# Patient Record
Sex: Female | Born: 1940 | Race: White | Hispanic: No | Smoking: Former smoker
Health system: Southern US, Community
[De-identification: ages and names within clinical notes are randomized; demographics above are authoritative.]

## PROBLEM LIST (undated history)

## (undated) DIAGNOSIS — M1990 Unspecified osteoarthritis, unspecified site: Secondary | ICD-10-CM

## (undated) DIAGNOSIS — M069 Rheumatoid arthritis, unspecified: Secondary | ICD-10-CM

## (undated) DIAGNOSIS — T4145XA Adverse effect of unspecified anesthetic, initial encounter: Secondary | ICD-10-CM

## (undated) DIAGNOSIS — M797 Fibromyalgia: Secondary | ICD-10-CM

## (undated) DIAGNOSIS — I2699 Other pulmonary embolism without acute cor pulmonale: Secondary | ICD-10-CM

## (undated) DIAGNOSIS — G4730 Sleep apnea, unspecified: Secondary | ICD-10-CM

## (undated) DIAGNOSIS — E119 Type 2 diabetes mellitus without complications: Secondary | ICD-10-CM

## (undated) DIAGNOSIS — T8859XA Other complications of anesthesia, initial encounter: Secondary | ICD-10-CM

## (undated) HISTORY — DX: Fibromyalgia: M79.7

## (undated) HISTORY — DX: Rheumatoid arthritis, unspecified: M06.9

## (undated) HISTORY — PX: TOTAL KNEE ARTHROPLASTY: SHX125

## (undated) HISTORY — PX: ABDOMINAL HYSTERECTOMY: SHX81

## (undated) HISTORY — PX: BREAST SURGERY: SHX581

## (undated) HISTORY — PX: FOOT SURGERY: SHX648

---

## 1968-03-11 HISTORY — PX: PARTIAL HYSTERECTOMY: SHX80

## 1997-09-30 ENCOUNTER — Ambulatory Visit (HOSPITAL_COMMUNITY): Admission: RE | Admit: 1997-09-30 | Discharge: 1997-09-30 | Payer: Self-pay

## 1998-01-10 ENCOUNTER — Encounter: Admission: RE | Admit: 1998-01-10 | Discharge: 1998-03-16 | Payer: Self-pay | Admitting: Internal Medicine

## 1998-03-14 ENCOUNTER — Encounter: Payer: Self-pay | Admitting: Orthopedic Surgery

## 1998-09-11 ENCOUNTER — Ambulatory Visit (HOSPITAL_COMMUNITY): Admission: RE | Admit: 1998-09-11 | Discharge: 1998-09-11 | Payer: Self-pay | Admitting: Gastroenterology

## 1998-09-11 ENCOUNTER — Encounter (INDEPENDENT_AMBULATORY_CARE_PROVIDER_SITE_OTHER): Payer: Self-pay | Admitting: Specialist

## 1998-10-03 ENCOUNTER — Encounter: Payer: Self-pay | Admitting: Internal Medicine

## 1998-10-04 ENCOUNTER — Inpatient Hospital Stay (HOSPITAL_COMMUNITY): Admission: EM | Admit: 1998-10-04 | Discharge: 1998-10-05 | Payer: Self-pay | Admitting: Internal Medicine

## 1998-10-04 ENCOUNTER — Encounter: Payer: Self-pay | Admitting: Gastroenterology

## 1999-11-26 ENCOUNTER — Encounter: Payer: Self-pay | Admitting: Family Medicine

## 1999-11-26 ENCOUNTER — Encounter: Admission: RE | Admit: 1999-11-26 | Discharge: 1999-11-26 | Payer: Self-pay | Admitting: Family Medicine

## 2000-04-07 ENCOUNTER — Encounter: Admission: RE | Admit: 2000-04-07 | Discharge: 2000-04-07 | Payer: Self-pay | Admitting: Family Medicine

## 2000-04-07 ENCOUNTER — Encounter: Payer: Self-pay | Admitting: Family Medicine

## 2000-04-15 ENCOUNTER — Encounter: Admission: RE | Admit: 2000-04-15 | Discharge: 2000-04-15 | Payer: Self-pay | Admitting: Neurological Surgery

## 2000-04-15 ENCOUNTER — Encounter: Payer: Self-pay | Admitting: Neurological Surgery

## 2000-08-08 ENCOUNTER — Encounter (HOSPITAL_COMMUNITY): Admission: RE | Admit: 2000-08-08 | Discharge: 2000-11-06 | Payer: Self-pay | Admitting: Rheumatology

## 2000-11-27 ENCOUNTER — Encounter: Admission: RE | Admit: 2000-11-27 | Discharge: 2000-11-27 | Payer: Self-pay | Admitting: Family Medicine

## 2000-11-27 ENCOUNTER — Encounter: Payer: Self-pay | Admitting: Family Medicine

## 2001-10-08 ENCOUNTER — Inpatient Hospital Stay (HOSPITAL_COMMUNITY): Admission: AD | Admit: 2001-10-08 | Discharge: 2001-10-23 | Payer: Self-pay | Admitting: Orthopedic Surgery

## 2001-10-08 ENCOUNTER — Encounter (INDEPENDENT_AMBULATORY_CARE_PROVIDER_SITE_OTHER): Payer: Self-pay | Admitting: Specialist

## 2001-10-08 ENCOUNTER — Encounter: Payer: Self-pay | Admitting: Orthopedic Surgery

## 2002-03-18 ENCOUNTER — Encounter: Admission: RE | Admit: 2002-03-18 | Discharge: 2002-03-18 | Payer: Self-pay | Admitting: Family Medicine

## 2002-03-18 ENCOUNTER — Encounter: Payer: Self-pay | Admitting: Family Medicine

## 2002-07-26 ENCOUNTER — Encounter: Admission: RE | Admit: 2002-07-26 | Discharge: 2002-07-26 | Payer: Self-pay | Admitting: Infectious Diseases

## 2002-08-25 ENCOUNTER — Encounter: Payer: Self-pay | Admitting: Infectious Diseases

## 2002-08-25 ENCOUNTER — Ambulatory Visit (HOSPITAL_COMMUNITY): Admission: RE | Admit: 2002-08-25 | Discharge: 2002-08-25 | Payer: Self-pay | Admitting: Infectious Diseases

## 2002-09-14 ENCOUNTER — Encounter: Admission: RE | Admit: 2002-09-14 | Discharge: 2002-09-14 | Payer: Self-pay | Admitting: Infectious Diseases

## 2002-09-16 ENCOUNTER — Encounter: Payer: Self-pay | Admitting: Orthopedic Surgery

## 2002-09-20 ENCOUNTER — Inpatient Hospital Stay (HOSPITAL_COMMUNITY): Admission: RE | Admit: 2002-09-20 | Discharge: 2002-09-23 | Payer: Self-pay | Admitting: Orthopedic Surgery

## 2002-09-23 ENCOUNTER — Inpatient Hospital Stay (HOSPITAL_COMMUNITY)
Admission: RE | Admit: 2002-09-23 | Discharge: 2002-09-28 | Payer: Self-pay | Admitting: Physical Medicine & Rehabilitation

## 2002-10-27 ENCOUNTER — Encounter: Admission: RE | Admit: 2002-10-27 | Discharge: 2002-10-27 | Payer: Self-pay | Admitting: Infectious Diseases

## 2002-11-22 ENCOUNTER — Inpatient Hospital Stay (HOSPITAL_COMMUNITY): Admission: RE | Admit: 2002-11-22 | Discharge: 2002-11-25 | Payer: Self-pay | Admitting: Orthopedic Surgery

## 2003-03-21 ENCOUNTER — Encounter (HOSPITAL_COMMUNITY): Admission: RE | Admit: 2003-03-21 | Discharge: 2003-06-19 | Payer: Self-pay

## 2003-06-10 ENCOUNTER — Ambulatory Visit (HOSPITAL_COMMUNITY): Admission: RE | Admit: 2003-06-10 | Discharge: 2003-06-10 | Payer: Self-pay | Admitting: Family Medicine

## 2003-06-23 ENCOUNTER — Encounter: Admission: RE | Admit: 2003-06-23 | Discharge: 2003-06-23 | Payer: Self-pay | Admitting: Gastroenterology

## 2003-09-28 ENCOUNTER — Ambulatory Visit (HOSPITAL_BASED_OUTPATIENT_CLINIC_OR_DEPARTMENT_OTHER): Admission: RE | Admit: 2003-09-28 | Discharge: 2003-09-28 | Payer: Self-pay | Admitting: Orthopedic Surgery

## 2003-09-28 ENCOUNTER — Ambulatory Visit (HOSPITAL_COMMUNITY): Admission: RE | Admit: 2003-09-28 | Discharge: 2003-09-28 | Payer: Self-pay | Admitting: Orthopedic Surgery

## 2003-12-12 ENCOUNTER — Encounter: Admission: RE | Admit: 2003-12-12 | Discharge: 2003-12-12 | Payer: Self-pay | Admitting: Otolaryngology

## 2004-01-27 ENCOUNTER — Ambulatory Visit (HOSPITAL_COMMUNITY): Admission: RE | Admit: 2004-01-27 | Discharge: 2004-01-27 | Payer: Self-pay | Admitting: Neurological Surgery

## 2004-03-10 ENCOUNTER — Inpatient Hospital Stay (HOSPITAL_COMMUNITY): Admission: RE | Admit: 2004-03-10 | Discharge: 2004-03-11 | Payer: Self-pay | Admitting: Orthopedic Surgery

## 2004-04-04 ENCOUNTER — Inpatient Hospital Stay (HOSPITAL_COMMUNITY): Admission: RE | Admit: 2004-04-04 | Discharge: 2004-04-07 | Payer: Self-pay | Admitting: Orthopedic Surgery

## 2004-05-16 ENCOUNTER — Ambulatory Visit (HOSPITAL_COMMUNITY): Admission: RE | Admit: 2004-05-16 | Discharge: 2004-05-16 | Payer: Self-pay | Admitting: Orthopedic Surgery

## 2004-08-19 ENCOUNTER — Emergency Department (HOSPITAL_COMMUNITY): Admission: EM | Admit: 2004-08-19 | Discharge: 2004-08-19 | Payer: Self-pay | Admitting: *Deleted

## 2004-10-03 ENCOUNTER — Ambulatory Visit (HOSPITAL_COMMUNITY): Admission: RE | Admit: 2004-10-03 | Discharge: 2004-10-03 | Payer: Self-pay | Admitting: Orthopedic Surgery

## 2004-12-03 ENCOUNTER — Ambulatory Visit (HOSPITAL_COMMUNITY): Admission: RE | Admit: 2004-12-03 | Discharge: 2004-12-03 | Payer: Self-pay

## 2004-12-18 ENCOUNTER — Encounter (HOSPITAL_COMMUNITY): Admission: RE | Admit: 2004-12-18 | Discharge: 2004-12-18 | Payer: Self-pay

## 2004-12-28 ENCOUNTER — Inpatient Hospital Stay (HOSPITAL_COMMUNITY): Admission: EM | Admit: 2004-12-28 | Discharge: 2005-01-03 | Payer: Self-pay | Admitting: Emergency Medicine

## 2004-12-28 ENCOUNTER — Encounter (INDEPENDENT_AMBULATORY_CARE_PROVIDER_SITE_OTHER): Payer: Self-pay | Admitting: Specialist

## 2005-01-22 ENCOUNTER — Encounter: Admission: RE | Admit: 2005-01-22 | Discharge: 2005-01-22 | Payer: Self-pay

## 2005-03-11 HISTORY — PX: ANKLE FRACTURE SURGERY: SHX122

## 2005-04-03 ENCOUNTER — Inpatient Hospital Stay (HOSPITAL_COMMUNITY): Admission: EM | Admit: 2005-04-03 | Discharge: 2005-04-08 | Payer: Self-pay | Admitting: *Deleted

## 2005-06-21 ENCOUNTER — Emergency Department (HOSPITAL_COMMUNITY): Admission: EM | Admit: 2005-06-21 | Discharge: 2005-06-21 | Payer: Self-pay | Admitting: Emergency Medicine

## 2005-06-23 ENCOUNTER — Encounter: Payer: Self-pay | Admitting: Emergency Medicine

## 2005-06-23 ENCOUNTER — Inpatient Hospital Stay (HOSPITAL_COMMUNITY): Admission: EM | Admit: 2005-06-23 | Discharge: 2005-06-28 | Payer: Self-pay | Admitting: Internal Medicine

## 2005-06-25 ENCOUNTER — Encounter (INDEPENDENT_AMBULATORY_CARE_PROVIDER_SITE_OTHER): Payer: Self-pay | Admitting: *Deleted

## 2005-07-09 ENCOUNTER — Ambulatory Visit (HOSPITAL_COMMUNITY): Admission: RE | Admit: 2005-07-09 | Discharge: 2005-07-09 | Payer: Self-pay | Admitting: Internal Medicine

## 2005-07-17 ENCOUNTER — Encounter (HOSPITAL_COMMUNITY): Admission: RE | Admit: 2005-07-17 | Discharge: 2005-10-15 | Payer: Self-pay

## 2005-09-17 ENCOUNTER — Encounter: Admission: RE | Admit: 2005-09-17 | Discharge: 2005-09-17 | Payer: Self-pay | Admitting: Neurology

## 2005-10-11 ENCOUNTER — Encounter: Payer: Self-pay | Admitting: Internal Medicine

## 2005-10-25 ENCOUNTER — Encounter: Admission: RE | Admit: 2005-10-25 | Discharge: 2005-10-25 | Payer: Self-pay | Admitting: Family Medicine

## 2006-01-13 ENCOUNTER — Encounter (HOSPITAL_COMMUNITY): Admission: RE | Admit: 2006-01-13 | Discharge: 2006-04-13 | Payer: Self-pay

## 2006-07-21 ENCOUNTER — Inpatient Hospital Stay (HOSPITAL_COMMUNITY): Admission: EM | Admit: 2006-07-21 | Discharge: 2006-07-23 | Payer: Self-pay | Admitting: *Deleted

## 2006-08-26 ENCOUNTER — Encounter: Admission: RE | Admit: 2006-08-26 | Discharge: 2006-08-26 | Payer: Self-pay | Admitting: Family Medicine

## 2006-09-23 ENCOUNTER — Emergency Department (HOSPITAL_COMMUNITY): Admission: EM | Admit: 2006-09-23 | Discharge: 2006-09-23 | Payer: Self-pay | Admitting: Emergency Medicine

## 2006-09-29 ENCOUNTER — Encounter (HOSPITAL_COMMUNITY): Admission: RE | Admit: 2006-09-29 | Discharge: 2006-12-15 | Payer: Self-pay

## 2006-12-02 ENCOUNTER — Other Ambulatory Visit: Admission: RE | Admit: 2006-12-02 | Discharge: 2006-12-02 | Payer: Self-pay | Admitting: Family Medicine

## 2007-02-25 ENCOUNTER — Ambulatory Visit (HOSPITAL_BASED_OUTPATIENT_CLINIC_OR_DEPARTMENT_OTHER): Admission: RE | Admit: 2007-02-25 | Discharge: 2007-02-26 | Payer: Self-pay | Admitting: Orthopedic Surgery

## 2007-02-27 ENCOUNTER — Encounter: Payer: Self-pay | Admitting: Internal Medicine

## 2007-05-07 ENCOUNTER — Encounter: Admission: RE | Admit: 2007-05-07 | Discharge: 2007-05-07 | Payer: Self-pay | Admitting: Family Medicine

## 2007-05-13 DIAGNOSIS — M069 Rheumatoid arthritis, unspecified: Secondary | ICD-10-CM | POA: Insufficient documentation

## 2007-05-13 DIAGNOSIS — M810 Age-related osteoporosis without current pathological fracture: Secondary | ICD-10-CM | POA: Insufficient documentation

## 2007-05-13 DIAGNOSIS — R197 Diarrhea, unspecified: Secondary | ICD-10-CM | POA: Insufficient documentation

## 2007-05-13 DIAGNOSIS — K219 Gastro-esophageal reflux disease without esophagitis: Secondary | ICD-10-CM | POA: Insufficient documentation

## 2007-05-14 ENCOUNTER — Ambulatory Visit: Payer: Self-pay | Admitting: Internal Medicine

## 2007-05-14 DIAGNOSIS — G4730 Sleep apnea, unspecified: Secondary | ICD-10-CM | POA: Insufficient documentation

## 2007-05-14 DIAGNOSIS — J45909 Unspecified asthma, uncomplicated: Secondary | ICD-10-CM | POA: Insufficient documentation

## 2007-05-15 ENCOUNTER — Encounter: Admission: RE | Admit: 2007-05-15 | Discharge: 2007-05-15 | Payer: Self-pay | Admitting: Internal Medicine

## 2007-05-29 ENCOUNTER — Ambulatory Visit: Payer: Self-pay | Admitting: Internal Medicine

## 2007-06-12 ENCOUNTER — Encounter: Payer: Self-pay | Admitting: Internal Medicine

## 2007-06-18 ENCOUNTER — Ambulatory Visit: Payer: Self-pay | Admitting: Internal Medicine

## 2007-06-18 DIAGNOSIS — R1311 Dysphagia, oral phase: Secondary | ICD-10-CM | POA: Insufficient documentation

## 2007-07-09 ENCOUNTER — Ambulatory Visit: Payer: Self-pay | Admitting: Vascular Surgery

## 2007-07-09 ENCOUNTER — Encounter (INDEPENDENT_AMBULATORY_CARE_PROVIDER_SITE_OTHER): Payer: Self-pay | Admitting: Emergency Medicine

## 2007-07-09 ENCOUNTER — Observation Stay (HOSPITAL_COMMUNITY): Admission: EM | Admit: 2007-07-09 | Discharge: 2007-07-10 | Payer: Self-pay | Admitting: Emergency Medicine

## 2007-07-10 ENCOUNTER — Telehealth (INDEPENDENT_AMBULATORY_CARE_PROVIDER_SITE_OTHER): Payer: Self-pay | Admitting: *Deleted

## 2007-07-10 ENCOUNTER — Encounter (INDEPENDENT_AMBULATORY_CARE_PROVIDER_SITE_OTHER): Payer: Self-pay | Admitting: Internal Medicine

## 2007-07-15 ENCOUNTER — Telehealth (INDEPENDENT_AMBULATORY_CARE_PROVIDER_SITE_OTHER): Payer: Self-pay | Admitting: *Deleted

## 2007-07-20 ENCOUNTER — Telehealth (INDEPENDENT_AMBULATORY_CARE_PROVIDER_SITE_OTHER): Payer: Self-pay | Admitting: *Deleted

## 2007-07-23 ENCOUNTER — Ambulatory Visit (HOSPITAL_COMMUNITY): Admission: RE | Admit: 2007-07-23 | Discharge: 2007-07-23 | Payer: Self-pay | Admitting: Internal Medicine

## 2007-07-27 ENCOUNTER — Telehealth (INDEPENDENT_AMBULATORY_CARE_PROVIDER_SITE_OTHER): Payer: Self-pay | Admitting: *Deleted

## 2007-07-28 ENCOUNTER — Telehealth (INDEPENDENT_AMBULATORY_CARE_PROVIDER_SITE_OTHER): Payer: Self-pay | Admitting: *Deleted

## 2007-07-30 ENCOUNTER — Encounter (HOSPITAL_COMMUNITY): Admission: RE | Admit: 2007-07-30 | Discharge: 2007-10-28 | Payer: Self-pay

## 2007-11-03 ENCOUNTER — Encounter: Admission: RE | Admit: 2007-11-03 | Discharge: 2007-11-03 | Payer: Self-pay | Admitting: Family Medicine

## 2008-01-06 ENCOUNTER — Encounter: Admission: RE | Admit: 2008-01-06 | Discharge: 2008-01-06 | Payer: Self-pay | Admitting: Orthopedic Surgery

## 2008-02-02 ENCOUNTER — Ambulatory Visit (HOSPITAL_COMMUNITY): Admission: RE | Admit: 2008-02-02 | Discharge: 2008-02-02 | Payer: Self-pay

## 2008-02-15 ENCOUNTER — Encounter (HOSPITAL_COMMUNITY): Admission: RE | Admit: 2008-02-15 | Discharge: 2008-03-10 | Payer: Self-pay

## 2008-02-22 ENCOUNTER — Encounter: Admission: RE | Admit: 2008-02-22 | Discharge: 2008-02-22 | Payer: Self-pay | Admitting: Neurological Surgery

## 2008-06-06 ENCOUNTER — Other Ambulatory Visit: Admission: RE | Admit: 2008-06-06 | Discharge: 2008-06-06 | Payer: Self-pay | Admitting: Obstetrics and Gynecology

## 2008-09-19 ENCOUNTER — Ambulatory Visit (HOSPITAL_COMMUNITY): Admission: RE | Admit: 2008-09-19 | Discharge: 2008-09-19 | Payer: Self-pay | Admitting: Unknown Physician Specialty

## 2008-10-03 ENCOUNTER — Encounter (HOSPITAL_COMMUNITY): Admission: RE | Admit: 2008-10-03 | Discharge: 2008-12-02 | Payer: Self-pay | Admitting: Unknown Physician Specialty

## 2008-11-18 ENCOUNTER — Encounter: Admission: RE | Admit: 2008-11-18 | Discharge: 2008-11-18 | Payer: Self-pay | Admitting: Internal Medicine

## 2009-02-10 ENCOUNTER — Encounter (HOSPITAL_COMMUNITY)
Admission: RE | Admit: 2009-02-10 | Discharge: 2009-05-11 | Payer: Self-pay | Source: Home / Self Care | Admitting: Unknown Physician Specialty

## 2009-08-30 ENCOUNTER — Encounter (HOSPITAL_COMMUNITY): Admission: RE | Admit: 2009-08-30 | Discharge: 2009-11-28 | Payer: Self-pay | Admitting: Unknown Physician Specialty

## 2009-11-20 ENCOUNTER — Encounter: Admission: RE | Admit: 2009-11-20 | Discharge: 2009-11-20 | Payer: Self-pay | Admitting: Internal Medicine

## 2010-01-26 ENCOUNTER — Encounter (HOSPITAL_COMMUNITY)
Admission: RE | Admit: 2010-01-26 | Discharge: 2010-04-10 | Payer: Self-pay | Source: Home / Self Care | Attending: Unknown Physician Specialty | Admitting: Unknown Physician Specialty

## 2010-04-01 ENCOUNTER — Encounter: Payer: Self-pay | Admitting: Internal Medicine

## 2010-04-01 ENCOUNTER — Encounter: Payer: Self-pay | Admitting: Family Medicine

## 2010-07-24 NOTE — H&P (Signed)
[name], [name] NO.:  [id number]   MEDICAL RECORD NO.:  [id number]          PATIENT TYPE:  OBV   LOCATION:  0110                         FACILITY:  [facility]   PHYSICIAN:  Ramiro Harvest, MD    DATE OF BIRTH:  1940-08-17   DATE OF ADMISSION:  07/09/2007  DATE OF DISCHARGE:                              HISTORY & PHYSICAL   ATTENDING PHYSICIAN:  Dr. Ramiro Harvest.   PRIMARY CARE PHYSICIAN:  Dr. Maurice Small.   PULMONOLOGIST:  Dr. Maple Hudson.   ORTHOPEDIST:  Dr. Lestine Box.   HISTORY OF PRESENT ILLNESS:  [name] was a 70 year old white female  with a history of rheumatoid arthritis, gastroesophageal reflux disease,  and multiple surgeries, also a history of asthma, who presents from the  orthopedist office, Dr. Ashok Norris office, with complaints of a history  of mid sternal chest pain occurring at rest, constant in nature, and  described as a heaviness.  The patient also with worsening heartburn,  increased burping, and wheezes as well.  The patient denies any  shortness of breath, no palpitations, no syncope, no nausea, no  vomiting, no diaphoresis, no fever, no chills, no abdominal pain, no  hematemesis, no hematochezia.  No other associated symptoms.  The  patient also states that she has been having some a left-sided  nonradiating substernal chest pain around the rib cage for the past two  days as well with no other associated symptoms.  The patient also  complaining of right calf tenderness for past four months since surgery  per Dr. Lestine Box to the right calf area/gastrocnemius with intermittent  swelling and some warmth.  The patient went for follow-up with Dr.  Lestine Box and was sent to the ED for further evaluation for complaints of  her right calf pain and chest pain.  In the ED, labs were obtained  including elevated D-dimer, negative point of care cardiac markers,  negative EKG with no EKG changes.  CT of the chest was negative for PE.  Bilateral  lower extremity Dopplers done were also negative for DVT per  preliminary reading.  We were asked to admit patient for further  evaluation and management.   ALLERGIES:  DEMEROL CAUSES NAUSEA, VOMITING, HALLUCINATIONS; ALSO  ALLERGIC TO PENICILLIN CAUSING HIVES AND SULFA CAUSES A GI UPSET.   PAST MEDICAL HISTORY:  1. History of rheumatoid arthritis since 1986.  2. History of left rib fractures.  3. Gastroesophageal reflux disease.  4. Asthma.  5. Depression.  6. History of peripheral neuropathy.  7. History of acute renal failure in the past.  8. History of right second and fifth metatarsalgia.  9. History of right second and fifth hammer toes.  10.History of right gastrocnemius.  11.Status post right second to fourth metatarsal head resection in      December 2008.  12.Status post right fifth metatarsal head resection.  13.Status post right gastrocnemius slide.  14.Status post right second through fifth toes MTP joint dorsal      capsulotomy with collateral release.  15.Status post right second and fifth toe proximal phalanx head  resection.  16.Status post right second and fourth toes EDD to the EDL tendon      transfer.  17.Status post right fifth toes EDLC lengthening.  18.EDLD lengthening.  All these toe surgeries were done by Dr. Lestine Box      in December 2008.  19.Obstructive sleep apnea.  20.Chronic anemia.  21.Status post bilateral knee surgery.  22.Status post bilateral mammoplasties in the 1970s status post      removal.  23.Status post left ankle surgery.  24.Status post staph infections in the feet in the past.  25.Hiatal hernia.  26.Osteoarthritis.   MEDICATIONS:  1. Plaquenil 200 mg daily.  2. Fosamax 70 mg weekly.  3. Multivitamin daily.  4. Calcium vitamin D 500 mg daily.  5. Protonix 40 mg daily.  6. Prednisone 10 mg daily.  7. Folbic 1 tablet daily.  8. MSIR 30 mg p.o. q.6h. p.r.n.  9. Fish oil dose unknown.   SOCIAL HISTORY:  The patient lives  in [place], is divorced.  No  tobacco use.  No alcohol use.  No IV drug use.  Two children, one son  with a hemochromatosis and a daughter with Wilson's disease.   FAMILY HISTORY:  Mother alive age 67 with diabetes, neuropathy and  hypertension.  Father deceased age 104 from a questionable blood  disorder.  The patient has two half sisters, one sister with mental  disease and aneurysm.  Another sister is healthy.   REVIEW OF SYSTEMS:  As per HPI.  Also positive for occasional dysphagia.  Also positive for occasional wheezing at night.  Otherwise, per HPI.   PHYSICAL EXAMINATION:  Temperature 98.5, blood pressure 134/79, pulse of  100, respiratory rate 27, 94% on room air.  GENERAL:  The patient is in no apparent distress on the gurney reading a  book.  HEENT:  Normocephalic, atraumatic.  Pupils equal, round and reactive to  light.  Extraocular movements intact.  Oropharynx is clear, moist.  No  lesions, no exudates.  NECK:  Supple.  No lymphadenopathy.  RESPIRATORY:  Lungs are clear to auscultation bilaterally.  No rhonchi.  No crackles.  No wheezes.  CARDIOVASCULAR:  Tachycardiac, regular rhythm.  No murmurs, rubs or  gallops.  Some tenderness to palpation in the left substernal region and  the mid sternal region.  ABDOMEN:  Is soft, obese, nontender, nondistended.  Positive bowel  sounds.  EXTREMITIES:  No clubbing, cyanosis or edema.  Deformities of both  bilateral hands and bilateral lower extremities/feet secondary to  rheumatoid arthritis.  NEUROLOGICAL:  The patient alert and oriented x3.  Cranial nerves II-XII  are grossly intact.  No focal deficits.  Sensation is intact.   LABORATORY DATA:  CBC:  White count 12.7, hemoglobin 13.2, hematocrit  40.3, platelets 296, ANC of 9.0. Sodium 139, potassium 4.1, chloride  104, bicarb 24, BUN 16, creatinine 0.97, glucose 130, calcium 8.8,  bilirubin 0.9, direct bilirubin 0.1, indirect 0.8 alkaline phosphatase  85, AST 31, ALT  36, albumin 3.3, protein of 6.6.  D-dimer 2.98, lipase  of 31.  Point of care cardiac markers:  CK-MB 2.2-1.4, troponin I less  than 0.05 to less than 0.05, myoglobin 65-60.8.  Chest x-ray:  No active  disease.  CT angio:  PE, no evidence of aortic dissection, no major  atelectasis or pneumonia.  There is some dependent atelectasis or edema  in the lungs along with small pleural effusion, probable goiter.  EKG:  Normal sinus rhythm with possible PACs, left axis deviation, no  ST-T  wave changes.  Lower extremity Dopplers preliminary is negative for DVT  or superficial vein thrombosis or Baker's cyst.  There appears to be an  area on the mid posterior calf that may represent an old hematoma or  collection of fluid.   ASSESSMENT AND PLAN:  Ms. [name] is a 70 year old female, history of  gastroesophageal reflux disease, rheumatoid arthritis, history of left  rib fractures in the past, history of asthma, and multiple surgeries to  her toes as well as a gastrocnemius on the right who presents with chest  pain.   1. Chest pain.  Differential includes acute coronary syndrome versus      musculoskeletal in nature versus gastrointestinal in nature.  Will      cycle cardiac enzymes q.8h x3.  We will check an EKG in the      morning.  Check a fasting lipid panel.  Check a TSH.  Check a      magnesium level.  Check a 2-D echocardiogram to rule out LV      dysfunction.  The patient is on aspirin 325 mg x1 now.  Place      patient on a beta blocker and Lovenox.  Will place the patient on a      scheduled ibuprofen 600 mg p.o. q.i.d. for 7 days as might have a      musculoskeletal component to it.  Will place the patient on      Protonix 40 mg p.o. b.i.d., and if cardiac enzymes are positive,      will consult with cardiology for further evaluation and management.      Will monitor for now.  2. Gastroesophageal reflux disease.  Protonix 40 mg p.o. b.i.d.  3. Asthma, stable.  Albuterol/Atrovent  nebulizers.  4. Rheumatoid arthritis.  Continue home dose Plaquenil, prednisone and      MSIR.  5. Depression.  6. Obstructive sleep apnea.  7. Chronic anemia.  Continue home dose Folbic 1 tablet daily.  8. Multiple toe surgeries.  9. Hiatal hernia.  Protonix b.i.d.  10.Prophylaxis.  Protonix for GI prophylaxis.  Lovenox for DVT      prophylaxis.   It was a pleasure taking care of Ms. Shaya Reddick.      Ramiro Harvest, MD  Electronically Signed     DT/MEDQ  D:  07/09/2007  T:  07/09/2007  Job:  [id number]   cc:   Gretta Arab. Valentina Lucks, M.D.  Fax: [phone]   Leonides Grills, M.D.  Fax: [phone]

## 2010-07-24 NOTE — Op Note (Signed)
[name], [name]                   ACCOUNT NO.:  [id number]   MEDICAL RECORD NO.:  [id number]          PATIENT TYPE:  AMB   LOCATION:  DSC                          FACILITY:  [facility]   PHYSICIAN:  Leonides Grills, M.D.     DATE OF BIRTH:  08/07/1940   DATE OF PROCEDURE:  02/25/2007  DATE OF DISCHARGE:                               OPERATIVE REPORT   PREOPERATIVE DIAGNOSES:  1. Right second through fifth metatarsalgia.  2. Right second through fifth hammertoes.  3. Right tight gastroc.   POSTOPERATIVE DIAGNOSES:  1. Right second through fifth metatarsalgia.  2. Right second through fifth hammertoes.  3. Right tight gastroc.   OPERATION:  1. Right second through fourth metatarsal head resection  2. Right fifth metatarsal head resection.  3. Right gastroc slide.  4. Right second through fifth toes metatarsophalangeal joint dorsal      capsulotomy with collateral release.  5. Right second through fifth toe proximal phalanx head resections.  6. Right second through fourth toes EDB to EDL tendon transfer.  7. Right fifth toes EDL Z lengthening.   ANESTHESIA:  General with block.   SURGEON:  Leonides Grills, M.D.   ASSISTANT:  Evlyn Kanner, P.A.-C.   ESTIMATED BLOOD LOSS:  Minimal.   TOURNIQUET TIME:  Approximately an hour and 20 minutes.   COMPLICATIONS:  None.   DISPOSITION:  Stable to PR.   INDICATIONS:  A 70 year old female who has had longstanding right  forefoot pain secondary to rheumatoid arthritis that is interfering with  her life  to the point he cannot do what she wants to do.  She was  consented for the above procedure.  All risks which include infection,  nerve or vessel injury, persistent pain, worse pain, prolonged recovery,  persistent deformity, recurrence of deformity, and ischemia of her toes  were all explained.  Questions were encouraged and answered.   OPERATION:  The patient was brought to the operating room, placed in  supine position.  After adequate  general endotracheal tube anesthesia  was administered as well as Ancef 1 gram IV piggyback, right lower  extremity was then prepped and draped in a sterile manner over a  proximally placed thigh tourniquet.  We started the procedure with a  longitudinal incision over the medial aspect of gastrocnemius muscle  tendinous junction.  Dissection was carried down through skin.  Hemostasis was obtained.  Fascia was opened in line with the incision.  Conjoined region was then developed between the gastroc soleus muscular.  Sural nerves identified and elevated off the posterior aspect of the  gastrocnemius tendon.  This was protected throughout the case.  The  gastrocnemius was then released with curved Mayo scissors, this had  excellent release of the tight gastroc.  The area was copiously  irrigated with normal saline.  Subcutaneous tissue was closed with 3-0  Vicryl.  Skin was closed 4-0 Monocryl subcuticular stitch.  Steri-Strips  were applied.  Limb was then gravity exsanguinated.  Tourniquet was  elevated to 290 mmHg, and a longitudinal incision over the dorsal aspect  of the  right second toe was then made.  Dissection was carried down  through skin.  Hemostasis was obtained.  EDB and EDL tendons were  identified.  EDL was tenotomized proximal to medial and brevis distal to  lateral and retracted out of harm's way.  The distal aspect of the  proximal phalanx was then skeletonized, and the head was then removed  with a rongeur followed by a bone cutter.  This cut was made  perpendicular to long axis of the proximal phalanx.  MTP joint dorsal  capsulotomy with collateral release was then performed, and due to the  severity of the deformity, the soft tissue was released around the base  of proximal phalanx.  We then dissected out the second metatarsal head,  and protecting the soft tissues peripherally, head was then removed with  a sagittal saw.  Once this was removed, this was cut on an  oblique  distal lateral dorsal to proximal plantar medial direction.  The same  exact procedure was performed for the third, fourth and fifth toes,  respectively, through separate incisions.  We then placed a K-wire  through the middle and distal phalanx antegrade, reduced the PIP joint  and fired this retrograde with the toe reduced and into the second  metatarsal shaft.  This was again done for third, fourth and fifth toes,  respectively.  We then performed a transfer of the EDB to EDL tendon  using 3-0 PDS stitch for the second, third and fourth toes.  For the  fifth toe, the EDL, we performed a Z lengthening with a 15 blade scalpel  and then repaired this with a 3-0 PDS stitch.  This had an Interior and spatial designer.  Tourniquet was deflated.  Hemostasis was obtained.  Toes pinked  up nicely.  Skin relieving incisions were made on either side of the K-  wire.  K-wires were bent, cut and capped.  Subcutaneous tissue was  closed with 3-0 Vicryl.  Skin was closed 4-0 nylon over all wounds.  Sterile dressing was applied.  Cam walker boot was applied.  The patient  was stable to PR.      Leonides Grills, M.D.  Electronically Signed     PB/MEDQ  D:  02/25/2007  T:  02/26/2007  Job:  [id number]

## 2010-07-24 NOTE — H&P (Signed)
[name], [name] NO.:  [id number]   MEDICAL RECORD NO.:  [id number]          PATIENT TYPE:  EMS   LOCATION:  MAJO                         FACILITY:  [facility]   PHYSICIAN:  Ellie Lunch, M.D.      DATE OF BIRTH:  09/23/1940   DATE OF ADMISSION:  07/21/2006  DATE OF DISCHARGE:                              HISTORY & PHYSICAL   PRIMARY CARE PHYSICIAN:  Dr. Maurice Small.   ADMITTING DIAGNOSIS:  Right foot erythema, pain, and swelling.  Most  likely cellulitis.   HISTORY OF PRESENT ILLNESS:  Miss [name] is a pleasant 70 year old white  female with a past medical history as indicated below, who has come to  the emergency room because of sudden onset of right foot pain, swelling,  and redness that she first noticed when she woke up this morning.  She  has a history of two staph infections in the past.  The most recent one  being about five years ago which required an incision and drainage and  IV antibiotics for six weeks at home.  Most likely a MRSA infection.  She denies any history of trauma with the left foot.  She denies any  fevers or chills.  She recently had a pneumonia that was treated as an  outpatient with seven days of antibiotics, the last was seven days ago.  She is currently taking Bactrim for a urinary tract infection and is on  the fourth day of that antibiotic.  She does complain of some chest  pressure, which comes on and off, but is not associated with nausea,  vomiting, diaphoresis, palpitations, or syncope.   PAST MEDICAL HISTORY:  1. History of rheumatoid arthritis since 1986.  2. History of left rib fractures.  3. History of reflux.  4. History of depression.  5. History of peripheral neuropathy.  6. History of acute renal failure in the past.   CURRENT MEDICATIONS:  1. Plaquenil 200 mg p.o. b.i.d.  2. MS Contin 100 mg p.o. every day.  3. Fluoxetine 40 mg daily.  4. Potassium 1 tablet p.o. daily.  5. Prednisone 10 mg p.o. daily.  6.  MSIR 30 mg p.o. every six hours p.r.n. pain.  7. Bactrim day 4.   ALLERGIES:  1. DEMEROL - NAUSEA AND VOMITING.  2. PENICILLIN - HIVES.  3. SULFA - HIVES.   SOCIAL HISTORY:  She does not give any history of smoking, alcohol use,  or drug use.   REVIEW OF SYSTEMS:  As per HPI.   PHYSICAL EXAMINATION:  VITAL SIGNS:  On admission, temperature 99.2,  blood pressure 120/65, pulse 112, respirations 20, pulse ox 94% on room  air.  GENERAL EXAM:  The patient appears in no acute distress.  HEENT exam:  Head normocephalic, atraumatic.  NECK:  Supple.  No JVD.  No bruits.  CARDIOVASCULAR:  Regular rate and rhythm without murmurs.  CHEST:  Clear to auscultation bilaterally.  No wheezes, rales, or  rhonchi.  ABDOMEN:  Soft, nontender, nondistended.  Positive bowel sounds.  EXTREMITIES:  The patient does have deformities of  rheumatoid arthritis  in both hands as well as legs.  She has a huge area of redness,  tenderness to palpation, and swelling located on the dorsum of the right  foot that extends behind the medial malleolus.  She has good peripheral  pulses.   ADMISSION LABS:  CBC:  White count 12.8 with an ENC of 10.3, hemoglobin  11.1, platelets 253,000.  Creatinine 1.2, sodium 133, potassium 4.2,  chloride 108, glucose 107, BUN 26.   Chest x-ray shows no active cardiopulmonary disease.   PLAN:  1. Right leg cellulitis, most likely secondary to MRSA, given history      of MRSA infections in the past.  We will go ahead and start her on      vancomycin.  The patient has already received blood cultures.      Monitor her.  We will also get a right foot x-ray to make sure she      does not have any evidence of osteomyelitis.  2. Rheumatoid arthritis.  Will continue her home regimen of Plaquenil,      prednisone, MS Contin, and MSIR.  3. Urinary tract infection.  We will go ahead and culture it.  If it      is positive, will treat accordingly, although patient has already      received  three days of Bactrim therapy.  4. Right upper quadrant pain.  Will go ahead and get LFTs and get an      ultrasound of the gallbladder if any of her LFTs are elevated.      Ellie Lunch, M.D.  Electronically Signed     BP/MEDQ  D:  07/21/2006  T:  07/21/2006  Job:  [id number]   cc:   Gretta Arab. Valentina Lucks, M.D.

## 2010-07-27 NOTE — Discharge Summary (Signed)
[name], [name]                   ACCOUNT NO.:  [id number]   MEDICAL RECORD NO.:  [id number]          PATIENT TYPE:  INP   LOCATION:  6708                         FACILITY:  [facility]   PHYSICIAN:  Hollice Espy, M.D.DATE OF BIRTH:  January 14, 1941   DATE OF ADMISSION:  06/23/2005  DATE OF DISCHARGE:  06/28/2005                                 DISCHARGE SUMMARY   CONSULTS:  Dr. Everette Rank   PRIMARY CARE PHYSICIAN:  Dr. Maurice Small   DISCHARGE DIAGNOSES:  1.  Fall with rib fracture.  2.  Post obstructive pneumonia into the right upper and lower lobe.  3.  Tobacco abuse.  4.  History of rheumatoid arthritis.  5.  Recent fall with left-sided rib fractures.  6.  Negative cardiac adenosine stress test.  7.  Gastroesophageal reflux disease.  8.  Depression.  9.  History of peripheral neuropathy.  10. Acute renal failure, now resolved.   DISCHARGE MEDICATIONS:  1.  Patient continue her previous medications of Plaquenil 200 mg p.o.      b.i.d.  2.  Fluoxetine 40 mg p.o. daily.  3.  Protonix 40 p.o. daily.  4.  Neurontin 300 p.o. q.h.s.  5.  Calcium one tablet p.o. daily.  6.  Wellbutrin 300 p.o. daily.  7.  Prednisone 10 p.o. daily to be resumed once patient has completed her      antibiotic regimen.  8.  Morphine sulfate.  MS-Contin 100 mg p.o. daily.  9.  Loxitane 40 p.o. daily.  10. MS-IR 30 mg p.o. four times a day.  11. Avelox 400 mg p.o. daily x4 more days.   HISTORY OF PRESENT ILLNESS:  Patient is a pleasant 70 year old white female  who one day prior to admission had a fall leading to left rib fractures.  She continued to have problems with weakness and shortness of breath.  She  came into the emergency room on April 15 with a white count of 24.  A chest  x-ray showed evidence of pneumonia.  She also had evidence of acute renal  failure with a creatinine 2.7.  Her baseline was 1.1.  She was admitted,  started on IV fluids, abnormal troponin of 0.19.  Patient was  started on IV  antibiotics, IV fluid.  Patient's daughter who is a nurse reviewed her  troponins and was concerned patient may be having evidence of ischemia and  requested a cardiology consult.  2-D echocardiogram was performed on the  patient which was found to have a slightly lower ejection fraction of 45-  50%.  In discussion with the cardiologists Dr. Eldridge Dace who felt this was  under read and likely she had a more higher ejection fraction of 50-55%.  Nevertheless, a stress test was done on the patient on June 26, 2005 which  was found to be unremarkable and normal and by nuclear imaging her ejection  fraction was normal.  In addition, patient's white count following  administration of continued IV fluids by April 17 had completely resolved  back to normal.  Her white count had decreased down after  several days of  antibiotics and on April 19 her white count was within the normal range at  9.8.  Patient has been afebrile.  Plan for the patient after being evaluated  by physical therapy is that she needs short-term skilled nursing.  Her  daughters have been pushing the patient to go to an assisted living versus  nursing home but the patient for now is not ready for that and agreed for  patient to go to a skilled nursing facility for short-term rehabilitation.  She will continue on antibiotics, Avelox for four more days.  She will  continue the rest of her other medications.  Patient's overall disposition  is improved.  Activity will be as per the skilled nursing facility.   DISCHARGE DIET:  Regular diet.   FOLLOW-UP APPOINTMENTS:  Dr. Valentina Lucks in one to two weeks post discharge from  rehabilitation.      Hollice Espy, M.D.  Electronically Signed     SKK/MEDQ  D:  06/27/2005  T:  06/28/2005  Job:  [id number]

## 2010-07-27 NOTE — Consult Note (Signed)
NAMEANELIS, HRIVNAK NO.:  [id number]   MEDICAL RECORD NO.:  [id number]          PATIENT TYPE:  INP   LOCATION:  3304                         FACILITY:  [facility]   PHYSICIAN:  Corky Crafts, MDDATE OF BIRTH:  04/01/1940   DATE OF CONSULTATION:  06/23/2005  DATE OF DISCHARGE:                                   CONSULTATION   PRIMARY CARE PHYSICIAN:  Gretta Arab. Valentina Lucks, M.D.   REASON FOR CONSULTATION:  Shortness of breath and elevated troponin.   HISTORY OF PRESENT ILLNESS:  The patient is a 70 year old woman with severe  rheumatoid arthritis who was admitted to the hospital after falling at her  home.  Upon arrival, she was lethargic.  She was found to have pneumonia as  well as renal insufficiency with a creatinine of 2.7.  Her baseline  creatinine is 1.1.  A troponin was also drawn and found to be 0.19.  We were  asked to evaluate.  The patient denied chest pain.  She did have some  shortness of breath a few days ago after she fractured some ribs.  Otherwise, her biggest complaint is chronic pain in her joints.   PAST MEDICAL HISTORY:  1.  Rheumatoid arthritis.  2.  GERD.  3.  Question recent GI bleed.  4.  Anemia.  5.  Depression.  6.  Obstructive sleep apnea.   PAST SURGICAL HISTORY:  1.  Recent ankle surgery and left foot surgery.  2.  Bilateral knee surgery.   ALLERGIES:  SULFA, PENICILLIN, AND DEMEROL.   CURRENT MEDICATIONS:  1.  Plaquenil.  2.  Morphine.  3.  Prozac.  4.  Protonix.  5.  Neurontin.  6.  Calcium.  7.  Prednisone.   FAMILY HISTORY:  Noncontributory.   SOCIAL HISTORY:  The patient does not smoke, drink, or use any drugs.  She  lives by herself.   REVIEW OF SYSTEMS:  Significant for diffuse pain and fatigue.  She denies  any chest pain.  She does not have any shortness of breath currently.  She  denies dysuria.  She also has had a recent fall.  She has generalized  weakness, no focal weakness.  All other systems  negative.   PHYSICAL EXAMINATION:  VITAL SIGNS:  Blood pressure 104/52, heart rate now  94, initially was 135.  GENERAL:  The patient is awake and alert and in no apparent distress.  NECK:  No carotid bruits.  No JVD.  Supple.  CARDIOVASCULAR:  Regular rate and rhythm.  S1 and S2.  LUNGS:  Clear to auscultation anteriorly.  EXTREMITIES:  No edema.  1+ dorsalis pedis pulses bilaterally.  Well-healed  scars on both feet.  NEUROLOGIC:  No focal deficits.   LABORATORY DATA:  White blood cell count 24,000, hematocrit 39.8.  Creatinine 2.7.  CK 212, MB 5.2, troponin 0.19.  Second set:  CK 230, MB  5.4, troponin 0.11.   ECG showed sinus tachycardia with occasional PVCs.  There were no  significant ST segment changes.   ASSESSMENT:  A 70 year old woman admitted with altered mental  status,  pneumonia, and acute renal failure.   PLAN:  1.  Cardiac.  I doubt this slightly elevated troponin represents a plaque      rupture or acute coronary syndrome.  The increased troponin is probably      secondary to acute renal failure.  I would not use antithrombotic      medications given her recent coffee-ground emesis and lack of a GI      evaluation.  I would check an echocardiogram to evaluate left      ventricular function.  2.  Continue rehydration with IV fluids.  The patient is tolerating her high      dose of IV fluids well.  3.  The daughter is also concerned about her GI status.  At some point, this      will need to be evaluated.  Apparently, the patient does take NSAIDs      because of pain.  I would be hesitant to use any type of      anticoagulation, especially given the low probability of acute coronary      syndrome.      Corky Crafts, MD  Electronically Signed     JSV/MEDQ  D:  06/23/2005  T:  06/24/2005  Job:  [id number]

## 2010-07-31 NOTE — Op Note (Signed)
NAME:  [name], [name]                             ACCOUNT NO.:  [id number]   MEDICAL RECORD NO.:  [id number]                   PATIENT TYPE:  INP   LOCATION:  5022                                 FACILITY:  [facility]   PHYSICIAN:  Thera Flake., M.D.             DATE OF BIRTH:  02-18-1941   DATE OF PROCEDURE:  DATE OF DISCHARGE:                                 OPERATIVE REPORT   PREOPERATIVE DIAGNOSIS:  Abscess, right foot.   POSTOPERATIVE DIAGNOSIS:  Abscess, right foot.   PROCEDURES:  1. Decompression of abscess, right foot.  2. Bone biopsy and debridement, tarsometatarsal joint.   SURGEON:  Dyke Brackett, M.D.   ASSISTANT:  Jamelle Rushing, P.A.   ANESTHESIA:  General.   TOURNIQUET TIME:  Approximately 25 minutes.   INDICATIONS FOR PROCEDURE:  The patient is a 70 year old female who had  onset of a cellulitis of her foot apparently.  I was consulted on 7/31 right  before lunch time, at which time the patient was in Dr. Sherene Sires office.  We  admitted her semi-emergently after an MRI showed a focal collection of fluid  in the foot.  She was seen on the floor later that same day, placed on IV  antibiotics, and preoperatively evaluated.  It was felt that she would  require formal I&D in the OR.   DESCRIPTION OF PROCEDURE:  Sterile prep and drape.  The tourniquet was  inflated, though the leg was not exsanguinated.  The MRI had suggested some  fluid collection into the flexor __________ sheath.  This was explored.  There was a moderate amount of fluid, although it was difficult to say  whether this was purulent, the patient is on prednisone.  This was cultured.  The tarsometatarsal joint was explored and debrided.  There was some almost  necrotic tissue at the junction of the metatarsal cuneiform joint slightly  proximal to it.  Prior to the I&D, the patient's ankle was aspirated.  Really it was unremarkable.  There was no effusion present.  She did have  cellulitis with no  purulence extending into the medial aspect of the ankle,  but again this was not in the joint but slightly up the leg.  Again, the  fluctuant area was opened, again not productive of a large amount of gross  pus.  It was opened over a distance of about 8-10 cm, copiously irrigated  with antibiotic solution, and bony debridement carried out.  The wound was  left open, packed open, a lightly compressive sterile dressing applied.                                               Thera Flake., M.D.    WDC/MEDQ  D:  10/09/2001  T:  10/15/2001  Job:  [id number]

## 2010-07-31 NOTE — H&P (Signed)
Leslie, [name]                   ACCOUNT NO.:  [id number]   MEDICAL RECORD NO.:  [id number]          PATIENT TYPE:  INP   LOCATION:  0101                         FACILITY:  [facility]   PHYSICIAN:  Sherin Quarry, MD      DATE OF BIRTH:  11/30/1940   DATE OF ADMISSION:  06/23/2005  DATE OF DISCHARGE:                                HISTORY & PHYSICAL   HISTORY OF PRESENT ILLNESS:  [name] is a 70 year old lady who has a  history rheumatoid arthritis and numerous orthopedic problems related to  this underlying disorder.  In January of this year, she sustained a left  trimalleolar fracture and required open reduction and internal fixation of  the ankle fracture.  Since then, according to her family she has been having  increasing problems with falling.  She is able to ambulate short distances  with the use of a walker or gets around to using a motorized wheelchair.  She is currently living by herself.  Family members are not exactly sure how  she is taking her pain medications and state that she has a number of pain  medicines at home.  On Friday, she apparently either slipped or fell out of  bed and sustained rib fractures.  She presented to the emergency room at  that time and was evaluated and discharged home.  This morning, EMS was  called to her home where she was found to be somewhat confused, very weak,  coughing and complaining of chest congestion.  EMS personnel reported that  her blood pressure was 70 systolic and she appeared to cyanotic.  She was  transported to the [facility] Emergency Room where on arrival her blood  pressure was reported to be 104/52 initially then dropping to 85/46.  She  was noted to have an O2 saturation of 72%. which came up to 96% with oxygen.  Chest x-ray was obtained, which showed evidence of a right perihilar  infiltrate consistent with pneumonia.  She is therefore admitted at this  time for management of pneumonia, mental confusion and hypotension  and at  this time the patient appears to be alert and is oriented x3.  She denies  headache.  She reports that she is having discomfort in that side of her  previous rib injuries.  She says that she is not having any abdominal pain  or vomiting.   ALLERGIES:  The patient is reported to be allergic to DEMEROL, PENICILLIN,  and SULFA DRUGS.   CURRENT MEDICATIONS:  Her current medications are not entirely clear.  1.  Apparently she is taking Plaquenil 200 mg b.i.d.  2.  Morphine in the form of MS Contin 100 mg daily.  3.  MSIR 30 mg p.o. q.i.d.  4.  She also takes Loxitane 40 mg daily.  5.  Protonix 40 mg daily.  6.  Neurontin 3 mg at bedtime.  7.  Wellbutrin 3 mg daily.  8.  Prednisone the dose of which is possibly 10 mg twice daily.  9.  She was previously taking Lyrica but her family does  not think she is      currently taking this.   PAST SURGICAL HISTORY:  As previously mentioned, she had open reduction and  internal fixation of a left trimalleolar ankle fracture in January.  She  also had placement of a of hardware in her right knee after a patellar  fracture which had to be removed in July of last year.  She has clearly had  multiple other orthopedic procedures for her arthritis in the past.   PAST MEDICAL HISTORY:  1.  Rheumatoid arthritis on chronic prednisone therapy.  2.  History of myositis.  3.  History of gastroesophageal reflux.  4.  Chronic anxiety and depression.  5.  Chronic narcotic dependence.  6.  History of sleep apnea.  7.  History of fibromyalgia.  8.  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   FAMILY HISTORY:  Noncontributory.   SOCIAL HISTORY:  The patient does not smoke.  She does not drink alcohol or  use drugs.  She is currently living by herself which her son feels as not a  very safe arrangement but she has previously been resistant to have a  placement.   REVIEW OF SYSTEMS:  Review of systems otherwise negative.  See  above.   PHYSICAL EXAMINATION:  HEENT:  Exam is within normal limits.  CHEST:  Exam reveals diffuse wheezes and rhonchi.  CARDIOVASCULAR:  Shows normal S1-S2.  There is a tachycardia.  There are no  rubs or gallops.  ABDOMEN:  The bowel sounds somewhat hypoactive.  There is no guarding or  rebound.  No masses were appreciated.  NEUROLOGIC:  Examination of the extremities is normal.   IMPRESSION:  1.  Apparent pneumonia.  2.  Hypotension with mental confusion, relative adrenal insufficiency may be      playing a role.  3.  Recent fall with rib fracture.  4.  Recent fall with left trimalleolar ankle fracture, status post open      reduction and internal fixation.  5.  Severe rheumatoid arthritis status post multiple surgeries.  6.  History of myositis.  7.  History of stroke.  8.  Anxiety and depression.  9.  Allergy to sulfa drugs and penicillin.   PLAN:  1.  The plan will be to admit the patient to a monitored bed.  2.  She will be given stress doses of steroids.  3.  Will start on Avelox 400 mg IV daily for the presumed pneumonia.  4.  Intravenous fluids will be administered as well as oxygen and breathing      treatments.  5.  Will try to avoid giving her excessive amounts of pain medication.  It      would appear the patient may need a long-term basis of some type of      placement to assist with her care as caring for her at home may not be      very practical at this point.           ______________________________  Sherin Quarry, MD     SY/MEDQ  D:  06/23/2005  T:  06/23/2005  Job:  [id number]   cc:   Gretta Arab. Valentina Lucks, M.D.  Fax: [phone]   Areatha Keas, M.D.  Fax: [phone]

## 2010-07-31 NOTE — Discharge Summary (Signed)
NAME:  [name], [name]                             ACCOUNT NO.:  [id number]   MEDICAL RECORD NO.:  [id number]                   PATIENT TYPE:  INP   LOCATION:  0456                                 FACILITY:  [facility]   PHYSICIAN:  Ollen Gross, M.D.                 DATE OF BIRTH:  Nov 05, 1940   DATE OF ADMISSION:  11/22/2002  DATE OF DISCHARGE:  11/25/2002                                 DISCHARGE SUMMARY   ADMITTING DIAGNOSES:  1. End-stage arthritis, right knee, secondary to rheumatoid arthritis.  2. Rheumatoid arthritis.  3. Fibromyalgia.  4. Gastroesophageal reflux disease.  5. History of Staphylococcus infection, right foot.  6. Urinary incontinence.  7. Hiatal hernia.   DISCHARGE DIAGNOSES:  1. Osteoarthritis, right knee, status post right total knee replacement     arthroplasty.  2. Mild hypokalemia, improved.  3. Rheumatoid arthritis.  4. Fibromyalgia.  5. Gastroesophageal reflux disease.  6. History of Staphylococcus infection, right foot.  7. Urinary incontinence.  8. Hiatal hernia.   PROCEDURE:  The patient was taken to the operating room on November 22, 2002 and underwent a right total knee arthroplasty.  Surgeon - Dr. Lequita Halt;  assistant - Avel Peace, P.A.C.  Anesthesia - spinal.  Minimal blood loss.  Hemovac drain x1.  Tourniquet time 41 minutes at 300 mmHg.   CONSULTS:  Rehabilitation services, Dr. Ellwood Dense.   BRIEF HISTORY:  A 70 year old female well known to Dr. Lequita Halt who had  previously undergone a left total hip arthroplasty earlier this year, did  excellent with rehabilitation, and now presents for knee pain secondary to  advanced rheumatoid arthritis.  She did well with her left, and now comes  back to have surgical intervention on her right.  X-rays show end-stage  arthritis secondary to rheumatoid changes, felt to be an appropriate  candidate.   LABORATORY DATA:  CBC on admission revealed hemoglobin of 10.6, hematocrit  31.8, white cell  count 8.1, red cell count 4.2.  Differential all within  normal limits, with the exception of elevated basophils of 2.  Hemoglobin  and hematocrit postop of 9.9 and 30.1.  Last noted hemoglobin and hematocrit  of 8.9 and 26.6.  PT and PTT on admission 12.7 and 29 respectively.  INR of  0.9.  Serum pro time was followed.  The last noted PT INR was 14 and 1.1.  Chem panel on admission showed low potassium of 3.4, and potassium came back  up to 4.7.  The remaining chem panel all within normal limits, with the  exception of minimally low albumin of 3.  Serial BMET's were followed.  Glucose went up from 88 to 176, back down to 153.  Calcium dropped from 8.6  to 8.3.  Iron studies - low iron studies.  Iron level of November 24, 2002  was 11.  TIBC was low at 238.  The percent saturation  was low at 5%.  Urinalysis negative.  Blood type B negative with a positive antibody screen.   EKG dated September 16, 2002 revealed normal sinus rhythm, normal EKG.  PVC's have  resolved since the tracing of October 09, 2001, confirmed by Dr. Corliss Marcus.  Chest x-ray revealed no active disease, no change, dated September 16, 2002.   HOSPITAL COURSE:  The patient was admitted to [facility], taken to  the OR, and underwent the above-cited procedure without complication.  The  patient tolerated the procedure well.  The patient was taken to the recovery  room, and then to the orthopedic floor to continue postoperative care.  Vital signs were followed.  The patient was given 24 hours of postoperative  IV antibiotics in the form of vancomycin.  Given three weeks of Coumadin.  PT and OT were consulted to assist with gait training, ambulation, and  ADL's.  Rehabilitation consult was called.  She was placed on weightbearing  as tolerated to the right lower extremity.  Hemovac drain placed during  surgery was pulled on postoperative day #1 without difficulty.  The patient  was seen by Rehabilitation Services, and after  further discussion, the  patient was desiring to try to go home.  She actually did very well with  physical therapy, and ambulating 100 feet by postoperative day #2, and  continued to do well with her therapy.  By day #2, the dressing was changed,  the incision was healing well, PCA was discontinued, she was switched over  to p.o. medications.  By day #3, she was doing extremely well with physical  therapy, seen in rounds by Dr. Lequita Halt, and desired to go home.   DISCHARGE PLAN:  The patient was discharged home on November 25, 2002.   DISCHARGE DIAGNOSIS:  Please see above.   DISCHARGE MEDICATIONS:  1. Trinsicon.  2. Coumadin.  3. Percocet.  4. Robaxin.   DIET:  As tolerated.   FOLLOW UP:  Two weeks from surgery.   ACTIVITY:  Weightbearing as tolerated, right lower extremity.  Total knee  protocol.  Home health PT, home health nursing through [facility].   DISPOSITION:  Home.   CONDITION UPON DISCHARGE:  Improved.     Alexzandrew L. Julien Girt, P.A.              Ollen Gross, M.D.    ALP/MEDQ  D:  12/24/2002  T:  12/24/2002  Job:  [id number]   cc:   Gretta Arab. Valentina Lucks, M.D.  [address]  [place] [address]  Fax: [phone]   Areatha Keas, M.D.  [address]  [address]  [place]  [place] [address]  Fax: [phone]   Lacretia Leigh. Ninetta Lights, M.D.  [address]  [place]  [place] [address]  Fax: [phone]

## 2010-07-31 NOTE — Op Note (Signed)
[name], [name]                   ACCOUNT NO.:  [id number]   MEDICAL RECORD NO.:  [id number]          PATIENT TYPE:  AMB   LOCATION:  DAY                          FACILITY:  [facility]   PHYSICIAN:  Ollen Gross, M.D.    DATE OF BIRTH:  1940/11/28   DATE OF PROCEDURE:  10/03/2004  DATE OF DISCHARGE:                                 OPERATIVE REPORT   PREOPERATIVE DIAGNOSIS:  Painful hardware, right knee.   POSTOPERATIVE DIAGNOSIS:  Painful hardware, right knee.   PROCEDURE:  Hardware removal, right knee.   SURGEON:  Ollen Gross, M.D.   ANESTHESIA:  Local with MAC.   ESTIMATED BLOOD LOSS:  Minimal.   COMPLICATIONS:  None.   CONDITION:  Stable to the recovery room.   CLINICAL NOTE:  [name] is a 70 year old female with a periprosthetic patella  fracture several months ago.  We tried open reduction and internal fixation  x2 and her bone has not been able to hold the hardware. She had partial  hardware removal with a couple months ago but now has pain from one retained  wire.  She presents now for removal of that wire.   PROCEDURE IN DETAIL:  After the successful initiation of MAC anesthetic via  mask, the right lower extremity was prepped and draped in the usual sterile  fashion.  I infiltrated the subcutaneous tissue right around the palpable  wire with total of 20 cc 1% lidocaine with epinephrine.  I used a previous  midline incision and incising about a centimeter over the area where the  wire is. Skin was cut with 10 blade through subcutaneous tissue and a knot  from the wires identified.  I used a wire cut the wire and then was able to  remove the entire piece.  There is no other retained hardware present.  The  wound was then copiously irrigated with saline solution and the deep tissue  closed interrupted #1 Vicryl, subcu interrupted 2-0 Vicryl and skin  interrupted 4-0 nylon.  I then infiltrated the subcu tissues with a 10 cc of  4% Marcaine with epinephrine. A bulky sterile  dressing was then applied and  she is awakened and transferred to recovery in stable condition.       FA/MEDQ  D:  10/03/2004  T:  10/04/2004  Job:  [id number]

## 2010-07-31 NOTE — Discharge Summary (Signed)
NAME:  [name], [name]                             ACCOUNT NO.:  [id number]   MEDICAL RECORD NO.:  [id number]                   PATIENT TYPE:  IPS   LOCATION:  4144                                 FACILITY:  [facility]   PHYSICIAN:  Ranelle Oyster, M.D.             DATE OF BIRTH:  [birth year]   DATE OF ADMISSION:  09/23/2002  DATE OF DISCHARGE:  09/28/2002                                 DISCHARGE SUMMARY   DISCHARGE DIAGNOSES:  1. 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  2. Pain management.  3. Coumadin for deep venous thrombosis prophylaxis.  4. Fibromyalgia.  5. Rheumatoid arthritis.  6. Gastroesophageal reflux disease.  7. History of Staph infection to the right foot.   HISTORY OF PRESENT ILLNESS:  A 70 year old white female with history of  rheumatoid arthritis, fibromyalgia, and Staph infection of the right foot  who was admitted to [facility] September 20, 2002, with advanced  bilateral knee pain, left greater than right, no change with conservative  care.  She underwent a left total knee arthroplasty September 20, 2002, per Dr.  Lequita Halt.  She was placed on Coumadin for deep venous thrombosis prophylaxis  and weightbearing as tolerated.  Postoperative anemia of 9.  No chest pain.  No shortness of breath.  She remained on prednisone and Plaquenil for  rheumatoid arthritis.  She was maintained on Avalox September 20, 2002, as prior  to admission of which the patient was on Levaquin for history of Staph  infection, osteomyelitis followed by infectious disease.  She was moderate  assist with transfers.  Latest INR 1.7, hemoglobin 9.7.  Chest x-ray  negative.  She was admitted for a comprehensive rehab program.   ALLERGIES:  SULFA, PENICILLIN and DEMEROL.   MEDICATIONS PRIOR TO ADMISSION:  1. MS-Contin 130 mg q.8h.  2. Morphine sulfate immediate release 30 mg q.i.d. p.r.n.  3. Plaquenil.  4. Prednisone.  5. Arava.  6. Fosamax.  7.  Wellbutrin.  8. Prozac.  9. Ambien.  10.      Detrol.  11.      Reglan.  12.      Levaquin.   PRIMARY CARE PHYSICIAN:  Areatha Keas, M.D., for her rheumatology.   PAST MEDICAL HISTORY:  See discharge diagnoses.   PAST SURGICAL HISTORY:  1. Hysterectomy.  2. Hemorrhoidectomy.   SOCIAL HISTORY:  The patient is divorced, two children.  She has a ramp to  her mobile home.  She used a wheelchair primarily prior to admission.  Family works day shift.   HOSPITAL COURSE:  The patient did well while in rehabilitation services with  therapies initiated on a b.i.d. basis.  The following issues were followed  during the patient's rehab course.  Pertaining to Ms. Kirtz's left total knee  arthroplasty, surgical site was healing nicely with no signs of infection.  She was ambulating with the walker, weightbearing as tolerated.  Home health  therapies had been arranged.  She continued on Coumadin for deep venous  thrombosis prophylaxis.  Latest INR was 2.6.  She would complete Coumadin  protocol followed by [facility].   Pain management ongoing with a history of chronic pain.  Her MS-Contin was  decreased to 100 mg q.8h.  She remained on her morphine sulfate immediate  release 30 mg four times daily as needed.  She was followed by rheumatology  services, Dr. Phylliss Bob for her rheumatoid arthritis and maintained on prednisone  and Plaquenil.   She had a history of a Staph infection of the right foot on Levaquin prior  to hospital admission.  This was maintained indefinitely per infectious  disease.   Overall, for her functional ability, she was ambulating extended distances  with a walker.  CPM machine had been advanced, needing very minimal  assistance for lower body dressing.  Knee range of motion had been advanced  to 90 degrees with her therapies.  She would be discharged to home during  her rehab stay.   Anemia monitored at 9.9 and 8.8 with follow-up chemistries  pending on the  day of discharge.  Sodium 141, potassium 3.5, BUN 9, creatinine 0.8.   DISCHARGE MEDICATIONS:  1. Coumadin with latest dose of 3 mg to continue for October 21, 2002.  2. Trinsicon twice daily.  3. Wellbutrin 150 mg twice daily.  4. Prozac 20 mg daily.  5. Plaquenil 200 mg twice daily.  6. Levaquin daily.  7. Protonix 40 mg daily.  8. Prednisone 10 mg daily.  9. MS-Contin 100 mg every eight hours.  10.      Morphine sulfate immediate release 30 mg four times daily as     needed.   ACTIVITY:  As tolerated.   DIET:  Regular.   WOUND CARE:  Cleanse incision daily with warm soap and water.   DISCHARGE INSTRUCTIONS:  No aspirin or ibuprofen while on Coumadin.  Home  health nurse to manage prothrombin times with results to be completed per  rehab services.  Home health physical and occupational therapy.     Mariam Dollar, P.A.                     Ranelle Oyster, M.D.    DA/MEDQ  D:  09/27/2002  T:  09/28/2002  Job:  [id number]   cc:   Ollen Gross, M.D.  [address]  [place]  [place] [address]  Fax: [phone]   Areatha Keas, M.D.  [address]  [address]  [place]  [place] [address]  Fax: [phone]    cc:   Ollen Gross, M.D.  [address]  [place]  [place] [address]  Fax: [phone]   Areatha Keas, M.D.  [address]  [address]  [place]  [place] [address]  Fax: [phone]

## 2010-07-31 NOTE — Op Note (Signed)
[name], [name]                   ACCOUNT NO.:  [id number]   MEDICAL RECORD NO.:  [id number]          PATIENT TYPE:  INP   LOCATION:  1006                         FACILITY:  [facility]   PHYSICIAN:  Ollen Gross, M.D.    DATE OF BIRTH:  30-Nov-1940   DATE OF PROCEDURE:  04/03/2005  DATE OF DISCHARGE:                                 OPERATIVE REPORT   PREOPERATIVE DIAGNOSIS:  Left trimalleolar ankle fracture.   POSTOPERATIVE DIAGNOSIS:  Left trimalleolar ankle fracture.   PROCEDURE:  Open reduction internal fixation of left trimalleolar ankle  fracture.   SURGEON:  Ollen Gross, M.D.   ASSISTANT:  Alexzandrew L. Julien Girt, P.A.   ANESTHESIA:  Spinal.   ESTIMATED BLOOD LOSS:  Minimal.   DRAINS:  None.   COMPLICATIONS:  None.   TOURNIQUET TIME:  33 minutes at 300 mmHg.   CONDITION.:  Stable to recovery.   CLINICAL NOTE:  [name] is a 70 year old female well-known to me with severe  rheumatoid arthritis. She fell at home yesterday, suffering a trimalleolar  ankle fracture.  She was taken to the operating room today for open  reduction internal fixation.   PROCEDURE IN DETAIL:  After the successful initiation of spinal anesthetic,  a tourniquet was placed high on the left thigh and the left lower extremity  prepped and draped in the usual sterile fashion.  The extremity was  elevated, tourniquet inflated to 300 mmHg.  A lateral incision made over the  fibula centered over the fracture.  Skin was cut with a #10 blade through  subcutaneous tissue.  The fraction was identified and was anatomically  reduced.  A 7-hole, one-third tubular plate was placed and we held the  fracture reduced. It was comminuted, thus not amenable to a lag screw.  I  placed two cancellous and one cortical screw distal to the fracture, three  cortical screws proximal to the fracture. This left a very stable anatomic  fixation.  C-arm confirmed anatomic reduction.  I then made an incision  medially over the  medial malleolus.  This was a very large fragment.  I  reduced it anatomically. We placed two, 50 mm cancellous screws from distal  to proximal and had very stable reduction.  The mortise was restored.  She  had essentially anatomic reduction.  AP, lateral and mortis views taken and  saved.  We then irrigated the incisions with  saline, closing subcutaneous tissue with 2-0 Vicryl, and skin with staples.  Bulky sterile dressing and posterior splint were applied. Tourniquet was  released at a total time of 33 minutes.  She was awakened and transported to  recovery in stable condition.      Ollen Gross, M.D.  Electronically Signed     FA/MEDQ  D:  04/03/2005  T:  04/04/2005  Job:  [id number]

## 2010-07-31 NOTE — H&P (Signed)
   [name], [name]                               ACCOUNT NO.:  [id number]   MEDICAL RECORD NO.:  [id number]                    PATIENT TYPE:   LOCATION:                                       FACILITY:   PHYSICIAN:  Ollen Gross, M.D.                 DATE OF BIRTH:   DATE OF ADMISSION:  09/20/2002  DATE OF DISCHARGE:                                HISTORY & PHYSICAL   CONTINUATION:   REVIEW OF SYSTEMS:  GU: Some urgency. No dysuria, hematuria or discharge.   PHYSICAL EXAMINATION:  VITAL SIGNS: Pulse 84, respirations 12, blood  pressure 116/68.  GENERAL: The patient is a 70 year old female, well nourished, well  developed, alert, oriented and cooperative. Very pleasant at the time of  examination. She is mildly anxious at the time of visit.  HEENT: Normocephalic, atraumatic. Pupils are round, and reactive. Oropharynx  clear.  NECK: Supple. No carotid bruits are appreciated.  CHEST: Clear to auscultation anterior and posterior chest wall. Chest walls,  no rhonchi or rales.  HEART: Regular rate and rhythm. No murmurs. S1 and S2 noted.  ABDOMEN: Soft, slightly round abdomen. Nontender. Bowel sounds present. No  rebound tenderness.  RECTAL/BREAST/GENITALIA: Not done. Not pertinent to present illness.  EXTREMITIES: She has multiple rheumatoid deformities of both hands and feet.  With respect to the left knee, she has marked crepitus noted on passive  range of motion. There is no instability appreciated. Range of motion  of  the left knee is about 5-120 degrees. She has approximately 20 degree valgus  deformity of the left knee. The right knee is also noted to have marked  crepitus, 20 degree valgus deformity and range of motion of 5-120 degrees.   IMPRESSION:  1. Rheumatoid arthritis bilateral knees.  2. Hiatal hernia.  3. Reflux disease.  4. Urinary incontinence.  5. Past history of staph infection.    PLAN:  The patient admitted to [facility] to undergo a left  total  knee replacement arthroplasty. Surgery will be performed by Dr. Ollen Gross. Dr. Ninetta Lights of infectious disease will be consulted, to assist with  management of this patient due to recent staph infections.     Alexzandrew L. Julien Girt, P.A.              Ollen Gross, M.D.    ALP/MEDQ  D:  09/19/2002  T:  09/19/2002  Job:  [id number]   cc:   Lacretia Leigh. Ninetta Lights, M.D.  [address]  [place]  [place] [address]  Fax: [phone]   Areatha Keas, M.D.  [address]  [address]  [place]  [place] [address]  Fax: [phone]

## 2010-07-31 NOTE — Discharge Summary (Signed)
NAME:  [name], [name]                             ACCOUNT NO.:  [id number]   MEDICAL RECORD NO.:  [id number]                   PATIENT TYPE:  INP   LOCATION:  0454                                 FACILITY:  [facility]   PHYSICIAN:  Ollen Gross, M.D.                 DATE OF BIRTH:  1940-05-21   DATE OF ADMISSION:  09/20/2002  DATE OF DISCHARGE:  09/23/2002                                 DISCHARGE SUMMARY   ADMISSION DIAGNOSES:  1. Rheumatoid arthritis bilateral knees.  2. Hiatal hernia.  3. Reflux disease.  4. Urinary incontinence.  5. Past history of staph infection.   DISCHARGE DIAGNOSES:  1. Rheumatoid arthritis with severe valgus deformity of the left knee status     post left total knee arthroplasty.  2. Rheumatoid arthritis right knee.  3. Hiatal hernia.  4. Reflux disease.  5. Urinary incontinence.  6. Past history of staph infection.   CONSULTATIONS:  1. Rehab services, Ranelle Oyster, M.D.  2. Infectious disease, Lacretia Leigh. Ninetta Lights, M.D.   PROCEDURE:  The patient was taken to the OR on September 20, 2002 and underwent a  left total knee arthroplasty.  Surgeon:  Ollen Gross, M.D.  Assistant:  Alexzandrew L. Perkins, P.A.-C.  Spinal anesthesia, minimal blood loss,  Hemovac drain x1.  Tourniquet time:  41 minutes at 300 mmHg.   BRIEF HISTORY:  A 70 year old female who has been seen by Ollen Gross,  M.D. for bilateral knee pain, known history of rheumatoid arthritis.  She  has been seen by Dr. Dyke Brackett, in the past and also at [facility] and was recommended that she follow up with Dr. Lequita Halt by a friend  and fellow patient of Dr. Lequita Halt.  She came in with significant pain and  dysfunction.  There was some concern of having a previous infection in the  past.  She has had intermittent foot infections on the right side and  recently had a flare and was on antibiotics for that.  She has seen Tinnie Gens  C. Ninetta Lights, M.D. in the past.  In the office, she was  found to have  rheumatoid arthritis and deformities of both knees.  It is felt due to the  significant deformities that she would benefit from undergoing total knees.  Risks and benefits discussed.  The patient subsequently admitted to the  hospital.   LABORATORY DATA:  CBC on admission:  Hemoglobin 10.1, hematocrit of 30.5,  white cell count 10.4, red cell count 4.02, differential showed elevated  neutrophils of 85, decrease in lymphs 7, monos 6, eos 2, basos 0.  Postop  H&H 9.3 and 28.1.  Last noted H&H 9.7 and 29.6.  PT/PTT on admission 12.9  and 32, INR 1.  Serial pro times followed.  Last noted PT/INR prior to  discharge 17.1 and 1.7.  Chem panel on admission all within normal  limits  with exception of decreased albumin 2.9.  Serial BMETs were followed  dropping calcium from 10 to 8.3, glucose went up from 101 to 138.  Urinalysis on admission negative.  Blood group type:  B negative with a  positive antibody screen.   EKG dated September 16, 2002:  Normal sinus rhythm, normal EKG.  Since October 09, 2001 PVCs have resolved confirmed with Francisca December, M.D.  Chest x-ray  dated September 16, 2002:  No change and no active disease.   HOSPITAL COURSE:  The patient was admitted to [facility] taken to  the OR and underwent the above-stated procedure without complication.  The  patient tolerated the procedure well, later sent to recovery, and then to  the orthopedic floor for continued postoperative care.  Vital signs were  followed.  The patient was given 24 hours of postop IV antibiotics, was  placed on Coumadin for three weeks for DVT prophylaxis, given p.o. and PC  analgesics for pain control.  She was also placed on a prednisone tapering  dose schedule due to chronic prednisone.  Hemovac drain placed at the time  of surgery was pulled on postop day #1.  Fluids were KVO'd.   PT and OT were consulted to assist with gait training, ambulation, and ADLs.  The patient underwent a rehab  consult by Dr. Riley Kill and felt she would be an  appropriate inpatient candidate, slow to progress with physical therapy  during the hospital course.  She was up ambulating 5 feet by postop day #1  and had increased up to 50 feet by postop day #2.  Dressing change initiated  on postop day #2, incision was healing well.  PCA and IV were discontinued  at that time.   The patient was seen by Lacretia Leigh. Ninetta Lights, M.D. on postop day #3.  Known  history of past methicillin-sensitive Staph aureus infections with a history  of myofasciitis and cellulitis.  Recommended current treatment and follow  the total knee.  On day #3 the incision was healing well.  It was noted bed  became available on the rehab unit. The patient was doing well and was  discharged over to rehab.   DISCHARGE PLAN:  1. The patient was transferred to [facility] on September 23, 2002.  2. Discharge diagnoses please see above.  3. Discharge meds:     a. Percocet for pain of her back for her spasms.     b. Coumadin for DVT prophylaxis for three weeks.     c. Continue home meds.  4. Diet:  As tolerated.  5. Activity:  Full weight bearing, weightbearing as tolerated to the left     lower extremity.  6. Continue gait training, ambulation, and ADLs as per PT and OT on rehab.     Total knee protocol.  7. Follow up two weeks from date of surgery and following discharge from     rehab unit.    DISPOSITION:  [facility].   CONDITION ON DISCHARGE:  Improving.     Alexzandrew L. Julien Girt, P.A.              Ollen Gross, M.D.    ALP/MEDQ  D:  10/20/2002  T:  10/20/2002  Job:  [id number]   cc:   Rehab Services   Ranelle Oyster, M.D.  [address] [place]  [place] [address]  Fax: [phone]   Lacretia Leigh. Ninetta Lights, M.D.  [phone]  [address]  [place]  [place] [address]  Fax: [phone]

## 2010-07-31 NOTE — H&P (Signed)
NAME:  [name], [name]                             ACCOUNT NO.:  [id number]   MEDICAL RECORD NO.:  [id number]                   PATIENT TYPE:  INP   LOCATION:  NA                                   FACILITY:  [facility]   PHYSICIAN:  Ollen Gross, M.D.                 DATE OF BIRTH:  [birth year]   DATE OF ADMISSION:  09/20/2002  DATE OF DISCHARGE:                                HISTORY & PHYSICAL   CHIEF COMPLAINT:  Bilateral knee pain.   HISTORY OF PRESENT ILLNESS:  The patient is a 70 year old female who has  been seen by Dr. Ollen Gross for bilateral knee pain.  Her knees have been  giving her problems for several years now.  She has known history of  rheumatoid arthritis.  She has been seen by Dr. Kristeen Miss in the past and  also at [facility].  She was recommended to follow up with Dr.  Lequita Halt by a friend and fellow patient of Dr. Lequita Halt.  She came in and was  found to have significant pain in the knees.  They hurt and had become more  problematic with her mobility.  She is a stage now where she would like to  have something done.  She has increased pain and dysfunction.  States that  she has difficulty walking.  She was very concerned in the past because of a  previous infection.  She has had intermittent foot infections on the right  side and recently had a flare and was on antibiotics for that.  She has been  seen by Dr. Ninetta Lights in the past.  She was seen in the office and found to  have rheumatoid arthritis, deformities of both knees.  The risks and  benefits of the procedures have been discussed with the patient.  She has  elected to proceed with surgery.  We will proceed with the left knee first  due to the recent reaction in the right foot.   ALLERGIES:  PENICILLIN causes hives.   INTOLERANCES:  SULFA causes GI upset.  DEMEROL makes her crazy and  delusional.   CURRENT MEDICATIONS:  MS Contin, MSIR, Humira, Arava, Plaquenil, prednisone,  Fosamax, Wellbutrin,  Prozac, Ambien, Detrol, Reglan, Lomotil, __________  Larose Hires, and Levaquin.   PAST MEDICAL HISTORY:  1. Rheumatoid arthritis.  2. Hiatal hernia.  3. Reflux disease.  4. Urinary incontinence.  5. History of staphylococcus infections in the right foot requiring surgery.   PAST SURGICAL HISTORY:  1. Hysterectomy.  2. Hemorrhoidectomy.  3. Recent I&D of the right foot.   FAMILY HISTORY:  Mother living, age 63, with hypertension.  Father living,  age 35, with arthritis and blood problems.   SOCIAL HISTORY:  Divorced.  Has two children.  Denies use of tobacco  products or alcohol products.  She has a ramp leading into her mobile home.   REVIEW  OF SYSTEMS:  GENERAL:  She has occasional night sweats.  No fevers or  chills. NEUROLOGIC:  No seizures, syncope, or paralysis.  RESPIRATORY:  She  does have some shortness of breath on exertion.  No shortness of breath at  rest.  No productive cough or hemoptysis.  GASTROINTESTINAL:  She does have  some chronic diarrhea.  She has had some intermittent nausea recently which  she attributes to the Levaquin.  No vomiting.  No blood or mucous in the  stool.  CARDIOVASCULAR:  No chest pain, angina, or orthopnea.  GENITOURINARY:  She does have some   DICTATION ENDED AT THIS POINT....     Alexzandrew L. Julien Girt, P.A.              Ollen Gross, M.D.    ALP/MEDQ  D:  09/19/2002  T:  09/19/2002  Job:  [id number]   cc:   Areatha Keas, M.D.  [address]  [address]  [place]  [place] [address]  Fax: [phone]   Lacretia Leigh. Ninetta Lights, M.D.  [address]  [place]  [place] [address]  Fax: [phone]

## 2010-07-31 NOTE — H&P (Signed)
[name], [name]                   ACCOUNT NO.:  [id number]   MEDICAL RECORD NO.:  [id number]          PATIENT TYPE:  INP   LOCATION:  0456                         FACILITY:  [facility]   PHYSICIAN:  Ollen Gross, M.D.    DATE OF BIRTH:  07-14-1940   DATE OF ADMISSION:  04/04/2004  DATE OF DISCHARGE:                                HISTORY & PHYSICAL   CHIEF COMPLAINT:  Right knee pain.   HISTORY OF PRESENT ILLNESS:  The patient is a 70 year old female with severe  rheumatoid arthritis who sustained a periprosthetic patellar fracture a few  months ago.  She initially had it openly reduced and fixed internally a few  weeks ago, but she went to trip and had failure with the fixation.  She now  presents for repeat fixation with bone grafting.   ALLERGIES:  PENICILLIN CAUSES HIVES, DEMEROL CAUSES HALLUCINATIONS, SULFA  CAUSES GI UPSET.   CURRENT MEDICATIONS:  1.  Protonix.  2.  MS Contin.  3.  MSIR.  4.  Plaquenil.  5.  Prednisone.  6.  Fluoxetine.  7.  Antivert.  8.  Valium.  9.  Reglan.  10. Phenergan.  11. Lomotil.   PAST MEDICAL HISTORY:  1.  Iron deficiency anemia.  2.  Hiatal hernia.  3.  Reflux disease.  4.  Chronic diarrhea.  5.  Rheumatoid arthritis.  6.  Myalgia/myositis.  7.  Anxiety and depression.  8.  Narcotic dependence.   PAST SURGICAL HISTORY:  1.  Bilateral total knee replacement arthroplasties.  2.  Bilateral foot surgery.  3.  Hysterectomy.  4.  Bilateral cataracts with lens implants.  5.  Hemorrhoid surgery.  6.  Mammoplasty with subsequent removal of implants.   FAMILY HISTORY:  Noncontributory.   SOCIAL HISTORY:  Nonsmoker, two pack per day for about 30 years, quit 10  years ago.   REVIEW OF SYSTEMS:  GENERAL:  No fevers, chills, night sweats.  NEUROLOGIC:  No paralysis.  RESPIRATORY:  No shortness of breath, productive cough, or  hemoptysis.  CARDIOVASCULAR:  No chest pain or arrythymia.  GI:  No nausea,  vomiting, diarrhea, constipation.   GU:  No dysuria, hematuria, or discharge.  MUSCULOSKELETAL:  As in history of present illness.   PHYSICAL EXAMINATION:  VITAL SIGNS:  Temperature 98.1, pulse 88,  respirations 20, blood pressure 125/56.  GENERAL:  A 70 year old white female, well-nourished, well-developed, in no  acute distress.  Alert, oriented, and cooperative.  HEENT:  Normocephalic, atraumatic.  Pupils round and reactive.  Oropharynx  clear.  EOMs intact.  NECK:  Supple.  CHEST:  Clear anterior and posterior chest walls.  HEART:  Regular rate and rhythm.  No murmurs.  ABDOMEN:  Soft, nontender, bowel sounds present.  RECTAL, BREASTS, GENITALIA:  Not done, not pertinent to present illness.  EXTREMITIES:  Right knee anterior incision, well-healed.  She does have a  mild-to-moderate effusion.  Tender over the anterior patellar region.  She  is seen in a knee immobilizer.   IMPRESSION:  Right periprosthetic patellar fracture with hardware failure.   PLAN:  The  patient is admitted to [facility] to undergo repeat  ORIF and bone grafting of the right patella.      ALP/MEDQ  D:  05/03/2004  T:  05/03/2004  Job:  [id number]

## 2010-07-31 NOTE — Op Note (Signed)
NAME:  [name], [name]                             ACCOUNT NO.:  [id number]   MEDICAL RECORD NO.:  [id number]                   PATIENT TYPE:  INP   LOCATION:  H846                                 FACILITY:  [facility]   PHYSICIAN:  Ollen Gross, M.D.                 DATE OF BIRTH:  Aug 31, 1940   DATE OF PROCEDURE:  09/20/2002  DATE OF DISCHARGE:                                 OPERATIVE REPORT   PREOPERATIVE DIAGNOSIS:  Rheumatoid arthritis with severe valgus deformity,  left knee.   POSTOPERATIVE DIAGNOSIS:  Rheumatoid arthritis with severe valgus deformity,  left knee.   PROCEDURE:  Left total knee arthroplasty.   SURGEON:  Gus Rankin. Aluisio, M.D.   ASSISTANT:  Alexzandrew L. Julien Girt, P.A.   ANESTHESIA:  Spinal.   ESTIMATED BLOOD LOSS:  Minimal.   DRAIN:  Hemovac x 1.   TOURNIQUET TIME:  41 minutes at 300 mmHg.   COMPLICATIONS:  None.   CONDITION:  Stable to recovery.   BRIEF CLINICAL NOTE:  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  She has severe valgus deformity in both sides.  She presents now for left total knee arthroplasty.   PROCEDURE IN DETAIL:  After the successful administration of spinal  anesthetic, the tourniquet is placed high on the left thigh and left lower  extremity prepped and draped in the usual sterile fashion.  Extremity is  wrapped in Esmarch, knee flexed, tourniquet inflated to 300 mmHg.  Standard  midline incision is made with a 10 blade through subcutaneous tissue to the  level of the extensor mechanism.  Given her severe valgus deformity, I made  a lateral parapatellar arthrotomy.  Then the soft tissue over the proximal  and lateral tibia is subperiosteally elevated to the joint line with a knife  and then around the lateral joint line with the Cobb elevator.  Soft tissue  over the proximal and medial tibia is left intact.  The patella is  then  everted medially without difficulty and the knee flexed 90 degrees.  I  removed some intercondylar osteophytes and took out her PCL.  Her ACL was  already gone.  She had bone-on-bone throughout the entire knee.  A drill was  used to create a starting hole in the distal femur; canal is irrigated and a  five degree left valgus alignment guide placed.  Referencing off the  posterior condyles, rotation is marked and then the block pinned to remove  10 mm off the distal femur.  Distal femoral resection is made with an  oscillating saw.  A sizing block is placed, and size 3 is the most  appropriate.  The AP block is placed and if we used her posterior femur as a  reference, it would have internally rotated; thus we had to  use the  epicondylar axis as a reference, and we pinned the block for a size 3 and  did the anterior and posterior cuts.  The tibia is then subluxed forward and  meniscal remnants removed.  The extramedullary tibial alignment guide is  placed, referencing proximally at the medial aspect of the tibial tubercle  and distally along the second metatarsal axis and tibial crest.  The block  is pinned to remove 2 mm off the deficient lateral side.  Tibial resection  is made with an oscillating saw.  The size 3 tibial platform is placed and  the proximal tibia prepared with the modular drill and keel punch for a  rotating platform tibia.  Femoral preparation is then completed with the  intercondylar and chamfer cuts.   Size 3 posterior stabilized femoral trial, size 3 mobile bearing tibial tray  trial, and then a 12.5 insert is placed.  With the 12.5, there is still some  laxity.  Thus, a 15 mm posterior stabilized rotating platform insert is  placed, knee held in full extension, and her valgus deformity is corrected.  She had excellent varus and valgus balance throughout her full range of  motion.  The patella is everted and again thickness measured to be at 21 mm.  Free hand  resection is made, 35 template placed, lug holes drilled, trial  patella placed, and it tracks normally.  Osteophytes are then removed off  the posterior femur with the trial in place.  All trials are then removed  and the cut bone surfaces prepared with pulsatile lavage.  Cement is mixed  and once ready for implantation, the size 3 mobile bearing tibial tray, size  3 posterior stabilized femur, and 35 patella are cemented into place, and  patella is held with a clamp.  Knee is held with full extension with the  trial insert in place.  All extruded cement is removed.  Once the cement is  fully hardened, then the permanent 15 mm posterior stabilized rotating  platform insert is placed.  She had excellent stability throughout full  range of motion.  The tourniquet is then released for a total time of 41  minutes and minor bleeding stopped with cautery.  The arthrotomy laterally  is left open from the superior and inferior aspect of the patella.  The  remainder is closed with interrupted #1 PDS.  Once closure is performed,  then she had flexion against gravity of 140 degrees with excellent central  tracking of the patella.  Subcutaneous tissue is then closed with  interrupted 2-0 Vicryl and subcuticular running 4-0 Monocryl.  The incision  is clean and dry and Steri-Strips and a bulky sterile dressing applied.  She  is then placed into a knee immobilizer, awakened, and transported to  recovery in stable condition.                                               Ollen Gross, M.D.    FA/MEDQ  D:  09/20/2002  T:  09/20/2002  Job:  [id number]

## 2010-07-31 NOTE — Discharge Summary (Signed)
NAMESHEREEN, MARTON                   ACCOUNT NO.:  [id number]   MEDICAL RECORD NO.:  [id number]          PATIENT TYPE:  INP   LOCATION:  0456                         FACILITY:  [facility]   PHYSICIAN:  Ollen Gross, M.D.    DATE OF BIRTH:  14-Aug-1940   DATE OF ADMISSION:  04/04/2004  DATE OF DISCHARGE:  04/07/2004                                 DISCHARGE SUMMARY   ADMITTING DIAGNOSES:  1.  Failed fixation of right periprosthetic patella fracture.  2.  Chronic iron deficiency anemia.  3.  Hiatal hernia.  4.  Reflux disease.  5.  Chronic constipation.  6.  Rheumatoid arthritis.  7.  Myalgia myositis.  8.  Chronic narcotic dependence.  9.  Anxiety/depression.  10. Status post open reduction internal fixation right patella secondary to      fracture.   DISCHARGE DIAGNOSES:  1.  Right periprosthetic patella fracture with failure fixation status post      open reduction internal fixation right periprosthetic patella fracture      with bone grafting.  2.  Chronic iron deficiency anemia.  3.  Hiatal hernia.  4.  Reflux disease.  5.  Chronic constipation.  6.  Rheumatoid arthritis.  7.  Myalgia myositis.  8.  Chronic narcotic dependence.  9.  Anxiety/depression.  10. Status post open reduction internal fixation right patella secondary to      fracture.   PROCEDURE:  Surgery April 04, 2004:  Repeat ORIF right periprosthetic  patella fracture with bone grafting. Surgeon:  Dr. Lequita Halt. Assistant:  Avel Peace, P.A.-C. Spinal anesthesia. Minimal blood loss. Tourniquet time 51  minutes at 300 mmHg.   BRIEF HISTORY:  The patient is a 70 year old female with severe rheumatoid  arthritis who sustained a right periprosthetic patella fracture a few months  ago. She initially had it openly reduced and fixed internally a few weeks  ago. Unfortunately, she tripped and had failure of the fixation, now  presents for repeat fixation with bone grafting.   CONSULTS:  None.   LABORATORY DATA:   H&H on admission 12.1 and 36.6. Blood group/type B  negative.   HOSPITAL COURSE:  The patient was admitted to [facility] for above  procedure, tolerated the procedure well, later sent to the recovery room and  then to the orthopedic floor. The patient was placed on PCA and p.o.  analgesics for pain control. Seen on rounds on day #1, must have the knee  immobilizer on at all times. Was doing fairly well, did have some pain but  was tolerable. Left the Foley catheter in at that time. She could be  weightbearing as tolerated. Started to get up out of bed for mobility. By  day #2 she had started to get up with physical therapy and started to  progress with her mobility. It was felt she would probably more than likely  be ready to go home the following day, no complaints. On April 07, 2004  she had been seen in rounds, was comfortable, no acute distress, progressed  with therapy, and was discharged home.  DISCHARGE PLAN:  1.  The patient was discharged home on April 07, 2004.  2.  Discharge diagnoses:  Please see above.  3.  Discharge medications:  Percocet for pain.  4.  Activity:  She is weightbearing as tolerated to the right lower      extremity. Knee immobilizer AT ALL TIMES. No motion to the knee.  5.  Follow up in about 2 weeks, call the office for an appointment.   DISPOSITION:  Home.   CONDITION UPON DISCHARGE:  Improved.   DIET:  Resume previous home diet.      ALP/MEDQ  D:  05/03/2004  T:  05/03/2004  Job:  [id number]   cc:   Gretta Arab. Valentina Lucks, M.D.  [address] [place]  [place] [address]  Fax: [phone]

## 2010-07-31 NOTE — Op Note (Signed)
NAME:  [name], [name]                             ACCOUNT NO.:  [id number]   MEDICAL RECORD NO.:  [id number]                   PATIENT TYPE:  INP   LOCATION:  0456                                 FACILITY:  [facility]   PHYSICIAN:  Ollen Gross, M.D.                 DATE OF BIRTH:  02/28/1941   DATE OF PROCEDURE:  DATE OF DISCHARGE:                                 OPERATIVE REPORT   PREOPERATIVE DIAGNOSIS:  Osteoarthritis of the right knee.   POSTOPERATIVE DIAGNOSES:  Osteoarthritis of the right knee.   OPERATION/PROCEDURE:  Right total knee arthroplasty.   SURGEON:  Ollen Gross, M.D.   ASSISTANT:  Alexzandrew L. Perkins, P.A.-C.   ANESTHESIA:  Spinal.   ESTIMATED BLOOD LOSS:  Minimal.   DRAINS:  Hemovac x1.   COMPLICATIONS:  None.   TOURNIQUET TIME:  41 minutes at 300 mmHg.   DISPOSITION:  Stable to recovery.   BRIEF CLINICAL NOTE:  Mrs. [name] is a 70 year old female with end-stage  arthritis of the right knee with severe valgus deformity.  She has  rheumatoid arthritis.  She has had a previous very successful left total  knee and presents now for right total knee arthroplasty.   DESCRIPTION OF PROCEDURE:  After the successful administration of spinal  anesthetic, tourniquet was placed on the right thigh and right lower  extremity prepped and draped in the usual sterile fashion. The extremity was  wrapped in Esmarch, knee flexed, tourniquet inflated to 300 mmHg.  Midline  incision was made with a 10-blade through the subcutaneous tissue to the  level of the extensor mechanism.  A fresh blade was used to make a lateral  parapatellar arthrotomy.  We used the lateral arthrotomy because of the  valgus deformity.  I then subperiosteally elevated the soft tissue with the  proximal and lateral tibia around the joint line.  We everted the patella  medially and flexed the knee 90 degrees removing the ACL and PCL.  Drill was  used to create a starting hole at the distal femur and  the canal was  irrigated.  A five-degree right valgus alignment guide was placed, and  referencing at the posterior condyles, rotation was marked and the block  pins were moved about 10 mm off the distal femur.  Distal femoral resection  was made with an oscillating saw.  Sizing block was placed and size 3 was  the most appropriate.  The rotation corresponds with the epicondylar axis  and we placed a size 3 cutting block and made the anterior and posterior  cuts.   Tibia was subluxed forward and the menisci removed.  Extra medullary tibial  alignment guide placed surfacing proximally to the medial aspect of the  tibial tubercle and distally along the second metatarsal axis and tibial  crest.  Blocks pinned to remove 2 mm of the lateral defect.  Tibial  resection  was made with an oscillating saw.  Proximal tibial was then  prepared with the modular drill and keel punch for the size 3 prosthesis.  The femoral preparation was then completed with the intercondylar and  chamfer cuts.   A size 3 posterior stabilizing femoral trial size 3 mobile bearing tibial  trial and a 10 mm  posterior stabilizing rotating platform insert trial  placed.  Full extension achieved with excellent varus and valgus balance  throughout full range of motion.  The patella is everted, it was measured to  be 21 mm, a free-hand resection taken to 12 mm and 38 template placed.  Local hole was drilled, trial patella placed and it tracks normally.  The  osteophytes were then removed off the posterior femur with the trial in  place.  All trials were then removed and the cut bone surfaces prepared with  pulsatile lavage.  Cement is mixed and once ready for implantation, the size  3 mobile bearing tibial tray, size 3 posterior stabilized femur and 38  patella  cemented into place and patella is held with a clamp.  The 10 mm  trial is placed, the knee held in full extension and all extruded cement  removed.  Once the cement  had fully hardened and the permanent 10 mm  posterior stabilized rotating platform insert is placed into the tibial  tray.  The tourniquet is then released for a total time of 41 minutes.  Minor bleeding stopped with cautery.  Extensor mechanism is closed over a  Hemovac drain.  I left open the arthrotomy laterally from the superior to  the inferior aspect of the patella to serve as a small lateral release.  Once this is completed, then the flexion against gravity was 140 degrees and  there was no gapping of the incision.  Subcutaneous tissue was then closed  with interrupted 2-0 Vicryl, subcuticular running 4-0 Monocryl.  Incision  was cleaned and dried and Steri-Strips and a bulky sterile dressing applied.  She was then awakened and transported to the recovery room in stable  condition.                                                Ollen Gross, M.D.    FA/MEDQ  D:  11/22/2002  T:  11/22/2002  Job:  [id number]

## 2010-07-31 NOTE — Op Note (Signed)
NAMEALAZAE, CRYMES                   ACCOUNT NO.:  [id number]   MEDICAL RECORD NO.:  [id number]          PATIENT TYPE:  OBV   LOCATION:  0098                         FACILITY:  [facility]   PHYSICIAN:  Ollen Gross, M.D.    DATE OF BIRTH:  January 21, 1941   DATE OF PROCEDURE:  03/09/2004  DATE OF DISCHARGE:                                 OPERATIVE REPORT   PREOPERATIVE DIAGNOSIS:  Right paraprosthetic patellar fracture.   POSTOPERATIVE DIAGNOSIS:  Right paraprosthetic patellar fracture.   PROCEDURE:  Open reduction and internal fixation right paraprosthetic  patellar fracture.   SURGEON:  Ollen Gross, M.D.   ASSISTANT:  Alexzandrew L. Julien Girt, P.A.   ANESTHESIA:  Spinal.   ESTIMATED BLOOD LOSS:  Minimal.   DRAINS:  None.   TOURNIQUET TIME:  20 minutes at 300 mmHg.   COMPLICATIONS:  None.   CONDITION:  Stable to the recovery room.   BRIEF CLINICAL NOTE:  Michelle is a 70 year old female with severe rheumatoid  arthritis who had a right total knee arthroplasty performed approximately 1  1/2 years ago.  She did fantastic until a few months ago when she had  sustained an injury to the knee.  An effusion was noted and a radiograph  showed a nondisplaced paraprosthetic patellar fracture.  We treated with  immobilization but over time, the fragments have started to separate.  She  has about a 15 degree extensor lag and had full extension prior to this  fracture.  Given that the fragments are separating and that she is  developing some extensor dysfunction, we decided to attempt to fix this  fracture.   PROCEDURE IN DETAIL:  After successful administration of spinal anesthetic,  a tourniquet was placed high on the right thigh and the right lower  extremity prepped and draped in the usual sterile fashion.  The extremity  was wrapped in Esmarch and tourniquet inflated to 300 mmHg.  A previous  midline incision was utilized and the skin cut with a 15 blade through the  subcutaneous tissue  to the extensor mechanism.  A small medial parapatellar  arthrotomy was made.  We identified the fracture line.  There was a  significant amount of fibrous tissue inbetween the fragments.  The fibrous  tissue was excised.  The patellar component is stable.  There is a little  bit of bone loss present.  I was able to reduce the two fragments together  after removing all the soft tissue.  I freshened up the bone edges  especially on the superior fragment.  We held it together with a towel clip  and two 0.0625 K-wires were driven from inferior to superior to hold the  fracture intact.  We then passed a 16 gauge wire circumferentially around  the patella and tightened the wire to serve as a tension band.  This  effectively closed the gap between the bone and had a very stable reduction.  I was able to flex her down to 100 degrees without any gaping at all.  We  then copiously irrigated with saline solution.  I also  put down the K-wires  to appropriate configuration and bent them so they would not migrate.  The  tourniquet was then released and the arthrotomy was closed with interrupted  #1 PDS.  We oversewed the tissue overlying the patella with PDS, also.  Flexion against gravity is about 110 degrees without any gaping.  The subcu  was closed with interrupted 2-0 Vicryl, the subcuticular with running 4-0  Monocryl.  The incision was cleaned and dried, Steri-Strips and a bulky,  sterile dressing was applied.  She was placed into a knee immobilizer,  awakened, and transferred to the recovery room in stable condition.     Drenda Freeze   FA/MEDQ  D:  03/09/2004  T:  03/09/2004  Job:  [id number]

## 2010-07-31 NOTE — Discharge Summary (Signed)
[name], [name]                   ACCOUNT NO.:  [id number]   MEDICAL RECORD NO.:  [id number]          PATIENT TYPE:  INP   LOCATION:  1430                         FACILITY:  [facility]   PHYSICIAN:  Vella Raring, MD DATE OF BIRTH:  03/25/1940   DATE OF ADMISSION:  12/28/2004  DATE OF DISCHARGE:  01/03/2005                                 DISCHARGE SUMMARY   PRIMARY CARE PHYSICIAN:  Gretta Arab. Valentina Lucks, M.D.   RHEUMATOLOGIST:  Areatha Keas, M.D.   REASON FOR HOSPITALIZATION:  The patient was in acute renal failure with a  creatinine of 7.4.  Her prior creatinine baseline, from March 2006, was 0.9.  There was also a question of a upper GI bleed and the patient had a history  of vomiting black material, black emesis.  The patient also had elevated CPK  to 1,000 on admission and was thought to be in rhabdomyolysis.  The patient  also had leukocytosis on admission.  So, again on admission her diagnoses  were hematemesis, acute renal failure, rhabdomyolysis, leukocytosis with  bandemia.   Significant findings, again on admission the patient had a creatinine of  7.4.  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  An MRI of the brain without contrast repeated the same day showed  no acute intracranial abnormality.  Specifically, there was no acute infarct  seen within the left pons.  This finding on recent CT was thought to have  been due to beam hardening.  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  Third finding was scattered white matter  changes are re-demonstrated and fourth finding was significant degradation  of study by patient motion despite sedation.   HOSPITAL COURSE:  The patient was admitted on December 28, 2004.  On  admission, the patient gave a history of not feeling  well for the past few  weeks and the day prior to admission she apparently was having nausea and  vomiting but with no abdominal pain.  She eventually had vomit appearing all  black and came in.  The patient does not remember exactly what happened but  does remember she did feel so weak she lay on the ground for six hours  before coming in.  On admission, again the patient had a white count of 17.2  with a 92% shift.  She had a BUN of 46 and a creatinine of 7.4.  Her vital  signs, the patient was tachycardic.  Her heart rate was 125 on initial  presentation.  Blood pressure was 139/75, sating 96% on room air.  Secondary  to her laboratory findings and history, GI and renal were consulted.  In  regards to her renal failure, it was thought to be multifactorial possibly  due to rhabdomyolysis.  She had a CPK of 1,000 on admission with dehydration  and possible medication use.  Apparently, the patient  has multiple medicines  at home.  In regards to upper GI bleed, the patient's H&H on admission was 10.6/31.4  and over the next two days her H&H remained stable.  The last H&H was 10/29.  Given her history of rheumatoid arthritis and cerebral subluxation, GI did  not feel it was necessary to endoscope her unless there was any gross  bleeding which the patient did not have on this presentation.  Again in  regards to her renal failure, per renal __________ checked for possible  interstitial nephritis.  Urine stain, no eosinophils were seen.  The patient  had a renal sono which did not show any evidence of obstruction.  The  patient was simply hydrated as with the return to normal creatinine.  At  discharge her creatinine was 1.0, and her CK level was 97 on discharge which  is much improved from original creatinine kinase of 1,096 on initial  presentation.  In regards to the patient's imaging studies, the MRI did not reveal any  acute or subacute abnormalities.  In regards to the patient's original  leukocytosis on presentation, she was  started on vancomycin and Primaxin empirically.  Blood cultures taken were  all negative, no growth.  She also had C. diff toxin.  The patient reported  diarrhea one day during hospital course and that was also negative.  She  also had a urine culture done that was also negative.  The patient also had  a chest x-ray done which showed no acute findings, no infiltrates.  Again, the patient's renal failure resolved during hospital admission.  There was no more evidence of bleeding, upper GI bleed.  The patient was  started on IV Protonix and then continued on p.o. Protonix during her  hospitalization.  This was good prophylaxis as the patient is also on  prednisone daily for her rheumatoid arthritis.  In regards to the patient's discharge plans:  The patient was evaluated and  chose to go home with services.  The patient has a home health need and will  be getting home health services at home.  This decision came about after a  family meeting and numerous meetings with Child psychotherapist.  The patient's  daughter has felt that the patient would be better served by going to an  assisted living facility but the patient was competent and decided she would  prefer to go home with services.  She was also seen by psychiatry during her  hospital admission whose impression was the patient was not at risk to harm  herself and does have consent capacity.  He recommended no change in her  Wellbutrin dose but increasing her dose of Prozac that she was on.  The  patient's leukocytosis on initial presentation resolved.  On December 30, 2004, her white count was 8.3 and empiric antibiotics were stopped.   CONDITION ON DISCHARGE:  Stable.   DISCHARGE MEDICATIONS:  1.  Bupropion 200 mg p.o. daily.  2.  Prozac 10 mg p.o. daily.  3.  Plaquenil 200 mg p.o. b.i.d.  4.  Folic acid 1 mg p.o. daily. 5.  Protonix 40 mg p.o. daily.  6.  Prednisone 10 mg p.o. daily.  7.  Imodium  2 mg p.o. after loose bowel movement p.r.n.  8.  __________ 12.5 mg every day p.r.n.  9.  Zovirax ointment apply b.i.d. to cold sores p.r.n.   The patient was instructed to follow up with Dr. Maurice Small, her primary  care doctor and Dr.  Rowe each within one week.           ______________________________  Vella Raring, MD     LNB/MEDQ  D:  01/05/2005  T:  01/05/2005  Job:  [id number]

## 2010-07-31 NOTE — Discharge Summary (Signed)
[name], [name]                   ACCOUNT NO.:  [id number]   MEDICAL RECORD NO.:  [id number]          PATIENT TYPE:  INP   LOCATION:  1521                         FACILITY:  [facility]   PHYSICIAN:  Ollen Gross, M.D.    DATE OF BIRTH:  12/23/1940   DATE OF ADMISSION:  04/02/2005  DATE OF DISCHARGE:  04/08/2005                                 DISCHARGE SUMMARY   ADMITTING DIAGNOSES:  1.  Left trimalleolar ankle fracture.  2.  Rheumatoid arthritis.  3.  Gastroesophageal reflux disease.  4.  Anxiety.  5.  Depression.  6.  Hiatal hernia.  7.  Urinary incontinence.  8.  History of anemia.   DISCHARGE DIAGNOSES:  1.  Left trimalleolar ankle fracture, status post ORIF of the left ankle.  2.  Rheumatoid arthritis.  3.  Gastroesophageal reflux disease.  4.  Anxiety.  5.  Depression.  6.  Hiatal hernia.  7.  Urinary incontinence.  8.  History of anemia.   PROCEDURE:  April 03, 2005 - ORIF, left ankle fracture.  Surgeon - Dr.  Lequita Halt.  Assistant - Avel Peace, P.A.C.  Anesthesia - spinal.  Minimal  blood loss.  Tourniquet time 33 minutes at 300 mmHg   BRIEF HISTORY:  A 70 year old female well known to Dr. Lequita Halt with severe  rheumatoid arthritis.  She fell at home on the date of admission, sustaining  a left trimalleolar ankle fracture, admitted to the hospital.   LABORATORY DATA:  Admission hemoglobin of 11.7, hematocrit 34.3, white cell  count 13.0.  Postoperative hemoglobin 11.  Differential on admission CBC  normal.  PT and PTT on admission 13.6 and 26 respectively.  INR 1.0.  Serial  protimes followed. PT INR 26.0 and 2.4.  BMET on admission revealed an  elevated BUN of 30.  The remaining BMET within normal limits.  Serial BMETs  were followed.  Follow up BMET revealed glucose went up to 187.  The  remaining BMET within normal limits.  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  Intraoperative C Spot films for ORIF,  ankle.  Left ankle and foot films - medial malleolar fracture, distal  fibular fracture, disruption of the ankle mortis (preoperative).  A 2-view  chest on admission, April 02, 2005, revealed no active disease.  An EKG  dated April 03, 2005 revealed sinus tachycardia, left axis deviation.  When compared with previous EKG of December 28, 2004, the previous EKG is  present - no significant change was found.   HOSPITAL COURSE:  Admitted to [facility] and placed on bed rest  for the above problems.  She was preoperative.  Taken to the operating room  on the following day and underwent the above-stated procedure.  She  tolerated it well.  She was doing pretty well with pain control on day #1  postoperatively.  Discharge planning was consulted to assist with  disposition.  PT and OT were  consulted to assist with gait training,  ambulation and ADL's.  She was placed on strict nonweightbearing to the left  lower extremity.  It was noted at the time of surgery that she had a  superficial blood blister on the plantar aspect of the right great toe.  Fortunately, this ruptured later on during the hospital course.  It was  treated with dressings, and a Darco wet shoe.  She was slowly progressing  with physical therapy, who felt that she would require skilled nursing  facility once the patient was stable.  A facility was sought out, FL2 was  signed and sent.  She received therapy daily, and when she was not up  ambulating it was encouraged that the cast splint be elevated and iced.  It  was noted that a bed was found at [facility] in [place], and  after the weekend, on Monday, April 08, 2005, a bed was ready.  The  patient was stable and transferred at that time.   DISCHARGE PLAN:  1.  The patient was transferred to [facility] in  [place] on      April 08, 2005.  2.  Discharge diagnoses - please see above.   DISCHARGE MEDICATIONS/CURRENT MEDICATIONS:  1.  Protonix 40 mg daily.  2.  Wellbutrin 300 mg XL daily.  3.  Lyrica 100 mg p.o. q.h.s.  4.  MSIR morphine sulfate 30 mg p.o. q.i.d.  5.  Coumadin protocol for a total of 3 weeks.  Please titrate the INR      between 2.0 and 3.0 for a minimum of 3 weeks from the date of surgery.  6.  Prednisone 10 mg daily.  7.  Neurontin 300 mg p.o. q.h.s.  8.  Plaquenil 200 mg p.o. b.i.d.  9.  Prozac 20 mg daily.  10. Folate tablet.  11. Vitamin B-12 1 mg.  12. Folic acid 2.5 mg.  13. Vitamin B-6 25 mg p.o. daily.  14. MS Contin 100 mg SR p.o. q.8h. p.r.n.  15. Robaxin 500 mg p.o. q.6h. p.r.n. spasm.  16. Senokot S one p.o. q.h.s. p.r.n.  17. Meclizine (which is Antivert) 12.5 mg p.o. p.r.n.  18. Ativan 1 mg p.o. q.8h. p.r.n.   DIET:  As tolerated.   ACTIVITY:  She is strict nonweightbearing to the left lower extremity.  She  may be weightbearing as tolerated to the right lower extremity with a Darco  wet shoe.  Keep the splint clean and dry.  Elevate and ice.  Elevate above  the level of the heart when she is not up ambulating or sitting up for  meals.   FOLLOW UP:  The patient will follow up next week in the office in  approximately 2 weeks from surgery.  Please contact the office at [phone]  to help arrange appointment time and transfer of the patient.  Please  coordinate with the patient for appropriate time and scheduling, to follow  up next week.   DISPOSITION:  [facility].   CONDITION ON DISCHARGE:  Slowly improving.      Alexzandrew L. Julien Girt, P.A.      Ollen Gross, M.D.  Electronically Signed    ALP/MEDQ  D:  04/08/2005  T:  04/08/2005  Job:  [id number]

## 2010-07-31 NOTE — Op Note (Signed)
   NAME:  [name], [name]                             ACCOUNT NO.:  [id number]   MEDICAL RECORD NO.:  [id number]                   PATIENT TYPE:  INP   LOCATION:  5022                                 FACILITY:  [facility]   PHYSICIAN:  Thera Flake., M.D.             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   OPERATION:  1. I&D of foot, soft tissue and bone.  2. Application of VAC.   SURGEON:  Dyke Brackett, M.D.   ANESTHESIA:  General.   ASSISTANT:  Jamelle Rushing, P.A.   PROCEDURE IN DETAIL:  After full prep and drape, by and large, the foot  wound which measured probably 8 cm x 3 cm, oval-shaped defect, was clean.  There was some necrotic muscle and some bony debris, which was debrided.  There was no obvious purulence.  Some nonviable muscle of the adductor  muscle to the foot was debrided as well.  Again, due to the complex  situation relative to the patient's immune status, VAC was placed under 120  mm of suction.  A suction seal was obtained.  She was taken to the recovery  room in stable condition.                                               Thera Flake., M.D.    WDC/MEDQ  D:  10/15/2001  T:  10/17/2001  Job:  [id number]

## 2010-07-31 NOTE — H&P (Signed)
NAME:  [name], [name]                             ACCOUNT NO.:  [id number]   MEDICAL RECORD NO.:  [id number]                   PATIENT TYPE:  INP   LOCATION:  0456                                 FACILITY:  [facility]   PHYSICIAN:  Ollen Gross, M.D.                 DATE OF BIRTH:  Oct 29, 1940   DATE OF ADMISSION:  11/22/2002  DATE OF DISCHARGE:                                HISTORY & PHYSICAL   DATE OF OFFICE VISIT AND HISTORY AND PHYSICAL:  November 12, 2002   CHIEF COMPLAINT:  Right knee pain.   HISTORY OF PRESENT ILLNESS:  The patient is a 70 year old female, well known  to Ollen Gross, M.D.  She had previously undergone a left total knee  arthroplasty earlier this year and has been doing excellent in her rehab.  She initially presented with bilateral knee pain secondary to advanced  rheumatoid arthritis.  She has done so well with her left knee, now comes  back and would like to have surgical intervention on her right knee.  She is  seen in the office, and the x-rays of the left knee show the prosthesis to  be in excellent position.  Her right knee films do show end-stage arthritis  secondary to rheumatoid changes and is felt to be an appropriate candidate.  Risks and benefits of the procedure have been discussed with the patient,  and she elects to proceed with surgery.   ALLERGIES:  PENICILLIN causes hives.   INTOLERANCES:  1. SULFA causes GI upset.  2. DEMEROL makes her crazy and delusional.   CURRENT MEDICATIONS:  MS Contin, MSIR, Plaquenil, prednisone, Fosamax,  Wellbutrin, Prozac, Ambien, and Detrol.   PAST MEDICAL HISTORY:  1. Fibromyalgia.  2. Rheumatoid arthritis.  3. Gastroesophageal reflux disease.  4. Past history of staph infection in the right foot.  5. Urinary incontinence.   PAST SURGICAL HISTORY:  1. Left total knee arthroplasty, September 20, 2002.  2. She has undergone a hysterectomy.  3. Hemorrhoidectomy.  4. Past I&D of the right foot.   SOCIAL  HISTORY:  Divorced, two children, denies use of tobacco products or  alcohol products.  She does have a ramp leading into her home.   FAMILY HISTORY:  Mother with a history of hypertension.  Father with a  history of arthritis and blood problems.   REVIEW OF SYSTEMS:  GENERAL:  No fevers or chills.  Some occasional night  sweats.  NEUROLOGIC:  No seizures, syncope, paralysis.  RESPIRATORY:  She  occasionally has some shortness of breath on exertion.  No shortness of  breath at rest.  No productive cough or hemoptysis.  CARDIOVASCULAR:  No  chest pain, angina, or orthopnea.  GI:  Past history of some chronic  diarrhea.  No nausea, vomiting.  No blood or mucus in the stool.  GU:  No  dysuria, hematuria, discharge.  MUSCULOSKELETAL:  Pertinent at the knee,  found in the history of present illness.   PHYSICAL EXAMINATION:  VITAL SIGNS:  Pulse 70, respirations 16, blood  pressure 122/78.  GENERAL:  The patient is a 70 year old white female, well-nourished, well-  developed, in no acute distress.  She is alert, oriented, and cooperative,  appears to be an excellent historian.  HEENT:  Normocephalic, atraumatic.  Pupils are round and reactive.  EOMs are  intact.  The patient is noted to have upper dentures.  NECK:  Supple.  No carotid bruits are appreciated.  CHEST:  Clear to auscultation anterior and posterior chest walls.  No  rhonchi, rales, or wheezing is noted.  HEART:  Regular rate and rhythm, no murmurs.  S1, S2 noted.  ABDOMEN:  Soft, slightly round, nontender.  Bowel sounds presents.  RECTAL/BREASTS/GENITALIA:  Not done.  Not pertinent to present illness.  EXTREMITIES:  Significant to the right lower extremity.  The right knee does  show a 20-degree valgus deformity malalignment, range of motion of about 5-  120 degrees.  There is marked crepitus noted on passive range of motion.  Also of note is she had windswept rheumatoid deformities of both hands.   IMPRESSION:  1. 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  2. Rheumatoid arthritis.  3. Fibromyalgia.  4. Gastroesophageal reflux disease.  5. History of Staphylococcus infection, right foot.  6. Urinary incontinence.  7. Hiatal hernia.    PLAN:  The patient admitted to [facility] to undergo a right total  knee replacement arthroplasty.  Surgery will be performed by Ollen Gross,  M.D.     Alexzandrew L. Julien Girt, P.A.              Ollen Gross, M.D.    ALP/MEDQ  D:  11/24/2002  T:  11/24/2002  Job:  [id number]   cc:   Areatha Keas, M.D.  [address]  [address]  [place]  [place] [address]  Fax: [phone]   Ollen Gross, M.D.  [address]  [place]  [place] [address]  Fax: [phone]

## 2010-07-31 NOTE — Discharge Summary (Signed)
NAME:  [name], [name]                             ACCOUNT NO.:  [id number]   MEDICAL RECORD NO.:  [id number]                   PATIENT TYPE:  INP   LOCATION:  5022                                 FACILITY:  [facility]   PHYSICIAN:  Dyke Brackett, M.D.                 DATE OF BIRTH:  1941/02/27   DATE OF ADMISSION:  10/08/2001  DATE OF DISCHARGE:  10/23/2001                                 DISCHARGE SUMMARY   ADMISSION DIAGNOSES:  1. Severe rheumatoid arthritis on strong disease-modifying antirheumatic     drugs.  2. Osteomyelitis foot abscess, right foot.  3. Hiatal hernia.  4. Gastroesophageal reflux disease with chronic use of strong narcotics.   DISCHARGE DIAGNOSES:  1. Right foot abscess with Staphylococcus aureus with slow healing wound.  2. Severe rheumatoid arthritis on strong disease-modifying antirheumatic     drugs.  3. Chronic use of strong narcotics.  4. History of hiatal hernia.  5. History of gastroesophageal reflux disease.  6. History of fibromyalgia.   HISTORY OF PRESENT ILLNESS:  The patient is a 70 year old female initially  admitted under Dr .Sherene Sires service for right foot pain, swelling,and  inflammation.  No other initial medical history was obtained.  Due to the  fact that Dr. Thurston Hole was unavailable for surgical debridement, the patient  was transferred to Dr. Candise Bowens service and had a history of severe  rheumatoid arthritis on strong DMARDs but had a rapid onset of an infected-  appearing abscess along the medial arch of the right foot.  It was felt that  due to the patient's immunocompromised state an aggressive incision and  drainage was warranted for evaluation in order to prevent further spread of  this infection.   ALLERGIES:  PENICILLIN, SULFA, DEMEROL.   CURRENT MEDICATIONS:  1. Prednisone 10 mg p.o. q.d.  2. Plaquenil 20 mg b.i.d.  3. Arava 20 mg p.o. q.d.  4. Wellbutrin 150 mg p.o. q.d.  5. Nexium 40 mg p.o. q.d.  6. Premarin 0.625 mg p.o.  q.d.  7. Fosamax 70 mg p.o. q. week.  8. MS Contin.  9. NSIR.  10.      Detrol p.r.n.  11.      Allegra D p.r.n.  12.      Injection Humera per Dr. Renaldo Reel office.   SURGICAL PROCEDURE:  The patient was taken to the OR twice.  Initial was on  August 1st by Dr. Madelon Lips assisted by Jamelle Rushing, PA-C.  Under general  anesthesia the patient underwent decompression of abscess right foot with a  bone biopsy and debridement of the metatarsal joint.  The patient's wound  appeared to be red and extremely inflamed.  There was no obvious purulent  material and bone biopsy was obtained and sent for evaluation.  The  patient's wound was moist to dry dressing intact, and she was transferred to  the recovery  room and then to the orthopedic floor for routine postoperative  care.   On August 7th the patient was taken back to the OR by Dr. Madelon Lips, assisted  by Jamelle Rushing, PA-C and under general anesthesia the patient had a  second I&D of the foot of the soft tissue and bone with application of a  vac.  The patient's soft tissue continued to be red and inflamed.  Some  small areas of necrotic muscle and bony debris were removed, and a vac was  applied to 120 mm of suction.  A good seal was obtained.  The patient was  transferred to the recovery room and to the orthopedic floor for routine  postoperative care.   CONSULTS:  An infectious disease consult was requested for evaluation of the  patient's infection and immunocompromised state for best options of  treatment and length of treatment.  A plastic surgery consult was requested  for evaluation of closure of the wound.   HOSPITAL COURSE:  On October 08, 2001 the patient was initially admitted to  [facility] under the care of Dr. Thurston Hole.  Due to the fact that he  was unable to be available for surgical evaluation and debridement, the  patient was then transferred over to Dr. Candise Bowens office.  It was felt that  this patient warranted  rapid I&D of her cellulitic abscess of her right foot  due to her significant immunocompromised state due to her strong DMARDS and  prevent further spread and evaluation of possible osteomyelitis.  Arrangements were made and the patient was taken to the OR for her first I&D  on August 1st.  The patient then incurred a total of 15 days hospital care.  She did have a total of two I&Ds.  On the second trip to the OR a vac was  placed to promote shrinkage of the edematous soft tissue in the area of the  abscess and to promote wound closure.   A plastics consult was requested for evaluation of closure of the wound.  Dr. Odis Luster initially wanted to allow the vac to work several days prior to  making a decision about whether just vac treatment versus a skin graft  closure would be the best option for this patient.  Upon evaluation, he did  feel that the quickest and best option of closure of this wound would be a  skin graft.  Initially the patient agreed but the following day, due to fear  of discomfort with a skin graft site, the patient refused a skin graft and  she opted for 6-8 weeks of bed rest with the vac placement for self-  granulation of the wound with closure.  Both Dr. Madelon Lips and Dr. Odis Luster  discussed with the patient this option and they did not have any feelings  that this was unwise so they agreed.  During the patient's hospitalization  the patient did have some issues with some agitation and pain control  issues.  Her medications were readjusted and these were brought under  control quickly without any untoward events.   Infectious disease services were requested due to the patient's significant  immunosuppressed state and felt that initial vancomycin treatment would be  warranted until cultures returned.  Once the cultures returned the patient  was found to have a Staph aureus which was susceptible to Tequin so she was placed on Tequin with recommendations of management for  approximately two  weeks.  The patient was placed on Tequin without  any complications or  problems and her care was continued with this.   After insurance services approved coverage for the patient as an outpatient  vac patient, arrangements were made and the patient was discharged to home  on August 9th with arrangements made for home health nurse to manage her vac  over her wound on her right foot.  The patient was comfortable.  She had no  specific complaints.  Her vital signs were stable.  She was afebrile.  Her  right foot erythema had virtually completely resolved but she did have a 3  inch by 1 inch open wound along the medial arch on her right foot which did  appear to have early granulation and wound healing.  The patient was  discharged to home with followup recommendations with Dr. Odis Luster in plastic  surgery and Dr. Madelon Lips two weeks from discharge.   LABORATORY DATA:  EKG on admission was normal sinus rhythm at 91 beats per  minute.  Chest x-ray on admission was no acute abnormalities.  On August 9th  WBC were 9.0, hemoglobin 10.8, hematocrit 32.9, platelets 309.  Routine  chemistries on August 6th:  Sodium 137, potassium 4.0, glucose 113, BUN 11,  creatinine 0.9.  Routine urinalysis on admission was normal.  Blood cultures  from July 31st showed no growth after five days.  Urine culture on July  31st, no growth after one day.  August 1st abscess culture showed few Staph  aureus.  No aerobes isolated.   MEDICATIONS UPON DISCHARGE FROM ORTHO FLOOR:  1. Lomotil 2-4 tablets p.o. q.d. p.r.n.  2. Plaquenil 200 mg p.o. b.i.d.  3. Prednisone 10 mg p.o. q.d.  4. Naproxen 375 mg p.o. b.i.d.  5. Wellbutrin 150 mg p.o. b.i.d.  6. Ambien 10 mg p.o. q.h.s. p.r.n.  7. Detrol 4 mg p.o. q.d.  8. Arava 20 mg p.o. q.d.  9. Prozac 90 mg p.o. q. week.  10.      Tylenol 650 mg p.o. q. 6h p.r.n.  11.      Tequin 400 mg p.o. q.d.  12.      Protonix 40 mg p.o. q.d.  13.      Multivitamins  with minerals p.o. q.d.  14.      Xanax 0.5 mg p.o. q.8h p.r.n.  15.      MS Contin 100 mg q.d.  16.      NSIR 30 mg 2-3 tabs p.o. t.i.d. p.r.n.  17.      Humera injections placed on hold per Dr. Phylliss Bob, one injection every     two weeks.   DISCHARGE INSTRUCTIONS:  1. Patient is to continue routine medications as directed by Dr. Renaldo Reel     office.  2. Tequin 400 one tablet q.d. for two weeks.  3. Pain management as per Dr. Renaldo Reel instructions.   ACTIVITY:  No weight on right foot.  Bed to chair only.  Otherwise elevate  the right foot.   DIET:  No restrictions.   WOUND CARE:  Keep the foot dry.  Patient is to have vac on the wound 24  hours a day and have it changed three times a week per Dr. Maxcine Ham request.    FOLLOWUP:  Call for followup evaluation with Dr. Maxcine Ham office in two weeks  and then see Dr. Madelon Lips.   CONDITION ON DISCHARGE TO HOME:  Improved.    Jamelle Rushing, P.A.  Dyke Brackett, M.D.    RWK/MEDQ  D:  11/04/2001  T:  11/06/2001  Job:  [id number]   cc:   Teena Irani. Odis Luster, M.D.   Areatha Keas, M.D.  [address]  [address]  [place]  [place] [address]  Fax: [phone]

## 2010-07-31 NOTE — Consult Note (Signed)
[name], [name]                   ACCOUNT NO.:  [id number]   MEDICAL RECORD NO.:  [id number]          PATIENT TYPE:  INP   LOCATION:  0164                         FACILITY:  [facility]   PHYSICIAN:  James L. Deterding, M.D.DATE OF BIRTH:  1941/01/09   DATE OF CONSULTATION:  DATE OF DISCHARGE:                                   CONSULTATION   We are asked to see this patient by Dr. Jomarie Longs, hospitalist, for evaluation  of acute renal failure.   HISTORY OF PRESENT ILLNESS:  This is a 70 year old female with at least a 20  year history of rheumatoid arthritis, who apparently yesterday, according to  the dictated note.  She cannot give that history now because of confusion  and is only oriented x1.  Apparently, she was a little more oriented but  fuzzy in the emergency room also.  There is some question as if she might  have had a stroke.  Only a subacute lesion was found on MRI.  She was found  to have leukocytosis.  Her creatinine was 7.4.  There was some question of  rhabdomyolysis; however, a CK was not even drawn.  She did have positive  cardiac enzymes.  She has a history of nausea with coffee-ground emesis,  apparently since yesterday.  Difficulty voiding for 72-96 hours.  No fevers,  chills, or history of renal disease.  She complains of edema and nocturia.  Her serum creatinine was 0.9 in March, 2006.   Recent history of worsening confusion.  History of RA with multiple  complications.  She has had multiple bilateral knee operations.  She had two  operations for a staph infection.  She had a hysterectomy.  A history of C1-  2 subluxation.  History of vertigo.  Sleep apnea.  Chronic pain syndrome.  Peripheral neuropathy.  Depression.  GERD.  Myositis.  Fibromyalgia.  Despite her confusion, she apparently still lives by herself.   Her medications at home include Lyrica, folic acid, MSIR, MSCR, Protonix,  prednisone, meclizine, Fosamax, Wellbutrin, Plaquenil, and Rituxan.   Her  creatinine now is 7.4.   ALLERGIES:  1.  PENICILLIN.  2.  SULFA.  3.  REACTION TO DEMEROL.  It does not sound like an allergy, but she got      confused on it.   PAST MEDICAL HISTORY:  She has had the operations listed above.  Multiple  hospitalization for rheumatoid disease.  Multiple complications from  rheumatoid disease.   No family history of diabetes, hypertension.  No family history of renal  disease.  Her daughter is here to help with that family history.   REVIEW OF SYSTEMS:  Obtained mostly from her daughter.  In general, she  apparently has been eating fairly well recently.  No decrease in intake, per  say.  She notes the edema.  She denies any shortness of breath.  Daughter  says she does not use her CPAP mask at lot at night.  HEENT:  She has been  evaluated by Dr. Perry Mount, one of the neurologist, for evaluation of  vertigo.  Etiology  is not real clear.  She has been treated with Valium, in  addition to her current medications of meclizine.  She has had a little bit  of sore throat.  CARDIOVASCULAR:  She denies shortness of breath or PND.  Sleeps on two pillows.  Nocturia x2.  GI:  She has frequent reflux.  The  nausea and vomiting at the current time.  No diarrhea or bloody, black  stools.  She has no history of hepatitis; however, her daughter and her son  have Wilson's disease.  It is a very positive family history.  This is only  found out after we started this above.  SKIN:  Very fragile skin, a lot of  bruises.  MUSCULOSKELETAL:  She aches in her back.  Really no leg aches.  Her arm aches at the current time.  She has pain in her hands chronically.  It is not a severe problem for her.  No red, hot swollen joints, just  chronic aches.  Daughter says multiple times she has had prednisone bursts  in the last year.  She is on the Rituxan IV at the current time via  protocol.   OBJECTIVE:  GENERAL:  She is confused.  Asterixis.  HEENT:  A black film on her tongue.   Fundi are unremarkable.  NECK:  No thyromegaly or nodules.  She has posterior cervical adenopathy.  LUNGS:  Decreased breath sounds but no rales, rhonchi or wheezes.  Decreased  expansion.  ABDOMEN:  Positive bowel sounds but decreased.  Diffuse mild tenderness.  No  true guarding.  SKIN:  A lot of bruises in her arms.  She is very pale overall.  MUSCULOSKELETAL:  She has subluxation at the wrists.  Some extensor nodules  on the arms and the legs.  She also has subluxation at the MCPs with ulnar  drift.  She has swan neck and boutonniere deformities in both hands.  A  cushingoid appearance overall.   LABORATORY DATA:  A pO2 of 58, pCO2 41, pH 7.21, hemoglobin 11.7, white  count 17,200, platelets 419.  Sodium 132, potassium 5.1, chloride 93,  bicarbonate 18, creatinine 7.4, BUN 46, glucose 71.  UA:  Large blood, 100  mg% protein, not commented on red cells, 0-2 white cells, CK-MB of 35.3.  Myoglobin greater than 500.  Calcium 8.2, lactic acid 1.4.   ASSESSMENT:  1.  Acute renal failure, not real consistent with rhabdomyolysis; however,      it is conceivable.  If not, however, in six hours usually would not do      that.  The creatinine of 7.4 is way too high for that.  Suspect this has      developed over a 7-10 day period and most consistent with a drug      toxicity.  Cannot rule out nonsteroidals at this time but multiple other      potential offenders here.  She will need to rule out sepsis also and      obstruction.  She is acidemic, hyperkalemic, and uremic.  If not better,      needs transfer this p.m. to [facility].  2.  Confusion, asterixis.  Question of drug.  She is also uremic.  3.  Increased white blood count:  Cultures and antibiotics given.  She is      very clearly immunocompromised in a formerly compromised state.  4.  Rheumatoid arthritis:  Per the primary service.  5.  Vertigo.  6.  Depression.  7.  Metabolic bone disease.   PLAN:  1.  Ultrasound.  2.  Repeat  UA. 3.  Cultures.  4.  Urine for eosinophils.  5.  Check CK and LDH.  6.  Isotonic sodium bicarbonate.           ______________________________  Llana Aliment. Deterding, M.D.     JLD/MEDQ  D:  12/28/2004  T:  12/28/2004  Job:  [id number]

## 2010-07-31 NOTE — H&P (Signed)
NAMEALLEYAH, TWOMBLY NO.:  [id number]   MEDICAL RECORD NO.:  [id number]          PATIENT TYPE:  EMS   LOCATION:  ED                           FACILITY:  [facility]   PHYSICIAN:  Hollice Espy, M.D.DATE OF BIRTH:  1940/09/17   DATE OF ADMISSION:  12/28/2004  DATE OF DISCHARGE:                                HISTORY & PHYSICAL   PRIMARY CARE PHYSICIAN:  Gretta Arab. Valentina Lucks, M.D.   RHEUMATOLOGIST:  Areatha Keas, M.D.   CONSULTATIONS:  1.  [facility].  2.  [facility].  3.  [facility].   CHIEF COMPLAINT:  Fall and hematemesis.   The patient is a 70 year old white female with past medical history of  severe rheumatoid arthritis, reported history of myositis, iron deficiency  anemia and a recent hardware removal of the right knee secondary to pain 3  months ago who presents to the emergency room with complaints of nausea,  vomiting of dark black material. The patient is a moderate historian with  additional details provided by her daughter who states that for the past few  weeks the patient has been noted to have occasional episodes of confusion.  The patient herself tells me that in the last couple of weeks she has had  intermittent episodes of blurry vision but she remembers hitting her head on  the car door and thought that might be the cause. Apparently for the last  few days, she has not been feeling well having episodes of nausea and  vomiting but with no significant abdominal pain. Apparently she became more  concerned when the vomiting all began to turn dark black and she felt like  she needed to come in. Details here are slightly vague as per the patient  but apparently one day prior to her coming in she fell. She could not  remember if she actually passed out or she actually did not lose  consciousness, but she apparently she was so weak she laid on the ground for  6 hours before she was able to call for help to come up. She was not  immediately brought in but after bringing her into the emergency room she  was noted to continue to have episodes of hematemesis. Labs were drawn on  the patient as well as a head CT. Concerning labs on this patient included a  white count of 17.2 with a 92% shift and 20% bandemia. The patient had a BUN  of 46 and a creatinine of 7.4. Previous BMETs are not available on  computerized records, however, there are transcribed reports saying that the  patient had a normal BMET as most recently as February of 2006. In addition,  the patient had a large hemoglobin, a CPK level greater than 500 with an MB  of 38.5 and a normal troponin and only few white cells and bacteria seen on  UA. In addition, the patient's CT scan showed an area of hypoattenuation in  the left aspect of the pons concerning for subacute infarct and a remote  lacunar basal ganglia infarct. Based  on MRI in November of 2005, neither  lesion was noted at that time. Currently the patient states she feels very  weak and tired. She has been telling her daughter that she has not been able  to urinate now for several days. She complains of a mild headache, she is  quite thirsty, she feels very dry. She has been complaining of intermittent  vision changes though currently she says her vision is okay. She denies any  chest pain or palpitations, no shortness of breath, wheezing, or coughing.  She denies any abdominal pain. She has been complaining of hematemesis. She  denies any hematuria but has not been able to urinate for the last several  days. She denies any constipation or diarrhea although she normally says  that she has problems with chronic diarrhea possibly thought to be secondary  to her medications. She denies any focal extremity numbness, weakness or  pain but overall feels quite weak from head to toe. Her review of systems is  otherwise negative.   PAST MEDICAL HISTORY:  Rheumatoid arthritis. She is on chronic   immunosuppressant medications including prednisone and rituxan. No reported  previous history of renal dysfunction. History of iron deficiency anemia.  History of myositis, hiatal hernia, GERD, anxiety, depression, questionable  history of narcotic dependence, history of right periprosthetic patella  fracture with hardware placement in February of 2006 and removal in July  2006. History of sleep apnea, reported history of fibromyalgia, history of  staphylococcus infection in the right foot in the past, history of urinary  incontinence. 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   MEDICATIONS:  1.  The patient was recently started on rituxan approximately a few weeks      ago.  2.  Protonix 40 daily.  3.  Morphine sulfate 100 mg extended release, not clear if this is taken on      a daily basis.  4.  Meclizine 12.5 p.r.n.  5.  Foscarnet sodium 70 mg daily.  6.  Wellbutrin 300 mg p.o. daily.  7.  Hydroxychloroquine sulfate 200 mg p.o. b.i.d.  8.  Morphine sulfate IR 30 mg 4 times a day.  9.  Folbic which is 1 tablet p.o. daily.  10. Lyrica 100 mg p.o. q.h.s.   ALLERGIES:  The patient has reported allergies to SULFA and PENICILLIN.   SOCIAL HISTORY:  She denies any tobacco, alcohol or drug use.   FAMILY HISTORY:  Noncontributory.   PHYSICAL EXAMINATION:  VITAL SIGNS:  The patient's vitals on admission,  temperature 97.4, heart rate 125 now down to 112, blood pressure initially  139/75, respirations 20, O2 sat 96% on room air.  GENERAL:  The patient appears to be pleasant. She is alert and oriented. She  appears to be in no acute distress but feels slightly anxious.  HEENT:  Normocephalic. She is noted to have dry blood in her oropharynx and  on her lips. Her mucous membranes are quite dry. She has no carotid bruits.  HEART:  Regular rhythm but tachycardic. LUNGS:  Clear to  auscultation bilaterally.  ABDOMEN:  Soft, distended, nontender.  EXTREMITIES:  Show no clubbing, cyanosis or putting edema. She is noted to  have bilateral knee scars.   LABORATORY DATA:  White count 17.2 with a 92% shift and 20% bandemia. H&H  12.7, 37.2. MCV of 82, platelet count 419. Sodium 132, potassium  5.1,  chloride 93, bicarb 18. The patient is noted to have an anion gap of 21. BUN  of 46, creatinine 7.4 with glucose 71. LFTs noted for an albumin of 3.1 and  AST of 51, total bilirubin of 1.5. LFTs otherwise unremarkable. UA notes  trace ketones, moderate bilirubin, large hemoglobin, total protein of 100  and negative leukocyte esterase. CPK greater than 500, MB 38.5, troponin I  normal at less than 0.05. UA shows few bacteria and 0-2 white cells. CT scan  of the head shows a subacute infarct questionable in the left aspect of the  pons and a remote lacunar infarction in the right basal ganglia. Lumbar  spine films show marked DJD without fracture.   ASSESSMENT/PLAN:  1.  Hematemesis with questionable upper gastrointestinal bleed. Despite the      patient's normal blood pressure, she is tachycardic. She has signs of      obvious volume depletion and gives a several day history of hematemesis.      Will start the patient on IV fluids, make her n.p.o., put her on IV      protonix, type and cross her for 2 units and check serial H&H's q.6      hours. Will also consult [facility] for possible endoscopy. This may be      secondary to the patient's use of immunosuppression drugs and possible      prednisone that may have weakened her stomach lining. I do not see any      reports of her being on any NSAIDs.  2.  Acute renal failure. Reportedly the patient has no previous history of      renal failure based on previous transcription reports and the patient      tells me she has no history of this although we do not have actual      numbers listed in the computer. The cause of this is  secondary to volume      depletion from her nausea and vomiting as well as rhabdomyolysis      following her fall. For now will discussed with [facility], put her on IV fluids and follow her CPK levels.  3.  Rhabdomyolysis. The patient has a previous history of myositis which may      or may not be a factor in this. Will continue IV fluids and follow CPK      levels.  4.  Initial cause of nausea and vomiting. This may be secondary to possible      viral gastroenteritis or from the actual GI bleed. Will check an      abdominal x-ray and put the patient on IV protonix making her n.p.o.  5.  Leukocytosis with bandemia. The patient is at risk for sepsis given      immunosuppression by her medications. Will check blood cultures x2,      urine culture, chest x-ray and once all labs are drawn will start IV      Tygacil medication which given her PENICILLIN allergy and need for broad     spectrum antibiotics as well as no renal dosing needed would be an      excellent choice.  6.  In regards to the patient's subacute cerebrovascular accident, will      consult neurology eventually but for now will check bilateral carotid      Doppler's and MRI of the head. It appears that she is already following  with Dr. Pearlean Brownie.  7.  Elevated anion gap. Causes by uremia although she appears not most      likely suspect. She has no elevated blood sugars but will go ahead and      check a lactic acid and salicylate plus an ABG.  8.  History of rheumatoid arthritis. For now, I am holding her rheumatoid      medications. Will ask Dr. Phylliss Bob, her rheumatologist, for assistance in      advising her medications.   TIME SPENT ON THIS PATIENT:  75 minutes including 20 minutes of critical  care time.   ADDENDUM:  The patient with her rheumatoid arthritis has a history of C1  subluxation. Will place her in step down and place a note of this on the  chart in the event that she needs to be  intubated will account for this.      Hollice Espy, M.D.  Electronically Signed     SKK/MEDQ  D:  12/28/2004  T:  12/28/2004  Job:  [id number]   cc:   [facility]   [facility]   Gretta Arab. Valentina Lucks, M.D.  Fax: [phone]   Areatha Keas, M.D.  Fax: [phone]   Pramod P. Pearlean Brownie, MD  Fax: [phone]

## 2010-07-31 NOTE — Op Note (Signed)
NAMEVASILIA, DISE                   ACCOUNT NO.:  [id number]   MEDICAL RECORD NO.:  [id number]          PATIENT TYPE:  AMB   LOCATION:  DAY                          FACILITY:  [facility]   PHYSICIAN:  Ollen Gross, M.D.    DATE OF BIRTH:  [birth year]   DATE OF PROCEDURE:  04/04/2004  DATE OF DISCHARGE:                                 OPERATIVE REPORT   PREOPERATIVE DIAGNOSIS:  Right periprosthetic patella fracture with failure  of fixation.   POSTOPERATIVE DIAGNOSIS:  Right periprosthetic patella fracture with failure  of fixation.   OPERATION/PROCEDURE:  Open reduction and internal fixation, right  periprosthetic patella fracture with bone grafting.   SURGEON:  Ollen Gross, M.D.   ASSISTANT:  Alexzandrew L. Perkins, P.A.-C.   ANESTHESIA:  Spinal.   ESTIMATED BLOOD LOSS:  Minimal.   DRAINS:  None.   TOURNIQUET TIME:  51 minutes at 300 mmHg.   COMPLICATIONS:  None.   DISPOSITION:  Stable to recovery.   BRIEF CLINICAL NOTE:  She is a 70 year old female with severe rheumatoid  arthritis who sustained a right periprostatic fracture a few months ago.  She had it initially open reduced and internally fixated a few weeks ago and  then tripped and went on to have failure of fixation.  She presents now for  repeat fixation with bone graft.   DESCRIPTION OF PROCEDURE:  After the successful administration of spinal  anesthesia, tourniquet was placed on the right thigh and right lower  extremity prepped and draped in the usual sterile fashion. The extremity was  wrapped in Esmarch, tourniquet inflated to 300 mmHg.   Previous midline incisions were utilized and there is some hematoma present  upon going through the skin.  The hardware has failed and pulled out a  superior fragment.  We removed the hardware including the two K-wires and a  cerclage 16-gauge wire.  The fracture site is debrided.  She did have a fair  amount of bone loss which was recognized to be a blast procedure.   The  patellar component was found to be stable.  We reduced the two fragments  together so that they would be in bony apposition near the articulation with  the patella component.  This did leave a small gap anteriorly and we packed  that with cancellous bone graft utilizing freeze-dried cancellous graft.  Then held this together with the fracture reduction clamps and took two  0.062 K-wires from inferior to superior and passed them across the fracture  site to exit through the cortex superiorly.  This effectively reduced the  fracture.  We have confirmed that it was completely reduced with the  articular side posteriorly but there was still that gap of anteriorly which  was minimal.  We immediately packed this with further bone graft.  A 16-  gauge cerclage wire has been passed superiorly and then through a figure-of-  eight, this passed inferiorly and tightened.  This helped to press down the  fracture site even further.  We put it through a range of motion and the  construct was  very stable.  The wires were then cut down and impacted.  We  put further bone graft in which did show the defect filling more on the x-  ray.  The area was then copiously irrigated with saline solution and  superficial retinaculum closed with Vicryl, subcutaneous tissue closed with  interrupted 2-0 Vicryl, and tourniquet released for a total time of 51  minutes. The skin was then closed with staples.  A bulky sterile dressing  was applied and she is awakened, placed into a knee immobilizer and  transported to recovery in stable condition.   A midline incision is  made with a 10-blade through the subcutaneous tissue  to the level of the extensor mechanism.  A fresh blade is used to make a  medial parapatellar arthrotomy and the soft tissue of proximal medial tibia  subperiosteally elevated to the joint line with the knife into the  semimembranosus bursa, first with curved osteotome.  Soft tissue is over the   proximal lateral tibia is also elevated with attention being paid to  avoiding the patellar tendon on the tibial tubercle.  Patella is then  everted and knee flexed 90 degrees.  Intercondylar osteophytes were removed  and then the ACL and PCL removed.  Drill is used to create a starting hole  in the distal femur and the canal is irrigated.  The 5-degree left valgus  alignment guide is placed and referencing off the posterior condyles,  rotations marked and the block pins moved 10 mm off the distal femur.  Distal femoral resection is made with an oscillating saw.  Sizing block is  placed and size 5 is the most appropriate.  The size 5cutting block is  placed with the rotation marked up the epicondylar axis and then the  anterior and posterior cuts were made.   Tibia was subluxed forward and the menisci removed.  Extramedullary tibial  alignment guide is placed referencing proximally to the medial aspect of the  tibial tubercle and distally along the second metatarsal axis and tibial  crest.  A block is pinned to remove 10 mm of the nondeficient medial side.  Tibial resection is made with an oscillating saw.  A size 5 is the most  appropriate tibial component and the proximal tibia is prepared with a  modular drill and keel punch for a size 5.  Femoral preparation is then  completed with the intercondylar and chamfer cuts.   Trial size 4 posterior femur stabilized with the size 5 mobile-bearing  tibial tray and a 10 mm posterior stabilized platform insert trial are  utilized.  With the 10, full extension is achieved with excellent varus and  valgus balance throughout full range of motion.  The patella is then  everted.  It was measured to 27 mm, free hand resection take it to 13 mm.  Lug holes are drilled, trial patella is placed and it tracks normally.  Osteophytes were removed off the posterior femur with the trials in place. All trials are then removed and the cut bone surfaces are then  prepared with  pulsatile lavage.  Cement is mixed and once ready for implantation, the size  5 mobile-bearing tibial tray, size 4 posterior stabilized femur and 41  patella are cemented into place and patella held with a clamp.  Once the  cement had fully hardened and the permanent 10 mm posterior stabilized  rotating platform insert is placed into the tibial tray.  The wounds were  then copiously irrigated with  antibiotic solution and the extensor mechanism  closed over a Hemovac drain with interrupted #1 PDS.  Flexion against  gravity is 120 degrees at which point to calf is hitting the thigh.  The  tourniquet released after a total time of 64 minutes.  Subcutaneous tissues  were then closed with interrupted 2-0 Vicryl, subcuticular running 4-0  Monocryl.  Incision was cleaned and dried and Steri-Strips applied.  The  catheter for the Marcaine pain pump is placed and the pain pump is  initiated.  A bulky sterile dressing is then applied.  He was placed into a  knee immobilizer, awakened and transported to recovery in stable condition.      FA/MEDQ  D:  04/04/2004  T:  04/04/2004  Job:  [id number]

## 2010-07-31 NOTE — Consult Note (Signed)
NAMEJARETZI, DROZ                   ACCOUNT NO.:  [id number]   MEDICAL RECORD NO.:  [id number]          PATIENT TYPE:  INP   LOCATION:  0164                         FACILITY:  [facility]   PHYSICIAN:  Althea Grimmer. Santogade, M.D.DATE OF BIRTH:  [birth year]   DATE OF CONSULTATION:  12/28/2004  DATE OF DISCHARGE:                                   CONSULTATION   Ms. Dexter is a 70 year old female whom I asked to see for coffee ground  emesis and mild anemia. She is admitted to the hospital for multiple medical  problems most significantly acute renal failure. She has an underlying  history of reported chronic anemia, rheumatoid arthritis, and acute renal  failure. She has apparently seen Dr. Matthias Hughs for reflux disease and was  endoscoped in the remote past. She states she takes Reglan on a p.r.n. basis  at home but takes Protonix every day. She had several episodes of coffee  grounds prior to admission to the ICU but has not had any melena or gross  emesis since being admitted. She has been intermittently confused. She was  discovered to have a BUN of 52 and a creatinine of 6.6 which are apparently  new. She denies abdominal pain. There is a note on the chart that she has a  subluxation of her cervical spine and caution is to be used with any  intubation. She denies a prior history of ulcers or colonic problems.   PAST MEDICAL HISTORY:  Pertinent for a CVA, rheumatoid arthritis, prior  history of rhabdomyolysis, chronic anemia, acute renal failure, sleep apnea,  chronic pain, neuropathy, depression, reflux, fibromyalgia and rheumatoid  arthritis.   CURRENT MEDICATIONS IN HOSPITAL:  Primaxin, vancomycin and protonix. She had  been on prednisone and rituxan as an outpatient. In addition, she was taking  __________ and hydroxychloroquine. She denies the use of aspirin or  nonsteroidal's.   ALLERGIES:  SULFA and PENICILLIN.   SOCIAL HISTORY:  No alcohol or tobacco use.   FAMILY HISTORY:   Noncontributory.   PHYSICAL EXAMINATION:  GENERAL:  She appears to be in no acute distress.  VITAL SIGNS:  Afebrile. Blood pressure 115/45, pulse is 104.  HEENT:  Eyes are anicteric, oropharynx revealed some darkish material  staining her tongue.  CHEST:  Sounds clear.  HEART:  Tachycardic.  ABDOMEN:  Soft, obese and nontender.  RECTAL:  Not performed.  EXTREMITIES:  Show marked stigmata of rheumatoid arthritis.   Hemoglobin is 10.7, on admission it was 11.7. BUN 52, creatinine 6.6. White  blood count 17.2, platelet count 419.   IMPRESSION:  Coffee grounds of questionable significance in the face of  renal failure. She probably has gastritis and she has a history of reflux.  She certainly could have peptic ulcer disease but does not seem to be  bleeding very significantly at present. She has had no melena. In light of  her history of cervical subluxation, I would not rush to endoscope her  unless there is gross bleeding.   RECOMMENDATIONS:  Keep her on IV protonix, follow hemoglobin. If she has  overt recurrent  bleeding, we will consider endoscoping at that time. I will  follow along.      Althea Grimmer. Luther Parody, M.D.  Electronically Signed     PJS/MEDQ  D:  12/28/2004  T:  12/28/2004  Job:  [id number]   cc:   Hollice Espy, M.D.   Bernette Redbird, M.D.  Fax: [phone]

## 2010-07-31 NOTE — Op Note (Signed)
NAMEMYLINDA, BROOK                   ACCOUNT NO.:  [id number]   MEDICAL RECORD NO.:  [id number]          PATIENT TYPE:  AMB   LOCATION:  DAY                          FACILITY:  [facility]   PHYSICIAN:  Ollen Gross, M.D.    DATE OF BIRTH:  1940-09-10   DATE OF PROCEDURE:  05/16/2004  DATE OF DISCHARGE:                                 OPERATIVE REPORT   PREOPERATIVE DIAGNOSIS:  Painful hardware, right knee.   POSTOPERATIVE DIAGNOSIS:  Painful hardware, right knee.   PROCEDURE:  Hardware removal, right knee.   SURGEON:  Ollen Gross, M.D.   ASSISTANT:  None.   ANESTHESIA:  Local with MAC.   ESTIMATED BLOOD LOSS:  Minimal.   DRAIN:  None.   CONDITION:  Stable to recovery.   BRIEF CLINICAL NOTE:  Suleima is a 70 year old female who has had a  periprosthetic right patellar fracture that has been very problematic.  She  has had it operatively fixed twice and then the hardware now has migrated.  The two K-wires are present from the inferior to the superior aspect of the  patella.  The medial wire has back out and is now poking the skin.  She  presents now for removal of that wire.   PROCEDURE IN DETAIL:  After successful administration of MAC anesthetic, the  right knee was prepped and draped in the usual sterile fashion.  I palpated  the inferior aspect of the wire and made a small poke incision over that.  I  was able to remove both of the K-wires with a needle driver.  We kept the  cerclage wire intact.  The wound was then irrigated the incision closed with  interrupted 4-0 nylon.  We then injected with 10 cc of 0.25% Marcaine around  the incision.  A bulky sterile dressing was applied.  She was awakened and  transported to recovery in stable condition.      FA/MEDQ  D:  05/16/2004  T:  05/16/2004  Job:  [id number]

## 2010-07-31 NOTE — Consult Note (Signed)
NAME:  [name], [name]                             ACCOUNT NO.:  [id number]   MEDICAL RECORD NO.:  [id number]                   PATIENT TYPE:  INP   LOCATION:  5022                                 FACILITY:  [facility]   PHYSICIAN:  Teena Irani. Odis Luster, M.D.               DATE OF BIRTH:  1940-12-15   DATE OF CONSULTATION:  10/19/2001  DATE OF DISCHARGE:                           PLASTIC SURGERY CONSULTATION   CHIEF COMPLAINT:  Open wound right foot.   HISTORY OF PRESENT ILLNESS:  A 70 year old woman with rheumatoid arthritis  which is severe wore an orthotic type of shoe 2 weeks ago.  She developed a  wound medial aspect to the right foot just off of the weightbearing surface.  She thinks this was due to the shoe.  This became red and painful.  She was  seen by Dr. Madelon Lips who hospitalized her and performed an incision and  drainage.  She had a vac in place for 4 days.  She has also been seen by  infectious disease and has been on IV antibiotics and is presently on  Tequin.  She says that the wound seems to be slightly better but is still  tender.   PAST MEDICAL HISTORY:  Positive for rheumatoid arthritis, positive  fibromyalgia, positive for some irritable bowel syndrome.  Negative cardiac,  renal, pulmonary disease or diabetes.  Negative for hypertension.   REVIEW OF SYSTEMS:  ORTHOPEDIC: Positive rheumatoid arthritis.  Positive C1-  C2 instability with no sign of cervical myelopathy.  Positive for  osteoporosis.  History of carpal tunnel syndrome.  GI: Positive for  irritable bowel syndrome.  Otherwise negative as reviewed in the chart  today.   SOCIAL HISTORY:  She lives locally.  She is retired and she is a nonsmoker.  She quit 7 years ago.   PHYSICAL EXAMINATION:  GENERAL: In no acute distress.  Well-developed, well-  nourished woman.  Alert and cooperative.  INTEGUMENT: There is an open wound medial aspect to the right foot just off  of the plantar surface with the abductor tendon  exposed.  There is no  evidence of cellulitis, abscess, or exudate.  VASCULAR: Posterior tibial and dorsalis pedis are intact 2+.   IMPRESSION AND PLAN:  Open wound right foot.  Recommend continue the vac.  This will improve the situation with edema as well as improve ventilation  tissue. We will reassess the wound over the next couple of days and the  remainder of this week with the idea that possibly she would undergo a skin  graft next week.  She understands that this will be some added risk to this  due to the rheumatoid drugs that she takes including prednisone.  She  understands that makes for slow healing and greater problems with wound  closure.  Teena Irani. Odis Luster, M.D.    DMB/MEDQ  D:  10/19/2001  T:  10/23/2001  Job:  [id number]   cc:   Thera Flake., M.D.

## 2010-07-31 NOTE — Discharge Summary (Signed)
[name], [name]                   ACCOUNT NO.:  [id number]   MEDICAL RECORD NO.:  [id number]          PATIENT TYPE:  INP   LOCATION:  0460                         FACILITY:  [facility]   PHYSICIAN:  Ollen Gross, M.D.    DATE OF BIRTH:  [birth year]   DATE OF ADMISSION:  03/09/2004  DATE OF DISCHARGE:  03/11/2004                                 DISCHARGE SUMMARY   ADMISSION DIAGNOSES:  1.  Right periprosthetic patella fracture.  2.  Rheumatoid arthritis.  3.  Sleep apnea.  4.  Gastroesophageal reflux disease.  5.  History of anemia.  6.  Hiatal hernia.  7.  Chronic diarrhea.  8.  Myalgias/myositis.   DISCHARGE DIAGNOSES:  1.  Right periprosthetic patella fracture status post open reduction,      internal fixation.  2.  Rheumatoid arthritis.  3.  Sleep apnea.  4.  Gastroesophageal reflux disease.  5.  History of anemia.  6.  Hiatal hernia.  7.  Chronic diarrhea.  8.  Myalgia/myositis.   PROCEDURE:  Date of surgery:  March 09, 2004.  ORIF right periprosthetic  patella fracture.   SURGEON:  Dr. Lequita Halt.   ASSISTANT:  Alexzandrew L. Perkins, P.A.C.   ANESTHESIA:  Spinal.   Minimal blood loss.   No drains.   Tourniquet time:  20 minutes at 300 mmHg.   BRIEF HISTORY AND PHYSICAL:  [name] is a 70 year old female with severe  rheumatoid arthritis who had a right total knee arthroplasty about a year  and a half ago.  She has done fantastic until a few months ago when she  sustained an injury to her knee.  Fusion on radiographs showed a  nondisplaced periprosthetic patella fracture treated with immobilization.  However, the fragments started to separate.  She has a 15-degree extension  lag and now is admitted for surgery.   LABORATORY DATA:  H&H on admission 12.3 and 37.3.  BMET within normal  limits.  Blood group type B negative.  EKG dated March 06, 2004:  Normal  sinus rhythm, left atrial enlargement, minimal voltage criteria for LVH,  borderline EKG, poor R-wave  progression.  No significant change since last  tracing on September 27, 2001 confirmed by Dr. Midway Bing.   HOSPITAL COURSE:  The patient was admitted to [facility], taken to OR, and  underwent above procedure.  Later was transferred to the floor for  postoperative care, started on p.o. and IV analgesics for pain control.  Started back on her home meds.  She was on high-dose MS Contin and MSIR, and  started back on those.  She was doing fairly well, but did have some pain on  day 1.  Started up with physical therapy.  We allowed her to be  weightbearing as tolerated in the knee immobilizer.  By day 2, she was much  more comfortable, had good pain control.  Her MS Contin and MSIR were  refilled.  She was also placed on Robaxin.  She was doing better by day 2,  and was discharged home in stable condition.   PLAN:  1.  Discharged home on March 11, 2004  2.  Discharge diagnoss , please see above  3.  Discharge meds of MS Contin, MSIR and Robaxin.   DIET:  As tolerated.   ACTIVITY:  She is weightbearing as tolerated.  Knee immobilizer at all  times.  No motion or bending to the knee.  Home health PT, home health  nursing.   FOLLOWUP:  Follow up 2 weeks from surgery.   DISPOSITION:  Home.   CONDITION ON DISCHARGE:  Improved.      ALP/MEDQ  D:  05/02/2004  T:  05/02/2004  Job:  [id number]   cc:   Ollen Gross, M.D.  [facility]  [address]  [address]  [place]  [place] [address]  Fax: [phone]

## 2010-07-31 NOTE — Op Note (Signed)
NAME:  [name], [name]                             ACCOUNT NO.:  [id number]   MEDICAL RECORD NO.:  [id number]                   PATIENT TYPE:  AMB   LOCATION:  DSC                                  FACILITY:  [facility]   PHYSICIAN:  Leonides Grills, M.D.                  DATE OF BIRTH:  [birth year]   DATE OF PROCEDURE:  09/28/2003  DATE OF DISCHARGE:                                 OPERATIVE REPORT   PREOPERATIVE DIAGNOSES:  1. Bilateral bunionettes.  2. Right hallux rigidus.  3. Right flexor hallucis longus contracture.   POSTOPERATIVE DIAGNOSES:  1. Bilateral bunionettes.  2. Right hallux rigidus.  3. Right flexor hallucis longus contracture.   OPERATION:  1. Bilateral partial excision, fifth metatarsal head.  2. Right great toe cheilectomy.  3. Right flexor hallucis longus tenotomy.   ANESTHESIA:  General endotracheal  tube.   SURGEON:  Leonides Grills, M.D.   ASSISTANT:  Lianne Cure, P.A.   ESTIMATED BLOOD LOSS:  Minimal.   TOURNIQUET TIME:  Tourniquet time on the right was approximately 20 minutes.   COMPLICATIONS:  None.   DISPOSITION:  Stable to the PAR.   INDICATIONS:  This is a 70 year old female who has longstanding rheumatoid  arthritis with pain in the above areas.  She also has multiple other areas  of concern, but these were the areas that hurt her most at this point.  She  was consented for the above procedure.  All risks, which include infection,  nerve or vessel injury, persistent pain, worsening pain, stiffness,  arthritis, possible fusion of great toe, possible future surgery, were all  explained, questions were encouraged and answered.   OPERATION:  Patient brought to the operating room and placed in the supine  position after adequate general endotracheal  tube administered as well as  Ancef 1 g IV piggyback.  Bilateral lower extremities were then prepped and  draped in a sterile manner over a proximally-placed thigh tourniquet.  The  right side limb was  gravity-exsanguinated and the tourniquet was elevated to  290 mmHg.  A longitudinal incision midline over the medial aspect of the  right great toe MTP joint was then made.  Dissection was carried down  through skin and hemostasis was obtained.  Neurovascular structures were  identified both superiorly and inferiorly and protected throughout the case.  A longitudinal capsulotomy was then made.  Soft tissue was dissected  dorsally to reveal the hallus metatarsal head dorsally.  With a sagittal saw  the dorsal aspect of the head was removed.  Bone wax was then placed over  exposed bone surfaces after edges were rongeured nice and smooth.  Range of  motion of the toe improved approximately 15-20 degrees.  There was still a  contracture plantarly.  A small incision was then made over the IP joint  crease.  Dissection was carried down through skin with a  nick and spread  technique type to the FHL tendon.  The FHL tendon was then released.  This  improved the range of motion about 10-15 degrees, but it was not as drastic  as one would think, but it was definitely significantly improved.  The wound  was copiously irrigated with normal saline as well as the joint capsule.  It  was closed with 3-0 Vicryl.  The skin was closed with 4-0 nylon over both  wounds.  A longitudinal incision was then made over the dorsal lateral  aspect of the right fifth metatarsal head.  Dissection was carried down to  capsule.  The capsule was opened in line with the incision.  Soft tissue was  then elevated over the entire bony prominence and with the sagittal saw in  the sagittal plane, the lateral portion of the head was then removed.  The  edges were rounded off with a rongeur.  The area was copiously irrigated  with normal saline.  Skin was closed with 4-0 nylon suture.  We then made a  longitudinal incision over the left fifth metatarsal head similar to the  right, and the same exact procedure was performed.  Wounds  were closed with  4-0 nylon sutures.  The tourniquet was deflated on the right as well prior  to closure of the wounds, and there was no active bleeding.  Hemostasis was  obtained.  The wounds were copiously irrigated with normal saline.  The  wounds were closed with 4-0 nylon, a sterile dressing was applied.  A hard-  soled shoe was applied.  The patient was stable to the PAR.                                               Leonides Grills, M.D.    PB/MEDQ  D:  09/28/2003  T:  09/28/2003  Job:  [id number]

## 2010-09-13 ENCOUNTER — Encounter (HOSPITAL_COMMUNITY): Payer: Medicare Other | Attending: Rheumatology

## 2010-09-13 DIAGNOSIS — M069 Rheumatoid arthritis, unspecified: Secondary | ICD-10-CM | POA: Insufficient documentation

## 2010-09-28 ENCOUNTER — Encounter (HOSPITAL_COMMUNITY): Payer: Medicare Other

## 2010-12-03 ENCOUNTER — Other Ambulatory Visit: Payer: Self-pay | Admitting: Internal Medicine

## 2010-12-03 DIAGNOSIS — Z1231 Encounter for screening mammogram for malignant neoplasm of breast: Secondary | ICD-10-CM

## 2010-12-04 LAB — BASIC METABOLIC PANEL
CO2: 24
Calcium: 8.8
Glucose, Bld: 130 — ABNORMAL HIGH
Sodium: 139

## 2010-12-04 LAB — POCT CARDIAC MARKERS
Operator id: 5451
Troponin i, poc: <0.05

## 2010-12-04 LAB — URINE CULTURE

## 2010-12-04 LAB — CBC
HCT: 40.3
Hemoglobin: 13.2
MCHC: 32.7
RDW: 15.4

## 2010-12-04 LAB — URINALYSIS, ROUTINE W REFLEX MICROSCOPIC
Glucose, UA: NEGATIVE
Hgb urine dipstick: NEGATIVE
Specific Gravity, Urine: >1.046 — ABNORMAL HIGH
Urobilinogen, UA: 0.2

## 2010-12-04 LAB — DIFFERENTIAL
Basophils Absolute: 0
Basophils Relative: 0
Eosinophils Absolute: 0.5
Eosinophils Relative: 4
Monocytes Absolute: 0.8
Neutro Abs: 9 — ABNORMAL HIGH

## 2010-12-04 LAB — HEPATIC FUNCTION PANEL
Bilirubin, Direct: 0.1
Indirect Bilirubin: 0.8
Total Bilirubin: 0.9

## 2010-12-04 LAB — CK TOTAL AND CKMB (NOT AT ARMC)
CK, MB: 2
Relative Index: INVALID

## 2010-12-04 LAB — LIPASE, BLOOD: Lipase: 31

## 2010-12-04 LAB — MAGNESIUM: Magnesium: 1.8

## 2010-12-05 ENCOUNTER — Ambulatory Visit
Admission: RE | Admit: 2010-12-05 | Discharge: 2010-12-05 | Disposition: A | Discharge status: Home / Self Care | Payer: Medicare Other | Source: Ambulatory Visit | Attending: Internal Medicine | Admitting: Internal Medicine

## 2010-12-05 DIAGNOSIS — Z1231 Encounter for screening mammogram for malignant neoplasm of breast: Secondary | ICD-10-CM

## 2010-12-07 ENCOUNTER — Ambulatory Visit: Payer: Self-pay

## 2010-12-14 LAB — BASIC METABOLIC PANEL
BUN: 11
Chloride: 107
Creatinine, Ser: 0.89
Glucose, Bld: 91

## 2010-12-14 LAB — POCT HEMOGLOBIN-HEMACUE: Operator id: 128471

## 2011-01-18 ENCOUNTER — Other Ambulatory Visit: Payer: Self-pay | Admitting: Gastroenterology

## 2011-02-11 ENCOUNTER — Other Ambulatory Visit: Payer: Self-pay | Admitting: Rheumatology

## 2011-02-15 ENCOUNTER — Other Ambulatory Visit: Payer: Self-pay | Admitting: Rheumatology

## 2011-02-19 ENCOUNTER — Inpatient Hospital Stay (HOSPITAL_COMMUNITY): Admission: RE | Admit: 2011-02-19 | Payer: Medicare Other | Source: Ambulatory Visit

## 2011-03-07 ENCOUNTER — Encounter (HOSPITAL_COMMUNITY)
Admission: RE | Admit: 2011-03-07 | Discharge: 2011-03-07 | Disposition: A | Discharge status: Home / Self Care | Payer: Medicare Other | Source: Ambulatory Visit | Attending: Rheumatology | Admitting: Rheumatology

## 2011-03-07 DIAGNOSIS — M810 Age-related osteoporosis without current pathological fracture: Secondary | ICD-10-CM | POA: Insufficient documentation

## 2011-03-07 DIAGNOSIS — M069 Rheumatoid arthritis, unspecified: Secondary | ICD-10-CM | POA: Insufficient documentation

## 2011-03-07 MED ORDER — SODIUM CHLORIDE 0.9 % IV SOLN
1000.0000 mg | INTRAVENOUS | Status: DC
  Administered 2011-03-07: 1000 mg via INTRAVENOUS
  Filled 2011-03-07: qty 100

## 2011-03-07 MED ORDER — METHYLPREDNISOLONE SODIUM SUCC 125 MG IJ SOLR
100.0000 mg | INTRAMUSCULAR | Status: DC
  Administered 2011-03-07: 100 mg via INTRAVENOUS

## 2011-03-07 MED ORDER — ACETAMINOPHEN 325 MG PO TABS
ORAL_TABLET | ORAL | Status: AC
  Administered 2011-03-07: 650 mg via ORAL
  Filled 2011-03-07: qty 2

## 2011-03-07 MED ORDER — ACETAMINOPHEN 325 MG PO TABS
650.0000 mg | ORAL_TABLET | ORAL | Status: DC
  Administered 2011-03-07: 650 mg via ORAL

## 2011-03-07 MED ORDER — METHYLPREDNISOLONE SODIUM SUCC 125 MG IJ SOLR
INTRAMUSCULAR | Status: AC
  Administered 2011-03-07: 100 mg via INTRAVENOUS
  Filled 2011-03-07: qty 2

## 2011-03-21 ENCOUNTER — Encounter (HOSPITAL_COMMUNITY)
Admission: RE | Admit: 2011-03-21 | Discharge: 2011-03-21 | Disposition: A | Discharge status: Home / Self Care | Payer: Medicare Other | Source: Ambulatory Visit | Attending: Rheumatology | Admitting: Rheumatology

## 2011-03-21 DIAGNOSIS — M069 Rheumatoid arthritis, unspecified: Secondary | ICD-10-CM | POA: Insufficient documentation

## 2011-03-21 DIAGNOSIS — M810 Age-related osteoporosis without current pathological fracture: Secondary | ICD-10-CM | POA: Insufficient documentation

## 2011-03-21 MED ORDER — METHYLPREDNISOLONE SODIUM SUCC 125 MG IJ SOLR
100.0000 mg | INTRAMUSCULAR | Status: AC
  Administered 2011-03-21: 100 mg via INTRAVENOUS
  Filled 2011-03-21: qty 2

## 2011-03-21 MED ORDER — ACETAMINOPHEN 325 MG PO TABS
650.0000 mg | ORAL_TABLET | ORAL | Status: AC
  Administered 2011-03-21: 650 mg via ORAL
  Filled 2011-03-21: qty 2

## 2011-03-21 MED ORDER — RITUXIMAB CHEMO INJECTION 10 MG/ML
1000.0000 mg | INTRAVENOUS | Status: AC
  Administered 2011-03-21: 1000 mg via INTRAVENOUS
  Filled 2011-03-21: qty 100

## 2011-07-23 DIAGNOSIS — M069 Rheumatoid arthritis, unspecified: Secondary | ICD-10-CM | POA: Diagnosis not present

## 2011-07-23 DIAGNOSIS — E785 Hyperlipidemia, unspecified: Secondary | ICD-10-CM | POA: Diagnosis not present

## 2011-07-23 DIAGNOSIS — M79609 Pain in unspecified limb: Secondary | ICD-10-CM | POA: Diagnosis not present

## 2011-07-30 DIAGNOSIS — M9981 Other biomechanical lesions of cervical region: Secondary | ICD-10-CM | POA: Diagnosis not present

## 2011-07-30 DIAGNOSIS — M531 Cervicobrachial syndrome: Secondary | ICD-10-CM | POA: Diagnosis not present

## 2011-07-30 DIAGNOSIS — M5126 Other intervertebral disc displacement, lumbar region: Secondary | ICD-10-CM | POA: Diagnosis not present

## 2011-07-30 DIAGNOSIS — M999 Biomechanical lesion, unspecified: Secondary | ICD-10-CM | POA: Diagnosis not present

## 2011-08-02 ENCOUNTER — Encounter: Payer: Self-pay | Admitting: *Deleted

## 2011-08-02 DIAGNOSIS — M9981 Other biomechanical lesions of cervical region: Secondary | ICD-10-CM | POA: Diagnosis not present

## 2011-08-02 DIAGNOSIS — M531 Cervicobrachial syndrome: Secondary | ICD-10-CM | POA: Diagnosis not present

## 2011-08-02 DIAGNOSIS — M5126 Other intervertebral disc displacement, lumbar region: Secondary | ICD-10-CM | POA: Diagnosis not present

## 2011-08-02 DIAGNOSIS — M999 Biomechanical lesion, unspecified: Secondary | ICD-10-CM | POA: Diagnosis not present

## 2011-08-06 ENCOUNTER — Other Ambulatory Visit (HOSPITAL_COMMUNITY): Payer: Self-pay | Admitting: *Deleted

## 2011-08-08 ENCOUNTER — Encounter (HOSPITAL_COMMUNITY)
Admission: RE | Admit: 2011-08-08 | Discharge: 2011-08-08 | Disposition: A | Discharge status: Home / Self Care | Payer: Medicare Other | Source: Ambulatory Visit | Attending: Rheumatology | Admitting: Rheumatology

## 2011-08-08 DIAGNOSIS — M069 Rheumatoid arthritis, unspecified: Secondary | ICD-10-CM | POA: Diagnosis not present

## 2011-08-08 DIAGNOSIS — M810 Age-related osteoporosis without current pathological fracture: Secondary | ICD-10-CM | POA: Insufficient documentation

## 2011-08-08 MED ORDER — SODIUM CHLORIDE 0.9 % IV SOLN
INTRAVENOUS | Status: DC
  Administered 2011-08-08: 09:00:00 via INTRAVENOUS

## 2011-08-08 MED ORDER — SODIUM CHLORIDE 0.9 % IV SOLN
1000.0000 mg | INTRAVENOUS | Status: DC
  Administered 2011-08-08: 1000 mg via INTRAVENOUS
  Filled 2011-08-08: qty 100

## 2011-08-08 MED ORDER — METHYLPREDNISOLONE SODIUM SUCC 125 MG IJ SOLR
100.0000 mg | INTRAMUSCULAR | Status: DC
  Administered 2011-08-08: 100 mg via INTRAVENOUS
  Filled 2011-08-08: qty 2

## 2011-08-08 MED ORDER — DIPHENHYDRAMINE HCL 50 MG/ML IJ SOLN
25.0000 mg | INTRAMUSCULAR | Status: DC
  Administered 2011-08-08: 25 mg via INTRAVENOUS
  Filled 2011-08-08: qty 1

## 2011-08-20 ENCOUNTER — Other Ambulatory Visit (HOSPITAL_COMMUNITY): Payer: Self-pay | Admitting: *Deleted

## 2011-08-22 ENCOUNTER — Encounter (HOSPITAL_COMMUNITY)
Admission: RE | Admit: 2011-08-22 | Discharge: 2011-08-22 | Disposition: A | Discharge status: Home / Self Care | Payer: Medicare Other | Source: Ambulatory Visit | Attending: Rheumatology | Admitting: Rheumatology

## 2011-08-22 DIAGNOSIS — M069 Rheumatoid arthritis, unspecified: Secondary | ICD-10-CM | POA: Diagnosis not present

## 2011-08-22 DIAGNOSIS — M810 Age-related osteoporosis without current pathological fracture: Secondary | ICD-10-CM | POA: Insufficient documentation

## 2011-08-22 MED ORDER — SODIUM CHLORIDE 0.9 % IV SOLN
INTRAVENOUS | Status: AC
  Administered 2011-08-22: 10:00:00 via INTRAVENOUS

## 2011-08-22 MED ORDER — DIPHENHYDRAMINE HCL 50 MG/ML IJ SOLN
25.0000 mg | INTRAMUSCULAR | Status: AC
  Administered 2011-08-22: 25 mg via INTRAVENOUS
  Filled 2011-08-22: qty 1

## 2011-08-22 MED ORDER — LORATADINE 10 MG PO TABS
10.0000 mg | ORAL_TABLET | Freq: Once | ORAL | Status: DC
  Filled 2011-08-22: qty 1

## 2011-08-22 MED ORDER — METHYLPREDNISOLONE SODIUM SUCC 125 MG IJ SOLR
100.0000 mg | INTRAMUSCULAR | Status: AC
  Administered 2011-08-22: 100 mg via INTRAVENOUS
  Filled 2011-08-22: qty 2

## 2011-08-22 MED ORDER — SODIUM CHLORIDE 0.9 % IV SOLN
1000.0000 mg | INTRAVENOUS | Status: AC
  Administered 2011-08-22: 1000 mg via INTRAVENOUS
  Filled 2011-08-22: qty 100

## 2011-08-27 DIAGNOSIS — M069 Rheumatoid arthritis, unspecified: Secondary | ICD-10-CM | POA: Diagnosis not present

## 2011-08-27 DIAGNOSIS — M79609 Pain in unspecified limb: Secondary | ICD-10-CM | POA: Diagnosis not present

## 2011-09-09 DIAGNOSIS — L981 Factitial dermatitis: Secondary | ICD-10-CM | POA: Diagnosis not present

## 2011-09-09 DIAGNOSIS — L259 Unspecified contact dermatitis, unspecified cause: Secondary | ICD-10-CM | POA: Diagnosis not present

## 2011-10-07 DIAGNOSIS — E782 Mixed hyperlipidemia: Secondary | ICD-10-CM | POA: Diagnosis not present

## 2011-10-07 DIAGNOSIS — Z79899 Other long term (current) drug therapy: Secondary | ICD-10-CM | POA: Diagnosis not present

## 2011-11-14 ENCOUNTER — Ambulatory Visit
Admission: RE | Admit: 2011-11-14 | Discharge: 2011-11-14 | Disposition: A | Discharge status: Home / Self Care | Payer: Medicare Other | Source: Ambulatory Visit | Attending: Cardiology | Admitting: Cardiology

## 2011-11-14 ENCOUNTER — Other Ambulatory Visit: Payer: Self-pay | Admitting: Cardiology

## 2011-11-14 DIAGNOSIS — R0602 Shortness of breath: Secondary | ICD-10-CM

## 2011-11-18 DIAGNOSIS — M79609 Pain in unspecified limb: Secondary | ICD-10-CM | POA: Diagnosis not present

## 2011-11-18 DIAGNOSIS — B351 Tinea unguium: Secondary | ICD-10-CM | POA: Diagnosis not present

## 2011-11-25 DIAGNOSIS — Z23 Encounter for immunization: Secondary | ICD-10-CM | POA: Diagnosis not present

## 2011-11-25 DIAGNOSIS — M069 Rheumatoid arthritis, unspecified: Secondary | ICD-10-CM | POA: Diagnosis not present

## 2011-11-25 DIAGNOSIS — M810 Age-related osteoporosis without current pathological fracture: Secondary | ICD-10-CM | POA: Diagnosis not present

## 2011-12-20 DIAGNOSIS — F4323 Adjustment disorder with mixed anxiety and depressed mood: Secondary | ICD-10-CM | POA: Diagnosis not present

## 2011-12-23 ENCOUNTER — Other Ambulatory Visit: Payer: Self-pay | Admitting: Internal Medicine

## 2011-12-23 DIAGNOSIS — Z1231 Encounter for screening mammogram for malignant neoplasm of breast: Secondary | ICD-10-CM

## 2012-01-13 ENCOUNTER — Ambulatory Visit
Admission: RE | Admit: 2012-01-13 | Discharge: 2012-01-13 | Disposition: A | Discharge status: Home / Self Care | Payer: Medicare Other | Source: Ambulatory Visit | Attending: Internal Medicine | Admitting: Internal Medicine

## 2012-01-13 DIAGNOSIS — Z1231 Encounter for screening mammogram for malignant neoplasm of breast: Secondary | ICD-10-CM

## 2012-01-16 ENCOUNTER — Other Ambulatory Visit: Payer: Self-pay | Admitting: Internal Medicine

## 2012-01-16 DIAGNOSIS — R928 Other abnormal and inconclusive findings on diagnostic imaging of breast: Secondary | ICD-10-CM

## 2012-01-23 ENCOUNTER — Ambulatory Visit
Admission: RE | Admit: 2012-01-23 | Discharge: 2012-01-23 | Disposition: A | Discharge status: Home / Self Care | Payer: Medicare Other | Source: Ambulatory Visit | Attending: Internal Medicine | Admitting: Internal Medicine

## 2012-01-23 DIAGNOSIS — R928 Other abnormal and inconclusive findings on diagnostic imaging of breast: Secondary | ICD-10-CM

## 2012-01-23 DIAGNOSIS — H6120 Impacted cerumen, unspecified ear: Secondary | ICD-10-CM | POA: Diagnosis not present

## 2012-01-23 DIAGNOSIS — N63 Unspecified lump in unspecified breast: Secondary | ICD-10-CM | POA: Diagnosis not present

## 2012-01-23 DIAGNOSIS — H9319 Tinnitus, unspecified ear: Secondary | ICD-10-CM | POA: Diagnosis not present

## 2012-01-23 DIAGNOSIS — J45909 Unspecified asthma, uncomplicated: Secondary | ICD-10-CM | POA: Diagnosis not present

## 2012-01-23 DIAGNOSIS — H6690 Otitis media, unspecified, unspecified ear: Secondary | ICD-10-CM | POA: Diagnosis not present

## 2012-01-30 DIAGNOSIS — M069 Rheumatoid arthritis, unspecified: Secondary | ICD-10-CM | POA: Diagnosis not present

## 2012-01-30 DIAGNOSIS — M810 Age-related osteoporosis without current pathological fracture: Secondary | ICD-10-CM | POA: Diagnosis not present

## 2012-01-30 DIAGNOSIS — M25539 Pain in unspecified wrist: Secondary | ICD-10-CM | POA: Diagnosis not present

## 2012-02-19 DIAGNOSIS — B351 Tinea unguium: Secondary | ICD-10-CM | POA: Diagnosis not present

## 2012-02-19 DIAGNOSIS — M79609 Pain in unspecified limb: Secondary | ICD-10-CM | POA: Diagnosis not present

## 2012-02-21 ENCOUNTER — Other Ambulatory Visit (HOSPITAL_COMMUNITY): Payer: Self-pay | Admitting: *Deleted

## 2012-02-24 ENCOUNTER — Inpatient Hospital Stay (HOSPITAL_COMMUNITY): Admission: RE | Admit: 2012-02-24 | Payer: Medicare Other | Source: Ambulatory Visit

## 2012-03-06 ENCOUNTER — Encounter (HOSPITAL_COMMUNITY): Payer: Medicare Other

## 2012-03-09 ENCOUNTER — Encounter (HOSPITAL_COMMUNITY): Payer: Medicare Other

## 2012-03-24 DIAGNOSIS — F4323 Adjustment disorder with mixed anxiety and depressed mood: Secondary | ICD-10-CM | POA: Diagnosis not present

## 2012-03-26 DIAGNOSIS — D485 Neoplasm of uncertain behavior of skin: Secondary | ICD-10-CM | POA: Diagnosis not present

## 2012-03-26 DIAGNOSIS — L821 Other seborrheic keratosis: Secondary | ICD-10-CM | POA: Diagnosis not present

## 2012-03-31 DIAGNOSIS — M810 Age-related osteoporosis without current pathological fracture: Secondary | ICD-10-CM | POA: Diagnosis not present

## 2012-03-31 DIAGNOSIS — M069 Rheumatoid arthritis, unspecified: Secondary | ICD-10-CM | POA: Diagnosis not present

## 2012-03-31 DIAGNOSIS — N318 Other neuromuscular dysfunction of bladder: Secondary | ICD-10-CM | POA: Diagnosis not present

## 2012-03-31 DIAGNOSIS — E785 Hyperlipidemia, unspecified: Secondary | ICD-10-CM | POA: Diagnosis not present

## 2012-03-31 DIAGNOSIS — L820 Inflamed seborrheic keratosis: Secondary | ICD-10-CM | POA: Diagnosis not present

## 2012-03-31 DIAGNOSIS — R7301 Impaired fasting glucose: Secondary | ICD-10-CM | POA: Diagnosis not present

## 2012-03-31 DIAGNOSIS — L988 Other specified disorders of the skin and subcutaneous tissue: Secondary | ICD-10-CM | POA: Diagnosis not present

## 2012-04-16 DIAGNOSIS — B081 Molluscum contagiosum: Secondary | ICD-10-CM | POA: Diagnosis not present

## 2012-05-01 ENCOUNTER — Encounter: Payer: Self-pay | Admitting: *Deleted

## 2012-05-05 DIAGNOSIS — F4323 Adjustment disorder with mixed anxiety and depressed mood: Secondary | ICD-10-CM | POA: Diagnosis not present

## 2012-05-19 DIAGNOSIS — F4323 Adjustment disorder with mixed anxiety and depressed mood: Secondary | ICD-10-CM | POA: Diagnosis not present

## 2012-05-21 DIAGNOSIS — M069 Rheumatoid arthritis, unspecified: Secondary | ICD-10-CM | POA: Diagnosis not present

## 2012-05-21 DIAGNOSIS — R159 Full incontinence of feces: Secondary | ICD-10-CM | POA: Diagnosis not present

## 2012-05-21 DIAGNOSIS — Z23 Encounter for immunization: Secondary | ICD-10-CM | POA: Diagnosis not present

## 2012-05-21 DIAGNOSIS — N952 Postmenopausal atrophic vaginitis: Secondary | ICD-10-CM | POA: Diagnosis not present

## 2012-05-21 DIAGNOSIS — Z1331 Encounter for screening for depression: Secondary | ICD-10-CM | POA: Diagnosis not present

## 2012-05-21 DIAGNOSIS — IMO0002 Reserved for concepts with insufficient information to code with codable children: Secondary | ICD-10-CM | POA: Diagnosis not present

## 2012-05-21 DIAGNOSIS — J449 Chronic obstructive pulmonary disease, unspecified: Secondary | ICD-10-CM | POA: Diagnosis not present

## 2012-05-21 DIAGNOSIS — F411 Generalized anxiety disorder: Secondary | ICD-10-CM | POA: Diagnosis not present

## 2012-05-21 DIAGNOSIS — Z0000 Encounter for general adult medical examination without abnormal findings: Secondary | ICD-10-CM | POA: Diagnosis not present

## 2012-05-21 DIAGNOSIS — E119 Type 2 diabetes mellitus without complications: Secondary | ICD-10-CM | POA: Diagnosis not present

## 2012-05-27 DIAGNOSIS — M069 Rheumatoid arthritis, unspecified: Secondary | ICD-10-CM | POA: Diagnosis not present

## 2012-05-27 DIAGNOSIS — M810 Age-related osteoporosis without current pathological fracture: Secondary | ICD-10-CM | POA: Diagnosis not present

## 2012-05-29 DIAGNOSIS — E1159 Type 2 diabetes mellitus with other circulatory complications: Secondary | ICD-10-CM | POA: Diagnosis not present

## 2012-05-29 DIAGNOSIS — L608 Other nail disorders: Secondary | ICD-10-CM | POA: Diagnosis not present

## 2012-05-29 DIAGNOSIS — Q828 Other specified congenital malformations of skin: Secondary | ICD-10-CM | POA: Diagnosis not present

## 2012-06-10 ENCOUNTER — Other Ambulatory Visit (HOSPITAL_COMMUNITY): Payer: Self-pay | Admitting: *Deleted

## 2012-06-11 ENCOUNTER — Encounter (HOSPITAL_COMMUNITY)
Admission: RE | Admit: 2012-06-11 | Discharge: 2012-06-11 | Disposition: A | Discharge status: Home / Self Care | Payer: Medicare Other | Source: Ambulatory Visit | Attending: Rheumatology | Admitting: Rheumatology

## 2012-06-11 DIAGNOSIS — M069 Rheumatoid arthritis, unspecified: Secondary | ICD-10-CM | POA: Diagnosis not present

## 2012-06-11 MED ORDER — DIPHENHYDRAMINE HCL 50 MG/ML IJ SOLN
25.0000 mg | Freq: Once | INTRAMUSCULAR | Status: AC
  Administered 2012-06-11: 25 mg via INTRAVENOUS

## 2012-06-11 MED ORDER — SODIUM CHLORIDE 0.9 % IV SOLN
INTRAVENOUS | Status: DC
  Administered 2012-06-11: 09:00:00 via INTRAVENOUS

## 2012-06-11 MED ORDER — METHYLPREDNISOLONE SODIUM SUCC 125 MG IJ SOLR
100.0000 mg | Freq: Once | INTRAMUSCULAR | Status: AC
  Administered 2012-06-11: 100 mg via INTRAVENOUS

## 2012-06-11 MED ORDER — SODIUM CHLORIDE 0.9 % IV SOLN
1000.0000 mg | Freq: Once | INTRAVENOUS | Status: AC
  Administered 2012-06-11: 1000 mg via INTRAVENOUS
  Filled 2012-06-11: qty 100

## 2012-06-11 MED ORDER — METHYLPREDNISOLONE SODIUM SUCC 125 MG IJ SOLR
INTRAMUSCULAR | Status: AC
  Filled 2012-06-11: qty 2

## 2012-06-11 MED ORDER — DIPHENHYDRAMINE HCL 50 MG/ML IJ SOLN
INTRAMUSCULAR | Status: AC
  Filled 2012-06-11: qty 1

## 2012-06-15 ENCOUNTER — Other Ambulatory Visit: Payer: Self-pay | Admitting: Internal Medicine

## 2012-06-15 DIAGNOSIS — N6002 Solitary cyst of left breast: Secondary | ICD-10-CM

## 2012-06-16 DIAGNOSIS — F4323 Adjustment disorder with mixed anxiety and depressed mood: Secondary | ICD-10-CM | POA: Diagnosis not present

## 2012-06-25 ENCOUNTER — Encounter (HOSPITAL_COMMUNITY)
Admission: RE | Admit: 2012-06-25 | Discharge: 2012-06-25 | Disposition: A | Discharge status: Home / Self Care | Payer: Medicare Other | Source: Ambulatory Visit | Attending: Rheumatology | Admitting: Rheumatology

## 2012-06-25 DIAGNOSIS — M069 Rheumatoid arthritis, unspecified: Secondary | ICD-10-CM | POA: Diagnosis not present

## 2012-06-25 MED ORDER — METHYLPREDNISOLONE SODIUM SUCC 125 MG IJ SOLR: 100.0000 mg | Freq: Once | INTRAMUSCULAR | Status: AC

## 2012-06-25 MED ORDER — SODIUM CHLORIDE 0.9 % IV SOLN
1000.0000 mg | Freq: Once | INTRAVENOUS | Status: AC
  Administered 2012-06-25: 1000 mg via INTRAVENOUS
  Filled 2012-06-25: qty 100

## 2012-06-25 MED ORDER — DIPHENHYDRAMINE HCL 50 MG/ML IJ SOLN
INTRAMUSCULAR | Status: AC
  Administered 2012-06-25: 25 mg via INTRAVENOUS
  Filled 2012-06-25: qty 1

## 2012-06-25 MED ORDER — SODIUM CHLORIDE 0.9 % IV SOLN
INTRAVENOUS | Status: DC
  Administered 2012-06-25: 1000 mL via INTRAVENOUS

## 2012-06-25 MED ORDER — DIPHENHYDRAMINE HCL 50 MG/ML IJ SOLN: 25.0000 mg | Freq: Once | INTRAMUSCULAR | Status: AC

## 2012-06-25 MED ORDER — METHYLPREDNISOLONE SODIUM SUCC 125 MG IJ SOLR
INTRAMUSCULAR | Status: AC
  Administered 2012-06-25: 100 mg via INTRAVENOUS
  Filled 2012-06-25: qty 2

## 2012-07-06 DIAGNOSIS — E119 Type 2 diabetes mellitus without complications: Secondary | ICD-10-CM | POA: Diagnosis not present

## 2012-07-06 DIAGNOSIS — Z713 Dietary counseling and surveillance: Secondary | ICD-10-CM | POA: Diagnosis not present

## 2012-07-13 ENCOUNTER — Other Ambulatory Visit: Payer: Medicare Other

## 2012-07-16 DIAGNOSIS — M7660 Achilles tendinitis, unspecified leg: Secondary | ICD-10-CM | POA: Diagnosis not present

## 2012-07-24 ENCOUNTER — Ambulatory Visit
Admission: RE | Admit: 2012-07-24 | Discharge: 2012-07-24 | Disposition: A | Discharge status: Home / Self Care | Payer: Medicare Other | Source: Ambulatory Visit | Attending: Internal Medicine | Admitting: Internal Medicine

## 2012-07-24 DIAGNOSIS — N6002 Solitary cyst of left breast: Secondary | ICD-10-CM

## 2012-07-24 DIAGNOSIS — N6009 Solitary cyst of unspecified breast: Secondary | ICD-10-CM | POA: Diagnosis not present

## 2012-08-27 DIAGNOSIS — M47812 Spondylosis without myelopathy or radiculopathy, cervical region: Secondary | ICD-10-CM | POA: Diagnosis not present

## 2012-08-27 DIAGNOSIS — M479 Spondylosis, unspecified: Secondary | ICD-10-CM | POA: Diagnosis not present

## 2012-08-28 DIAGNOSIS — L820 Inflamed seborrheic keratosis: Secondary | ICD-10-CM | POA: Diagnosis not present

## 2012-08-28 DIAGNOSIS — L988 Other specified disorders of the skin and subcutaneous tissue: Secondary | ICD-10-CM | POA: Diagnosis not present

## 2012-09-07 DIAGNOSIS — M79609 Pain in unspecified limb: Secondary | ICD-10-CM | POA: Diagnosis not present

## 2012-09-07 DIAGNOSIS — E1159 Type 2 diabetes mellitus with other circulatory complications: Secondary | ICD-10-CM | POA: Diagnosis not present

## 2012-09-07 DIAGNOSIS — B351 Tinea unguium: Secondary | ICD-10-CM | POA: Diagnosis not present

## 2012-09-22 DIAGNOSIS — M5137 Other intervertebral disc degeneration, lumbosacral region: Secondary | ICD-10-CM | POA: Diagnosis not present

## 2012-09-22 DIAGNOSIS — M810 Age-related osteoporosis without current pathological fracture: Secondary | ICD-10-CM | POA: Diagnosis not present

## 2012-09-22 DIAGNOSIS — M069 Rheumatoid arthritis, unspecified: Secondary | ICD-10-CM | POA: Diagnosis not present

## 2012-09-29 DIAGNOSIS — M810 Age-related osteoporosis without current pathological fracture: Secondary | ICD-10-CM | POA: Diagnosis not present

## 2012-09-29 DIAGNOSIS — IMO0002 Reserved for concepts with insufficient information to code with codable children: Secondary | ICD-10-CM | POA: Diagnosis not present

## 2012-09-29 DIAGNOSIS — E119 Type 2 diabetes mellitus without complications: Secondary | ICD-10-CM | POA: Diagnosis not present

## 2012-09-29 DIAGNOSIS — M069 Rheumatoid arthritis, unspecified: Secondary | ICD-10-CM | POA: Diagnosis not present

## 2012-10-29 DIAGNOSIS — L981 Factitial dermatitis: Secondary | ICD-10-CM | POA: Diagnosis not present

## 2012-10-29 DIAGNOSIS — L259 Unspecified contact dermatitis, unspecified cause: Secondary | ICD-10-CM | POA: Diagnosis not present

## 2012-11-13 DIAGNOSIS — M810 Age-related osteoporosis without current pathological fracture: Secondary | ICD-10-CM | POA: Diagnosis not present

## 2012-11-13 DIAGNOSIS — M069 Rheumatoid arthritis, unspecified: Secondary | ICD-10-CM | POA: Diagnosis not present

## 2012-11-13 DIAGNOSIS — M5137 Other intervertebral disc degeneration, lumbosacral region: Secondary | ICD-10-CM | POA: Diagnosis not present

## 2012-11-13 DIAGNOSIS — Z23 Encounter for immunization: Secondary | ICD-10-CM | POA: Diagnosis not present

## 2012-12-07 ENCOUNTER — Other Ambulatory Visit (HOSPITAL_COMMUNITY): Payer: Self-pay | Admitting: *Deleted

## 2012-12-08 DIAGNOSIS — L608 Other nail disorders: Secondary | ICD-10-CM | POA: Diagnosis not present

## 2012-12-08 DIAGNOSIS — E1159 Type 2 diabetes mellitus with other circulatory complications: Secondary | ICD-10-CM | POA: Diagnosis not present

## 2012-12-08 DIAGNOSIS — Q828 Other specified congenital malformations of skin: Secondary | ICD-10-CM | POA: Diagnosis not present

## 2012-12-09 ENCOUNTER — Encounter (HOSPITAL_COMMUNITY)
Admission: RE | Admit: 2012-12-09 | Discharge: 2012-12-09 | Disposition: A | Discharge status: Home / Self Care | Payer: Medicare Other | Source: Ambulatory Visit | Attending: Rheumatology | Admitting: Rheumatology

## 2012-12-09 DIAGNOSIS — M069 Rheumatoid arthritis, unspecified: Secondary | ICD-10-CM | POA: Insufficient documentation

## 2012-12-09 MED ORDER — METHYLPREDNISOLONE SODIUM SUCC 125 MG IJ SOLR: 100.0000 mg | Freq: Once | INTRAMUSCULAR | Status: AC

## 2012-12-09 MED ORDER — SODIUM CHLORIDE 0.9 % IV SOLN
1000.0000 mg | INTRAVENOUS | Status: DC
  Administered 2012-12-09: 1000 mg via INTRAVENOUS
  Filled 2012-12-09: qty 100

## 2012-12-09 MED ORDER — DIPHENHYDRAMINE HCL 50 MG/ML IJ SOLN: 25.0000 mg | INTRAMUSCULAR | Status: DC

## 2012-12-09 MED ORDER — METHYLPREDNISOLONE SODIUM SUCC 125 MG IJ SOLR
INTRAMUSCULAR | Status: AC
  Administered 2012-12-09: 100 mg via INTRAVENOUS
  Filled 2012-12-09: qty 2

## 2012-12-09 MED ORDER — SODIUM CHLORIDE 0.9 % IV SOLN
INTRAVENOUS | Status: DC
  Administered 2012-12-09: 250 mL via INTRAVENOUS

## 2012-12-09 MED ORDER — DIPHENHYDRAMINE HCL 50 MG/ML IJ SOLN
INTRAMUSCULAR | Status: AC
  Administered 2012-12-09: 25 mg via INTRAVENOUS
  Filled 2012-12-09: qty 1

## 2012-12-09 NOTE — Progress Notes (Signed)
Ok for patient to receive her second rituxan treatment one day early per Bea Graff, Rn/Dr. Beau Fanny RN

## 2012-12-22 ENCOUNTER — Encounter (HOSPITAL_COMMUNITY): Payer: 59

## 2013-01-06 ENCOUNTER — Encounter (HOSPITAL_COMMUNITY)
Admission: RE | Admit: 2013-01-06 | Discharge: 2013-01-06 | Disposition: A | Discharge status: Home / Self Care | Payer: Medicare Other | Source: Ambulatory Visit | Attending: Rheumatology | Admitting: Rheumatology

## 2013-01-06 DIAGNOSIS — M069 Rheumatoid arthritis, unspecified: Secondary | ICD-10-CM | POA: Diagnosis not present

## 2013-01-06 MED ORDER — SODIUM CHLORIDE 0.9 % IV SOLN
1000.0000 mg | INTRAVENOUS | Status: AC
  Administered 2013-01-06: 1000 mg via INTRAVENOUS
  Filled 2013-01-06: qty 100

## 2013-01-06 MED ORDER — METHYLPREDNISOLONE SODIUM SUCC 125 MG IJ SOLR
INTRAMUSCULAR | Status: AC
  Administered 2013-01-06: 100 mg via INTRAVENOUS
  Filled 2013-01-06: qty 2

## 2013-01-06 MED ORDER — DIPHENHYDRAMINE HCL 50 MG/ML IJ SOLN
25.0000 mg | INTRAMUSCULAR | Status: DC
  Administered 2013-01-06: 25 mg via INTRAVENOUS

## 2013-01-06 MED ORDER — METHYLPREDNISOLONE SODIUM SUCC 125 MG IJ SOLR: 100.0000 mg | Freq: Once | INTRAMUSCULAR | Status: AC

## 2013-01-06 MED ORDER — SODIUM CHLORIDE 0.9 % IV SOLN
INTRAVENOUS | Status: AC
  Administered 2013-01-06: 09:00:00 via INTRAVENOUS

## 2013-01-06 MED ORDER — DIPHENHYDRAMINE HCL 50 MG/ML IJ SOLN
INTRAMUSCULAR | Status: AC
  Filled 2013-01-06: qty 1

## 2013-01-12 ENCOUNTER — Other Ambulatory Visit: Payer: Self-pay | Admitting: Internal Medicine

## 2013-01-12 DIAGNOSIS — N6002 Solitary cyst of left breast: Secondary | ICD-10-CM

## 2013-01-29 ENCOUNTER — Other Ambulatory Visit: Payer: Medicare Other

## 2013-02-09 ENCOUNTER — Ambulatory Visit: Payer: Self-pay | Admitting: Podiatrist

## 2013-02-12 DIAGNOSIS — M2040 Other hammer toe(s) (acquired), unspecified foot: Secondary | ICD-10-CM | POA: Diagnosis not present

## 2013-02-12 DIAGNOSIS — M2020 Hallux rigidus, unspecified foot: Secondary | ICD-10-CM | POA: Diagnosis not present

## 2013-02-16 ENCOUNTER — Ambulatory Visit: Payer: Self-pay | Admitting: Podiatrist

## 2013-03-02 ENCOUNTER — Encounter: Payer: Self-pay | Admitting: Podiatrist

## 2013-03-02 ENCOUNTER — Ambulatory Visit (INDEPENDENT_AMBULATORY_CARE_PROVIDER_SITE_OTHER): Payer: Medicare Other | Admitting: Podiatrist

## 2013-03-02 VITALS — BP 127/90 | HR 90 | Resp 18

## 2013-03-02 DIAGNOSIS — M79609 Pain in unspecified limb: Secondary | ICD-10-CM | POA: Diagnosis not present

## 2013-03-02 DIAGNOSIS — B351 Tinea unguium: Secondary | ICD-10-CM | POA: Diagnosis not present

## 2013-03-02 NOTE — Progress Notes (Signed)
   Subjective: Chamaine presents today for continued foot and nail care. Aurelie is well known to me as I have seen her in the past at our [facility] location. She denies rheumatoid arthritic patient with significant deformity of bilateral feet. She also has a rheumatoid nodule on the plantar aspect of her right great toe which we debrided routinely. Her nails are elongated, and uncomfortable and she cannot trim her self due to her rheumatoid arthritis.  Objective: Significant deformity and contracture of all digits left greater than right is noted. Rheumatoid foot type is seen. Patient's toenails are elongated, thickened, ingrown and uncomfortable bilateral. Hyperkeratotic lesion sub-right hallux present. It also present navicular left which relieved alone  Assessment: Symptomatic mycotic toenails, callus right hallux  Plan: Toenails and hallux lesion debrided without complication. Patient recently saw Dr. Victorino Dike in [place] and he recommended custom shoes from [facility]. Patient would like to get the shoes that he recommended versus getting them from [place]. Also she discussed possibility of surgery with Dr. Victorino Dike for the rheumatoid foot and I encouraged her to get this done if her feet are is painful as they appear. She will try the shoes first and see how she does. I will see her back for routine care in 10 weeks

## 2013-03-12 ENCOUNTER — Ambulatory Visit
Admission: RE | Admit: 2013-03-12 | Discharge: 2013-03-12 | Disposition: A | Discharge status: Home / Self Care | Payer: Medicare Other | Source: Ambulatory Visit | Attending: Internal Medicine | Admitting: Internal Medicine

## 2013-03-12 DIAGNOSIS — N6002 Solitary cyst of left breast: Secondary | ICD-10-CM

## 2013-03-12 DIAGNOSIS — R928 Other abnormal and inconclusive findings on diagnostic imaging of breast: Secondary | ICD-10-CM | POA: Diagnosis not present

## 2013-03-16 DIAGNOSIS — H524 Presbyopia: Secondary | ICD-10-CM | POA: Diagnosis not present

## 2013-03-16 DIAGNOSIS — E119 Type 2 diabetes mellitus without complications: Secondary | ICD-10-CM | POA: Diagnosis not present

## 2013-05-11 ENCOUNTER — Ambulatory Visit (INDEPENDENT_AMBULATORY_CARE_PROVIDER_SITE_OTHER): Payer: Medicare Other | Admitting: Podiatrist

## 2013-05-11 ENCOUNTER — Encounter: Payer: Self-pay | Admitting: Podiatrist

## 2013-05-11 VITALS — BP 101/64 | HR 103 | Resp 18

## 2013-05-11 DIAGNOSIS — M79609 Pain in unspecified limb: Secondary | ICD-10-CM

## 2013-05-11 DIAGNOSIS — B351 Tinea unguium: Secondary | ICD-10-CM

## 2013-05-11 NOTE — Progress Notes (Signed)
I am here to get my toenails trimmed up  Subjective: Maripaz presents today for continued foot and nail care. Shianne is well known to me from our [place] location. She is a rheumatoid arthritic patient with significant deformity of bilateral feet. She also has a rheumatoid nodule on the plantar aspect of her right great toe which we debride routinely. Her nails are elongated, and uncomfortable and she cannot trim her self due to her rheumatoid arthritis.   Objective: Significant deformity and contracture of all digits left greater than right is noted. Rheumatoid foot type is seen. Patient's toenails are elongated, thickened, ingrown and uncomfortable bilateral. Hyperkeratotic lesion sub-right hallux present. It also present navicular left.  Vascular exam reveals palpable pulses at 1/4 DP and PT bilateral. Neurological sensation is intact bilateral.  Assessment: Symptomatic mycotic toenails, callus right hallux   Plan: Toenails and hallux lesion debrided without complication. Patient recently saw Dr. Doran Durand in [place] and he recommended custom shoes from [employer] [facility].  Khole states she hasn't been by to pick up the shoes yet and I encouraged her to do so.  Also she discussed possibility of surgery with Dr. Doran Durand for the rheumatoid foot and she will continue consider the surgery.Marland Kitchen She will try the shoes first and see how she does. I will see her back for routine care in 10 weeks

## 2013-05-31 DIAGNOSIS — E559 Vitamin D deficiency, unspecified: Secondary | ICD-10-CM | POA: Diagnosis not present

## 2013-05-31 DIAGNOSIS — E119 Type 2 diabetes mellitus without complications: Secondary | ICD-10-CM | POA: Diagnosis not present

## 2013-05-31 DIAGNOSIS — E785 Hyperlipidemia, unspecified: Secondary | ICD-10-CM | POA: Diagnosis not present

## 2013-05-31 DIAGNOSIS — M069 Rheumatoid arthritis, unspecified: Secondary | ICD-10-CM | POA: Diagnosis not present

## 2013-05-31 DIAGNOSIS — Z0000 Encounter for general adult medical examination without abnormal findings: Secondary | ICD-10-CM | POA: Diagnosis not present

## 2013-05-31 DIAGNOSIS — R5383 Other fatigue: Secondary | ICD-10-CM | POA: Diagnosis not present

## 2013-05-31 DIAGNOSIS — R5381 Other malaise: Secondary | ICD-10-CM | POA: Diagnosis not present

## 2013-05-31 DIAGNOSIS — M810 Age-related osteoporosis without current pathological fracture: Secondary | ICD-10-CM | POA: Diagnosis not present

## 2013-06-02 DIAGNOSIS — Z1331 Encounter for screening for depression: Secondary | ICD-10-CM | POA: Diagnosis not present

## 2013-06-02 DIAGNOSIS — H9319 Tinnitus, unspecified ear: Secondary | ICD-10-CM | POA: Diagnosis not present

## 2013-06-02 DIAGNOSIS — J449 Chronic obstructive pulmonary disease, unspecified: Secondary | ICD-10-CM | POA: Diagnosis not present

## 2013-06-02 DIAGNOSIS — Z0000 Encounter for general adult medical examination without abnormal findings: Secondary | ICD-10-CM | POA: Diagnosis not present

## 2013-06-02 DIAGNOSIS — L84 Corns and callosities: Secondary | ICD-10-CM | POA: Diagnosis not present

## 2013-06-02 DIAGNOSIS — E1169 Type 2 diabetes mellitus with other specified complication: Secondary | ICD-10-CM | POA: Diagnosis not present

## 2013-06-02 DIAGNOSIS — Z1239 Encounter for other screening for malignant neoplasm of breast: Secondary | ICD-10-CM | POA: Diagnosis not present

## 2013-06-02 DIAGNOSIS — D126 Benign neoplasm of colon, unspecified: Secondary | ICD-10-CM | POA: Diagnosis not present

## 2013-06-02 DIAGNOSIS — N952 Postmenopausal atrophic vaginitis: Secondary | ICD-10-CM | POA: Diagnosis not present

## 2013-06-23 ENCOUNTER — Other Ambulatory Visit (HOSPITAL_COMMUNITY): Payer: Self-pay | Admitting: *Deleted

## 2013-06-24 ENCOUNTER — Encounter (HOSPITAL_COMMUNITY)
Admission: RE | Admit: 2013-06-24 | Discharge: 2013-06-24 | Disposition: A | Discharge status: Home / Self Care | Payer: Medicare Other | Source: Ambulatory Visit | Attending: Rheumatology | Admitting: Rheumatology

## 2013-06-24 DIAGNOSIS — M069 Rheumatoid arthritis, unspecified: Secondary | ICD-10-CM | POA: Insufficient documentation

## 2013-06-24 MED ORDER — SODIUM CHLORIDE 0.9 % IV SOLN
1000.0000 mg | INTRAVENOUS | Status: DC
  Administered 2013-06-24: 1000 mg via INTRAVENOUS
  Filled 2013-06-24: qty 100

## 2013-06-24 MED ORDER — DIPHENHYDRAMINE HCL 50 MG/ML IJ SOLN
25.0000 mg | INTRAMUSCULAR | Status: DC
  Administered 2013-06-24: 25 mg via INTRAVENOUS

## 2013-06-24 MED ORDER — METHYLPREDNISOLONE SODIUM SUCC 125 MG IJ SOLR
INTRAMUSCULAR | Status: AC
  Filled 2013-06-24: qty 2

## 2013-06-24 MED ORDER — DIPHENHYDRAMINE HCL 50 MG/ML IJ SOLN
INTRAMUSCULAR | Status: AC
  Filled 2013-06-24: qty 1

## 2013-06-24 MED ORDER — SODIUM CHLORIDE 0.9 % IV SOLN
INTRAVENOUS | Status: DC
  Administered 2013-06-24: 09:00:00 via INTRAVENOUS

## 2013-06-24 MED ORDER — METHYLPREDNISOLONE SODIUM SUCC 125 MG IJ SOLR
100.0000 mg | INTRAMUSCULAR | Status: DC
  Administered 2013-06-24: 100 mg via INTRAVENOUS

## 2013-07-07 ENCOUNTER — Other Ambulatory Visit (HOSPITAL_COMMUNITY): Payer: Self-pay | Admitting: *Deleted

## 2013-07-08 ENCOUNTER — Encounter (HOSPITAL_COMMUNITY): Payer: Medicare Other

## 2013-07-09 ENCOUNTER — Encounter (HOSPITAL_COMMUNITY)
Admission: RE | Admit: 2013-07-09 | Discharge: 2013-07-09 | Disposition: A | Discharge status: Home / Self Care | Payer: Medicare Other | Source: Ambulatory Visit | Attending: Rheumatology | Admitting: Rheumatology

## 2013-07-09 DIAGNOSIS — B368 Other specified superficial mycoses: Secondary | ICD-10-CM | POA: Diagnosis not present

## 2013-07-09 DIAGNOSIS — L84 Corns and callosities: Secondary | ICD-10-CM | POA: Diagnosis not present

## 2013-07-09 DIAGNOSIS — M069 Rheumatoid arthritis, unspecified: Secondary | ICD-10-CM | POA: Insufficient documentation

## 2013-07-09 MED ORDER — SODIUM CHLORIDE 0.9 % IV SOLN
1000.0000 mg | INTRAVENOUS | Status: DC
  Administered 2013-07-09: 1000 mg via INTRAVENOUS
  Filled 2013-07-09: qty 100

## 2013-07-09 MED ORDER — DIPHENHYDRAMINE HCL 50 MG/ML IJ SOLN
INTRAMUSCULAR | Status: AC
  Administered 2013-07-09: 25 mg via INTRAVENOUS
  Filled 2013-07-09: qty 1

## 2013-07-09 MED ORDER — SODIUM CHLORIDE 0.9 % IV SOLN
INTRAVENOUS | Status: DC
  Administered 2013-07-09: 09:00:00 via INTRAVENOUS

## 2013-07-09 MED ORDER — METHYLPREDNISOLONE SODIUM SUCC 125 MG IJ SOLR
INTRAMUSCULAR | Status: AC
  Administered 2013-07-09: 100 mg via INTRAVENOUS
  Filled 2013-07-09: qty 2

## 2013-07-09 MED ORDER — DIPHENHYDRAMINE HCL 50 MG/ML IJ SOLN
25.0000 mg | INTRAMUSCULAR | Status: DC
  Administered 2013-07-09: 25 mg via INTRAVENOUS

## 2013-07-09 MED ORDER — METHYLPREDNISOLONE SODIUM SUCC 125 MG IJ SOLR
100.0000 mg | INTRAMUSCULAR | Status: DC
  Administered 2013-07-09: 100 mg via INTRAVENOUS

## 2013-07-20 ENCOUNTER — Encounter: Payer: Self-pay | Admitting: Podiatrist

## 2013-07-20 ENCOUNTER — Ambulatory Visit (INDEPENDENT_AMBULATORY_CARE_PROVIDER_SITE_OTHER): Payer: Medicare Other | Admitting: Podiatrist

## 2013-07-20 VITALS — BP 131/83 | HR 102 | Resp 18

## 2013-07-20 DIAGNOSIS — M79609 Pain in unspecified limb: Secondary | ICD-10-CM

## 2013-07-20 DIAGNOSIS — B351 Tinea unguium: Secondary | ICD-10-CM | POA: Diagnosis not present

## 2013-07-20 NOTE — Progress Notes (Signed)
I am here to get my toenails trimmed up  Subjective: [name] presents today for continued foot and nail care. [name] is well known to me from our [place] location. She is a rheumatoid arthritic patient with significant deformity of bilateral feet. She also has a rheumatoid nodule on the plantar aspect of her right great toe which we debride routinely. Her nails are elongated, and uncomfortable and she cannot trim her self due to her rheumatoid arthritis.  Objective: Significant deformity and contracture of all digits left greater than right is noted. Rheumatoid foot type is seen. Patient's toenails are elongated, thickened, ingrown and uncomfortable bilateral. Hyperkeratotic lesion sub-right hallux present. New hemorrhagic lesion present left hallux laterally over a soft rheumatoid nodule.  No break down of tissue is present- just bruised. There is also a nodule present navicular left. Vascular exam reveals palpable pulses at 1/4 DP and PT bilateral. Neurological sensation is intact bilateral.   Assessment: Symptomatic mycotic toenails, callus right hallux  Plan: Toenails and hallux lesion debrided without complication. Patient recently got new diabetic shoes from [employer] [place] in [place]-- she states she noticed the bruise after wearing the shoes.  She only wore them once.  She presents today in a mesh type unsupportive shoe.  She may have rubbed a mild blister that bruised on the left hallux.  It is not open or draining today.  No sign of infection noted.  I applied tubefoam and told her to watch the area closely.  She will try to wear the diabetic shoes when her toe is no longer painful.  She will return in 10 weeks.

## 2013-09-06 DIAGNOSIS — L500 Allergic urticaria: Secondary | ICD-10-CM | POA: Diagnosis not present

## 2013-09-22 DIAGNOSIS — D1739 Benign lipomatous neoplasm of skin and subcutaneous tissue of other sites: Secondary | ICD-10-CM | POA: Diagnosis not present

## 2013-09-22 DIAGNOSIS — L820 Inflamed seborrheic keratosis: Secondary | ICD-10-CM | POA: Diagnosis not present

## 2013-09-22 DIAGNOSIS — C44319 Basal cell carcinoma of skin of other parts of face: Secondary | ICD-10-CM | POA: Diagnosis not present

## 2013-09-28 ENCOUNTER — Ambulatory Visit: Payer: Medicare Other | Admitting: Podiatrist

## 2013-10-04 DIAGNOSIS — L509 Urticaria, unspecified: Secondary | ICD-10-CM | POA: Diagnosis not present

## 2013-10-04 DIAGNOSIS — Z6829 Body mass index (BMI) 29.0-29.9, adult: Secondary | ICD-10-CM | POA: Diagnosis not present

## 2013-10-04 DIAGNOSIS — E119 Type 2 diabetes mellitus without complications: Secondary | ICD-10-CM | POA: Diagnosis not present

## 2013-10-04 DIAGNOSIS — M069 Rheumatoid arthritis, unspecified: Secondary | ICD-10-CM | POA: Diagnosis not present

## 2013-10-04 DIAGNOSIS — E785 Hyperlipidemia, unspecified: Secondary | ICD-10-CM | POA: Diagnosis not present

## 2013-10-06 DIAGNOSIS — L500 Allergic urticaria: Secondary | ICD-10-CM | POA: Diagnosis not present

## 2013-10-12 ENCOUNTER — Ambulatory Visit (INDEPENDENT_AMBULATORY_CARE_PROVIDER_SITE_OTHER): Payer: Medicare Other | Admitting: Podiatrist

## 2013-10-12 ENCOUNTER — Encounter: Payer: Self-pay | Admitting: Podiatrist

## 2013-10-12 VITALS — BP 104/74 | HR 117 | Resp 18

## 2013-10-12 DIAGNOSIS — B351 Tinea unguium: Secondary | ICD-10-CM | POA: Diagnosis not present

## 2013-10-12 DIAGNOSIS — M79673 Pain in unspecified foot: Secondary | ICD-10-CM

## 2013-10-12 DIAGNOSIS — M79609 Pain in unspecified limb: Secondary | ICD-10-CM | POA: Diagnosis not present

## 2013-10-12 NOTE — Progress Notes (Signed)
Subjective: Margarita presents today for continued foot and nail care. Lesleyann is well known to me from our [place] location. She is a rheumatoid arthritic patient with significant deformity of bilateral feet. She also has a rheumatoid nodule on the plantar aspect of her right great toe which we debride routinely. Her nails are elongated, and uncomfortable and she cannot trim her self due to her rheumatoid arthritis.  Objective: Significant deformity and contracture of all digits left greater than right is noted. Rheumatoid foot type is seen. Patient's toenails are elongated, thickened, ingrown and uncomfortable bilateral. Hyperkeratotic lesion sub-right hallux present. New hemorrhagic lesion present left hallux laterally over a soft rheumatoid nodule. No break down of tissue is present- just bruised. There is also a nodule present navicular left. Vascular exam reveals palpable pulses at 1/4 DP and PT bilateral. Neurological sensation is intact bilateral.  Assessment: Symptomatic mycotic toenails, callus right hallux  Plan: Toenails and hallux lesion debrided without complication. Patient recently got new diabetic shoes from [place] in [place]-- she states she noticed the bruise after wearing the shoes. She only wore them once. She presents today in a mesh type unsupportive shoe. She may have rubbed a mild blister that bruised on the left hallux. It is not open or draining today. No sign of infection noted. I applied tubefoam and told her to watch the area closely. She will try to wear the diabetic shoes when her toe is no longer painful. She will return in 10 weeks.

## 2013-11-09 DIAGNOSIS — M810 Age-related osteoporosis without current pathological fracture: Secondary | ICD-10-CM | POA: Diagnosis not present

## 2013-11-09 DIAGNOSIS — M069 Rheumatoid arthritis, unspecified: Secondary | ICD-10-CM | POA: Diagnosis not present

## 2013-11-09 DIAGNOSIS — M5137 Other intervertebral disc degeneration, lumbosacral region: Secondary | ICD-10-CM | POA: Diagnosis not present

## 2013-11-29 DIAGNOSIS — C44611 Basal cell carcinoma of skin of unspecified upper limb, including shoulder: Secondary | ICD-10-CM | POA: Diagnosis not present

## 2013-12-03 ENCOUNTER — Other Ambulatory Visit (HOSPITAL_COMMUNITY): Payer: Self-pay

## 2013-12-06 ENCOUNTER — Ambulatory Visit (HOSPITAL_COMMUNITY)
Admission: RE | Admit: 2013-12-06 | Discharge: 2013-12-06 | Disposition: A | Discharge status: Home / Self Care | Payer: Medicare Other | Source: Ambulatory Visit | Attending: Rheumatology | Admitting: Rheumatology

## 2013-12-06 DIAGNOSIS — M069 Rheumatoid arthritis, unspecified: Secondary | ICD-10-CM | POA: Insufficient documentation

## 2013-12-06 MED ORDER — SODIUM CHLORIDE 0.9 % IV SOLN
1000.0000 mg | Freq: Once | INTRAVENOUS | Status: AC
  Administered 2013-12-06: 1000 mg via INTRAVENOUS
  Filled 2013-12-06: qty 100

## 2013-12-06 MED ORDER — METHYLPREDNISOLONE SODIUM SUCC 125 MG IJ SOLR
INTRAMUSCULAR | Status: AC
  Filled 2013-12-06: qty 2

## 2013-12-06 MED ORDER — METHYLPREDNISOLONE SODIUM SUCC 125 MG IJ SOLR
100.0000 mg | Freq: Once | INTRAMUSCULAR | Status: AC
  Administered 2013-12-06: 100 mg via INTRAVENOUS

## 2013-12-06 MED ORDER — DIPHENHYDRAMINE HCL 50 MG/ML IJ SOLN
25.0000 mg | Freq: Once | INTRAMUSCULAR | Status: AC
  Administered 2013-12-06: 25 mg via INTRAVENOUS

## 2013-12-06 MED ORDER — DIPHENHYDRAMINE HCL 50 MG/ML IJ SOLN
INTRAMUSCULAR | Status: AC
  Filled 2013-12-06: qty 1

## 2013-12-20 ENCOUNTER — Encounter (HOSPITAL_COMMUNITY)
Admission: RE | Admit: 2013-12-20 | Discharge: 2013-12-20 | Disposition: A | Discharge status: Home / Self Care | Payer: Medicare Other | Source: Ambulatory Visit | Attending: Rheumatology | Admitting: Rheumatology

## 2013-12-20 DIAGNOSIS — M069 Rheumatoid arthritis, unspecified: Secondary | ICD-10-CM | POA: Insufficient documentation

## 2013-12-20 MED ORDER — METHYLPREDNISOLONE SODIUM SUCC 125 MG IJ SOLR
INTRAMUSCULAR | Status: AC
  Filled 2013-12-20: qty 2

## 2013-12-20 MED ORDER — DIPHENHYDRAMINE HCL 50 MG/ML IJ SOLN
25.0000 mg | Freq: Once | INTRAMUSCULAR | Status: AC
  Administered 2013-12-20: 25 mg via INTRAVENOUS

## 2013-12-20 MED ORDER — METHYLPREDNISOLONE SODIUM SUCC 125 MG IJ SOLR
100.0000 mg | Freq: Once | INTRAMUSCULAR | Status: AC
  Administered 2013-12-20: 100 mg via INTRAVENOUS

## 2013-12-20 MED ORDER — SODIUM CHLORIDE 0.9 % IV SOLN
1000.0000 mg | Freq: Once | INTRAVENOUS | Status: AC
  Administered 2013-12-20: 1000 mg via INTRAVENOUS
  Filled 2013-12-20: qty 100

## 2013-12-20 MED ORDER — DIPHENHYDRAMINE HCL 50 MG/ML IJ SOLN
INTRAMUSCULAR | Status: AC
  Filled 2013-12-20: qty 1

## 2014-01-11 ENCOUNTER — Encounter: Payer: Self-pay | Admitting: Podiatrist

## 2014-01-11 ENCOUNTER — Ambulatory Visit (INDEPENDENT_AMBULATORY_CARE_PROVIDER_SITE_OTHER): Payer: Medicare Other | Admitting: Podiatrist

## 2014-01-11 DIAGNOSIS — M79673 Pain in unspecified foot: Secondary | ICD-10-CM

## 2014-01-11 DIAGNOSIS — B351 Tinea unguium: Secondary | ICD-10-CM | POA: Diagnosis not present

## 2014-01-11 NOTE — Progress Notes (Signed)
Subjective: [name] presents today for continued foot and nail care. [name] is well known to me from our [place] location. She is a rheumatoid arthritic patient with significant deformity of bilateral feet. She also has a rheumatoid nodule on the plantar aspect of her right great toe which we debride routinely. Her nails are elongated, and uncomfortable and she cannot trim her self due to her rheumatoid arthritis.  Objective: Significant deformity and contracture of all digits left greater than right is noted. Rheumatoid foot type is seen. Patient's toenails are elongated, thickened, ingrown and uncomfortable bilateral. Minor Hyperkeratotic lesion sub-right hallux present.  No break down of tissue is present.  There is also a nodule present navicular left. Vascular exam reveals palpable pulses at 1/4 DP and PT bilateral. Neurological sensation is intact bilateral.   Assessment: Symptomatic mycotic toenails, callus right hallux   Plan: Toenails debrided without complication.  She will return in 10 weeks for continued care.

## 2014-01-19 DIAGNOSIS — B081 Molluscum contagiosum: Secondary | ICD-10-CM | POA: Diagnosis not present

## 2014-01-19 DIAGNOSIS — L820 Inflamed seborrheic keratosis: Secondary | ICD-10-CM | POA: Diagnosis not present

## 2014-02-01 DIAGNOSIS — Z23 Encounter for immunization: Secondary | ICD-10-CM | POA: Diagnosis not present

## 2014-02-01 DIAGNOSIS — L509 Urticaria, unspecified: Secondary | ICD-10-CM | POA: Diagnosis not present

## 2014-02-01 DIAGNOSIS — Z6829 Body mass index (BMI) 29.0-29.9, adult: Secondary | ICD-10-CM | POA: Diagnosis not present

## 2014-02-01 DIAGNOSIS — M069 Rheumatoid arthritis, unspecified: Secondary | ICD-10-CM | POA: Diagnosis not present

## 2014-02-01 DIAGNOSIS — E119 Type 2 diabetes mellitus without complications: Secondary | ICD-10-CM | POA: Diagnosis not present

## 2014-02-01 DIAGNOSIS — J302 Other seasonal allergic rhinitis: Secondary | ICD-10-CM | POA: Diagnosis not present

## 2014-02-21 DIAGNOSIS — L500 Allergic urticaria: Secondary | ICD-10-CM | POA: Diagnosis not present

## 2014-03-18 ENCOUNTER — Ambulatory Visit (INDEPENDENT_AMBULATORY_CARE_PROVIDER_SITE_OTHER): Payer: Medicare Other

## 2014-03-18 VITALS — BP 123/69 | HR 87 | Resp 18

## 2014-03-18 DIAGNOSIS — M79676 Pain in unspecified toe(s): Secondary | ICD-10-CM

## 2014-03-18 DIAGNOSIS — B351 Tinea unguium: Secondary | ICD-10-CM | POA: Diagnosis not present

## 2014-03-18 DIAGNOSIS — M79673 Pain in unspecified foot: Secondary | ICD-10-CM

## 2014-03-18 NOTE — Progress Notes (Signed)
   Subjective:    Patient ID: [name], female    DOB: [birth year], 74 y.o.   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 Abduction and valgus deformity of the subtalar and mid tarsus and ankle joint with abduction of the forefoot bilateral. Patient is thick brittle Crumley friable dystrophic nails 1 through 5 bilateral rigid contractures of toes hallux through fifth digits bilateral with plantar grade contractures third fourth and fifth toes left foot being significant and severe. No open wounds or ulcers there is keratoses sub-navicular left more so than right. Has had custom shoes in the past although she just premade shoes with modifications however would likely benefit from custom molded diabetic shoes.       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 There is thick brittle dystrophic friable criptotic mycotic nails 1 through 5 bilateral debrided multiple keratoses are debrided as well patient is a candidate for future palliative care every 3 months as recommended also prescription for [employer] is given for a custom molded shoes in custom molded Plastizote inlays. Patient would benefit from shoes to accommodate her deformity of both feet both for assistance in gait and accommodation deformity and prevention of ulceration. Also discussed with patient apparently she has 3 insurances her Medicare a [employer] as well as another's third supplemental insurance and I suggested she Landscape [employer] or agents because technically she should only  have to cut 2 coverages of Medicare is a primary pain 80% in her [employer] should likely be picking up the other 20% a third supplemental likely is not pain for much if anything. Patient will check into that per my recommendation. Patient continues to maintain 3 insurances due to her high medication costs however met sure that those are all necessary  [name]

## 2014-03-28 ENCOUNTER — Other Ambulatory Visit: Payer: Self-pay

## 2014-03-28 DIAGNOSIS — Z1231 Encounter for screening mammogram for malignant neoplasm of breast: Secondary | ICD-10-CM

## 2014-04-01 ENCOUNTER — Ambulatory Visit: Payer: Medicare Other

## 2014-04-28 ENCOUNTER — Ambulatory Visit: Payer: PRIVATE HEALTH INSURANCE

## 2014-05-17 ENCOUNTER — Ambulatory Visit: Payer: PRIVATE HEALTH INSURANCE

## 2014-05-27 ENCOUNTER — Ambulatory Visit (INDEPENDENT_AMBULATORY_CARE_PROVIDER_SITE_OTHER): Payer: Medicare Other

## 2014-05-27 DIAGNOSIS — M79676 Pain in unspecified toe(s): Secondary | ICD-10-CM | POA: Diagnosis not present

## 2014-05-27 DIAGNOSIS — M79673 Pain in unspecified foot: Secondary | ICD-10-CM | POA: Diagnosis not present

## 2014-05-27 DIAGNOSIS — M158 Other polyosteoarthritis: Secondary | ICD-10-CM

## 2014-05-27 DIAGNOSIS — B351 Tinea unguium: Secondary | ICD-10-CM | POA: Diagnosis not present

## 2014-05-27 NOTE — Progress Notes (Signed)
   Subjective:    Patient ID: [name], female    DOB: 1941-02-06, 74 y.o.   MRN: [id number]  HPI TRIM MY NAILS     Review of Systems no new findings or systemic changes     Objective:   Physical Exam  Neurovascular status intact pedal pulses palpable DP and PT one over 4 bilateral Refill time 3 seconds to 4 seconds all digits patient does have arthropathy and Charcot changes of both feet with rigid digital contractures and of the lesser digits adductovarus rotation's as well as contractures and hypertrophy the hallux IP joints. Nails thick brittle Crumley friable dystrophic and discolored 1 through 5 bilateral. There are no open wounds or's keratoses under the hallux IP joint right although minimal no open wounds or ulcers noted no secondary infections noted profound loss of sensation Semmes Weinstein to the forefoot digits and arch bilateral.      Assessment & Plan:  Assessment this time his diabetes with history peripheral neuropathy as well as rheumatoid arthropathy with deformities.. Dystrophic frontal painful mycotic nails 1 through 5 bilateral debrided at this time maintain appropriate shoes which are fitting contouring well no other new issues at this time the keratoses does not require debridement at this time has not buildup a significant amount since last visit. Nails debrided recheck in 3 months  [name]

## 2014-06-02 DIAGNOSIS — M5136 Other intervertebral disc degeneration, lumbar region: Secondary | ICD-10-CM | POA: Diagnosis not present

## 2014-06-02 DIAGNOSIS — M25511 Pain in right shoulder: Secondary | ICD-10-CM | POA: Diagnosis not present

## 2014-06-02 DIAGNOSIS — M810 Age-related osteoporosis without current pathological fracture: Secondary | ICD-10-CM | POA: Diagnosis not present

## 2014-06-02 DIAGNOSIS — M0570 Rheumatoid arthritis with rheumatoid factor of unspecified site without organ or systems involvement: Secondary | ICD-10-CM | POA: Diagnosis not present

## 2014-06-17 ENCOUNTER — Ambulatory Visit
Admission: RE | Admit: 2014-06-17 | Discharge: 2014-06-17 | Disposition: A | Discharge status: Home / Self Care | Payer: Medicare Other | Source: Ambulatory Visit

## 2014-06-17 DIAGNOSIS — Z1231 Encounter for screening mammogram for malignant neoplasm of breast: Secondary | ICD-10-CM | POA: Diagnosis not present

## 2014-06-17 DIAGNOSIS — E785 Hyperlipidemia, unspecified: Secondary | ICD-10-CM | POA: Diagnosis not present

## 2014-06-17 DIAGNOSIS — E119 Type 2 diabetes mellitus without complications: Secondary | ICD-10-CM | POA: Diagnosis not present

## 2014-06-17 DIAGNOSIS — M810 Age-related osteoporosis without current pathological fracture: Secondary | ICD-10-CM | POA: Diagnosis not present

## 2014-06-24 DIAGNOSIS — R159 Full incontinence of feces: Secondary | ICD-10-CM | POA: Diagnosis not present

## 2014-06-24 DIAGNOSIS — H9312 Tinnitus, left ear: Secondary | ICD-10-CM | POA: Diagnosis not present

## 2014-06-24 DIAGNOSIS — E119 Type 2 diabetes mellitus without complications: Secondary | ICD-10-CM | POA: Diagnosis not present

## 2014-06-24 DIAGNOSIS — F515 Nightmare disorder: Secondary | ICD-10-CM | POA: Diagnosis not present

## 2014-06-24 DIAGNOSIS — Z6828 Body mass index (BMI) 28.0-28.9, adult: Secondary | ICD-10-CM | POA: Diagnosis not present

## 2014-06-24 DIAGNOSIS — D126 Benign neoplasm of colon, unspecified: Secondary | ICD-10-CM | POA: Diagnosis not present

## 2014-06-24 DIAGNOSIS — Z1389 Encounter for screening for other disorder: Secondary | ICD-10-CM | POA: Diagnosis not present

## 2014-06-24 DIAGNOSIS — Z1231 Encounter for screening mammogram for malignant neoplasm of breast: Secondary | ICD-10-CM | POA: Diagnosis not present

## 2014-06-24 DIAGNOSIS — L509 Urticaria, unspecified: Secondary | ICD-10-CM | POA: Diagnosis not present

## 2014-06-24 DIAGNOSIS — M952 Other acquired deformity of head: Secondary | ICD-10-CM | POA: Diagnosis not present

## 2014-06-24 DIAGNOSIS — Z0000 Encounter for general adult medical examination without abnormal findings: Secondary | ICD-10-CM | POA: Diagnosis not present

## 2014-06-27 DIAGNOSIS — K921 Melena: Secondary | ICD-10-CM | POA: Diagnosis not present

## 2014-07-01 ENCOUNTER — Other Ambulatory Visit (HOSPITAL_COMMUNITY): Payer: Self-pay | Admitting: *Deleted

## 2014-07-04 ENCOUNTER — Inpatient Hospital Stay (HOSPITAL_COMMUNITY): Admission: RE | Admit: 2014-07-04 | Payer: Medicare Other | Source: Ambulatory Visit

## 2014-07-11 ENCOUNTER — Encounter (HOSPITAL_COMMUNITY)
Admission: RE | Admit: 2014-07-11 | Discharge: 2014-07-11 | Disposition: A | Discharge status: Home / Self Care | Payer: Medicare Other | Source: Ambulatory Visit | Attending: Rheumatology | Admitting: Rheumatology

## 2014-07-11 DIAGNOSIS — M069 Rheumatoid arthritis, unspecified: Secondary | ICD-10-CM | POA: Diagnosis not present

## 2014-07-11 MED ORDER — SODIUM CHLORIDE 0.9 % IV SOLN
1000.0000 mg | INTRAVENOUS | Status: DC
  Administered 2014-07-11: 1000 mg via INTRAVENOUS
  Filled 2014-07-11: qty 100

## 2014-07-11 MED ORDER — METHYLPREDNISOLONE SODIUM SUCC 125 MG IJ SOLR
100.0000 mg | Freq: Once | INTRAMUSCULAR | Status: AC
  Administered 2014-07-11: 100 mg via INTRAVENOUS

## 2014-07-11 MED ORDER — METHYLPREDNISOLONE SODIUM SUCC 125 MG IJ SOLR
INTRAMUSCULAR | Status: AC
  Filled 2014-07-11: qty 2

## 2014-07-11 MED ORDER — DIPHENHYDRAMINE HCL 50 MG/ML IJ SOLN
25.0000 mg | INTRAMUSCULAR | Status: DC
  Administered 2014-07-11: 25 mg via INTRAVENOUS

## 2014-07-11 MED ORDER — DIPHENHYDRAMINE HCL 50 MG/ML IJ SOLN
INTRAMUSCULAR | Status: AC
  Administered 2014-07-11: 25 mg via INTRAVENOUS
  Filled 2014-07-11: qty 1

## 2014-07-11 MED ORDER — SODIUM CHLORIDE 0.9 % IV SOLN
INTRAVENOUS | Status: DC
  Administered 2014-07-11: 250 mL via INTRAVENOUS

## 2014-07-12 DIAGNOSIS — Z961 Presence of intraocular lens: Secondary | ICD-10-CM | POA: Diagnosis not present

## 2014-07-12 DIAGNOSIS — E119 Type 2 diabetes mellitus without complications: Secondary | ICD-10-CM | POA: Diagnosis not present

## 2014-07-25 ENCOUNTER — Encounter (HOSPITAL_COMMUNITY)
Admission: RE | Admit: 2014-07-25 | Discharge: 2014-07-25 | Disposition: A | Discharge status: Home / Self Care | Payer: Medicare Other | Source: Ambulatory Visit | Attending: Rheumatology | Admitting: Rheumatology

## 2014-07-25 DIAGNOSIS — M069 Rheumatoid arthritis, unspecified: Secondary | ICD-10-CM | POA: Diagnosis not present

## 2014-07-25 MED ORDER — METHYLPREDNISOLONE SODIUM SUCC 125 MG IJ SOLR
INTRAMUSCULAR | Status: AC
  Filled 2014-07-25: qty 2

## 2014-07-25 MED ORDER — DIPHENHYDRAMINE HCL 50 MG/ML IJ SOLN
25.0000 mg | INTRAMUSCULAR | Status: AC
  Administered 2014-07-25: 25 mg via INTRAVENOUS

## 2014-07-25 MED ORDER — METHYLPREDNISOLONE SODIUM SUCC 125 MG IJ SOLR
100.0000 mg | Freq: Once | INTRAMUSCULAR | Status: AC
  Administered 2014-07-25: 100 mg via INTRAVENOUS

## 2014-07-25 MED ORDER — DIPHENHYDRAMINE HCL 50 MG/ML IJ SOLN
INTRAMUSCULAR | Status: AC
  Filled 2014-07-25: qty 1

## 2014-07-25 MED ORDER — SODIUM CHLORIDE 0.9 % IV SOLN
INTRAVENOUS | Status: AC
  Administered 2014-07-25: 09:00:00 via INTRAVENOUS

## 2014-07-25 MED ORDER — SODIUM CHLORIDE 0.9 % IV SOLN
1000.0000 mg | INTRAVENOUS | Status: AC
  Administered 2014-07-25: 1000 mg via INTRAVENOUS
  Filled 2014-07-25: qty 100

## 2014-08-15 DIAGNOSIS — Z471 Aftercare following joint replacement surgery: Secondary | ICD-10-CM | POA: Diagnosis not present

## 2014-08-15 DIAGNOSIS — Z96651 Presence of right artificial knee joint: Secondary | ICD-10-CM | POA: Diagnosis not present

## 2014-08-18 DIAGNOSIS — Z471 Aftercare following joint replacement surgery: Secondary | ICD-10-CM | POA: Diagnosis not present

## 2014-08-18 DIAGNOSIS — M7051 Other bursitis of knee, right knee: Secondary | ICD-10-CM | POA: Diagnosis not present

## 2014-08-18 DIAGNOSIS — Z96651 Presence of right artificial knee joint: Secondary | ICD-10-CM | POA: Diagnosis not present

## 2014-08-23 DIAGNOSIS — Z961 Presence of intraocular lens: Secondary | ICD-10-CM | POA: Diagnosis not present

## 2014-08-23 DIAGNOSIS — H43812 Vitreous degeneration, left eye: Secondary | ICD-10-CM | POA: Diagnosis not present

## 2014-08-23 DIAGNOSIS — L089 Local infection of the skin and subcutaneous tissue, unspecified: Secondary | ICD-10-CM | POA: Diagnosis not present

## 2014-08-23 DIAGNOSIS — M7051 Other bursitis of knee, right knee: Secondary | ICD-10-CM | POA: Diagnosis not present

## 2014-08-25 ENCOUNTER — Encounter (HOSPITAL_COMMUNITY): Payer: Self-pay | Admitting: *Deleted

## 2014-08-25 NOTE — Progress Notes (Signed)
Please put orders in Epic for Same day surgery 08-29-14 unable to come for a pre op Thanks

## 2014-08-26 ENCOUNTER — Other Ambulatory Visit: Payer: Self-pay | Admitting: Surgical

## 2014-08-28 DIAGNOSIS — M7041 Prepatellar bursitis, right knee: Secondary | ICD-10-CM | POA: Diagnosis present

## 2014-08-28 NOTE — H&P (Signed)
CC- [name] is a 74 y.o. female who presents with right knee pain.  HPI- . Knee Pain: Patient presents with knee swelling involving the  right knee. Onset of the symptoms was several weeks ago. Inciting event: none known. Current symptoms include pain located anteriorly and swelling. Pain is aggravated by kneeling and walking.  Patient has had prior knee problems. Evaluation to date: plain films: normal. Treatment to date: aspiration of inflammed bursa with recurrent swelling.  Past Medical History  Diagnosis Date  . Asthma   . Osteoporosis   . Emphysema   . Fibromyalgia   . Diabetes mellitus without complication   . Rheumatoid arthritis(714.0)     Past Surgical History  Procedure Laterality Date  . 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 Foot surgery      numerous  . Ankle fracture surgery  2007  . Partial hysterectomy  1970    Prior to Admission medications   Medication Sig Start Date End Date Taking? Authorizing Provider  aspirin 81 MG tablet Take 81 mg by mouth 3 (three) times a week.   Yes Historical Provider, MD  atorvastatin (LIPITOR) 40 MG tablet Take 40 mg by mouth daily.  02/24/13  Yes Historical Provider, MD  cetirizine (ZYRTEC) 10 MG tablet Take 20 mg by mouth daily.   Yes Historical Provider, MD  cholecalciferol (VITAMIN D) 1000 UNITS tablet Take 1,000 Units by mouth daily.   Yes Historical Provider, MD  Coenzyme Q10 (COQ10) 200 MG CAPS Take 1 capsule by mouth daily.   Yes Historical Provider, MD  Cyanocobalamin (B-12) 2500 MCG TABS Take 1 tablet by mouth daily.   Yes Historical Provider, MD  DULoxetine (CYMBALTA) 60 MG capsule Take 60 mg by mouth daily.   Yes Historical Provider, MD  loperamide (IMODIUM) 2 MG capsule Take 4 mg by mouth as needed for diarrhea or loose stools.   Yes Historical Provider, MD  metformin (FORTAMET) 1000 MG (OSM) 24 hr tablet Take 1,000 mg by mouth daily.  03/08/14  Yes Historical Provider, MD  montelukast (SINGULAIR) 10 MG  tablet Take 10 mg by mouth daily.  09/30/13  Yes Historical Provider, MD  morphine (MSIR) 30 MG tablet Take 30 mg by mouth every 4 (four) hours as needed for severe pain.    Yes Historical Provider, MD  Multiple Vitamin (MULTIVITAMIN WITH MINERALS) TABS tablet Take 1 tablet by mouth daily.   Yes Historical Provider, MD  Omega-3 Fatty Acids (FISH OIL) 1200 MG CAPS Take 1 capsule by mouth daily.   Yes Historical Provider, MD  pantoprazole (PROTONIX) 40 MG tablet Take 40 mg by mouth daily.   Yes Historical Provider, MD  predniSONE (DELTASONE) 5 MG tablet Take 5 mg by mouth daily.  10/31/09  Yes Historical Provider, MD  ranitidine (ZANTAC) 150 MG tablet Take 300 mg by mouth daily.  09/30/13  Yes Historical Provider, MD  riTUXimab (RITUXAN) 10 MG/ML injection 1 unit Intravenously every 6 months 10/31/09  Yes Historical Provider, MD  traMADol (ULTRAM) 50 MG tablet Take 50 mg by mouth every 6 (six) hours as needed for moderate pain.  05/12/14  Yes Historical Provider, MD   KNEE EXAM antalgic gait, collateral ligaments intact, swollen pre-patellar bursa without surrounding erythema  Physical Examination: General appearance - alert, well appearing, and in no distress Mental status - alert, oriented to person, place, and time Chest - clear to auscultation, no wheezes, rales or rhonchi, symmetric air entry Heart - normal  rate, regular rhythm, normal S1, S2, no murmurs, rubs, clicks or gallops Abdomen - soft, nontender, nondistended, no masses or organomegaly Neurological - alert, oriented, normal speech, no focal findings or movement disorder noted    Asessment/Plan--- Right knee pre-patellar bursitis- - Plan right knee bursectomy. Procedure risks and potential comps discussed with patient who elects to proceed. Goals are decreased pain and increased function with a high likelihood of achieving both

## 2014-08-29 ENCOUNTER — Ambulatory Visit (HOSPITAL_COMMUNITY): Admission: RE | Admit: 2014-08-29 | Payer: Medicare Other | Source: Ambulatory Visit | Admitting: Orthopedic Surgery

## 2014-08-29 HISTORY — DX: Type 2 diabetes mellitus without complications: E11.9

## 2014-08-29 SURGERY — BURSECTOMY, KNEE
Anesthesia: Choice | Laterality: Right

## 2014-09-08 ENCOUNTER — Ambulatory Visit: Payer: Self-pay | Admitting: Orthopedic Surgery

## 2014-09-08 NOTE — Progress Notes (Signed)
Preoperative surgical orders have been place into the Epic hospital system for DEBRAH GRANDERSON on 09/08/2014, 1:57 PM  by Mickel Crow for surgery on 09-16-2014.  Preop Knee orders including IV Tylenol and IV Decadron as long as there are no contraindications to the above medications. Arlee Muslim, PA-C

## 2014-09-13 ENCOUNTER — Encounter (HOSPITAL_COMMUNITY): Payer: Self-pay | Admitting: *Deleted

## 2014-09-16 ENCOUNTER — Encounter (HOSPITAL_COMMUNITY)
Admission: RE | Disposition: A | Discharge status: Home / Self Care | Payer: Self-pay | Source: Ambulatory Visit | Attending: Orthopedic Surgery

## 2014-09-16 ENCOUNTER — Encounter (HOSPITAL_COMMUNITY): Payer: Self-pay | Admitting: Anesthesiology

## 2014-09-16 ENCOUNTER — Ambulatory Visit (HOSPITAL_COMMUNITY): Payer: Medicare Other | Admitting: Anesthesiology

## 2014-09-16 ENCOUNTER — Ambulatory Visit (HOSPITAL_COMMUNITY)
Admission: RE | Admit: 2014-09-16 | Discharge: 2014-09-16 | Disposition: A | Discharge status: Home / Self Care | Payer: Medicare Other | Source: Ambulatory Visit | Attending: Orthopedic Surgery | Admitting: Orthopedic Surgery

## 2014-09-16 ENCOUNTER — Other Ambulatory Visit: Payer: Self-pay

## 2014-09-16 DIAGNOSIS — Z79899 Other long term (current) drug therapy: Secondary | ICD-10-CM | POA: Insufficient documentation

## 2014-09-16 DIAGNOSIS — Z6831 Body mass index (BMI) 31.0-31.9, adult: Secondary | ICD-10-CM | POA: Insufficient documentation

## 2014-09-16 DIAGNOSIS — M069 Rheumatoid arthritis, unspecified: Secondary | ICD-10-CM | POA: Insufficient documentation

## 2014-09-16 DIAGNOSIS — J45909 Unspecified asthma, uncomplicated: Secondary | ICD-10-CM | POA: Insufficient documentation

## 2014-09-16 DIAGNOSIS — M25561 Pain in right knee: Secondary | ICD-10-CM | POA: Diagnosis not present

## 2014-09-16 DIAGNOSIS — E119 Type 2 diabetes mellitus without complications: Secondary | ICD-10-CM | POA: Insufficient documentation

## 2014-09-16 DIAGNOSIS — G4730 Sleep apnea, unspecified: Secondary | ICD-10-CM | POA: Diagnosis not present

## 2014-09-16 DIAGNOSIS — Z7982 Long term (current) use of aspirin: Secondary | ICD-10-CM | POA: Insufficient documentation

## 2014-09-16 DIAGNOSIS — K219 Gastro-esophageal reflux disease without esophagitis: Secondary | ICD-10-CM | POA: Insufficient documentation

## 2014-09-16 DIAGNOSIS — J449 Chronic obstructive pulmonary disease, unspecified: Secondary | ICD-10-CM | POA: Insufficient documentation

## 2014-09-16 DIAGNOSIS — Z5181 Encounter for therapeutic drug level monitoring: Secondary | ICD-10-CM | POA: Insufficient documentation

## 2014-09-16 DIAGNOSIS — M797 Fibromyalgia: Secondary | ICD-10-CM | POA: Diagnosis not present

## 2014-09-16 DIAGNOSIS — E669 Obesity, unspecified: Secondary | ICD-10-CM | POA: Insufficient documentation

## 2014-09-16 DIAGNOSIS — M7051 Other bursitis of knee, right knee: Secondary | ICD-10-CM | POA: Insufficient documentation

## 2014-09-16 HISTORY — DX: Sleep apnea, unspecified: G47.30

## 2014-09-16 HISTORY — PX: KNEE BURSECTOMY: SHX5882

## 2014-09-16 LAB — GLUCOSE, CAPILLARY
Glucose-Capillary: 100 mg/dL — ABNORMAL HIGH (ref 65–99)
Glucose-Capillary: 110 mg/dL — ABNORMAL HIGH (ref 65–99)

## 2014-09-16 LAB — COMPREHENSIVE METABOLIC PANEL
ALT: 49 U/L (ref 14–54)
AST: 43 U/L — ABNORMAL HIGH (ref 15–41)
Albumin: 3.9 g/dL (ref 3.5–5.0)
Alkaline Phosphatase: 80 U/L (ref 38–126)
Anion gap: 11 (ref 5–15)
BUN: 38 mg/dL — ABNORMAL HIGH (ref 6–20)
CO2: 21 mmol/L — ABNORMAL LOW (ref 22–32)
Calcium: 9 mg/dL (ref 8.9–10.3)
Chloride: 106 mmol/L (ref 101–111)
Creatinine, Ser: 1.32 mg/dL — ABNORMAL HIGH (ref 0.44–1.00)
GFR calc Af Amer: 45 mL/min — ABNORMAL LOW (ref >60)
GFR calc non Af Amer: 39 mL/min — ABNORMAL LOW (ref >60)
Glucose, Bld: 102 mg/dL — ABNORMAL HIGH (ref 65–99)
Potassium: 4.3 mmol/L (ref 3.5–5.1)
Sodium: 138 mmol/L (ref 135–145)
Total Bilirubin: 1.3 mg/dL — ABNORMAL HIGH (ref 0.3–1.2)
Total Protein: 7 g/dL (ref 6.5–8.1)

## 2014-09-16 LAB — URINALYSIS, ROUTINE W REFLEX MICROSCOPIC
Glucose, UA: NEGATIVE mg/dL
Hgb urine dipstick: NEGATIVE
Ketones, ur: NEGATIVE mg/dL
Nitrite: NEGATIVE
Protein, ur: 30 mg/dL — AB
Specific Gravity, Urine: 1.025 (ref 1.005–1.030)
Urobilinogen, UA: 0.2 mg/dL (ref 0.0–1.0)
pH: 6 (ref 5.0–8.0)

## 2014-09-16 LAB — URINE MICROSCOPIC-ADD ON

## 2014-09-16 LAB — PROTIME-INR
INR: 1.03 (ref 0.00–1.49)
Prothrombin Time: 13.7 seconds (ref 11.6–15.2)

## 2014-09-16 SURGERY — BURSECTOMY, KNEE
Anesthesia: General | Site: Knee | Laterality: Right

## 2014-09-16 MED ORDER — CHLORHEXIDINE GLUCONATE 4 % EX LIQD: 60.0000 mL | Freq: Once | CUTANEOUS | Status: DC

## 2014-09-16 MED ORDER — CEFAZOLIN SODIUM-DEXTROSE 2-3 GM-% IV SOLR
2.0000 g | INTRAVENOUS | Status: AC
  Administered 2014-09-16: 2 g via INTRAVENOUS

## 2014-09-16 MED ORDER — SODIUM CHLORIDE 0.9 % IV SOLN: INTRAVENOUS | Status: DC

## 2014-09-16 MED ORDER — LACTATED RINGERS IV SOLN: INTRAVENOUS | Status: DC

## 2014-09-16 MED ORDER — FENTANYL CITRATE (PF) 250 MCG/5ML IJ SOLN
INTRAMUSCULAR | Status: DC | PRN
  Administered 2014-09-16 (×8): 25 ug via INTRAVENOUS

## 2014-09-16 MED ORDER — PROPOFOL 10 MG/ML IV BOLUS
INTRAVENOUS | Status: AC
  Filled 2014-09-16: qty 20

## 2014-09-16 MED ORDER — ACETAMINOPHEN 10 MG/ML IV SOLN
1000.0000 mg | Freq: Once | INTRAVENOUS | Status: AC
  Administered 2014-09-16: 1000 mg via INTRAVENOUS

## 2014-09-16 MED ORDER — SODIUM CHLORIDE 0.9 % IJ SOLN
INTRAMUSCULAR | Status: AC
  Filled 2014-09-16: qty 50

## 2014-09-16 MED ORDER — CEFAZOLIN SODIUM-DEXTROSE 2-3 GM-% IV SOLR
INTRAVENOUS | Status: AC
  Filled 2014-09-16: qty 50

## 2014-09-16 MED ORDER — BUPIVACAINE HCL (PF) 0.5 % IJ SOLN
INTRAMUSCULAR | Status: AC
  Filled 2014-09-16: qty 30

## 2014-09-16 MED ORDER — 0.9 % SODIUM CHLORIDE (POUR BTL) OPTIME
TOPICAL | Status: DC | PRN
  Administered 2014-09-16: 1000 mL

## 2014-09-16 MED ORDER — BUPIVACAINE HCL (PF) 0.5 % IJ SOLN
INTRAMUSCULAR | Status: DC | PRN
  Administered 2014-09-16: 20 mL

## 2014-09-16 MED ORDER — ONDANSETRON HCL 4 MG/2ML IJ SOLN
INTRAMUSCULAR | Status: AC
  Filled 2014-09-16: qty 2

## 2014-09-16 MED ORDER — FENTANYL CITRATE (PF) 250 MCG/5ML IJ SOLN
INTRAMUSCULAR | Status: AC
  Filled 2014-09-16: qty 5

## 2014-09-16 MED ORDER — FENTANYL CITRATE (PF) 100 MCG/2ML IJ SOLN: 25.0000 ug | INTRAMUSCULAR | Status: DC | PRN

## 2014-09-16 MED ORDER — FENTANYL CITRATE (PF) 100 MCG/2ML IJ SOLN
INTRAMUSCULAR | Status: AC
  Filled 2014-09-16: qty 2

## 2014-09-16 MED ORDER — ONDANSETRON HCL 4 MG/2ML IJ SOLN
INTRAMUSCULAR | Status: DC | PRN
  Administered 2014-09-16: 4 mg via INTRAVENOUS

## 2014-09-16 MED ORDER — DEXAMETHASONE SODIUM PHOSPHATE 10 MG/ML IJ SOLN
10.0000 mg | Freq: Once | INTRAMUSCULAR | Status: AC
  Administered 2014-09-16: 10 mg via INTRAVENOUS

## 2014-09-16 MED ORDER — LACTATED RINGERS IV SOLN
INTRAVENOUS | Status: DC | PRN
  Administered 2014-09-16: 15:00:00 via INTRAVENOUS

## 2014-09-16 MED ORDER — HYDROCODONE-ACETAMINOPHEN 5-325 MG PO TABS: 1.0000 | ORAL_TABLET | ORAL | Status: DC | PRN

## 2014-09-16 MED ORDER — ACETAMINOPHEN 10 MG/ML IV SOLN
INTRAVENOUS | Status: AC
  Filled 2014-09-16: qty 100

## 2014-09-16 MED ORDER — DEXAMETHASONE SODIUM PHOSPHATE 10 MG/ML IJ SOLN
INTRAMUSCULAR | Status: AC
  Filled 2014-09-16: qty 1

## 2014-09-16 MED ORDER — PROPOFOL 10 MG/ML IV BOLUS
INTRAVENOUS | Status: DC | PRN
  Administered 2014-09-16: 50 mg via INTRAVENOUS
  Administered 2014-09-16: 30 mg via INTRAVENOUS
  Administered 2014-09-16: 150 mg via INTRAVENOUS

## 2014-09-16 MED ORDER — PROMETHAZINE HCL 25 MG/ML IJ SOLN: 6.2500 mg | INTRAMUSCULAR | Status: DC | PRN

## 2014-09-16 SURGICAL SUPPLY — 43 items
BAG SPEC THK2 15X12 ZIP CLS (MISCELLANEOUS) ×1
BAG ZIPLOCK 12X15 (MISCELLANEOUS) ×2 IMPLANT
BANDAGE ELASTIC 6 VELCRO ST LF (GAUZE/BANDAGES/DRESSINGS) ×2 IMPLANT
BNDG COHESIVE 4X5 TAN STRL (GAUZE/BANDAGES/DRESSINGS) ×2 IMPLANT
BNDG GAUZE ELAST 4 BULKY (GAUZE/BANDAGES/DRESSINGS) ×2 IMPLANT
COVER MAYO STAND STRL (DRAPES) ×1 IMPLANT
DRAPE INCISE IOBAN 66X45 STRL (DRAPES) ×2 IMPLANT
DRSG EMULSION OIL 3X16 NADH (GAUZE/BANDAGES/DRESSINGS) ×2 IMPLANT
DRSG EMULSION OIL 3X3 NADH (GAUZE/BANDAGES/DRESSINGS) ×1 IMPLANT
DRSG PAD ABDOMINAL 8X10 ST (GAUZE/BANDAGES/DRESSINGS) ×2 IMPLANT
DURAPREP 26ML APPLICATOR (WOUND CARE) ×2 IMPLANT
ELECT REM PT RETURN 9FT ADLT (ELECTROSURGICAL) ×2
ELECTRODE REM PT RTRN 9FT ADLT (ELECTROSURGICAL) ×1 IMPLANT
GAUZE SPONGE 4X4 12PLY STRL (GAUZE/BANDAGES/DRESSINGS) ×2 IMPLANT
GLOVE BIO SURGEON STRL SZ7.5 (GLOVE) ×2 IMPLANT
GLOVE BIOGEL PI IND STRL 8 (GLOVE) ×1 IMPLANT
GLOVE BIOGEL PI IND STRL 8.5 (GLOVE) ×1 IMPLANT
GLOVE BIOGEL PI INDICATOR 8 (GLOVE) ×2
GLOVE BIOGEL PI INDICATOR 8.5 (GLOVE) ×1
GOWN STRL REUS W/TWL LRG LVL3 (GOWN DISPOSABLE) ×4 IMPLANT
GOWN STRL REUS W/TWL XL LVL3 (GOWN DISPOSABLE) ×4 IMPLANT
IMMOBILIZER KNEE 20 (SOFTGOODS)
IMMOBILIZER KNEE 20 THIGH 36 (SOFTGOODS) IMPLANT
KIT BASIN OR (CUSTOM PROCEDURE TRAY) ×2 IMPLANT
MANIFOLD NEPTUNE II (INSTRUMENTS) ×2 IMPLANT
NDL SAFETY ECLIPSE 18X1.5 (NEEDLE) ×1 IMPLANT
NEEDLE HYPO 18GX1.5 SHARP (NEEDLE) ×2
NS IRRIG 1000ML POUR BTL (IV SOLUTION) ×2 IMPLANT
PACK ORTHO EXTREMITY (CUSTOM PROCEDURE TRAY) ×2 IMPLANT
PAD CAST 4YDX4 CTTN HI CHSV (CAST SUPPLIES) ×1 IMPLANT
PADDING CAST COTTON 4X4 STRL (CAST SUPPLIES) ×2
PADDING CAST COTTON 6X4 STRL (CAST SUPPLIES) ×3 IMPLANT
POSITIONER SURGICAL ARM (MISCELLANEOUS) ×2 IMPLANT
SPONGE LAP 4X18 X RAY DECT (DISPOSABLE) ×1 IMPLANT
SUT ETHILON 4 0 PS 2 18 (SUTURE) ×1 IMPLANT
SUT VIC AB 1 CT1 27 (SUTURE) ×2
SUT VIC AB 1 CT1 27XBRD ANTBC (SUTURE) ×1 IMPLANT
SUT VIC AB 2-0 CT1 27 (SUTURE) ×2
SUT VIC AB 2-0 CT1 27XBRD (SUTURE) ×1 IMPLANT
SYR 50ML LL SCALE MARK (SYRINGE) ×2 IMPLANT
TOWEL OR 17X26 10 PK STRL BLUE (TOWEL DISPOSABLE) ×4 IMPLANT
WATER STERILE IRR 1500ML POUR (IV SOLUTION) ×1 IMPLANT
WRAP KNEE MAXI GEL POST OP (GAUZE/BANDAGES/DRESSINGS) ×1 IMPLANT

## 2014-09-16 NOTE — Anesthesia Postprocedure Evaluation (Signed)
  Anesthesia Post-op Note  Patient: [name]  Procedure(s) Performed: Procedure(s) (LRB): EXCISON OF PRE PATELLA BURSA RIGHT KNEE  (Right)  Patient Location: PACU  Anesthesia Type: General  Level of Consciousness: awake and alert   Airway and Oxygen Therapy: Patient Spontanous Breathing  Post-op Pain: mild  Post-op Assessment: Post-op Vital signs reviewed, Patient's Cardiovascular Status Stable, Respiratory Function Stable, Patent Airway and No signs of Nausea or vomiting  Last Vitals:  Filed Vitals:   09/16/14 1824  BP: 122/59  Pulse: 87  Temp: 36.7 C  Resp: 16    Post-op Vital Signs: stable   Complications: No apparent anesthesia complications

## 2014-09-16 NOTE — Transfer of Care (Signed)
Immediate Anesthesia Transfer of Care Note  Patient: [name]  Procedure(s) Performed: Procedure(s): EXCISON OF PRE PATELLA BURSA RIGHT KNEE  (Right)  Patient Location: PACU  Anesthesia Type:General  Level of Consciousness: awake, alert  and patient cooperative  Airway & Oxygen Therapy: Patient Spontanous Breathing and Patient connected to face mask oxygen  Post-op Assessment: Report given to RN and Post -op Vital signs reviewed and stable  Post vital signs: Reviewed and stable  Last Vitals:  Filed Vitals:   09/16/14 1341  BP: 108/52  Pulse: 95  Temp: 36.4 C  Resp: 16    Complications: No apparent anesthesia complications

## 2014-09-16 NOTE — Anesthesia Preprocedure Evaluation (Addendum)
Anesthesia Evaluation  Patient identified by MRN, date of birth, ID band Patient awake    Reviewed: Allergy & Precautions, NPO status , Patient's Chart, lab work & pertinent test results  Airway Mallampati: II  TM Distance: >3 FB Neck ROM: Full    Dental no notable dental hx.    Pulmonary asthma , sleep apnea , COPDformer smoker,  breath sounds clear to auscultation  Pulmonary exam normal       Cardiovascular negative cardio ROS Normal cardiovascular examRhythm:Regular Rate:Normal     Neuro/Psych  Neuromuscular disease negative psych ROS   GI/Hepatic Neg liver ROS, GERD-  Medicated,  Endo/Other  diabetes, Type 2, Oral Hypoglycemic Agents  Renal/GU negative Renal ROS  negative genitourinary   Musculoskeletal  (+) Arthritis -, Rheumatoid disorders,  Fibromyalgia -  Abdominal (+) + obese,   Peds negative pediatric ROS (+)  Hematology negative hematology ROS (+)   Anesthesia Other Findings   Reproductive/Obstetrics negative OB ROS                            Anesthesia Physical Anesthesia Plan  ASA: III  Anesthesia Plan: General   Post-op Pain Management:    Induction: Intravenous  Airway Management Planned: LMA  Additional Equipment:   Intra-op Plan:   Post-operative Plan: Extubation in OR  Informed Consent: I have reviewed the patients History and Physical, chart, labs and discussed the procedure including the risks, benefits and alternatives for the proposed anesthesia with the patient or authorized representative who has indicated his/her understanding and acceptance.   Dental advisory given  Plan Discussed with: CRNA  Anesthesia Plan Comments:         Anesthesia Quick Evaluation

## 2014-09-16 NOTE — Anesthesia Procedure Notes (Signed)
Date/Time: 09/16/2014 4:53 PM Performed by: Dione Booze Pre-anesthesia Checklist: Patient identified, Emergency Drugs available, Suction available and Patient being monitored Patient Re-evaluated:Patient Re-evaluated prior to inductionOxygen Delivery Method: Circle system utilized Preoxygenation: Pre-oxygenation with 100% oxygen Intubation Type: IV induction LMA: LMA inserted LMA Size: 4.0 Number of attempts: 1 Placement Confirmation: breath sounds checked- equal and bilateral and positive ETCO2 Tube secured with: Tape Dental Injury: Teeth and Oropharynx as per pre-operative assessment

## 2014-09-16 NOTE — Discharge Instructions (Signed)
You may remove your large bandage and shower in 3 days  Resume your daily 81 mg Aspirin starting tomorrow  Use your walker to ambulate until you are comfortable without it  You may resume activities as tolerated

## 2014-09-16 NOTE — H&P (View-Only) (Signed)
CC- [name] is a 74 y.o. female who presents with right knee pain.  HPI- . Knee Pain: Patient presents with knee swelling involving the  right knee. Onset of the symptoms was several weeks ago. Inciting event: none known. Current symptoms include pain located anteriorly and swelling. Pain is aggravated by kneeling and walking.  Patient has had prior knee problems. Evaluation to date: plain films: normal. Treatment to date: aspiration of inflammed bursa with recurrent swelling.  Past Medical History  Diagnosis Date  . Asthma   . Osteoporosis   . Emphysema   . Fibromyalgia   . Diabetes mellitus without complication   . Rheumatoid arthritis(714.0)     Past Surgical History  Procedure Laterality Date  . 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 Foot surgery      numerous  . Ankle fracture surgery  2007  . Partial hysterectomy  1970    Prior to Admission medications   Medication Sig Start Date End Date Taking? Authorizing Provider  aspirin 81 MG tablet Take 81 mg by mouth 3 (three) times a week.   Yes Historical Provider, MD  atorvastatin (LIPITOR) 40 MG tablet Take 40 mg by mouth daily.  02/24/13  Yes Historical Provider, MD  cetirizine (ZYRTEC) 10 MG tablet Take 20 mg by mouth daily.   Yes Historical Provider, MD  cholecalciferol (VITAMIN D) 1000 UNITS tablet Take 1,000 Units by mouth daily.   Yes Historical Provider, MD  Coenzyme Q10 (COQ10) 200 MG CAPS Take 1 capsule by mouth daily.   Yes Historical Provider, MD  Cyanocobalamin (B-12) 2500 MCG TABS Take 1 tablet by mouth daily.   Yes Historical Provider, MD  DULoxetine (CYMBALTA) 60 MG capsule Take 60 mg by mouth daily.   Yes Historical Provider, MD  loperamide (IMODIUM) 2 MG capsule Take 4 mg by mouth as needed for diarrhea or loose stools.   Yes Historical Provider, MD  metformin (FORTAMET) 1000 MG (OSM) 24 hr tablet Take 1,000 mg by mouth daily.  03/08/14  Yes Historical Provider, MD  montelukast (SINGULAIR) 10 MG  tablet Take 10 mg by mouth daily.  09/30/13  Yes Historical Provider, MD  morphine (MSIR) 30 MG tablet Take 30 mg by mouth every 4 (four) hours as needed for severe pain.    Yes Historical Provider, MD  Multiple Vitamin (MULTIVITAMIN WITH MINERALS) TABS tablet Take 1 tablet by mouth daily.   Yes Historical Provider, MD  Omega-3 Fatty Acids (FISH OIL) 1200 MG CAPS Take 1 capsule by mouth daily.   Yes Historical Provider, MD  pantoprazole (PROTONIX) 40 MG tablet Take 40 mg by mouth daily.   Yes Historical Provider, MD  predniSONE (DELTASONE) 5 MG tablet Take 5 mg by mouth daily.  10/31/09  Yes Historical Provider, MD  ranitidine (ZANTAC) 150 MG tablet Take 300 mg by mouth daily.  09/30/13  Yes Historical Provider, MD  riTUXimab (RITUXAN) 10 MG/ML injection 1 unit Intravenously every 6 months 10/31/09  Yes Historical Provider, MD  traMADol (ULTRAM) 50 MG tablet Take 50 mg by mouth every 6 (six) hours as needed for moderate pain.  05/12/14  Yes Historical Provider, MD   KNEE EXAM antalgic gait, collateral ligaments intact, swollen pre-patellar bursa without surrounding erythema  Physical Examination: General appearance - alert, well appearing, and in no distress Mental status - alert, oriented to person, place, and time Chest - clear to auscultation, no wheezes, rales or rhonchi, symmetric air entry Heart - normal  rate, regular rhythm, normal S1, S2, no murmurs, rubs, clicks or gallops Abdomen - soft, nontender, nondistended, no masses or organomegaly Neurological - alert, oriented, normal speech, no focal findings or movement disorder noted    Asessment/Plan--- Right knee pre-patellar bursitis- - Plan right knee bursectomy. Procedure risks and potential comps discussed with patient who elects to proceed. Goals are decreased pain and increased function with a high likelihood of achieving both

## 2014-09-16 NOTE — Brief Op Note (Signed)
09/16/2014  5:35 PM  PATIENT:  [name]  74 y.o. female  PRE-OPERATIVE DIAGNOSIS:  BURSITIS RIGHT KNEE   POST-OPERATIVE DIAGNOSIS:  BURSITIS RIGHT KNEE  PROCEDURE:  Procedure(s): EXCISON OF PRE PATELLA BURSA RIGHT KNEE  (Right)  SURGEON:  Surgeon(s) and Role:    * Gaynelle Arabian, MD - Primary  PHYSICIAN ASSISTANT:   ASSISTANTS: none   ANESTHESIA:   general  EBL:  Total I/O In: 500 [I.V.:500] Out: 50 [Blood:50]  BLOOD ADMINISTERED:none  DRAINS: none   LOCAL MEDICATIONS USED:  MARCAINE     COUNTS:  YES  TOURNIQUET:    DICTATION: .Other Dictation: Dictation Number [id number]  PLAN OF CARE: Discharge to home after PACU  PATIENT DISPOSITION:  PACU - hemodynamically stable.

## 2014-09-16 NOTE — Progress Notes (Signed)
Patient has felt symptomatic w UTI x 2 weeks. Saw primary care MD w/o being given any treament because " was already on an antibiotic because of knee"

## 2014-09-16 NOTE — Interval H&P Note (Signed)
History and Physical Interval Note:  09/16/2014 4:39 PM  [name]  has presented today for surgery, with the diagnosis of BURSITIS RIGHT KNEE   The various methods of treatment have been discussed with the patient and family. After consideration of risks, benefits and other options for treatment, the patient has consented to  Procedure(s): Sonoita  (Right) as a surgical intervention .  The patient's history has been reviewed, patient examined, no change in status, stable for surgery.  I have reviewed the patient's chart and labs.  Questions were answered to the patient's satisfaction.     [name]

## 2014-09-17 NOTE — Op Note (Signed)
NAMECHESNIE, CAPELL NO.:  [id number]  MEDICAL RECORD NO.:  [id number]  LOCATION:  [facility]                         FACILITY:  [facility]  PHYSICIAN:  Gaynelle Arabian, M.D.    DATE OF BIRTH:  1940-10-12  DATE OF PROCEDURE:  09/16/2014 DATE OF DISCHARGE:  09/16/2014                              OPERATIVE REPORT   PREOPERATIVE DIAGNOSIS:  Prepatellar bursitis, right knee.  POSTOPERATIVE DIAGNOSIS:  Prepatellar bursitis, right knee.  PROCEDURE:  Excision of prepatellar bursa, right knee.  SURGEON:  Gaynelle Arabian, M.D.  ASSISTANT:  No assistant.  ANESTHESIA:  General.  ESTIMATED BLOOD LOSS:  Minimal.  DRAINS:  None.  COMPLICATIONS:  None.  CONDITION:  Stable to recovery.  BRIEF CLINICAL NOTE:  Ms. Suttles is a 74 year old female, long complex history regard to her right knee.  She recently presented with prepatellar bursitis.  Initially, it appeared infected.  We tried courses of doxycycline which did not help and Keflex which helped some. She still has swelling in the prepatellar bursa and requests excision of the bursa at this time.  PROCEDURE IN DETAIL:  After successful administration of general anesthetic, tourniquet was placed high on the right thigh.  Right lower extremity was prepped and draped in usual sterile fashion.  I did not utilize a tourniquet for the procedure.  A 10 blade was used to cut through the skin with a vertical incision, centered over the bursa. Skin was cut with a 10 blade to the subcutaneous tissue and then Metzenbaum scissors were used to create medial and lateral flaps.  The bursal tissue was identified and excised.  Of note, she has had a previous right total knee arthroplasty and a previous patellar fracture with exposure of the superior third of the polyethylene.  The edge of the polyethylene had worn through the tissue and was present.  I was able to reduce this.  I thoroughly irrigated with saline solution. After reduce, I was  able to close the tissue back primarily over this so with no longer apply pressure to the subcutaneous tissue and skin.  I closed this with #1 Vicryl and then flexed the knee and it remained intact.  Minor bleeding was then stopped with electrocautery.  I injected a total of 20 mL of 0.5% Marcaine into the subcutaneous tissues.  The wound was then copiously irrigated with saline solution. Subcu tissues closed with interrupted 2-0 Vicryl.  Skin was closed with a 4-0 nylon with a running baseball-type stitch.  Incisions were cleaned and dried and a bulky sterile dressing applied.  She was awakened and transported to the recovery in stable condition.     Gaynelle Arabian, M.D.     FA/MEDQ  D:  09/16/2014  T:  09/17/2014  Job:  [id number]

## 2014-09-19 ENCOUNTER — Encounter (HOSPITAL_COMMUNITY): Payer: Self-pay | Admitting: Orthopedic Surgery

## 2014-09-27 DIAGNOSIS — M7051 Other bursitis of knee, right knee: Secondary | ICD-10-CM | POA: Diagnosis not present

## 2014-09-27 DIAGNOSIS — Z48817 Encounter for surgical aftercare following surgery on the skin and subcutaneous tissue: Secondary | ICD-10-CM | POA: Diagnosis not present

## 2014-10-05 DIAGNOSIS — R609 Edema, unspecified: Secondary | ICD-10-CM | POA: Diagnosis not present

## 2014-10-05 DIAGNOSIS — L0390 Cellulitis, unspecified: Secondary | ICD-10-CM | POA: Diagnosis not present

## 2014-10-05 DIAGNOSIS — T8130XA Disruption of wound, unspecified, initial encounter: Secondary | ICD-10-CM | POA: Diagnosis not present

## 2014-10-06 DIAGNOSIS — Z48817 Encounter for surgical aftercare following surgery on the skin and subcutaneous tissue: Secondary | ICD-10-CM | POA: Diagnosis not present

## 2014-10-07 DIAGNOSIS — R300 Dysuria: Secondary | ICD-10-CM | POA: Diagnosis not present

## 2014-10-07 DIAGNOSIS — Z48817 Encounter for surgical aftercare following surgery on the skin and subcutaneous tissue: Secondary | ICD-10-CM | POA: Diagnosis not present

## 2014-10-10 ENCOUNTER — Encounter: Payer: Self-pay | Admitting: Podiatry

## 2014-10-10 ENCOUNTER — Ambulatory Visit (INDEPENDENT_AMBULATORY_CARE_PROVIDER_SITE_OTHER): Payer: Medicare Other | Admitting: Podiatry

## 2014-10-10 DIAGNOSIS — B351 Tinea unguium: Secondary | ICD-10-CM

## 2014-10-10 DIAGNOSIS — M79676 Pain in unspecified toe(s): Secondary | ICD-10-CM

## 2014-10-10 NOTE — Progress Notes (Signed)
Subjective:     Patient ID: [name], female   DOB: 1940/04/18, 74 y.o.   MRN: [id number]  HPI This patient presents to the office for preventive foot care services.  She says she has history of DM, RA and knee infection right knee.  Her toes are contracted causing pain to occur due to her toe contractures.  She also has painful callus under right big toe due to toe position.   She has severe DJD both feet.     Review of Systems     Objective:   Physical Exam GENERAL APPEARANCE: Alert, conversant. Appropriately groomed. No acute distress.  VASCULAR: Pedal pulses palpable at 2/4 DP and PT bilateral.  Capillary refill time is immediate to all digits,  Proximal to distal cooling it warm to warm.  Digital hair growth is present bilateral  NEUROLOGIC: sensation is intact epicritically and protectively to 5.07 monofilament at 5/5 sites bilateral.  Light touch is intact bilateral, vibratory sensation intact bilateral, achilles tendon reflex is intact bilateral.  MUSCULOSKELETAL:  Charcot foot changes both feet.  Deviation noted at the 1-5 MPJ B/L.  Toes are medially deviated at MPJ.  Fixed plantarflexory position of proximal phalanx right foot.  DERMATOLOGIC: skin color, texture, and turgor are within normal limits.  No preulcerative lesions or ulcers  are seen, no interdigital maceration noted.  No open lesions present.  Callus under right hallux. NAILS  Thick disfigured nails 1-5 B/L.        Assessment:     Onychomycosis  ,RA ,DM     Plan:     Debridement and grinding of long thick nails

## 2014-10-11 DIAGNOSIS — M7051 Other bursitis of knee, right knee: Secondary | ICD-10-CM | POA: Diagnosis not present

## 2014-10-11 DIAGNOSIS — Z48817 Encounter for surgical aftercare following surgery on the skin and subcutaneous tissue: Secondary | ICD-10-CM | POA: Diagnosis not present

## 2014-10-11 DIAGNOSIS — Z4789 Encounter for other orthopedic aftercare: Secondary | ICD-10-CM | POA: Diagnosis not present

## 2014-10-18 DIAGNOSIS — Z48817 Encounter for surgical aftercare following surgery on the skin and subcutaneous tissue: Secondary | ICD-10-CM | POA: Diagnosis not present

## 2014-10-19 DIAGNOSIS — T814XXD Infection following a procedure, subsequent encounter: Secondary | ICD-10-CM | POA: Diagnosis not present

## 2014-10-19 DIAGNOSIS — E119 Type 2 diabetes mellitus without complications: Secondary | ICD-10-CM | POA: Diagnosis not present

## 2014-10-19 DIAGNOSIS — M797 Fibromyalgia: Secondary | ICD-10-CM | POA: Diagnosis not present

## 2014-10-19 DIAGNOSIS — M069 Rheumatoid arthritis, unspecified: Secondary | ICD-10-CM | POA: Diagnosis not present

## 2014-10-19 DIAGNOSIS — J439 Emphysema, unspecified: Secondary | ICD-10-CM | POA: Diagnosis not present

## 2014-10-19 DIAGNOSIS — M7041 Prepatellar bursitis, right knee: Secondary | ICD-10-CM | POA: Diagnosis not present

## 2014-10-20 DIAGNOSIS — E119 Type 2 diabetes mellitus without complications: Secondary | ICD-10-CM | POA: Diagnosis not present

## 2014-10-20 DIAGNOSIS — T814XXD Infection following a procedure, subsequent encounter: Secondary | ICD-10-CM | POA: Diagnosis not present

## 2014-10-20 DIAGNOSIS — J439 Emphysema, unspecified: Secondary | ICD-10-CM | POA: Diagnosis not present

## 2014-10-20 DIAGNOSIS — M797 Fibromyalgia: Secondary | ICD-10-CM | POA: Diagnosis not present

## 2014-10-20 DIAGNOSIS — T8189XA Other complications of procedures, not elsewhere classified, initial encounter: Secondary | ICD-10-CM | POA: Diagnosis not present

## 2014-10-20 DIAGNOSIS — M069 Rheumatoid arthritis, unspecified: Secondary | ICD-10-CM | POA: Diagnosis not present

## 2014-10-20 DIAGNOSIS — M7041 Prepatellar bursitis, right knee: Secondary | ICD-10-CM | POA: Diagnosis not present

## 2014-10-21 DIAGNOSIS — E119 Type 2 diabetes mellitus without complications: Secondary | ICD-10-CM | POA: Diagnosis not present

## 2014-10-21 DIAGNOSIS — J439 Emphysema, unspecified: Secondary | ICD-10-CM | POA: Diagnosis not present

## 2014-10-21 DIAGNOSIS — M7041 Prepatellar bursitis, right knee: Secondary | ICD-10-CM | POA: Diagnosis not present

## 2014-10-21 DIAGNOSIS — M797 Fibromyalgia: Secondary | ICD-10-CM | POA: Diagnosis not present

## 2014-10-21 DIAGNOSIS — M069 Rheumatoid arthritis, unspecified: Secondary | ICD-10-CM | POA: Diagnosis not present

## 2014-10-21 DIAGNOSIS — T814XXD Infection following a procedure, subsequent encounter: Secondary | ICD-10-CM | POA: Diagnosis not present

## 2014-10-22 DIAGNOSIS — M7041 Prepatellar bursitis, right knee: Secondary | ICD-10-CM | POA: Diagnosis not present

## 2014-10-22 DIAGNOSIS — T814XXD Infection following a procedure, subsequent encounter: Secondary | ICD-10-CM | POA: Diagnosis not present

## 2014-10-22 DIAGNOSIS — E119 Type 2 diabetes mellitus without complications: Secondary | ICD-10-CM | POA: Diagnosis not present

## 2014-10-22 DIAGNOSIS — M069 Rheumatoid arthritis, unspecified: Secondary | ICD-10-CM | POA: Diagnosis not present

## 2014-10-22 DIAGNOSIS — J439 Emphysema, unspecified: Secondary | ICD-10-CM | POA: Diagnosis not present

## 2014-10-22 DIAGNOSIS — M797 Fibromyalgia: Secondary | ICD-10-CM | POA: Diagnosis not present

## 2014-10-23 DIAGNOSIS — T814XXD Infection following a procedure, subsequent encounter: Secondary | ICD-10-CM | POA: Diagnosis not present

## 2014-10-23 DIAGNOSIS — M797 Fibromyalgia: Secondary | ICD-10-CM | POA: Diagnosis not present

## 2014-10-23 DIAGNOSIS — M069 Rheumatoid arthritis, unspecified: Secondary | ICD-10-CM | POA: Diagnosis not present

## 2014-10-23 DIAGNOSIS — J439 Emphysema, unspecified: Secondary | ICD-10-CM | POA: Diagnosis not present

## 2014-10-23 DIAGNOSIS — M7041 Prepatellar bursitis, right knee: Secondary | ICD-10-CM | POA: Diagnosis not present

## 2014-10-23 DIAGNOSIS — E119 Type 2 diabetes mellitus without complications: Secondary | ICD-10-CM | POA: Diagnosis not present

## 2014-10-24 DIAGNOSIS — M069 Rheumatoid arthritis, unspecified: Secondary | ICD-10-CM | POA: Diagnosis not present

## 2014-10-24 DIAGNOSIS — M7041 Prepatellar bursitis, right knee: Secondary | ICD-10-CM | POA: Diagnosis not present

## 2014-10-24 DIAGNOSIS — E119 Type 2 diabetes mellitus without complications: Secondary | ICD-10-CM | POA: Diagnosis not present

## 2014-10-24 DIAGNOSIS — M797 Fibromyalgia: Secondary | ICD-10-CM | POA: Diagnosis not present

## 2014-10-24 DIAGNOSIS — J439 Emphysema, unspecified: Secondary | ICD-10-CM | POA: Diagnosis not present

## 2014-10-24 DIAGNOSIS — T814XXD Infection following a procedure, subsequent encounter: Secondary | ICD-10-CM | POA: Diagnosis not present

## 2014-10-25 ENCOUNTER — Other Ambulatory Visit (HOSPITAL_COMMUNITY): Payer: Self-pay | Admitting: Anesthesiology

## 2014-10-25 ENCOUNTER — Encounter (HOSPITAL_COMMUNITY): Payer: Self-pay | Admitting: *Deleted

## 2014-10-25 ENCOUNTER — Ambulatory Visit: Payer: Self-pay | Admitting: Orthopedic Surgery

## 2014-10-25 DIAGNOSIS — Z48817 Encounter for surgical aftercare following surgery on the skin and subcutaneous tissue: Secondary | ICD-10-CM | POA: Diagnosis not present

## 2014-10-25 DIAGNOSIS — M7051 Other bursitis of knee, right knee: Secondary | ICD-10-CM | POA: Diagnosis not present

## 2014-10-26 ENCOUNTER — Encounter (HOSPITAL_COMMUNITY)
Admission: EM | Disposition: A | Discharge status: Home / Self Care | Payer: Self-pay | Source: Ambulatory Visit | Attending: Orthopedic Surgery

## 2014-10-26 ENCOUNTER — Ambulatory Visit (HOSPITAL_COMMUNITY): Payer: Medicare Other | Admitting: Anesthesiology

## 2014-10-26 ENCOUNTER — Encounter (HOSPITAL_COMMUNITY): Payer: Self-pay | Admitting: *Deleted

## 2014-10-26 ENCOUNTER — Observation Stay (HOSPITAL_COMMUNITY)
Admission: EM | Admit: 2014-10-26 | Discharge: 2014-10-28 | Disposition: A | Discharge status: Home / Self Care | Payer: Medicare Other | Source: Ambulatory Visit | Attending: Orthopedic Surgery | Admitting: Orthopedic Surgery

## 2014-10-26 DIAGNOSIS — J45909 Unspecified asthma, uncomplicated: Secondary | ICD-10-CM | POA: Insufficient documentation

## 2014-10-26 DIAGNOSIS — E119 Type 2 diabetes mellitus without complications: Secondary | ICD-10-CM | POA: Diagnosis not present

## 2014-10-26 DIAGNOSIS — J439 Emphysema, unspecified: Secondary | ICD-10-CM | POA: Insufficient documentation

## 2014-10-26 DIAGNOSIS — M810 Age-related osteoporosis without current pathological fracture: Secondary | ICD-10-CM | POA: Insufficient documentation

## 2014-10-26 DIAGNOSIS — M71161 Other infective bursitis, right knee: Principal | ICD-10-CM | POA: Insufficient documentation

## 2014-10-26 DIAGNOSIS — T814XXA Infection following a procedure, initial encounter: Secondary | ICD-10-CM | POA: Diagnosis not present

## 2014-10-26 DIAGNOSIS — M797 Fibromyalgia: Secondary | ICD-10-CM | POA: Diagnosis not present

## 2014-10-26 DIAGNOSIS — M069 Rheumatoid arthritis, unspecified: Secondary | ICD-10-CM | POA: Diagnosis not present

## 2014-10-26 DIAGNOSIS — Z7982 Long term (current) use of aspirin: Secondary | ICD-10-CM | POA: Insufficient documentation

## 2014-10-26 DIAGNOSIS — G4730 Sleep apnea, unspecified: Secondary | ICD-10-CM | POA: Insufficient documentation

## 2014-10-26 HISTORY — DX: Adverse effect of unspecified anesthetic, initial encounter: T41.45XA

## 2014-10-26 HISTORY — PX: I & D EXTREMITY: SHX5045

## 2014-10-26 HISTORY — DX: Other complications of anesthesia, initial encounter: T88.59XA

## 2014-10-26 LAB — BASIC METABOLIC PANEL
Anion gap: 9 (ref 5–15)
BUN: 29 mg/dL — AB (ref 6–20)
CHLORIDE: 107 mmol/L (ref 101–111)
CO2: 24 mmol/L (ref 22–32)
Calcium: 9.3 mg/dL (ref 8.9–10.3)
Creatinine, Ser: 0.93 mg/dL (ref 0.44–1.00)
GFR calc Af Amer: >60 mL/min (ref >60)
GFR, EST NON AFRICAN AMERICAN: 60 mL/min — AB (ref >60)
GLUCOSE: 95 mg/dL (ref 65–99)
POTASSIUM: 4.5 mmol/L (ref 3.5–5.1)
Sodium: 140 mmol/L (ref 135–145)

## 2014-10-26 LAB — CBC
HEMATOCRIT: 38.4 % (ref 36.0–46.0)
HEMOGLOBIN: 12 g/dL (ref 12.0–15.0)
MCH: 26.8 pg (ref 26.0–34.0)
MCHC: 31.3 g/dL (ref 30.0–36.0)
MCV: 85.9 fL (ref 78.0–100.0)
Platelets: 296 10*3/uL (ref 150–400)
RBC: 4.47 MIL/uL (ref 3.87–5.11)
RDW: 14.8 % (ref 11.5–15.5)
WBC: 9.4 10*3/uL (ref 4.0–10.5)

## 2014-10-26 LAB — GLUCOSE, CAPILLARY
GLUCOSE-CAPILLARY: 93 mg/dL (ref 65–99)
Glucose-Capillary: 121 mg/dL — ABNORMAL HIGH (ref 65–99)

## 2014-10-26 SURGERY — IRRIGATION AND DEBRIDEMENT EXTREMITY
Anesthesia: General | Site: Knee | Laterality: Right

## 2014-10-26 MED ORDER — LACTATED RINGERS IV SOLN
INTRAVENOUS | Status: DC
  Administered 2014-10-26: 16:00:00 via INTRAVENOUS

## 2014-10-26 MED ORDER — ONDANSETRON HCL 4 MG/2ML IJ SOLN: 4.0000 mg | Freq: Four times a day (QID) | INTRAMUSCULAR | Status: DC | PRN

## 2014-10-26 MED ORDER — LORATADINE 10 MG PO TABS
10.0000 mg | ORAL_TABLET | Freq: Every day | ORAL | Status: DC
  Administered 2014-10-27 – 2014-10-28 (×2): 10 mg via ORAL
  Filled 2014-10-26 (×2): qty 1

## 2014-10-26 MED ORDER — DEXAMETHASONE SODIUM PHOSPHATE 10 MG/ML IJ SOLN
INTRAMUSCULAR | Status: AC
  Filled 2014-10-26: qty 1

## 2014-10-26 MED ORDER — ACETAMINOPHEN 10 MG/ML IV SOLN
INTRAVENOUS | Status: AC
  Filled 2014-10-26: qty 100

## 2014-10-26 MED ORDER — FENTANYL CITRATE (PF) 100 MCG/2ML IJ SOLN
INTRAMUSCULAR | Status: DC | PRN
  Administered 2014-10-26 (×4): 50 ug via INTRAVENOUS

## 2014-10-26 MED ORDER — MONTELUKAST SODIUM 10 MG PO TABS
10.0000 mg | ORAL_TABLET | Freq: Every day | ORAL | Status: DC
  Administered 2014-10-27 – 2014-10-28 (×2): 10 mg via ORAL
  Filled 2014-10-26 (×2): qty 1

## 2014-10-26 MED ORDER — VANCOMYCIN HCL IN DEXTROSE 1-5 GM/200ML-% IV SOLN
1000.0000 mg | Freq: Two times a day (BID) | INTRAVENOUS | Status: AC
  Administered 2014-10-27: 1000 mg via INTRAVENOUS
  Filled 2014-10-26: qty 200

## 2014-10-26 MED ORDER — FENTANYL CITRATE (PF) 100 MCG/2ML IJ SOLN
INTRAMUSCULAR | Status: AC
  Filled 2014-10-26: qty 4

## 2014-10-26 MED ORDER — CHLORHEXIDINE GLUCONATE 4 % EX LIQD: 60.0000 mL | Freq: Once | CUTANEOUS | Status: DC

## 2014-10-26 MED ORDER — HYDROMORPHONE HCL 1 MG/ML IJ SOLN
INTRAMUSCULAR | Status: AC
  Administered 2014-10-26: 0.5 mg via INTRAVENOUS
  Filled 2014-10-26: qty 1

## 2014-10-26 MED ORDER — OXYCODONE HCL 5 MG PO TABS
ORAL_TABLET | ORAL | Status: AC
  Filled 2014-10-26: qty 1

## 2014-10-26 MED ORDER — HYDROMORPHONE HCL 1 MG/ML IJ SOLN
0.2500 mg | INTRAMUSCULAR | Status: DC | PRN
  Administered 2014-10-26 (×2): 0.5 mg via INTRAVENOUS
  Administered 2014-10-26 (×4): 0.25 mg via INTRAVENOUS
  Administered 2014-10-26: 0.5 mg via INTRAVENOUS

## 2014-10-26 MED ORDER — LOPERAMIDE HCL 2 MG PO CAPS: 4.0000 mg | ORAL_CAPSULE | ORAL | Status: DC | PRN

## 2014-10-26 MED ORDER — ACETAMINOPHEN 650 MG RE SUPP: 650.0000 mg | Freq: Four times a day (QID) | RECTAL | Status: DC | PRN

## 2014-10-26 MED ORDER — ENOXAPARIN SODIUM 40 MG/0.4ML ~~LOC~~ SOLN
40.0000 mg | SUBCUTANEOUS | Status: DC
  Administered 2014-10-27 – 2014-10-28 (×2): 40 mg via SUBCUTANEOUS
  Filled 2014-10-26 (×3): qty 0.4

## 2014-10-26 MED ORDER — MORPHINE SULFATE (PF) 2 MG/ML IV SOLN
1.0000 mg | INTRAVENOUS | Status: DC | PRN
  Administered 2014-10-27: 1 mg via INTRAVENOUS
  Filled 2014-10-26: qty 1

## 2014-10-26 MED ORDER — DEXAMETHASONE SODIUM PHOSPHATE 10 MG/ML IJ SOLN
10.0000 mg | Freq: Once | INTRAMUSCULAR | Status: AC
  Administered 2014-10-26: 10 mg via INTRAVENOUS

## 2014-10-26 MED ORDER — HYDROMORPHONE HCL 1 MG/ML IJ SOLN: 0.2500 mg | INTRAMUSCULAR | Status: DC | PRN

## 2014-10-26 MED ORDER — ONDANSETRON HCL 4 MG PO TABS: 4.0000 mg | ORAL_TABLET | Freq: Four times a day (QID) | ORAL | Status: DC | PRN

## 2014-10-26 MED ORDER — FAMOTIDINE 40 MG PO TABS
40.0000 mg | ORAL_TABLET | Freq: Every day | ORAL | Status: DC
  Administered 2014-10-27 – 2014-10-28 (×2): 40 mg via ORAL
  Filled 2014-10-26 (×2): qty 1

## 2014-10-26 MED ORDER — DIPHENHYDRAMINE HCL 50 MG/ML IJ SOLN
INTRAMUSCULAR | Status: AC
  Administered 2014-10-26: 12.5 mg via INTRAVENOUS
  Filled 2014-10-26: qty 1

## 2014-10-26 MED ORDER — METOCLOPRAMIDE HCL 5 MG/ML IJ SOLN
5.0000 mg | Freq: Three times a day (TID) | INTRAMUSCULAR | Status: DC | PRN
Start: 2014-10-26 — End: 2014-10-28

## 2014-10-26 MED ORDER — SODIUM CHLORIDE 0.9 % IV SOLN
INTRAVENOUS | Status: DC
  Administered 2014-10-26: 21:00:00 via INTRAVENOUS

## 2014-10-26 MED ORDER — ACETAMINOPHEN 10 MG/ML IV SOLN
1000.0000 mg | Freq: Once | INTRAVENOUS | Status: AC
  Administered 2014-10-26: 1000 mg via INTRAVENOUS

## 2014-10-26 MED ORDER — PROPOFOL 10 MG/ML IV BOLUS
INTRAVENOUS | Status: DC | PRN
  Administered 2014-10-26: 200 mg via INTRAVENOUS

## 2014-10-26 MED ORDER — PROMETHAZINE HCL 25 MG/ML IJ SOLN: 6.2500 mg | INTRAMUSCULAR | Status: DC | PRN

## 2014-10-26 MED ORDER — PREDNISONE 5 MG PO TABS
5.0000 mg | ORAL_TABLET | Freq: Every day | ORAL | Status: DC
Start: 2014-10-27 — End: 2014-10-28
  Administered 2014-10-27 – 2014-10-28 (×2): 5 mg via ORAL
  Filled 2014-10-26 (×3): qty 1

## 2014-10-26 MED ORDER — PANTOPRAZOLE SODIUM 40 MG PO TBEC
40.0000 mg | DELAYED_RELEASE_TABLET | Freq: Every day | ORAL | Status: DC
  Administered 2014-10-27 – 2014-10-28 (×2): 40 mg via ORAL
  Filled 2014-10-26 (×2): qty 1

## 2014-10-26 MED ORDER — ACETAMINOPHEN 325 MG PO TABS: 650.0000 mg | ORAL_TABLET | Freq: Four times a day (QID) | ORAL | Status: DC | PRN

## 2014-10-26 MED ORDER — SODIUM CHLORIDE 0.9 % IV SOLN: INTRAVENOUS | Status: DC

## 2014-10-26 MED ORDER — METHOCARBAMOL 1000 MG/10ML IJ SOLN
500.0000 mg | Freq: Four times a day (QID) | INTRAMUSCULAR | Status: DC | PRN
  Administered 2014-10-26: 500 mg via INTRAVENOUS
  Filled 2014-10-26 (×2): qty 5

## 2014-10-26 MED ORDER — OXYCODONE HCL 5 MG PO TABS
5.0000 mg | ORAL_TABLET | ORAL | Status: DC | PRN
  Administered 2014-10-26 – 2014-10-27 (×2): 5 mg via ORAL
  Administered 2014-10-27 (×3): 10 mg via ORAL
  Filled 2014-10-26 (×2): qty 2
  Filled 2014-10-26: qty 1
  Filled 2014-10-26: qty 2

## 2014-10-26 MED ORDER — HYDROMORPHONE HCL 1 MG/ML IJ SOLN
INTRAMUSCULAR | Status: AC
  Filled 2014-10-26: qty 1

## 2014-10-26 MED ORDER — ASPIRIN 81 MG PO CHEW
81.0000 mg | CHEWABLE_TABLET | ORAL | Status: DC
  Administered 2014-10-28: 81 mg via ORAL
  Filled 2014-10-26: qty 1

## 2014-10-26 MED ORDER — INSULIN ASPART 100 UNIT/ML ~~LOC~~ SOLN
0.0000 [IU] | Freq: Three times a day (TID) | SUBCUTANEOUS | Status: DC
Start: 2014-10-27 — End: 2014-10-28
  Administered 2014-10-27: 2 [IU] via SUBCUTANEOUS
  Administered 2014-10-27: 3 [IU] via SUBCUTANEOUS
  Administered 2014-10-27: 2 [IU] via SUBCUTANEOUS

## 2014-10-26 MED ORDER — PROPOFOL 10 MG/ML IV BOLUS
INTRAVENOUS | Status: AC
  Filled 2014-10-26: qty 20

## 2014-10-26 MED ORDER — ONDANSETRON HCL 4 MG/2ML IJ SOLN
INTRAMUSCULAR | Status: AC
  Filled 2014-10-26: qty 2

## 2014-10-26 MED ORDER — FENTANYL CITRATE (PF) 100 MCG/2ML IJ SOLN: 25.0000 ug | INTRAMUSCULAR | Status: DC | PRN

## 2014-10-26 MED ORDER — METOCLOPRAMIDE HCL 10 MG PO TABS: 5.0000 mg | ORAL_TABLET | Freq: Three times a day (TID) | ORAL | Status: DC | PRN

## 2014-10-26 MED ORDER — MORPHINE SULFATE 15 MG PO TABS
30.0000 mg | ORAL_TABLET | Freq: Two times a day (BID) | ORAL | Status: DC | PRN
  Administered 2014-10-26 – 2014-10-28 (×3): 30 mg via ORAL
  Filled 2014-10-26 (×3): qty 2

## 2014-10-26 MED ORDER — DIPHENHYDRAMINE HCL 50 MG/ML IJ SOLN
12.5000 mg | Freq: Once | INTRAMUSCULAR | Status: AC
  Administered 2014-10-26: 12.5 mg via INTRAVENOUS

## 2014-10-26 MED ORDER — ATORVASTATIN CALCIUM 40 MG PO TABS
40.0000 mg | ORAL_TABLET | Freq: Every day | ORAL | Status: DC
  Administered 2014-10-27 – 2014-10-28 (×2): 40 mg via ORAL
  Filled 2014-10-26 (×2): qty 1

## 2014-10-26 MED ORDER — DULOXETINE HCL 60 MG PO CPEP
60.0000 mg | ORAL_CAPSULE | Freq: Every day | ORAL | Status: DC
  Administered 2014-10-26 – 2014-10-27 (×2): 60 mg via ORAL
  Filled 2014-10-26 (×3): qty 1

## 2014-10-26 MED ORDER — HYDROMORPHONE HCL 1 MG/ML IJ SOLN
INTRAMUSCULAR | Status: AC
  Administered 2014-10-26: 0.25 mg via INTRAVENOUS
  Filled 2014-10-26: qty 1

## 2014-10-26 MED ORDER — KETOROLAC TROMETHAMINE 30 MG/ML IJ SOLN: 30.0000 mg | Freq: Once | INTRAMUSCULAR | Status: DC

## 2014-10-26 MED ORDER — VANCOMYCIN HCL IN DEXTROSE 1-5 GM/200ML-% IV SOLN
1000.0000 mg | INTRAVENOUS | Status: AC
  Administered 2014-10-26: 1000 mg via INTRAVENOUS
  Filled 2014-10-26: qty 200

## 2014-10-26 MED ORDER — ONDANSETRON HCL 4 MG/2ML IJ SOLN
INTRAMUSCULAR | Status: DC | PRN
  Administered 2014-10-26: 4 mg via INTRAVENOUS

## 2014-10-26 MED ORDER — METHOCARBAMOL 500 MG PO TABS
500.0000 mg | ORAL_TABLET | Freq: Four times a day (QID) | ORAL | Status: DC | PRN
  Administered 2014-10-27 – 2014-10-28 (×4): 500 mg via ORAL
  Filled 2014-10-26 (×4): qty 1

## 2014-10-26 SURGICAL SUPPLY — 45 items
BAG SPEC THK2 15X12 ZIP CLS (MISCELLANEOUS) ×1
BAG ZIPLOCK 12X15 (MISCELLANEOUS) ×2 IMPLANT
BANDAGE ELASTIC 6 VELCRO ST LF (GAUZE/BANDAGES/DRESSINGS) ×2 IMPLANT
BANDAGE ESMARK 6X9 LF (GAUZE/BANDAGES/DRESSINGS) ×1 IMPLANT
BNDG CMPR 9X6 STRL LF SNTH (GAUZE/BANDAGES/DRESSINGS) ×1
BNDG ESMARK 6X9 LF (GAUZE/BANDAGES/DRESSINGS) ×2
CUFF TOURN SGL QUICK 34 (TOURNIQUET CUFF) ×2
CUFF TRNQT CYL 34X4X40X1 (TOURNIQUET CUFF) ×1 IMPLANT
DRAPE EXTREMITY T 121X128X90 (DRAPE) ×2 IMPLANT
DRAPE POUCH INSTRU U-SHP 10X18 (DRAPES) ×2 IMPLANT
DRAPE U-SHAPE 47X51 STRL (DRAPES) ×2 IMPLANT
DRSG ADAPTIC 3X8 NADH LF (GAUZE/BANDAGES/DRESSINGS) ×2 IMPLANT
DRSG PAD ABDOMINAL 8X10 ST (GAUZE/BANDAGES/DRESSINGS) ×2 IMPLANT
DURAPREP 26ML APPLICATOR (WOUND CARE) ×2 IMPLANT
ELECT REM PT RETURN 9FT ADLT (ELECTROSURGICAL) ×2
ELECTRODE REM PT RTRN 9FT ADLT (ELECTROSURGICAL) ×1 IMPLANT
EVACUATOR 1/8 PVC DRAIN (DRAIN) ×2 IMPLANT
GAUZE SPONGE 4X4 12PLY STRL (GAUZE/BANDAGES/DRESSINGS) ×2 IMPLANT
GLOVE BIO SURGEON STRL SZ7.5 (GLOVE) ×2 IMPLANT
GLOVE BIO SURGEON STRL SZ8 (GLOVE) ×4 IMPLANT
GLOVE BIOGEL PI IND STRL 8 (GLOVE) ×2 IMPLANT
GLOVE BIOGEL PI INDICATOR 8 (GLOVE) ×2
GLOVE ECLIPSE 8.0 STRL XLNG CF (GLOVE) IMPLANT
GOWN STRL REUS W/TWL LRG LVL3 (GOWN DISPOSABLE) ×2 IMPLANT
GOWN STRL REUS W/TWL XL LVL3 (GOWN DISPOSABLE) ×4 IMPLANT
HANDPIECE INTERPULSE COAX TIP (DISPOSABLE) ×2
IMMOBILIZER KNEE 20 (SOFTGOODS) ×1 IMPLANT
KIT BASIN OR (CUSTOM PROCEDURE TRAY) ×2 IMPLANT
MANIFOLD NEPTUNE II (INSTRUMENTS) ×2 IMPLANT
PACK TOTAL JOINT (CUSTOM PROCEDURE TRAY) ×2 IMPLANT
PADDING CAST COTTON 6X4 STRL (CAST SUPPLIES) ×3 IMPLANT
POSITIONER SURGICAL ARM (MISCELLANEOUS) ×2 IMPLANT
SET HNDPC FAN SPRY TIP SCT (DISPOSABLE) ×1 IMPLANT
STAPLER VISISTAT 35W (STAPLE) ×2 IMPLANT
STRIP CLOSURE SKIN 1/2X4 (GAUZE/BANDAGES/DRESSINGS) ×4 IMPLANT
SUT ETHIBOND NAB CT1 #1 30IN (SUTURE) ×2 IMPLANT
SUT MNCRL AB 4-0 PS2 18 (SUTURE) ×2 IMPLANT
SUT VIC AB 1 CT1 36 (SUTURE) ×1 IMPLANT
SUT VIC AB 2-0 CT1 27 (SUTURE) ×6
SUT VIC AB 2-0 CT1 TAPERPNT 27 (SUTURE) ×3 IMPLANT
SUT VLOC 180 0 24IN GS25 (SUTURE) ×1 IMPLANT
SWAB COLLECTION DEVICE MRSA (MISCELLANEOUS) ×2 IMPLANT
TOWEL OR 17X26 10 PK STRL BLUE (TOWEL DISPOSABLE) ×4 IMPLANT
TUBE ANAEROBIC SPECIMEN COL (MISCELLANEOUS) ×2 IMPLANT
WRAP KNEE MAXI GEL POST OP (GAUZE/BANDAGES/DRESSINGS) ×1 IMPLANT

## 2014-10-26 NOTE — Anesthesia Postprocedure Evaluation (Signed)
  Anesthesia Post-op Note  Patient: [name]  Procedure(s) Performed: Procedure(s) (LRB): IRRIGATION AND DEBRIDEMENT RIGHT KNEE  (Right)  Patient Location: PACU  Anesthesia Type: General  Level of Consciousness: awake and alert   Airway and Oxygen Therapy: Patient Spontanous Breathing  Post-op Pain: mild  Post-op Assessment: Post-op Vital signs reviewed, Patient's Cardiovascular Status Stable, Respiratory Function Stable, Patent Airway and No signs of Nausea or vomiting  Last Vitals:  Filed Vitals:   10/26/14 1507  BP: 135/67  Pulse: 89  Temp: 37 C  Resp: 16    Post-op Vital Signs: stable   Complications: No apparent anesthesia complications

## 2014-10-26 NOTE — H&P (View-Only) (Signed)
Subjective:     Patient ID: [name], female   DOB: [birth year], 74 y.o.   MRN: [id number]  HPI This patient presents to the office for preventive foot care services.  She says she has history of DM, RA and knee infection right knee.  Her toes are contracted causing pain to occur due to her toe contractures.  She also has painful callus under right big toe due to toe position.   She has severe DJD both feet.     Review of Systems     Objective:   Physical Exam GENERAL APPEARANCE: Alert, conversant. Appropriately groomed. No acute distress.  VASCULAR: Pedal pulses palpable at 2/4 DP and PT bilateral.  Capillary refill time is immediate to all digits,  Proximal to distal cooling it warm to warm.  Digital hair growth is present bilateral  NEUROLOGIC: sensation is intact epicritically and protectively to 5.07 monofilament at 5/5 sites bilateral.  Light touch is intact bilateral, vibratory sensation intact bilateral, achilles tendon reflex is intact bilateral.  MUSCULOSKELETAL:  Charcot foot changes both feet.  Deviation noted at the 1-5 MPJ B/L.  Toes are medially deviated at MPJ.  Fixed plantarflexory position of proximal phalanx right foot.  DERMATOLOGIC: skin color, texture, and turgor are within normal limits.  No preulcerative lesions or ulcers  are seen, no interdigital maceration noted.  No open lesions present.  Callus under right hallux. NAILS  Thick disfigured nails 1-5 B/L.        Assessment:     Onychomycosis  ,RA ,DM     Plan:     Debridement and grinding of long thick nails

## 2014-10-26 NOTE — Interval H&P Note (Signed)
History and Physical Interval Note:  10/26/2014 5:50 PM  [name]  has presented today for surgery, with the diagnosis of [place]   The various methods of treatment have been discussed with the patient and family. After consideration of risks, benefits and other options for treatment, the patient has consented to  Procedure(s): IRRIGATION AND DEBRIDEMENT RIGHT KNEE  (Right) as a surgical intervention .  The patient's history has been reviewed, patient examined, no change in status, stable for surgery.  I have reviewed the patient's chart and labs.  Questions were answered to the patient's satisfaction.     [name]

## 2014-10-26 NOTE — Transfer of Care (Signed)
Immediate Anesthesia Transfer of Care Note  Patient: [name]  Procedure(s) Performed: Procedure(s): IRRIGATION AND DEBRIDEMENT RIGHT KNEE  (Right)  Patient Location: PACU  Anesthesia Type:General  Level of Consciousness: sedated  Airway & Oxygen Therapy: Patient Spontanous Breathing and Patient connected to face mask oxygen  Post-op Assessment: Report given to RN and Post -op Vital signs reviewed and stable  Post vital signs: Reviewed and stable  Last Vitals:  Filed Vitals:   10/26/14 1507  BP: 135/67  Pulse: 89  Temp: 37 C  Resp: 16    Complications: No apparent anesthesia complications

## 2014-10-26 NOTE — H&P (Signed)
CC- [name] is a 74 y.o. female who presents with right knee pain.  HPI- . Knee Pain: Patient presents with knee swelling involving the  right knee. Onset of the symptoms was several weeks ago. Inciting event: none known. Current symptoms include pain located anteriorly and swelling. Pain is aggravated by kneeling and walking.  Patient has had prior knee problems. Evaluation to date: plain films: normal. Treatment to date: aspiration of inflammed bursa with recurrent swelling. She had an irrigation and debridement of her right knee prepatellar bursa on 09/16/14 and has had recurrent drainage leading to the need for repeat irrigation and debridement. She presents today for that procedure  Past Medical History  Diagnosis Date  . Asthma   . Osteoporosis   . Diabetes mellitus without complication   . Rheumatoid arthritis(714.0)   . Sleep apnea     no cpap  . Emphysema   . Fibromyalgia   . Complication of anesthesia     Anesthesia told her years ago she got too cold during surgery    Past Surgical History  Procedure Laterality Date  . 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 Foot surgery      numerous  . Ankle fracture surgery  2007  . Partial hysterectomy  1970  . Knee bursectomy Right 09/16/2014    Procedure: EXCISON OF PRE PATELLA BURSA RIGHT KNEE ;  Surgeon: Gaynelle Arabian, MD;  Location: [facility];  Service: Orthopedics;  Laterality: Right;  . Abdominal hysterectomy    . Breast surgery      mammoplasty and then removed    Prior to Admission medications   Medication Sig Start Date End Date Taking? Authorizing Provider  aspirin 81 MG tablet Take 81 mg by mouth 3 (three) times a week.   Yes Historical Provider, MD  atorvastatin (LIPITOR) 40 MG tablet Take 40 mg by mouth daily.  02/24/13  Yes Historical Provider, MD  cetirizine (ZYRTEC) 10 MG tablet Take 20 mg by mouth daily.   Yes Historical Provider, MD  cholecalciferol (VITAMIN D) 1000 UNITS tablet Take 1,000 Units by mouth  daily.   Yes Historical Provider, MD  Coenzyme Q10 (COQ10) 200 MG CAPS Take 1 capsule by mouth daily.   Yes Historical Provider, MD  Cyanocobalamin (B-12) 2500 MCG TABS Take 1 tablet by mouth daily.   Yes Historical Provider, MD  DULoxetine (CYMBALTA) 60 MG capsule Take 60 mg by mouth daily.   Yes Historical Provider, MD  loperamide (IMODIUM) 2 MG capsule Take 4 mg by mouth as needed for diarrhea or loose stools.   Yes Historical Provider, MD  metformin (FORTAMET) 1000 MG (OSM) 24 hr tablet Take 1,000 mg by mouth daily.  03/08/14  Yes Historical Provider, MD  montelukast (SINGULAIR) 10 MG tablet Take 10 mg by mouth daily.  09/30/13  Yes Historical Provider, MD  morphine (MSIR) 30 MG tablet Take 30 mg by mouth every 4 (four) hours as needed for severe pain.    Yes Historical Provider, MD  Multiple Vitamin (MULTIVITAMIN WITH MINERALS) TABS tablet Take 1 tablet by mouth daily.   Yes Historical Provider, MD  Omega-3 Fatty Acids (FISH OIL) 1200 MG CAPS Take 1 capsule by mouth daily.   Yes Historical Provider, MD  pantoprazole (PROTONIX) 40 MG tablet Take 40 mg by mouth daily.   Yes Historical Provider, MD  predniSONE (DELTASONE) 5 MG tablet Take 5 mg by mouth daily.  10/31/09  Yes Historical Provider, MD  ranitidine (  ZANTAC) 150 MG tablet Take 300 mg by mouth daily.  09/30/13  Yes Historical Provider, MD  riTUXimab (RITUXAN) 10 MG/ML injection 1 unit Intravenously every 6 months 10/31/09  Yes Historical Provider, MD  traMADol (ULTRAM) 50 MG tablet Take 50 mg by mouth every 6 (six) hours as needed for moderate pain.  05/12/14  Yes Historical Provider, MD   KNEE EXAM antalgic gait, collateral ligaments intact, swollen pre-patellar bursa without surrounding erythema; serous drainage from previous incision; no purulence  Physical Examination: General appearance - alert, well appearing, and in no distress Mental status - alert, oriented to person, place, and time Chest - clear to auscultation, no wheezes, rales  or rhonchi, symmetric air entry Heart - normal rate, regular rhythm, normal S1, S2, no murmurs, rubs, clicks or gallops Abdomen - soft, nontender, nondistended, no masses or organomegaly Neurological - alert, oriented, normal speech, no focal findings or movement disorder noted    Asessment/Plan--- Right knee pre-patella infection- - Plan right knee irrigation and debridement. Procedure risks and potential comps discussed with patient who elects to proceed. Goals are decreased pain and increased function with a high likelihood of achieving both

## 2014-10-26 NOTE — Anesthesia Preprocedure Evaluation (Addendum)
Anesthesia Evaluation  Patient identified by MRN, date of birth, ID band Patient awake    Reviewed: Allergy & Precautions, NPO status , Patient's Chart, lab work & pertinent test results  Airway Mallampati: II  TM Distance: >3 FB Neck ROM: Full    Dental no notable dental hx.    Pulmonary asthma , sleep apnea , COPDformer smoker,  breath sounds clear to auscultation  Pulmonary exam normal       Cardiovascular negative cardio ROS Normal cardiovascular examRhythm:Regular Rate:Normal     Neuro/Psych negative neurological ROS  negative psych ROS   GI/Hepatic Neg liver ROS, GERD-  ,  Endo/Other  diabetes  Renal/GU negative Renal ROS  negative genitourinary   Musculoskeletal negative musculoskeletal ROS (+)   Abdominal   Peds negative pediatric ROS (+)  Hematology negative hematology ROS (+)   Anesthesia Other Findings   Reproductive/Obstetrics negative OB ROS                            Anesthesia Physical Anesthesia Plan  ASA: III  Anesthesia Plan: General   Post-op Pain Management:    Induction: Intravenous  Airway Management Planned: LMA  Additional Equipment:   Intra-op Plan:   Post-operative Plan: Extubation in OR  Informed Consent: I have reviewed the patients History and Physical, chart, labs and discussed the procedure including the risks, benefits and alternatives for the proposed anesthesia with the patient or authorized representative who has indicated his/her understanding and acceptance.   Dental advisory given  Plan Discussed with: CRNA and Surgeon  Anesthesia Plan Comments:         Anesthesia Quick Evaluation

## 2014-10-26 NOTE — Brief Op Note (Signed)
10/26/2014  7:16 PM  PATIENT:  [name]  74 y.o. female  PRE-OPERATIVE DIAGNOSIS:  RIGHT KNEE [place] INFECTION   POST-OPERATIVE DIAGNOSIS:  RIGHT KNEE PREPATELLA BURSIA INFECTION   PROCEDURE:  Procedure(s): IRRIGATION AND DEBRIDEMENT RIGHT KNEE  (Right)  SURGEON:  Surgeon(s) and Role:    * Gaynelle Arabian, MD - Primary  PHYSICIAN ASSISTANT:   ASSISTANTS: none   ANESTHESIA:   general  EBL:     BLOOD ADMINISTERED:none  DRAINS: (Medium) Hemovact drain(s) in the right knee with  Suction Open   LOCAL MEDICATIONS USED:  NONE  COUNTS:  YES  TOURNIQUET:   Total Tourniquet Time Documented: Thigh (Right) - 35 minutes Total: Thigh (Right) - 35 minutes   DICTATION: .Other Dictation: Dictation Number [id number]  PLAN OF CARE: Admit for overnight observation  PATIENT DISPOSITION:  PACU - hemodynamically stable.

## 2014-10-26 NOTE — Anesthesia Procedure Notes (Signed)
Procedure Name: LMA Insertion Date/Time: 10/26/2014 6:22 PM Performed by: Dione Booze Pre-anesthesia Checklist: Patient identified, Emergency Drugs available, Suction available and Patient being monitored Patient Re-evaluated:Patient Re-evaluated prior to inductionOxygen Delivery Method: Circle system utilized Preoxygenation: Pre-oxygenation with 100% oxygen Intubation Type: IV induction LMA: LMA inserted LMA Size: 4.0 Number of attempts: 1 Placement Confirmation: breath sounds checked- equal and bilateral and positive ETCO2 Tube secured with: Tape Dental Injury: Teeth and Oropharynx as per pre-operative assessment

## 2014-10-26 NOTE — Interval H&P Note (Deleted)
History and Physical Interval Note:  10/26/2014 4:39 PM  [name]  has presented today for surgery, with the diagnosis of [place]   The various methods of treatment have been discussed with the patient and family. After consideration of risks, benefits and other options for treatment, the patient has consented to  Procedure(s): IRRIGATION AND DEBRIDEMENT RIGHT KNEE  (Right) as a surgical intervention .  The patient's history has been reviewed, patient examined, no change in status, stable for surgery.  I have reviewed the patient's chart and labs.  Questions were answered to the patient's satisfaction.     [name] had an irrigation and debridement of her prepatellar bursa on 08/28/14 and has had persistent drainage. She presents today for repeat irrigation and debridement   [name]

## 2014-10-27 ENCOUNTER — Encounter (HOSPITAL_COMMUNITY): Payer: Self-pay | Admitting: Orthopedic Surgery

## 2014-10-27 DIAGNOSIS — J439 Emphysema, unspecified: Secondary | ICD-10-CM | POA: Diagnosis not present

## 2014-10-27 DIAGNOSIS — M069 Rheumatoid arthritis, unspecified: Secondary | ICD-10-CM | POA: Diagnosis not present

## 2014-10-27 DIAGNOSIS — M71161 Other infective bursitis, right knee: Secondary | ICD-10-CM | POA: Diagnosis not present

## 2014-10-27 DIAGNOSIS — J45909 Unspecified asthma, uncomplicated: Secondary | ICD-10-CM | POA: Diagnosis not present

## 2014-10-27 DIAGNOSIS — G4730 Sleep apnea, unspecified: Secondary | ICD-10-CM | POA: Diagnosis not present

## 2014-10-27 DIAGNOSIS — E119 Type 2 diabetes mellitus without complications: Secondary | ICD-10-CM | POA: Diagnosis not present

## 2014-10-27 LAB — GLUCOSE, CAPILLARY
GLUCOSE-CAPILLARY: 187 mg/dL — AB (ref 65–99)
Glucose-Capillary: 125 mg/dL — ABNORMAL HIGH (ref 65–99)
Glucose-Capillary: 129 mg/dL — ABNORMAL HIGH (ref 65–99)
Glucose-Capillary: 107 mg/dL — ABNORMAL HIGH (ref 65–99)

## 2014-10-27 MED ORDER — OXYCODONE HCL 5 MG PO TABS: 5.0000 mg | ORAL_TABLET | ORAL | Status: DC | PRN

## 2014-10-27 MED ORDER — DIPHENHYDRAMINE HCL 25 MG PO CAPS
25.0000 mg | ORAL_CAPSULE | Freq: Three times a day (TID) | ORAL | Status: DC | PRN
  Administered 2014-10-27 – 2014-10-28 (×2): 25 mg via ORAL
  Filled 2014-10-27 (×2): qty 1

## 2014-10-27 MED ORDER — FLUCONAZOLE 150 MG PO TABS
150.0000 mg | ORAL_TABLET | Freq: Once | ORAL | Status: AC
  Administered 2014-10-27: 150 mg via ORAL
  Filled 2014-10-27: qty 1

## 2014-10-27 MED ORDER — DOXYCYCLINE HYCLATE 100 MG PO TABS: 100.0000 mg | ORAL_TABLET | Freq: Two times a day (BID) | ORAL | Status: DC

## 2014-10-27 MED ORDER — DOXYCYCLINE HYCLATE 100 MG PO TABS
100.0000 mg | ORAL_TABLET | Freq: Two times a day (BID) | ORAL | Status: DC
Start: 2014-10-27 — End: 2014-10-28
  Administered 2014-10-27 – 2014-10-28 (×4): 100 mg via ORAL
  Filled 2014-10-27 (×5): qty 1

## 2014-10-27 NOTE — Progress Notes (Signed)
   Subjective: 1 Day Post-Op Procedure(s) (LRB): IRRIGATION AND DEBRIDEMENT RIGHT KNEE  (Right) Patient reports pain as moderate.   Patient seen in rounds with Dr. Wynelle Link. Patient is well, and has had no acute complaints or problems. No issues overnight. No SOB or chest pain.  Plan is to go Home after hospital stay.  Objective: Vital signs in last 24 hours: Temp:  [97.4 F (36.3 C)-98.6 F (37 C)] 98.4 F (36.9 C) (08/18 0512) Pulse Rate:  [85-97] 97 (08/18 0512) Resp:  [14-32] 18 (08/18 0512) BP: (111-135)/(54-101) 117/57 mmHg (08/18 0512) SpO2:  [92 %-98 %] 97 % (08/18 0512) Weight:  [85.911 kg (189 lb 6.4 oz)] 85.911 kg (189 lb 6.4 oz) (08/17 1610)  Intake/Output from previous day:  Intake/Output Summary (Last 24 hours) at 10/27/14 0758 Last data filed at 10/27/14 0430  Gross per 24 hour  Intake 2366.25 ml  Output   1100 ml  Net 1266.25 ml     Labs:  Recent Labs  10/26/14 1545  HGB 12.0    Recent Labs  10/26/14 1545  WBC 9.4  RBC 4.47  HCT 38.4  PLT 296    Recent Labs  10/26/14 1545  NA 140  K 4.5  CL 107  CO2 24  BUN 29*  CREATININE 0.93  GLUCOSE 95  CALCIUM 9.3    EXAM General - Patient is Alert and Oriented Extremity - Neurologically intact Intact pulses distally Dorsiflexion/Plantar flexion intact Compartment soft Dressing - dressing C/D/I Motor Function - intact, moving foot and toes well on exam.    Past Medical History  Diagnosis Date  . Asthma   . Osteoporosis   . Diabetes mellitus without complication   . Rheumatoid arthritis(714.0)   . Sleep apnea     no cpap  . Emphysema   . Fibromyalgia   . Complication of anesthesia     Anesthesia told her years ago she got too cold during surgery    Assessment/Plan: 1 Day Post-Op Procedure(s) (LRB): IRRIGATION AND DEBRIDEMENT RIGHT KNEE  (Right) Active Problems:   Septic prepatellar bursitis of right knee  Estimated body mass index is 31.52 kg/(m^2) as calculated from the  following:   Height as of this encounter: 5\' 5"  (1.651 m).   Weight as of this encounter: 85.911 kg (189 lb 6.4 oz). Advance diet Up with therapy D/C IV fluids Plan for discharge tomorrow  DVT Prophylaxis - Lovenox Weight-Bearing as tolerated  Maintain knee immobilizer  Will keep hemovac until tomorrow. Plan for DC home on antibiotics tomorrow. Will order 3 in 1 and shower chair  Ardeen Jourdain, PA-C Orthopaedic Surgery 10/27/2014, 7:58 AM

## 2014-10-27 NOTE — Op Note (Signed)
NAMELOVADA, BARWICK NO.:  [id number]  MEDICAL RECORD NO.:  [id number]  LOCATION:  [id number]                         FACILITY[facility]  PHYSICIAN:  Gaynelle Arabian, M.D.    DATE OF BIRTH:  Nov 22, 1940  DATE OF PROCEDURE:  10/26/2014 DATE OF DISCHARGE:                              OPERATIVE REPORT   PREOPERATIVE DIAGNOSIS:  Infected right prepatellar bursa.  POSTOPERATIVE DIAGNOSIS:  Infected right prepatellar bursa.  PROCEDURE:  Irrigation and debridement, right knee.  SURGEON:  Gaynelle Arabian, M.D.  ASSISTANT:  No assistant.  ANESTHESIA:  General.  ESTIMATED BLOOD LOSS:  Minimal.  DRAINS:  Hemovac x1.  COMPLICATIONS:  None.  TOURNIQUET TIME:  35 minutes at 300 mmHg.  CONDITION:  Stable to recovery.  BRIEF CLINICAL NOTE:  Ms. Schmuhl is a 74 year old female, who had infected right prepatellar bursa, treated with irrigation and debridement, approximately a month ago.  She has had persistent drainage and has an area of wound breakdown.  It was felt at this time that she needs a repeat irrigation and debridement.  She presents now for that procedure.  PROCEDURE IN DETAIL:  After successful administration of general anesthetic, a tourniquet was placed high on her right thigh and the right lower extremity was prepped and draped in a usual sterile fashion. She had about a 5 mm open area around the mid aspect of her incision.  I excised the previous scar and extended the incision to about 3 cm proximal to the superior pole of the patella and distally just below the patella.  Skin was cut with a 10 blade through subcutaneous tissue. Medial and lateral flaps were made.  No evidence of any gross purulence is noted.  The drainage appeared to be coming from a site just lateral to the knee and did not appear to be coming from the deep tissue.  I thoroughly debrided the subcu area and then excised any of the tissue overlying the patella that looked abnormal.  She has  severe rheumatoid disease and had a total knee arthroplasty.  She had a previously separated off the superior pole patella and there was a patellar component intact, communicating with the inferior aspect of the patella. The backside of this was uncovered and we had tried to cover with soft- tissue at the original debridement.  Today, I immobilized about 2 cm of the distal quadriceps and placed it over the patellar bunion over the patellar component.  I was able to repair this with #1 Ethibond suture. She had a very sturdy repair __________ flexor to 100 degrees without any tension on this.  Prior to this repair, I thoroughly irrigated the joint with about a liter of saline solution.  No evidence of any fluid or purulence in the joint.  After I closed this and then thoroughly irrigated the subcu tissues.  Subcu was closed with interrupted 2-0 Vicryl over a second limb of a Hemovac drain.  The skin was then closed with staples.  Please note at the beginning of procedure, the fluid that we encountered was sent for Gram stain culture and sensitivity.  Once the skin was closed with staples, then we hooked  the drain to suction, released the tourniquet to a total time of 35 minutes.  A bulky sterile dressing was applied.  She was placed into a knee immobilizer, awakened, and transported to recovery in a stable condition.  Please note that this was an incisional debridement and that we incised the skin and then thoroughly irrigated.  This was also excisionally and that we excised the skin edges and excised a small amount of subcutaneous tissue and a small amount of the distal quadriceps tendon. This was all done with a knife and we did irrigate into the joint also.     Gaynelle Arabian, M.D.     FA/MEDQ  D:  10/26/2014  T:  10/27/2014  Job:  [id number]

## 2014-10-27 NOTE — Care Management Note (Signed)
Case Management Note  Patient Details  Name: [name] MRN: [id number] Date of Birth: Jul 14, 1940  Subjective/Objective:                  Septic prepatellar bursitis of right knee  Action/Plan:  Discharge planning Expected Discharge Date:  10/28/14               Expected Discharge Plan:  Home/Self Care  In-House Referral:     Discharge planning Services  CM Consult  Post Acute Care Choice:    Choice offered to:     DME Arranged:  3-N-1, Shower stool DME Agency:  [place]:    [place]:     Status of Service:  Completed, signed off  Medicare Important Message Given:    Date Medicare IM Given:    Medicare IM give by:    Date Additional Medicare IM Given:    Additional Medicare Important Message give by:     If discussed at [place] of Stay Meetings, dates discussed:    Additional Comments: CM notes HHPT recc but not ordered.  CM requested orders and face to face of RN, [name] for HHPT if this is the plan of the MD.  Also, placed request on sticky note.  If pt is to go home with [facility] services, she is active with [name] (nursing was packing the wound).  CM will continue to follow for discharge needs. [name], RN 10/27/2014, 3:00 PM

## 2014-10-27 NOTE — Evaluation (Signed)
Physical Therapy Evaluation Patient Details Name: [name] MRN: [id number] DOB: [birth year] Today's Date: 10/27/2014   History of Present Illness  I and D R knee for infected patellar bursa  Clinical Impression  Patient  Began instruction on donning, repositioning KI. Has  Significant RA of hands which do  Make this difficult. Patient is mobilizing well. Recommend an AIDE for safe showers as patient is not to flex knee per patient. Patient will benefit from PT to address problems listed in note below.    Follow Up Recommendations Home health PT;Supervision - Intermittent (aide)    Equipment Recommendations  None recommended by PT    Recommendations for Other Services       Precautions / Restrictions Precautions Precautions: Fall Precaution Comments: no ROM R knee per patient per Dr. Maureen Ralphs Required Braces or Orthoses: Knee Immobilizer - Right Knee Immobilizer - Right: On at all times Restrictions Other Position/Activity Restrictions: wbat      Mobility  Bed Mobility Overal bed mobility: Modified Independent                Transfers Overall transfer level: Needs assistance Equipment used: Rolling walker (2 wheeled) Transfers: Sit to/from Stand Sit to Stand: Min assist         General transfer comment: cues  for hand placement and to push from hand rest w/ L hand and R on RW. for sit to stand.  Ambulation/Gait Ambulation/Gait assistance: Min assist Ambulation Distance (Feet): 400 Feet Assistive device: Rolling walker (2 wheeled)       General Gait Details: was able to utilize regular RW with hand deformities  Stairs            Wheelchair Mobility    Modified Rankin (Stroke Patients Only)       Balance                                             Pertinent Vitals/Pain Pain Assessment: 0-10 Pain Score: 7  Pain Location: R knee Pain Descriptors / Indicators: Aching;Sore Pain Intervention(s): Limited activity within  patient's tolerance;Monitored during session;RN gave pain meds during session;Ice applied    Home Living Family/patient expects to be discharged to:: Private residence   Available Help at Discharge: Family;Available PRN/intermittently Type of Home: Mobile home Home Access: Ramped entrance     Home Layout: One level Home Equipment: Walker - 2 wheels      Prior Function Level of Independence: Independent         Comments: Has had HHRN for dressing changes     Hand Dominance        Extremity/Trunk Assessment   Upper Extremity Assessment:  (severe RA deformities of hands but funcyional)           Lower Extremity Assessment: RLE deficits/detail RLE Deficits / Details: inknee immobilizer    Cervical / Trunk Assessment: Kyphotic  Communication   Communication: No difficulties  Cognition Arousal/Alertness: Awake/alert Behavior During Therapy: WFL for tasks assessed/performed Overall Cognitive Status: Within Functional Limits for tasks assessed                      General Comments      Exercises        Assessment/Plan    PT Assessment Patient needs continued PT services  PT Diagnosis Difficulty walking;Generalized weakness   PT Problem List Decreased activity  tolerance;Decreased strength;Decreased mobility;Decreased safety awareness;Decreased knowledge of use of DME;Decreased skin integrity;Decreased knowledge of precautions  PT Treatment Interventions DME instruction;Gait training;Functional mobility training;Therapeutic activities;Therapeutic exercise;Patient/family education   PT Goals (Current goals can be found in the Care Plan section) Acute Rehab PT Goals Patient Stated Goal: to go home PT Goal Formulation: With patient Time For Goal Achievement: 11/03/14 Potential to Achieve Goals: Good    Frequency 7X/week   Barriers to discharge Decreased caregiver support      Co-evaluation               End of Session Equipment Utilized  During Treatment: Right knee immobilizer Activity Tolerance: Patient tolerated treatment well Patient left: in bed;with call bell/phone within reach Nurse Communication: Mobility status;Patient requests pain meds    Functional Assessment Tool Used: clinical judgement Functional Limitation: Mobility: Walking and moving around Mobility: Walking and Moving Around Current Status [phone]): At least 20 percent but less than 40 percent impaired, limited or restricted Mobility: Walking and Moving Around Goal Status [phone]): At least 1 percent but less than 20 percent impaired, limited or restricted    Time: 1124-1210 PT Time Calculation (min) (ACUTE ONLY): 46 min   Charges:   PT Evaluation $Initial PT Evaluation Tier I: 1 Procedure PT Treatments $Gait Training: 8-22 mins $Self Care/Home Management: 8-22   PT G Codes:   PT G-Codes **NOT FOR INPATIENT CLASS** Functional Assessment Tool Used: clinical judgement Functional Limitation: Mobility: Walking and moving around Mobility: Walking and Moving Around Current Status (S0109): At least 20 percent but less than 40 percent impaired, limited or restricted Mobility: Walking and Moving Around Goal Status [phone]): At least 1 percent but less than 20 percent impaired, limited or restricted    [name] 10/27/2014, 1:04 PM

## 2014-10-27 NOTE — Discharge Instructions (Addendum)
Change dressing daily with sterile 4x4 gauze and paper tape Shower only, no tub bath. Call if any temperatures greater than 101 or any wound complications: [phone] during the day Take aspirin 325mg  daily to prevent blood clots

## 2014-10-27 NOTE — Progress Notes (Signed)
Physical Therapy Treatment Patient Details Name: [name] MRN: [id number] DOB: 1940/06/16 Today's Date: 10/27/2014    History of Present Illness I and D R knee for infected patellar bursa    PT Comments    Patient mobilizing well, may have difficulty with adjusting KI due to hand deformities.  Follow Up Recommendations  Home health PT;Supervision - Intermittent (aide)     Equipment Recommendations  None recommended by PT    Recommendations for Other Services       Precautions / Restrictions Precautions Precaution Comments: no ROM R knee per patient per Dr. Maureen Ralphs Required Braces or Orthoses: Knee Immobilizer - Right Knee Immobilizer - Right: On at all times    Mobility  Bed Mobility Overal bed mobility: Modified Independent                Transfers Overall transfer level: Needs assistance Equipment used: Rolling walker (2 wheeled) Transfers: Sit to/from Stand Sit to Stand: Supervision         General transfer comment: cues  for hand placement and to push from hand rest w/ L hand and R on RW. for sit to stand.  Ambulation/Gait Ambulation/Gait assistance: Min guard Ambulation Distance (Feet): 20 Feet Assistive device: Rolling walker (2 wheeled)       General Gait Details: was able to utilize regular RW with hand deformities   Stairs            Wheelchair Mobility    Modified Rankin (Stroke Patients Only)       Balance                                    Cognition Arousal/Alertness: Awake/alert                          Exercises      General Comments        Pertinent Vitals/Pain Pain Score: 5  Pain Location: R knee Pain Descriptors / Indicators: Aching;Burning Pain Intervention(s): Premedicated before session    Home Living                      Prior Function            PT Goals (current goals can now be found in the care plan section) Acute Rehab PT Goals Patient Stated Goal: to go  home PT Goal Formulation: With patient Time For Goal Achievement: 11/03/14 Potential to Achieve Goals: Good Additional Goals Additional Goal #1: patient will don and reposition KI  appropriately. Progress towards PT goals: Progressing toward goals    Frequency  7X/week    PT Plan Current plan remains appropriate    Co-evaluation             End of Session Equipment Utilized During Treatment: Right knee immobilizer Activity Tolerance: Patient tolerated treatment well Patient left: in bed;with call bell/phone within reach;with bed alarm set     Time: 1423-1435 PT Time Calculation (min) (ACUTE ONLY): 12 min  Charges:  $Gait Training: 8-22 mins $Self Care/Home Management: 8-22                    G Codes:  Functional Assessment Tool Used: clinical judgement Functional Limitation: Mobility: Walking and moving around Mobility: Walking and Moving Around Current Status (Q7591): At least 20 percent but less than 40 percent impaired, limited or restricted  Mobility: Walking and Moving Around Goal Status [phone]): At least 1 percent but less than 20 percent impaired, limited or restricted   Claretha Cooper 10/27/2014, 4:52 PM

## 2014-10-28 DIAGNOSIS — J439 Emphysema, unspecified: Secondary | ICD-10-CM | POA: Diagnosis not present

## 2014-10-28 DIAGNOSIS — M069 Rheumatoid arthritis, unspecified: Secondary | ICD-10-CM | POA: Diagnosis not present

## 2014-10-28 DIAGNOSIS — E119 Type 2 diabetes mellitus without complications: Secondary | ICD-10-CM | POA: Diagnosis not present

## 2014-10-28 DIAGNOSIS — M71161 Other infective bursitis, right knee: Secondary | ICD-10-CM | POA: Diagnosis not present

## 2014-10-28 DIAGNOSIS — J45909 Unspecified asthma, uncomplicated: Secondary | ICD-10-CM | POA: Diagnosis not present

## 2014-10-28 DIAGNOSIS — G4730 Sleep apnea, unspecified: Secondary | ICD-10-CM | POA: Diagnosis not present

## 2014-10-28 LAB — GLUCOSE, CAPILLARY
GLUCOSE-CAPILLARY: 135 mg/dL — AB (ref 65–99)
Glucose-Capillary: 93 mg/dL (ref 65–99)

## 2014-10-28 NOTE — Progress Notes (Signed)
Physical Therapy Treatment Patient Details Name: BONNA STEURY MRN: [id number] DOB: Feb 22, 1941 Today's Date: 10/28/2014    History of Present Illness I and D R knee for infected patellar bursa    PT Comments    Adapted straps on KI with  A safety pin and folded end so patient could grip better. Placed strap  Across where knee should be located for proper fit.. Patient acknowledged. Provided  theraband to self assist lifting the R leg on/off of bed. Contacted Tim from [facility] to ensure that HHPT is aware of order  For no ROM R knee and  KI to be on at all times.   Follow Up Recommendations  Home health PT;Supervision - Intermittent     Equipment Recommendations  None recommended by PT    Recommendations for Other Services       Precautions / Restrictions Precautions Precautions: Fall Precaution Comments: no ROM R knee per patient per Dr. Maureen Ralphs Required Braces or Orthoses: Knee Immobilizer - Right Knee Immobilizer - Right: On at all times    Mobility  Bed Mobility Overal bed mobility: Modified Independent                Transfers   Equipment used: Rolling walker (2 wheeled) Transfers: Sit to/from Stand Sit to Stand: Supervision         General transfer comment: cues  for hand placement and to push from hand rest w/ L hand and R on RW. for sit to stand.  Ambulation/Gait Ambulation/Gait assistance: Supervision Ambulation Distance (Feet): 200 Feet Assistive device: Rolling walker (2 wheeled)       General Gait Details: cues  for sequence   Stairs            Wheelchair Mobility    Modified Rankin (Stroke Patients Only)       Balance                                    Cognition Arousal/Alertness: Awake/alert                          Exercises      General Comments        Pertinent Vitals/Pain Pain Score: 2  Pain Location: R knee Pain Descriptors / Indicators: Discomfort;Tightness;Sore Pain Intervention(s):  Monitored during session;Premedicated before session    Home Living                      Prior Function            PT Goals (current goals can now be found in the care plan section) Progress towards PT goals: Progressing toward goals    Frequency  7X/week    PT Plan Current plan remains appropriate    Co-evaluation             End of Session Equipment Utilized During Treatment: Right knee immobilizer Activity Tolerance: Patient tolerated treatment well Patient left: in chair     Time: 9741-6384 PT Time Calculation (min) (ACUTE ONLY): 64 min  Charges:  $Gait Training: 23-37 mins $Self Care/Home Management: 23-37                    G Codes:      Claretha Cooper 10/28/2014, 4:04 PM Tresa Endo PT [phone]

## 2014-10-28 NOTE — Progress Notes (Signed)
   Subjective: 2 Days Post-Op Procedure(s) (LRB): IRRIGATION AND DEBRIDEMENT RIGHT KNEE  (Right) Patient reports pain as mild.   Patient seen in rounds with Dr. Wynelle Link. Patient is well, and has had no acute complaints or problems. Was able to get some rest last night.  Plan is to go Home after hospital stay.  Objective: Vital signs in last 24 hours: Temp:  [97.5 F (36.4 C)-98.5 F (36.9 C)] 98.1 F (36.7 C) (08/19 0551) Pulse Rate:  [79-99] 86 (08/19 0551) Resp:  [18] 18 (08/19 0551) BP: (121-135)/(67-80) 135/80 mmHg (08/19 0551) SpO2:  [96 %-100 %] 97 % (08/19 0551)  Intake/Output from previous day:  Intake/Output Summary (Last 24 hours) at 10/28/14 0803 Last data filed at 10/28/14 0300  Gross per 24 hour  Intake 1012.5 ml  Output    555 ml  Net  457.5 ml     Labs:  Recent Labs  10/26/14 1545  HGB 12.0    Recent Labs  10/26/14 1545  WBC 9.4  RBC 4.47  HCT 38.4  PLT 296    Recent Labs  10/26/14 1545  NA 140  K 4.5  CL 107  CO2 24  BUN 29*  CREATININE 0.93  GLUCOSE 95  CALCIUM 9.3    EXAM General - Patient is Alert and Oriented Extremity - Neurologically intact Intact pulses distally Dorsiflexion/Plantar flexion intact Compartment soft Dressing/Incision - clean, dry, no drainage Motor Function - intact, moving foot and toes well on exam.   Past Medical History  Diagnosis Date  . Asthma   . Osteoporosis   . Diabetes mellitus without complication   . Rheumatoid arthritis(714.0)   . Sleep apnea     no cpap  . Emphysema   . Fibromyalgia   . Complication of anesthesia     Anesthesia told her years ago she got too cold during surgery    Assessment/Plan: 2 Days Post-Op Procedure(s) (LRB): IRRIGATION AND DEBRIDEMENT RIGHT KNEE  (Right) Active Problems:   Septic prepatellar bursitis of right knee  Estimated body mass index is 31.52 kg/(m^2) as calculated from the following:   Height as of this encounter: 5\' 5"  (1.651 m).   Weight as  of this encounter: 85.911 kg (189 lb 6.4 oz). Advance diet Up with therapy D/C IV fluids Discharge home with home health  DVT Prophylaxis - Lovenox Weight-Bearing as tolerated   Continue PT today. Plan for DC home today.  Ardeen Jourdain, PA-C Orthopaedic Surgery 10/28/2014, 8:03 AM

## 2014-10-29 LAB — WOUND CULTURE: Culture: NO GROWTH

## 2014-10-30 DIAGNOSIS — E119 Type 2 diabetes mellitus without complications: Secondary | ICD-10-CM | POA: Diagnosis not present

## 2014-10-30 DIAGNOSIS — M797 Fibromyalgia: Secondary | ICD-10-CM | POA: Diagnosis not present

## 2014-10-30 DIAGNOSIS — T814XXD Infection following a procedure, subsequent encounter: Secondary | ICD-10-CM | POA: Diagnosis not present

## 2014-10-30 DIAGNOSIS — M069 Rheumatoid arthritis, unspecified: Secondary | ICD-10-CM | POA: Diagnosis not present

## 2014-10-30 DIAGNOSIS — J439 Emphysema, unspecified: Secondary | ICD-10-CM | POA: Diagnosis not present

## 2014-10-30 DIAGNOSIS — M7041 Prepatellar bursitis, right knee: Secondary | ICD-10-CM | POA: Diagnosis not present

## 2014-10-30 NOTE — Discharge Summary (Signed)
Physician Discharge Summary   Patient ID: [name] MRN: [id number] DOB/AGE: [birth year] 73 y.o.  Admit date: 10/26/2014 Discharge date: 10/28/2014  Primary Diagnosis: infection right knee   Admission Diagnoses:  Past Medical History  Diagnosis Date  . Asthma   . Osteoporosis   . Diabetes mellitus without complication   . Rheumatoid arthritis(714.0)   . Sleep apnea     no cpap  . Emphysema   . Fibromyalgia   . Complication of anesthesia     Anesthesia told her years ago she got too cold during surgery   Discharge Diagnoses:   Active Problems:   Septic prepatellar bursitis of right knee  Estimated body mass index is 31.52 kg/(m^2) as calculated from the following:   Height as of this encounter: 5' 5"  (1.651 m).   Weight as of this encounter: 85.911 kg (189 lb 6.4 oz).  Procedure:  Procedure(s) (LRB): IRRIGATION AND DEBRIDEMENT RIGHT KNEE  (Right)   Consults: None  HPI: Patient presents with knee swelling involving the right knee. Onset of the symptoms was several weeks ago. Inciting event: none known. Current symptoms include pain located anteriorly and swelling. Pain is aggravated by kneeling and walking. Patient has had prior knee problems. Evaluation to date: plain films: normal. Treatment to date: aspiration of inflammed bursa with recurrent swelling. She had an irrigation and debridement of her right knee prepatellar bursa on 09/16/14 and has had recurrent drainage leading to the need for repeat irrigation and debridement. She presents today for that procedure  Laboratory Data: Admission on 10/26/2014, Discharged on 10/28/2014  Component Date Value Ref Range Status  . Sodium 10/26/2014 140  135 - 145 mmol/L Final  . Potassium 10/26/2014 4.5  3.5 - 5.1 mmol/L Final  . Chloride 10/26/2014 107  101 - 111 mmol/L Final  . CO2 10/26/2014 24  22 - 32 mmol/L Final  . Glucose, Bld 10/26/2014 95  65 - 99 mg/dL Final  . BUN 10/26/2014 29* 6 - 20 mg/dL Final  . Creatinine,  Ser 10/26/2014 0.93  0.44 - 1.00 mg/dL Final  . Calcium 10/26/2014 9.3  8.9 - 10.3 mg/dL Final  . GFR calc non Af Amer 10/26/2014 60* >60 mL/min Final  . GFR calc Af Amer 10/26/2014 >60  >60 mL/min Final   Comment: (NOTE) The eGFR has been calculated using the CKD EPI equation. This calculation has not been validated in all clinical situations. eGFR's persistently <60 mL/min signify possible Chronic Kidney Disease.   . Anion gap 10/26/2014 9  5 - 15 Final  . WBC 10/26/2014 9.4  4.0 - 10.5 K/uL Final  . RBC 10/26/2014 4.47  3.87 - 5.11 MIL/uL Final  . Hemoglobin 10/26/2014 12.0  12.0 - 15.0 g/dL Final  . HCT 10/26/2014 38.4  36.0 - 46.0 % Final  . MCV 10/26/2014 85.9  78.0 - 100.0 fL Final  . MCH 10/26/2014 26.8  26.0 - 34.0 pg Final  . MCHC 10/26/2014 31.3  30.0 - 36.0 g/dL Final  . RDW 10/26/2014 14.8  11.5 - 15.5 % Final  . Platelets 10/26/2014 296  150 - 400 K/uL Final  . Glucose-Capillary 10/26/2014 93  65 - 99 mg/dL Final  . Specimen Description 10/26/2014 WOUND   Final  . Special Requests 10/26/2014 NONE   Final  . Gram Stain 10/26/2014    Final                   Value:RARE WBC PRESENT,BOTH PMN AND MONONUCLEAR NO SQUAMOUS EPITHELIAL  CELLS SEEN NO ORGANISMS SEEN Performed at [facility]   . Culture 10/26/2014    Final                   Value:NO GROWTH 2 DAYS Performed at [facility]   . Report Status 10/26/2014 10/29/2014 FINAL   Final  . Glucose-Capillary 10/26/2014 121* 65 - 99 mg/dL Final  . Specimen Description 10/26/2014 KNEE RIGHT   Final  . Special Requests 10/26/2014 NONE   Final  . Gram Stain 10/26/2014    Final                   Value:RARE WBC PRESENT,BOTH PMN AND MONONUCLEAR NO SQUAMOUS EPITHELIAL CELLS SEEN NO ORGANISMS SEEN Performed at [facility]   . Culture 10/26/2014    Final                   Value:NO ANAEROBES ISOLATED; CULTURE IN PROGRESS FOR 5 DAYS Performed at [facility]   . Report Status 10/26/2014 PENDING    Incomplete  . Glucose-Capillary 10/27/2014 187* 65 - 99 mg/dL Final  . Glucose-Capillary 10/27/2014 125* 65 - 99 mg/dL Final  . Glucose-Capillary 10/27/2014 129* 65 - 99 mg/dL Final  . Glucose-Capillary 10/27/2014 107* 65 - 99 mg/dL Final  . Comment 1 10/27/2014 Notify RN   Final  . Comment 2 10/27/2014 Document in Chart   Final  . Glucose-Capillary 10/28/2014 93  65 - 99 mg/dL Final  . Glucose-Capillary 10/28/2014 135* 65 - 99 mg/dL Final  Admission on 09/16/2014, Discharged on 09/16/2014  Component Date Value Ref Range Status  . Sodium 09/16/2014 138  135 - 145 mmol/L Final  . Potassium 09/16/2014 4.3  3.5 - 5.1 mmol/L Final  . Chloride 09/16/2014 106  101 - 111 mmol/L Final  . CO2 09/16/2014 21* 22 - 32 mmol/L Final  . Glucose, Bld 09/16/2014 102* 65 - 99 mg/dL Final  . BUN 09/16/2014 38* 6 - 20 mg/dL Final  . Creatinine, Ser 09/16/2014 1.32* 0.44 - 1.00 mg/dL Final  . Calcium 09/16/2014 9.0  8.9 - 10.3 mg/dL Final  . Total Protein 09/16/2014 7.0  6.5 - 8.1 g/dL Final  . Albumin 09/16/2014 3.9  3.5 - 5.0 g/dL Final  . AST 09/16/2014 43* 15 - 41 U/L Final  . ALT 09/16/2014 49  14 - 54 U/L Final  . Alkaline Phosphatase 09/16/2014 80  38 - 126 U/L Final  . Total Bilirubin 09/16/2014 1.3* 0.3 - 1.2 mg/dL Final  . GFR calc non Af Amer 09/16/2014 39* >60 mL/min Final  . GFR calc Af Amer 09/16/2014 45* >60 mL/min Final   Comment: (NOTE) The eGFR has been calculated using the CKD EPI equation. This calculation has not been validated in all clinical situations. eGFR's persistently <60 mL/min signify possible Chronic Kidney Disease.   . Anion gap 09/16/2014 11  5 - 15 Final  . Prothrombin Time 09/16/2014 13.7  11.6 - 15.2 seconds Final  . INR 09/16/2014 1.03  0.00 - 1.49 Final  . Color, Urine 09/16/2014 [name]* YELLOW Final   BIOCHEMICALS MAY BE AFFECTED BY COLOR  . APPearance 09/16/2014 CLOUDY* CLEAR Final  . Specific Gravity, Urine 09/16/2014 1.025  1.005 - 1.030 Final  . pH  09/16/2014 6.0  5.0 - 8.0 Final  . Glucose, UA 09/16/2014 NEGATIVE  NEGATIVE mg/dL Final  . Hgb urine dipstick 09/16/2014 NEGATIVE  NEGATIVE Final  . Bilirubin Urine 09/16/2014 SMALL* NEGATIVE Final  . Ketones,  ur 09/16/2014 NEGATIVE  NEGATIVE mg/dL Final  . Protein, ur 09/16/2014 30* NEGATIVE mg/dL Final  . Urobilinogen, UA 09/16/2014 0.2  0.0 - 1.0 mg/dL Final  . Nitrite 09/16/2014 NEGATIVE  NEGATIVE Final  . Leukocytes, UA 09/16/2014 SMALL* NEGATIVE Final  . Squamous Epithelial / LPF 09/16/2014 MANY* RARE Final  . WBC, UA 09/16/2014 3-6  <3 WBC/hpf Final  . Bacteria, UA 09/16/2014 MANY* RARE Final  . Casts 09/16/2014 HYALINE CASTS* NEGATIVE Final  . Glucose-Capillary 09/16/2014 100* 65 - 99 mg/dL Final  . Glucose-Capillary 09/16/2014 110* 65 - 99 mg/dL Final  . Comment 1 09/16/2014 Notify RN   Final  . Comment 2 09/16/2014 Document in Chart   Final      EKG: Orders placed or performed during the hospital encounter of 09/16/14  . EKG 12-Lead  . EKG 12-Lead     Hospital Course: [name] is a 74 y.o. who was admitted to [facility]. They were brought to the operating room on 10/26/2014 and underwent Procedure(s): IRRIGATION AND DEBRIDEMENT RIGHT KNEE .  Patient tolerated the procedure well and was later transferred to the recovery room and then to the orthopaedic floor for postoperative care.  They were given PO and IV analgesics for pain control following their surgery.  They were given 24 hours of postoperative antibiotics of  Anti-infectives    Start     Dose/Rate Route Frequency Ordered Stop   10/27/14 1600  fluconazole (DIFLUCAN) tablet 150 mg     150 mg Oral  Once 10/27/14 1539 10/27/14 1727   10/27/14 1000  doxycycline (VIBRA-TABS) tablet 100 mg  Status:  Discontinued     100 mg Oral Every 12 hours 10/27/14 0802 10/28/14 1900   10/27/14 0600  vancomycin (VANCOCIN) IVPB 1000 mg/200 mL premix     1,000 mg 200 mL/hr over 60 Minutes Intravenous Every 12 hours  10/26/14 2107 10/27/14 0658   10/27/14 0000  doxycycline (VIBRA-TABS) 100 MG tablet     100 mg Oral Every 12 hours 10/27/14 0805     10/26/14 1445  vancomycin (VANCOCIN) IVPB 1000 mg/200 mL premix     1,000 mg 200 mL/hr over 60 Minutes Intravenous On call to O.R. 10/26/14 1441 10/26/14 1749     and started on DVT prophylaxis in the form of Lovenox.   PT and OT were ordered. Discharge planning consulted to help with postop disposition and equipment needs.  Patient had a fair night on the evening of surgery.  They started to get up OOB with therapy on day one. Hemovac drain was pulled without difficulty post op day two.  Continued to work with therapy into day two.  Dressing was changed on day two and the incision was clean and dry.  The patient had progressed with therapy and meeting their goals.  Incision was healing well.  Patient was seen in rounds and was ready to go home with oral antibiotics.    Diet: Cardiac diet Activity:WBAT Follow-up:in 1 week Disposition - Home Discharged Condition: stable   Discharge Instructions    Call MD / Call 911    Complete by:  As directed   If you experience chest pain or shortness of breath, CALL 911 and be transported to the hospital emergency room.  If you develope a fever above 101 F, pus (white drainage) or increased drainage or redness at the wound, or calf pain, call your surgeon's office.     Constipation Prevention    Complete by:  As  directed   Drink plenty of fluids.  Prune juice may be helpful.  You may use a stool softener, such as Colace (over the counter) 100 mg twice a day.  Use MiraLax (over the counter) for constipation as needed.     Diet - low sodium heart healthy    Complete by:  As directed      Discharge instructions    Complete by:  As directed   Change dressing daily with sterile 4x4 gauze and paper tape Shower only, no tub bath. Call if any temperatures greater than 101 or any wound complications: [phone] during the day      Increase activity slowly as tolerated    Complete by:  As directed             Medication List    STOP taking these medications        aspirin 81 MG tablet     ciprofloxacin 500 MG tablet  Commonly known as:  CIPRO      TAKE these medications        atorvastatin 40 MG tablet  Commonly known as:  LIPITOR  Take 40 mg by mouth daily.     B-12 2500 MCG Tabs  Take 1 tablet by mouth daily.     cetirizine 10 MG tablet  Commonly known as:  ZYRTEC  Take 20 mg by mouth at bedtime.     cholecalciferol 1000 UNITS tablet  Commonly known as:  VITAMIN D  Take 1,000 Units by mouth daily.     doxycycline 100 MG tablet  Commonly known as:  VIBRA-TABS  Take 1 tablet (100 mg total) by mouth every 12 (twelve) hours.     DULoxetine 60 MG capsule  Commonly known as:  CYMBALTA  Take 60 mg by mouth at bedtime.     Fish Oil 1200 MG Caps  Take 1 capsule by mouth daily.     HYDROcodone-acetaminophen 5-325 MG per tablet  Commonly known as:  NORCO  Take 1-2 tablets by mouth every 4 (four) hours as needed for severe pain.     loperamide 2 MG capsule  Commonly known as:  IMODIUM  Take 4 mg by mouth as needed for diarrhea or loose stools.     metformin 1000 MG (OSM) 24 hr tablet  Commonly known as:  FORTAMET  Take 1,000 mg by mouth daily.     montelukast 10 MG tablet  Commonly known as:  SINGULAIR  Take 10 mg by mouth daily.     morphine 30 MG tablet  Commonly known as:  MSIR  Take 30 mg by mouth 2 (two) times daily as needed for moderate pain or severe pain.     multivitamin with minerals Tabs tablet  Take 1 tablet by mouth daily.     oxyCODONE 5 MG immediate release tablet  Commonly known as:  Oxy IR/ROXICODONE  Take 1-2 tablets (5-10 mg total) by mouth every 3 (three) hours as needed for breakthrough pain.     pantoprazole 40 MG tablet  Commonly known as:  PROTONIX  Take 40 mg by mouth daily.     predniSONE 5 MG tablet  Commonly known as:  DELTASONE  Take 5 mg by mouth  daily.     ranitidine 150 MG tablet  Commonly known as:  ZANTAC  Take 300 mg by mouth daily.     RITUXAN 10 MG/ML injection  Generic drug:  riTUXimab  1 unit Intravenously every 6 months  Follow-up Information    Follow up with [name], MD. Schedule an appointment as soon as possible for a visit on 11/03/2014.   Specialty:  Orthopedic Surgery   Contact information:   [address] [place] [address] [phone]       Signed: Ardeen Jourdain, PA-C Orthopaedic Surgery 10/30/2014, 12:28 PM

## 2014-10-31 LAB — ANAEROBIC CULTURE

## 2014-11-01 DIAGNOSIS — Z96651 Presence of right artificial knee joint: Secondary | ICD-10-CM | POA: Diagnosis not present

## 2014-11-01 DIAGNOSIS — Z48817 Encounter for surgical aftercare following surgery on the skin and subcutaneous tissue: Secondary | ICD-10-CM | POA: Diagnosis not present

## 2014-11-01 DIAGNOSIS — Z471 Aftercare following joint replacement surgery: Secondary | ICD-10-CM | POA: Diagnosis not present

## 2014-11-08 DIAGNOSIS — Z96651 Presence of right artificial knee joint: Secondary | ICD-10-CM | POA: Diagnosis not present

## 2014-11-08 DIAGNOSIS — Z48817 Encounter for surgical aftercare following surgery on the skin and subcutaneous tissue: Secondary | ICD-10-CM | POA: Diagnosis not present

## 2014-11-15 DIAGNOSIS — Z96651 Presence of right artificial knee joint: Secondary | ICD-10-CM | POA: Diagnosis not present

## 2014-11-15 DIAGNOSIS — Z48817 Encounter for surgical aftercare following surgery on the skin and subcutaneous tissue: Secondary | ICD-10-CM | POA: Diagnosis not present

## 2014-11-15 DIAGNOSIS — Z471 Aftercare following joint replacement surgery: Secondary | ICD-10-CM | POA: Diagnosis not present

## 2014-11-22 DIAGNOSIS — Z96651 Presence of right artificial knee joint: Secondary | ICD-10-CM | POA: Diagnosis not present

## 2014-11-22 DIAGNOSIS — Z471 Aftercare following joint replacement surgery: Secondary | ICD-10-CM | POA: Diagnosis not present

## 2014-12-12 ENCOUNTER — Ambulatory Visit: Payer: Medicare Other | Admitting: Podiatry

## 2014-12-13 DIAGNOSIS — Z96651 Presence of right artificial knee joint: Secondary | ICD-10-CM | POA: Diagnosis not present

## 2014-12-13 DIAGNOSIS — M0589 Other rheumatoid arthritis with rheumatoid factor of multiple sites: Secondary | ICD-10-CM | POA: Diagnosis not present

## 2014-12-13 DIAGNOSIS — Z79899 Other long term (current) drug therapy: Secondary | ICD-10-CM | POA: Diagnosis not present

## 2014-12-13 DIAGNOSIS — Z471 Aftercare following joint replacement surgery: Secondary | ICD-10-CM | POA: Diagnosis not present

## 2014-12-13 DIAGNOSIS — Z7952 Long term (current) use of systemic steroids: Secondary | ICD-10-CM | POA: Diagnosis not present

## 2014-12-13 DIAGNOSIS — Z48817 Encounter for surgical aftercare following surgery on the skin and subcutaneous tissue: Secondary | ICD-10-CM | POA: Diagnosis not present

## 2014-12-15 DIAGNOSIS — S60041A Contusion of right ring finger without damage to nail, initial encounter: Secondary | ICD-10-CM | POA: Diagnosis not present

## 2014-12-15 DIAGNOSIS — S8001XA Contusion of right knee, initial encounter: Secondary | ICD-10-CM | POA: Diagnosis not present

## 2014-12-15 DIAGNOSIS — S60051A Contusion of right little finger without damage to nail, initial encounter: Secondary | ICD-10-CM | POA: Diagnosis not present

## 2014-12-15 DIAGNOSIS — S60031A Contusion of right middle finger without damage to nail, initial encounter: Secondary | ICD-10-CM | POA: Diagnosis not present

## 2015-01-04 ENCOUNTER — Encounter (HOSPITAL_COMMUNITY): Payer: Medicare Other

## 2015-01-04 ENCOUNTER — Other Ambulatory Visit (HOSPITAL_COMMUNITY): Payer: Self-pay | Admitting: *Deleted

## 2015-01-05 ENCOUNTER — Encounter (HOSPITAL_COMMUNITY)
Admission: RE | Admit: 2015-01-05 | Discharge: 2015-01-05 | Disposition: A | Discharge status: Home / Self Care | Payer: Medicare Other | Source: Ambulatory Visit | Attending: Physician Assistant | Admitting: Physician Assistant

## 2015-01-05 DIAGNOSIS — M069 Rheumatoid arthritis, unspecified: Secondary | ICD-10-CM | POA: Diagnosis not present

## 2015-01-05 MED ORDER — METHYLPREDNISOLONE SODIUM SUCC 125 MG IJ SOLR
INTRAMUSCULAR | Status: AC
Start: 2015-01-05 — End: 2015-01-05
  Administered 2015-01-05: 100 mg via INTRAVENOUS
  Filled 2015-01-05: qty 2

## 2015-01-05 MED ORDER — SODIUM CHLORIDE 0.9 % IV SOLN
1000.0000 mg | INTRAVENOUS | Status: DC
  Administered 2015-01-05: 1000 mg via INTRAVENOUS
  Filled 2015-01-05: qty 100

## 2015-01-05 MED ORDER — DIPHENHYDRAMINE HCL 50 MG/ML IJ SOLN: 25.0000 mg | INTRAMUSCULAR | Status: DC

## 2015-01-05 MED ORDER — DIPHENHYDRAMINE HCL 50 MG/ML IJ SOLN
INTRAMUSCULAR | Status: AC
  Administered 2015-01-05: 25 mg
  Filled 2015-01-05: qty 1

## 2015-01-05 MED ORDER — METHYLPREDNISOLONE SODIUM SUCC 125 MG IJ SOLR: 100.0000 mg | INTRAMUSCULAR | Status: DC

## 2015-01-05 MED ORDER — SODIUM CHLORIDE 0.9 % IV SOLN
INTRAVENOUS | Status: DC
  Administered 2015-01-05: 09:00:00 via INTRAVENOUS

## 2015-01-09 ENCOUNTER — Encounter: Payer: Self-pay | Admitting: Podiatry

## 2015-01-09 ENCOUNTER — Ambulatory Visit (INDEPENDENT_AMBULATORY_CARE_PROVIDER_SITE_OTHER): Payer: Medicare Other | Admitting: Podiatry

## 2015-01-09 DIAGNOSIS — B351 Tinea unguium: Secondary | ICD-10-CM | POA: Diagnosis not present

## 2015-01-09 DIAGNOSIS — M2010 Hallux valgus (acquired), unspecified foot: Secondary | ICD-10-CM | POA: Diagnosis not present

## 2015-01-09 DIAGNOSIS — M79676 Pain in unspecified toe(s): Secondary | ICD-10-CM

## 2015-01-09 NOTE — Progress Notes (Signed)
Patient ID: [name], female   DOB: 12/12/1940, 74 y.o.   MRN: [id number] Complaint:  Visit Type: Patient returns to my office for continued preventative foot care services. Complaint: Patient states" my nails have grown long and thick and become painful to walk and wear shoes" Patient has been diagnosed with RA.. The patient presents for preventative foot care services. No changes to ROS  Podiatric Exam: Vascular: dorsalis pedis and posterior tibial pulses are palpable bilateral. Capillary return is immediate. Temperature gradient is WNL. Skin turgor WNL  Sensorium: Normal Semmes Weinstein monofilament test. Normal tactile sensation bilaterally. Nail Exam: Pt has thick disfigured discolored nails with subungual debris noted bilateral entire nail hallux through fifth toenails Ulcer Exam: There is no evidence of ulcer or pre-ulcerative changes or infection. Orthopedic Exam: Muscle tone and strength are WNL. No limitations in general ROM. 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 No infection or ulcers.  Asymptomatic pinch callus right foot. Asymptomatic callus sub 2st metabase B/L  Diagnosis:  Onychomycosis, , Pain in right toe, pain in left toes  Treatment & Plan Procedures and Treatment: Consent by patient was obtained for treatment procedures. The patient understood the discussion of treatment and procedures well. All questions were answered thoroughly reviewed. Debridement of mycotic and hypertrophic toenails, 1 through 5 bilateral and clearing of subungual debris. No ulceration, no infection noted.  Return Visit-Office Procedure: Patient instructed to return to the office for a follow up visit 3 months for continued evaluation and treatment.

## 2015-01-12 DIAGNOSIS — Z23 Encounter for immunization: Secondary | ICD-10-CM | POA: Diagnosis not present

## 2015-01-18 ENCOUNTER — Other Ambulatory Visit (HOSPITAL_COMMUNITY): Payer: Self-pay | Admitting: *Deleted

## 2015-01-19 ENCOUNTER — Encounter (HOSPITAL_COMMUNITY)
Admission: RE | Admit: 2015-01-19 | Discharge: 2015-01-19 | Disposition: A | Discharge status: Home / Self Care | Payer: Medicare Other | Source: Ambulatory Visit | Attending: Physician Assistant | Admitting: Physician Assistant

## 2015-01-19 DIAGNOSIS — M069 Rheumatoid arthritis, unspecified: Secondary | ICD-10-CM | POA: Diagnosis not present

## 2015-01-19 MED ORDER — SODIUM CHLORIDE 0.9 % IV SOLN
INTRAVENOUS | Status: DC
  Administered 2015-01-19: 09:00:00 via INTRAVENOUS

## 2015-01-19 MED ORDER — DIPHENHYDRAMINE HCL 50 MG/ML IJ SOLN
INTRAMUSCULAR | Status: AC
  Filled 2015-01-19: qty 1

## 2015-01-19 MED ORDER — DIPHENHYDRAMINE HCL 50 MG/ML IJ SOLN
25.0000 mg | Freq: Once | INTRAMUSCULAR | Status: AC
  Administered 2015-01-19: 25 mg via INTRAVENOUS

## 2015-01-19 MED ORDER — METHYLPREDNISOLONE SODIUM SUCC 125 MG IJ SOLR
100.0000 mg | Freq: Once | INTRAMUSCULAR | Status: AC
  Administered 2015-01-19: 100 mg via INTRAVENOUS

## 2015-01-19 MED ORDER — METHYLPREDNISOLONE SODIUM SUCC 125 MG IJ SOLR
INTRAMUSCULAR | Status: AC
  Filled 2015-01-19: qty 2

## 2015-01-19 MED ORDER — SODIUM CHLORIDE 0.9 % IV SOLN
1000.0000 mg | Freq: Once | INTRAVENOUS | Status: AC
  Administered 2015-01-19: 1000 mg via INTRAVENOUS
  Filled 2015-01-19: qty 100

## 2015-01-20 ENCOUNTER — Encounter (HOSPITAL_COMMUNITY): Payer: Medicare Other

## 2015-01-26 DIAGNOSIS — Z6829 Body mass index (BMI) 29.0-29.9, adult: Secondary | ICD-10-CM | POA: Diagnosis not present

## 2015-01-26 DIAGNOSIS — R197 Diarrhea, unspecified: Secondary | ICD-10-CM | POA: Diagnosis not present

## 2015-01-26 DIAGNOSIS — E119 Type 2 diabetes mellitus without complications: Secondary | ICD-10-CM | POA: Diagnosis not present

## 2015-01-31 DIAGNOSIS — R197 Diarrhea, unspecified: Secondary | ICD-10-CM | POA: Diagnosis not present

## 2015-03-20 ENCOUNTER — Ambulatory Visit: Payer: Medicare Other | Admitting: Podiatry

## 2015-03-27 ENCOUNTER — Other Ambulatory Visit: Payer: Self-pay

## 2015-03-27 ENCOUNTER — Ambulatory Visit (INDEPENDENT_AMBULATORY_CARE_PROVIDER_SITE_OTHER): Payer: Medicare Other | Admitting: Podiatry

## 2015-03-27 ENCOUNTER — Encounter: Payer: Self-pay | Admitting: Podiatry

## 2015-03-27 DIAGNOSIS — M79676 Pain in unspecified toe(s): Secondary | ICD-10-CM | POA: Diagnosis not present

## 2015-03-27 DIAGNOSIS — B351 Tinea unguium: Secondary | ICD-10-CM

## 2015-03-27 NOTE — Progress Notes (Signed)
Patient ID: CAGNEY FALSO, female   DOB: Aug 16, 1940, 75 y.o.   MRN: [id number] Complaint:  Visit Type: Patient returns to my office for continued preventative foot care services. Complaint: Patient states" my nails have grown long and thick and become painful to walk and wear shoes" Patient has been diagnosed with RA.. The patient presents for preventative foot care services. No changes to ROS  Podiatric Exam: Vascular: dorsalis pedis and posterior tibial pulses are palpable bilateral. Capillary return is immediate. Temperature gradient is WNL. Skin turgor WNL  Sensorium: Normal Semmes Weinstein monofilament test. Normal tactile sensation bilaterally. Nail Exam: Pt has thick disfigured discolored nails with subungual debris noted bilateral entire nail hallux through fifth toenails Ulcer Exam: There is no evidence of ulcer or pre-ulcerative changes or infection. Orthopedic Exam: Muscle tone and strength are WNL. No limitations in general ROM. 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 No infection or ulcers.  Asymptomatic pinch callus right foot. Asymptomatic callus sub 2st metabase B/L  Diagnosis:  Onychomycosis, , Pain in right toe, pain in left toes  Treatment & Plan Procedures and Treatment: Consent by patient was obtained for treatment procedures. The patient understood the discussion of treatment and procedures well. All questions were answered thoroughly reviewed. Debridement of mycotic and hypertrophic toenails, 1 through 5 bilateral and clearing of subungual debris. No ulceration, no infection noted.  Return Visit-Office Procedure: Patient instructed to return to the office for a follow up visit 3 months for continued evaluation and treatment.   Gardiner Barefoot DPM

## 2015-03-28 DIAGNOSIS — M810 Age-related osteoporosis without current pathological fracture: Secondary | ICD-10-CM | POA: Diagnosis not present

## 2015-03-28 DIAGNOSIS — R197 Diarrhea, unspecified: Secondary | ICD-10-CM | POA: Diagnosis not present

## 2015-03-28 DIAGNOSIS — M069 Rheumatoid arthritis, unspecified: Secondary | ICD-10-CM | POA: Diagnosis not present

## 2015-03-28 DIAGNOSIS — Z6830 Body mass index (BMI) 30.0-30.9, adult: Secondary | ICD-10-CM | POA: Diagnosis not present

## 2015-03-28 DIAGNOSIS — E119 Type 2 diabetes mellitus without complications: Secondary | ICD-10-CM | POA: Diagnosis not present

## 2015-04-03 ENCOUNTER — Other Ambulatory Visit: Payer: Self-pay

## 2015-04-13 DIAGNOSIS — M0589 Other rheumatoid arthritis with rheumatoid factor of multiple sites: Secondary | ICD-10-CM | POA: Diagnosis not present

## 2015-04-13 DIAGNOSIS — M8580 Other specified disorders of bone density and structure, unspecified site: Secondary | ICD-10-CM | POA: Diagnosis not present

## 2015-04-13 DIAGNOSIS — M2550 Pain in unspecified joint: Secondary | ICD-10-CM | POA: Diagnosis not present

## 2015-04-13 DIAGNOSIS — Z79899 Other long term (current) drug therapy: Secondary | ICD-10-CM | POA: Diagnosis not present

## 2015-04-17 ENCOUNTER — Ambulatory Visit: Payer: Medicare Other | Admitting: Podiatry

## 2015-04-24 ENCOUNTER — Other Ambulatory Visit: Payer: Self-pay

## 2015-05-08 ENCOUNTER — Other Ambulatory Visit: Payer: Self-pay

## 2015-05-08 ENCOUNTER — Telehealth: Payer: Self-pay | Admitting: *Deleted

## 2015-05-08 DIAGNOSIS — Z1231 Encounter for screening mammogram for malignant neoplasm of breast: Secondary | ICD-10-CM

## 2015-05-08 NOTE — Telephone Encounter (Signed)
Please send chart information to [place] as there is no note in epic.

## 2015-05-08 NOTE — Telephone Encounter (Signed)
Please have patient use cetirizine 10mg  twice a day, ranitidine 150 twice a day and montelukast 10mg  one time per day.

## 2015-05-08 NOTE — Telephone Encounter (Signed)
Please get me chart

## 2015-05-08 NOTE — Telephone Encounter (Signed)
Last visit in [place] 2015.  No paper chart in [place].

## 2015-05-08 NOTE — Telephone Encounter (Signed)
PT started itching very bad last week. No hives, no redness to skin. She asked if she could double up on a medication or can we call in something to help?

## 2015-05-08 NOTE — Telephone Encounter (Signed)
Has a question about her medication. Would like a return call from nurse.

## 2015-05-09 NOTE — Telephone Encounter (Signed)
TRIED TO CALL PT LINE BUSY

## 2015-05-09 NOTE — Telephone Encounter (Signed)
FYI PT NOT SEEN BY DR K SINCE 205 WILL NEED APPT BEFORE MEDS CALLED OUT. DR NOT AWARE NOT SEEN WILL NEED APPT CAN USE OTC MEDS UNTIL SEEN

## 2015-05-09 NOTE — Telephone Encounter (Signed)
Tried to call pt line busy

## 2015-05-10 NOTE — Telephone Encounter (Signed)
LM FOR PT TO CALL OFFICE

## 2015-05-11 DIAGNOSIS — E119 Type 2 diabetes mellitus without complications: Secondary | ICD-10-CM | POA: Diagnosis not present

## 2015-05-11 NOTE — Telephone Encounter (Signed)
PATIENT ADVISED SHE TAKING ALL MEDS AND SX HAVE IMPROVED, ADVISED OVER YEAR SINCE LAST OV IF SX RESTART SHOULD CALL AND MAKE APPT

## 2015-05-12 ENCOUNTER — Other Ambulatory Visit (HOSPITAL_COMMUNITY): Payer: Self-pay | Admitting: *Deleted

## 2015-05-15 ENCOUNTER — Ambulatory Visit (HOSPITAL_COMMUNITY): Admission: RE | Admit: 2015-05-15 | Payer: Medicare Other | Source: Ambulatory Visit

## 2015-05-19 ENCOUNTER — Other Ambulatory Visit (HOSPITAL_COMMUNITY): Payer: Self-pay | Admitting: *Deleted

## 2015-05-22 ENCOUNTER — Ambulatory Visit (HOSPITAL_COMMUNITY)
Admission: RE | Admit: 2015-05-22 | Discharge: 2015-05-22 | Disposition: A | Discharge status: Home / Self Care | Payer: Medicare Other | Source: Ambulatory Visit | Attending: Physician Assistant | Admitting: Physician Assistant

## 2015-05-22 DIAGNOSIS — M069 Rheumatoid arthritis, unspecified: Secondary | ICD-10-CM | POA: Diagnosis not present

## 2015-05-22 DIAGNOSIS — Z79899 Other long term (current) drug therapy: Secondary | ICD-10-CM | POA: Diagnosis not present

## 2015-05-22 MED ORDER — METHYLPREDNISOLONE SODIUM SUCC 125 MG IJ SOLR
INTRAMUSCULAR | Status: AC
  Filled 2015-05-22: qty 2

## 2015-05-22 MED ORDER — SODIUM CHLORIDE 0.9 % IV SOLN
1000.0000 mg | INTRAVENOUS | Status: DC
  Administered 2015-05-22: 1000 mg via INTRAVENOUS
  Filled 2015-05-22: qty 100

## 2015-05-22 MED ORDER — SODIUM CHLORIDE 0.9 % IV SOLN: INTRAVENOUS | Status: DC

## 2015-05-22 MED ORDER — METHYLPREDNISOLONE SODIUM SUCC 125 MG IJ SOLR
100.0000 mg | INTRAMUSCULAR | Status: DC
  Administered 2015-05-22: 100 mg via INTRAVENOUS

## 2015-05-22 MED ORDER — DIPHENHYDRAMINE HCL 50 MG/ML IJ SOLN
INTRAMUSCULAR | Status: AC
  Filled 2015-05-22: qty 1

## 2015-05-22 MED ORDER — DIPHENHYDRAMINE HCL 50 MG/ML IJ SOLN
25.0000 mg | INTRAMUSCULAR | Status: DC
  Administered 2015-05-22: 25 mg via INTRAVENOUS

## 2015-05-22 NOTE — Progress Notes (Signed)
Pt stated she was having balance issues and had fallen twice in the last 3 months.  Called and reported it to Amy, RN with [facility].  Pt spoke with Leafy Kindle PA on the phone and explained to her it was the arthritis in her feet causing her balance issues.  Per Amy, RN Leafy Kindle PA gave the OK to administer the Rituxan today.

## 2015-05-25 DIAGNOSIS — M19011 Primary osteoarthritis, right shoulder: Secondary | ICD-10-CM | POA: Diagnosis not present

## 2015-05-25 DIAGNOSIS — M19012 Primary osteoarthritis, left shoulder: Secondary | ICD-10-CM | POA: Diagnosis not present

## 2015-05-29 ENCOUNTER — Ambulatory Visit: Payer: Medicare Other | Admitting: Podiatry

## 2015-05-29 DIAGNOSIS — M2022 Hallux rigidus, left foot: Secondary | ICD-10-CM | POA: Diagnosis not present

## 2015-05-29 DIAGNOSIS — M2042 Other hammer toe(s) (acquired), left foot: Secondary | ICD-10-CM | POA: Diagnosis not present

## 2015-05-29 DIAGNOSIS — M7742 Metatarsalgia, left foot: Secondary | ICD-10-CM | POA: Diagnosis not present

## 2015-06-01 ENCOUNTER — Ambulatory Visit: Payer: Medicare Other | Admitting: Sports Medicine

## 2015-06-05 ENCOUNTER — Encounter (HOSPITAL_COMMUNITY): Payer: Medicare Other

## 2015-06-07 ENCOUNTER — Encounter: Payer: Self-pay | Admitting: Sports Medicine

## 2015-06-07 ENCOUNTER — Ambulatory Visit (INDEPENDENT_AMBULATORY_CARE_PROVIDER_SITE_OTHER): Payer: Medicare Other | Admitting: Sports Medicine

## 2015-06-07 DIAGNOSIS — M2142 Flat foot [pes planus] (acquired), left foot: Secondary | ICD-10-CM

## 2015-06-07 DIAGNOSIS — M79676 Pain in unspecified toe(s): Secondary | ICD-10-CM | POA: Diagnosis not present

## 2015-06-07 DIAGNOSIS — E1142 Type 2 diabetes mellitus with diabetic polyneuropathy: Secondary | ICD-10-CM

## 2015-06-07 DIAGNOSIS — M19079 Primary osteoarthritis, unspecified ankle and foot: Secondary | ICD-10-CM

## 2015-06-07 DIAGNOSIS — M2040 Other hammer toe(s) (acquired), unspecified foot: Secondary | ICD-10-CM

## 2015-06-07 DIAGNOSIS — M21619 Bunion of unspecified foot: Secondary | ICD-10-CM

## 2015-06-07 DIAGNOSIS — B351 Tinea unguium: Secondary | ICD-10-CM

## 2015-06-07 DIAGNOSIS — M069 Rheumatoid arthritis, unspecified: Secondary | ICD-10-CM

## 2015-06-07 DIAGNOSIS — M2141 Flat foot [pes planus] (acquired), right foot: Secondary | ICD-10-CM

## 2015-06-07 NOTE — Progress Notes (Signed)
Patient ID: [name], female   DOB: 05-12-1940, 75 y.o.   MRN: [id number] Subjective: [name] is a 75 y.o. female patient with history of diabetes who presents to office today complaining of long, painful nails  while ambulating in shoes; unable to trim. Patient states that the glucose reading this morning was ok; unable to give #. Patient denies any new changes in medication or new problems. Patient denies any new cramping, numbness, burning or tingling in the legs; has baseline burning in right> left foot that is unchanged from prior. Admits that she is to have Left foot surgery with Dr. Oralia Rud to straighten out the toes on her left foot in April.   Patient Active Problem List   Diagnosis Date Noted  . Septic prepatellar bursitis of right knee 10/26/2014  . Prepatellar bursitis of right knee 08/28/2014  . DYSPHAGIA ORAL PHASE 06/18/2007  . ASTHMA 05/14/2007  . SLEEP APNEA 05/14/2007  . GERD 05/13/2007  . Rheumatoid arthritis ([place]) 05/13/2007  . OSTEOPOROSIS 05/13/2007  . DIARRHEA, CHRONIC 05/13/2007   Current Outpatient Prescriptions on File Prior to Visit  Medication Sig Dispense Refill  . atorvastatin (LIPITOR) 40 MG tablet Take 40 mg by mouth daily.     . cetirizine (ZYRTEC) 10 MG tablet Take 20 mg by mouth at bedtime.     . cholecalciferol (VITAMIN D) 1000 UNITS tablet Take 1,000 Units by mouth daily.    . Cyanocobalamin (B-12) 2500 MCG TABS Take 1 tablet by mouth daily.    Marland Kitchen doxycycline (VIBRA-TABS) 100 MG tablet Take 1 tablet (100 mg total) by mouth every 12 (twelve) hours. 20 tablet 0  . DULoxetine (CYMBALTA) 60 MG capsule Take 60 mg by mouth at bedtime.     Marland Kitchen HYDROcodone-acetaminophen (NORCO) 5-325 MG per tablet Take 1-2 tablets by mouth every 4 (four) hours as needed for severe pain. 50 tablet 0  . loperamide (IMODIUM) 2 MG capsule Take 4 mg by mouth as needed for diarrhea or loose stools.    . metformin (FORTAMET) 1000 MG (OSM) 24 hr tablet Take 1,000 mg by mouth daily.      . montelukast (SINGULAIR) 10 MG tablet Take 10 mg by mouth daily.     Marland Kitchen morphine (MSIR) 30 MG tablet Take 30 mg by mouth 2 (two) times daily as needed for moderate pain or severe pain.     . Multiple Vitamin (MULTIVITAMIN WITH MINERALS) TABS tablet Take 1 tablet by mouth daily.    . Omega-3 Fatty Acids (FISH OIL) 1200 MG CAPS Take 1 capsule by mouth daily.    Marland Kitchen oxyCODONE (OXY IR/ROXICODONE) 5 MG immediate release tablet Take 1-2 tablets (5-10 mg total) by mouth every 3 (three) hours as needed for breakthrough pain. 30 tablet 0  . pantoprazole (PROTONIX) 40 MG tablet Take 40 mg by mouth daily.    . predniSONE (DELTASONE) 5 MG tablet Take 5 mg by mouth daily.     . ranitidine (ZANTAC) 150 MG tablet Take 300 mg by mouth daily.     . riTUXimab (RITUXAN) 10 MG/ML injection 1 unit Intravenously every 6 months     No current facility-administered medications on file prior to visit.   Allergies  Allergen Reactions  . Penicillins Hives  . Sulfonamide Derivatives Nausea And Vomiting    REACTION: stomach problems    No results found for this or any previous visit (from the past 2160 hour(s)).  Objective: General: Patient is awake, alert, and oriented x 3 and in no  acute distress.  Integument: Skin is warm, dry and supple bilateral. Nails are tender, long, thickened and  dystrophic with subungual debris, consistent with onychomycosis, 1-5 bilateral. No signs of infection. No open lesions or preulcerative lesions present bilateral. Mild callus left medial midfoot plantar to TN joint. Old surgical scars bilateral that are well healed. Remaining integument unremarkable.  Vasculature:  Dorsalis Pedis pulse 1/4 bilateral. Posterior Tibial pulse  1/4 bilateral faint.  Capillary fill time <5 sec 1-5 bilateral. No hair growth to the level of the digits. Temperature gradient within normal limits. + varicosities present bilateral. No edema present bilateral.   Neurology: The patient has intact sensation  measured with a 5.07/10g Semmes Weinstein Monofilament at all pedal sites bilateral . Vibratory sensation slightly diminished bilateral with tuning fork at the level of the 1st MTPJs bilateral. No Babinski sign present bilateral.   Musculoskeletal: Significant pes planus end stage pes planus with dislocated digits/hammer toe and bunion with cross over; flaccid appearance to toes from prior surgeries  noted bilateral. There is syndactilazation at 4-5 toes on right from previous surgery, Diffuse significant arthritis most present at Midtarsal joint and ankle. Muscular strength acceptable in all lower extremity muscular groups bilateral without major pain on range of motion; there is limitation at all pedal joints . No tenderness with calf compression bilateral.  Assessment and Plan: Problem List Items Addressed This Visit      Musculoskeletal and Integument   Rheumatoid arthritis ([place])    Other Visit Diagnoses    Dermatophytosis of nail    -  Primary    Pain of toe, unspecified laterality        Pes planus of both feet        Bunion        Hammer toe, unspecified laterality        Osteoarthritis of ankle and foot, unspecified laterality        Diabetic polyneuropathy associated with type 2 diabetes mellitus ([place])          -Examined patient. -Discussed and educated patient on diabetic foot care, especially with  regards to the vascular, neurological and musculoskeletal systems.  -Stressed the importance of good glycemic control and the detriment of not  controlling glucose levels in relation to the foot. -Mechanically debrided all nails 1-5 bilateral using sterile nail nipper and filed with dremel without incident  -Patient declined debridement of callus at left medial midfoot -Recommend good supportive shoes for foot type daily -Answered all patient questions -Patient to return in 3 months for at risk foot care -Patient advised to call the office if any problems or questions arise in the  meantime.  Landis Martins, DPM

## 2015-06-09 ENCOUNTER — Other Ambulatory Visit: Payer: Self-pay | Admitting: Orthopedic Surgery

## 2015-06-19 ENCOUNTER — Ambulatory Visit
Admission: RE | Admit: 2015-06-19 | Discharge: 2015-06-19 | Disposition: A | Discharge status: Home / Self Care | Payer: Medicare Other | Source: Ambulatory Visit

## 2015-06-19 DIAGNOSIS — Z1231 Encounter for screening mammogram for malignant neoplasm of breast: Secondary | ICD-10-CM

## 2015-06-22 ENCOUNTER — Encounter (HOSPITAL_BASED_OUTPATIENT_CLINIC_OR_DEPARTMENT_OTHER): Admission: RE | Payer: Self-pay | Source: Ambulatory Visit

## 2015-06-22 ENCOUNTER — Ambulatory Visit (HOSPITAL_BASED_OUTPATIENT_CLINIC_OR_DEPARTMENT_OTHER): Admission: RE | Admit: 2015-06-22 | Payer: Medicare Other | Source: Ambulatory Visit | Admitting: Orthopedic Surgery

## 2015-06-22 SURGERY — FUSION, JOINT, GREAT TOE
Anesthesia: General | Site: Toe | Laterality: Left

## 2015-06-26 ENCOUNTER — Ambulatory Visit: Payer: Medicare Other

## 2015-08-22 ENCOUNTER — Ambulatory Visit: Payer: Medicare Other | Admitting: Allergy and Immunology

## 2015-08-23 DIAGNOSIS — L299 Pruritus, unspecified: Secondary | ICD-10-CM | POA: Diagnosis not present

## 2015-08-23 DIAGNOSIS — L300 Nummular dermatitis: Secondary | ICD-10-CM | POA: Diagnosis not present

## 2015-08-30 ENCOUNTER — Encounter: Payer: Self-pay | Admitting: Allergy and Immunology

## 2015-08-30 ENCOUNTER — Ambulatory Visit (INDEPENDENT_AMBULATORY_CARE_PROVIDER_SITE_OTHER): Payer: Medicare Other | Admitting: Allergy and Immunology

## 2015-08-30 VITALS — BP 120/80 | HR 88 | Resp 16 | Ht 64.45 in | Wt 200.4 lb

## 2015-08-30 DIAGNOSIS — Z79899 Other long term (current) drug therapy: Secondary | ICD-10-CM | POA: Diagnosis not present

## 2015-08-30 DIAGNOSIS — L509 Urticaria, unspecified: Secondary | ICD-10-CM

## 2015-08-30 DIAGNOSIS — J4541 Moderate persistent asthma with (acute) exacerbation: Secondary | ICD-10-CM | POA: Diagnosis not present

## 2015-08-30 DIAGNOSIS — Z79620 Long term (current) use of immunosuppressive biologic: Secondary | ICD-10-CM

## 2015-08-30 DIAGNOSIS — R05 Cough: Secondary | ICD-10-CM | POA: Diagnosis not present

## 2015-08-30 MED ORDER — MONTELUKAST SODIUM 10 MG PO TABS: ORAL_TABLET | ORAL | Status: DC

## 2015-08-30 MED ORDER — ALBUTEROL SULFATE 108 (90 BASE) MCG/ACT IN AEPB: 2.0000 | INHALATION_SPRAY | RESPIRATORY_TRACT | Status: DC | PRN

## 2015-08-30 MED ORDER — FLUTICASONE FUROATE-VILANTEROL 200-25 MCG/INH IN AEPB: 1.0000 | INHALATION_SPRAY | Freq: Every day | RESPIRATORY_TRACT | Status: DC

## 2015-08-30 MED ORDER — CETIRIZINE HCL 10 MG PO TABS: ORAL_TABLET | ORAL | Status: DC

## 2015-08-30 MED ORDER — RANITIDINE HCL 150 MG PO TABS: ORAL_TABLET | ORAL | Status: DC

## 2015-08-30 NOTE — Patient Instructions (Addendum)
  1. Every day use the following medications:   A. cetirizine 10 mg twice a day  B. ranitidine 150 mg twice a day  C. montelukast 10 mg daily  D. Breo 200 1 inhalation one time per day  2. If needed:   A. can add OTC Benadryl  B. ProAir Respiclick 2 puffs every 4-6 hours  3. Blood - CBC with differential, IgA/G/M, IgE  4. Obtain a chest x-ray  5. Return to clinic in 3 weeks or earlier if problem

## 2015-08-30 NOTE — Progress Notes (Signed)
Follow-up Note  Referring Provider: [name], MD Primary Provider: Jerlyn Ly, MD Date of Office Visit: 08/30/2015  Subjective:   [name] (DOB: [birth year]) is a 75 y.o. female who returns to the [facility] on 08/30/2015 in re-evaluation of the following:  HPI: [name] presents to this clinic in evaluation of urticaria. I have last seen her in his clinic several years back at which time she had a history of chronic urticaria under very good control while using cetirizine and ranitidine one time per day. Unfortunately about 4 weeks ago she had diffuse urticaria and itchiness for which she went to see the dermatologist and was treated with Kenalog injection last week which has helped her tremendously. It should be noted that regarding associated systemic or constitutional symptoms she has been having a cough for the past month as well. She's been having associated wheezing. She'll wake up in the morning with phlegm in her throat that is sometimes yellow. All these symptoms are better as well since she received Kenalog. She is not really had a significant environmental change or any other associated systemic or constitutional symptoms.  It should be noted that she is being treated with rituximab for rheumatoid arthritis and had her last injection about 4 months ago.    Medication List           atorvastatin 40 MG tablet  Commonly known as:  LIPITOR  Take 40 mg by mouth daily.     B-12 2500 MCG Tabs  Take 1 tablet by mouth daily.     cetirizine 10 MG tablet  Commonly known as:  ZYRTEC  Take 20 mg by mouth at bedtime.     cholecalciferol 1000 units tablet  Commonly known as:  VITAMIN D  Take 1,000 Units by mouth daily.     DULoxetine 60 MG capsule  Commonly known as:  CYMBALTA  Take 60 mg by mouth at bedtime.     Fish Oil 1200 MG Caps  Take 1 capsule by mouth daily.     loperamide 2 MG capsule  Commonly known as:  IMODIUM  Take 4 mg by mouth as  needed for diarrhea or loose stools.     metformin 1000 MG (OSM) 24 hr tablet  Commonly known as:  FORTAMET  Take 1,000 mg by mouth daily.     montelukast 10 MG tablet  Commonly known as:  SINGULAIR  Take 10 mg by mouth daily.     morphine 30 MG tablet  Commonly known as:  MSIR  Take 30 mg by mouth 2 (two) times daily as needed for moderate pain or severe pain.     multivitamin with minerals Tabs tablet  Take 1 tablet by mouth daily.     pantoprazole 40 MG tablet  Commonly known as:  PROTONIX  Take 40 mg by mouth daily.     predniSONE 5 MG tablet  Commonly known as:  DELTASONE  Take 5 mg by mouth daily.     ranitidine 150 MG tablet  Commonly known as:  ZANTAC  Take 300 mg by mouth daily.     RITUXAN 10 MG/ML injection  Generic drug:  riTUXimab  1 unit Intravenously every 6 months        Past Medical History  Diagnosis Date  . Asthma   . Osteoporosis   . Diabetes mellitus without complication ([place])   . Rheumatoid arthritis(714.0)   . Sleep apnea     no cpap  .  Emphysema   . Fibromyalgia   . Complication of anesthesia     Anesthesia told her years ago she got too cold during surgery    Past Surgical History  Procedure Laterality Date  . 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 Foot surgery      numerous  . Ankle fracture surgery  2007  . Partial hysterectomy  1970  . Knee bursectomy Right 09/16/2014    Procedure: EXCISON OF PRE PATELLA BURSA RIGHT KNEE ;  Surgeon: Gaynelle Arabian, MD;  Location: [facility];  Service: Orthopedics;  Laterality: Right;  . Abdominal hysterectomy    . Breast surgery      mammoplasty and then removed  . I&d extremity Right 10/26/2014    Procedure: IRRIGATION AND DEBRIDEMENT RIGHT KNEE ;  Surgeon: Gaynelle Arabian, MD;  Location: [facility];  Service: Orthopedics;  Laterality: Right;    Allergies  Allergen Reactions  . Penicillins Hives  . Sulfonamide Derivatives Nausea And Vomiting    REACTION: stomach problems    Review of  systems negative except as noted in HPI / PMHx or noted below:  Review of Systems  Constitutional: Negative.   HENT: Negative.   Eyes: Negative.   Respiratory: Negative.   Cardiovascular: Negative.   Gastrointestinal: Negative.   Genitourinary: Negative.   Musculoskeletal: Negative.   Skin: Negative.   Neurological: Negative.   Endo/Heme/Allergies: Negative.   Psychiatric/Behavioral: Negative.      Objective:   Filed Vitals:   08/30/15 1445  BP: 120/80  Pulse: 88  Resp: 16   Height: 5' 4.45" (163.7 cm)  Weight: 200 lb 6.4 oz (90.9 kg)   Physical Exam  Constitutional: She is well-developed, well-nourished, and in no distress.  HENT:  Head: Normocephalic.  Right Ear: Tympanic membrane, external ear and ear canal normal.  Left Ear: Tympanic membrane, external ear and ear canal normal.  Nose: Nose normal. No mucosal edema or rhinorrhea.  Mouth/Throat: Uvula is midline, oropharynx is clear and moist and mucous membranes are normal. No oropharyngeal exudate.  Eyes: Conjunctivae are normal.  Neck: Trachea normal. No tracheal tenderness present. No tracheal deviation present. No thyromegaly present.  Cardiovascular: Normal rate, regular rhythm, S1 normal, S2 normal and normal heart sounds.   No murmur heard. Pulmonary/Chest: No stridor. No respiratory distress. She has wheezes (Expiratory wheezes). She has no rales.  Musculoskeletal: She exhibits no edema.  Lymphadenopathy:       Head (right side): No tonsillar adenopathy present.       Head (left side): No tonsillar adenopathy present.    She has no cervical adenopathy.  Neurological: She is alert. Gait normal.  Skin: No rash noted. She is not diaphoretic. No erythema. Nails show no clubbing.  Psychiatric: Mood and affect normal.    Diagnostics:    Spirometry was performed and demonstrated an FEV1 of 2.20 at 102 % of predicted. Following the administration of nebulized albuterol her FEV1 rose to 2.32 which was an  increase in the FEV1 of 5%.  The patient had an Asthma Control Test with the following results:  .    Assessment and Plan:   1. Asthma, not well controlled, moderate persistent, with acute exacerbation   2. Urticaria   3. On rituximab therapy     1. Every day use the following medications:   A. cetirizine 10 mg twice a day  B. ranitidine 150 mg twice a day  C. montelukast 10 mg daily  D. Symbicort 160  2 inhalations twice a day with spacer  2. If needed:   A. can add OTC Benadryl  B. Proventil HFA 2 puffs every 4-6 hours  3. Blood - CBC with differential, IgA/G/M, IgE  4. Obtain a chest x-ray  5. Return to clinic in 3 weeks or earlier if problem  I'm going to treat [place] for inflammation of her respiratory tract utilizing anti-inflammatory medications as specified above while she continues to use therapy to hopefully control her urticaria. 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 We'll obtain a chest x-ray and look at her immunoglobulin levels as noted above. I'll see her back in his clinic in a few weeks.  Allena Katz, MD [facility]

## 2015-08-31 LAB — CBC WITH DIFFERENTIAL/PLATELET
Basophils Absolute: 0.1 10*3/uL (ref 0.0–0.2)
Basos: 1 %
EOS (ABSOLUTE): 0.3 10*3/uL (ref 0.0–0.4)
Eos: 2 %
HEMATOCRIT: 41.2 % (ref 34.0–46.6)
HEMOGLOBIN: 13.3 g/dL (ref 11.1–15.9)
IMMATURE GRANS (ABS): 0.1 10*3/uL (ref 0.0–0.1)
IMMATURE GRANULOCYTES: 1 %
LYMPHS: 21 %
Lymphocytes Absolute: 3.7 10*3/uL — ABNORMAL HIGH (ref 0.7–3.1)
MCH: 26.8 pg (ref 26.6–33.0)
MCHC: 32.3 g/dL (ref 31.5–35.7)
MCV: 83 fL (ref 79–97)
MONOCYTES: 6 %
Monocytes Absolute: 1.1 10*3/uL — ABNORMAL HIGH (ref 0.1–0.9)
NEUTROS ABS: 12.4 10*3/uL — AB (ref 1.4–7.0)
NEUTROS PCT: 69 %
PLATELETS: 379 10*3/uL (ref 150–379)
RBC: 4.96 x10E6/uL (ref 3.77–5.28)
RDW: 15.1 % (ref 12.3–15.4)
WBC: 17.6 10*3/uL — ABNORMAL HIGH (ref 3.4–10.8)

## 2015-08-31 LAB — IGG, IGA, IGM
IGM (IMMUNOGLOBULIN M), SRM: 35 mg/dL (ref 26–217)
IgA/Immunoglobulin A, Serum: 155 mg/dL (ref 64–422)
IgG (Immunoglobin G), Serum: 544 mg/dL — ABNORMAL LOW (ref 700–1600)

## 2015-09-04 ENCOUNTER — Telehealth: Payer: Self-pay | Admitting: *Deleted

## 2015-09-04 DIAGNOSIS — D849 Immunodeficiency, unspecified: Secondary | ICD-10-CM | POA: Diagnosis not present

## 2015-09-04 NOTE — Telephone Encounter (Signed)
CALLED PATIENT ADVISED OF LAB TEST RESULTS SHOWED LOW  IGG PER [facility] GET PNEUMOCOCCAL ANTIBODIES DRAWN AND GET PNEUMOVAX AND REPEAT TITER SIN 4 WEEKS. ORDERS SENT TO [facility] AND RESULTS TO PCP.  ALSO ADVISED PATIENT OF CHEST XRAY OK SHOWS SIGNS OF ASTHMA / EMPHYSEMA

## 2015-09-05 DIAGNOSIS — E785 Hyperlipidemia, unspecified: Secondary | ICD-10-CM | POA: Diagnosis not present

## 2015-09-05 DIAGNOSIS — Z0000 Encounter for general adult medical examination without abnormal findings: Secondary | ICD-10-CM | POA: Diagnosis not present

## 2015-09-05 DIAGNOSIS — M810 Age-related osteoporosis without current pathological fracture: Secondary | ICD-10-CM | POA: Diagnosis not present

## 2015-09-05 DIAGNOSIS — E119 Type 2 diabetes mellitus without complications: Secondary | ICD-10-CM | POA: Diagnosis not present

## 2015-09-07 ENCOUNTER — Ambulatory Visit: Payer: Medicare Other | Admitting: Sports Medicine

## 2015-09-15 DIAGNOSIS — R197 Diarrhea, unspecified: Secondary | ICD-10-CM | POA: Diagnosis not present

## 2015-09-15 DIAGNOSIS — Z0000 Encounter for general adult medical examination without abnormal findings: Secondary | ICD-10-CM | POA: Diagnosis not present

## 2015-09-15 DIAGNOSIS — E784 Other hyperlipidemia: Secondary | ICD-10-CM | POA: Diagnosis not present

## 2015-09-15 DIAGNOSIS — M069 Rheumatoid arthritis, unspecified: Secondary | ICD-10-CM | POA: Diagnosis not present

## 2015-09-15 DIAGNOSIS — M5380 Other specified dorsopathies, site unspecified: Secondary | ICD-10-CM | POA: Diagnosis not present

## 2015-09-15 DIAGNOSIS — Z23 Encounter for immunization: Secondary | ICD-10-CM | POA: Diagnosis not present

## 2015-09-15 DIAGNOSIS — E119 Type 2 diabetes mellitus without complications: Secondary | ICD-10-CM | POA: Diagnosis not present

## 2015-09-15 DIAGNOSIS — J449 Chronic obstructive pulmonary disease, unspecified: Secondary | ICD-10-CM | POA: Diagnosis not present

## 2015-09-15 DIAGNOSIS — Z6831 Body mass index (BMI) 31.0-31.9, adult: Secondary | ICD-10-CM | POA: Diagnosis not present

## 2015-09-15 DIAGNOSIS — Z1389 Encounter for screening for other disorder: Secondary | ICD-10-CM | POA: Diagnosis not present

## 2015-09-15 DIAGNOSIS — F329 Major depressive disorder, single episode, unspecified: Secondary | ICD-10-CM | POA: Diagnosis not present

## 2015-09-15 DIAGNOSIS — D801 Nonfamilial hypogammaglobulinemia: Secondary | ICD-10-CM | POA: Diagnosis not present

## 2015-09-15 DIAGNOSIS — J45998 Other asthma: Secondary | ICD-10-CM | POA: Diagnosis not present

## 2015-09-15 LAB — PNEUMOCOCCAL IM (14 SEROTYPE)
PNEUMO AB TYPE 14: 6.4 ug/mL (ref >1.3)
PNEUMO AB TYPE 19 (19F): 0.5 ug/mL — AB (ref >1.3)
PNEUMO AB TYPE 23 (23F): 0.7 ug/mL — AB (ref >1.3)
PNEUMO AB TYPE 51 (7F): 0.5 ug/mL — AB (ref >1.3)
PNEUMO AB TYPE 8: 0.5 ug/mL — AB (ref >1.3)
PNEUMO AB TYPE 9 (9N): 0.5 ug/mL — AB (ref >1.3)
Pneumo Ab Type 3*: 0.5 ug/mL — ABNORMAL LOW (ref >1.3)
Pneumo Ab Type 4*: <0.3 ug/mL — ABNORMAL LOW (ref >1.3)
Pneumo Ab Type 56 (18C)*: 0.4 ug/mL — ABNORMAL LOW (ref >1.3)
Pneumo Ab Type 57 (19A)*: 5.4 ug/mL (ref >1.3)

## 2015-09-20 ENCOUNTER — Encounter: Payer: Self-pay | Admitting: Allergy and Immunology

## 2015-09-20 ENCOUNTER — Ambulatory Visit (INDEPENDENT_AMBULATORY_CARE_PROVIDER_SITE_OTHER): Payer: Medicare Other | Admitting: Allergy and Immunology

## 2015-09-20 VITALS — BP 128/84 | HR 104 | Resp 20

## 2015-09-20 DIAGNOSIS — J4541 Moderate persistent asthma with (acute) exacerbation: Secondary | ICD-10-CM

## 2015-09-20 DIAGNOSIS — Z79620 Long term (current) use of immunosuppressive biologic: Secondary | ICD-10-CM

## 2015-09-20 DIAGNOSIS — K219 Gastro-esophageal reflux disease without esophagitis: Secondary | ICD-10-CM

## 2015-09-20 DIAGNOSIS — J387 Other diseases of larynx: Secondary | ICD-10-CM | POA: Diagnosis not present

## 2015-09-20 DIAGNOSIS — D801 Nonfamilial hypogammaglobulinemia: Secondary | ICD-10-CM | POA: Diagnosis not present

## 2015-09-20 DIAGNOSIS — L509 Urticaria, unspecified: Secondary | ICD-10-CM | POA: Diagnosis not present

## 2015-09-20 DIAGNOSIS — Z79899 Other long term (current) drug therapy: Secondary | ICD-10-CM | POA: Diagnosis not present

## 2015-09-20 MED ORDER — DEXLANSOPRAZOLE 60 MG PO CPDR: 60.0000 mg | DELAYED_RELEASE_CAPSULE | Freq: Every day | ORAL | Status: DC

## 2015-09-20 NOTE — Progress Notes (Signed)
Follow-up Note  Referring Provider: [name], MD Primary Provider: Jerlyn Ly, MD Date of Office Visit: 09/20/2015  Subjective:   [name] (DOB: 12-24-1940) is a 75 y.o. female who returns to the [facility] [place] on 09/20/2015 in re-evaluation of the following:  HPI: Arion returns to this clinic in reevaluation of her urticaria and respiratory tract symptoms. I last saw her in his clinic on 31 August 2015 at which time we started her on anti-inflammatory medications for her lower respiratory tract assuming that she may have had a component of asthma and had her continue to use therapy directed against her urticaria with the use of cetirizine and montelukast and ranitidine and obtained a chest x-ray and some blood tests in investigation of her recurrent respiratory tract problems.  Her urticaria is not an issue. Her acute flare of her urticaria has finally resolved. She is doing very well from that standpoint and continues to use medical therapy consistently.  She still continues to have a cough that's been going on for over a month. She still has phlegm in her throat. She is better but still continues to have some symptomatology with coughing and wheezing and sputum production.  As well, she still has issues with reflux with nocturnal regurgitation and she has developed throat clearing and raspy voice over the course of the past several months.  Because she is being treated with rituximab for her rheumatoid arthritis and has had development of respiratory tract symptoms I did check her immunoglobulin levels and indeed she did appear to have a relatively low IgG and she had low levels of titers directed against various serotypes of pneumococcus. I asked her to get a Pneumovax at Dr. Len Childs office which she has done and we are going to check her post Pneumovax pneumococcal titers in 4 weeks.    Medication List           Albuterol Sulfate 108 (90 Base) MCG/ACT Aepb    Commonly known as:  PROAIR RESPICLICK  Inhale 2 puffs into the lungs every 4 (four) hours as needed.     atorvastatin 40 MG tablet  Commonly known as:  LIPITOR  Take 40 mg by mouth daily.     B-12 2500 MCG Tabs  Take 1 tablet by mouth daily.     cetirizine 10 MG tablet  Commonly known as:  ZYRTEC  TAKE ONE TABLET TWICE DAILY     cholecalciferol 1000 units tablet  Commonly known as:  VITAMIN D  Take 1,000 Units by mouth daily.     DULoxetine 60 MG capsule  Commonly known as:  CYMBALTA  Take 60 mg by mouth at bedtime.     Fish Oil 1200 MG Caps  Take 1 capsule by mouth daily.     fluticasone furoate-vilanterol 200-25 MCG/INH Aepb  Commonly known as:  BREO ELLIPTA  Inhale 1 puff into the lungs daily.     loperamide 2 MG capsule  Commonly known as:  IMODIUM  Take 4 mg by mouth as needed for diarrhea or loose stools.     metformin 1000 MG (OSM) 24 hr tablet  Commonly known as:  FORTAMET  Take 1,000 mg by mouth daily.     montelukast 10 MG tablet  Commonly known as:  SINGULAIR  TAKE ONE TABLET ONCE DAILY     morphine 30 MG tablet  Commonly known as:  MSIR  Take 30 mg by mouth 2 (two) times daily as needed for moderate  pain or severe pain.     multivitamin with minerals Tabs tablet  Take 1 tablet by mouth daily.     pantoprazole 40 MG tablet  Commonly known as:  PROTONIX  Take 40 mg by mouth daily.     predniSONE 5 MG tablet  Commonly known as:  DELTASONE  Take 5 mg by mouth daily.     ranitidine 150 MG tablet  Commonly known as:  ZANTAC  TAKE ONE TABLET TWICE DAILY     RITUXAN 10 MG/ML injection  Generic drug:  riTUXimab  1 unit Intravenously every 6 months        Past Medical History  Diagnosis Date  . Asthma   . Osteoporosis   . Diabetes mellitus without complication ([place])   . Rheumatoid arthritis(714.0)   . Sleep apnea     no cpap  . Emphysema   . Fibromyalgia   . Complication of anesthesia     Anesthesia told her years ago she got too cold  during surgery    Past Surgical History  Procedure Laterality Date  . 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 Foot surgery      numerous  . Ankle fracture surgery  2007  . Partial hysterectomy  1970  . Knee bursectomy Right 09/16/2014    Procedure: EXCISON OF PRE PATELLA BURSA RIGHT KNEE ;  Surgeon: Gaynelle Arabian, MD;  Location: [facility];  Service: Orthopedics;  Laterality: Right;  . Abdominal hysterectomy    . Breast surgery      mammoplasty and then removed  . I&d extremity Right 10/26/2014    Procedure: IRRIGATION AND DEBRIDEMENT RIGHT KNEE ;  Surgeon: Gaynelle Arabian, MD;  Location: [facility];  Service: Orthopedics;  Laterality: Right;    Allergies  Allergen Reactions  . Penicillins Hives  . Sulfonamide Derivatives Nausea And Vomiting    REACTION: stomach problems    Review of systems negative except as noted in HPI / PMHx or noted below:  Review of Systems  Constitutional: Negative.   HENT: Negative.   Eyes: Negative.   Respiratory: Negative.   Cardiovascular: Negative.   Gastrointestinal: Negative.   Genitourinary: Negative.   Musculoskeletal: Negative.   Skin: Negative.   Neurological: Negative.   Endo/Heme/Allergies: Negative.   Psychiatric/Behavioral: Negative.      Objective:   Filed Vitals:   09/20/15 1353  BP: 128/84  Pulse: 104  Resp: 20          Physical Exam  Constitutional: She is well-developed, well-nourished, and in no distress.  Slightly raspy voice  HENT:  Head: Normocephalic.  Right Ear: Tympanic membrane, external ear and ear canal normal.  Left Ear: Tympanic membrane, external ear and ear canal normal.  Nose: Nose normal. No mucosal edema or rhinorrhea.  Mouth/Throat: Uvula is midline, oropharynx is clear and moist and mucous membranes are normal. No oropharyngeal exudate.  Eyes: Conjunctivae are normal.  Neck: Trachea normal. No tracheal tenderness present. No tracheal deviation present. No thyromegaly present.  Cardiovascular:  Normal rate, regular rhythm, S1 normal, S2 normal and normal heart sounds.   No murmur heard. Pulmonary/Chest: Breath sounds normal. No stridor. No respiratory distress. She has no wheezes. She has no rales.  Musculoskeletal: She exhibits no edema.  Lymphadenopathy:       Head (right side): No tonsillar adenopathy present.       Head (left side): No tonsillar adenopathy present.    She has no cervical adenopathy.  Neurological: She  is alert. Gait normal.  Skin: No rash noted. She is not diaphoretic. No erythema. Nails show no clubbing.  Psychiatric: Mood and affect normal.    Diagnostics:    Spirometry was performed and demonstrated an FEV1 of 2.27 at 105 % of predicted.  The patient had an Asthma Control Test with the following results: ACT Total Score: 13.    Results of blood tests obtained on 08/30/2015 identified a white blood cell count of 17.6 with a slight left shift and an absolute eosinophil count of 300, a hemoglobin of 13.3 with an MCV of 89 and platelet count 379. Her IgG was 544, IgA 155, IgM 35 MG/DL.Marland Kitchen She had very low titers of antibodies directed against various serotypes of pneumococcus. There only 2 out of 14 serotypes in which she had adequate antibody levels.  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 On rituximab therapy   3. Asthma, not well controlled, moderate persistent, with acute exacerbation   4. Urticaria   5. LPRD (laryngopharyngeal reflux disease)     1. Every day use the following medications:   A. cetirizine 10 mg twice a day  B. ranitidine 150 mg two tablets in PM  C. montelukast 10 mg daily  D. Breo 200 1 inhalation one time per day  E. Dexilant 60 one tablet in AM  2. If needed:   A. can add OTC Benadryl  B. ProAir Respiclick 2 puffs every 4-6 hours  3. Return to clinic in 4 weeks or earlier if problem  [place] still continues to have some issues with her respiratory tract and this may be secondary to reflux as well as  eosinophilic driven inflammation and I'm going to have her be a little bit more aggressive about treating her reflux by using a combination of Dexilant and ranitidine while she continues to use anti-inflammatory medications for her respiratory tract. It is quite possible that she is having some low-grade bacterial driven inflammation of her respiratory tract because of her mild hypogammaglobulinemia. This condition may be secondary to the rituximab use or possibly her prolonged low dose steroid use. What is of concern is the fact that she has very low titers of antibodies against pneumococcus and we need to see if she can [facility] a antigen specific antibody response by checking her response to the Pneumovax. If she does not demonstrate any response to Pneumovax then she probably will require immunoglobulin infusion. If this is the case it is probably the rituximab that is driving this degree of immunosuppression. Unfortunately, she has been treated with a multitude of different medications including several biological agents for her rheumatoid arthritis and it is only the rituximab that has made a significant impact on her disease state and thus I think we may be stuck in a situation where she is going to continue to use rituximab and use antibody infusions for one of the side effects precipitated by the use of her rituximab. We should have much more information over the course of the next 4 weeks about how to proceed.  Allena Katz, MD [place]

## 2015-09-20 NOTE — Patient Instructions (Addendum)
  1. Every day use the following medications:   A. cetirizine 10 mg twice a day  B. ranitidine 150 mg two tablets in PM  C. montelukast 10 mg daily  D. Breo 200 1 inhalation one time per day  E. Dexilant 60 one tablet in AM  2. If needed:   A. can add OTC Benadryl  B. ProAir Respiclick 2 puffs every 4-6 hours  3. Return to clinic in 4 weeks or earlier if problem

## 2015-09-21 ENCOUNTER — Other Ambulatory Visit: Payer: Self-pay | Admitting: *Deleted

## 2015-09-21 MED ORDER — RANITIDINE HCL 150 MG PO TABS: ORAL_TABLET | ORAL | Status: DC

## 2015-09-25 ENCOUNTER — Encounter: Payer: Self-pay | Admitting: Podiatry

## 2015-09-25 ENCOUNTER — Ambulatory Visit (INDEPENDENT_AMBULATORY_CARE_PROVIDER_SITE_OTHER): Payer: Medicare Other | Admitting: Podiatry

## 2015-09-25 DIAGNOSIS — Q828 Other specified congenital malformations of skin: Secondary | ICD-10-CM

## 2015-09-25 DIAGNOSIS — M069 Rheumatoid arthritis, unspecified: Secondary | ICD-10-CM

## 2015-09-25 DIAGNOSIS — B351 Tinea unguium: Secondary | ICD-10-CM | POA: Diagnosis not present

## 2015-09-25 DIAGNOSIS — M79676 Pain in unspecified toe(s): Secondary | ICD-10-CM

## 2015-09-25 DIAGNOSIS — E1142 Type 2 diabetes mellitus with diabetic polyneuropathy: Secondary | ICD-10-CM

## 2015-09-25 NOTE — Progress Notes (Signed)
Patient ID: [name], female   DOB: 07/03/1940, 75 y.o.   MRN: [id number] Complaint:  Visit Type: Patient returns to my office for continued preventative foot care services. Complaint: Patient states" my nails have grown long and thick and become painful to walk and wear shoes" Patient has been diagnosed with RA.. The patient presents for preventative foot care services. Patient says callus has become painful left foot.  Podiatric Exam: Vascular: dorsalis pedis and posterior tibial pulses are palpable bilateral. Capillary return is immediate. Temperature gradient is WNL. Skin turgor WNL  Sensorium: Normal Semmes Weinstein monofilament test. Normal tactile sensation bilaterally. Nail Exam: Pt has thick disfigured discolored nails with subungual debris noted bilateral entire nail hallux through fifth toenails Ulcer Exam: There is no evidence of ulcer or pre-ulcerative changes or infection. Orthopedic Exam: Muscle tone and strength are WNL. No limitations in general ROM. 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 No infection or ulcers.  Asymptomatic pinch callus right foot.  Callus medial midfoot left.  Callus second toe left foot.  Diagnosis:  Onychomycosis, , Pain in right toe, pain in left toes.  Porokeratosis left foot.  Treatment & Plan Procedures and Treatment: Consent by patient was obtained for treatment procedures. The patient understood the discussion of treatment and procedures well. All questions were answered thoroughly reviewed. Debridement of mycotic and hypertrophic toenails, 1 through 5 bilateral and clearing of subungual debris. No ulceration, no infection noted. Debride porokeratosis. Return Visit-Office Procedure: Patient instructed to return to the office for a follow up visit 3 months for continued evaluation and treatment.   Gardiner Barefoot  DPM

## 2015-09-26 DIAGNOSIS — M0589 Other rheumatoid arthritis with rheumatoid factor of multiple sites: Secondary | ICD-10-CM | POA: Diagnosis not present

## 2015-09-26 DIAGNOSIS — M8580 Other specified disorders of bone density and structure, unspecified site: Secondary | ICD-10-CM | POA: Diagnosis not present

## 2015-09-26 DIAGNOSIS — M2550 Pain in unspecified joint: Secondary | ICD-10-CM | POA: Diagnosis not present

## 2015-09-26 DIAGNOSIS — Z79899 Other long term (current) drug therapy: Secondary | ICD-10-CM | POA: Diagnosis not present

## 2015-09-26 DIAGNOSIS — R5383 Other fatigue: Secondary | ICD-10-CM | POA: Diagnosis not present

## 2015-09-26 DIAGNOSIS — E538 Deficiency of other specified B group vitamins: Secondary | ICD-10-CM | POA: Diagnosis not present

## 2015-10-10 DIAGNOSIS — G4733 Obstructive sleep apnea (adult) (pediatric): Secondary | ICD-10-CM | POA: Diagnosis not present

## 2015-10-10 DIAGNOSIS — M0579 Rheumatoid arthritis with rheumatoid factor of multiple sites without organ or systems involvement: Secondary | ICD-10-CM | POA: Diagnosis not present

## 2015-10-10 DIAGNOSIS — D801 Nonfamilial hypogammaglobulinemia: Secondary | ICD-10-CM | POA: Diagnosis not present

## 2015-10-18 ENCOUNTER — Encounter: Payer: Self-pay | Admitting: Allergy and Immunology

## 2015-10-18 ENCOUNTER — Ambulatory Visit (INDEPENDENT_AMBULATORY_CARE_PROVIDER_SITE_OTHER): Payer: Medicare Other | Admitting: Allergy and Immunology

## 2015-10-18 VITALS — BP 118/78 | HR 100 | Resp 18

## 2015-10-18 DIAGNOSIS — D801 Nonfamilial hypogammaglobulinemia: Secondary | ICD-10-CM

## 2015-10-18 DIAGNOSIS — Z79620 Long term (current) use of immunosuppressive biologic: Secondary | ICD-10-CM

## 2015-10-18 DIAGNOSIS — J387 Other diseases of larynx: Secondary | ICD-10-CM

## 2015-10-18 DIAGNOSIS — G4730 Sleep apnea, unspecified: Secondary | ICD-10-CM

## 2015-10-18 DIAGNOSIS — Z79899 Other long term (current) drug therapy: Secondary | ICD-10-CM

## 2015-10-18 DIAGNOSIS — R5383 Other fatigue: Secondary | ICD-10-CM

## 2015-10-18 DIAGNOSIS — K219 Gastro-esophageal reflux disease without esophagitis: Secondary | ICD-10-CM

## 2015-10-18 DIAGNOSIS — J4540 Moderate persistent asthma, uncomplicated: Secondary | ICD-10-CM | POA: Diagnosis not present

## 2015-10-18 DIAGNOSIS — D849 Immunodeficiency, unspecified: Secondary | ICD-10-CM

## 2015-10-18 MED ORDER — PANTOPRAZOLE SODIUM 40 MG PO TBEC: DELAYED_RELEASE_TABLET | ORAL | 5 refills | Status: DC

## 2015-10-18 NOTE — Patient Instructions (Signed)
  1. Every day use the following medications:   A. change cetirizine to loratadine 10 mg twice a day  B. ranitidine 150 mg two tablets in PM  C. montelukast 10 mg daily  D. Breo 200 1 inhalation one time per day  E. change Dexilant to Protonix 40 mg in a.m.   2. If needed:   A. can add OTC Benadryl  B. ProAir Respiclick 2 puffs every 4-6 hours  3. Check anti-pneumococcal antibody titers  4. Return to clinic in 12 weeks or earlier if problem  5. Obtain fall flu vaccine

## 2015-10-19 NOTE — Progress Notes (Addendum)
Follow-up Note  Referring Provider: [name], MD Primary Provider: Jerlyn Ly, MD Date of Office Visit: 10/18/2015  Subjective:   [name] (DOB: 1940-09-05) is a 75 y.o. female who returns to the [facility] [place] on 10/18/2015 in re-evaluation of the following:  HPI: [name] returns to this clinic in evaluation of her urticaria and respiratory tract symptoms. When I last saw her in this clinic on 09/20/2015 we made an attempt to address both these issues.  Concerning her urticaria, it has completely resolved. She still continues to use a combination of cetirizine and ranitidine and montelukast. It should be noted that she has become quite fatigued recently and feels very tired and can sleep many hours during the day.  Concerning her chest, she has resolved all her coughing and does not have any wheezing and has no sputum production and does not use a short acting bronchodilator. She no longer has any phlegm stuck in her throat.  Her reflux has doing quite well on her Dexilant but it is just a little bit too expensive to use. She continues on her ranitidine.  Concerning her history of developing respiratory tract infections in the setting of using rituximab for her rheumatoid arthritis, she did have a Pneumovax administered at her primary care doctor's office in investigation of relatively low levels of anti-pneumococcal antibodies directed against most serotypes approximately one month ago.  Concerning the fact that she is fatigued and tired she does have a history of untreated sleep apnea. She has been tried on a CPAP machine and a BiPAP machine and a nasal pillow and a full face mask on several occasions and has failed all this therapy. In addition, she has been given supplemental oxygen at nighttime but once again she cannot tolerate the use of the nasal cannula while she sleeps and she actually has more disturbed sleep all using these devices.    Medication List        Albuterol Sulfate 108 (90 Base) MCG/ACT Aepb Commonly known as:  PROAIR RESPICLICK Inhale 2 puffs into the lungs every 4 (four) hours as needed.   atorvastatin 40 MG tablet Commonly known as:  LIPITOR Take 40 mg by mouth daily.   B-12 2500 MCG Tabs Take 1 tablet by mouth daily.   cetirizine 10 MG tablet Commonly known as:  ZYRTEC TAKE ONE TABLET TWICE DAILY   cholecalciferol 1000 units tablet Commonly known as:  VITAMIN D Take 1,000 Units by mouth daily.   dexlansoprazole 60 MG capsule Commonly known as:  DEXILANT Take 1 capsule (60 mg total) by mouth daily.   DULoxetine 60 MG capsule Commonly known as:  CYMBALTA Take 60 mg by mouth at bedtime.   Fish Oil 1200 MG Caps Take 1 capsule by mouth daily.   fluticasone furoate-vilanterol 200-25 MCG/INH Aepb Commonly known as:  BREO ELLIPTA Inhale 1 puff into the lungs daily.   loperamide 2 MG capsule Commonly known as:  IMODIUM Take 4 mg by mouth as needed for diarrhea or loose stools.   metformin 1000 MG (OSM) 24 hr tablet Commonly known as:  FORTAMET Take 1,000 mg by mouth daily.   montelukast 10 MG tablet Commonly known as:  SINGULAIR TAKE ONE TABLET ONCE DAILY   morphine 30 MG tablet Commonly known as:  MSIR Take 30 mg by mouth 2 (two) times daily as needed for moderate pain or severe pain.   multivitamin with minerals Tabs tablet Take 1 tablet by mouth daily.  pantoprazole 40 MG tablet Commonly known as:  PROTONIX Take one tablet once every morning   predniSONE 5 MG tablet Commonly known as:  DELTASONE Take 5 mg by mouth daily.   ranitidine 150 MG tablet Commonly known as:  ZANTAC TAKE TWO TABLETS EVERY EVENING       Past Medical History:  Diagnosis Date  . Asthma   . Complication of anesthesia    Anesthesia told her years ago she got too cold during surgery  . Diabetes mellitus without complication ([place])   . Emphysema   . Fibromyalgia   . Osteoporosis   . Rheumatoid arthritis(714.0)    . Sleep apnea    no cpap    Past Surgical History:  Procedure Laterality Date  . ABDOMINAL HYSTERECTOMY    . ANKLE FRACTURE SURGERY  2007  . BREAST SURGERY     mammoplasty and then removed  . FOOT SURGERY     numerous  . I&D EXTREMITY Right 10/26/2014   Procedure: IRRIGATION AND DEBRIDEMENT RIGHT KNEE ;  Surgeon: Gaynelle Arabian, MD;  Location: [facility];  Service: Orthopedics;  Laterality: Right;  . KNEE BURSECTOMY Right 09/16/2014   Procedure: EXCISON OF PRE PATELLA BURSA RIGHT KNEE ;  Surgeon: Gaynelle Arabian, MD;  Location: [facility];  Service: Orthopedics;  Laterality: Right;  . PARTIAL HYSTERECTOMY  1970  . TOTAL KNEE ARTHROPLASTY  2005 / 2006   x2    Allergies  Allergen Reactions  . Penicillins Hives  . Sulfonamide Derivatives Nausea And Vomiting    REACTION: stomach problems    Review of systems negative except as noted in HPI / PMHx or noted below:  Review of Systems  Constitutional: Negative.   HENT: Negative.   Eyes: Negative.   Respiratory: Negative.   Cardiovascular: Negative.   Gastrointestinal: Negative.   Genitourinary: Negative.   Musculoskeletal: Negative.   Skin: Negative.   Neurological: Negative.   Endo/Heme/Allergies: Negative.   Psychiatric/Behavioral: Negative.      Objective:   Vitals:   10/18/15 1521  BP: 118/78  Pulse: 100  Resp: 18          Physical Exam  Constitutional: She is well-developed, well-nourished, and in no distress.  HENT:  Head: Normocephalic.  Right Ear: Tympanic membrane, external ear and ear canal normal.  Left Ear: Tympanic membrane, external ear and ear canal normal.  Nose: Nose normal. No mucosal edema or rhinorrhea.  Mouth/Throat: Uvula is midline, oropharynx is clear and moist and mucous membranes are normal. No oropharyngeal exudate.  Eyes: Conjunctivae are normal.  Neck: Trachea normal. No tracheal tenderness present. No tracheal deviation present. No thyromegaly present.  Cardiovascular: Normal rate, regular  rhythm, S1 normal, S2 normal and normal heart sounds.   No murmur heard. Pulmonary/Chest: Breath sounds normal. No stridor. No respiratory distress. She has no wheezes. She has no rales.  Musculoskeletal: She exhibits no edema.  Lymphadenopathy:       Head (right side): No tonsillar adenopathy present.       Head (left side): No tonsillar adenopathy present.    She has no cervical adenopathy.  Neurological: She is alert. Gait normal.  Skin: No rash noted. She is not diaphoretic. No erythema. Nails show no clubbing.  Psychiatric: Mood and affect normal.    Diagnostics:    Spirometry was performed and demonstrated an FEV1 of 2.27 at 105 % of predicted.  The patient had an Asthma Control Test with the following results: ACT Total Score: 15.    Assessment and  Plan:   1. Moderate persistent asthma, uncomplicated   2. Sleep apnea   3. Other fatigue   4. LPRD (laryngopharyngeal reflux disease)   5. Hypogammaglobulinemia ([place])   6. On rituximab therapy     1. Every day use the following medications:   A. change cetirizine to loratadine 10 mg twice a day  B. ranitidine 150 mg two tablets in PM  C. montelukast 10 mg daily  D. Breo 200 1 inhalation one time per day  E. change Dexilant to Protonix 40 mg in a.m.   2. If needed:   A. can add OTC Benadryl  B. ProAir Respiclick 2 puffs every 4-6 hours  3. Check anti-pneumococcal antibody titers  4. Return to clinic in 12 weeks or earlier if problem  5. Obtain fall flu vaccine  [place] is doing better regarding some of her issues especially her lower respiratory tract issue while consistently using a combination inhaler along with a leukotriene modifier and she appears to be doing okay with her reflux and her urticaria. I am going to continue to have her use a combination inhaler but I'm going to change her cetirizine to loratadine given the fact that she's developed rather significant fatigue and tiredness through the day. Hopefully  these symptoms are a reflection of using cetirizine at 20 mg daily and are not a reflection of her untreated sleep apnea with what sounds like a history of nocturnal deoxygenation. I am going to check her anti-pneumococcal antibody titers to see if she does have the ability to mount a antibody specific response with production of antigen specific antibodies directed against pneumococcus. If not, then we need to have a discussion with her rheumatologist about how we are going to approach her rituximab therapy. She has the option of discontinuing this form of therapy or continuing this form of therapy while receiving immune globulin infusions. I guess the question at hand is whether or not her rituximab therapy is helping her to any degree at this point time regarding control of her rheumatoid arthritis and this can certainly be addressed by her rheumatologist.  Allena Katz, MD [place]

## 2015-11-02 DIAGNOSIS — D849 Immunodeficiency, unspecified: Secondary | ICD-10-CM | POA: Diagnosis not present

## 2015-11-08 DIAGNOSIS — K449 Diaphragmatic hernia without obstruction or gangrene: Secondary | ICD-10-CM | POA: Diagnosis not present

## 2015-11-08 DIAGNOSIS — K219 Gastro-esophageal reflux disease without esophagitis: Secondary | ICD-10-CM | POA: Diagnosis not present

## 2015-11-08 DIAGNOSIS — R1013 Epigastric pain: Secondary | ICD-10-CM | POA: Diagnosis not present

## 2015-11-08 DIAGNOSIS — R0602 Shortness of breath: Secondary | ICD-10-CM | POA: Diagnosis not present

## 2015-11-08 DIAGNOSIS — K760 Fatty (change of) liver, not elsewhere classified: Secondary | ICD-10-CM | POA: Diagnosis not present

## 2015-11-08 DIAGNOSIS — N2889 Other specified disorders of kidney and ureter: Secondary | ICD-10-CM | POA: Diagnosis not present

## 2015-11-08 DIAGNOSIS — Z6831 Body mass index (BMI) 31.0-31.9, adult: Secondary | ICD-10-CM | POA: Diagnosis not present

## 2015-11-08 DIAGNOSIS — R1011 Right upper quadrant pain: Secondary | ICD-10-CM | POA: Diagnosis not present

## 2015-11-08 DIAGNOSIS — E119 Type 2 diabetes mellitus without complications: Secondary | ICD-10-CM | POA: Diagnosis not present

## 2015-11-09 LAB — PNEUMOCOCCAL IM (14 SEROTYPE)
PNEUMO AB TYPE 3: 0.3 ug/mL — AB (ref >1.3)
PNEUMO AB TYPE 56 (18C): 0.4 ug/mL — AB (ref >1.3)
PNEUMO AB TYPE 8: 0.4 ug/mL — AB (ref >1.3)
Pneumo Ab Type 1*: <0.3 ug/mL — ABNORMAL LOW (ref >1.3)
Pneumo Ab Type 14*: 4.6 ug/mL (ref >1.3)
Pneumo Ab Type 19 (19F)*: 0.4 ug/mL — ABNORMAL LOW (ref >1.3)
Pneumo Ab Type 23 (23F)*: 0.7 ug/mL — ABNORMAL LOW (ref >1.3)
Pneumo Ab Type 26 (6B)*: <0.3 ug/mL — ABNORMAL LOW (ref >1.3)
Pneumo Ab Type 51 (7F)*: 0.5 ug/mL — ABNORMAL LOW (ref >1.3)
Pneumo Ab Type 57 (19A)*: 3.2 ug/mL (ref >1.3)
Pneumo Ab Type 68 (9V)*: <0.3 ug/mL — ABNORMAL LOW (ref >1.3)
Pneumo Ab Type 9 (9N)*: 0.4 ug/mL — ABNORMAL LOW (ref >1.3)

## 2015-11-21 DIAGNOSIS — K760 Fatty (change of) liver, not elsewhere classified: Secondary | ICD-10-CM | POA: Diagnosis not present

## 2015-11-21 DIAGNOSIS — N281 Cyst of kidney, acquired: Secondary | ICD-10-CM | POA: Diagnosis not present

## 2015-11-21 DIAGNOSIS — K862 Cyst of pancreas: Secondary | ICD-10-CM | POA: Diagnosis not present

## 2015-12-04 ENCOUNTER — Ambulatory Visit: Payer: Medicare Other | Admitting: Podiatry

## 2015-12-11 ENCOUNTER — Encounter: Payer: Self-pay | Admitting: Podiatry

## 2015-12-11 ENCOUNTER — Ambulatory Visit (INDEPENDENT_AMBULATORY_CARE_PROVIDER_SITE_OTHER): Payer: Medicare Other | Admitting: Podiatry

## 2015-12-11 VITALS — BP 125/72 | HR 94 | Resp 14

## 2015-12-11 DIAGNOSIS — B351 Tinea unguium: Secondary | ICD-10-CM | POA: Diagnosis not present

## 2015-12-11 DIAGNOSIS — Q828 Other specified congenital malformations of skin: Secondary | ICD-10-CM

## 2015-12-11 DIAGNOSIS — M79676 Pain in unspecified toe(s): Secondary | ICD-10-CM

## 2015-12-11 DIAGNOSIS — M069 Rheumatoid arthritis, unspecified: Secondary | ICD-10-CM

## 2015-12-11 DIAGNOSIS — M158 Other polyosteoarthritis: Secondary | ICD-10-CM

## 2015-12-11 NOTE — Progress Notes (Signed)
Patient ID: SHRIA BOOZ, female   DOB: 05/24/1940, 75 y.o.   MRN: [id number] Complaint:  Visit Type: Patient returns to my office for continued preventative foot care services. Complaint: Patient states" my nails have grown long and thick and become painful to walk and wear shoes" Patient has been diagnosed with RA.. The patient presents for preventative foot care services. Patient says callus has become painful left foot.  Podiatric Exam: Vascular: dorsalis pedis and posterior tibial pulses are palpable bilateral. Capillary return is immediate. Temperature gradient is WNL. Skin turgor WNL  Sensorium: Normal Semmes Weinstein monofilament test. Normal tactile sensation bilaterally. Nail Exam: Pt has thick disfigured discolored nails with subungual debris noted bilateral entire nail hallux through fifth toenails Ulcer Exam: There is no evidence of ulcer or pre-ulcerative changes or infection. Orthopedic Exam: Muscle tone and strength are WNL. No limitations in general ROM. 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 No infection or ulcers.  Asymptomatic pinch callus right foot.  Callus medial midfoot left.  Callus second toe left foot.   Diagnosis:  Onychomycosis, , Pain in right toe, pain in left toes.  Porokeratosis left foot.  Treatment & Plan Procedures and Treatment: Consent by patient was obtained for treatment procedures. The patient understood the discussion of treatment and procedures well. All questions were answered thoroughly reviewed. Debridement of mycotic and hypertrophic toenails, 1 through 5 bilateral and clearing of subungual debris. No ulceration, no infection noted. Debride porokeratosis. Discused surgery for third toe left foot. Dispensed crest pad. Return Visit-Office Procedure: Patient instructed to return to the office for a follow up visit 10  weeks  for continued evaluation and treatment.   Gardiner Barefoot DPM

## 2015-12-21 DIAGNOSIS — M19012 Primary osteoarthritis, left shoulder: Secondary | ICD-10-CM | POA: Diagnosis not present

## 2015-12-21 DIAGNOSIS — M19011 Primary osteoarthritis, right shoulder: Secondary | ICD-10-CM | POA: Diagnosis not present

## 2016-01-03 DIAGNOSIS — Z23 Encounter for immunization: Secondary | ICD-10-CM | POA: Diagnosis not present

## 2016-01-10 ENCOUNTER — Ambulatory Visit: Payer: Medicare Other | Admitting: Allergy and Immunology

## 2016-01-15 DIAGNOSIS — E119 Type 2 diabetes mellitus without complications: Secondary | ICD-10-CM | POA: Diagnosis not present

## 2016-01-15 DIAGNOSIS — K862 Cyst of pancreas: Secondary | ICD-10-CM | POA: Diagnosis not present

## 2016-01-15 DIAGNOSIS — J449 Chronic obstructive pulmonary disease, unspecified: Secondary | ICD-10-CM | POA: Diagnosis not present

## 2016-01-15 DIAGNOSIS — M069 Rheumatoid arthritis, unspecified: Secondary | ICD-10-CM | POA: Diagnosis not present

## 2016-01-15 DIAGNOSIS — Z6830 Body mass index (BMI) 30.0-30.9, adult: Secondary | ICD-10-CM | POA: Diagnosis not present

## 2016-01-15 DIAGNOSIS — M810 Age-related osteoporosis without current pathological fracture: Secondary | ICD-10-CM | POA: Diagnosis not present

## 2016-01-18 ENCOUNTER — Ambulatory Visit (INDEPENDENT_AMBULATORY_CARE_PROVIDER_SITE_OTHER): Payer: Medicare Other | Admitting: Allergy and Immunology

## 2016-01-18 ENCOUNTER — Encounter: Payer: Self-pay | Admitting: Allergy and Immunology

## 2016-01-18 ENCOUNTER — Encounter: Payer: Self-pay | Admitting: *Deleted

## 2016-01-18 VITALS — BP 130/92 | HR 88 | Resp 16

## 2016-01-18 DIAGNOSIS — L509 Urticaria, unspecified: Secondary | ICD-10-CM

## 2016-01-18 DIAGNOSIS — J4541 Moderate persistent asthma with (acute) exacerbation: Secondary | ICD-10-CM

## 2016-01-18 DIAGNOSIS — D801 Nonfamilial hypogammaglobulinemia: Secondary | ICD-10-CM

## 2016-01-18 DIAGNOSIS — R5383 Other fatigue: Secondary | ICD-10-CM

## 2016-01-18 MED ORDER — BECLOMETHASONE DIPROPIONATE 80 MCG/ACT IN AERS: 2.0000 | INHALATION_SPRAY | Freq: Two times a day (BID) | RESPIRATORY_TRACT | 5 refills | Status: DC

## 2016-01-18 NOTE — Patient Instructions (Addendum)
  1. Every day use the following medications:   A. loratadine 10 mg twice a day  B. ranitidine 150 mg two tablets in PM  C. montelukast 10 mg daily  D. Breo 200 1 inhalation one time per day  E. Protonix 40 mg tablet in a.m.  2. If needed:   A. can add OTC Benadryl  B. ProAir Respiclick 2 puffs every 4-6 hours  3. Check CBC w/diff, IgA/G/M and anti-pneumococcal antibody titers  4. For this most recent flareup utilize the following:   A. OTC Mucinex DM 2 tablets twice a day  B. add Qvar 80 2 inhalations twice a day to Breo  C. use pro-air Respiclick if needed  5. Consolidate caffeine and chocolate consumption  6. Immunoglobulin infusions for recurrent respiratory tract infections?  7. Return to clinic in 12 weeks or earlier if problem

## 2016-01-18 NOTE — Progress Notes (Signed)
Follow-up Note  Referring Provider: [name], MD Primary Provider: Jerlyn Ly, MD Date of Office Visit: 01/18/2016  Subjective:   [name] (DOB: 05/11/1940) is a 75 y.o. 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 I last saw her in his clinic in August 2017.  Recently [name] developed a issue with cough and congestion in her chest for which she saw her primary care doctor who gave her prednisone and Z-Pak which she just finished yesterday. This has developed while consistently using her Breo. There is no obvious provoking factor giving rise to this episode. She's not had any fever and she's not had any chest pain and she's not had any hemoptysis and there has been no significant upper airway symptoms.  Her reflux is still intermittently active even on Protonix and ranitidine. It should be noted that she drinks a half a liter of Pepsi per day and has chocolate daily.  She has not been having any urticaria. It does appear as though Zyrtec was contributing to her fatigue for when she discontinued this medication and replaced it with loratadine her fatigue resolved.    Medication List      Albuterol Sulfate 108 (90 Base) MCG/ACT Aepb Commonly known as:  PROAIR RESPICLICK Inhale 2 puffs into the lungs every 4 (four) hours as needed.   atorvastatin 40 MG tablet Commonly known as:  LIPITOR Take 40 mg by mouth daily.   B-12 2500 MCG Tabs Take 1 tablet by mouth daily.   beclomethasone 80 MCG/ACT inhaler Commonly known as:  QVAR Inhale 2 puffs into the lungs 2 (two) times daily.   cholecalciferol 1000 units tablet Commonly known as:  VITAMIN D Take 1,000 Units by mouth daily.   DULoxetine 60 MG capsule Commonly known as:  CYMBALTA Take 60 mg by mouth at bedtime.   Fish Oil 1200 MG Caps Take 1  capsule by mouth daily.   fluticasone furoate-vilanterol 200-25 MCG/INH Aepb Commonly known as:  BREO ELLIPTA Inhale 1 puff into the lungs daily.   loperamide 2 MG capsule Commonly known as:  IMODIUM Take 4 mg by mouth as needed for diarrhea or loose stools.   metFORMIN 500 MG tablet Commonly known as:  GLUCOPHAGE   montelukast 10 MG tablet Commonly known as:  SINGULAIR TAKE ONE TABLET ONCE DAILY   morphine 30 MG tablet Commonly known as:  MSIR Take 30 mg by mouth 2 (two) times daily as needed for moderate pain or severe pain.   multivitamin with minerals Tabs tablet Take 1 tablet by mouth daily.   pantoprazole 40 MG tablet Commonly known as:  PROTONIX Take one tablet once every morning   predniSONE 5 MG tablet Commonly known as:  DELTASONE Take 5 mg by mouth daily.   ranitidine 150 MG tablet Commonly known as:  ZANTAC TAKE TWO TABLETS EVERY EVENING       Past Medical History:  Diagnosis Date  . Asthma   . Complication of anesthesia    Anesthesia told her years ago she got too cold during surgery  . Diabetes mellitus without complication ([place])   . Emphysema   . Fibromyalgia   . Osteoporosis   . Rheumatoid arthritis(714.0)   . Sleep apnea    no cpap    Past Surgical History:  Procedure Laterality Date  . ABDOMINAL HYSTERECTOMY    .  ANKLE FRACTURE SURGERY  2007  . BREAST SURGERY     mammoplasty and then removed  . FOOT SURGERY     numerous  . I&D EXTREMITY Right 10/26/2014   Procedure: IRRIGATION AND DEBRIDEMENT RIGHT KNEE ;  Surgeon: Gaynelle Arabian, MD;  Location: [facility];  Service: Orthopedics;  Laterality: Right;  . KNEE BURSECTOMY Right 09/16/2014   Procedure: EXCISON OF PRE PATELLA BURSA RIGHT KNEE ;  Surgeon: Gaynelle Arabian, MD;  Location: [facility];  Service: Orthopedics;  Laterality: Right;  . PARTIAL HYSTERECTOMY  1970  . TOTAL KNEE ARTHROPLASTY  2005 / 2006   x2    Allergies  Allergen Reactions  . Penicillins Hives  . Sulfonamide Derivatives  Nausea And Vomiting    REACTION: stomach problems    Review of systems negative except as noted in HPI / PMHx or noted below:  Review of Systems  Constitutional: Negative.   HENT: Negative.   Eyes: Negative.   Respiratory: Negative.   Cardiovascular: Negative.   Gastrointestinal: Negative.   Genitourinary: Negative.   Musculoskeletal: Negative.   Skin: Negative.   Neurological: Negative.   Endo/Heme/Allergies: Negative.   Psychiatric/Behavioral: Negative.      Objective:   Vitals:   01/18/16 1535  BP: (!) 130/92  Pulse: 88  Resp: 16          Physical Exam  Constitutional: She is well-developed, well-nourished, and in no distress.  Cough  HENT:  Head: Normocephalic.  Right Ear: Tympanic membrane, external ear and ear canal normal.  Left Ear: Tympanic membrane, external ear and ear canal normal. Left ear middle ear effusion: dull light reflex.  Nose: Nose normal. No mucosal edema or rhinorrhea.  Mouth/Throat: Uvula is midline, oropharynx is clear and moist and mucous membranes are normal. No oropharyngeal exudate.  Eyes: Conjunctivae are normal.  Neck: Trachea normal. No tracheal tenderness present. No tracheal deviation present. No thyromegaly present.  Cardiovascular: Normal rate, regular rhythm, S1 normal, S2 normal and normal heart sounds.   No murmur heard. Pulmonary/Chest: Breath sounds normal. No stridor. No respiratory distress. She has no wheezes. She has no rales.  Musculoskeletal: She exhibits no edema.  Lymphadenopathy:       Head (right side): No tonsillar adenopathy present.       Head (left side): No tonsillar adenopathy present.    She has no cervical adenopathy.  Neurological: She is alert. Gait normal.  Skin: No rash noted. She is not diaphoretic. No erythema. Nails show no clubbing.  Psychiatric: Mood and affect normal.    Diagnostics: Results of blood tests obtained on 11/02/2015 identified very low levels antibodies directed against various  serotypes of pneumococcus. Basically it was no improvement from her blood tests obtained 2016 2017. Thus, she did not mount a antigen specific antibody response against her Pneumovax.   Spirometry was performed and demonstrated an FEV1 of 2.06 at 95 % of predicted.  The patient had an Asthma Control Test with the following results: ACT Total Score: 6.    Assessment and Plan:   1. Hypogammaglobulinemia ([name])   2. Asthma, not well controlled, moderate persistent, with acute exacerbation   3. Urticaria   4. Other fatigue     1. Every day use the following medications:   A. loratadine 10 mg twice a day  B. ranitidine 150 mg two tablets in PM  C. montelukast 10 mg daily  D. Breo 200 1 inhalation one time per day  E. Protonix 40 mg tablet in a.m.  2.  If needed:   A. can add OTC Benadryl  B. ProAir Respiclick 2 puffs every 4-6 hours  3. Check CBC w/diff, IgA/G/M and anti-pneumococcal antibody titers  4. For this most recent flareup utilize the following:   A. OTC Mucinex DM 2 tablets twice a day  B. add Qvar 80 2 inhalations twice a day to Breo  C. use pro-air Respiclick if needed  5. Consolidate caffeine and chocolate consumption  6. Immunoglobulin infusions for recurrent respiratory tract infections?  7. Return to clinic in 12 weeks or earlier if problem  I will have [name] utilize the action plan mentioned above which includes adding in Qvar 80 to her Memory Dance whenever she does develop a respiratory tract flare and hopefully with this plan plus her recent administration of azithromycin and prednisone from her primary care physician she'll get over this most recent flareup. I think what is of most concern is the fact that it does not appear her immune system is able to mount a antigen specific antibody response based upon the blood tests that we have obtained recently. This is probably a reflection of her rituximab use and even though her last dose was in March 2017 it is possible that  the rituximab may still be producing an effect or it is possible that the rituximab has produced a permanent and change in her immune system. The only way that we will be able to make a decision about whether or not she will require immunoglobulin infusion is to follow her along from this point on forward to see if she does have recurrent respiratory tract infections and to follow her blood serially to see if she end up mounting an antigen specific antibody response. I will contact her with the results of her blood tests once they are available for review and certainly she will contact me should she not resolved her most recent respiratory tract flare. Fortunately, her fatigue is much better since she stopped her Zyrtec and I think that her reflux is going to be much better if she can taper down or taper off her caffeine and chocolate consumption.  Allena Katz, MD [place]

## 2016-01-23 ENCOUNTER — Other Ambulatory Visit: Payer: Self-pay | Admitting: *Deleted

## 2016-01-23 MED ORDER — RANITIDINE HCL 150 MG PO TABS: ORAL_TABLET | ORAL | 1 refills | Status: DC

## 2016-01-29 ENCOUNTER — Other Ambulatory Visit: Payer: Self-pay | Admitting: *Deleted

## 2016-01-29 DIAGNOSIS — M0579 Rheumatoid arthritis with rheumatoid factor of multiple sites without organ or systems involvement: Secondary | ICD-10-CM | POA: Diagnosis not present

## 2016-01-29 DIAGNOSIS — S61011A Laceration without foreign body of right thumb without damage to nail, initial encounter: Secondary | ICD-10-CM | POA: Diagnosis not present

## 2016-01-29 DIAGNOSIS — M06072 Rheumatoid arthritis without rheumatoid factor, left ankle and foot: Secondary | ICD-10-CM | POA: Diagnosis not present

## 2016-01-29 DIAGNOSIS — M205X2 Other deformities of toe(s) (acquired), left foot: Secondary | ICD-10-CM | POA: Insufficient documentation

## 2016-01-29 DIAGNOSIS — M06071 Rheumatoid arthritis without rheumatoid factor, right ankle and foot: Secondary | ICD-10-CM | POA: Diagnosis not present

## 2016-01-29 DIAGNOSIS — Z87891 Personal history of nicotine dependence: Secondary | ICD-10-CM | POA: Diagnosis not present

## 2016-01-29 DIAGNOSIS — D801 Nonfamilial hypogammaglobulinemia: Secondary | ICD-10-CM | POA: Diagnosis not present

## 2016-01-29 DIAGNOSIS — Z79899 Other long term (current) drug therapy: Secondary | ICD-10-CM | POA: Diagnosis not present

## 2016-01-29 MED ORDER — MONTELUKAST SODIUM 10 MG PO TABS: ORAL_TABLET | ORAL | 1 refills | Status: DC

## 2016-02-19 ENCOUNTER — Ambulatory Visit: Payer: Medicare Other | Admitting: Podiatry

## 2016-02-21 ENCOUNTER — Telehealth: Payer: Self-pay | Admitting: *Deleted

## 2016-02-21 NOTE — Telephone Encounter (Signed)
Patient advised of results and Dr Lyndon Code comments

## 2016-02-21 NOTE — Telephone Encounter (Signed)
-----   Message from Jiles Prows, MD sent at 02/21/2016  2:06 PM EST ----- Please inform patient that I did review the lab tests regarding pneumococcal antibodies and there does appear to be some improvement regarding antibody levels against several of the pneumococcus tested. There is not a very robust increase but there is some increase which I think overall is a good sign and may suggest that she is moving forward in the right direction regarding return of her immune system following discontinuation of rituximab.

## 2016-03-18 ENCOUNTER — Ambulatory Visit (INDEPENDENT_AMBULATORY_CARE_PROVIDER_SITE_OTHER): Payer: Medicare Other | Admitting: Podiatry

## 2016-03-18 ENCOUNTER — Other Ambulatory Visit: Payer: Self-pay | Admitting: Allergy and Immunology

## 2016-03-18 ENCOUNTER — Encounter: Payer: Self-pay | Admitting: Podiatry

## 2016-03-18 VITALS — Ht 66.0 in | Wt 197.0 lb

## 2016-03-18 DIAGNOSIS — M79676 Pain in unspecified toe(s): Secondary | ICD-10-CM

## 2016-03-18 DIAGNOSIS — B351 Tinea unguium: Secondary | ICD-10-CM | POA: Diagnosis not present

## 2016-03-18 DIAGNOSIS — M2141 Flat foot [pes planus] (acquired), right foot: Secondary | ICD-10-CM

## 2016-03-18 DIAGNOSIS — Q828 Other specified congenital malformations of skin: Secondary | ICD-10-CM

## 2016-03-18 DIAGNOSIS — M2142 Flat foot [pes planus] (acquired), left foot: Secondary | ICD-10-CM

## 2016-03-18 DIAGNOSIS — M21619 Bunion of unspecified foot: Secondary | ICD-10-CM

## 2016-03-18 NOTE — Progress Notes (Signed)
Patient ID: [name], female   DOB: 1940-05-02, 75 y.o.   MRN: [id number] Complaint:  Visit Type: Patient returns to my office for continued preventative foot care services. Complaint: Patient states" my nails have grown long and thick and become painful to walk and wear shoes" Patient has been diagnosed with RA.. The patient presents for preventative foot care services. Patient says callus has become painful left foot.  Podiatric Exam: Vascular: dorsalis pedis and posterior tibial pulses are palpable bilateral. Capillary return is immediate. Temperature gradient is WNL. Skin turgor WNL  Sensorium: Normal Semmes Weinstein monofilament test. Normal tactile sensation bilaterally. Nail Exam: Pt has thick disfigured discolored nails with subungual debris noted bilateral entire nail hallux through fifth toenails Ulcer Exam: There is no evidence of ulcer or pre-ulcerative changes or infection. Orthopedic Exam: Muscle tone and strength are WNL. No limitations in general ROM. 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 No infection or ulcers.  Asymptomatic pinch callus right foot.  Callus medial midfoot left.  Callus second toe left foot.   Diagnosis:  Onychomycosis, , Pain in right toe, pain in left toes.  Porokeratosis left foot.  Treatment & Plan Procedures and Treatment: Consent by patient was obtained for treatment procedures. The patient understood the discussion of treatment and procedures well. All questions were answered thoroughly reviewed. Debridement of mycotic and hypertrophic toenails, 1 through 5 bilateral and clearing of subungual debris. No ulceration, no infection noted. Debride porokeratosis. Discused surgery for third toe left foot. Dispensed crest pad. Return Visit-Office Procedure: Patient instructed to return to the office for a follow up visit 10  weeks  for continued evaluation and treatment.   Gardiner Barefoot DPM

## 2016-04-11 ENCOUNTER — Ambulatory Visit: Payer: PRIVATE HEALTH INSURANCE | Admitting: Allergy and Immunology

## 2016-04-15 ENCOUNTER — Telehealth: Payer: Self-pay | Admitting: Allergy and Immunology

## 2016-04-15 ENCOUNTER — Other Ambulatory Visit: Payer: Self-pay

## 2016-04-15 MED ORDER — ALBUTEROL SULFATE 108 (90 BASE) MCG/ACT IN AEPB: 2.0000 | INHALATION_SPRAY | RESPIRATORY_TRACT | 1 refills | Status: DC | PRN

## 2016-04-15 NOTE — Telephone Encounter (Signed)
[name] said she called [facility] to get a refill on her PROAIR respiclick and they said they don't have a RX on file.  Please send in RX.

## 2016-04-15 NOTE — Telephone Encounter (Signed)
Rx sent 

## 2016-05-08 DIAGNOSIS — E119 Type 2 diabetes mellitus without complications: Secondary | ICD-10-CM | POA: Diagnosis not present

## 2016-05-13 ENCOUNTER — Ambulatory Visit (INDEPENDENT_AMBULATORY_CARE_PROVIDER_SITE_OTHER): Payer: Medicare Other | Admitting: Allergy and Immunology

## 2016-05-13 ENCOUNTER — Encounter: Payer: Self-pay | Admitting: Allergy and Immunology

## 2016-05-13 VITALS — BP 106/76 | HR 110 | Resp 24

## 2016-05-13 DIAGNOSIS — L509 Urticaria, unspecified: Secondary | ICD-10-CM

## 2016-05-13 DIAGNOSIS — J4541 Moderate persistent asthma with (acute) exacerbation: Secondary | ICD-10-CM | POA: Diagnosis not present

## 2016-05-13 DIAGNOSIS — J3089 Other allergic rhinitis: Secondary | ICD-10-CM | POA: Diagnosis not present

## 2016-05-13 DIAGNOSIS — D801 Nonfamilial hypogammaglobulinemia: Secondary | ICD-10-CM | POA: Diagnosis not present

## 2016-05-13 MED ORDER — ALBUTEROL SULFATE 108 (90 BASE) MCG/ACT IN AEPB: 2.0000 | INHALATION_SPRAY | RESPIRATORY_TRACT | 1 refills | Status: DC | PRN

## 2016-05-13 NOTE — Progress Notes (Signed)
Follow-up Note  Referring Provider: [name], MD Primary Provider: Jerlyn Ly, MD Date of Office Visit: 05/13/2016  Subjective:   [name] (DOB: 1940-10-26) is a 76 y.o. female who returns to the [facility] on 05/13/2016 in re-evaluation of the following:  HPI: Smt. 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 I last saw her in his clinic in November 2017 at which point in time she was doing relatively well.  Over the course of the past 24 hours she has developed wheezing and coughing and she had used her bronchodilator this morning. She's actually a little bit better as the morning has gone on. There is no obvious provoking factor giving rise to this issue. She has no associated systemic or constitutional symptoms. Specifically, she has no fever or chills or sputum production or chest pain and she has no issues with her upper airway and no issues with reflux.  She's had very good control of her asthma up until this point in time. She did not require an increased dose of her chronic systemic steroid utilized for rheumatoid arthritis at 5 mg daily. She rarely uses a short-acting bronchodilator while continuing to use her controller agent.  She's had very little problems with her upper airways. She has not required an antibiotic to treat an episode of sinusitis.  Her reflux has been under very good control and she's been having no problems with her throat while utilizing treatment for reflux.  She has not been having any recurrence of urticaria.  She is now using prednisone 5 mg daily as her only treatment for rheumatoid arthritis. She made a decision not to pursue further treatment with rituximab given the fact that she did develop hypogammaglobulinemia with this therapy and did have a history of having recurrent bacterial respiratory tract infections in conjunction with  her hypogammaglobulinemia.  Allergies as of 05/13/2016      Reactions   Penicillins Hives   Sulfonamide Derivatives Nausea And Vomiting   REACTION: stomach problems      Medication List      Albuterol Sulfate 108 (90 Base) MCG/ACT Aepb Commonly known as:  PROAIR RESPICLICK Inhale 2 puffs into the lungs as needed (every four to six hours for cough or wheeze).   atorvastatin 40 MG tablet Commonly known as:  LIPITOR Take 40 mg by mouth daily.   B-12 2500 MCG Tabs Take 1 tablet by mouth daily.   beclomethasone 80 MCG/ACT inhaler Commonly known as:  QVAR Inhale 2 puffs into the lungs 2 (two) times daily.   cholecalciferol 1000 units tablet Commonly known as:  VITAMIN D Take 1,000 Units by mouth daily.   DULoxetine 60 MG capsule Commonly known as:  CYMBALTA Take 60 mg by mouth at bedtime.   Fish Oil 1200 MG Caps Take 1 capsule by mouth daily.   fluticasone furoate-vilanterol 200-25 MCG/INH Aepb Commonly known as:  BREO ELLIPTA Inhale 1 puff into the lungs daily.   loperamide 2 MG capsule Commonly known as:  IMODIUM Take 4 mg by mouth as needed for diarrhea or loose stools.   metFORMIN 500 MG tablet Commonly known as:  GLUCOPHAGE   montelukast 10 MG tablet Commonly known as:  SINGULAIR TAKE ONE TABLET ONCE DAILY   montelukast 10 MG tablet Commonly known as:  SINGULAIR TAKE 1 TABLET BY MOUTH ONCE DAILY   morphine 30 MG tablet Commonly known as:  MSIR Take 30  mg by mouth 2 (two) times daily as needed for moderate pain or severe pain.   multivitamin with minerals Tabs tablet Take 1 tablet by mouth daily.   pantoprazole 40 MG tablet Commonly known as:  PROTONIX Take one tablet once every morning   predniSONE 5 MG tablet Commonly known as:  DELTASONE Take 5 mg by mouth daily.   ranitidine 150 MG tablet Commonly known as:  ZANTAC TAKE TWO TABLETS EVERY EVENING       Past Medical History:  Diagnosis Date  . Asthma   . Complication of anesthesia     Anesthesia told her years ago she got too cold during surgery  . Diabetes mellitus without complication ([place])   . Emphysema   . Fibromyalgia   . Osteoporosis   . Rheumatoid arthritis(714.0)   . Sleep apnea    no cpap    Past Surgical History:  Procedure Laterality Date  . ABDOMINAL HYSTERECTOMY    . ANKLE FRACTURE SURGERY  2007  . BREAST SURGERY     mammoplasty and then removed  . FOOT SURGERY     numerous  . I&D EXTREMITY Right 10/26/2014   Procedure: IRRIGATION AND DEBRIDEMENT RIGHT KNEE ;  Surgeon: Gaynelle Arabian, MD;  Location: [facility];  Service: Orthopedics;  Laterality: Right;  . KNEE BURSECTOMY Right 09/16/2014   Procedure: EXCISON OF PRE PATELLA BURSA RIGHT KNEE ;  Surgeon: Gaynelle Arabian, MD;  Location: [facility];  Service: Orthopedics;  Laterality: Right;  . PARTIAL HYSTERECTOMY  1970  . TOTAL KNEE ARTHROPLASTY  2005 / 2006   x2    Review of systems negative except as noted in HPI / PMHx or noted below:  Review of Systems  Constitutional: Negative.   HENT: Negative.   Eyes: Negative.   Respiratory: Negative.   Cardiovascular: Negative.   Gastrointestinal: Negative.   Genitourinary: Negative.   Musculoskeletal: Negative.   Skin: Negative.   Neurological: Negative.   Endo/Heme/Allergies: Negative.   Psychiatric/Behavioral: Negative.      Objective:   Vitals:   05/13/16 1147  BP: 106/76  Pulse: (!) 110  Resp: (!) 24          Physical Exam  Constitutional: She is well-developed, well-nourished, and in no distress.  HENT:  Head: Normocephalic.  Right Ear: Tympanic membrane, external ear and ear canal normal.  Left Ear: Tympanic membrane, external ear and ear canal normal.  Nose: Nose normal. No mucosal edema or rhinorrhea.  Mouth/Throat: Uvula is midline, oropharynx is clear and moist and mucous membranes are normal. No oropharyngeal exudate.  Eyes: Conjunctivae are normal.  Neck: Trachea normal. No tracheal tenderness present. No tracheal deviation  present. No thyromegaly present.  Cardiovascular: Normal rate, regular rhythm, S1 normal, S2 normal and normal heart sounds.   No murmur heard. Pulmonary/Chest: No stridor. No respiratory distress. She has wheezes (bilateral expiratory wheezes posterior lung fields). She has no rales.  Lymphadenopathy:       Head (right side): No tonsillar adenopathy present.       Head (left side): No tonsillar adenopathy present.    She has no cervical adenopathy.  Neurological: She is alert.  Skin: No rash noted. She is not diaphoretic. No erythema. Nails show no clubbing.  Psychiatric: Mood and affect normal.    Diagnostics: Results of blood tests obtained 01/29/2016 identified an IgG of 549, IgM 25, IgA 184 MG/DL.  Results of a lymphocyte enumeration study obtained 01/29/2016 identified the following:  Normal distribution of T cells and NK  cells. B cells are absent. The proportion of memory and naive T cells is appropriate for age.    Spirometry was performed and demonstrated an FEV1 of 1.97 at 84 % of predicted.  The patient had an Asthma Control Test with the following results: ACT Total Score: 13.    Assessment and Plan:   1. Asthma, not well controlled, moderate persistent, with acute exacerbation   2. Other allergic rhinitis   3. Urticaria   4. Hypogammaglobulinemia ([place])     1. Every day use the following medications:   A. loratadine 10 mg twice a day  B. ranitidine 150 mg two tablets in PM  C. montelukast 10 mg daily  D. Breo 200 1 inhalation one time per day  E. Protonix 40 mg tablet in a.m.  2. If needed:   A. can add OTC Benadryl  B. ProAir Respiclick 2 puffs every 4-6 hours  3. For this most recent flareup utilize the following:   A. OTC Mucinex DM 2 tablets twice a day  B. add Qvar 80 2 inhalations twice a day to Breo  C. use pro-air Respiclick if needed  D. Increase prednisone to 20mg  daily for three days only  4. Return to clinic in Summer 2018 or earlier if  problem  Oneita appears to be doing relatively well until the past 24 hours. I will assume she will respond to the acute therapy noted above and we'll continue to do well while consistently using anti-inflammatory medications for her respiratory tract and therapy for reflux as noted above. She will contact me should she develop significant problems as she moves forward. It is quite encouraging that she has not had any significant infections requiring antibiotics over the course of the past several months suggesting that her immune system may be returning to a better functioning state the further away she gets from rituximab administration.  Allena Katz, MD Allergy / Immunology [place]

## 2016-05-13 NOTE — Patient Instructions (Addendum)
  1. Every day use the following medications:   A. loratadine 10 mg twice a day  B. ranitidine 150 mg two tablets in PM  C. montelukast 10 mg daily  D. Breo 200 1 inhalation one time per day  E. Protonix 40 mg tablet in a.m.  2. If needed:   A. can add OTC Benadryl  B. ProAir Respiclick 2 puffs every 4-6 hours  3. For this most recent flareup utilize the following:   A. OTC Mucinex DM 2 tablets twice a day  B. add Qvar 80 2 inhalations twice a day to Breo  C. use pro-air Respiclick if needed  D. Increase prednisone to 20mg  daily for three days only  4. Return to clinic in Summer 2018 or earlier if problem

## 2016-05-20 ENCOUNTER — Ambulatory Visit: Payer: Medicare Other | Admitting: Podiatry

## 2016-05-24 ENCOUNTER — Ambulatory Visit: Payer: Medicare Other | Admitting: Sports Medicine

## 2016-05-28 DIAGNOSIS — J209 Acute bronchitis, unspecified: Secondary | ICD-10-CM | POA: Diagnosis not present

## 2016-06-04 DIAGNOSIS — R079 Chest pain, unspecified: Secondary | ICD-10-CM | POA: Diagnosis not present

## 2016-06-04 DIAGNOSIS — Z79899 Other long term (current) drug therapy: Secondary | ICD-10-CM | POA: Insufficient documentation

## 2016-06-04 DIAGNOSIS — M06072 Rheumatoid arthritis without rheumatoid factor, left ankle and foot: Secondary | ICD-10-CM | POA: Diagnosis not present

## 2016-06-04 DIAGNOSIS — Z7982 Long term (current) use of aspirin: Secondary | ICD-10-CM | POA: Diagnosis not present

## 2016-06-04 DIAGNOSIS — M797 Fibromyalgia: Secondary | ICD-10-CM | POA: Insufficient documentation

## 2016-06-04 DIAGNOSIS — R441 Visual hallucinations: Secondary | ICD-10-CM | POA: Diagnosis not present

## 2016-06-04 DIAGNOSIS — M06071 Rheumatoid arthritis without rheumatoid factor, right ankle and foot: Secondary | ICD-10-CM | POA: Diagnosis not present

## 2016-06-04 DIAGNOSIS — D801 Nonfamilial hypogammaglobulinemia: Secondary | ICD-10-CM | POA: Insufficient documentation

## 2016-06-04 DIAGNOSIS — Z87891 Personal history of nicotine dependence: Secondary | ICD-10-CM | POA: Diagnosis not present

## 2016-06-06 ENCOUNTER — Ambulatory Visit (INDEPENDENT_AMBULATORY_CARE_PROVIDER_SITE_OTHER): Payer: Medicare Other | Admitting: Sports Medicine

## 2016-06-06 DIAGNOSIS — M79676 Pain in unspecified toe(s): Secondary | ICD-10-CM | POA: Diagnosis not present

## 2016-06-06 DIAGNOSIS — M2141 Flat foot [pes planus] (acquired), right foot: Secondary | ICD-10-CM

## 2016-06-06 DIAGNOSIS — Q828 Other specified congenital malformations of skin: Secondary | ICD-10-CM | POA: Diagnosis not present

## 2016-06-06 DIAGNOSIS — B351 Tinea unguium: Secondary | ICD-10-CM

## 2016-06-06 DIAGNOSIS — M21619 Bunion of unspecified foot: Secondary | ICD-10-CM

## 2016-06-06 DIAGNOSIS — M2142 Flat foot [pes planus] (acquired), left foot: Secondary | ICD-10-CM

## 2016-06-06 DIAGNOSIS — M069 Rheumatoid arthritis, unspecified: Secondary | ICD-10-CM

## 2016-06-06 DIAGNOSIS — E1142 Type 2 diabetes mellitus with diabetic polyneuropathy: Secondary | ICD-10-CM

## 2016-06-06 NOTE — Progress Notes (Signed)
Subjective: [name] is a 76 y.o. female patient with history of diabetes who presents to office today complaining of callus and long, painful nails  while ambulating in shoes; unable to trim. Patient states that the glucose reading this morning was not recorded does not know her A1c. Patient denies any new changes in medication or new problems. Patient denies any new cramping, numbness, burning or tingling in the legs.  Patient Active Problem List   Diagnosis Date Noted  . Septic prepatellar bursitis of right knee 10/26/2014  . Prepatellar bursitis of right knee 08/28/2014  . DYSPHAGIA ORAL PHASE 06/18/2007  . ASTHMA 05/14/2007  . SLEEP APNEA 05/14/2007  . GERD 05/13/2007  . Rheumatoid arthritis ([place]) 05/13/2007  . OSTEOPOROSIS 05/13/2007  . DIARRHEA, CHRONIC 05/13/2007   Current Outpatient Prescriptions on File Prior to Visit  Medication Sig Dispense Refill  . Albuterol Sulfate (PROAIR RESPICLICK) 630 (90 Base) MCG/ACT AEPB Inhale 2 puffs into the lungs as needed (every four to six hours for cough or wheeze). 1 each 1  . atorvastatin (LIPITOR) 40 MG tablet Take 40 mg by mouth daily.     . beclomethasone (QVAR) 80 MCG/ACT inhaler Inhale 2 puffs into the lungs 2 (two) times daily. 1 Inhaler 5  . cholecalciferol (VITAMIN D) 1000 UNITS tablet Take 1,000 Units by mouth daily.    . Cyanocobalamin (B-12) 2500 MCG TABS Take 1 tablet by mouth daily.    . DULoxetine (CYMBALTA) 60 MG capsule Take 60 mg by mouth at bedtime.     . fluticasone furoate-vilanterol (BREO ELLIPTA) 200-25 MCG/INH AEPB Inhale 1 puff into the lungs daily. 60 each 5  . loperamide (IMODIUM) 2 MG capsule Take 4 mg by mouth as needed for diarrhea or loose stools.    . metFORMIN (GLUCOPHAGE) 500 MG tablet     . montelukast (SINGULAIR) 10 MG tablet TAKE ONE TABLET ONCE DAILY 90 tablet 1  . montelukast (SINGULAIR) 10 MG tablet TAKE 1 TABLET BY MOUTH ONCE DAILY 30 tablet 1  . morphine (MSIR) 30 MG tablet Take 30 mg by mouth 2  (two) times daily as needed for moderate pain or severe pain.     . Multiple Vitamin (MULTIVITAMIN WITH MINERALS) TABS tablet Take 1 tablet by mouth daily.    . Omega-3 Fatty Acids (FISH OIL) 1200 MG CAPS Take 1 capsule by mouth daily.    . pantoprazole (PROTONIX) 40 MG tablet Take one tablet once every morning 30 tablet 5  . predniSONE (DELTASONE) 5 MG tablet Take 5 mg by mouth daily.     . ranitidine (ZANTAC) 150 MG tablet TAKE TWO TABLETS EVERY EVENING 180 tablet 1   No current facility-administered medications on file prior to visit.    Allergies  Allergen Reactions  . Penicillins Hives  . Sulfonamide Derivatives Nausea And Vomiting    REACTION: stomach problems    No results found for this or any previous visit (from the past 2160 hour(s)).  Objective: General: Patient is awake, alert, and oriented x 3 and in no acute distress.  Integument: Skin is warm, dry and supple bilateral. Nails are tender, long, thickened and  dystrophic with subungual debris, consistent with onychomycosis, 1-5 bilateral. No signs of infection. Callus plantar medial left foot and arch and right fourth toe distal tuft. Remaining integument unremarkable.  Vasculature:  Dorsalis Pedis pulse 1/4 bilateral. Posterior Tibial pulse  1/4 bilateral.  Capillary fill time <3 sec 1-5 bilateral. No hair growth to the level of the digits. Temperature  gradient within normal limits. + varicosities present bilateral. Trace edema present bilateral.   Neurology: The patient has diminished sensation measured with a 5.07/10g Semmes Weinstein Monofilament at all pedal sites bilateral . Vibratory sensation diminished bilateral with tuning fork. No Babinski sign present bilateral.   Musculoskeletal: Severely dislocated lesser metatarsophalangeal joints and digital deformities secondary to rheumatoid arthritis, there is significant pes planus deformity with plantar bony prominences specifically at the navicular of the left foot.  Muscular strength 4/5 in all lower extremity muscular groups bilateral without pain on range of motion . No tenderness with calf compression bilateral.  Assessment and Plan: Problem List Items Addressed This Visit      Musculoskeletal and Integument   Rheumatoid arthritis ([place])    Other Visit Diagnoses    Dermatophytosis of nail    -  Primary   Pain of toe, unspecified laterality       Porokeratosis       Pes planus of both feet       Bunion       Diabetic polyneuropathy associated with type 2 diabetes mellitus ([place])          -Examined patient. -Discussed and educated patient on diabetic foot care, especially with  regards to the vascular, neurological and musculoskeletal systems.  -Stressed the importance of good glycemic control and the detriment of not  controlling glucose levels in relation to the foot. -Mechanically Callus 2 using sterile chisel blade and debrided all nails 1-5 bilateral using sterile nail nipper and filed with dremel without incident  -Recommend good supportive shoes daily for foot type -Answered all patient questions -Patient to return  in 62 days for at risk foot care -Patient advised to call the office if any problems or questions arise in the meantime.  Landis Martins, DPM

## 2016-06-13 ENCOUNTER — Other Ambulatory Visit: Payer: Self-pay | Admitting: Allergy and Immunology

## 2016-07-04 ENCOUNTER — Other Ambulatory Visit: Payer: Self-pay | Admitting: Internal Medicine

## 2016-07-04 DIAGNOSIS — Z1231 Encounter for screening mammogram for malignant neoplasm of breast: Secondary | ICD-10-CM

## 2016-07-23 DIAGNOSIS — M5136 Other intervertebral disc degeneration, lumbar region: Secondary | ICD-10-CM | POA: Diagnosis not present

## 2016-07-23 DIAGNOSIS — M810 Age-related osteoporosis without current pathological fracture: Secondary | ICD-10-CM | POA: Diagnosis not present

## 2016-07-23 DIAGNOSIS — E119 Type 2 diabetes mellitus without complications: Secondary | ICD-10-CM | POA: Diagnosis not present

## 2016-07-23 DIAGNOSIS — Z1389 Encounter for screening for other disorder: Secondary | ICD-10-CM | POA: Diagnosis not present

## 2016-07-23 DIAGNOSIS — M069 Rheumatoid arthritis, unspecified: Secondary | ICD-10-CM | POA: Diagnosis not present

## 2016-07-23 DIAGNOSIS — M797 Fibromyalgia: Secondary | ICD-10-CM | POA: Diagnosis not present

## 2016-07-23 DIAGNOSIS — F329 Major depressive disorder, single episode, unspecified: Secondary | ICD-10-CM | POA: Diagnosis not present

## 2016-07-23 DIAGNOSIS — Z6830 Body mass index (BMI) 30.0-30.9, adult: Secondary | ICD-10-CM | POA: Diagnosis not present

## 2016-07-29 ENCOUNTER — Ambulatory Visit
Admission: RE | Admit: 2016-07-29 | Discharge: 2016-07-29 | Disposition: A | Discharge status: Home / Self Care | Payer: Medicare Other | Source: Ambulatory Visit | Attending: Internal Medicine | Admitting: Internal Medicine

## 2016-07-29 DIAGNOSIS — E119 Type 2 diabetes mellitus without complications: Secondary | ICD-10-CM | POA: Diagnosis not present

## 2016-07-29 DIAGNOSIS — Z1231 Encounter for screening mammogram for malignant neoplasm of breast: Secondary | ICD-10-CM | POA: Diagnosis not present

## 2016-07-31 ENCOUNTER — Other Ambulatory Visit: Payer: Self-pay | Admitting: Internal Medicine

## 2016-07-31 DIAGNOSIS — R928 Other abnormal and inconclusive findings on diagnostic imaging of breast: Secondary | ICD-10-CM

## 2016-08-08 ENCOUNTER — Ambulatory Visit
Admission: RE | Admit: 2016-08-08 | Discharge: 2016-08-08 | Disposition: A | Discharge status: Home / Self Care | Payer: Medicare Other | Source: Ambulatory Visit | Attending: Internal Medicine | Admitting: Internal Medicine

## 2016-08-08 DIAGNOSIS — R928 Other abnormal and inconclusive findings on diagnostic imaging of breast: Secondary | ICD-10-CM

## 2016-08-09 ENCOUNTER — Ambulatory Visit: Payer: Medicare Other | Admitting: Sports Medicine

## 2016-08-15 ENCOUNTER — Ambulatory Visit: Payer: Medicare Other | Admitting: Sports Medicine

## 2016-08-29 ENCOUNTER — Ambulatory Visit (INDEPENDENT_AMBULATORY_CARE_PROVIDER_SITE_OTHER): Payer: Medicare Other | Admitting: Sports Medicine

## 2016-08-29 DIAGNOSIS — M79676 Pain in unspecified toe(s): Secondary | ICD-10-CM

## 2016-08-29 DIAGNOSIS — M069 Rheumatoid arthritis, unspecified: Secondary | ICD-10-CM

## 2016-08-29 DIAGNOSIS — B351 Tinea unguium: Secondary | ICD-10-CM

## 2016-08-29 DIAGNOSIS — Q828 Other specified congenital malformations of skin: Secondary | ICD-10-CM

## 2016-08-29 NOTE — Progress Notes (Signed)
Subjective: [name] is a 76 y.o. female patient with history of diabetes who presents to office today complaining of callus and long, painful nails  while ambulating in shoes; unable to trim. Patient states that the glucose is doing fine. Patient denies any new changes in medication or new problems.   Patient Active Problem List   Diagnosis Date Noted  . Septic prepatellar bursitis of right knee 10/26/2014  . Prepatellar bursitis of right knee 08/28/2014  . DYSPHAGIA ORAL PHASE 06/18/2007  . ASTHMA 05/14/2007  . SLEEP APNEA 05/14/2007  . GERD 05/13/2007  . Rheumatoid arthritis ([place]) 05/13/2007  . OSTEOPOROSIS 05/13/2007  . DIARRHEA, CHRONIC 05/13/2007   Current Outpatient Prescriptions on File Prior to Visit  Medication Sig Dispense Refill  . Albuterol Sulfate (PROAIR RESPICLICK) 106 (90 Base) MCG/ACT AEPB Inhale 2 puffs into the lungs as needed (every four to six hours for cough or wheeze). 1 each 1  . atorvastatin (LIPITOR) 40 MG tablet Take 40 mg by mouth daily.     . beclomethasone (QVAR) 80 MCG/ACT inhaler Inhale 2 puffs into the lungs 2 (two) times daily. 1 Inhaler 5  . cholecalciferol (VITAMIN D) 1000 UNITS tablet Take 1,000 Units by mouth daily.    . Cyanocobalamin (B-12) 2500 MCG TABS Take 1 tablet by mouth daily.    . DULoxetine (CYMBALTA) 60 MG capsule Take 60 mg by mouth at bedtime.     . fluticasone furoate-vilanterol (BREO ELLIPTA) 200-25 MCG/INH AEPB Inhale 1 puff into the lungs daily. 60 each 5  . loperamide (IMODIUM) 2 MG capsule Take 4 mg by mouth as needed for diarrhea or loose stools.    . metFORMIN (GLUCOPHAGE) 500 MG tablet     . montelukast (SINGULAIR) 10 MG tablet TAKE ONE TABLET ONCE DAILY 90 tablet 1  . montelukast (SINGULAIR) 10 MG tablet TAKE 1 TABLET BY MOUTH ONCE DAILY 30 tablet 5  . morphine (MSIR) 30 MG tablet Take 30 mg by mouth 2 (two) times daily as needed for moderate pain or severe pain.     . Multiple Vitamin (MULTIVITAMIN WITH MINERALS) TABS  tablet Take 1 tablet by mouth daily.    . Omega-3 Fatty Acids (FISH OIL) 1200 MG CAPS Take 1 capsule by mouth daily.    . pantoprazole (PROTONIX) 40 MG tablet Take one tablet once every morning 30 tablet 5  . predniSONE (DELTASONE) 5 MG tablet Take 5 mg by mouth daily.     . ranitidine (ZANTAC) 150 MG tablet TAKE TWO TABLETS EVERY EVENING 180 tablet 1   No current facility-administered medications on file prior to visit.    Allergies  Allergen Reactions  . Penicillins Hives  . Sulfonamide Derivatives Nausea And Vomiting    REACTION: stomach problems    No results found for this or any previous visit (from the past 2160 hour(s)).  Objective: General: Patient is awake, alert, and oriented x 3 and in no acute distress.  Integument: Skin is warm, dry and supple bilateral. Nails are tender, long, thickened and  dystrophic with subungual debris, consistent with onychomycosis, 1-5 bilateral. No signs of infection. Callus plantar medial left foot and arch and right fourth toe distal tuft, very minimal today. Remaining integument unremarkable.  Vasculature:  Dorsalis Pedis pulse 1/4 bilateral. Posterior Tibial pulse  1/4 bilateral.  Capillary fill time <3 sec 1-5 bilateral. No hair growth to the level of the digits. Temperature gradient within normal limits. + varicosities present bilateral. Trace edema present bilateral.   Neurology: The  patient has diminished sensation measured with a 5.07/10g Semmes Weinstein Monofilament at all pedal sites bilateral . Vibratory sensation diminished bilateral with tuning fork. No Babinski sign present bilateral.   Musculoskeletal: Severely dislocated lesser metatarsophalangeal joints and digital deformities secondary to rheumatoid arthritis, there is significant pes planus deformity with plantar bony prominences specifically at the navicular of the left foot. Muscular strength 4/5 in all lower extremity muscular groups bilateral without pain on range of motion  . No tenderness with calf compression bilateral.  Assessment and Plan: Problem List Items Addressed This Visit      Musculoskeletal and Integument   Rheumatoid arthritis ([place])    Other Visit Diagnoses    Dermatophytosis of nail    -  Primary   Pain of toe, unspecified laterality       Porokeratosis          -Examined patient. -Discussed and educated patient on diabetic foot care, especially with  regards to the vascular, neurological and musculoskeletal systems.  -Stressed the importance of good glycemic control and the detriment of not  controlling glucose levels in relation to the foot. -Mechanically Callus 2 using rotary bur and debrided all nails 1-5 bilateral using sterile nail nipper and filed with dremel without incident  -Recommend good supportive shoes daily for foot type -Answered all patient questions -Patient to return  in 2.5 to 3 months for at risk foot care -Patient advised to call the office if any problems or questions arise in the meantime.  Landis Martins, DPM

## 2016-09-13 ENCOUNTER — Emergency Department (HOSPITAL_COMMUNITY)
Admission: EM | Admit: 2016-09-13 | Discharge: 2016-09-13 | Disposition: A | Discharge status: Home / Self Care | Payer: Medicare Other | Attending: Emergency Medicine | Admitting: Emergency Medicine

## 2016-09-13 ENCOUNTER — Emergency Department (HOSPITAL_COMMUNITY): Payer: Medicare Other

## 2016-09-13 ENCOUNTER — Encounter (HOSPITAL_COMMUNITY): Payer: Self-pay

## 2016-09-13 DIAGNOSIS — J45909 Unspecified asthma, uncomplicated: Secondary | ICD-10-CM | POA: Diagnosis not present

## 2016-09-13 DIAGNOSIS — M791 Myalgia, unspecified site: Secondary | ICD-10-CM

## 2016-09-13 DIAGNOSIS — R079 Chest pain, unspecified: Secondary | ICD-10-CM | POA: Diagnosis not present

## 2016-09-13 DIAGNOSIS — E119 Type 2 diabetes mellitus without complications: Secondary | ICD-10-CM | POA: Diagnosis not present

## 2016-09-13 DIAGNOSIS — R531 Weakness: Secondary | ICD-10-CM | POA: Diagnosis not present

## 2016-09-13 DIAGNOSIS — Z79899 Other long term (current) drug therapy: Secondary | ICD-10-CM | POA: Insufficient documentation

## 2016-09-13 DIAGNOSIS — Z7984 Long term (current) use of oral hypoglycemic drugs: Secondary | ICD-10-CM | POA: Insufficient documentation

## 2016-09-13 DIAGNOSIS — Z7982 Long term (current) use of aspirin: Secondary | ICD-10-CM | POA: Diagnosis not present

## 2016-09-13 DIAGNOSIS — Z87891 Personal history of nicotine dependence: Secondary | ICD-10-CM | POA: Insufficient documentation

## 2016-09-13 DIAGNOSIS — R404 Transient alteration of awareness: Secondary | ICD-10-CM | POA: Diagnosis not present

## 2016-09-13 LAB — CBC WITH DIFFERENTIAL/PLATELET
Basophils Absolute: 0 10*3/uL (ref 0.0–0.1)
Basophils Relative: 0 %
EOS PCT: 2 %
Eosinophils Absolute: 0.2 10*3/uL (ref 0.0–0.7)
HCT: 34.3 % — ABNORMAL LOW (ref 36.0–46.0)
Hemoglobin: 10.8 g/dL — ABNORMAL LOW (ref 12.0–15.0)
LYMPHS ABS: 1.3 10*3/uL (ref 0.7–4.0)
LYMPHS PCT: 10 %
MCH: 25.9 pg — AB (ref 26.0–34.0)
MCHC: 31.5 g/dL (ref 30.0–36.0)
MCV: 82.3 fL (ref 78.0–100.0)
MONO ABS: 1.3 10*3/uL — AB (ref 0.1–1.0)
MONOS PCT: 9 %
Neutro Abs: 11.2 10*3/uL — ABNORMAL HIGH (ref 1.7–7.7)
Neutrophils Relative %: 79 %
PLATELETS: 274 10*3/uL (ref 150–400)
RBC: 4.17 MIL/uL (ref 3.87–5.11)
RDW: 16.2 % — AB (ref 11.5–15.5)
WBC: 14.1 10*3/uL — ABNORMAL HIGH (ref 4.0–10.5)

## 2016-09-13 LAB — BASIC METABOLIC PANEL
Anion gap: 10 (ref 5–15)
BUN: 23 mg/dL — AB (ref 6–20)
CHLORIDE: 104 mmol/L (ref 101–111)
CO2: 24 mmol/L (ref 22–32)
Calcium: 8.8 mg/dL — ABNORMAL LOW (ref 8.9–10.3)
Creatinine, Ser: 0.84 mg/dL (ref 0.44–1.00)
GFR calc Af Amer: >60 mL/min (ref >60)
GFR calc non Af Amer: >60 mL/min (ref >60)
GLUCOSE: 163 mg/dL — AB (ref 65–99)
POTASSIUM: 4.8 mmol/L (ref 3.5–5.1)
Sodium: 138 mmol/L (ref 135–145)

## 2016-09-13 LAB — CK: Total CK: 77 U/L (ref 38–234)

## 2016-09-13 MED ORDER — HYDROCODONE-ACETAMINOPHEN 5-325 MG PO TABS: 1.0000 | ORAL_TABLET | Freq: Four times a day (QID) | ORAL | 0 refills | Status: DC | PRN

## 2016-09-13 MED ORDER — MORPHINE SULFATE (PF) 2 MG/ML IV SOLN
4.0000 mg | Freq: Once | INTRAVENOUS | Status: AC
  Administered 2016-09-13: 4 mg via INTRAVENOUS
  Filled 2016-09-13: qty 2

## 2016-09-13 MED ORDER — PREDNISONE 10 MG PO TABS
20.0000 mg | ORAL_TABLET | Freq: Two times a day (BID) | ORAL | 0 refills | Status: DC
Start: 2016-09-13 — End: 2017-03-09

## 2016-09-13 MED ORDER — SODIUM CHLORIDE 0.9 % IV BOLUS (SEPSIS)
1000.0000 mL | Freq: Once | INTRAVENOUS | Status: AC
  Administered 2016-09-13: 1000 mL via INTRAVENOUS

## 2016-09-13 NOTE — Discharge Instructions (Signed)
Prednisone as prescribed.  Hydrocodone as prescribed as needed for pain.  Follow-up with your rheumatologist in the next 3-4 days if not improving, and return to the ER if symptoms significantly worsen or change.

## 2016-09-13 NOTE — ED Notes (Signed)
Bed: HI34 Expected date:  Expected time:  Means of arrival:  Comments: EMS muscle weakness/pain all over

## 2016-09-13 NOTE — ED Provider Notes (Signed)
[facility] DEPT Provider Note   CSN: [id number] Arrival date & time: 09/13/16  0424     History   Chief Complaint Chief Complaint  Patient presents with  . Muscle Pain    generalized     HPI [name] is a 76 y.o. female.  Patient is a 76 year old female with history of diabetes, fibromyalgia, and rheumatoid arthritis. She presents for evaluation of diffuse muscle pain that has been ongoing for the past several days, and worsened this evening. His began in the absence of any injury or trauma or any new or strenuous activity. She denies any fevers or chills. She reports pain in her arms, legs, back, and all of her muscles.   The history is provided by the patient.  Muscle Pain  This is a new problem. The current episode started 2 days ago. The problem occurs constantly. The problem has been rapidly worsening. Pertinent negatives include no chest pain and no shortness of breath. Nothing aggravates the symptoms. Nothing relieves the symptoms. She has tried nothing for the symptoms.    Past Medical History:  Diagnosis Date  . Asthma   . Complication of anesthesia    Anesthesia told her years ago she got too cold during surgery  . Diabetes mellitus without complication ([place])   . Emphysema   . Fibromyalgia   . Osteoporosis   . Rheumatoid arthritis(714.0)   . Sleep apnea    no cpap    Patient Active Problem List   Diagnosis Date Noted  . Septic prepatellar bursitis of right knee 10/26/2014  . Prepatellar bursitis of right knee 08/28/2014  . DYSPHAGIA ORAL PHASE 06/18/2007  . ASTHMA 05/14/2007  . SLEEP APNEA 05/14/2007  . GERD 05/13/2007  . Rheumatoid arthritis ([place]) 05/13/2007  . OSTEOPOROSIS 05/13/2007  . DIARRHEA, CHRONIC 05/13/2007    Past Surgical History:  Procedure Laterality Date  . ABDOMINAL HYSTERECTOMY    . ANKLE FRACTURE SURGERY  2007  . BREAST SURGERY     mammoplasty and then removed  . FOOT SURGERY     numerous  . I&D EXTREMITY Right 10/26/2014     Procedure: IRRIGATION AND DEBRIDEMENT RIGHT KNEE ;  Surgeon: Gaynelle Arabian, MD;  Location: [facility];  Service: Orthopedics;  Laterality: Right;  . KNEE BURSECTOMY Right 09/16/2014   Procedure: EXCISON OF PRE PATELLA BURSA RIGHT KNEE ;  Surgeon: Gaynelle Arabian, MD;  Location: [facility];  Service: Orthopedics;  Laterality: Right;  . PARTIAL HYSTERECTOMY  1970  . TOTAL KNEE ARTHROPLASTY  2005 / 2006   x2    OB History    No data available       Home Medications    Prior to Admission medications   Medication Sig Start Date End Date Taking? Authorizing Provider  Albuterol Sulfate (PROAIR RESPICLICK) 716 (90 Base) MCG/ACT AEPB Inhale 2 puffs into the lungs as needed (every four to six hours for cough or wheeze). 05/13/16  Yes Kozlow, Donnamarie Poag, MD  aspirin EC 81 MG tablet Take 81 mg by mouth as directed. On Monday and thursday   Yes [provider]  atorvastatin (LIPITOR) 40 MG tablet Take 40 mg by mouth daily.  02/24/13  Yes [provider]  beclomethasone (QVAR) 80 MCG/ACT inhaler Inhale 2 puffs into the lungs 2 (two) times daily. 01/18/16  Yes Kozlow, Donnamarie Poag, MD  cholecalciferol (VITAMIN D) 1000 UNITS tablet Take 1,000 Units by mouth daily.   Yes [provider]  Cyanocobalamin (B-12) 2500 MCG TABS Take 1  tablet by mouth daily.   Yes [provider]  DULoxetine (CYMBALTA) 60 MG capsule Take 60 mg by mouth 2 (two) times daily.    Yes [provider]  fluticasone furoate-vilanterol (BREO ELLIPTA) 200-25 MCG/INH AEPB Inhale 1 puff into the lungs daily. 08/30/15  Yes Kozlow, Donnamarie Poag, MD  gabapentin (NEURONTIN) 100 MG capsule Take 100 mg by mouth at bedtime.   Yes [provider]  linagliptin (TRADJENTA) 5 MG TABS tablet Take 5 mg by mouth daily.   Yes [provider]  loratadine (CLARITIN) 10 MG tablet Take 10 mg by mouth daily.   Yes [provider]  metFORMIN (GLUCOPHAGE) 500 MG tablet Take 500 mg by mouth 2 (two) times daily with a  meal.  01/04/16  Yes [provider]  montelukast (SINGULAIR) 10 MG tablet TAKE ONE TABLET ONCE DAILY 01/29/16  Yes Kozlow, Donnamarie Poag, MD  montelukast (SINGULAIR) 10 MG tablet TAKE 1 TABLET BY MOUTH ONCE DAILY 06/13/16  Yes Kozlow, Donnamarie Poag, MD  Omega-3 Fatty Acids (FISH OIL) 1200 MG CAPS Take 1 capsule by mouth daily.   Yes [provider]  oxyCODONE-acetaminophen (PERCOCET) 7.5-325 MG tablet Take 1 tablet by mouth every 6 (six) hours as needed for severe pain.   Yes [provider]  pantoprazole (PROTONIX) 40 MG tablet Take one tablet once every morning 10/18/15  Yes Kozlow, Donnamarie Poag, MD  predniSONE (DELTASONE) 5 MG tablet Take 5 mg by mouth daily.  10/31/09  Yes [provider]  ranitidine (ZANTAC) 150 MG tablet TAKE TWO TABLETS EVERY EVENING 01/23/16  Yes Kozlow, Donnamarie Poag, MD    Family History Family History  Problem Relation Age of Onset  . Hypertension Unknown   . Diabetes Unknown   . Stroke Unknown   . Diabetes Mother   . Diabetes Father   . Diabetes Sister   . Diabetes Maternal Aunt   . Cancer Paternal Uncle   . Cancer Paternal Grandmother   . Breast cancer Paternal Grandmother     Social History Social History  Substance Use Topics  . Smoking status: Former Smoker    Quit date: 03/11/1994  . Smokeless tobacco: Never Used  . Alcohol use No     Allergies   Penicillins and Sulfonamide derivatives   Review of Systems Review of Systems  Respiratory: Negative for shortness of breath.   Cardiovascular: Negative for chest pain.  All other systems reviewed and are negative.    Physical Exam Updated Vital Signs BP (!) 130/59 (BP Location: Left Arm)   Pulse 100   Temp 98.8 F (37.1 C) (Oral)   Resp 19   Ht 5\' 5"  (1.651 m)   Wt 88.5 kg (195 lb)   BMI 32.45 kg/m   Physical Exam  Constitutional: She is oriented to person, place, and time. She appears well-developed and well-nourished. No distress.  HENT:  Head: Normocephalic and atraumatic.   Neck: Normal range of motion. Neck supple.  Cardiovascular: Normal rate and regular rhythm.  Exam reveals no gallop and no friction rub.   No murmur heard. Pulmonary/Chest: Effort normal and breath sounds normal. No respiratory distress. She has no wheezes.  Abdominal: Soft. Bowel sounds are normal. She exhibits no distension. There is no tenderness.  Musculoskeletal: Normal range of motion.  Patient has degenerative joints with ulnar deviation of her fingers related to rheumatoid.  Neurological: She is alert and oriented to person, place, and time.  Skin: Skin is warm and dry. She is not diaphoretic.  Nursing note and vitals reviewed.    ED Treatments / Results  Labs (all labs ordered are listed, but only abnormal results are displayed) Labs Reviewed  BASIC METABOLIC PANEL - Abnormal; Notable for the following:       Result Value   Glucose, Bld 163 (*)    BUN 23 (*)    Calcium 8.8 (*)    All other components within normal limits  CBC WITH DIFFERENTIAL/PLATELET - Abnormal; Notable for the following:    WBC 14.1 (*)    Hemoglobin 10.8 (*)    HCT 34.3 (*)    MCH 25.9 (*)    RDW 16.2 (*)    Neutro Abs 11.2 (*)    Monocytes Absolute 1.3 (*)    All other components within normal limits  CK    EKG  EKG Interpretation None       Radiology Dg Chest 2 View  Result Date: 09/13/2016 CLINICAL DATA:  Acute onset of generalized weakness and chest pain. Initial encounter. EXAM: CHEST  2 VIEW COMPARISON:  Chest radiograph performed 08/30/2015 FINDINGS: The lungs are well-aerated and clear. There is no evidence of focal opacification, pleural effusion or pneumothorax. The heart is normal in size; the mediastinal contour is within normal limits. No acute osseous abnormalities are seen. IMPRESSION: No acute cardiopulmonary process seen. Electronically Signed   By: Garald Balding M.D.   On: 09/13/2016 05:20    Procedures Procedures (including critical care time)  Medications Ordered  in ED Medications  sodium chloride 0.9 % bolus 1,000 mL (1,000 mLs Intravenous New Bag/Given 09/13/16 0507)  morphine 2 MG/ML injection 4 mg (4 mg Intravenous Given 09/13/16 0501)     Initial Impression / Assessment and Plan / ED Course  I have reviewed the triage vital signs and the nursing notes.  Pertinent labs & imaging results that were available during my care of the patient were reviewed by me and considered in my medical decision making (see chart for details).  Patient was given IV fluids and pain medication and is feeling better.  I am uncertain as to the exact etiology of her discomfort, however I suspect some sort of autoimmune/rheumatologic cause. She will be treated with prednisone, pain medicine, and is to follow-up with her rheumatologist in the next few days if not improving.  Final Clinical Impressions(s) / ED Diagnoses   Final diagnoses:  None    New Prescriptions New Prescriptions   No medications on file     Veryl Speak, MD 09/13/16 [phone]

## 2016-09-13 NOTE — ED Triage Notes (Signed)
Pt come in via [facility], c/o muscle pain generalized and weak for 3 days. Pt took her gabapentin and oxycodone from a friend. Still in pain.

## 2016-09-13 NOTE — ED Triage Notes (Signed)
Pt brought in by EMS from home c/o  muscle weakness and pain x 3 days. Per EMS pt has h/o of neuropathy.   CBG 199   BP 140/80, HR 100, RR 14, O2 sat 99% RA

## 2016-10-28 DIAGNOSIS — E784 Other hyperlipidemia: Secondary | ICD-10-CM | POA: Diagnosis not present

## 2016-10-28 DIAGNOSIS — Z0000 Encounter for general adult medical examination without abnormal findings: Secondary | ICD-10-CM | POA: Diagnosis not present

## 2016-10-28 DIAGNOSIS — M069 Rheumatoid arthritis, unspecified: Secondary | ICD-10-CM | POA: Diagnosis not present

## 2016-10-28 DIAGNOSIS — M810 Age-related osteoporosis without current pathological fracture: Secondary | ICD-10-CM | POA: Diagnosis not present

## 2016-10-28 DIAGNOSIS — E119 Type 2 diabetes mellitus without complications: Secondary | ICD-10-CM | POA: Diagnosis not present

## 2016-11-04 DIAGNOSIS — J449 Chronic obstructive pulmonary disease, unspecified: Secondary | ICD-10-CM | POA: Diagnosis not present

## 2016-11-04 DIAGNOSIS — D801 Nonfamilial hypogammaglobulinemia: Secondary | ICD-10-CM | POA: Diagnosis not present

## 2016-11-04 DIAGNOSIS — Z0000 Encounter for general adult medical examination without abnormal findings: Secondary | ICD-10-CM | POA: Diagnosis not present

## 2016-11-04 DIAGNOSIS — E119 Type 2 diabetes mellitus without complications: Secondary | ICD-10-CM | POA: Diagnosis not present

## 2016-11-04 DIAGNOSIS — I700 Atherosclerosis of aorta: Secondary | ICD-10-CM | POA: Diagnosis not present

## 2016-11-04 DIAGNOSIS — Z6829 Body mass index (BMI) 29.0-29.9, adult: Secondary | ICD-10-CM | POA: Diagnosis not present

## 2016-11-04 DIAGNOSIS — N281 Cyst of kidney, acquired: Secondary | ICD-10-CM | POA: Diagnosis not present

## 2016-11-04 DIAGNOSIS — M5136 Other intervertebral disc degeneration, lumbar region: Secondary | ICD-10-CM | POA: Diagnosis not present

## 2016-11-04 DIAGNOSIS — K862 Cyst of pancreas: Secondary | ICD-10-CM | POA: Diagnosis not present

## 2016-11-04 DIAGNOSIS — Z1321 Encounter for screening for nutritional disorder: Secondary | ICD-10-CM | POA: Diagnosis not present

## 2016-11-04 DIAGNOSIS — J45998 Other asthma: Secondary | ICD-10-CM | POA: Diagnosis not present

## 2016-11-04 DIAGNOSIS — Z1212 Encounter for screening for malignant neoplasm of rectum: Secondary | ICD-10-CM | POA: Diagnosis not present

## 2016-11-04 DIAGNOSIS — Z1389 Encounter for screening for other disorder: Secondary | ICD-10-CM | POA: Diagnosis not present

## 2016-11-14 DIAGNOSIS — R0789 Other chest pain: Secondary | ICD-10-CM | POA: Diagnosis not present

## 2016-11-14 DIAGNOSIS — R0609 Other forms of dyspnea: Secondary | ICD-10-CM | POA: Diagnosis not present

## 2016-11-14 DIAGNOSIS — E668 Other obesity: Secondary | ICD-10-CM | POA: Diagnosis not present

## 2016-11-14 DIAGNOSIS — I10 Essential (primary) hypertension: Secondary | ICD-10-CM | POA: Diagnosis not present

## 2016-11-18 DIAGNOSIS — R0789 Other chest pain: Secondary | ICD-10-CM | POA: Diagnosis not present

## 2016-11-18 DIAGNOSIS — I10 Essential (primary) hypertension: Secondary | ICD-10-CM | POA: Diagnosis not present

## 2016-11-18 DIAGNOSIS — R0602 Shortness of breath: Secondary | ICD-10-CM | POA: Diagnosis not present

## 2016-11-18 DIAGNOSIS — E119 Type 2 diabetes mellitus without complications: Secondary | ICD-10-CM | POA: Diagnosis not present

## 2016-11-19 DIAGNOSIS — R0609 Other forms of dyspnea: Secondary | ICD-10-CM | POA: Diagnosis not present

## 2016-11-19 DIAGNOSIS — R0789 Other chest pain: Secondary | ICD-10-CM | POA: Diagnosis not present

## 2016-11-19 DIAGNOSIS — I10 Essential (primary) hypertension: Secondary | ICD-10-CM | POA: Diagnosis not present

## 2016-11-25 DIAGNOSIS — Z1212 Encounter for screening for malignant neoplasm of rectum: Secondary | ICD-10-CM | POA: Diagnosis not present

## 2016-11-25 DIAGNOSIS — Z1211 Encounter for screening for malignant neoplasm of colon: Secondary | ICD-10-CM | POA: Diagnosis not present

## 2016-11-28 DIAGNOSIS — I10 Essential (primary) hypertension: Secondary | ICD-10-CM | POA: Diagnosis not present

## 2016-11-28 DIAGNOSIS — Z0189 Encounter for other specified special examinations: Secondary | ICD-10-CM | POA: Diagnosis not present

## 2016-11-28 DIAGNOSIS — R0609 Other forms of dyspnea: Secondary | ICD-10-CM | POA: Diagnosis not present

## 2016-11-28 DIAGNOSIS — R0789 Other chest pain: Secondary | ICD-10-CM | POA: Diagnosis not present

## 2016-12-04 ENCOUNTER — Ambulatory Visit (INDEPENDENT_AMBULATORY_CARE_PROVIDER_SITE_OTHER): Payer: Medicare Other | Admitting: Sports Medicine

## 2016-12-04 ENCOUNTER — Encounter (INDEPENDENT_AMBULATORY_CARE_PROVIDER_SITE_OTHER): Payer: Self-pay

## 2016-12-04 DIAGNOSIS — B351 Tinea unguium: Secondary | ICD-10-CM

## 2016-12-04 DIAGNOSIS — M21962 Unspecified acquired deformity of left lower leg: Secondary | ICD-10-CM

## 2016-12-04 DIAGNOSIS — M79676 Pain in unspecified toe(s): Secondary | ICD-10-CM | POA: Diagnosis not present

## 2016-12-04 DIAGNOSIS — M069 Rheumatoid arthritis, unspecified: Secondary | ICD-10-CM

## 2016-12-04 DIAGNOSIS — M21961 Unspecified acquired deformity of right lower leg: Secondary | ICD-10-CM | POA: Diagnosis not present

## 2016-12-04 DIAGNOSIS — E1142 Type 2 diabetes mellitus with diabetic polyneuropathy: Secondary | ICD-10-CM

## 2016-12-04 NOTE — Progress Notes (Signed)
Subjective: [name] is a 76 y.o. female patient with history of diabetes who returns to office today complaining of callus and long, painful nails  while ambulating in shoes; unable to trim. Patient states that the glucose is not doing good with last seen by her doctor a few weeks ago and may have to be put on insulin last hemoglobin A1c was around 8.1-8.7. Patient denies any new changes in medication or new problems at this time.   Patient Active Problem List   Diagnosis Date Noted  . Septic prepatellar bursitis of right knee 10/26/2014  . Prepatellar bursitis of right knee 08/28/2014  . DYSPHAGIA ORAL PHASE 06/18/2007  . ASTHMA 05/14/2007  . SLEEP APNEA 05/14/2007  . GERD 05/13/2007  . Rheumatoid arthritis ([place]) 05/13/2007  . OSTEOPOROSIS 05/13/2007  . DIARRHEA, CHRONIC 05/13/2007   Current Outpatient Prescriptions on File Prior to Visit  Medication Sig Dispense Refill  . Albuterol Sulfate (PROAIR RESPICLICK) 025 (90 Base) MCG/ACT AEPB Inhale 2 puffs into the lungs as needed (every four to six hours for cough or wheeze). 1 each 1  . aspirin EC 81 MG tablet Take 81 mg by mouth as directed. On Monday and thursday    . atorvastatin (LIPITOR) 40 MG tablet Take 40 mg by mouth daily.     . beclomethasone (QVAR) 80 MCG/ACT inhaler Inhale 2 puffs into the lungs 2 (two) times daily. 1 Inhaler 5  . cholecalciferol (VITAMIN D) 1000 UNITS tablet Take 1,000 Units by mouth daily.    . Cyanocobalamin (B-12) 2500 MCG TABS Take 1 tablet by mouth daily.    . DULoxetine (CYMBALTA) 60 MG capsule Take 60 mg by mouth 2 (two) times daily.     . fluticasone furoate-vilanterol (BREO ELLIPTA) 200-25 MCG/INH AEPB Inhale 1 puff into the lungs daily. 60 each 5  . gabapentin (NEURONTIN) 100 MG capsule Take 100 mg by mouth at bedtime.    Marland Kitchen HYDROcodone-acetaminophen (NORCO) 5-325 MG tablet Take 1-2 tablets by mouth every 6 (six) hours as needed. 15 tablet 0  . linagliptin (TRADJENTA) 5 MG TABS tablet Take 5 mg  by mouth daily.    Marland Kitchen loratadine (CLARITIN) 10 MG tablet Take 10 mg by mouth daily.    . metFORMIN (GLUCOPHAGE) 500 MG tablet Take 500 mg by mouth 2 (two) times daily with a meal.     . montelukast (SINGULAIR) 10 MG tablet TAKE ONE TABLET ONCE DAILY 90 tablet 1  . montelukast (SINGULAIR) 10 MG tablet TAKE 1 TABLET BY MOUTH ONCE DAILY 30 tablet 5  . Omega-3 Fatty Acids (FISH OIL) 1200 MG CAPS Take 1 capsule by mouth daily.    Marland Kitchen oxyCODONE-acetaminophen (PERCOCET) 7.5-325 MG tablet Take 1 tablet by mouth every 6 (six) hours as needed for severe pain.    . pantoprazole (PROTONIX) 40 MG tablet Take one tablet once every morning 30 tablet 5  . predniSONE (DELTASONE) 10 MG tablet Take 2 tablets (20 mg total) by mouth 2 (two) times daily with a meal. 20 tablet 0  . ranitidine (ZANTAC) 150 MG tablet TAKE TWO TABLETS EVERY EVENING 180 tablet 1   No current facility-administered medications on file prior to visit.    Allergies  Allergen Reactions  . Penicillins Hives  . Sulfonamide Derivatives Nausea And Vomiting    REACTION: stomach problems    Recent Results (from the past 2160 hour(s))  Basic metabolic panel     Status: Abnormal   Collection Time: 09/13/16  4:43 AM  Result Value Ref  Range   Sodium 138 135 - 145 mmol/L   Potassium 4.8 3.5 - 5.1 mmol/L   Chloride 104 101 - 111 mmol/L   CO2 24 22 - 32 mmol/L   Glucose, Bld 163 (H) 65 - 99 mg/dL   BUN 23 (H) 6 - 20 mg/dL   Creatinine, Ser 0.84 0.44 - 1.00 mg/dL   Calcium 8.8 (L) 8.9 - 10.3 mg/dL   GFR calc non Af Amer >60 >60 mL/min   GFR calc Af Amer >60 >60 mL/min    Comment: (NOTE) The eGFR has been calculated using the CKD EPI equation. This calculation has not been validated in all clinical situations. eGFR's persistently <60 mL/min signify possible Chronic Kidney Disease.    Anion gap 10 5 - 15  CBC with Differential     Status: Abnormal   Collection Time: 09/13/16  4:43 AM  Result Value Ref Range   WBC 14.1 (H) 4.0 - 10.5 K/uL    RBC 4.17 3.87 - 5.11 MIL/uL   Hemoglobin 10.8 (L) 12.0 - 15.0 g/dL   HCT 34.3 (L) 36.0 - 46.0 %   MCV 82.3 78.0 - 100.0 fL   MCH 25.9 (L) 26.0 - 34.0 pg   MCHC 31.5 30.0 - 36.0 g/dL   RDW 16.2 (H) 11.5 - 15.5 %   Platelets 274 150 - 400 K/uL   Neutrophils Relative % 79 %   Neutro Abs 11.2 (H) 1.7 - 7.7 K/uL   Lymphocytes Relative 10 %   Lymphs Abs 1.3 0.7 - 4.0 K/uL   Monocytes Relative 9 %   Monocytes Absolute 1.3 (H) 0.1 - 1.0 K/uL   Eosinophils Relative 2 %   Eosinophils Absolute 0.2 0.0 - 0.7 K/uL   Basophils Relative 0 %   Basophils Absolute 0.0 0.0 - 0.1 K/uL  CK     Status: None   Collection Time: 09/13/16  4:43 AM  Result Value Ref Range   Total CK 77 38 - 234 U/L    Objective: General: Patient is awake, alert, and oriented x 3 and in no acute distress.  Integument: Skin is warm, dry and supple bilateral. Nails are tender, long, thickened and  dystrophic with subungual debris, consistent with onychomycosis, 1-5 bilateral. No signs of infection. Callus plantar medial left foot and arch and right fourth toe distal tuft, very minimal today. Remaining integument unremarkable.  Vasculature:  Dorsalis Pedis pulse 1/4 bilateral. Posterior Tibial pulse  1/4 bilateral.  Capillary fill time <3 sec 1-5 bilateral. No hair growth to the level of the digits. Temperature gradient within normal limits. + varicosities present bilateral. Trace edema present bilateral.   Neurology: The patient has diminished sensation measured with a 5.07/10g Semmes Weinstein Monofilament at all pedal sites bilateral . Vibratory sensation diminished bilateral with tuning fork. No Babinski sign present bilateral.   Musculoskeletal: Severely dislocated lesser metatarsophalangeal joints and digital deformities secondary to rheumatoid arthritis, there is significant pes planus deformity with plantar bony prominences specifically at the navicular of the left foot. Muscular strength 4/5 in all lower extremity  muscular groups bilateral without pain on range of motion . No tenderness with calf compression bilateral.  Assessment and Plan: Problem List Items Addressed This Visit      Musculoskeletal and Integument   Rheumatoid arthritis ([place])    Other Visit Diagnoses    Dermatophytosis of nail    -  Primary   Pain of toe, unspecified laterality       Diabetic polyneuropathy associated with type  2 diabetes mellitus (HCC)       Foot deformity, bilateral          -Examined patient. -Discussed and educated patient on diabetic foot care, especially with  regards to the vascular, neurological and musculoskeletal systems.  -Stressed the importance of good glycemic control and the detriment of not  controlling glucose levels in relation to the foot. -Mechanically Callus 2 using rotary bur and debrided all nails 1-5 bilateral using sterile nail nipper and filed with dremel without incident  -Recommend good supportive shoes daily for foot type -Answered all patient questions -Patient to return  in 2.5 months for at risk foot care -Patient advised to call the office if any problems or questions arise in the meantime.  Landis Martins, DPM

## 2016-12-24 ENCOUNTER — Telehealth: Payer: Self-pay | Admitting: Pulmonary Disease

## 2016-12-24 ENCOUNTER — Encounter: Payer: Self-pay | Admitting: Pulmonary Disease

## 2016-12-24 ENCOUNTER — Other Ambulatory Visit: Payer: Self-pay

## 2016-12-24 ENCOUNTER — Other Ambulatory Visit (INDEPENDENT_AMBULATORY_CARE_PROVIDER_SITE_OTHER): Payer: Medicare Other

## 2016-12-24 ENCOUNTER — Ambulatory Visit (INDEPENDENT_AMBULATORY_CARE_PROVIDER_SITE_OTHER): Payer: Medicare Other | Admitting: Pulmonary Disease

## 2016-12-24 VITALS — BP 110/60 | HR 101 | Ht 65.0 in | Wt 182.6 lb

## 2016-12-24 DIAGNOSIS — R0689 Other abnormalities of breathing: Secondary | ICD-10-CM

## 2016-12-24 DIAGNOSIS — J849 Interstitial pulmonary disease, unspecified: Secondary | ICD-10-CM | POA: Diagnosis not present

## 2016-12-24 DIAGNOSIS — N390 Urinary tract infection, site not specified: Secondary | ICD-10-CM

## 2016-12-24 DIAGNOSIS — M069 Rheumatoid arthritis, unspecified: Secondary | ICD-10-CM | POA: Diagnosis not present

## 2016-12-24 DIAGNOSIS — R0600 Dyspnea, unspecified: Secondary | ICD-10-CM | POA: Diagnosis not present

## 2016-12-24 LAB — URINALYSIS
Bilirubin Urine: NEGATIVE
Hgb urine dipstick: NEGATIVE
LEUKOCYTES UA: NEGATIVE
Nitrite: NEGATIVE
Specific Gravity, Urine: 1.025 (ref 1.000–1.030)
URINE GLUCOSE: NEGATIVE
UROBILINOGEN UA: 0.2 (ref 0.0–1.0)
pH: 6 (ref 5.0–8.0)

## 2016-12-24 LAB — NITRIC OXIDE: NITRIC OXIDE: 22

## 2016-12-24 NOTE — Telephone Encounter (Signed)
Called Dr. Silvestre Mesi office and to tell him that we saw pt today that they referred to our office and we scheduled her to have a CT scan to be done that pt had wanted to postpone a CT of abdomen that Dr. Joylene Draft wanted her to have done but will now have it done since we are ordering this CT. Left message with  Juliann Pulse? Dr. Silvestre Mesi CMA to call us back if she had any questions done regarding the message I left her. Nothing further needed at this time.

## 2016-12-24 NOTE — Progress Notes (Signed)
[name]    [id number]    [birth year]  Primary Care Physician:Perini, Elta Guadeloupe, MD  Referring Physician: [name], [address], [place] [id number]  Chief complaint:  Consult for evaluation of dyspnea  HPI: 76 year old with history of fibromyalgia, osteoporosis, asthma, COPD, hypertension, hyperlipidemia and sleep apnea. Referred for evaluation of dyspnea. Chief complaint is dyspnea on exertion. Denies any cough, sputum production, wheezing, fevers, chills. She feels she may have a UTI due to increasing frequency of urination and burning sensation.  She has been evaluated by Dr. Einar Gip, cardiology with an echo and nuclear stress test which were normal. Has history of rheumatoid arthritis. She was previously treated with multiple therapies since 1986 including methotrexate, Plaquenil, Arava, Remicade, prednisone, leflunomide, orencia, humira, enbrel. Most recently she was also on Rituxan from 2007-2017 which had to be stopped as she developed hypogammaglobulinemia. She is currently on chronic prednisone at 5 mg and follows with Dr. Neldon Mc, Immunology and Rheumatology at [facility]. Her rheumatoid arthritis is not felt to be active at this point.  She has history of asthma and has been prescribed Qvar and Breo but is taking it only intermittently due to the cost of the inhaler. Does not have daily symptoms. She has history of OSA but is intolerant of CPAP.  Outpatient Encounter Prescriptions as of 12/24/2016  Medication Sig  . Albuterol Sulfate (PROAIR RESPICLICK) 401 (90 Base) MCG/ACT AEPB Inhale 2 puffs into the lungs as needed (every four to six hours for cough or wheeze).  Marland Kitchen aspirin EC 81 MG tablet Take 81 mg by mouth as directed. On Monday and thursday  . atorvastatin (LIPITOR) 40 MG tablet Take 40 mg by mouth daily.   . beclomethasone (QVAR) 80 MCG/ACT inhaler Inhale 2 puffs into the lungs 2 (two) times daily.  . cholecalciferol (VITAMIN D) 1000 UNITS tablet Take 1,000  Units by mouth daily.  . Cyanocobalamin (B-12) 2500 MCG TABS Take 1 tablet by mouth daily.  . DULoxetine (CYMBALTA) 60 MG capsule Take 60 mg by mouth 2 (two) times daily.   . fluticasone furoate-vilanterol (BREO ELLIPTA) 200-25 MCG/INH AEPB Inhale 1 puff into the lungs daily.  Marland Kitchen gabapentin (NEURONTIN) 100 MG capsule Take 100 mg by mouth at bedtime.  Marland Kitchen linagliptin (TRADJENTA) 5 MG TABS tablet Take 5 mg by mouth daily.  Marland Kitchen loratadine (CLARITIN) 10 MG tablet Take 10 mg by mouth daily.  . metFORMIN (GLUCOPHAGE) 500 MG tablet Take 500 mg by mouth 2 (two) times daily with a meal.   . montelukast (SINGULAIR) 10 MG tablet TAKE ONE TABLET ONCE DAILY  . Omega-3 Fatty Acids (FISH OIL) 1200 MG CAPS Take 1 capsule by mouth daily.  . pantoprazole (PROTONIX) 40 MG tablet Take one tablet once every morning  . predniSONE (DELTASONE) 10 MG tablet Take 2 tablets (20 mg total) by mouth 2 (two) times daily with a meal.  . ranitidine (ZANTAC) 150 MG tablet TAKE TWO TABLETS EVERY EVENING  . ezetimibe (ZETIA) 10 MG tablet   . [DISCONTINUED] HYDROcodone-acetaminophen (NORCO) 5-325 MG tablet Take 1-2 tablets by mouth every 6 (six) hours as needed.  . [DISCONTINUED] montelukast (SINGULAIR) 10 MG tablet TAKE 1 TABLET BY MOUTH ONCE DAILY  . [DISCONTINUED] oxyCODONE-acetaminophen (PERCOCET) 7.5-325 MG tablet Take 1 tablet by mouth every 6 (six) hours as needed for severe pain.   No facility-administered encounter medications on file as of 12/24/2016.     Allergies as of 12/24/2016 - Review Complete 12/24/2016  Allergen Reaction Noted  . Penicillins Hives   . Sulfonamide derivatives Nausea And Vomiting     Past Medical History:  Diagnosis Date  . Asthma   . Complication of anesthesia    Anesthesia told her years ago she got too cold during surgery  . Diabetes mellitus without complication ([place])   . Emphysema   . Fibromyalgia   . Osteoporosis   . Rheumatoid arthritis(714.0)   . Sleep apnea    no cpap    Past  Surgical History:  Procedure Laterality Date  . ABDOMINAL HYSTERECTOMY    . ANKLE FRACTURE SURGERY  2007  . BREAST SURGERY     mammoplasty and then removed  . FOOT SURGERY     numerous  . I&D EXTREMITY Right 10/26/2014   Procedure: IRRIGATION AND DEBRIDEMENT RIGHT KNEE ;  Surgeon: Gaynelle Arabian, MD;  Location: [facility];  Service: Orthopedics;  Laterality: Right;  . KNEE BURSECTOMY Right 09/16/2014   Procedure: EXCISON OF PRE PATELLA BURSA RIGHT KNEE ;  Surgeon: Gaynelle Arabian, MD;  Location: [facility];  Service: Orthopedics;  Laterality: Right;  . PARTIAL HYSTERECTOMY  1970  . TOTAL KNEE ARTHROPLASTY  2005 / 2006   x2    Family History  Problem Relation Age of Onset  . Hypertension Unknown   . Diabetes Unknown   . Stroke Unknown   . Diabetes Mother   . Diabetes Father   . Diabetes Sister   . Diabetes Maternal Aunt   . Cancer Paternal Uncle   . Cancer Paternal Grandmother   . Breast cancer Paternal Grandmother     Social History   Social History  . Marital status: Divorced    Spouse name: N/A  . Number of children: N/A  . Years of education: N/A   Occupational History  . Not on file.   Social History Main Topics  . Smoking status: Former Smoker    Quit date: 03/11/1994  . Smokeless tobacco: Never Used  . Alcohol use No  . Drug use: No  . Sexual activity: Not on file   Other Topics Concern  . Not on file   Social History Narrative  . No narrative on file    Review of systems: Review of Systems  Constitutional: Negative for fever and chills.  HENT: Negative.   Eyes: Negative for blurred vision.  Respiratory: as per HPI  Cardiovascular: Negative for chest pain and palpitations.  Gastrointestinal: Negative for vomiting, diarrhea, blood per rectum. Genitourinary: Positive for dysuria, urgency, frequency Musculoskeletal: Negative for myalgias, back pain and joint pain.  Skin: Negative for itching and rash.  Neurological: Negative for dizziness, tremors, focal  weakness, seizures and loss of consciousness.  Endo/Heme/Allergies: Negative for environmental allergies.  Psychiatric/Behavioral: Negative for depression, suicidal ideas and hallucinations.  All other systems reviewed and are negative.  Physical Exam: Blood pressure 110/60, pulse (!) 101, height 5\' 5"  (1.651 m), weight 182 lb 9.6 oz (82.8 kg), SpO2 95 %. Gen:      No acute distress HEENT:  EOMI, sclera anicteric Neck:     No masses; no thyromegaly Lungs:    Clear to auscultation bilaterally; normal respiratory effort CV:         Regular rate and rhythm; no murmurs Abd:      + bowel sounds; soft, non-tender; no palpable masses, no distension Ext:    No edema; adequate peripheral perfusion Skin:      Warm and dry; no rash Neuro: alert and oriented x 3 Psych: normal  mood and affect  Data Reviewed: Chest x-ray 09/13/16-no acute cardiopulmonary process FENO 12/24/16- 22  Spirometry 05/13/16 FVC 2.31 [74%], FEV1 1.97 (84%], F/F 85 No obstruction, restriction possible.  Echo 11/19/16-normal LV cavity, mild concentric hypertrophy, grade 1 diastolic dysfunction, EF 66%. Mild MR, TR. Normal size and function of the right ventricle. PASP  25-30  Nuclear stress test 11/18/16-low risk study.  Assessment:  Consult for evaluation of dyspnea Asthma does not appear to be very active and she not taking inhalers on a regular basis. FENO is low She has significant history of rheumatoid arthritis and has been on multiple treatments in the past. She'll need evaluation for rheumatoid arthritis associated interstitial lung disease. Previous spirometry noted for possible restriction We will get a high-resolution CT and full pulmonary function testing Including lung capacity and diffusion for further evaluation. She'll like CT chest to be coordinated with imaging of the abdomen, which was to be ordered by Dr. Joylene Draft for evaluation of renal and pancreatic lesions.  She's had a full cardiac eval with normal  studies. Echo does not show any evidence of pulmonary hypertension.  Possible UTI She is requesting an evaluation   Plan/Recommendations: - High-resolution CT of the chest, PFTs - Coordinate lung imaging with planned abdomen imaging. - Check UA, urine culture  Marshell Garfinkel MD [facility] Pager [phone] 12/24/2016, 11:12 AM   CC: [name], MD

## 2016-12-24 NOTE — Patient Instructions (Addendum)
We'll evaluate your dyspnea with high-resolution CT of the chest and pulmonary function test Follow-up in 1-2 months.

## 2016-12-25 LAB — URINE CULTURE
MICRO NUMBER:: 81152786
SPECIMEN QUALITY: ADEQUATE

## 2016-12-26 ENCOUNTER — Telehealth: Payer: Self-pay | Admitting: Pulmonary Disease

## 2016-12-26 NOTE — Telephone Encounter (Addendum)
There is no evidence of a urinary tract infection. There are trace ketones which may indicate her sugars are high. I have asked her to call her primary care office and get this checked out. Please send the results of UA and Ucx to Dr. Joylene Draft. Thanks

## 2016-12-26 NOTE — Telephone Encounter (Signed)
Patient is requesting results from UA on 10/16.   PM, please advise. Thanks!

## 2016-12-27 NOTE — Telephone Encounter (Signed)
Labs have been sent over to her PCP---Dr. Joylene Draft.

## 2017-01-09 DIAGNOSIS — K862 Cyst of pancreas: Secondary | ICD-10-CM | POA: Diagnosis not present

## 2017-01-09 DIAGNOSIS — J984 Other disorders of lung: Secondary | ICD-10-CM | POA: Diagnosis not present

## 2017-01-09 DIAGNOSIS — J849 Interstitial pulmonary disease, unspecified: Secondary | ICD-10-CM | POA: Diagnosis not present

## 2017-01-09 DIAGNOSIS — J479 Bronchiectasis, uncomplicated: Secondary | ICD-10-CM | POA: Diagnosis not present

## 2017-01-14 ENCOUNTER — Telehealth: Payer: Self-pay | Admitting: Pulmonary Disease

## 2017-01-14 NOTE — Telephone Encounter (Signed)
Pt aware that we have not received CT report. Pt states she has CT disc. Pt advised to bring disc to [facility] tomorrow.  Lm for [facility] imaging and requested that CT report be faxed over.

## 2017-01-15 ENCOUNTER — Ambulatory Visit (INDEPENDENT_AMBULATORY_CARE_PROVIDER_SITE_OTHER): Payer: Medicare Other | Admitting: Pulmonary Disease

## 2017-01-15 ENCOUNTER — Encounter: Payer: Self-pay | Admitting: Pulmonary Disease

## 2017-01-15 VITALS — BP 124/80 | HR 94 | Ht 65.0 in | Wt 177.0 lb

## 2017-01-15 DIAGNOSIS — J849 Interstitial pulmonary disease, unspecified: Secondary | ICD-10-CM | POA: Diagnosis not present

## 2017-01-15 DIAGNOSIS — M069 Rheumatoid arthritis, unspecified: Secondary | ICD-10-CM

## 2017-01-15 DIAGNOSIS — J439 Emphysema, unspecified: Secondary | ICD-10-CM

## 2017-01-15 DIAGNOSIS — S82892S Other fracture of left lower leg, sequela: Secondary | ICD-10-CM

## 2017-01-15 DIAGNOSIS — M2012 Hallux valgus (acquired), left foot: Secondary | ICD-10-CM | POA: Diagnosis not present

## 2017-01-15 LAB — PULMONARY FUNCTION TEST
DL/VA % pred: 68 %
DL/VA: 3.38 ml/min/mmHg/L
DLCO COR % PRED: 60 %
DLCO cor: 15.41 ml/min/mmHg
DLCO unc % pred: 58 %
DLCO unc: 14.92 ml/min/mmHg
FEF 25-75 POST: 3.11 L/s
FEF 25-75 Pre: 2.79 L/sec
FEF2575-%Change-Post: 11 %
FEF2575-%Pred-Post: 184 %
FEF2575-%Pred-Pre: 165 %
FEV1-%CHANGE-POST: 3 %
FEV1-%Pred-Post: 115 %
FEV1-%Pred-Pre: 112 %
FEV1-POST: 2.53 L
FEV1-Pre: 2.46 L
FEV1FVC-%Change-Post: 5 %
FEV1FVC-%PRED-PRE: 111 %
FEV6-%Change-Post: -1 %
FEV6-%PRED-PRE: 105 %
FEV6-%Pred-Post: 103 %
FEV6-POST: 2.89 L
FEV6-PRE: 2.94 L
FEV6FVC-%Change-Post: 0 %
FEV6FVC-%PRED-POST: 105 %
FEV6FVC-%PRED-PRE: 104 %
FVC-%CHANGE-POST: -1 %
FVC-%PRED-POST: 99 %
FVC-%PRED-PRE: 101 %
FVC-POST: 2.9 L
FVC-PRE: 2.95 L
POST FEV6/FVC RATIO: 100 %
PRE FEV6/FVC RATIO: 99 %
Post FEV1/FVC ratio: 87 %
Pre FEV1/FVC ratio: 83 %
RV % PRED: 79 %
RV: 1.86 L
TLC % PRED: 96 %
TLC: 5.04 L

## 2017-01-15 NOTE — Progress Notes (Signed)
[name]    [id number]    Nov 08, 1940  Primary Care Physician:Perini, Elta Guadeloupe, MD  Referring Physician: [name], [address] [place], [place] [address]  Chief complaint:   Follow-up for dyspnea Emphysema RA-ILD  HPI: 76 year old with history of fibromyalgia, osteoporosis, asthma, COPD, hypertension, hyperlipidemia and sleep apnea. Referred for evaluation of dyspnea. Chief complaint is dyspnea on exertion. Denies any cough, sputum production, wheezing, fevers, chills. She feels she may have a UTI due to increasing frequency of urination and burning sensation.  She has been evaluated by Dr. Einar Gip, cardiology with an echo and nuclear stress test which were normal. Has history of rheumatoid arthritis. She was previously treated with multiple therapies since 1986 including methotrexate, Plaquenil, Arava, Remicade, prednisone, leflunomide, orencia, humira, enbrel. Most recently she was also on Rituxan from 2007-2017 which had to be stopped as she developed hypogammaglobulinemia. She is currently on chronic prednisone at 5 mg and follows with Dr. Neldon Mc, Immunology and Rheumatology at [facility]. Her rheumatoid arthritis is not felt to be active at this point.  She has history of asthma and has been prescribed Qvar and Breo but is taking it only intermittently due to the cost of the inhaler. Does not have daily symptoms. She has history of OSA but is intolerant of CPAP.  Interim History: She has no new pulmonary complaints.  She has mild dyspnea on exertion which is unchanged since last visit.  She has albuterol inhaler which she uses very rarely.  Her chief complaint is joint pain, especially in the left foot and poor mobility as a result.  She has had CT and PFTs done and is here for review of tests.  Outpatient Encounter Medications as of 01/15/2017  Medication Sig  . Albuterol Sulfate (PROAIR RESPICLICK) 628 (90 Base) MCG/ACT AEPB Inhale 2 puffs into the lungs as needed (every  four to six hours for cough or wheeze).  Marland Kitchen aspirin EC 81 MG tablet Take 81 mg by mouth as directed. On Monday and thursday  . atorvastatin (LIPITOR) 40 MG tablet Take 40 mg by mouth daily.   . beclomethasone (QVAR) 80 MCG/ACT inhaler Inhale 2 puffs into the lungs 2 (two) times daily.  . cholecalciferol (VITAMIN D) 1000 UNITS tablet Take 1,000 Units by mouth daily.  . Cyanocobalamin (B-12) 2500 MCG TABS Take 1 tablet by mouth daily.  . DULoxetine (CYMBALTA) 60 MG capsule Take 60 mg by mouth 2 (two) times daily.   Marland Kitchen ezetimibe (ZETIA) 10 MG tablet   . fluticasone furoate-vilanterol (BREO ELLIPTA) 200-25 MCG/INH AEPB Inhale 1 puff into the lungs daily.  Marland Kitchen gabapentin (NEURONTIN) 100 MG capsule Take 100 mg by mouth at bedtime.  Marland Kitchen linagliptin (TRADJENTA) 5 MG TABS tablet Take 5 mg by mouth daily.  Marland Kitchen loratadine (CLARITIN) 10 MG tablet Take 10 mg by mouth daily.  . metFORMIN (GLUCOPHAGE) 500 MG tablet Take 500 mg by mouth 2 (two) times daily with a meal.   . montelukast (SINGULAIR) 10 MG tablet TAKE ONE TABLET ONCE DAILY  . Omega-3 Fatty Acids (FISH OIL) 1200 MG CAPS Take 1 capsule by mouth daily.  . pantoprazole (PROTONIX) 40 MG tablet Take one tablet once every morning  . predniSONE (DELTASONE) 10 MG tablet Take 2 tablets (20 mg total) by mouth 2 (two) times daily with a meal.  . ranitidine (ZANTAC) 150 MG tablet TAKE TWO TABLETS EVERY EVENING   No facility-administered encounter medications on file as of 01/15/2017.  Allergies as of 01/15/2017 - Review Complete 12/24/2016  Allergen Reaction Noted  . Penicillins Hives   . Sulfonamide derivatives Nausea And Vomiting     Past Medical History:  Diagnosis Date  . Asthma   . Complication of anesthesia    Anesthesia told her years ago she got too cold during surgery  . Diabetes mellitus without complication ([place])   . Emphysema   . Fibromyalgia   . Osteoporosis   . Rheumatoid arthritis(714.0)   . Sleep apnea    no cpap    Past Surgical  History:  Procedure Laterality Date  . ABDOMINAL HYSTERECTOMY    . ANKLE FRACTURE SURGERY  2007  . BREAST SURGERY     mammoplasty and then removed  . FOOT SURGERY     numerous  . PARTIAL HYSTERECTOMY  1970  . TOTAL KNEE ARTHROPLASTY  2005 / 2006   x2    Family History  Problem Relation Age of Onset  . Hypertension Unknown   . Diabetes Unknown   . Stroke Unknown   . Diabetes Mother   . Diabetes Father   . Diabetes Sister   . Diabetes Maternal Aunt   . Cancer Paternal Uncle   . Cancer Paternal Grandmother   . Breast cancer Paternal Grandmother     Social History   Socioeconomic History  . Marital status: Divorced    Spouse name: Not on file  . Number of children: Not on file  . Years of education: Not on file  . Highest education level: Not on file  Social Needs  . Financial resource strain: Not on file  . Food insecurity - worry: Not on file  . Food insecurity - inability: Not on file  . Transportation needs - medical: Not on file  . Transportation needs - non-medical: Not on file  Occupational History  . Not on file  Tobacco Use  . Smoking status: Former Smoker    Last attempt to quit: 03/11/1994    Years since quitting: 22.8  . Smokeless tobacco: Never Used  Substance and Sexual Activity  . Alcohol use: No  . Drug use: No  . Sexual activity: Not on file  Other Topics Concern  . Not on file  Social History Narrative  . Not on file    Review of systems: Review of Systems  Constitutional: Negative for fever and chills.  HENT: Negative.   Eyes: Negative for blurred vision.  Respiratory: as per HPI  Cardiovascular: Negative for chest pain and palpitations.  Gastrointestinal: Negative for vomiting, diarrhea, blood per rectum. Genitourinary: Negative for dysuria, urgency, frequency Musculoskeletal: Negative for myalgias, back pain and joint pain.  Skin: Negative for itching and rash.  Neurological: Negative for dizziness, tremors, focal weakness, seizures  and loss of consciousness.  Endo/Heme/Allergies: Negative for environmental allergies.  Psychiatric/Behavioral: Negative for depression, suicidal ideas and hallucinations.  All other systems reviewed and are negative.  Physical Exam: Blood pressure 110/60, pulse (!) 101, height 5\' 5"  (1.651 m), weight 182 lb 9.6 oz (82.8 kg), SpO2 95 %. Gen:      No acute distress HEENT:  EOMI, sclera anicteric Neck:     No masses; no thyromegaly Lungs:    Clear to auscultation bilaterally; normal respiratory effort CV:         Regular rate and rhythm; no murmurs Abd:      + bowel sounds; soft, non-tender; no palpable masses, no distension Ext:    No edema; adequate peripheral perfusion Skin:  Warm and dry; no rash Neuro: alert and oriented x 3 Psych: normal mood and affect  Data Reviewed: Chest x-ray 09/13/16-no acute cardiopulmonary process High res CT 01/09/17- [facility] Mild bronchiectasis and scattered benign-appearing cysts in the lungs.  Mild emphysematous changes  I have reviewed the images personally  FENO 12/24/16- 22  Spirometry 05/13/16 FVC 2.31 [74%], FEV1 1.97 (84%], F/F 85 No obstruction, restriction possible.  PFTs 01/15/17 FVC 2.90 [99%], FEV1 2.53 (115%), F/F 87, TLC 96%, DLCO 60% Moderate diffusion defect  Echo 11/19/16-normal LV cavity, mild concentric hypertrophy, grade 1 diastolic dysfunction, EF 92%. Mild MR, TR. Normal size and function of the right ventricle. PASP  25-30  Nuclear stress test 11/18/16-low risk study.  Assessment:  Mild emphysema without COPD Asthma does not appear to be very active and she not taking inhalers on a regular basis. FENO is low  The CT scan and PFTs reviewed.  There is mild emphysema without evidence of obstruction on spirometry. This may have been from her prior smoking history.  Her dyspnea has been stable and she does not require any controller medication.  She will continue on albuterol as needed.  Rheumatoid arthritis associated  ILD She has significant history of rheumatoid arthritis and has been on multiple treatments in the past.  CT scan shows mild bronchiectasis and cysts which may be secondary to her rheumatoid arthritis.  But this does not appear to be significant. She's had a full cardiac eval with normal studies. Echo does not show any evidence of pulmonary hypertension.   Plan/Recommendations: - Albuterol as needed  Marshell Garfinkel MD [facility] Pager [phone] 01/15/2017, 9:40 AM   CC: [name], MD

## 2017-01-15 NOTE — Patient Instructions (Addendum)
We will make a referral to [facility] for evaluation of your foot Please use albuterol as needed for dyspnea I have reviewed your pulmonary function test and CT scan of the chest.   Follow-up in 6 months.

## 2017-01-15 NOTE — Progress Notes (Signed)
PFT done today by Lindsay Lemons, CMA  

## 2017-01-15 NOTE — Telephone Encounter (Signed)
MB please advise if you received the ct report.  Pt has appt today.  Thanks

## 2017-01-15 NOTE — Telephone Encounter (Signed)
CT report has been received and will be given to PM at time of pt's OV for review.  Nothing further needed.

## 2017-01-17 ENCOUNTER — Ambulatory Visit
Admission: RE | Admit: 2017-01-17 | Discharge: 2017-01-17 | Disposition: A | Discharge status: Home / Self Care | Payer: Self-pay | Source: Ambulatory Visit | Attending: Pulmonary Disease | Admitting: Pulmonary Disease

## 2017-01-17 ENCOUNTER — Encounter: Payer: Self-pay | Admitting: Pulmonary Disease

## 2017-01-17 DIAGNOSIS — J849 Interstitial pulmonary disease, unspecified: Secondary | ICD-10-CM

## 2017-01-20 DIAGNOSIS — M205X2 Other deformities of toe(s) (acquired), left foot: Secondary | ICD-10-CM | POA: Diagnosis not present

## 2017-01-20 DIAGNOSIS — M79672 Pain in left foot: Secondary | ICD-10-CM | POA: Diagnosis not present

## 2017-01-20 DIAGNOSIS — M06072 Rheumatoid arthritis without rheumatoid factor, left ankle and foot: Secondary | ICD-10-CM | POA: Diagnosis not present

## 2017-01-20 DIAGNOSIS — M06071 Rheumatoid arthritis without rheumatoid factor, right ankle and foot: Secondary | ICD-10-CM | POA: Diagnosis not present

## 2017-01-20 DIAGNOSIS — M19071 Primary osteoarthritis, right ankle and foot: Secondary | ICD-10-CM | POA: Diagnosis not present

## 2017-01-31 ENCOUNTER — Other Ambulatory Visit: Payer: Self-pay | Admitting: Allergy and Immunology

## 2017-02-06 ENCOUNTER — Encounter: Payer: Self-pay | Admitting: Sports Medicine

## 2017-02-06 ENCOUNTER — Ambulatory Visit (INDEPENDENT_AMBULATORY_CARE_PROVIDER_SITE_OTHER): Payer: Medicare Other | Admitting: Sports Medicine

## 2017-02-06 DIAGNOSIS — E1142 Type 2 diabetes mellitus with diabetic polyneuropathy: Secondary | ICD-10-CM

## 2017-02-06 DIAGNOSIS — M79676 Pain in unspecified toe(s): Secondary | ICD-10-CM | POA: Diagnosis not present

## 2017-02-06 DIAGNOSIS — Q828 Other specified congenital malformations of skin: Secondary | ICD-10-CM | POA: Diagnosis not present

## 2017-02-06 DIAGNOSIS — M21962 Unspecified acquired deformity of left lower leg: Secondary | ICD-10-CM

## 2017-02-06 DIAGNOSIS — B351 Tinea unguium: Secondary | ICD-10-CM

## 2017-02-06 DIAGNOSIS — M21961 Unspecified acquired deformity of right lower leg: Secondary | ICD-10-CM

## 2017-02-06 DIAGNOSIS — M069 Rheumatoid arthritis, unspecified: Secondary | ICD-10-CM

## 2017-02-06 NOTE — Progress Notes (Signed)
Subjective: [name] is a 76 y.o. female patient with history of diabetes who returns to office today complaining of callus and long, painful nails  while ambulating in shoes; unable to trim. Patient states that the glucose was not recorded solve primary care doctor about 3 months ago.  Patient denies any new changes in medication or new problems at this time.   Patient Active Problem List   Diagnosis Date Noted  . Septic prepatellar bursitis of right knee 10/26/2014  . Prepatellar bursitis of right knee 08/28/2014  . DYSPHAGIA ORAL PHASE 06/18/2007  . ASTHMA 05/14/2007  . SLEEP APNEA 05/14/2007  . GERD 05/13/2007  . Rheumatoid arthritis ([place]) 05/13/2007  . OSTEOPOROSIS 05/13/2007  . DIARRHEA, CHRONIC 05/13/2007   Current Outpatient Medications on File Prior to Visit  Medication Sig Dispense Refill  . Albuterol Sulfate (PROAIR RESPICLICK) 161 (90 Base) MCG/ACT AEPB Inhale 2 puffs into the lungs as needed (every four to six hours for cough or wheeze). 1 each 1  . aspirin EC 81 MG tablet Take 81 mg by mouth as directed. On Monday and thursday    . atorvastatin (LIPITOR) 40 MG tablet Take 40 mg by mouth daily.     . beclomethasone (QVAR) 80 MCG/ACT inhaler Inhale 2 puffs into the lungs 2 (two) times daily. 1 Inhaler 5  . cholecalciferol (VITAMIN D) 1000 UNITS tablet Take 1,000 Units by mouth daily.    . Cyanocobalamin (B-12) 2500 MCG TABS Take 1 tablet by mouth daily.    . DULoxetine (CYMBALTA) 60 MG capsule Take 60 mg by mouth 2 (two) times daily.     Marland Kitchen ezetimibe (ZETIA) 10 MG tablet     . fluticasone furoate-vilanterol (BREO ELLIPTA) 200-25 MCG/INH AEPB Inhale 1 puff into the lungs daily. 60 each 5  . gabapentin (NEURONTIN) 100 MG capsule Take 100 mg by mouth at bedtime.    Marland Kitchen linagliptin (TRADJENTA) 5 MG TABS tablet Take 5 mg by mouth daily.    Marland Kitchen loratadine (CLARITIN) 10 MG tablet Take 10 mg by mouth daily.    . metFORMIN (GLUCOPHAGE) 500 MG tablet Take 500 mg by mouth 2 (two) times  daily with a meal.     . montelukast (SINGULAIR) 10 MG tablet TAKE ONE TABLET BY MOUTH ONCE DAILY 30 tablet 0  . Omega-3 Fatty Acids (FISH OIL) 1200 MG CAPS Take 1 capsule by mouth daily.    . pantoprazole (PROTONIX) 40 MG tablet Take one tablet once every morning 30 tablet 5  . predniSONE (DELTASONE) 10 MG tablet Take 2 tablets (20 mg total) by mouth 2 (two) times daily with a meal. 20 tablet 0  . ranitidine (ZANTAC) 150 MG tablet TAKE TWO TABLETS EVERY EVENING 180 tablet 1   No current facility-administered medications on file prior to visit.    Allergies  Allergen Reactions  . Penicillins Hives  . Sulfonamide Derivatives Nausea And Vomiting    REACTION: stomach problems    Recent Results (from the past 2160 hour(s))  Nitric oxide     Status: None   Collection Time: 12/24/16 12:18 PM  Result Value Ref Range   Nitric Oxide 22   Urinalysis     Status: Abnormal   Collection Time: 12/24/16 12:39 PM  Result Value Ref Range   Color, Urine YELLOW Yellow;Lt. Yellow   APPearance CLEAR Clear   Specific Gravity, Urine 1.025 1.000 - 1.030   pH 6.0 5.0 - 8.0   Total Protein, Urine TRACE (A) Negative   Urine Glucose  NEGATIVE Negative   Ketones, ur TRACE (A) Negative   Bilirubin Urine NEGATIVE Negative   Hgb urine dipstick NEGATIVE Negative   Urobilinogen, UA 0.2 0.0 - 1.0   Leukocytes, UA NEGATIVE Negative   Nitrite NEGATIVE Negative  Urine Culture     Status: None   Collection Time: 12/24/16 12:39 PM  Result Value Ref Range   MICRO NUMBER: [id number]    SPECIMEN QUALITY: ADEQUATE    Sample Source NOT GIVEN    STATUS: FINAL    Result:      Multiple organisms present, each less than 10,000 CFU/mL. These organisms, commonly found on external and internal genitalia, are considered to be colonizers. No further testing performed.  Pulmonary function test     Status: None   Collection Time: 01/15/17  8:55 AM  Result Value Ref Range   FVC-Pre 2.95 L   FVC-%Pred-Pre 101 %   FVC-Post 2.90  L   FVC-%Pred-Post 99 %   FVC-%Change-Post -1 %   FEV1-Pre 2.46 L   FEV1-%Pred-Pre 112 %   FEV1-Post 2.53 L   FEV1-%Pred-Post 115 %   FEV1-%Change-Post 3 %   FEV6-Pre 2.94 L   FEV6-%Pred-Pre 105 %   FEV6-Post 2.89 L   FEV6-%Pred-Post 103 %   FEV6-%Change-Post -1 %   Pre FEV1/FVC ratio 83 %   FEV1FVC-%Pred-Pre 111 %   Post FEV1/FVC ratio 87 %   FEV1FVC-%Change-Post 5 %   Pre FEV6/FVC Ratio 99 %   FEV6FVC-%Pred-Pre 104 %   Post FEV6/FVC ratio 100 %   FEV6FVC-%Pred-Post 105 %   FEV6FVC-%Change-Post 0 %   FEF 25-75 Pre 2.79 L/sec   FEF2575-%Pred-Pre 165 %   FEF 25-75 Post 3.11 L/sec   FEF2575-%Pred-Post 184 %   FEF2575-%Change-Post 11 %   RV 1.86 L   RV % pred 79 %   TLC 5.04 L   TLC % pred 96 %   DLCO unc 14.92 ml/min/mmHg   DLCO unc % pred 58 %   DLCO cor 15.41 ml/min/mmHg   DLCO cor % pred 60 %   DL/VA 3.38 ml/min/mmHg/L   DL/VA % pred 68 %    Objective: General: Patient is awake, alert, and oriented x 3 and in no acute distress.  Integument: Skin is warm, dry and supple bilateral. Nails are tender, long, thickened and  dystrophic with subungual debris, consistent with onychomycosis, 1-5 bilateral. No signs of infection. Callus plantar medial arch on left foot and medial left third toe and right fourth toe distal tuft, very minimal today. Remaining integument unremarkable.  Vasculature:  Dorsalis Pedis pulse 1/4 bilateral. Posterior Tibial pulse  1/4 bilateral.  Capillary fill time <3 sec 1-5 bilateral. No hair growth to the level of the digits. Temperature gradient within normal limits. + varicosities present bilateral. Trace edema present bilateral.   Neurology: The patient has diminished sensation measured with a 5.07/10g Semmes Weinstein Monofilament at all pedal sites bilateral . Vibratory sensation diminished bilateral with tuning fork. No Babinski sign present bilateral.   Musculoskeletal: Severely dislocated lesser metatarsophalangeal joints and digital  deformities secondary to rheumatoid arthritis, there is significant pes planus deformity with plantar bony prominences specifically at the navicular of the left foot. Muscular strength 4/5 in all lower extremity muscular groups bilateral without pain on range of motion . No tenderness with calf compression bilateral.  Assessment and Plan: Problem List Items Addressed This Visit      Musculoskeletal and Integument   Rheumatoid arthritis ([place])    Other Visit Diagnoses  Dermatophytosis of nail    -  Primary   Pain of toe, unspecified laterality       Diabetic polyneuropathy associated with type 2 diabetes mellitus (HCC)       Porokeratosis       Foot deformity, bilateral          -Examined patient. -Discussed and educated patient on diabetic foot care, especially with  regards to the vascular, neurological and musculoskeletal systems.  -Stressed the importance of good glycemic control and the detriment of not  controlling glucose levels in relation to the foot. -Mechanically Callus 2 using rotary bur and debrided all nails 1-5 bilateral using sterile nail nipper and filed with dremel without incident.  Dispense toe cushion to use as instructed. -Patient asked me about hammertoe surgery to straighten the toes of her left foot I advised patient that due to her rheumatoid arthritis and uncontrolled diabetes that she is not a good candidate at this time for any type of surgical correction. -Recommend good supportive shoes daily for foot type -Answered all patient questions -Patient to return  in 2.5 months for at risk foot care -Patient advised to call the office if any problems or questions arise in the meantime.  Landis Martins, DPM

## 2017-02-10 ENCOUNTER — Ambulatory Visit: Payer: Medicare Other | Admitting: Pulmonary Disease

## 2017-02-21 DIAGNOSIS — J449 Chronic obstructive pulmonary disease, unspecified: Secondary | ICD-10-CM | POA: Diagnosis not present

## 2017-02-21 DIAGNOSIS — Z6826 Body mass index (BMI) 26.0-26.9, adult: Secondary | ICD-10-CM | POA: Diagnosis not present

## 2017-02-21 DIAGNOSIS — J45998 Other asthma: Secondary | ICD-10-CM | POA: Diagnosis not present

## 2017-02-21 DIAGNOSIS — E119 Type 2 diabetes mellitus without complications: Secondary | ICD-10-CM | POA: Diagnosis not present

## 2017-02-21 DIAGNOSIS — J849 Interstitial pulmonary disease, unspecified: Secondary | ICD-10-CM | POA: Diagnosis not present

## 2017-02-21 DIAGNOSIS — L84 Corns and callosities: Secondary | ICD-10-CM | POA: Diagnosis not present

## 2017-02-21 DIAGNOSIS — M069 Rheumatoid arthritis, unspecified: Secondary | ICD-10-CM | POA: Diagnosis not present

## 2017-03-09 ENCOUNTER — Emergency Department (HOSPITAL_COMMUNITY): Payer: Medicare Other

## 2017-03-09 ENCOUNTER — Other Ambulatory Visit: Payer: Self-pay

## 2017-03-09 ENCOUNTER — Inpatient Hospital Stay (HOSPITAL_COMMUNITY)
Admission: EM | Admit: 2017-03-09 | Discharge: 2017-03-13 | DRG: 486 | Disposition: A | Discharge status: Skilled Nursing Facility | Payer: Medicare Other | Attending: Family Medicine | Admitting: Family Medicine

## 2017-03-09 ENCOUNTER — Emergency Department (HOSPITAL_COMMUNITY): Payer: Medicare Other | Admitting: Anesthesiology

## 2017-03-09 ENCOUNTER — Encounter (HOSPITAL_COMMUNITY): Payer: Self-pay

## 2017-03-09 ENCOUNTER — Encounter (HOSPITAL_COMMUNITY)
Admission: EM | Disposition: A | Discharge status: Skilled Nursing Facility | Payer: Self-pay | Source: Home / Self Care | Attending: Internal Medicine

## 2017-03-09 DIAGNOSIS — M810 Age-related osteoporosis without current pathological fracture: Secondary | ICD-10-CM | POA: Diagnosis not present

## 2017-03-09 DIAGNOSIS — M25461 Effusion, right knee: Secondary | ICD-10-CM | POA: Diagnosis not present

## 2017-03-09 DIAGNOSIS — Z882 Allergy status to sulfonamides status: Secondary | ICD-10-CM

## 2017-03-09 DIAGNOSIS — W19XXXA Unspecified fall, initial encounter: Secondary | ICD-10-CM | POA: Diagnosis present

## 2017-03-09 DIAGNOSIS — J45909 Unspecified asthma, uncomplicated: Secondary | ICD-10-CM | POA: Diagnosis not present

## 2017-03-09 DIAGNOSIS — E119 Type 2 diabetes mellitus without complications: Secondary | ICD-10-CM | POA: Diagnosis present

## 2017-03-09 DIAGNOSIS — K529 Noninfective gastroenteritis and colitis, unspecified: Secondary | ICD-10-CM | POA: Diagnosis not present

## 2017-03-09 DIAGNOSIS — Z7951 Long term (current) use of inhaled steroids: Secondary | ICD-10-CM

## 2017-03-09 DIAGNOSIS — S82091K Other fracture of right patella, subsequent encounter for closed fracture with nonunion: Secondary | ICD-10-CM

## 2017-03-09 DIAGNOSIS — T8453XA Infection and inflammatory reaction due to internal right knee prosthesis, initial encounter: Principal | ICD-10-CM | POA: Diagnosis present

## 2017-03-09 DIAGNOSIS — Z87891 Personal history of nicotine dependence: Secondary | ICD-10-CM | POA: Diagnosis not present

## 2017-03-09 DIAGNOSIS — G4730 Sleep apnea, unspecified: Secondary | ICD-10-CM | POA: Diagnosis present

## 2017-03-09 DIAGNOSIS — M7041 Prepatellar bursitis, right knee: Secondary | ICD-10-CM | POA: Diagnosis not present

## 2017-03-09 DIAGNOSIS — Z79899 Other long term (current) drug therapy: Secondary | ICD-10-CM

## 2017-03-09 DIAGNOSIS — M797 Fibromyalgia: Secondary | ICD-10-CM | POA: Diagnosis not present

## 2017-03-09 DIAGNOSIS — Z96653 Presence of artificial knee joint, bilateral: Secondary | ICD-10-CM | POA: Diagnosis present

## 2017-03-09 DIAGNOSIS — M06261 Rheumatoid bursitis, right knee: Secondary | ICD-10-CM | POA: Diagnosis not present

## 2017-03-09 DIAGNOSIS — M71161 Other infective bursitis, right knee: Secondary | ICD-10-CM | POA: Diagnosis present

## 2017-03-09 DIAGNOSIS — Z833 Family history of diabetes mellitus: Secondary | ICD-10-CM | POA: Diagnosis not present

## 2017-03-09 DIAGNOSIS — Z7984 Long term (current) use of oral hypoglycemic drugs: Secondary | ICD-10-CM | POA: Diagnosis not present

## 2017-03-09 DIAGNOSIS — Z7982 Long term (current) use of aspirin: Secondary | ICD-10-CM

## 2017-03-09 DIAGNOSIS — M069 Rheumatoid arthritis, unspecified: Secondary | ICD-10-CM

## 2017-03-09 DIAGNOSIS — Y831 Surgical operation with implant of artificial internal device as the cause of abnormal reaction of the patient, or of later complication, without mention of misadventure at the time of the procedure: Secondary | ICD-10-CM | POA: Diagnosis present

## 2017-03-09 DIAGNOSIS — M25561 Pain in right knee: Secondary | ICD-10-CM | POA: Diagnosis not present

## 2017-03-09 DIAGNOSIS — Z90710 Acquired absence of both cervix and uterus: Secondary | ICD-10-CM

## 2017-03-09 DIAGNOSIS — M7051 Other bursitis of knee, right knee: Secondary | ICD-10-CM | POA: Diagnosis not present

## 2017-03-09 DIAGNOSIS — R197 Diarrhea, unspecified: Secondary | ICD-10-CM | POA: Diagnosis present

## 2017-03-09 DIAGNOSIS — M009 Pyogenic arthritis, unspecified: Secondary | ICD-10-CM | POA: Diagnosis not present

## 2017-03-09 DIAGNOSIS — T84032A Mechanical loosening of internal right knee prosthetic joint, initial encounter: Secondary | ICD-10-CM | POA: Diagnosis not present

## 2017-03-09 DIAGNOSIS — Z880 Allergy status to penicillin: Secondary | ICD-10-CM | POA: Diagnosis not present

## 2017-03-09 DIAGNOSIS — Z7952 Long term (current) use of systemic steroids: Secondary | ICD-10-CM

## 2017-03-09 DIAGNOSIS — K219 Gastro-esophageal reflux disease without esophagitis: Secondary | ICD-10-CM | POA: Diagnosis not present

## 2017-03-09 DIAGNOSIS — R229 Localized swelling, mass and lump, unspecified: Secondary | ICD-10-CM | POA: Diagnosis not present

## 2017-03-09 DIAGNOSIS — S2190XA Unspecified open wound of unspecified part of thorax, initial encounter: Secondary | ICD-10-CM | POA: Diagnosis not present

## 2017-03-09 DIAGNOSIS — T148XXA Other injury of unspecified body region, initial encounter: Secondary | ICD-10-CM | POA: Diagnosis not present

## 2017-03-09 HISTORY — PX: IRRIGATION AND DEBRIDEMENT KNEE: SHX5185

## 2017-03-09 LAB — CBC WITH DIFFERENTIAL/PLATELET
BASOS ABS: 0 10*3/uL (ref 0.0–0.1)
Basophils Relative: 0 %
EOS PCT: 0 %
Eosinophils Absolute: 0.1 10*3/uL (ref 0.0–0.7)
HCT: 38.6 % (ref 36.0–46.0)
Hemoglobin: 12.1 g/dL (ref 12.0–15.0)
LYMPHS PCT: 8 %
Lymphs Abs: 1.1 10*3/uL (ref 0.7–4.0)
MCH: 26.5 pg (ref 26.0–34.0)
MCHC: 31.3 g/dL (ref 30.0–36.0)
MCV: 84.6 fL (ref 78.0–100.0)
Monocytes Absolute: 1.5 10*3/uL — ABNORMAL HIGH (ref 0.1–1.0)
Monocytes Relative: 10 %
NEUTROS ABS: 12.3 10*3/uL — AB (ref 1.7–7.7)
Neutrophils Relative %: 82 %
PLATELETS: 272 10*3/uL (ref 150–400)
RBC: 4.56 MIL/uL (ref 3.87–5.11)
RDW: 16.2 % — ABNORMAL HIGH (ref 11.5–15.5)
WBC: 15 10*3/uL — AB (ref 4.0–10.5)

## 2017-03-09 LAB — BASIC METABOLIC PANEL
ANION GAP: 9 (ref 5–15)
BUN: 20 mg/dL (ref 6–20)
CALCIUM: 8.6 mg/dL — AB (ref 8.9–10.3)
CO2: 21 mmol/L — ABNORMAL LOW (ref 22–32)
Chloride: 105 mmol/L (ref 101–111)
Creatinine, Ser: 1.1 mg/dL — ABNORMAL HIGH (ref 0.44–1.00)
GFR, EST AFRICAN AMERICAN: 55 mL/min — AB (ref >60)
GFR, EST NON AFRICAN AMERICAN: 48 mL/min — AB (ref >60)
GLUCOSE: 110 mg/dL — AB (ref 65–99)
POTASSIUM: 4 mmol/L (ref 3.5–5.1)
Sodium: 135 mmol/L (ref 135–145)

## 2017-03-09 LAB — I-STAT CG4 LACTIC ACID, ED: LACTIC ACID, VENOUS: 1.18 mmol/L (ref 0.5–1.9)

## 2017-03-09 LAB — GLUCOSE, CAPILLARY
Glucose-Capillary: 144 mg/dL — ABNORMAL HIGH (ref 65–99)
Glucose-Capillary: 199 mg/dL — ABNORMAL HIGH (ref 65–99)

## 2017-03-09 LAB — SEDIMENTATION RATE: SED RATE: 50 mm/h — AB (ref 0–22)

## 2017-03-09 LAB — C-REACTIVE PROTEIN: CRP: 19.4 mg/dL — AB (ref <1.0)

## 2017-03-09 SURGERY — IRRIGATION AND DEBRIDEMENT KNEE
Anesthesia: General | Laterality: Right

## 2017-03-09 MED ORDER — ATORVASTATIN CALCIUM 40 MG PO TABS
40.0000 mg | ORAL_TABLET | Freq: Every day | ORAL | Status: DC
  Administered 2017-03-10 – 2017-03-12 (×4): 40 mg via ORAL
  Filled 2017-03-09 (×4): qty 1

## 2017-03-09 MED ORDER — LACTATED RINGERS IV SOLN
INTRAVENOUS | Status: DC | PRN
Start: 2017-03-09 — End: 2017-03-09
  Administered 2017-03-09: 20:00:00 via INTRAVENOUS

## 2017-03-09 MED ORDER — VANCOMYCIN HCL IN DEXTROSE 750-5 MG/150ML-% IV SOLN
750.0000 mg | Freq: Two times a day (BID) | INTRAVENOUS | Status: DC
  Administered 2017-03-10 – 2017-03-13 (×7): 750 mg via INTRAVENOUS
  Filled 2017-03-09 (×10): qty 150

## 2017-03-09 MED ORDER — PROPOFOL 10 MG/ML IV BOLUS
INTRAVENOUS | Status: AC
  Filled 2017-03-09: qty 20

## 2017-03-09 MED ORDER — HYDROMORPHONE HCL 1 MG/ML IJ SOLN
0.5000 mg | Freq: Once | INTRAMUSCULAR | Status: AC
  Administered 2017-03-09: 0.5 mg via INTRAVENOUS
  Filled 2017-03-09: qty 1

## 2017-03-09 MED ORDER — ONDANSETRON HCL 4 MG/2ML IJ SOLN
4.0000 mg | Freq: Once | INTRAMUSCULAR | Status: AC
  Administered 2017-03-09: 4 mg via INTRAVENOUS
  Filled 2017-03-09: qty 2

## 2017-03-09 MED ORDER — INSULIN ASPART 100 UNIT/ML ~~LOC~~ SOLN
0.0000 [IU] | Freq: Three times a day (TID) | SUBCUTANEOUS | Status: DC
  Administered 2017-03-10: 2 [IU] via SUBCUTANEOUS
  Administered 2017-03-10: 5 [IU] via SUBCUTANEOUS
  Administered 2017-03-10 – 2017-03-11 (×2): 2 [IU] via SUBCUTANEOUS
  Administered 2017-03-11 – 2017-03-13 (×3): 1 [IU] via SUBCUTANEOUS

## 2017-03-09 MED ORDER — FENTANYL CITRATE (PF) 250 MCG/5ML IJ SOLN
INTRAMUSCULAR | Status: AC
  Filled 2017-03-09: qty 5

## 2017-03-09 MED ORDER — BUDESONIDE 0.25 MG/2ML IN SUSP: 0.2500 mg | Freq: Two times a day (BID) | RESPIRATORY_TRACT | Status: DC | PRN

## 2017-03-09 MED ORDER — VANCOMYCIN HCL IN DEXTROSE 1-5 GM/200ML-% IV SOLN
INTRAVENOUS | Status: AC
  Filled 2017-03-09: qty 200

## 2017-03-09 MED ORDER — ONDANSETRON HCL 4 MG/2ML IJ SOLN
INTRAMUSCULAR | Status: DC | PRN
  Administered 2017-03-09: 4 mg via INTRAVENOUS

## 2017-03-09 MED ORDER — ACETAMINOPHEN 10 MG/ML IV SOLN
INTRAVENOUS | Status: AC
  Filled 2017-03-09: qty 100

## 2017-03-09 MED ORDER — BECLOMETHASONE DIPROPIONATE 80 MCG/ACT IN AERS: 2.0000 | INHALATION_SPRAY | Freq: Two times a day (BID) | RESPIRATORY_TRACT | Status: DC | PRN

## 2017-03-09 MED ORDER — MORPHINE SULFATE 15 MG PO TABS: 30.0000 mg | ORAL_TABLET | Freq: Every day | ORAL | Status: DC | PRN

## 2017-03-09 MED ORDER — ACETAMINOPHEN 10 MG/ML IV SOLN
INTRAVENOUS | Status: DC | PRN
  Administered 2017-03-09: 1000 mg via INTRAVENOUS

## 2017-03-09 MED ORDER — FENTANYL CITRATE (PF) 100 MCG/2ML IJ SOLN
INTRAMUSCULAR | Status: DC | PRN
  Administered 2017-03-09: 150 ug via INTRAVENOUS

## 2017-03-09 MED ORDER — EZETIMIBE 10 MG PO TABS
10.0000 mg | ORAL_TABLET | Freq: Every day | ORAL | Status: DC
  Administered 2017-03-10 – 2017-03-12 (×3): 10 mg via ORAL
  Filled 2017-03-09 (×4): qty 1

## 2017-03-09 MED ORDER — PROPOFOL 10 MG/ML IV BOLUS
INTRAVENOUS | Status: DC | PRN
  Administered 2017-03-09: 200 mg via INTRAVENOUS

## 2017-03-09 MED ORDER — PHENYLEPHRINE 40 MCG/ML (10ML) SYRINGE FOR IV PUSH (FOR BLOOD PRESSURE SUPPORT)
PREFILLED_SYRINGE | INTRAVENOUS | Status: DC | PRN
  Administered 2017-03-09: 80 ug via INTRAVENOUS
  Administered 2017-03-09 (×2): 120 ug via INTRAVENOUS
  Administered 2017-03-09 (×4): 80 ug via INTRAVENOUS

## 2017-03-09 MED ORDER — SODIUM CHLORIDE 0.9% FLUSH
3.0000 mL | Freq: Two times a day (BID) | INTRAVENOUS | Status: DC
  Administered 2017-03-11 – 2017-03-12 (×2): 3 mL via INTRAVENOUS

## 2017-03-09 MED ORDER — DULOXETINE HCL 60 MG PO CPEP
120.0000 mg | ORAL_CAPSULE | Freq: Every day | ORAL | Status: DC
  Administered 2017-03-10 – 2017-03-12 (×4): 120 mg via ORAL
  Filled 2017-03-09 (×5): qty 2

## 2017-03-09 MED ORDER — LIDOCAINE HCL 2 % IJ SOLN
30.0000 mL | Freq: Once | INTRAMUSCULAR | Status: AC
  Administered 2017-03-09: 600 mg
  Filled 2017-03-09: qty 20

## 2017-03-09 MED ORDER — LIDOCAINE 2% (20 MG/ML) 5 ML SYRINGE
INTRAMUSCULAR | Status: DC | PRN
Start: 2017-03-09 — End: 2017-03-09
  Administered 2017-03-09: 100 mg via INTRAVENOUS

## 2017-03-09 MED ORDER — KETOROLAC TROMETHAMINE 30 MG/ML IJ SOLN: 15.0000 mg | Freq: Once | INTRAMUSCULAR | Status: DC | PRN

## 2017-03-09 MED ORDER — PANTOPRAZOLE SODIUM 40 MG PO TBEC
40.0000 mg | DELAYED_RELEASE_TABLET | Freq: Every day | ORAL | Status: DC
  Administered 2017-03-10 – 2017-03-13 (×4): 40 mg via ORAL
  Filled 2017-03-09 (×5): qty 1

## 2017-03-09 MED ORDER — LORATADINE 10 MG PO TABS
10.0000 mg | ORAL_TABLET | Freq: Every day | ORAL | Status: DC
  Administered 2017-03-10 – 2017-03-12 (×4): 10 mg via ORAL
  Filled 2017-03-09 (×4): qty 1

## 2017-03-09 MED ORDER — DEXAMETHASONE SODIUM PHOSPHATE 10 MG/ML IJ SOLN
INTRAMUSCULAR | Status: DC | PRN
  Administered 2017-03-09: 10 mg via INTRAVENOUS

## 2017-03-09 MED ORDER — ONDANSETRON HCL 4 MG PO TABS: 4.0000 mg | ORAL_TABLET | Freq: Four times a day (QID) | ORAL | Status: DC | PRN

## 2017-03-09 MED ORDER — SODIUM CHLORIDE 0.9 % IR SOLN
Status: DC | PRN
  Administered 2017-03-09 (×2): 3000 mL

## 2017-03-09 MED ORDER — ALBUTEROL SULFATE (2.5 MG/3ML) 0.083% IN NEBU: 3.0000 mL | INHALATION_SOLUTION | RESPIRATORY_TRACT | Status: DC | PRN

## 2017-03-09 MED ORDER — FENTANYL CITRATE (PF) 100 MCG/2ML IJ SOLN
25.0000 ug | INTRAMUSCULAR | Status: DC | PRN
  Administered 2017-03-09: 25 ug via INTRAVENOUS
  Administered 2017-03-09: 50 ug via INTRAVENOUS
  Administered 2017-03-09: 25 ug via INTRAVENOUS

## 2017-03-09 MED ORDER — FLUTICASONE FUROATE-VILANTEROL 200-25 MCG/INH IN AEPB
1.0000 | INHALATION_SPRAY | Freq: Every day | RESPIRATORY_TRACT | Status: DC | PRN
  Filled 2017-03-09: qty 28

## 2017-03-09 MED ORDER — SODIUM CHLORIDE 0.9 % IV SOLN
1500.0000 mg | Freq: Once | INTRAVENOUS | Status: AC
  Administered 2017-03-09: 1500 mg via INTRAVENOUS
  Filled 2017-03-09: qty 1500

## 2017-03-09 MED ORDER — SODIUM CHLORIDE 0.9% FLUSH: 3.0000 mL | INTRAVENOUS | Status: DC | PRN

## 2017-03-09 MED ORDER — EPHEDRINE SULFATE 50 MG/ML IJ SOLN
INTRAMUSCULAR | Status: DC | PRN
  Administered 2017-03-09 (×3): 10 mg via INTRAVENOUS

## 2017-03-09 MED ORDER — FENTANYL CITRATE (PF) 100 MCG/2ML IJ SOLN
INTRAMUSCULAR | Status: AC
  Filled 2017-03-09: qty 2

## 2017-03-09 MED ORDER — PREDNISONE 5 MG PO TABS
5.0000 mg | ORAL_TABLET | Freq: Every day | ORAL | Status: DC
  Administered 2017-03-10 – 2017-03-12 (×4): 5 mg via ORAL
  Filled 2017-03-09 (×4): qty 1

## 2017-03-09 MED ORDER — ONDANSETRON HCL 4 MG/2ML IJ SOLN
4.0000 mg | Freq: Four times a day (QID) | INTRAMUSCULAR | Status: DC | PRN
  Administered 2017-03-11: 4 mg via INTRAVENOUS

## 2017-03-09 MED ORDER — GABAPENTIN 100 MG PO CAPS
100.0000 mg | ORAL_CAPSULE | Freq: Every day | ORAL | Status: DC
  Administered 2017-03-10 – 2017-03-12 (×4): 100 mg via ORAL
  Filled 2017-03-09 (×4): qty 1

## 2017-03-09 MED ORDER — VANCOMYCIN HCL 500 MG IV SOLR
500.0000 mg | INTRAVENOUS | Status: DC
  Filled 2017-03-09: qty 500

## 2017-03-09 MED ORDER — PROMETHAZINE HCL 25 MG/ML IJ SOLN: 6.2500 mg | INTRAMUSCULAR | Status: DC | PRN

## 2017-03-09 MED ORDER — SODIUM CHLORIDE 0.9 % IV SOLN
250.0000 mL | INTRAVENOUS | Status: DC | PRN
  Administered 2017-03-10: 250 mL via INTRAVENOUS

## 2017-03-09 SURGICAL SUPPLY — 54 items
BANDAGE ACE 3X5.8 VEL STRL LF (GAUZE/BANDAGES/DRESSINGS) ×2 IMPLANT
BANDAGE ACE 4X5 VEL STRL LF (GAUZE/BANDAGES/DRESSINGS) ×2 IMPLANT
BANDAGE ACE 6X5 VEL STRL LF (GAUZE/BANDAGES/DRESSINGS) ×1 IMPLANT
BANDAGE ELASTIC 6 VELCRO ST LF (GAUZE/BANDAGES/DRESSINGS) ×1 IMPLANT
BNDG COHESIVE 4X5 TAN STRL (GAUZE/BANDAGES/DRESSINGS) ×2 IMPLANT
BNDG GAUZE ELAST 4 BULKY (GAUZE/BANDAGES/DRESSINGS) ×2 IMPLANT
COVER SURGICAL LIGHT HANDLE (MISCELLANEOUS) ×1 IMPLANT
CUFF TOURNIQUET SINGLE 18IN (TOURNIQUET CUFF) ×1 IMPLANT
CUFF TOURNIQUET SINGLE 24IN (TOURNIQUET CUFF) IMPLANT
CUFF TOURNIQUET SINGLE 34IN LL (TOURNIQUET CUFF) IMPLANT
CUFF TOURNIQUET SINGLE 44IN (TOURNIQUET CUFF) IMPLANT
DRAPE IMP U-DRAPE 54X76 (DRAPES) ×2 IMPLANT
DRSG ADAPTIC 3X8 NADH LF (GAUZE/BANDAGES/DRESSINGS) ×1 IMPLANT
DRSG PAD ABDOMINAL 8X10 ST (GAUZE/BANDAGES/DRESSINGS) ×3 IMPLANT
DURAPREP 26ML APPLICATOR (WOUND CARE) ×1 IMPLANT
ELECT REM PT RETURN 9FT ADLT (ELECTROSURGICAL)
ELECTRODE REM PT RTRN 9FT ADLT (ELECTROSURGICAL) IMPLANT
EVACUATOR 1/8 PVC DRAIN (DRAIN) ×2 IMPLANT
FACESHIELD WRAPAROUND (MASK) IMPLANT
FACESHIELD WRAPAROUND OR TEAM (MASK) ×1 IMPLANT
GAUZE SPONGE 4X4 12PLY STRL (GAUZE/BANDAGES/DRESSINGS) ×2 IMPLANT
GLOVE BIOGEL PI IND STRL 7.0 (GLOVE) IMPLANT
GLOVE BIOGEL PI INDICATOR 7.0 (GLOVE) ×1
GLOVE SURG SS PI 7.0 STRL IVOR (GLOVE) ×1 IMPLANT
GLOVE SURG SS PI 8.0 STRL IVOR (GLOVE) ×4 IMPLANT
GOWN EXTRA PROTECTION XL (GOWNS) ×2 IMPLANT
GOWN STRL REUS W/ TWL LRG LVL3 (GOWN DISPOSABLE) ×3 IMPLANT
GOWN STRL REUS W/TWL LRG LVL3 (GOWN DISPOSABLE) ×2
HANDPIECE INTERPULSE COAX TIP (DISPOSABLE) ×2
IMMOBILIZER KNEE 22 UNIV (SOFTGOODS) ×1 IMPLANT
KIT BASIN OR (CUSTOM PROCEDURE TRAY) ×2 IMPLANT
KIT ROOM TURNOVER OR (KITS) ×2 IMPLANT
MANIFOLD NEPTUNE II (INSTRUMENTS) ×2 IMPLANT
NS IRRIG 1000ML POUR BTL (IV SOLUTION) ×2 IMPLANT
PACK ORTHO EXTREMITY (CUSTOM PROCEDURE TRAY) ×2 IMPLANT
PACK UNIVERSAL I (CUSTOM PROCEDURE TRAY) ×2 IMPLANT
PAD ARMBOARD 7.5X6 YLW CONV (MISCELLANEOUS) ×4 IMPLANT
PADDING CAST COTTON 6X4 STRL (CAST SUPPLIES) ×1 IMPLANT
SET HNDPC FAN SPRY TIP SCT (DISPOSABLE) IMPLANT
SPONGE LAP 18X18 X RAY DECT (DISPOSABLE) ×2 IMPLANT
SPONGE LAP 4X18 X RAY DECT (DISPOSABLE) ×2 IMPLANT
STAPLER VISISTAT 35W (STAPLE) ×1 IMPLANT
STOCKINETTE IMPERVIOUS 9X36 MD (GAUZE/BANDAGES/DRESSINGS) ×2 IMPLANT
SUT ETHILON 2 0 FS 18 (SUTURE) ×2 IMPLANT
SUT ETHILON 4 0 PS 2 18 (SUTURE) ×1 IMPLANT
SUT VIC AB 2-0 SH 27 (SUTURE) ×4
SUT VIC AB 2-0 SH 27XBRD (SUTURE) IMPLANT
SWAB CULTURE ESWAB REG 1ML (MISCELLANEOUS) ×2 IMPLANT
TOWEL OR 17X24 6PK STRL BLUE (TOWEL DISPOSABLE) ×2 IMPLANT
TOWEL OR 17X26 10 PK STRL BLUE (TOWEL DISPOSABLE) ×2 IMPLANT
TUBE CONNECTING 12X1/4 (SUCTIONS) ×2 IMPLANT
UNDERPAD 30X30 (UNDERPADS AND DIAPERS) ×2 IMPLANT
WATER STERILE IRR 1000ML POUR (IV SOLUTION) ×1 IMPLANT
YANKAUER SUCT BULB TIP NO VENT (SUCTIONS) ×2 IMPLANT

## 2017-03-09 NOTE — Anesthesia Procedure Notes (Signed)
Procedure Name: LMA Insertion Date/Time: 03/09/2017 8:08 PM Performed by: Imagene Riches, CRNA Pre-anesthesia Checklist: Patient identified, Emergency Drugs available, Suction available and Patient being monitored Patient Re-evaluated:Patient Re-evaluated prior to induction Oxygen Delivery Method: Circle System Utilized Preoxygenation: Pre-oxygenation with 100% oxygen Induction Type: IV induction Ventilation: Mask ventilation without difficulty LMA: LMA inserted LMA Size: 4.0 Number of attempts: 1 Airway Equipment and Method: Bite block Placement Confirmation: positive ETCO2 Tube secured with: Tape Dental Injury: Teeth and Oropharynx as per pre-operative assessment

## 2017-03-09 NOTE — ED Provider Notes (Signed)
Medical screening examination/treatment/procedure(s) were conducted as a shared visit with non-physician practitioner(s) and myself.  I personally evaluated the patient during the encounter.  Clinical Impression:   Final diagnoses:  Septic prepatellar bursitis of right knee     Patient is a 77 year old female with a history of a right total knee arthroplasty many years ago, she has had multiple recurrent infections around the knee including a septic bursitis in the past and has required multiple surgeries to wash it out clean it out and treat recurrent infection.  She denies any objective fevers but has had a progressive swelling with redness of the knee.  On exam the patient has decreased range of motion of the right knee secondary to significant and severe pain with movement.  She has a very fluctuant abscess over the patella to just over the superior area just above the patella, the entire knee appears swollen and red though it is mostly the anterior area.  Pulses intact, mild tachycardia.  Needs ortho consult and antibiotics.  Ortho Maureen Ralphs is her Psychologist, sport and exercise.  Vitals:   03/09/17 1508  BP: 119/68  Pulse: (!) 103  Temp: 99.5 F (37.5 C)  TempSrc: Oral  SpO2: 96%      Leslie Chapel, MD 03/10/17 [phone]

## 2017-03-09 NOTE — Brief Op Note (Addendum)
03/09/2017  8:51 PM  PATIENT:  [name]  76 y.o. female  PRE-OPERATIVE DIAGNOSIS:  infected right knee  POST-OPERATIVE DIAGNOSIS:  Septic right total knee arthroplasty  PROCEDURE:  Procedure(s): IRRIGATION AND DEBRIDEMENT RIGHT KNEE PRE-PATELLAR BURSA (Right)  SURGEON:  Surgeon(s) and Role:    Susa Day, MD - Primary  PHYSICIAN ASSISTANT:   ASSISTANTS: none   ANESTHESIA:   general  EBL:  minimal   BLOOD ADMINISTERED:none  DRAINS: (1) Hemovact drain(s) in the knee with  Suction Open   LOCAL MEDICATIONS USED:  NONE  SPECIMEN:  No Specimenjoint culture  DISPOSITION OF SPECIMEN:  PATHOLOGY  COUNTS:  YES  TOURNIQUET:  * No tourniquets in log *  DICTATION: .Other Dictation: Dictation Number [id number]  PLAN OF CARE: Admit to inpatient   PATIENT DISPOSITION:  PACU - hemodynamically stable.   Delay start of Pharmacological VTE agent (>24hrs) due to surgical blood loss or risk of bleeding: no

## 2017-03-09 NOTE — Transfer of Care (Signed)
Immediate Anesthesia Transfer of Care Note  Patient: [name]  Procedure(s) Performed: IRRIGATION AND DEBRIDEMENT RIGHT KNEE PRE-PATELLAR BURSA (Right )  Patient Location: PACU  Anesthesia Type:General  Level of Consciousness: awake, alert  and oriented  Airway & Oxygen Therapy: Patient Spontanous Breathing and Patient connected to nasal cannula oxygen  Post-op Assessment: Report given to RN and Post -op Vital signs reviewed and stable  Post vital signs: Reviewed and stable  Last Vitals:  Vitals:   03/09/17 2102 03/09/17 2115  BP: (!) 104/56 (!) 108/49  Pulse: 92 95  Resp: 20 18  Temp: 36.6 C   SpO2: 93% 91%    Last Pain:  Vitals:   03/09/17 2120  TempSrc:   PainSc: 4          Complications: No apparent anesthesia complications

## 2017-03-09 NOTE — ED Notes (Signed)
Patient transported to X-ray 

## 2017-03-09 NOTE — ED Notes (Signed)
Josh, PA at bedside at this time.

## 2017-03-09 NOTE — ED Provider Notes (Signed)
[facility] Provider Note   CSN: [id number] Arrival date & time: 03/09/17  1508     History   Chief Complaint Chief Complaint  Patient presents with  . Knee Pain    HPI [name] is a 76 y.o. female.  Patient with history of diabetes, previous prepatellar bursitis requiring 4 procedures in the OR in 7-10/2014, no growth on culture --presents with complaint of worsening right knee pain, warmth, swelling since a fall that she had a week ago.  Patient did not have any acute injuries during the fall but notes that the skin split open and drained a substantial amount of clear liquid mixed with blood.  Patient reports worsening pain, swelling and trouble with ambulation over the past 4-5 days.  Called her PCP and was rx doxycycline 100mg  bid on 12/27; she has taken an total of 6 doses. She has been using a walker at home but has been having increasing trouble with ambulation and states that she has been eating less because she is unable to get to her kitchen.  No nausea, vomiting, or diarrhea. No fevers. Her surgeon is Dr. Maureen Ralphs.       Past Medical History:  Diagnosis Date  . Asthma   . Complication of anesthesia    Anesthesia told her years ago she got too cold during surgery  . Diabetes mellitus without complication ([place])   . Emphysema   . Fibromyalgia   . Osteoporosis   . Rheumatoid arthritis(714.0)   . Sleep apnea    no cpap    Patient Active Problem List   Diagnosis Date Noted  . Septic prepatellar bursitis of right knee 10/26/2014  . Prepatellar bursitis of right knee 08/28/2014  . DYSPHAGIA ORAL PHASE 06/18/2007  . ASTHMA 05/14/2007  . SLEEP APNEA 05/14/2007  . GERD 05/13/2007  . Rheumatoid arthritis ([place]) 05/13/2007  . OSTEOPOROSIS 05/13/2007  . DIARRHEA, CHRONIC 05/13/2007    Past Surgical History:  Procedure Laterality Date  . ABDOMINAL HYSTERECTOMY    . ANKLE FRACTURE SURGERY  2007  . BREAST SURGERY     mammoplasty and then removed  . FOOT SURGERY     numerous  . I&D EXTREMITY Right 10/26/2014   Procedure: IRRIGATION AND DEBRIDEMENT RIGHT KNEE ;  Surgeon: Gaynelle Arabian, MD;  Location: [facility];  Service: Orthopedics;  Laterality: Right;  . KNEE BURSECTOMY Right 09/16/2014   Procedure: EXCISON OF PRE PATELLA BURSA RIGHT KNEE ;  Surgeon: Gaynelle Arabian, MD;  Location: [facility];  Service: Orthopedics;  Laterality: Right;  . PARTIAL HYSTERECTOMY  1970  . TOTAL KNEE ARTHROPLASTY  2005 / 2006   x2    OB History    No data available       Home Medications    Prior to Admission medications   Medication Sig Start Date End Date Taking? Authorizing Provider  Albuterol Sulfate (PROAIR RESPICLICK) 829 (90 Base) MCG/ACT AEPB Inhale 2 puffs into the lungs as needed (every four to six hours for cough or wheeze). 05/13/16   Kozlow, Donnamarie Poag, MD  aspirin EC 81 MG tablet Take 81 mg by mouth as directed. On Monday and thursday    [provider]  atorvastatin (LIPITOR) 40 MG tablet Take 40 mg by mouth daily.  02/24/13   [provider]  beclomethasone (QVAR) 80 MCG/ACT inhaler Inhale 2 puffs into the lungs 2 (two) times daily. 01/18/16   Kozlow, Donnamarie Poag, MD  cholecalciferol (VITAMIN D) 1000 UNITS tablet Take  1,000 Units by mouth daily.    [provider]  Cyanocobalamin (B-12) 2500 MCG TABS Take 1 tablet by mouth daily.    [provider]  DULoxetine (CYMBALTA) 60 MG capsule Take 60 mg by mouth 2 (two) times daily.     [provider]  ezetimibe (ZETIA) 10 MG tablet  11/04/16   [provider]  fluticasone furoate-vilanterol (BREO ELLIPTA) 200-25 MCG/INH AEPB Inhale 1 puff into the lungs daily. 08/30/15   Kozlow, Donnamarie Poag, MD  gabapentin (NEURONTIN) 100 MG capsule Take 100 mg by mouth at bedtime.    [provider]  linagliptin (TRADJENTA) 5 MG TABS tablet Take 5 mg by mouth daily.    [provider]  loratadine (CLARITIN) 10 MG tablet Take 10 mg by  mouth daily.    [provider]  metFORMIN (GLUCOPHAGE) 500 MG tablet Take 500 mg by mouth 2 (two) times daily with a meal.  01/04/16   [provider]  montelukast (SINGULAIR) 10 MG tablet TAKE ONE TABLET BY MOUTH ONCE DAILY 02/03/17   Kozlow, Donnamarie Poag, MD  Omega-3 Fatty Acids (FISH OIL) 1200 MG CAPS Take 1 capsule by mouth daily.    [provider]  pantoprazole (PROTONIX) 40 MG tablet Take one tablet once every morning 10/18/15   Kozlow, Donnamarie Poag, MD  predniSONE (DELTASONE) 10 MG tablet Take 2 tablets (20 mg total) by mouth 2 (two) times daily with a meal. 09/13/16   Veryl Speak, MD  ranitidine (ZANTAC) 150 MG tablet TAKE TWO TABLETS EVERY EVENING 01/23/16   Kozlow, Donnamarie Poag, MD    Family History Family History  Problem Relation Age of Onset  . Hypertension Unknown   . Diabetes Unknown   . Stroke Unknown   . Diabetes Mother   . Diabetes Father   . Diabetes Sister   . Diabetes Maternal Aunt   . Cancer Paternal Uncle   . Cancer Paternal Grandmother   . Breast cancer Paternal Grandmother     Social History Social History   Tobacco Use  . Smoking status: Former Smoker    Last attempt to quit: 03/11/1994    Years since quitting: 23.0  . Smokeless tobacco: Never Used  Substance Use Topics  . Alcohol use: No  . Drug use: No     Allergies   Penicillins and Sulfonamide derivatives   Review of Systems Review of Systems  Constitutional: Negative for fever.  HENT: Negative for rhinorrhea and sore throat.   Eyes: Negative for redness.  Respiratory: Negative for cough.   Cardiovascular: Negative for chest pain.  Gastrointestinal: Negative for abdominal pain, diarrhea, nausea and vomiting.  Genitourinary: Negative for dysuria.  Musculoskeletal: Positive for arthralgias, gait problem, joint swelling and myalgias.  Skin: Positive for color change. Negative for rash.  Neurological: Negative for headaches.     Physical Exam Updated Vital Signs BP 119/68 (BP  Location: Left Arm)   Pulse (!) 103   Temp 99.5 F (37.5 C) (Oral)   SpO2 96%   Physical Exam  Constitutional: She appears well-developed and well-nourished.  HENT:  Head: Normocephalic and atraumatic.  Eyes: Conjunctivae are normal. Pupils are equal, round, and reactive to light. Right eye exhibits no discharge. Left eye exhibits no discharge.  Neck: Normal range of motion. Neck supple.  Cardiovascular: Normal rate, regular rhythm, normal heart sounds and normal pulses. Exam reveals no decreased pulses.  Pulmonary/Chest: Effort normal and breath sounds normal.  Abdominal: Soft. There is no tenderness.  Musculoskeletal: She  exhibits edema and tenderness.       Right knee: She exhibits decreased range of motion, swelling and erythema. She exhibits no effusion and no bony tenderness. Tenderness found.  Neurological: She is alert. No sensory deficit.  Motor, sensation, and vascular distal to the injury is fully intact.   Skin: Skin is warm and dry.  Psychiatric: She has a normal mood and affect.  Nursing note and vitals reviewed.    ED Treatments / Results  Labs (all labs ordered are listed, but only abnormal results are displayed) Labs Reviewed  CBC WITH DIFFERENTIAL/PLATELET - Abnormal; Notable for the following components:      Result Value   WBC 15.0 (*)    RDW 16.2 (*)    Neutro Abs 12.3 (*)    Monocytes Absolute 1.5 (*)    All other components within normal limits  BASIC METABOLIC PANEL - Abnormal; Notable for the following components:   CO2 21 (*)    Glucose, Bld 110 (*)    Creatinine, Ser 1.10 (*)    Calcium 8.6 (*)    GFR calc non Af Amer 48 (*)    GFR calc Af Amer 55 (*)    All other components within normal limits  SEDIMENTATION RATE - Abnormal; Notable for the following components:   Sed Rate 50 (*)    All other components within normal limits  C-REACTIVE PROTEIN - Abnormal; Notable for the following components:   CRP 19.4 (*)    All other components within  normal limits  AEROBIC CULTURE (SUPERFICIAL SPECIMEN)  I-STAT CG4 LACTIC ACID, ED     Radiology Dg Knee Complete 4 Views Right  Result Date: 03/09/2017 CLINICAL DATA:  Status post fall 8 days ago with right knee pain EXAM: RIGHT KNEE - COMPLETE 4+ VIEW COMPARISON:  None. FINDINGS: No evidence of fracture, dislocation. 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 There is marked soft tissue swelling in the anterior knee. Soft tissues are unremarkable. IMPRESSION: No acute fracture. Marked soft tissue swelling of the anterior right knee. Electronically Signed   By: Abelardo Diesel M.D.   On: 03/09/2017 16:26         Procedures Procedures (including critical care time)  Medications Ordered in ED Medications  vancomycin (VANCOCIN) 1,500 mg in sodium chloride 0.9 % 500 mL IVPB (not administered)  vancomycin (VANCOCIN) IVPB 750 mg/150 ml premix (not administered)  HYDROmorphone (DILAUDID) injection 0.5 mg (0.5 mg Intravenous Given 03/09/17 1626)  ondansetron (ZOFRAN) injection 4 mg (4 mg Intravenous Given 03/09/17 1626)  lidocaine (XYLOCAINE) 2 % (with pres) injection 600 mg (600 mg Infiltration Given by Other 03/09/17 1852)     Initial Impression / Assessment and Plan / ED Course  I have reviewed the triage vital signs and the nursing notes.  Pertinent labs & imaging results that were available during my care of the patient were reviewed by me and considered in my medical decision making (see chart for details).      Patient seen and examined. Work-up initiated. Medications ordered.   Vital signs reviewed and are as follows: BP 119/68 (BP Location: Left Arm)   Pulse (!) 103   Temp 99.5 F (37.5 C) (Oral)   SpO2 96%   4:48 PM Pt rechecked and updated. Pain is a bit better. Will need to contact her orthopedist.   6:12 PM Spoke with Dr. Tonita Cong and discussed case. Requests hospitalist admit.   Dr. Shanon Brow consulted, she will see patient.   Current plan: bedside I&D by Dr.  Beane, abx,  admission.   Final Clinical Impressions(s) / ED Diagnoses   Final diagnoses:  Septic prepatellar bursitis of right knee   Admit for ortho eval/treatment.   ED Discharge Orders    None       [place] Cater, Hershal Coria 03/09/17 [place], [place], MD 03/10/17 [phone]

## 2017-03-09 NOTE — ED Triage Notes (Signed)
[facility]- pt coming from home with complaint of right knee pain. She had surgery 3 years ago and had an injury that has become infected. Area is warm to touch. Pt reports severe pain. 144/76, 88 bpm, CBG 166, 93% spo2 (hx COPD.) Pt AOX4.

## 2017-03-09 NOTE — H&P (View-Only) (Signed)
Reason for Consult: Right knee pain  Referring Physician: EDP  [name] is an 76 y.o. female.  HPI: Fell one week ago. Pain and swelling.  Past Medical History:  Diagnosis Date  . Asthma   . Complication of anesthesia    Anesthesia told her years ago she got too cold during surgery  . Diabetes mellitus without complication ([facility])   . Emphysema   . Fibromyalgia   . Osteoporosis   . Rheumatoid arthritis(714.0)   . Sleep apnea    no cpap    Past Surgical History:  Procedure Laterality Date  . ABDOMINAL HYSTERECTOMY    . ANKLE FRACTURE SURGERY  2007  . BREAST SURGERY     mammoplasty and then removed  . FOOT SURGERY     numerous  . I&D EXTREMITY Right 10/26/2014   Procedure: IRRIGATION AND DEBRIDEMENT RIGHT KNEE ;  Surgeon: Frank Aluisio, MD;  Location: [facility];  Service: Orthopedics;  Laterality: Right;  . KNEE BURSECTOMY Right 09/16/2014   Procedure: EXCISON OF PRE PATELLA BURSA RIGHT KNEE ;  Surgeon: Frank Aluisio, MD;  Location: [facility];  Service: Orthopedics;  Laterality: Right;  . PARTIAL HYSTERECTOMY  1970  . TOTAL KNEE ARTHROPLASTY  2005 / 2006   x2    Family History  Problem Relation Age of Onset  . Hypertension Unknown   . Diabetes Unknown   . Stroke Unknown   . Diabetes Mother   . Diabetes Father   . Diabetes Sister   . Diabetes Maternal Aunt   . Cancer Paternal Uncle   . Cancer Paternal Grandmother   . Breast cancer Paternal Grandmother     Social History:  reports that she quit smoking about 23 years ago. she has never used smokeless tobacco. She reports that she does not drink alcohol or use drugs.  Allergies:  Allergies  Allergen Reactions  . Penicillins Hives  . Sulfonamide Derivatives Nausea And Vomiting    REACTION: stomach problems    Medications: I have reviewed the patient's current medications.  Results for orders placed or performed during the hospital encounter of 03/09/17 (from the past 48 hour(s))  CBC with Differential/Platelet      Status: Abnormal   Collection Time: 03/09/17  3:38 PM  Result Value Ref Range   WBC 15.0 (H) 4.0 - 10.5 K/uL   RBC 4.56 3.87 - 5.11 MIL/uL   Hemoglobin 12.1 12.0 - 15.0 g/dL   HCT 38.6 36.0 - 46.0 %   MCV 84.6 78.0 - 100.0 fL   MCH 26.5 26.0 - 34.0 pg   MCHC 31.3 30.0 - 36.0 g/dL   RDW 16.2 (H) 11.5 - 15.5 %   Platelets 272 150 - 400 K/uL   Neutrophils Relative % 82 %   Neutro Abs 12.3 (H) 1.7 - 7.7 K/uL   Lymphocytes Relative 8 %   Lymphs Abs 1.1 0.7 - 4.0 K/uL   Monocytes Relative 10 %   Monocytes Absolute 1.5 (H) 0.1 - 1.0 K/uL   Eosinophils Relative 0 %   Eosinophils Absolute 0.1 0.0 - 0.7 K/uL   Basophils Relative 0 %   Basophils Absolute 0.0 0.0 - 0.1 K/uL  Basic metabolic panel     Status: Abnormal   Collection Time: 03/09/17  3:38 PM  Result Value Ref Range   Sodium 135 135 - 145 mmol/L   Potassium 4.0 3.5 - 5.1 mmol/L   Chloride 105 101 - 111 mmol/L   CO2 21 (L) 22 - 32 mmol/L     Glucose, Bld 110 (H) 65 - 99 mg/dL   BUN 20 6 - 20 mg/dL   Creatinine, Ser 1.10 (H) 0.44 - 1.00 mg/dL   Calcium 8.6 (L) 8.9 - 10.3 mg/dL   GFR calc non Af Amer 48 (L) >60 mL/min   GFR calc Af Amer 55 (L) >60 mL/min    Comment: (NOTE) The eGFR has been calculated using the CKD EPI equation. This calculation has not been validated in all clinical situations. eGFR's persistently <60 mL/min signify possible Chronic Kidney Disease.    Anion gap 9 5 - 15  Sedimentation rate     Status: Abnormal   Collection Time: 03/09/17  3:38 PM  Result Value Ref Range   Sed Rate 50 (H) 0 - 22 mm/hr  C-reactive protein     Status: Abnormal   Collection Time: 03/09/17  3:38 PM  Result Value Ref Range   CRP 19.4 (H) <1.0 mg/dL  I-Stat CG4 Lactic Acid, ED     Status: None   Collection Time: 03/09/17  3:51 PM  Result Value Ref Range   Lactic Acid, Venous 1.18 0.5 - 1.9 mmol/L    Dg Knee Complete 4 Views Right  Result Date: 03/09/2017 CLINICAL DATA:  Status post fall 8 days ago with right knee pain  EXAM: RIGHT KNEE - COMPLETE 4+ VIEW COMPARISON:  None. FINDINGS: No evidence of fracture, dislocation. 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 There is marked soft tissue swelling in the anterior knee. Soft tissues are unremarkable. IMPRESSION: No acute fracture. Marked soft tissue swelling of the anterior right knee. Electronically Signed   By: Abelardo Diesel M.D.   On: 03/09/2017 16:26    Review of Systems  Eyes: Positive for double vision.  Cardiovascular: Positive for leg swelling.  Musculoskeletal: Positive for joint pain.  All other systems reviewed and are negative.  Blood pressure 119/68, pulse (!) 103, temperature 99.5 F (37.5 C), temperature source Oral, SpO2 96 %. Physical Exam  Constitutional: She is oriented to person, place, and time. She appears well-developed.  HENT:  Head: Normocephalic.  Eyes: Pupils are equal, round, and reactive to light.  Neck: Normal range of motion.  Cardiovascular: Normal rate.  Respiratory: Effort normal.  GI: Soft.  Musculoskeletal: She exhibits edema and tenderness.  Marked prepatellar swelling right knee. No DVT NVI. No sign of septic joint  Neurological: She is alert and oriented to person, place, and time.  Skin: Skin is warm and dry.  Psychiatric: She has a normal mood and affect.  Minimal pain with motion of knee. Extensive prepatellar swelling. Eschar  Assessment/Plan: Probable prepatellar septic bursitis. Previous nonunion of patella No sign of deep intraarticular infection. Plan I and D. Discussed risks with patient. Hospitalists to see and admit.  Kemya Shed C 03/09/2017, 6:19 PM

## 2017-03-09 NOTE — Consult Note (Addendum)
Reason for Consult: Right knee pain  Referring Physician: EDP  [name] is an 76 y.o. female.  HPI: [place] one week ago. Pain and swelling.  Past Medical History:  Diagnosis Date  . Asthma   . Complication of anesthesia    Anesthesia told her years ago she got too cold during surgery  . Diabetes mellitus without complication ([place])   . Emphysema   . Fibromyalgia   . Osteoporosis   . Rheumatoid arthritis(714.0)   . Sleep apnea    no cpap    Past Surgical History:  Procedure Laterality Date  . ABDOMINAL HYSTERECTOMY    . ANKLE FRACTURE SURGERY  2007  . BREAST SURGERY     mammoplasty and then removed  . FOOT SURGERY     numerous  . I&D EXTREMITY Right 10/26/2014   Procedure: IRRIGATION AND DEBRIDEMENT RIGHT KNEE ;  Surgeon: Gaynelle Arabian, MD;  Location: [facility];  Service: Orthopedics;  Laterality: Right;  . KNEE BURSECTOMY Right 09/16/2014   Procedure: EXCISON OF PRE PATELLA BURSA RIGHT KNEE ;  Surgeon: Gaynelle Arabian, MD;  Location: [facility];  Service: Orthopedics;  Laterality: Right;  . PARTIAL HYSTERECTOMY  1970  . TOTAL KNEE ARTHROPLASTY  2005 / 2006   x2    Family History  Problem Relation Age of Onset  . Hypertension Unknown   . Diabetes Unknown   . Stroke Unknown   . Diabetes Mother   . Diabetes Father   . Diabetes Sister   . Diabetes Maternal Aunt   . Cancer Paternal Uncle   . Cancer Paternal Grandmother   . Breast cancer Paternal Grandmother     Social History:  reports that she quit smoking about 23 years ago. she has never used smokeless tobacco. She reports that she does not drink alcohol or use drugs.  Allergies:  Allergies  Allergen Reactions  . Penicillins Hives  . Sulfonamide Derivatives Nausea And Vomiting    REACTION: stomach problems    Medications: I have reviewed the patient's current medications.  Results for orders placed or performed during the hospital encounter of 03/09/17 (from the past 48 hour(s))  CBC with Differential/Platelet      Status: Abnormal   Collection Time: 03/09/17  3:38 PM  Result Value Ref Range   WBC 15.0 (H) 4.0 - 10.5 K/uL   RBC 4.56 3.87 - 5.11 MIL/uL   Hemoglobin 12.1 12.0 - 15.0 g/dL   HCT 38.6 36.0 - 46.0 %   MCV 84.6 78.0 - 100.0 fL   MCH 26.5 26.0 - 34.0 pg   MCHC 31.3 30.0 - 36.0 g/dL   RDW 16.2 (H) 11.5 - 15.5 %   Platelets 272 150 - 400 K/uL   Neutrophils Relative % 82 %   Neutro Abs 12.3 (H) 1.7 - 7.7 K/uL   Lymphocytes Relative 8 %   Lymphs Abs 1.1 0.7 - 4.0 K/uL   Monocytes Relative 10 %   Monocytes Absolute 1.5 (H) 0.1 - 1.0 K/uL   Eosinophils Relative 0 %   Eosinophils Absolute 0.1 0.0 - 0.7 K/uL   Basophils Relative 0 %   Basophils Absolute 0.0 0.0 - 0.1 K/uL  Basic metabolic panel     Status: Abnormal   Collection Time: 03/09/17  3:38 PM  Result Value Ref Range   Sodium 135 135 - 145 mmol/L   Potassium 4.0 3.5 - 5.1 mmol/L   Chloride 105 101 - 111 mmol/L   CO2 21 (L) 22 - 32 mmol/L  Glucose, Bld 110 (H) 65 - 99 mg/dL   BUN 20 6 - 20 mg/dL   Creatinine, Ser 1.10 (H) 0.44 - 1.00 mg/dL   Calcium 8.6 (L) 8.9 - 10.3 mg/dL   GFR calc non Af Amer 48 (L) >60 mL/min   GFR calc Af Amer 55 (L) >60 mL/min    Comment: (NOTE) The eGFR has been calculated using the CKD EPI equation. This calculation has not been validated in all clinical situations. eGFR's persistently <60 mL/min signify possible Chronic Kidney Disease.    Anion gap 9 5 - 15  Sedimentation rate     Status: Abnormal   Collection Time: 03/09/17  3:38 PM  Result Value Ref Range   Sed Rate 50 (H) 0 - 22 mm/hr  C-reactive protein     Status: Abnormal   Collection Time: 03/09/17  3:38 PM  Result Value Ref Range   CRP 19.4 (H) <1.0 mg/dL  I-Stat CG4 Lactic Acid, ED     Status: None   Collection Time: 03/09/17  3:51 PM  Result Value Ref Range   Lactic Acid, Venous 1.18 0.5 - 1.9 mmol/L    Dg Knee Complete 4 Views Right  Result Date: 03/09/2017 CLINICAL DATA:  Status post fall 8 days ago with right knee pain  EXAM: RIGHT KNEE - COMPLETE 4+ VIEW COMPARISON:  None. FINDINGS: No evidence of fracture, dislocation. 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 There is marked soft tissue swelling in the anterior knee. Soft tissues are unremarkable. IMPRESSION: No acute fracture. Marked soft tissue swelling of the anterior right knee. Electronically Signed   By: Abelardo Diesel M.D.   On: 03/09/2017 16:26    Review of Systems  Eyes: Positive for double vision.  Cardiovascular: Positive for leg swelling.  Musculoskeletal: Positive for joint pain.  All other systems reviewed and are negative.  Blood pressure 119/68, pulse (!) 103, temperature 99.5 F (37.5 C), temperature source Oral, SpO2 96 %. Physical Exam  Constitutional: She is oriented to person, place, and time. She appears well-developed.  HENT:  Head: Normocephalic.  Eyes: Pupils are equal, round, and reactive to light.  Neck: Normal range of motion.  Cardiovascular: Normal rate.  Respiratory: Effort normal.  GI: Soft.  Musculoskeletal: She exhibits edema and tenderness.  Marked prepatellar swelling right knee. No DVT NVI. No sign of septic joint  Neurological: She is alert and oriented to person, place, and time.  Skin: Skin is warm and dry.  Psychiatric: She has a normal mood and affect.  Minimal pain with motion of knee. Extensive prepatellar swelling. Eschar  Assessment/Plan: Probable prepatellar septic bursitis. Previous nonunion of patella No sign of deep intraarticular infection. Plan I and D. Discussed risks with patient. Hospitalists to see and admit.  Dmarius Reeder C 03/09/2017, 6:19 PM

## 2017-03-09 NOTE — H&P (Signed)
History and Physical    [name] YPP:[id number] DOB: [birth year] DOA: 03/09/2017  PCP: [name], MD  Patient coming from:  home  Chief Complaint:  Right knee swelling  HPI: [name] is a 76 y.o. female with medical history significant of septic right knee in mid 2016 (culture data reviewed and all no growth) comes in with several days of redness, swelling and warmth to right knee. Pt reports she fell a week ago and had a scratch above the knee and it has gotten infected.  She saw her doctor and was started on doxycycline 2 days ago and it still has progressively gotten more red over the knee and swollen.  There is no drainage from the area.  Ortho called and is taking to the OR now for washout.  vanc has been ordered.  Pt referred for admission for septic knee.  Review of Systems: As per HPI otherwise 10 point review of systems negative.   Past Medical History:  Diagnosis Date  . Asthma   . Complication of anesthesia    Anesthesia told her years ago she got too cold during surgery  . Diabetes mellitus without complication ([place])   . Emphysema   . Fibromyalgia   . Osteoporosis   . Rheumatoid arthritis(714.0)   . Sleep apnea    no cpap    Past Surgical History:  Procedure Laterality Date  . ABDOMINAL HYSTERECTOMY    . ANKLE FRACTURE SURGERY  2007  . BREAST SURGERY     mammoplasty and then removed  . FOOT SURGERY     numerous  . I&D EXTREMITY Right 10/26/2014   Procedure: IRRIGATION AND DEBRIDEMENT RIGHT KNEE ;  Surgeon: Gaynelle Arabian, MD;  Location: [facility];  Service: Orthopedics;  Laterality: Right;  . KNEE BURSECTOMY Right 09/16/2014   Procedure: EXCISON OF PRE PATELLA BURSA RIGHT KNEE ;  Surgeon: Gaynelle Arabian, MD;  Location: [facility];  Service: Orthopedics;  Laterality: Right;  . PARTIAL HYSTERECTOMY  1970  . 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 she has never used smokeless tobacco. She reports that she does  not drink alcohol or use drugs.  Allergies  Allergen Reactions  . Penicillins Hives  . Sulfonamide Derivatives Nausea And Vomiting    REACTION: stomach problems    Family History  Problem Relation Age of Onset  . Hypertension Unknown   . Diabetes Unknown   . Stroke Unknown   . Diabetes Mother   . Diabetes Father   . Diabetes Sister   . Diabetes Maternal Aunt   . Cancer Paternal Uncle   . Cancer Paternal Grandmother   . Breast cancer Paternal Grandmother     Prior to Admission medications   Medication Sig Start Date End Date Taking? Authorizing Provider  Albuterol Sulfate (PROAIR RESPICLICK) 458 (90 Base) MCG/ACT AEPB Inhale 2 puffs into the lungs as needed (every four to six hours for cough or wheeze). 05/13/16   Kozlow, Donnamarie Poag, MD  aspirin EC 81 MG tablet Take 81 mg by mouth as directed. On Monday and thursday    [provider]  atorvastatin (LIPITOR) 40 MG tablet Take 40 mg by mouth daily.  02/24/13   [provider]  beclomethasone (QVAR) 80 MCG/ACT inhaler Inhale 2 puffs into the lungs 2 (two) times daily. 01/18/16   Kozlow, Donnamarie Poag, MD  cholecalciferol (VITAMIN D) 1000 UNITS tablet Take 1,000 Units  by mouth daily.    [provider]  Cyanocobalamin (B-12) 2500 MCG TABS Take 1 tablet by mouth daily.    [provider]  DULoxetine (CYMBALTA) 60 MG capsule Take 60 mg by mouth 2 (two) times daily.     [provider]  ezetimibe (ZETIA) 10 MG tablet  11/04/16   [provider]  fluticasone furoate-vilanterol (BREO ELLIPTA) 200-25 MCG/INH AEPB Inhale 1 puff into the lungs daily. 08/30/15   Kozlow, Donnamarie Poag, MD  gabapentin (NEURONTIN) 100 MG capsule Take 100 mg by mouth at bedtime.    [provider]  linagliptin (TRADJENTA) 5 MG TABS tablet Take 5 mg by mouth daily.    [provider]  loratadine (CLARITIN) 10 MG tablet Take 10 mg by mouth daily.    [provider]  metFORMIN (GLUCOPHAGE) 500 MG tablet Take  500 mg by mouth 2 (two) times daily with a meal.  01/04/16   [provider]  montelukast (SINGULAIR) 10 MG tablet TAKE ONE TABLET BY MOUTH ONCE DAILY 02/03/17   Kozlow, Donnamarie Poag, MD  Omega-3 Fatty Acids (FISH OIL) 1200 MG CAPS Take 1 capsule by mouth daily.    [provider]  pantoprazole (PROTONIX) 40 MG tablet Take one tablet once every morning 10/18/15   Kozlow, Donnamarie Poag, MD  predniSONE (DELTASONE) 10 MG tablet Take 2 tablets (20 mg total) by mouth 2 (two) times daily with a meal. 09/13/16   Veryl Speak, MD  ranitidine (ZANTAC) 150 MG tablet TAKE TWO TABLETS EVERY EVENING 01/23/16   Jiles Prows, MD    Physical Exam: Vitals:   03/09/17 1508 03/09/17 1830 03/09/17 1900  BP: 119/68 106/74 109/75  Pulse: (!) 103 98 (!) 108  Resp:  16 19  Temp: 99.5 F (37.5 C)    TempSrc: Oral    SpO2: 96% 97% 95%      Constitutional: NAD, calm, comfortable Vitals:   03/09/17 1508 03/09/17 1830 03/09/17 1900  BP: 119/68 106/74 109/75  Pulse: (!) 103 98 (!) 108  Resp:  16 19  Temp: 99.5 F (37.5 C)    TempSrc: Oral    SpO2: 96% 97% 95%   Eyes: PERRL, lids and conjunctivae normal ENMT: Mucous membranes are moist. Posterior pharynx clear of any exudate or lesions.Normal dentition.  Neck: normal, supple, no masses, no thyromegaly Respiratory: clear to auscultation bilaterally, no wheezing, no crackles. Normal respiratory effort. No accessory muscle use.  Cardiovascular: Regular rate and rhythm, no murmurs / rubs / gallops. No extremity edema. 2+ pedal pulses. No carotid bruits.  Abdomen: no tenderness, no masses palpated. No hepatosplenomegaly. Bowel sounds positive.  Musculoskeletal: no clubbing / cyanosis. No joint deformity upper and lower extremities x right knee swollen with small abrasion that has surrounding erythema, warmth and swelling c/w infction. Good ROM, no contractures. Normal muscle tone.  Skin: no rashes, lesions, ulcers. No induration Neurologic: CN 2-12 grossly  intact. Sensation intact, DTR normal. Strength 5/5 in all 4.  Psychiatric: Normal judgment and insight. Alert and oriented x 3. Normal mood.    Labs on Admission: I have personally reviewed following labs and imaging studies  CBC: Recent Labs  Lab 03/09/17 1538  WBC 15.0*  NEUTROABS 12.3*  HGB 12.1  HCT 38.6  MCV 84.6  PLT 270   Basic Metabolic Panel: Recent Labs  Lab 03/09/17 1538  NA 135  K 4.0  CL 105  CO2 21*  GLUCOSE 110*  BUN 20  CREATININE 1.10*  CALCIUM 8.6*  GFR: CrCl cannot be calculated (Unknown ideal weight.). Liver Function Tests: No results for input(s): AST, ALT, ALKPHOS, BILITOT, PROT, ALBUMIN in the last 168 hours. No results for input(s): LIPASE, AMYLASE in the last 168 hours. No results for input(s): AMMONIA in the last 168 hours. Coagulation Profile: No results for input(s): INR, PROTIME in the last 168 hours. Cardiac Enzymes: No results for input(s): CKTOTAL, CKMB, CKMBINDEX, TROPONINI in the last 168 hours. BNP (last 3 results) No results for input(s): PROBNP in the last 8760 hours. HbA1C: No results for input(s): HGBA1C in the last 72 hours. CBG: No results for input(s): GLUCAP in the last 168 hours. Lipid Profile: No results for input(s): CHOL, HDL, LDLCALC, TRIG, CHOLHDL, LDLDIRECT in the last 72 hours. Thyroid Function Tests: No results for input(s): TSH, T4TOTAL, FREET4, T3FREE, THYROIDAB in the last 72 hours. Anemia Panel: No results for input(s): VITAMINB12, FOLATE, FERRITIN, TIBC, IRON, RETICCTPCT in the last 72 hours. Urine analysis:    Component Value Date/Time   COLORURINE YELLOW 12/24/2016 [place] 12/24/2016 1239   LABSPEC 1.025 12/24/2016 1239   PHURINE 6.0 12/24/2016 1239   GLUCOSEU NEGATIVE 12/24/2016 1239   HGBUR NEGATIVE 12/24/2016 1239   BILIRUBINUR NEGATIVE 12/24/2016 1239   KETONESUR TRACE (A) 12/24/2016 1239   PROTEINUR 30 (A) 09/16/2014 1331   UROBILINOGEN 0.2 12/24/2016 1239   NITRITE  NEGATIVE 12/24/2016 1239   LEUKOCYTESUR NEGATIVE 12/24/2016 1239   Sepsis Labs: !!!!!!!!!!!!!!!!!!!!!!!!!!!!!!!!!!!!!!!!!!!! @LABRCNTIP (procalcitonin:4,lacticidven:4) )No results found for this or any previous visit (from the past 240 hour(s)).   Radiological Exams on Admission: Dg Knee Complete 4 Views Right  Result Date: 03/09/2017 CLINICAL DATA:  Status post fall 8 days ago with right knee pain EXAM: RIGHT KNEE - COMPLETE 4+ VIEW COMPARISON:  None. FINDINGS: No evidence of fracture, dislocation. 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 There is marked soft tissue swelling in the anterior knee. Soft tissues are unremarkable. IMPRESSION: No acute fracture. Marked soft tissue swelling of the anterior right knee. Electronically Signed   By: Abelardo Diesel M.D.   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  Give vanc per pharm to dose.    Active Problems:   Asthma- cont home breathing tx   GERD- cont PPI   Rheumatoid arthritis ([place])- on chronic prednisone at 5mg  po daily, cont   DIARRHEA, CHRONIC- noted    DVT prophylaxis: scds Code Status:  full Family Communication:  none Disposition Plan:  Per day team Consults called:  Orthopedic surgery Admission status:  admission   Hadriel Northup A MD [facility]  If 7PM-7AM, please contact night-coverage [email or web address] Password [email or web address]  03/09/2017, 7:22 PM

## 2017-03-09 NOTE — Progress Notes (Signed)
  Pharmacy Antibiotic Note  [name] is a 76 y.o. female admitted on 03/09/2017 with wound infection.  Pharmacy has been consulted for vancomycin dosing. 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 WBC elevated at 15 and patient afebrile. SCr slightly elevated at 1.10 (Baseline < 1). Weight from November at [facility] = 81.6 kg. CrCl ~ 47 ml/min.   Plan: Vancomycin 1500 mg X 1  Then Vancomycin 750 mg every 12 hours Monitor cultures, clinical s/sx of infection F/U weights      Temp (24hrs), Avg:99.5 F (37.5 C), Min:99.5 F (37.5 C), Max:99.5 F (37.5 C)  Recent Labs  Lab 03/09/17 1538 03/09/17 1551  WBC 15.0*  --   CREATININE 1.10*  --   LATICACIDVEN  --  1.18    CrCl cannot be calculated (Unknown ideal weight.).    Allergies  Allergen Reactions  . Penicillins Hives  . Sulfonamide Derivatives Nausea And Vomiting    REACTION: stomach problems    Antimicrobials this admission: 12/30 Vanc>>>   Microbiology results: None collected   Thank you for allowing pharmacy to be a part of this patient's care.  Susa Raring, PharmD, BCPS PGY2 Infectious Diseases Pharmacy Resident Pager: [phone]  03/09/2017 6:25 PM

## 2017-03-09 NOTE — Anesthesia Preprocedure Evaluation (Addendum)
Anesthesia Evaluation  Patient identified by MRN, date of birth, ID band Patient awake    Reviewed: Allergy & Precautions, NPO status , Patient's Chart, lab work & pertinent test results  Airway Mallampati: II  TM Distance: >3 FB Neck ROM: Full    Dental no notable dental hx.    Pulmonary sleep apnea , former smoker,    Pulmonary exam normal breath sounds clear to auscultation       Cardiovascular negative cardio ROS Normal cardiovascular exam Rhythm:Regular Rate:Normal     Neuro/Psych Chronic narcotic use negative psych ROS   GI/Hepatic Neg liver ROS, GERD  Medicated,  Endo/Other  diabetes  Renal/GU negative Renal ROS  negative genitourinary   Musculoskeletal  (+) Arthritis , Rheumatoid disorders and steroids,    Abdominal   Peds negative pediatric ROS (+)  Hematology negative hematology ROS (+)   Anesthesia Other Findings   Reproductive/Obstetrics negative OB ROS                            Anesthesia Physical Anesthesia Plan  ASA: III and emergent  Anesthesia Plan: General   Post-op Pain Management:    Induction: Intravenous  PONV Risk Score and Plan: 2 and Ondansetron  Airway Management Planned: LMA and Oral ETT  Additional Equipment:   Intra-op Plan:   Post-operative Plan: Extubation in OR  Informed Consent: I have reviewed the patients History and Physical, chart, labs and discussed the procedure including the risks, benefits and alternatives for the proposed anesthesia with the patient or authorized representative who has indicated his/her understanding and acceptance.   Dental advisory given  Plan Discussed with: CRNA and Surgeon  Anesthesia Plan Comments:         Anesthesia Quick Evaluation

## 2017-03-09 NOTE — Anesthesia Postprocedure Evaluation (Signed)
Anesthesia Post Note  Patient: [name]  Procedure(s) Performed: IRRIGATION AND DEBRIDEMENT RIGHT KNEE PRE-PATELLAR BURSA (Right )     Patient location during evaluation: PACU Anesthesia Type: General Level of consciousness: awake and alert Pain management: pain level controlled Vital Signs Assessment: post-procedure vital signs reviewed and stable Respiratory status: spontaneous breathing, nonlabored ventilation, respiratory function stable and patient connected to nasal cannula oxygen Cardiovascular status: blood pressure returned to baseline and stable Postop Assessment: no apparent nausea or vomiting Anesthetic complications: no    Last Vitals:  Vitals:   03/09/17 2130 03/09/17 2145  BP: (!) 106/50 (!) 106/53  Pulse: 95 95  Resp: 14 15  Temp:  36.6 C  SpO2: 93% 94%    Last Pain:  Vitals:   03/09/17 2145  TempSrc:   PainSc: 2                  Laquentin Loudermilk S

## 2017-03-09 NOTE — ED Notes (Signed)
Dr. Maxie Better at bedside.

## 2017-03-09 NOTE — Interval H&P Note (Signed)
History and Physical Interval Note:  03/09/2017 7:39 PM  [name]  has presented today for surgery, with the diagnosis of infected right knee  The various methods of treatment have been discussed with the patient and family. After consideration of risks, benefits and other options for treatment, the patient has consented to  Procedure(s): IRRIGATION AND DEBRIDEMENT RIGHT KNEE PRE-PATELLAR BURSA (Right) as a surgical intervention .  The patient's history has been reviewed, patient examined, no change in status, stable for surgery.  I have reviewed the patient's chart and labs.  Questions were answered to the patient's satisfaction.     Jisele Price C

## 2017-03-10 ENCOUNTER — Other Ambulatory Visit: Payer: Self-pay | Admitting: Allergy and Immunology

## 2017-03-10 ENCOUNTER — Encounter (HOSPITAL_COMMUNITY): Payer: Self-pay | Admitting: Specialist

## 2017-03-10 ENCOUNTER — Other Ambulatory Visit: Payer: Self-pay

## 2017-03-10 LAB — BASIC METABOLIC PANEL
Anion gap: 11 (ref 5–15)
BUN: 25 mg/dL — AB (ref 6–20)
CALCIUM: 8.5 mg/dL — AB (ref 8.9–10.3)
CO2: 20 mmol/L — ABNORMAL LOW (ref 22–32)
CREATININE: 1.2 mg/dL — AB (ref 0.44–1.00)
Chloride: 103 mmol/L (ref 101–111)
GFR calc Af Amer: 50 mL/min — ABNORMAL LOW (ref >60)
GFR, EST NON AFRICAN AMERICAN: 43 mL/min — AB (ref >60)
GLUCOSE: 276 mg/dL — AB (ref 65–99)
Potassium: 3.9 mmol/L (ref 3.5–5.1)
SODIUM: 134 mmol/L — AB (ref 135–145)

## 2017-03-10 LAB — CBC
HCT: 37.2 % (ref 36.0–46.0)
Hemoglobin: 11.5 g/dL — ABNORMAL LOW (ref 12.0–15.0)
MCH: 26.3 pg (ref 26.0–34.0)
MCHC: 30.9 g/dL (ref 30.0–36.0)
MCV: 84.9 fL (ref 78.0–100.0)
PLATELETS: 268 10*3/uL (ref 150–400)
RBC: 4.38 MIL/uL (ref 3.87–5.11)
RDW: 16.2 % — AB (ref 11.5–15.5)
WBC: 10.7 10*3/uL — AB (ref 4.0–10.5)

## 2017-03-10 LAB — GLUCOSE, CAPILLARY
GLUCOSE-CAPILLARY: 252 mg/dL — AB (ref 65–99)
GLUCOSE-CAPILLARY: 161 mg/dL — AB (ref 65–99)
GLUCOSE-CAPILLARY: 181 mg/dL — AB (ref 65–99)
Glucose-Capillary: 162 mg/dL — ABNORMAL HIGH (ref 65–99)

## 2017-03-10 MED ORDER — OXYCODONE HCL 5 MG PO TABS
10.0000 mg | ORAL_TABLET | Freq: Once | ORAL | Status: AC
  Administered 2017-03-10: 10 mg via ORAL
  Filled 2017-03-10: qty 2

## 2017-03-10 MED ORDER — OXYCODONE HCL 5 MG PO TABS
10.0000 mg | ORAL_TABLET | ORAL | Status: DC | PRN
  Administered 2017-03-10 – 2017-03-13 (×7): 10 mg via ORAL
  Filled 2017-03-10 (×8): qty 2

## 2017-03-10 MED ORDER — HEPARIN SODIUM (PORCINE) 5000 UNIT/ML IJ SOLN
5000.0000 [IU] | Freq: Three times a day (TID) | INTRAMUSCULAR | Status: DC
  Administered 2017-03-10 – 2017-03-13 (×8): 5000 [IU] via SUBCUTANEOUS
  Filled 2017-03-10 (×8): qty 1

## 2017-03-10 NOTE — Op Note (Signed)
NAME:  [name], [name]                        ACCOUNT NO.:  MEDICAL RECORD NO.:  [id number]  LOCATION:                                 FACILITY:  PHYSICIAN:  Susa Day, M.D.         DATE OF BIRTH:  DATE OF PROCEDURE:  03/09/2017 DATE OF DISCHARGE:                              OPERATIVE REPORT   PREOPERATIVE DIAGNOSIS:  Prepatellar septic bursitis of the right knee.  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  ANESTHESIA:  General.  PROCEDURE PERFORMED:  Open arthrotomy of the right knee, excision of patellar bursa and lavage, and synovectomy of the right knee.  I examined the components.  They appeared to be not loose and fairly adherent.  Even the retained patellar component, which half of it was exposed based upon the avulsed proximal fragment.  ANESTHESIA:  General.  ASSISTANT:  None.  HISTORY:  This is a 76 year old female, who fell a couple of weeks or so ago, had pain and swelling within the knee and abrasion, fair amount of swelling, was seen by her medical physician, placed on doxycycline.  Few days, she developed increasing pain, swelling and redness.  She came to the emergency room, had an elevated white count, low-grade temperature. X-rays indicated old fracture of the patella, large prepatellar effusion.  She in the exam room had minimal pain with range of motion of the knee, erythema, but point tender over the prepatellar bursa was assumed.  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  She was indicated for open arthrotomy, irrigation, debridement.  Risks and benefits discussed including bleeding, infection, damage to the neurovascular structures, no change in symptoms, need for repeat debridement, revision, etc.  TECHNIQUE:  With the patient in supine position after induction of adequate general anesthesia, right  lower extremity was prepped and draped in usual sterile fashion.  A midline incision was just made over the prepatellar bursa.  After a 2-cm incision, I had an immediate evacuation of a cloudy synovial fluid with some purulence noted with that.  I therefore extended the arthrotomy cephalad and caudad approximately 6 cm, and it was evident that this was open to the deep joint.  The patient has an old fracture of the patella with a migrated proximal fragment and the distal fragment that the patellar component was still attached to.  She had a hypertrophic fat pad.  She had a scarred quadriceps tendon mechanism, cephalad.  Large aperture down into the wound.  I cultured the wound; aerobic, anaerobic cultures, and stat Gram stain and then evacuated the joint.  I flexed and extended the knee and with pulsatile lavage, copiously lavaged the joint, performed synovectomy.  With 6 liters of pulsatile lavage down the gutters, suprapatellar pouch and down into the joint beneath the patella, had extended the incision down over the patellar ligament, which appeared to be intact.  We used cautery for that.  After copious irrigation of 6 liters of pulsatile lavage and flexion and extension of the knee, the  knee appeared to be cleaned.  I therefore placed a Hemovac drain, brought out through a lateral stab wound in the skin.  I closed the subcu loosely with 2-0 Vicryl and the skin back with staples.  The quadriceps mechanism was incompetent, and based upon the separation of the patella.  We closed the wound in its entirety, and placed an Adaptic and 4x4s over that, compression dressing, sterile dressing applied. Placed in an immobilizer, then woken without difficulty and transported to the recovery room in satisfactory condition.  The patient tolerated the procedure well.  No complication.  No assistant.  Blood loss was minimal.     Susa Day, M.D.     Geralynn Rile  D:  03/09/2017  T:   03/09/2017  Job:  [id number]

## 2017-03-10 NOTE — Progress Notes (Signed)
PROGRESS NOTE    AMYLIA [name]   ZOX:[id number]  DOB: 08/19/1940  DOA: 03/09/2017 PCP: [name], MD   Brief Narrative:   [name] is a 76 y.o. female with medical history significant of septic right knee in mid 2016 (culture data reviewed and all no growth) comes in with several days of redness, swelling and warmth to right knee beginning after a fall and scratch. She failed Doxy as outpt.    Subjective: currently no complaints.  ROS: no complaints of nausea, vomiting, constipation diarrhea, cough, dyspnea or dysuria. No other complaints.   Assessment & Plan:   Principal Problem:   Arthritis, septic, knee  - ortho to manage- s/p I and D  Active Problems:   Asthma - stable - cont Nebs    GERD - PPI    Rheumatoid arthritis - chronic prednisone   DVT prophylaxis: Heparin Code Status: Full code Family Communication:  Disposition Plan: cont to follow on med surg Consultants:   ortho Procedures:   I and D of right knee Antimicrobials:  Anti-infectives (From admission, onward)   Start     Dose/Rate Route Frequency Ordered Stop   03/10/17 0700  vancomycin (VANCOCIN) IVPB 750 mg/150 ml premix     750 mg 150 mL/hr over 60 Minutes Intravenous Every 12 hours 03/09/17 1837     03/09/17 2030  vancomycin (VANCOCIN) 500 mg in sodium chloride 0.9 % 100 mL IVPB  Status:  Discontinued     500 mg 100 mL/hr over 60 Minutes Intravenous To Short Stay 03/09/17 2019 03/10/17 1356   03/09/17 1845  vancomycin (VANCOCIN) 1,500 mg in sodium chloride 0.9 % 500 mL IVPB     1,500 mg 250 mL/hr over 120 Minutes Intravenous  Once 03/09/17 1833 03/09/17 2215       Objective: Vitals:   03/09/17 2347 03/10/17 0219 03/10/17 0542 03/10/17 1407  BP: (!) 107/56 (!) 116/58 (!) 102/56 (!) 130/56  Pulse: 89 87 95 (!) 102  Resp: 18 17 18 20   Temp: 98.4 F (36.9 C) 97.8 F (36.6 C) 98.2 F (36.8 C) 98.7 F (37.1 C)  TempSrc: Oral Oral Oral Oral  SpO2: 94% 98% 96% 96%  Weight: 78.7  kg (173 lb 8 oz)     Height: 5\' 5"  (1.651 m)       Intake/Output Summary (Last 24 hours) at 03/10/2017 1913 Last data filed at 03/10/2017 1740 Gross per 24 hour  Intake 2520 ml  Output 661 ml  Net 1859 ml   Filed Weights   03/09/17 2347  Weight: 78.7 kg (173 lb 8 oz)    Examination: General exam: Appears comfortable  HEENT: PERRLA, oral mucosa moist, no sclera icterus or thrush Respiratory system: Clear to auscultation. Respiratory effort normal. Cardiovascular system: S1 & S2 heard, RRR.  No murmurs  Gastrointestinal system: Abdomen soft, non-tender, nondistended. Normal bowel sound. No organomegaly Central nervous system: Alert and oriented. No focal neurological deficits. Extremities: No cyanosis, clubbing or edema- right knee in an immobilizer  Skin: No rashes or ulcers Psychiatry:  Mood & affect appropriate.     Data Reviewed: I have personally reviewed following labs and imaging studies  CBC: Recent Labs  Lab 03/09/17 1538 03/10/17 0336  WBC 15.0* 10.7*  NEUTROABS 12.3*  --   HGB 12.1 11.5*  HCT 38.6 37.2  MCV 84.6 84.9  PLT 272 811   Basic Metabolic Panel: Recent Labs  Lab 03/09/17 1538 03/10/17 0336  NA 135 134*  K 4.0 3.9  CL 105 103  CO2 21* 20*  GLUCOSE 110* 276*  BUN 20 25*  CREATININE 1.10* 1.20*  CALCIUM 8.6* 8.5*   GFR: Estimated Creatinine Clearance: 42 mL/min (A) (by C-G formula based on SCr of 1.2 mg/dL (H)). Liver Function Tests: No results for input(s): AST, ALT, ALKPHOS, BILITOT, PROT, ALBUMIN in the last 168 hours. No results for input(s): LIPASE, AMYLASE in the last 168 hours. No results for input(s): AMMONIA in the last 168 hours. Coagulation Profile: No results for input(s): INR, PROTIME in the last 168 hours. Cardiac Enzymes: No results for input(s): CKTOTAL, CKMB, CKMBINDEX, TROPONINI in the last 168 hours. BNP (last 3 results) No results for input(s): PROBNP in the last 8760 hours. HbA1C: No results for input(s): HGBA1C  in the last 72 hours. CBG: Recent Labs  Lab 03/09/17 2106 03/09/17 2336 03/10/17 0735 03/10/17 1211 03/10/17 1717  GLUCAP 144* 199* 252* 161* 162*   Lipid Profile: No results for input(s): CHOL, HDL, LDLCALC, TRIG, CHOLHDL, LDLDIRECT in the last 72 hours. Thyroid Function Tests: No results for input(s): TSH, T4TOTAL, FREET4, T3FREE, THYROIDAB in the last 72 hours. Anemia Panel: No results for input(s): VITAMINB12, FOLATE, FERRITIN, TIBC, IRON, RETICCTPCT in the last 72 hours. Urine analysis:    Component Value Date/Time   COLORURINE YELLOW 12/24/2016 [place] 12/24/2016 1239   LABSPEC 1.025 12/24/2016 1239   PHURINE 6.0 12/24/2016 1239   GLUCOSEU NEGATIVE 12/24/2016 1239   HGBUR NEGATIVE 12/24/2016 1239   BILIRUBINUR NEGATIVE 12/24/2016 1239   KETONESUR TRACE (A) 12/24/2016 1239   PROTEINUR 30 (A) 09/16/2014 1331   UROBILINOGEN 0.2 12/24/2016 1239   NITRITE NEGATIVE 12/24/2016 1239   LEUKOCYTESUR NEGATIVE 12/24/2016 1239   Sepsis Labs: @LABRCNTIP (procalcitonin:4,lacticidven:4) ) Recent Results (from the past 240 hour(s))  Aerobic/Anaerobic Culture (surgical/deep wound)     Status: None (Preliminary result)   Collection Time: 03/09/17  8:22 PM  Result Value Ref Range Status   Specimen Description WOUND RIGHT KNEE  Final   Special Requests NONE  Final   Gram Stain   Final    FEW WBC PRESENT, PREDOMINANTLY PMN NO ORGANISMS SEEN    Culture CULTURE REINCUBATED FOR BETTER GROWTH  Final   Report Status PENDING  Incomplete         Radiology Studies: Dg Chest Portable 1 View  Result Date: 03/09/2017 CLINICAL DATA:  Preoperative for wound infection EXAM: PORTABLE CHEST 1 VIEW COMPARISON:  September 13, 2016 FINDINGS: The heart size and mediastinal contours are within normal limits. The aorta is tortuous. Both lungs are clear. The visualized skeletal structures are unremarkable. IMPRESSION: No active cardiopulmonary disease. Electronically Signed   By:  Abelardo Diesel M.D.   On: 03/09/2017 19:31   Dg Knee Complete 4 Views Right  Result Date: 03/09/2017 CLINICAL DATA:  Status post fall 8 days ago with right knee pain EXAM: RIGHT KNEE - COMPLETE 4+ VIEW COMPARISON:  None. FINDINGS: No evidence of fracture, dislocation. 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 There is marked soft tissue swelling in the anterior knee. Soft tissues are unremarkable. IMPRESSION: No acute fracture. Marked soft tissue swelling of the anterior right knee. Electronically Signed   By: Abelardo Diesel M.D.   On: 03/09/2017 16:26      Scheduled Meds: . atorvastatin  40 mg Oral QHS  . DULoxetine  120 mg Oral QHS  . ezetimibe  10 mg Oral QHS  . gabapentin  100 mg Oral QHS  . insulin aspart  0-9 Units Subcutaneous TID WC  .  loratadine  10 mg Oral QHS  . pantoprazole  40 mg Oral Daily  . predniSONE  5 mg Oral QHS  . sodium chloride flush  3 mL Intravenous Q12H   Continuous Infusions: . sodium chloride 250 mL (03/10/17 0627)  . vancomycin 750 mg (03/10/17 1826)     LOS: 1 day    Time spent in minutes: 35    Debbe Odea, MD [facility] Pager: [email or web address] Password [email or web address] 03/10/2017, 7:13 PM

## 2017-03-10 NOTE — Progress Notes (Signed)
Subjective: 1 Day Post-Op Procedure(s) (LRB): IRRIGATION AND DEBRIDEMENT RIGHT KNEE PRE-PATELLAR BURSA (Right) Patient reports pain as mild and moderate.  Concerned about pain med orders. No other c/o. Pain well controlled right now.  Objective: Vital signs in last 24 hours: Temp:  [97.7 F (36.5 C)-99.5 F (37.5 C)] 98.2 F (36.8 C) (12/31 0542) Pulse Rate:  [87-108] 95 (12/31 0542) Resp:  [14-20] 18 (12/31 0542) BP: (102-119)/(49-75) 102/56 (12/31 0542) SpO2:  [91 %-98 %] 96 % (12/31 0542) Weight:  [78.7 kg (173 lb 8 oz)] 78.7 kg (173 lb 8 oz) (12/30 2347)  Intake/Output from previous day: 12/30 0701 - 12/31 0700 In: 1200 [P.O.:300; I.V.:700] Out: 311 [Urine:301; Blood:10] Intake/Output this shift: No intake/output data recorded.  Recent Labs    03/09/17 1538 03/10/17 0336  HGB 12.1 11.5*   Recent Labs    03/09/17 1538 03/10/17 0336  WBC 15.0* 10.7*  RBC 4.56 4.38  HCT 38.6 37.2  PLT 272 268   Recent Labs    03/09/17 1538 03/10/17 0336  NA 135 134*  K 4.0 3.9  CL 105 103  CO2 21* 20*  BUN 20 25*  CREATININE 1.10* 1.20*  GLUCOSE 110* 276*  CALCIUM 8.6* 8.5*   No results for input(s): LABPT, INR in the last 72 hours.  Neurologically intact ABD soft Neurovascular intact Sensation intact distally Intact pulses distally Dorsiflexion/Plantar flexion intact Incision: dressing C/D/I and no drainage No cellulitis present Compartment soft no sign of DVT  Assessment/Plan: 1 Day Post-Op Procedure(s) (LRB): IRRIGATION AND DEBRIDEMENT RIGHT KNEE PRE-PATELLAR BURSA (Right) Advance diet Up with therapy D/C IV fluids  Transfer to [place] per Dr. Wynelle Link - to remain on hospitalist team service Pain mgmt Discussed with Dr. Mliss Fritz, Conley Rolls. 03/10/2017, 8:22 AM

## 2017-03-10 NOTE — Progress Notes (Signed)
Pt does not want to go to [facility], she states she had a bad experience there.

## 2017-03-10 NOTE — Progress Notes (Signed)
Patient received  from PACU  to  room 6N29. Alert and oriented x4.Vital signs WNL. Oriented to room and call bell. Pain 1/10 at this time.

## 2017-03-11 ENCOUNTER — Encounter (HOSPITAL_COMMUNITY): Payer: Self-pay

## 2017-03-11 LAB — GLUCOSE, CAPILLARY
GLUCOSE-CAPILLARY: 134 mg/dL — AB (ref 65–99)
Glucose-Capillary: 154 mg/dL — ABNORMAL HIGH (ref 65–99)
Glucose-Capillary: 111 mg/dL — ABNORMAL HIGH (ref 65–99)

## 2017-03-11 LAB — CBC
HCT: 34.4 % — ABNORMAL LOW (ref 36.0–46.0)
Hemoglobin: 10.7 g/dL — ABNORMAL LOW (ref 12.0–15.0)
MCH: 26 pg (ref 26.0–34.0)
MCHC: 31.1 g/dL (ref 30.0–36.0)
MCV: 83.5 fL (ref 78.0–100.0)
PLATELETS: 325 10*3/uL (ref 150–400)
RBC: 4.12 MIL/uL (ref 3.87–5.11)
RDW: 16 % — AB (ref 11.5–15.5)
WBC: 14 10*3/uL — AB (ref 4.0–10.5)

## 2017-03-11 MED ORDER — LINAGLIPTIN 5 MG PO TABS: 5.0000 mg | ORAL_TABLET | Freq: Every day | ORAL | Status: DC

## 2017-03-11 NOTE — Progress Notes (Signed)
   Subjective: 2 Days Post-Op Procedure(s) (LRB): IRRIGATION AND DEBRIDEMENT RIGHT KNEE PRE-PATELLAR BURSA (Right) Patient reports pain as mild.   Patient seen in rounds with [place] Patient is well, and has had no acute complaints or problems other than confusion about what her plan of care is. No SOB. No chest pain. Voiding well. Positive flatus.    Objective: Vital signs in last 24 hours: Temp:  [97.9 F (36.6 C)-98.7 F (37.1 C)] 97.9 F (36.6 C) (01/01 0400) Pulse Rate:  [93-102] 93 (01/01 0400) Resp:  [19-20] 20 (01/01 0400) BP: (124-130)/(51-70) 124/70 (01/01 0400) SpO2:  [96 %-97 %] 96 % (01/01 0400)  Intake/Output from previous day:  Intake/Output Summary (Last 24 hours) at 03/11/2017 0752 Last data filed at 03/11/2017 0402 Gross per 24 hour  Intake 1320 ml  Output 1810 ml  Net -490 ml    I Labs: Recent Labs    03/09/17 1538 03/10/17 0336 03/11/17 0336  HGB 12.1 11.5* 10.7*   Recent Labs    03/10/17 0336 03/11/17 0336  WBC 10.7* 14.0*  RBC 4.38 4.12  HCT 37.2 34.4*  PLT 268 325   Recent Labs    03/09/17 1538 03/10/17 0336  NA 135 134*  K 4.0 3.9  CL 105 103  CO2 21* 20*  BUN 20 25*  CREATININE 1.10* 1.20*  GLUCOSE 110* 276*  CALCIUM 8.6* 8.5*    EXAM General - Patient is Alert and Oriented Extremity - Neurologically intact Intact pulses distally Dorsiflexion/Plantar flexion intact Compartment soft Dressing/Incision - clean, dry, no drainage except from hemovac Motor Function - intact, moving foot and toes well on exam.   Past Medical History:  Diagnosis Date  . Asthma   . Complication of anesthesia    Anesthesia told her years ago she got too cold during surgery  . Diabetes mellitus without complication ([place])   . Emphysema   . Fibromyalgia   . Osteoporosis   . Rheumatoid arthritis(714.0)   . Sleep apnea    no cpap    Assessment/Plan: 2 Days Post-Op Procedure(s) (LRB): IRRIGATION AND DEBRIDEMENT RIGHT KNEE PRE-PATELLAR BURSA  (Right) Principal Problem:   Arthritis, septic, knee (HCC) Active Problems:   Asthma   GERD   Rheumatoid arthritis ([place])   DIARRHEA, CHRONIC  Estimated body mass index is 28.87 kg/m as calculated from the following:   Height as of this encounter: 5\' 5"  (1.651 m).   Weight as of this encounter: 78.7 kg (173 lb 8 oz).   DVT Prophylaxis - heparin Weight-Bearing as tolerated  She is doing well. No growth on cultures at this time. Will continue to watch. Continue vanc per pharmacy and ID. Plan for transfer to [facility] for possible repeat I&D by Dr. Wynelle Link tomorrow. Will make patient NPO after midnight in case.   Ardeen Jourdain, PA-C Orthopaedic Surgery 03/11/2017, 7:52 AM

## 2017-03-11 NOTE — Progress Notes (Signed)
Called [facility] for pt transport to [facility].

## 2017-03-11 NOTE — Progress Notes (Signed)
Pt will be transferred to [place] today. Receiving antibiotic at present.

## 2017-03-11 NOTE — Progress Notes (Signed)
Report given to Sharman Crate, RN for [phone], 6 [place].  Will plan on transport for after lunch so pt will not miss lunch.  Pt informed.

## 2017-03-11 NOTE — Progress Notes (Signed)
Pt to [place] via [facility], Sharman Crate, RN notified that pt is on the way.

## 2017-03-11 NOTE — Progress Notes (Signed)
PROGRESS NOTE    [name]   HDQ:[id number]  DOB: April 20, 1940  DOA: 03/09/2017 PCP: [name], MD   Brief Narrative:   [name] is a 77 y.o. female with medical history significant of septic right knee in mid 2016 (culture data reviewed and all no growth) comes in with several days of redness, swelling and warmth to right knee beginning after a fall and scratch. She failed Doxy as outpt.  Subjective: currently no complaints.  ROS: no complaints of nausea, vomiting, constipation diarrhea, cough, dyspnea or dysuria. No other complaints.   Assessment & Plan:   Principal Problem:   Arthritis, septic, knee - h/o rheumatoid arthritis  - ortho to manage- s/p I and D- ortho would like her to go to [facility] for possible surgery tomorrow  Active Problems:   Asthma - stable - cont Nebs    GERD - PPI    Rheumatoid arthritis - chronic prednisone   DVT prophylaxis: Heparin Code Status: Full code Family Communication:  Disposition Plan: cont to follow on med surg Consultants:   ortho Procedures:   I and D of right knee Antimicrobials:  Anti-infectives (From admission, onward)   Start     Dose/Rate Route Frequency Ordered Stop   03/10/17 0700  vancomycin (VANCOCIN) IVPB 750 mg/150 ml premix     750 mg 150 mL/hr over 60 Minutes Intravenous Every 12 hours 03/09/17 1837     03/09/17 2030  vancomycin (VANCOCIN) 500 mg in sodium chloride 0.9 % 100 mL IVPB  Status:  Discontinued     500 mg 100 mL/hr over 60 Minutes Intravenous To Short Stay 03/09/17 2019 03/10/17 1356   03/09/17 1845  vancomycin (VANCOCIN) 1,500 mg in sodium chloride 0.9 % 500 mL IVPB     1,500 mg 250 mL/hr over 120 Minutes Intravenous  Once 03/09/17 1833 03/09/17 2215       Objective: Vitals:   03/10/17 1407 03/10/17 2118 03/11/17 0400 03/11/17 1343  BP: (!) 130/56 (!) 126/51 124/70 125/70  Pulse: (!) 102 99 93 85  Resp: 20 19 20 19   Temp: 98.7 F (37.1 C) 98.7 F (37.1 C) 97.9 F (36.6 C) 98.8 F  (37.1 C)  TempSrc: Oral Oral  Oral  SpO2: 96% 97% 96% 98%  Weight:      Height:        Intake/Output Summary (Last 24 hours) at 03/11/2017 1414 Last data filed at 03/11/2017 1332 Gross per 24 hour  Intake 1320 ml  Output 2260 ml  Net -940 ml   Filed Weights   03/09/17 2347  Weight: 78.7 kg (173 lb 8 oz)    Examination: General exam: Appears comfortable  HEENT: PERRLA, oral mucosa moist, no sclera icterus or thrush Respiratory system: Clear to auscultation. Respiratory effort normal. Cardiovascular system: S1 & S2 heard, RRR.  No murmurs  Gastrointestinal system: Abdomen soft, non-tender, nondistended. Normal bowel sound. No organomegaly Central nervous system: Alert and oriented. No focal neurological deficits. Extremities: No cyanosis, clubbing or edema- right knee in an immobilizer  Skin: No rashes or ulcers Psychiatry:  Mood & affect appropriate.     Data Reviewed: I have personally reviewed following labs and imaging studies  CBC: Recent Labs  Lab 03/09/17 1538 03/10/17 0336 03/11/17 0336  WBC 15.0* 10.7* 14.0*  NEUTROABS 12.3*  --   --   HGB 12.1 11.5* 10.7*  HCT 38.6 37.2 34.4*  MCV 84.6 84.9 83.5  PLT 272 268 119   Basic Metabolic Panel: Recent Labs  Lab 03/09/17 1538 03/10/17 0336  NA 135 134*  K 4.0 3.9  CL 105 103  CO2 21* 20*  GLUCOSE 110* 276*  BUN 20 25*  CREATININE 1.10* 1.20*  CALCIUM 8.6* 8.5*   GFR: Estimated Creatinine Clearance: 42 mL/min (A) (by C-G formula based on SCr of 1.2 mg/dL (H)). Liver Function Tests: No results for input(s): AST, ALT, ALKPHOS, BILITOT, PROT, ALBUMIN in the last 168 hours. No results for input(s): LIPASE, AMYLASE in the last 168 hours. No results for input(s): AMMONIA in the last 168 hours. Coagulation Profile: No results for input(s): INR, PROTIME in the last 168 hours. Cardiac Enzymes: No results for input(s): CKTOTAL, CKMB, CKMBINDEX, TROPONINI in the last 168 hours. BNP (last 3 results) No results  for input(s): PROBNP in the last 8760 hours. HbA1C: No results for input(s): HGBA1C in the last 72 hours. CBG: Recent Labs  Lab 03/10/17 1211 03/10/17 1717 03/10/17 2130 03/11/17 0748 03/11/17 1217  GLUCAP 161* 162* 181* 154* 134*   Lipid Profile: No results for input(s): CHOL, HDL, LDLCALC, TRIG, CHOLHDL, LDLDIRECT in the last 72 hours. Thyroid Function Tests: No results for input(s): TSH, T4TOTAL, FREET4, T3FREE, THYROIDAB in the last 72 hours. Anemia Panel: No results for input(s): VITAMINB12, FOLATE, FERRITIN, TIBC, IRON, RETICCTPCT in the last 72 hours. Urine analysis:    Component Value Date/Time   COLORURINE YELLOW 12/24/2016 [place] 12/24/2016 1239   LABSPEC 1.025 12/24/2016 1239   PHURINE 6.0 12/24/2016 1239   GLUCOSEU NEGATIVE 12/24/2016 1239   HGBUR NEGATIVE 12/24/2016 1239   BILIRUBINUR NEGATIVE 12/24/2016 1239   KETONESUR TRACE (A) 12/24/2016 1239   PROTEINUR 30 (A) 09/16/2014 1331   UROBILINOGEN 0.2 12/24/2016 1239   NITRITE NEGATIVE 12/24/2016 1239   LEUKOCYTESUR NEGATIVE 12/24/2016 1239   Sepsis Labs: @LABRCNTIP (procalcitonin:4,lacticidven:4) ) Recent Results (from the past 240 hour(s))  Aerobic/Anaerobic Culture (surgical/deep wound)     Status: None (Preliminary result)   Collection Time: 03/09/17  8:22 PM  Result Value Ref Range Status   Specimen Description WOUND RIGHT KNEE  Final   Special Requests NONE  Final   Gram Stain   Final    FEW WBC PRESENT, PREDOMINANTLY PMN NO ORGANISMS SEEN    Culture CULTURE REINCUBATED FOR BETTER GROWTH  Final   Report Status PENDING  Incomplete         Radiology Studies: Dg Chest Portable 1 View  Result Date: 03/09/2017 CLINICAL DATA:  Preoperative for wound infection EXAM: PORTABLE CHEST 1 VIEW COMPARISON:  September 13, 2016 FINDINGS: The heart size and mediastinal contours are within normal limits. The aorta is tortuous. Both lungs are clear. The visualized skeletal structures are  unremarkable. IMPRESSION: No active cardiopulmonary disease. Electronically Signed   By: Abelardo Diesel M.D.   On: 03/09/2017 19:31   Dg Knee Complete 4 Views Right  Result Date: 03/09/2017 CLINICAL DATA:  Status post fall 8 days ago with right knee pain EXAM: RIGHT KNEE - COMPLETE 4+ VIEW COMPARISON:  None. FINDINGS: No evidence of fracture, dislocation. 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 There is marked soft tissue swelling in the anterior knee. Soft tissues are unremarkable. IMPRESSION: No acute fracture. Marked soft tissue swelling of the anterior right knee. Electronically Signed   By: Abelardo Diesel M.D.   On: 03/09/2017 16:26      Scheduled Meds: . atorvastatin  40 mg Oral QHS  . DULoxetine  120 mg Oral QHS  . ezetimibe  10 mg Oral QHS  . gabapentin  100 mg Oral QHS  . heparin injection (subcutaneous)  5,000 Units Subcutaneous Q8H  . insulin aspart  0-9 Units Subcutaneous TID WC  . loratadine  10 mg Oral QHS  . pantoprazole  40 mg Oral Daily  . predniSONE  5 mg Oral QHS  . sodium chloride flush  3 mL Intravenous Q12H   Continuous Infusions: . sodium chloride 250 mL (03/10/17 0627)  . vancomycin Stopped (03/11/17 1047)     LOS: 2 days    Time spent in minutes: 35    Debbe Odea, MD [facility] Pager: [email or web address] Password [email or web address] 03/11/2017, 2:14 PM

## 2017-03-12 DIAGNOSIS — Z7952 Long term (current) use of systemic steroids: Secondary | ICD-10-CM

## 2017-03-12 DIAGNOSIS — M06261 Rheumatoid bursitis, right knee: Secondary | ICD-10-CM

## 2017-03-12 DIAGNOSIS — M009 Pyogenic arthritis, unspecified: Secondary | ICD-10-CM

## 2017-03-12 DIAGNOSIS — Z880 Allergy status to penicillin: Secondary | ICD-10-CM

## 2017-03-12 DIAGNOSIS — Z87891 Personal history of nicotine dependence: Secondary | ICD-10-CM

## 2017-03-12 DIAGNOSIS — Z882 Allergy status to sulfonamides status: Secondary | ICD-10-CM

## 2017-03-12 DIAGNOSIS — Z96653 Presence of artificial knee joint, bilateral: Secondary | ICD-10-CM

## 2017-03-12 LAB — GLUCOSE, CAPILLARY
GLUCOSE-CAPILLARY: 138 mg/dL — AB (ref 65–99)
Glucose-Capillary: 85 mg/dL (ref 65–99)
Glucose-Capillary: 94 mg/dL (ref 65–99)
Glucose-Capillary: 180 mg/dL — ABNORMAL HIGH (ref 65–99)

## 2017-03-12 LAB — CBC
HEMATOCRIT: 35.3 % — AB (ref 36.0–46.0)
Hemoglobin: 11.2 g/dL — ABNORMAL LOW (ref 12.0–15.0)
MCH: 26.5 pg (ref 26.0–34.0)
MCHC: 31.7 g/dL (ref 30.0–36.0)
MCV: 83.6 fL (ref 78.0–100.0)
Platelets: 313 10*3/uL (ref 150–400)
RBC: 4.22 MIL/uL (ref 3.87–5.11)
RDW: 15.8 % — ABNORMAL HIGH (ref 11.5–15.5)
WBC: 10.6 10*3/uL — AB (ref 4.0–10.5)

## 2017-03-12 LAB — BASIC METABOLIC PANEL
ANION GAP: 8 (ref 5–15)
BUN: 31 mg/dL — ABNORMAL HIGH (ref 6–20)
CO2: 26 mmol/L (ref 22–32)
Calcium: 8.7 mg/dL — ABNORMAL LOW (ref 8.9–10.3)
Chloride: 106 mmol/L (ref 101–111)
Creatinine, Ser: 1.08 mg/dL — ABNORMAL HIGH (ref 0.44–1.00)
GFR, EST AFRICAN AMERICAN: 57 mL/min — AB (ref >60)
GFR, EST NON AFRICAN AMERICAN: 49 mL/min — AB (ref >60)
GLUCOSE: 153 mg/dL — AB (ref 65–99)
POTASSIUM: 4.7 mmol/L (ref 3.5–5.1)
Sodium: 140 mmol/L (ref 135–145)

## 2017-03-12 LAB — VANCOMYCIN, TROUGH: Vancomycin Tr: 17 ug/mL (ref 15–20)

## 2017-03-12 MED ORDER — SODIUM CHLORIDE 0.9% FLUSH
10.0000 mL | INTRAVENOUS | Status: DC | PRN
  Administered 2017-03-13: 10 mL
  Filled 2017-03-12: qty 40

## 2017-03-12 NOTE — Progress Notes (Signed)
Spoke with bedside RN Hinton Dyer regarding PICC placement.  Per nurse, patient is not discharging home today.  Per MD note ID is being consulted.  Blood cultures still pending.  Will hold off on PICC until ID gives approval.  Carolee Rota, RN VAST

## 2017-03-12 NOTE — Progress Notes (Signed)
Pharmacy Antibiotic Note  [name] is a 77 y.o. female admitted on 03/09/2017 with PMH of septic right knee in 2016, failed doxycyline as an outptient, and now admitted with septic arthritis s/p I&D on 12/30 and possible repeat I&D per ortho on 03/12/17.  Pharmacy has been consulted for Vancomycin dosing.  Plan:  Continue Vancomycin 750 mg IV q12h.  Measure Vanc trough/peak as needed.  Follow up renal fxn, culture results, and clinical course.  Height: 5\' 5"  (165.1 cm) Weight: 173 lb 8 oz (78.7 kg) IBW/kg (Calculated) : 57  Temp (24hrs), Avg:98.2 F (36.8 C), Min:97.7 F (36.5 C), Max:98.8 F (37.1 C)  Recent Labs  Lab 03/09/17 1538 03/09/17 1551 03/10/17 0336 03/11/17 0336 03/12/17 0620  WBC 15.0*  --  10.7* 14.0* 10.6*  CREATININE 1.10*  --  1.20*  --  1.08*  LATICACIDVEN  --  1.18  --   --   --   VANCOTROUGH  --   --   --   --  17    Estimated Creatinine Clearance: 46.7 mL/min (A) (by C-G formula based on SCr of 1.08 mg/dL (H)).    Allergies  Allergen Reactions  . Penicillins Hives    Has patient had a PCN reaction causing immediate rash, facial/tongue/throat swelling, SOB or lightheadedness with hypotension: Yes Has patient had a PCN reaction causing severe rash involving mucus membranes or skin necrosis: No Has patient had a PCN reaction that required hospitalization: No Has patient had a PCN reaction occurring within the last 10 years: No If all of the above answers are "NO", then may proceed with Cephalosporin use.  . Sulfonamide Derivatives Nausea And Vomiting    REACTION: stomach problems    Antimicrobials this admission: 12/30 >> Vancomycin >>   Dose adjustments this admission: 03/12/17 0630 VT = 17  Microbiology results: 12/30: R knee wound: no orgs seen; cxt reincubated 12/30: fungal:   Thank you for allowing pharmacy to be a part of this patient's care.  Gretta Arab PharmD, BCPS Pager [phone] 03/12/2017 7:27 AM

## 2017-03-12 NOTE — Progress Notes (Signed)
Patient Demographics:    [name], is a 77 y.o. female, DOB - 04-Nov-1940, GNF:[id number]  Admit date - 03/09/2017   Admitting Physician Phillips Grout, MD  Outpatient Primary MD for the patient is [name], MD  LOS - 3   Chief Complaint  Patient presents with  . Knee Pain        Subjective:    [name] today has no fevers, no emesis,  No chest pain,  Rt knee pain is better   Assessment  & Plan :    Principal Problem:   Arthritis, septic, knee (HCC) Active Problems:   Asthma   GERD   Rheumatoid arthritis ([place])   DIARRHEA, CHRONIC  Brief Summary:- Leslie Scharf Reedis a 77 y.o.femalewith medical history significant for RA and Prior Rt TRA and prior h/oseptic right knee in mid 2016 (culture data reviewed and all no growth) admitted on 03/09/17 with Rt Knee redness, swelling and warmth  after a fall and scratch. She failed Doxy as outpt. Dxed with Pre-Patella Bursitis and started on Vancomycin on 03/09/17  Plan: 1)Rt Knee prepatellar bursitis-no fevers, WBC is down to 10.6 from 15.0, OR wound cultures from 03/09/2017 with gram-positive cocci, presumably staph, d/w Dr Graylon Good from ID, she recommends IV vancomycin for now for up to 4 weeks, noted to have final ID and sensitivity of the OR wound cultures are still pending.  Prior to admission patient was on doxycycline.  Orthopedic consult from Dr. Maureen Ralphs appreciated.  Treat for 4 weeks for pre-patellar septic bursitis,  As per micro Lab she has 4 Gram positive staph species. Not all identified thus far.   2)Asthma-stable , no acute flareup at this time, continue prednisone 5 mg daily and nebulizers  3)Rheumatoid Arthritis-with chronic deformities, continue prednisone 5 mg daily  4)Disposition-patient will need to be discharged with PICC line and IV antibiotics for at least 4 weeks, possibly up to 6 weeks, at this time ???  Home with home health  versus skilled nursing facility.  Please note that the patient lives alone and she has muscular deformity secondary to severe rheumatoid arthritis.  Physical therapy eval pending  Code Status : Full  Consults  :  ID/ortho/Phy therapy   DVT Prophylaxis  :   Heparin   Lab Results  Component Value Date   PLT 313 03/12/2017    Inpatient Medications  Scheduled Meds: . atorvastatin  40 mg Oral QHS  . DULoxetine  120 mg Oral QHS  . ezetimibe  10 mg Oral QHS  . gabapentin  100 mg Oral QHS  . heparin injection (subcutaneous)  5,000 Units Subcutaneous Q8H  . insulin aspart  0-9 Units Subcutaneous TID WC  . loratadine  10 mg Oral QHS  . pantoprazole  40 mg Oral Daily  . predniSONE  5 mg Oral QHS  . sodium chloride flush  3 mL Intravenous Q12H   Continuous Infusions: . sodium chloride 250 mL (03/10/17 0627)  . vancomycin 750 mg (03/12/17 0804)   PRN Meds:.sodium chloride, albuterol, budesonide (PULMICORT) nebulizer solution, fluticasone furoate-vilanterol, morphine, ondansetron **OR** ondansetron (ZOFRAN) IV, oxyCODONE, sodium chloride flush    Anti-infectives (From admission, onward)   Start     Dose/Rate Route Frequency Ordered Stop   03/10/17 0700  vancomycin (VANCOCIN) IVPB 750 mg/150 ml premix     750 mg 150 mL/hr over 60 Minutes Intravenous Every 12 hours 03/09/17 1837     03/09/17 2030  vancomycin (VANCOCIN) 500 mg in sodium chloride 0.9 % 100 mL IVPB  Status:  Discontinued     500 mg 100 mL/hr over 60 Minutes Intravenous To Short Stay 03/09/17 2019 03/10/17 1356   03/09/17 1845  vancomycin (VANCOCIN) 1,500 mg in sodium chloride 0.9 % 500 mL IVPB     1,500 mg 250 mL/hr over 120 Minutes Intravenous  Once 03/09/17 1833 03/09/17 2215        Objective:   Vitals:   03/11/17 1343 03/11/17 1519 03/11/17 2216 03/12/17 0548  BP: 125/70 129/71 (!) 149/70 131/75  Pulse: 85 83 84 81  Resp: 19 16 17 16   Temp: 98.8 F (37.1 C) 97.7 F (36.5 C) 98.3 F (36.8 C) 97.8 F (36.6  C)  TempSrc: Oral Oral Oral Oral  SpO2: 98% 99% 95% 93%  Weight:      Height:        Wt Readings from Last 3 Encounters:  03/09/17 78.7 kg (173 lb 8 oz)  01/15/17 80.3 kg (177 lb)  12/24/16 82.8 kg (182 lb 9.6 oz)     Intake/Output Summary (Last 24 hours) at 03/12/2017 1419 Last data filed at 03/12/2017 0900 Gross per 24 hour  Intake 870 ml  Output 1300 ml  Net -430 ml     Physical Exam  Gen:- Awake Alert,  In no apparent distress  HEENT:- [place].AT, No sclera icterus Neck-Supple Neck,No JVD,.  Lungs-  CTAB  CV- S1, S2 normal Abd-  +ve B.Sounds, Abd Soft, No tenderness,    Extremity/Skin:- Rt Knee exam shows no effusion, No drainage, warmth or erythema, Able to do straight leg raise with 15 degree extensor lag which is the same status her knee has been in for several years, Drain removed 03/12/17    Data Review:   Micro Results Recent Results (from the past 240 hour(s))  Fungus Culture With Stain     Status: None (Preliminary result)   Collection Time: 03/09/17  8:22 PM  Result Value Ref Range Status   Fungus Stain Final report  Final    Comment: (NOTE) Performed At: [facility] [place], [place] [id number] Rush Farmer MD [id number]    Fungus (Mycology) Culture PENDING  Incomplete   Fungal Source RIGHT  Final    Comment: KNEE  Aerobic/Anaerobic Culture (surgical/deep wound)     Status: None (Preliminary result)   Collection Time: 03/09/17  8:22 PM  Result Value Ref Range Status   Specimen Description WOUND RIGHT KNEE  Final   Special Requests NONE  Final   Gram Stain   Final    FEW WBC PRESENT, PREDOMINANTLY PMN NO ORGANISMS SEEN    Culture   Final    RARE STAPHYLOCOCCUS SPECIES (COAGULASE NEGATIVE) NO ANAEROBES ISOLATED; CULTURE IN PROGRESS FOR 5 DAYS    Report Status PENDING  Incomplete   Organism ID, Bacteria STAPHYLOCOCCUS SPECIES (COAGULASE NEGATIVE)  Final      Susceptibility   Staphylococcus species (coagulase negative) -  MIC*    CIPROFLOXACIN <=0.5 SENSITIVE Sensitive     ERYTHROMYCIN 0.5 SENSITIVE Sensitive     GENTAMICIN <=0.5 SENSITIVE Sensitive     OXACILLIN <=0.25 SENSITIVE Sensitive     TETRACYCLINE >=16 RESISTANT Resistant     VANCOMYCIN <=0.5 SENSITIVE Sensitive     TRIMETH/SULFA <=10 SENSITIVE Sensitive  CLINDAMYCIN <=0.25 SENSITIVE Sensitive     RIFAMPIN <=0.5 SENSITIVE Sensitive     Inducible Clindamycin NEGATIVE Sensitive     * RARE STAPHYLOCOCCUS SPECIES (COAGULASE NEGATIVE)  Fungus Culture Result     Status: None   Collection Time: 03/09/17  8:22 PM  Result Value Ref Range Status   Result 1 Comment  Final    Comment: (NOTE) KOH/Calcofluor preparation:  no fungus observed. Performed At: [facility] [place], [place] [id number] Rush Farmer MD [id number]     Radiology Reports Dg Chest Portable 1 View  Result Date: 03/09/2017 CLINICAL DATA:  Preoperative for wound infection EXAM: PORTABLE CHEST 1 VIEW COMPARISON:  September 13, 2016 FINDINGS: The heart size and mediastinal contours are within normal limits. The aorta is tortuous. Both lungs are clear. The visualized skeletal structures are unremarkable. IMPRESSION: No active cardiopulmonary disease. Electronically Signed   By: Abelardo Diesel M.D.   On: 03/09/2017 19:31   Dg Knee Complete 4 Views Right  Result Date: 03/09/2017 CLINICAL DATA:  Status post fall 8 days ago with right knee pain EXAM: RIGHT KNEE - COMPLETE 4+ VIEW COMPARISON:  None. FINDINGS: No evidence of fracture, dislocation. 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 There is marked soft tissue swelling in the anterior knee. Soft tissues are unremarkable. IMPRESSION: No acute fracture. Marked soft tissue swelling of the anterior right knee. Electronically Signed   By: Abelardo Diesel M.D.   On: 03/09/2017 16:26     CBC Recent Labs  Lab 03/09/17 1538 03/10/17 0336 03/11/17 0336 03/12/17 0620  WBC 15.0* 10.7* 14.0* 10.6*  HGB 12.1 11.5* 10.7* 11.2*   HCT 38.6 37.2 34.4* 35.3*  PLT 272 268 325 313  MCV 84.6 84.9 83.5 83.6  MCH 26.5 26.3 26.0 26.5  MCHC 31.3 30.9 31.1 31.7  RDW 16.2* 16.2* 16.0* 15.8*  LYMPHSABS 1.1  --   --   --   MONOABS 1.5*  --   --   --   EOSABS 0.1  --   --   --   BASOSABS 0.0  --   --   --     Chemistries  Recent Labs  Lab 03/09/17 1538 03/10/17 0336 03/12/17 0620  NA 135 134* 140  K 4.0 3.9 4.7  CL 105 103 106  CO2 21* 20* 26  GLUCOSE 110* 276* 153*  BUN 20 25* 31*  CREATININE 1.10* 1.20* 1.08*  CALCIUM 8.6* 8.5* 8.7*   ------------------------------------------------------------------------------------------------------------------ No results for input(s): CHOL, HDL, LDLCALC, TRIG, CHOLHDL, LDLDIRECT in the last 72 hours.  No results found for: HGBA1C ------------------------------------------------------------------------------------------------------------------ No results for input(s): TSH, T4TOTAL, T3FREE, THYROIDAB in the last 72 hours.  Invalid input(s): FREET3 ------------------------------------------------------------------------------------------------------------------ No results for input(s): VITAMINB12, FOLATE, FERRITIN, TIBC, IRON, RETICCTPCT in the last 72 hours.  Coagulation profile No results for input(s): INR, PROTIME in the last 168 hours.  No results for input(s): DDIMER in the last 72 hours.  Cardiac Enzymes No results for input(s): CKMB, TROPONINI, MYOGLOBIN in the last 168 hours.  Invalid input(s): CK ------------------------------------------------------------------------------------------------------------------ No results found for: BNP   Roxan Hockey M.D on 03/12/2017 at 2:19 PM  Between 7am to 7pm - Pager - [phone]  After 7pm go to [email or web address] - password [email or web address]  [facility] -  Office  [phone]   Voice Recognition Viviann Spare dictation system was used to create this note, attempts have been made to correct errors. Please contact the  author with questions and/or clarifications.

## 2017-03-12 NOTE — Consult Note (Signed)
            [facility] CM Primary Care Navigator  03/12/2017  [name] 11-Mar-1941 [id number]    Went to see patientat the bedsideto identify possible discharge needs but she was transferred to [facility] yesterday per staff report.  Patient had IRRIGATION AND DEBRIDEMENT of RIGHT KNEE PRE-PATELLAR BURSA. Per MD note on 03/11/17, Ortho to manage- (s/p I and D)- ortho would like patient to go to [facility] for possible surgery tomorrow. Staff reports that patient will not be coming back to this unit.  For additional questions please contact:  Edwena Felty A. Joey Hudock, BSN, RN-B [facility] PRIMARY CARE Navigator Cell: [phone]

## 2017-03-12 NOTE — Progress Notes (Signed)
[place] will provide [facility] services and [facility] services If patient discharges after hours, please call [phone].   Larry Sierras 03/12/2017, 3:19 PM

## 2017-03-12 NOTE — Progress Notes (Signed)
Peripherally Inserted Central Catheter/Midline Placement  The IV Nurse has discussed with the patient and/or persons authorized to consent for the patient, the purpose of this procedure and the potential benefits and risks involved with this procedure.  The benefits include less needle sticks, lab draws from the catheter, and the patient may be discharged home with the catheter. Risks include, but not limited to, infection, bleeding, blood clot (thrombus formation), and puncture of an artery; nerve damage and irregular heartbeat and possibility to perform a PICC exchange if needed/ordered by physician.  Alternatives to this procedure were also discussed.  Bard Power PICC patient education guide, fact sheet on infection prevention and patient information card has been provided to patient /or left at bedside.    PICC/Midline Placement Documentation  PICC Single Lumen 03/12/17 PICC Right Brachial 41 cm 1 cm (Active)  Indication for Insertion or Continuance of Line Home intravenous therapies (PICC only) 03/12/2017  4:50 PM  Exposed Catheter (cm) 1 cm 03/12/2017  4:50 PM  Site Assessment Clean;Dry;Intact 03/12/2017  4:50 PM  Line Status Flushed;Saline locked;Blood return noted 03/12/2017  4:50 PM  Dressing Type Transparent 03/12/2017  4:50 PM  Dressing Status Clean;Dry;Intact;Antimicrobial disc in place 03/12/2017  4:50 PM  Dressing Change Due 03/19/17 03/12/2017  4:50 PM       Leafy Motsinger, Nicolette Bang 03/12/2017, 4:52 PM

## 2017-03-12 NOTE — Progress Notes (Signed)
Subjective: Patient states her knee is feeling better. Pre-op pain and swelling is better. Never felt systemically ill   Objective: Vital signs in last 24 hours: Temp:  [97.7 F (36.5 C)-98.8 F (37.1 C)] 97.8 F (36.6 C) (01/02 0548) Pulse Rate:  [81-85] 81 (01/02 0548) Resp:  [16-19] 16 (01/02 0548) BP: (125-149)/(70-75) 131/75 (01/02 0548) SpO2:  [93 %-99 %] 93 % (01/02 0548)  Intake/Output from previous day: 01/01 0701 - 01/02 0700 In: 1350 [P.O.:1200; IV Piggyback:150] Out: 1750 [Urine:1700; Drains:50] Intake/Output this shift: No intake/output data recorded.  Recent Labs    03/09/17 1538 03/10/17 0336 03/11/17 0336 03/12/17 0620  HGB 12.1 11.5* 10.7* 11.2*   Recent Labs    03/11/17 0336 03/12/17 0620  WBC 14.0* 10.6*  RBC 4.12 4.22  HCT 34.4* 35.3*  PLT 325 313   Recent Labs    03/10/17 0336 03/12/17 0620  NA 134* 140  K 3.9 4.7  CL 103 106  CO2 20* 26  BUN 25* 31*  CREATININE 1.20* 1.08*  GLUCOSE 276* 153*  CALCIUM 8.5* 8.7*   No results for input(s): LABPT, INR in the last 72 hours.  Knee shows no effusion No drainage, warmth or erythema Able to do straight leg raise with 15 degree extensor lag which is the same status her knee has been in for several years Cultures remain negative but she was on doxy prior to admission WBC decreasing Drain removed today  Assessment/Plan: Right knee infection- The patient is very well known to me and has a complex history with severe RA and multiple replaced joints. This infection occurred after a fall with an abrasion. I reviewed her findings with Dr, Tonita Cong who operated her in my absence.Her joint is pain free and the knee has no effusion or warmth. Her extensor mechanism has been tenuous for years and any further surgery may destroy her ability to have any quad control at all, thus necessitating fusion or amputation. I would be in favor of 6 weeks IV antibiotics and observation and as long as she does not have a  recurrence, then we can avoid further surgery. If it flares again she may need a resection arthroplasty with spacer and 2 stage revision vs fusion (or amputation), but I doubt we would be able to get a functional knee or good outcome if it comes to that. Will also get ID consult for antibiotic recommenndations   Gaynelle Arabian 03/12/2017, 7:30 AM

## 2017-03-12 NOTE — Consult Note (Addendum)
[facility]  Total days of antibiotics 4        Day 4 vancomycin               Reason for Consult: pre-patellar septic bursitis   Referring Physician: aluisio Principal Problem:   Arthritis, septic, knee ([place]) Active Problems:   Asthma   GERD   Rheumatoid arthritis ([place])   DIARRHEA, CHRONIC    HPI: DALIANA LEVERETT is a 77 y.o. female  With prior history of RA, severe rheumatoid arthritis - previously treated with multiple therapies since 1986 including methotrexate, Plaquenil, Arava, Remicade, prednisone, leflunomide, orencia, humira, enbrel,  Rituxan from 2007-2017, now on chronic prednisone at 5 mg and follows with Dr. Neldon Mc, Immunology and Rheumatology at [facility]. Her rheumatoid arthritis is not felt to be active at this point. She has history of bilateral TKA, but has had repeated surgeries to her right knee. She lives by herself and sustained ground level fall on 12/22 where she noticed a cut as well as clear fluid that drained from the injured area. Over the course of the next two days her knee became increasing red as well as painful with weight bearing.On the day of admit, on 12/30, she felt like she had chills. She was started on doxycycline by her pcp but still had no significant improvement.In the emergency room, she was found to have elevated white count of 15K with left shift, and x-rays indicated old fracture of the patella, large prepatellar effusion.  She was admitted on 12/30 where Dr Tonita Cong performed I x D of the affected area for management of the infection, pre-patellar septic arthritis. She was started empirically on vancomycin. Her OR cultures are showing multiple gram positive organisms, mostly CoNS. First CoNS isolate is pansensitive but R tetracyline. She would like to go home instead of rehab to receive abtx.  Past Medical History:  Diagnosis Date  . Asthma   . Complication of anesthesia    Anesthesia told her years ago she got too cold during  surgery  . Diabetes mellitus without complication ([place])   . Emphysema   . Fibromyalgia   . Osteoporosis   . Rheumatoid arthritis(714.0)   . Sleep apnea    no cpap    Allergies:  Allergies  Allergen Reactions  . Penicillins Hives    Has patient had a PCN reaction causing immediate rash, facial/tongue/throat swelling, SOB or lightheadedness with hypotension: Yes Has patient had a PCN reaction causing severe rash involving mucus membranes or skin necrosis: No Has patient had a PCN reaction that required hospitalization: No Has patient had a PCN reaction occurring within the last 10 years: No If all of the above answers are "NO", then may proceed with Cephalosporin use.  . Sulfonamide Derivatives Nausea And Vomiting    REACTION: stomach problems    MEDICATIONS: . atorvastatin  40 mg Oral QHS  . DULoxetine  120 mg Oral QHS  . ezetimibe  10 mg Oral QHS  . gabapentin  100 mg Oral QHS  . heparin injection (subcutaneous)  5,000 Units Subcutaneous Q8H  . insulin aspart  0-9 Units Subcutaneous TID WC  . loratadine  10 mg Oral QHS  . pantoprazole  40 mg Oral Daily  . predniSONE  5 mg Oral QHS  . sodium chloride flush  3 mL Intravenous Q12H    Social History   Tobacco Use  . Smoking status: Former Smoker    Last attempt to quit: 03/11/1994  Years since quitting: 23.0  . Smokeless tobacco: Never Used  Substance Use Topics  . Alcohol use: No  . Drug use: No    Family History  Problem Relation Age of Onset  . Hypertension Unknown   . Diabetes Unknown   . Stroke Unknown   . Diabetes Mother   . Diabetes Father   . Diabetes Sister   . Diabetes Maternal Aunt   . Cancer Paternal Uncle   . Cancer Paternal Grandmother   . Breast cancer Paternal Grandmother     Review of Systems  Constitutional: Negative for fever, chills, diaphoresis, activity change, appetite change, fatigue and unexpected weight change.  HENT: Negative for congestion, sore throat, rhinorrhea, sneezing,  trouble swallowing and sinus pressure.  Eyes: Negative for photophobia and visual disturbance.  Respiratory: Negative for cough, chest tightness, shortness of breath, wheezing and stridor.  Cardiovascular: Negative for chest pain, palpitations and leg swelling.  Gastrointestinal: Negative for nausea, vomiting, abdominal pain, diarrhea, constipation, blood in stool, abdominal distention and anal bleeding.  Genitourinary: Negative for dysuria, hematuria, flank pain and difficulty urinating.  Musculoskeletal: right knee pain. Negative for myalgias, back pain, joint swelling,  and gait problem.  Skin: Negative for color change, pallor, rash and wound.  Neurological: Negative for dizziness, tremors, weakness and light-headedness.  Hematological: Negative for adenopathy. Does not bruise/bleed easily.  Psychiatric/Behavioral: Negative for behavioral problems, confusion, sleep disturbance, dysphoric mood, decreased concentration and agitation.     OBJECTIVE: Temp:  [97.7 F (36.5 C)-98.8 F (37.1 C)] 97.8 F (36.6 C) (01/02 0548) Pulse Rate:  [81-85] 81 (01/02 0548) Resp:  [16-19] 16 (01/02 0548) BP: (125-149)/(70-75) 131/75 (01/02 0548) SpO2:  [93 %-99 %] 93 % (01/02 0548) Physical Exam  Constitutional:  oriented to person, place, and time. appears well-developed and well-nourished. No distress.  HENT: [place]/AT, PERRLA, no scleral icterus Mouth/Throat: Oropharynx is clear and moist. No oropharyngeal exudate.  Cardiovascular: Normal rate, regular rhythm and normal heart sounds. Exam reveals no gallop and no friction rub.  No murmur heard.  Pulmonary/Chest: Effort normal and breath sounds normal. No respiratory distress.  has no wheezes.  Neck = supple, no nuchal rigidity Abdominal: Soft. Bowel sounds are normal.  exhibits no distension. There is no tenderness.  Lymphadenopathy: no cervical adenopathy. No axillary adenopathy Ext: right knee has bandage with dried yellowish serous drainage. No  erythema or swelling. Marked deformity to fingers from RA bilaterally Neurological: alert and oriented to person, place, and time.  Skin: Skin is warm and dry. No rash noted. No erythema.  Psychiatric: a normal mood and affect.  behavior is normal.    LABS: Results for orders placed or performed during the hospital encounter of 03/09/17 (from the past 48 hour(s))  Glucose, capillary     Status: Abnormal   Collection Time: 03/10/17  5:17 PM  Result Value Ref Range   Glucose-Capillary 162 (H) 65 - 99 mg/dL   Comment 1 Notify RN   Glucose, capillary     Status: Abnormal   Collection Time: 03/10/17  9:30 PM  Result Value Ref Range   Glucose-Capillary 181 (H) 65 - 99 mg/dL   Comment 1 Notify RN    Comment 2 Document in Chart   CBC     Status: Abnormal   Collection Time: 03/11/17  3:36 AM  Result Value Ref Range   WBC 14.0 (H) 4.0 - 10.5 K/uL   RBC 4.12 3.87 - 5.11 MIL/uL   Hemoglobin 10.7 (L) 12.0 - 15.0 g/dL  HCT 34.4 (L) 36.0 - 46.0 %   MCV 83.5 78.0 - 100.0 fL   MCH 26.0 26.0 - 34.0 pg   MCHC 31.1 30.0 - 36.0 g/dL   RDW 16.0 (H) 11.5 - 15.5 %   Platelets 325 150 - 400 K/uL  Glucose, capillary     Status: Abnormal   Collection Time: 03/11/17  7:48 AM  Result Value Ref Range   Glucose-Capillary 154 (H) 65 - 99 mg/dL   Comment 1 Notify RN   Glucose, capillary     Status: Abnormal   Collection Time: 03/11/17 12:17 PM  Result Value Ref Range   Glucose-Capillary 134 (H) 65 - 99 mg/dL   Comment 1 Notify RN   Glucose, capillary     Status: Abnormal   Collection Time: 03/11/17  5:05 PM  Result Value Ref Range   Glucose-Capillary 111 (H) 65 - 99 mg/dL  CBC     Status: Abnormal   Collection Time: 03/12/17  6:20 AM  Result Value Ref Range   WBC 10.6 (H) 4.0 - 10.5 K/uL   RBC 4.22 3.87 - 5.11 MIL/uL   Hemoglobin 11.2 (L) 12.0 - 15.0 g/dL   HCT 35.3 (L) 36.0 - 46.0 %   MCV 83.6 78.0 - 100.0 fL   MCH 26.5 26.0 - 34.0 pg   MCHC 31.7 30.0 - 36.0 g/dL   RDW 15.8 (H) 11.5 - 15.5 %     Platelets 313 150 - 400 K/uL  Basic metabolic panel     Status: Abnormal   Collection Time: 03/12/17  6:20 AM  Result Value Ref Range   Sodium 140 135 - 145 mmol/L   Potassium 4.7 3.5 - 5.1 mmol/L   Chloride 106 101 - 111 mmol/L   CO2 26 22 - 32 mmol/L   Glucose, Bld 153 (H) 65 - 99 mg/dL   BUN 31 (H) 6 - 20 mg/dL   Creatinine, Ser 1.08 (H) 0.44 - 1.00 mg/dL   Calcium 8.7 (L) 8.9 - 10.3 mg/dL   GFR calc non Af Amer 49 (L) >60 mL/min   GFR calc Af Amer 57 (L) >60 mL/min    Comment: (NOTE) The eGFR has been calculated using the CKD EPI equation. This calculation has not been validated in all clinical situations. eGFR's persistently <60 mL/min signify possible Chronic Kidney Disease.    Anion gap 8 5 - 15  Vancomycin, trough     Status: None   Collection Time: 03/12/17  6:20 AM  Result Value Ref Range   Vancomycin Tr 17 15 - 20 ug/mL  Glucose, capillary     Status: Abnormal   Collection Time: 03/12/17  7:34 AM  Result Value Ref Range   Glucose-Capillary 138 (H) 65 - 99 mg/dL  Glucose, capillary     Status: None   Collection Time: 03/12/17 12:33 PM  Result Value Ref Range   Glucose-Capillary 85 65 - 99 mg/dL   Erythrocyte Sedimentation Rate     Component Value Date/Time   ESRSEDRATE 50 (H) 03/09/2017 1538     MICRO: Staph intermidus -- R- tetra R- PCN S- vanco S- oxa IMAGING: No results found.    Assessment/Plan:  77yo F with severe RA who has CoNS pre-patellar bursitis.  Treat for 4 weeks for pre-patellar septic bursitis. Spoke with micro who felt she has 4 Gram positive staph species. Not all identified thus far.   Recommend to treat with  vancomycin x 4 wk. I would like for her to have twice  per week home visits since she is likely to get some nephrotoxicity given her overall body mass/decrease muscle mass. If she has nephrotoxicity/increase in cr then we will have low threshold to switch to daptomycin  Will ask pharmacy to switch to once day  dosing.  Speaking with home health coordinator to see feasibility of receiving abtx at home  [facility] for her to get picc line  DM = appears to have some readings > 200. Consider checking hemoglobin A1C.   Please see home health orders listed below  Diagnosis: Septic bursitis of right knee  Culture Result: CoNS  Allergies  Allergen Reactions  . Penicillins Hives    Has patient had a PCN reaction causing immediate rash, facial/tongue/throat swelling, SOB or lightheadedness with hypotension: Yes Has patient had a PCN reaction causing severe rash involving mucus membranes or skin necrosis: No Has patient had a PCN reaction that required hospitalization: No Has patient had a PCN reaction occurring within the last 10 years: No If all of the above answers are "NO", then may proceed with Cephalosporin use.  . Sulfonamide Derivatives Nausea And Vomiting    REACTION: stomach problems    OPAT Orders Discharge antibiotics: Per pharmacy protocol : vancomycin Aim for Vancomycin trough 15-20 (unless otherwise indicated) Duration: 4 wk End Date: Jan 27th  [place] Per Protocol:  Labs weekly while on IV antibiotics: __x CBC with differential _x_ BMP twice per week __ CMP _x_ CRP _x_ ESR at every other week _x_ Vancomycin trough weekly  __ Please pull PIC at completion of IV antibiotics __x Please leave PIC in place until doctor has seen patient or been notified  Fax weekly labs to [phone]  Clinic Follow Up Appt: In 3-4 wk  @

## 2017-03-13 DIAGNOSIS — K219 Gastro-esophageal reflux disease without esophagitis: Secondary | ICD-10-CM

## 2017-03-13 DIAGNOSIS — M71161 Other infective bursitis, right knee: Secondary | ICD-10-CM

## 2017-03-13 LAB — GLUCOSE, CAPILLARY
Glucose-Capillary: 135 mg/dL — ABNORMAL HIGH (ref 65–99)
Glucose-Capillary: 115 mg/dL — ABNORMAL HIGH (ref 65–99)

## 2017-03-13 MED ORDER — OXYCODONE HCL 10 MG PO TABS: 10.0000 mg | ORAL_TABLET | Freq: Four times a day (QID) | ORAL | 0 refills | Status: DC | PRN

## 2017-03-13 MED ORDER — VANCOMYCIN HCL 10 G IV SOLR
1500.0000 mg | INTRAVENOUS | Status: DC
  Filled 2017-03-13: qty 1500

## 2017-03-13 MED ORDER — HEPARIN SOD (PORK) LOCK FLUSH 100 UNIT/ML IV SOLN
250.0000 [IU] | INTRAVENOUS | Status: AC | PRN
  Administered 2017-03-13: 250 [IU]

## 2017-03-13 MED ORDER — VANCOMYCIN IV (FOR PTA / DISCHARGE USE ONLY): 1500.0000 mg | INTRAVENOUS | 0 refills | Status: AC

## 2017-03-13 MED ORDER — ONDANSETRON HCL 4 MG PO TABS: 4.0000 mg | ORAL_TABLET | Freq: Four times a day (QID) | ORAL | 0 refills | Status: DC | PRN

## 2017-03-13 NOTE — Progress Notes (Signed)
PHARMACY CONSULT NOTE FOR:  OUTPATIENT  PARENTERAL ANTIBIOTIC THERAPY (OPAT)  Indication: Septic bursitis of right knee Regimen: Vancomycin 1500 mg IV q24h End date: 04/06/17  IV antibiotic discharge orders are pended. To discharging provider:  please sign these orders via discharge navigator,  Select New Orders & click on the button choice - Manage This Unsigned Work.     Thank you for allowing pharmacy to be a part of this patient's care.  Gretta Arab PharmD, BCPS Pager [phone] 03/13/2017 1:57 PM

## 2017-03-13 NOTE — Progress Notes (Signed)
Physical Therapy Treatment Patient Details Name: [name] MRN: [id number] DOB: 18-Jan-1941 Today's Date: 03/13/2017    History of Present Illness Pt s/p I&D of R TKR and with hx of RA, DM, fibromyalgia and multiple R knee surgery    PT Comments    Pt very independent minded and progressing steadily with mobility.   Follow Up Recommendations  Home health PT     Equipment Recommendations  Rolling walker with 5" wheels    Recommendations for Other Services OT consult     Precautions / Restrictions Precautions Precautions: Knee;Fall Required Braces or Orthoses: Knee Immobilizer - Right Knee Immobilizer - Right: On at all times Restrictions Weight Bearing Restrictions: No Other Position/Activity Restrictions: WBAT    Mobility  Bed Mobility Overal bed mobility: Needs Assistance Bed Mobility: Sit to Supine     Supine to sit: Min guard Sit to supine: Supervision   General bed mobility comments: incresaed time with VC for sequence  Transfers Overall transfer level: Needs assistance Equipment used: Rolling walker (2 wheeled) Transfers: Sit to/from Stand Sit to Stand: Min guard;Supervision         General transfer comment: cues for LE management and use of UEs to self assist.  Min guard to steady on rising and min assist to control descent  Ambulation/Gait Ambulation/Gait assistance: Min guard;Supervision Ambulation Distance (Feet): 20 Feet(twice - to/from bathroom) Assistive device: Rolling walker (2 wheeled) Gait Pattern/deviations: Step-to pattern;Decreased step length - right;Decreased step length - left;Shuffle;Trunk flexed Gait velocity: decr Gait velocity interpretation: Below normal speed for age/gender General Gait Details: min cues for sequence, posture and position from [employer]            Wheelchair Mobility    Modified Rankin (Stroke Patients Only)       Balance Overall balance assessment: Needs assistance Sitting-balance support:  No upper extremity supported;Feet supported Sitting balance-Leahy Scale: Good     Standing balance support: No upper extremity supported Standing balance-Leahy Scale: Fair                              Cognition Arousal/Alertness: Awake/alert Behavior During Therapy: WFL for tasks assessed/performed Overall Cognitive Status: Within Functional Limits for tasks assessed                                        Exercises      General Comments        Pertinent Vitals/Pain Pain Assessment: 0-10 Pain Score: 4  Pain Location: R knee Pain Descriptors / Indicators: Aching;Sore;Burning Pain Intervention(s): Limited activity within patient's tolerance;Monitored during session;Premedicated before session    Home Living Family/patient expects to be discharged to:: Private residence Living Arrangements: Alone Available Help at Discharge: Available PRN/intermittently Type of Home: Mobile home Home Access: Ramped entrance   Home Layout: One level Home Equipment: None      Prior Function Level of Independence: Independent          PT Goals (current goals can now be found in the care plan section) Acute Rehab PT Goals Patient Stated Goal: Regain IND  PT Goal Formulation: With patient Time For Goal Achievement: 03/20/17 Potential to Achieve Goals: Fair Progress towards PT goals: Progressing toward goals    Frequency    Min 5X/week      PT Plan Current plan remains appropriate  Co-evaluation              AM-PAC PT "6 Clicks" Daily Activity  Outcome Measure  Difficulty turning over in bed (including adjusting bedclothes, sheets and blankets)?: A Lot Difficulty moving from lying on back to sitting on the side of the bed? : A Lot Difficulty sitting down on and standing up from a chair with arms (e.g., wheelchair, bedside commode, etc,.)?: A Little Help needed moving to and from a bed to chair (including a wheelchair)?: A Little Help  needed walking in hospital room?: A Little Help needed climbing 3-5 steps with a railing? : A Lot 6 Click Score: 15    End of Session Equipment Utilized During Treatment: Gait belt;Right knee immobilizer Activity Tolerance: Patient tolerated treatment well Patient left: in bed;with call bell/phone within reach;with family/visitor present Nurse Communication: Mobility status PT Visit Diagnosis: Unsteadiness on feet (R26.81);Difficulty in walking, not elsewhere classified (R26.2);Pain Pain - Right/Left: Right Pain - part of body: Knee     Time: 1403-1430 PT Time Calculation (min) (ACUTE ONLY): 27 min  Charges:  $Gait Training: 8-22 mins $Therapeutic Activity: 8-22 mins                    G Codes:       Pg [phone]    Analis Distler 03/13/2017, 2:39 PM

## 2017-03-13 NOTE — Care Management Important Message (Signed)
Important Message  Patient Details  Name: [name] MRN: [id number] Date of Birth: November 15, 1940   Medicare Important Message Given:  Yes    Kerin Salen 03/13/2017, 10:09 AMImportant Message  Patient Details  Name: [name] MRN: [id number] Date of Birth: Jul 13, 1940   Medicare Important Message Given:  Yes    Kerin Salen 03/13/2017, 10:09 AM

## 2017-03-13 NOTE — Discharge Summary (Signed)
[name], is a 77 y.o. female  DOB 08-07-1940  MRN [id number].  Admission date:  03/09/2017  Admitting Physician  Phillips Grout, MD  Discharge Date:  03/13/2017   Primary MD  [name], MD  Recommendations for primary care physician for things to follow:   Admission Diagnosis  Septic prepatellar bursitis of right knee [M71.161]   Discharge Diagnosis  Septic prepatellar bursitis of right knee [M71.161]    Principal Problem:   Arthritis, septic, knee ([place]) Active Problems:   Asthma   GERD   Rheumatoid arthritis ([place])   DIARRHEA, CHRONIC      Past Medical History:  Diagnosis Date  . Asthma   . Complication of anesthesia    Anesthesia told her years ago she got too cold during surgery  . Diabetes mellitus without complication ([place])   . Emphysema   . Fibromyalgia   . Osteoporosis   . Rheumatoid arthritis(714.0)   . Sleep apnea    no cpap    Past Surgical History:  Procedure Laterality Date  . ABDOMINAL HYSTERECTOMY    . ANKLE FRACTURE SURGERY  2007  . BREAST SURGERY     mammoplasty and then removed  . FOOT SURGERY     numerous  . I&D EXTREMITY Right 10/26/2014   Procedure: IRRIGATION AND DEBRIDEMENT RIGHT KNEE ;  Surgeon: Gaynelle Arabian, MD;  Location: [facility];  Service: Orthopedics;  Laterality: Right;  . IRRIGATION AND DEBRIDEMENT KNEE Right 03/09/2017   Procedure: IRRIGATION AND DEBRIDEMENT RIGHT KNEE PRE-PATELLAR BURSA;  Surgeon: Susa Day, MD;  Location: [facility];  Service: Orthopedics;  Laterality: Right;  . KNEE BURSECTOMY Right 09/16/2014   Procedure: EXCISON OF PRE PATELLA BURSA RIGHT KNEE ;  Surgeon: Gaynelle Arabian, MD;  Location: [facility];  Service: Orthopedics;  Laterality: Right;  . PARTIAL HYSTERECTOMY  1970  . TOTAL KNEE ARTHROPLASTY  2005 / 2006   x2       HPI  from the history and physical done on the day of admission:    Patient coming from:  home  Chief  Complaint:  Right knee swelling  HPI: [name] is a 77 y.o. female with medical history significant of septic right knee in mid 2016 (culture data reviewed and all no growth) comes in with several days of redness, swelling and warmth to right knee. Pt reports she fell a week ago and had a scratch above the knee and it has gotten infected.  She saw her doctor and was started on doxycycline 2 days ago and it still has progressively gotten more red over the knee and swollen.  There is no drainage from the area.  Ortho called and is taking to the OR now for washout.  vanc has been ordered.  Pt referred for admission for septic knee.    Hospital Course:   Brief Summary:- [name] a 77 y.o.femalewith medical history significant for RA and Prior Rt TRA and prior h/oseptic right knee in mid 2016 (culture data reviewed and all  no growth) admitted on 03/09/17 with Rt Knee redness, swelling and warmth  after a fall and scratch. She failed Doxy as outpt. Dxed with Pre-Patella Bursitis and started on Vancomycin on 03/09/17  Plan: 1)Rt Knee prepatellar bursitis-no fevers, WBC is down to 10.6 from 15.0, OR wound cultures from 03/09/2017 with gram-positive cocci/Coag Neg Staph, d/w Dr Graylon Good from ID, she recommends IV vancomycin 1.5 gm daily via Picc Line for  4 weeks. Prior to admission patient was on doxycycline.  Orthopedic consult from Dr. Maureen Ralphs appreciated.  Treat for 4 weeks for pre-patellar septic bursitis,CBC and BMP q Monday, F/u with Dr. Maureen Ralphs   2)Asthma-stable , no acute flareup at this time, continue prednisone 5 mg daily and nebulizers  3)Rheumatoid Arthritis-with chronic deformities, continue prednisone 5 mg daily  4)Disposition-PT eval appreciated, social work consult appreciated, pharmacy consult appreciated, appreciated, orthopedic consult appreciated, at this time patient declines transfer to rehab facility.  Patient is aware that she is at risk for falling at home, patient  adamantly refuses to go to skilled nursing facility for rehab and IV anti-biotic, Pt wants to go home with home health services, patient will  be discharged with PICC line and IV antibiotics for 4 weeks.    Please note that the patient lives alone and she has muscular deformity secondary to severe rheumatoid arthritis.    Discharge Condition: Stable from a medical standpoint  Follow UP- Dr Maureen Ralphs    Consults obtained - see above  Diet and Activity recommendation:  As advised  Discharge Instructions     Discharge Instructions    Call MD for:  difficulty breathing, headache or visual disturbances   Complete by:  As directed    Call MD for:  persistant dizziness or light-headedness   Complete by:  As directed    Call MD for:  persistant nausea and vomiting   Complete by:  As directed    Call MD for:  temperature >100.4   Complete by:  As directed    Diet - low sodium heart healthy   Complete by:  As directed    Discharge instructions   Complete by:  As directed    Daily IV vancomycin for home infusion for 4 weeks CBC and BMP every Monday Follow with Dr Maureen Ralphs for Right knee Staph infection with  prepatellar bursitis superimposed on severe debilitating multi-joint rheumatoid arthritis   Home infusion instructions [facility] May follow [place] Dosing Protocol; May administer Cathflo as needed to maintain patency of vascular access device.; Flushing of vascular access device: per [facility] Protocol: 0.9% NaCl pre/post medica...   Complete by:  As directed    Instructions:  May follow [place] Dosing Protocol   Instructions:  May administer Cathflo as needed to maintain patency of vascular access device.   Instructions:  Flushing of vascular access device: per [facility] Protocol: 0.9% NaCl pre/post medication administration and prn patency; Heparin 100 u/ml, 16m for implanted ports and Heparin 10u/ml, 573mfor all other central venous catheters.   Instructions:  May follow [facility]  Anaphylaxis Protocol for First Dose Administration in the home: 0.9% NaCl at 25-50 ml/hr to maintain IV access for protocol meds. Epinephrine 0.3 ml IV/IM PRN and Benadryl 25-50 IV/IM PRN s/s of anaphylaxis.   Instructions:  [facility] Coord[facility] (RN) to assist per patient IV care needs in the home PRN.   Increase activity slowly   Complete by:  As directed        Discharge Medications  Allergies as of 03/13/2017      Reactions   Penicillins Hives   Has patient had a PCN reaction causing immediate rash, facial/tongue/throat swelling, SOB or lightheadedness with hypotension: Yes Has patient had a PCN reaction causing severe rash involving mucus membranes or skin necrosis: No Has patient had a PCN reaction that required hospitalization: No Has patient had a PCN reaction occurring within the last 10 years: No If all of the above answers are "NO", then may proceed with Cephalosporin use.   Sulfonamide Derivatives Nausea And Vomiting   REACTION: stomach problems      Medication List    TAKE these medications   Albuterol Sulfate 108 (90 Base) MCG/ACT Aepb Commonly known as:  PROAIR RESPICLICK Inhale 2 puffs into the lungs as needed (every four to six hours for cough or wheeze). What changed:    when to take this  reasons to take this   aspirin EC 81 MG tablet Take 81 mg by mouth 2 (two) times a week.   atorvastatin 40 MG tablet Commonly known as:  LIPITOR Take 40 mg by mouth at bedtime.   B-12 2500 MCG Tabs Take 2,500 mcg by mouth at bedtime.   beclomethasone 80 MCG/ACT inhaler Commonly known as:  QVAR Inhale 2 puffs into the lungs 2 (two) times daily. What changed:    when to take this  reasons to take this   cholecalciferol 1000 units tablet Commonly known as:  VITAMIN D Take 1,000 Units by mouth at bedtime.   doxycycline 100 MG tablet Commonly known as:  VIBRA-TABS Take 100 mg by mouth 2 (two) times daily. 10 day course started 03/06/17 pm     DULoxetine 60 MG capsule Commonly known as:  CYMBALTA Take 120 mg by mouth at bedtime.   ezetimibe 10 MG tablet Commonly known as:  ZETIA Take 10 mg by mouth at bedtime.   Fish Oil 1200 MG Caps Take 1,200 mg by mouth at bedtime.   fluticasone furoate-vilanterol 200-25 MCG/INH Aepb Commonly known as:  BREO ELLIPTA Inhale 1 puff into the lungs daily. What changed:    when to take this  reasons to take this   gabapentin 100 MG capsule Commonly known as:  NEURONTIN Take 100 mg by mouth at bedtime.   linagliptin 5 MG Tabs tablet Commonly known as:  TRADJENTA Take 5 mg by mouth at bedtime.   loratadine 10 MG tablet Commonly known as:  CLARITIN Take 10 mg by mouth at bedtime.   metFORMIN 500 MG tablet Commonly known as:  GLUCOPHAGE Take 1,000 mg by mouth at bedtime.   montelukast 10 MG tablet Commonly known as:  SINGULAIR TAKE ONE TABLET BY MOUTH ONCE DAILY What changed:    how much to take  how to take this  when to take this   morphine 30 MG tablet Commonly known as:  MSIR Take 30 mg by mouth daily as needed for severe pain.   multivitamin with minerals Tabs tablet Take 1 tablet by mouth at bedtime.   ondansetron 4 MG tablet Commonly known as:  ZOFRAN Take 1 tablet (4 mg total) by mouth every 6 (six) hours as needed for nausea.   Oxycodone HCl 10 MG Tabs Take 1 tablet (10 mg total) by mouth every 6 (six) hours as needed for severe pain or breakthrough pain.   pantoprazole 40 MG tablet Commonly known as:  PROTONIX Take one tablet once every morning What changed:    how much to take  how to  take this  when to take this  additional instructions   predniSONE 5 MG tablet Commonly known as:  DELTASONE Take 5 mg by mouth at bedtime. For rheumatoid arthritis   ranitidine 150 MG tablet Commonly known as:  ZANTAC TAKE TWO TABLETS EVERY EVENING What changed:    how much to take  how to take this  when to take this  additional instructions    vancomycin IVPB Inject 1,500 mg into the vein daily for 24 days. Indication:  Septic bursitis of right knee Last Day of Therapy:  04/06/17  Labs - Monday:  CBC/D, BMP, CRP, and vancomycin trough. Labs - Thursday:  BMP Labs - Every other week:  ESR            Home Infusion Instuctions  (From admission, onward)        Start     Ordered   03/13/17 0000  Home infusion instructions [facility] May follow [place] Dosing Protocol; May administer Cathflo as needed to maintain patency of vascular access device.; Flushing of vascular access device: per [facility] Protocol: 0.9% NaCl pre/post medica...    Question Answer Comment  Instructions May follow [place] Dosing Protocol   Instructions May administer Cathflo as needed to maintain patency of vascular access device.   Instructions Flushing of vascular access device: per [facility] Protocol: 0.9% NaCl pre/post medication administration and prn patency; Heparin 100 u/ml, 67m for implanted ports and Heparin 10u/ml, 529mfor all other central venous catheters.   Instructions May follow [facility] Anaphylaxis Protocol for First Dose Administration in the home: 0.9% NaCl at 25-50 ml/hr to maintain IV access for protocol meds. Epinephrine 0.3 ml IV/IM PRN and Benadryl 25-50 IV/IM PRN s/s of anaphylaxis.   Instructions [facility] Infusion Coordinator (RN) to assist per patient IV care needs in the home PRN.      03/13/17 1504       Durable Medical Equipment  (From admission, onward)        Start     Ordered   03/13/17 1444  For home use only DME Walker rolling  Once    Question:  Patient needs a walker to treat with the following condition  Answer:  General body deterioration   03/13/17 1443      Major procedures and Radiology Reports - PLEASE review detailed and final reports for all details, in brief -   Dg Chest Portable 1 View  Result Date: 03/09/2017 CLINICAL DATA:  Preoperative for wound infection EXAM: PORTABLE CHEST 1  VIEW COMPARISON:  September 13, 2016 FINDINGS: The heart size and mediastinal contours are within normal limits. The aorta is tortuous. Both lungs are clear. The visualized skeletal structures are unremarkable. IMPRESSION: No active cardiopulmonary disease. Electronically Signed   By: WeAbelardo Diesel.D.   On: 03/09/2017 19:31   Dg Knee Complete 4 Views Right  Result Date: 03/09/2017 CLINICAL DATA:  Status post fall 8 days ago with right knee pain EXAM: RIGHT KNEE - COMPLETE 4+ VIEW COMPARISON:  None. FINDINGS: No evidence of fracture, dislocation. 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 There is marked soft tissue swelling in the anterior knee. Soft tissues are unremarkable. IMPRESSION: No acute fracture. Marked soft tissue swelling of the anterior right knee. Electronically Signed   By: WeAbelardo Diesel.D.   On: 03/09/2017 16:26    Micro Results   Recent Results (from the past 240 hour(s))  Fungus Culture With Stain     Status: None (Preliminary result)  Collection Time: 03/09/17  8:22 PM  Result Value Ref Range Status   Fungus Stain Final report  Final    Comment: (NOTE) Performed At: [facility] [place], [place] [id number] Rush Farmer MD [id number]    Fungus (Mycology) Culture PENDING  Incomplete   Fungal Source RIGHT  Final    Comment: KNEE  Aerobic/Anaerobic Culture (surgical/deep wound)     Status: None (Preliminary result)   Collection Time: 03/09/17  8:22 PM  Result Value Ref Range Status   Specimen Description WOUND RIGHT KNEE  Final   Special Requests NONE  Final   Gram Stain   Final    FEW WBC PRESENT, PREDOMINANTLY PMN NO ORGANISMS SEEN    Culture   Final    RARE STAPHYLOCOCCUS SPECIES (COAGULASE NEGATIVE) CULTURE REINCUBATED FOR BETTER GROWTH    Report Status PENDING  Incomplete   Organism ID, Bacteria STAPHYLOCOCCUS SPECIES (COAGULASE NEGATIVE)  Final      Susceptibility   Staphylococcus species (coagulase negative) - MIC*    CIPROFLOXACIN  <=0.5 SENSITIVE Sensitive     ERYTHROMYCIN 0.5 SENSITIVE Sensitive     GENTAMICIN <=0.5 SENSITIVE Sensitive     OXACILLIN <=0.25 SENSITIVE Sensitive     TETRACYCLINE >=16 RESISTANT Resistant     VANCOMYCIN <=0.5 SENSITIVE Sensitive     TRIMETH/SULFA <=10 SENSITIVE Sensitive     CLINDAMYCIN <=0.25 SENSITIVE Sensitive     RIFAMPIN <=0.5 SENSITIVE Sensitive     Inducible Clindamycin NEGATIVE Sensitive     * RARE STAPHYLOCOCCUS SPECIES (COAGULASE NEGATIVE)  Fungus Culture Result     Status: None   Collection Time: 03/09/17  8:22 PM  Result Value Ref Range Status   Result 1 Comment  Final    Comment: (NOTE) KOH/Calcofluor preparation:  no fungus observed. Performed At: [facility] [place], [place] [id number] Rush Farmer MD [id number]        Today   Subjective    [name] today has no new complaints, no f/c, no n/v d, eager to go home , female friend at bedside, RN Nira Conn and SW [place] [facility] at bedside, questions answered          Patient has been seen and examined prior to discharge   Objective   Blood pressure 120/67, pulse 89, temperature 98 F (36.7 C), temperature source Oral, resp. rate 18, height 5' 5"  (1.651 m), weight 78.7 kg (173 lb 8 oz), SpO2 97 %.   Intake/Output Summary (Last 24 hours) at 03/13/2017 1522 Last data filed at 03/13/2017 1443 Gross per 24 hour  Intake 1200 ml  Output 750 ml  Net 450 ml    Exam Gen:- Awake  In no apparent distress  HEENT:- [place].AT,   Neck-Supple Neck,No JVD,  Lungs- mostly clear  CV- S1, S2 normal Abd-  +ve B.Sounds, Abd Soft, No tenderness,    Extremity/Skin:- Intact peripheral pulses  Rt Knee dressing/brace on ,  MSK-significant ulnar deviation of both upper extremities.  Rheumatoid arthritis related deformities of both upper extremities Psych-appropriate affect Neuro-no tremors, generalized weakness without new focal deficits   Data Review   CBC w Diff:  Lab Results  Component Value Date    WBC 10.6 (H) 03/12/2017   HGB 11.2 (L) 03/12/2017   HGB 13.3 08/30/2015   HCT 35.3 (L) 03/12/2017   HCT 41.2 08/30/2015   PLT 313 03/12/2017   PLT 379 08/30/2015   LYMPHOPCT 8 03/09/2017   MONOPCT 10 03/09/2017   EOSPCT 0 03/09/2017  BASOPCT 0 03/09/2017    CMP:  Lab Results  Component Value Date   NA 140 03/12/2017   K 4.7 03/12/2017   CL 106 03/12/2017   CO2 26 03/12/2017   BUN 31 (H) 03/12/2017   CREATININE 1.08 (H) 03/12/2017   PROT 7.0 09/16/2014   ALBUMIN 3.9 09/16/2014   BILITOT 1.3 (H) 09/16/2014   ALKPHOS 80 09/16/2014   AST 43 (H) 09/16/2014   ALT 49 09/16/2014  .   Total Discharge time is about 33 minutes  Roxan Hockey M.D on 03/13/2017 at 3:22 PM  [facility]   Office  [phone]  Voice Recognition Viviann Spare dictation system was used to create this note, attempts have been made to correct errors. Please contact the author with questions and/or clarifications.

## 2017-03-13 NOTE — Discharge Instructions (Signed)
Daily IV vancomycin for home infusion for 4 weeks CBC and BMP every Monday Follow with Dr Maureen Ralphs for Right knee Staph infection with  prepatellar bursitis superimposed on severe debilitating multi-joint rheumatoid arthritis

## 2017-03-13 NOTE — Progress Notes (Signed)
Pharmacy Antibiotic Note  [name] is a 77 y.o. female admitted on 03/09/2017 with PMH of septic right knee in 2016, failed doxycyline as an outptient, and now admitted with septic arthritis s/p I&D on 12/30.  Ortho and ID recommend to continue IV abx, ID requests once daily vancomycin dosing per pharmacy.  Plan:  Change to Vancomycin 1500 mg IV q24h.  Measure Vanc trough/peak as needed.  Follow up renal fxn, culture results, and clinical course.  MD to enter OPAT consult for pharmacy if needed.  Height: 5\' 5"  (165.1 cm) Weight: 173 lb 8 oz (78.7 kg) IBW/kg (Calculated) : 57  Temp (24hrs), Avg:97.9 F (36.6 C), Min:97.5 F (36.4 C), Max:98.6 F (37 C)  Recent Labs  Lab 03/09/17 1538 03/09/17 1551 03/10/17 0336 03/11/17 0336 03/12/17 0620  WBC 15.0*  --  10.7* 14.0* 10.6*  CREATININE 1.10*  --  1.20*  --  1.08*  LATICACIDVEN  --  1.18  --   --   --   VANCOTROUGH  --   --   --   --  17    Estimated Creatinine Clearance: 46.7 mL/min (A) (by C-G formula based on SCr of 1.08 mg/dL (H)).    Allergies  Allergen Reactions  . Penicillins Hives    Has patient had a PCN reaction causing immediate rash, facial/tongue/throat swelling, SOB or lightheadedness with hypotension: Yes Has patient had a PCN reaction causing severe rash involving mucus membranes or skin necrosis: No Has patient had a PCN reaction that required hospitalization: No Has patient had a PCN reaction occurring within the last 10 years: No If all of the above answers are "NO", then may proceed with Cephalosporin use.  . Sulfonamide Derivatives Nausea And Vomiting    REACTION: stomach problems    Antimicrobials this admission: 12/30 >> Vancomycin >>   Dose adjustments this admission: 03/12/17 0630 VT = 17  Microbiology results: 12/30: R knee wound: rare staph species (pan-sens except tetracycline) 12/30: fungal: none observed  Thank you for allowing pharmacy to be a part of this patient's  care.  Gretta Arab PharmD, BCPS Pager [phone] 03/13/2017 12:15 PM

## 2017-03-13 NOTE — Evaluation (Signed)
Physical Therapy Evaluation Patient Details Name: [name] MRN: [id number] DOB: [birth year] Today's Date: 03/13/2017   History of Present Illness  Pt s/p I&D of R TKR and with hx of RA, DM, fibromyalgia and multiple R knee surgery  Clinical Impression  Pt admitted as above and presenting with functional mobility limitations 2* decreased strength/ROM R LE and post op pain.  Pt also limited 2* RA related deficits in hands and may struggle to manage KI unassisted.  Pt hopes to progress to dc home with intermittent assist of family .    Follow Up Recommendations Home health PT    Equipment Recommendations  Rolling walker with 5" wheels    Recommendations for Other Services OT consult     Precautions / Restrictions Precautions Precautions: Knee;Fall Required Braces or Orthoses: Knee Immobilizer - Right Knee Immobilizer - Right: On at all times Restrictions Weight Bearing Restrictions: No Other Position/Activity Restrictions: WBAT      Mobility  Bed Mobility Overal bed mobility: Needs Assistance Bed Mobility: Supine to Sit     Supine to sit: Min guard     General bed mobility comments: incresaed time with VC for sequence  Transfers Overall transfer level: Needs assistance Equipment used: Rolling walker (2 wheeled) Transfers: Sit to/from Stand Sit to Stand: Min assist;Min guard         General transfer comment: cues for LE management and use of UEs to self assist.  Min guard to steady on rising and min assist to control descent  Ambulation/Gait Ambulation/Gait assistance: Min assist;Min guard Ambulation Distance (Feet): 35 Feet Assistive device: Rolling walker (2 wheeled) Gait Pattern/deviations: Step-to pattern;Decreased step length - right;Decreased step length - left;Shuffle;Trunk flexed Gait velocity: decr Gait velocity interpretation: Below normal speed for age/gender General Gait Details: cues for sequence, posture and position from [employer]            Wheelchair Mobility    Modified Rankin (Stroke Patients Only)       Balance Overall balance assessment: Needs assistance Sitting-balance support: No upper extremity supported;Feet supported Sitting balance-Leahy Scale: Good     Standing balance support: No upper extremity supported Standing balance-Leahy Scale: Fair                               Pertinent Vitals/Pain Pain Assessment: 0-10 Pain Score: 4  Pain Location: R knee Pain Descriptors / Indicators: Aching;Sore Pain Intervention(s): Limited activity within patient's tolerance;Monitored during session;Premedicated before session;Ice applied    Home Living Family/patient expects to be discharged to:: Private residence Living Arrangements: Alone Available Help at Discharge: Available PRN/intermittently Type of Home: Mobile home Home Access: Ramped entrance     Home Layout: One level Home Equipment: None      Prior Function Level of Independence: Independent               Hand Dominance        Extremity/Trunk Assessment   Upper Extremity Assessment Upper Extremity Assessment: RUE deficits/detail;LUE deficits/detail RUE Deficits / Details: Limited use of hands 2* RA changes LUE Deficits / Details: Limited use of hands 2* RA changes    Lower Extremity Assessment Lower Extremity Assessment: RLE deficits/detail RLE Deficits / Details: KI in place at all times.      Cervical / Trunk Assessment Cervical / Trunk Assessment: Kyphotic  Communication   Communication: No difficulties  Cognition Arousal/Alertness: Awake/alert Behavior During Therapy: WFL for tasks assessed/performed Overall Cognitive Status:  Within Functional Limits for tasks assessed                                        General Comments      Exercises     Assessment/Plan    PT Assessment Patient needs continued PT services  PT Problem List Decreased strength;Decreased range of motion;Decreased  activity tolerance;Decreased mobility;Decreased balance;Decreased knowledge of use of DME;Pain       PT Treatment Interventions DME instruction;Gait training;Functional mobility training;Therapeutic activities;Therapeutic exercise;Patient/family education    PT Goals (Current goals can be found in the Care Plan section)  Acute Rehab PT Goals Patient Stated Goal: Regain IND  PT Goal Formulation: With patient Time For Goal Achievement: 03/20/17 Potential to Achieve Goals: Fair    Frequency Min 5X/week   Barriers to discharge        Co-evaluation               AM-PAC PT "6 Clicks" Daily Activity  Outcome Measure Difficulty turning over in bed (including adjusting bedclothes, sheets and blankets)?: A Lot Difficulty moving from lying on back to sitting on the side of the bed? : A Lot Difficulty sitting down on and standing up from a chair with arms (e.g., wheelchair, bedside commode, etc,.)?: A Lot Help needed moving to and from a bed to chair (including a wheelchair)?: A Little Help needed walking in hospital room?: A Little Help needed climbing 3-5 steps with a railing? : A Lot 6 Click Score: 14    End of Session Equipment Utilized During Treatment: Gait belt;Right knee immobilizer Activity Tolerance: Patient tolerated treatment well Patient left: in chair;with call bell/phone within reach Nurse Communication: Mobility status PT Visit Diagnosis: Unsteadiness on feet (R26.81);Difficulty in walking, not elsewhere classified (R26.2);Pain Pain - Right/Left: Right Pain - part of body: Knee    Time: 1202-1238 PT Time Calculation (min) (ACUTE ONLY): 36 min   Charges:   PT Evaluation $PT Eval Low Complexity: 1 Low PT Treatments $Gait Training: 8-22 mins   PT G Codes:        Pg [phone]   Daryana Whirley 03/13/2017, 1:25 PM

## 2017-03-13 NOTE — Progress Notes (Signed)
   Subjective: 4 Days Post-Op Procedure(s) (LRB): IRRIGATION AND DEBRIDEMENT RIGHT KNEE PRE-PATELLAR BURSA (Right) Patient reports pain as mild.   Patient seen in rounds with Dr. Wynelle Link.  Discussed the findings and plan with patient.  Also discussed the pros and cons of further surgical intervention and the risks involved due to her bone and tissue quality.  Do not recommend andy further at this current time.  Plan to watch her progress over the next several days and weeks and see how she respond to the current treatment plan. Patient voices her concerns and her understanding of the plan at this time. Plan is to go Home after hospital stay with PICC line and ABX treatment.  Probably tomorrow at the earliest for discharge.  Will recheck the knee again in the morning.  Objective: Vital signs in last 24 hours: Temp:  [97.5 F (36.4 C)-98.6 F (37 C)] 97.5 F (36.4 C) (01/03 0550) Pulse Rate:  [83-94] 94 (01/03 0550) Resp:  [16-20] 16 (01/03 0550) BP: (101-128)/(48-63) 119/63 (01/03 0550) SpO2:  [93 %-97 %] 93 % (01/03 0550)  Intake/Output from previous day: 01/02 0701 - 01/03 0700 In: 1350 [P.O.:1050; IV Piggyback:300] Out: 300 [Urine:300] Intake/Output this shift: Total I/O In: 360 [P.O.:360] Out: -   Recent Labs    03/11/17 0336 03/12/17 0620  HGB 10.7* 11.2*   Recent Labs    03/11/17 0336 03/12/17 0620  WBC 14.0* 10.6*  RBC 4.12 4.22  HCT 34.4* 35.3*  PLT 325 313   Recent Labs    03/12/17 0620  NA 140  K 4.7  CL 106  CO2 26  BUN 31*  CREATININE 1.08*  GLUCOSE 153*  CALCIUM 8.7*   No results for input(s): LABPT, INR in the last 72 hours.  EXAM General - Patient is Alert and Appropriate Extremity - Neurovascular intact Sensation intact distally Dressing - scant drainage from the previous drain site, the incision looks okay at this time. Motor Function - intact, moving foot and toes well on exam.   Past Medical History:  Diagnosis Date  . Asthma   .  Complication of anesthesia    Anesthesia told her years ago she got too cold during surgery  . Diabetes mellitus without complication ([place])   . Emphysema   . Fibromyalgia   . Osteoporosis   . Rheumatoid arthritis(714.0)   . Sleep apnea    no cpap    Assessment/Plan: 4 Days Post-Op Procedure(s) (LRB): IRRIGATION AND DEBRIDEMENT RIGHT KNEE PRE-PATELLAR BURSA (Right) Principal Problem:   Arthritis, septic, knee (HCC) Active Problems:   Asthma   GERD   Rheumatoid arthritis ([place])   DIARRHEA, CHRONIC  Estimated body mass index is 28.87 kg/m as calculated from the following:   Height as of this encounter: 5\' 5"  (1.651 m).   Weight as of this encounter: 78.7 kg (173 lb 8 oz). Up with therapy  She can be WBAT to the right leg  DVT Prophylaxis - currently receiving heparin Weight-Bearing as tolerated to right leg Will recheck right knee in the morning  Arlee Muslim, PA-C Orthopaedic Surgery 03/13/2017, 8:57 AM

## 2017-03-14 ENCOUNTER — Encounter (HOSPITAL_COMMUNITY): Payer: Self-pay

## 2017-03-14 ENCOUNTER — Other Ambulatory Visit: Payer: Self-pay

## 2017-03-14 ENCOUNTER — Emergency Department (HOSPITAL_COMMUNITY)
Admission: EM | Admit: 2017-03-14 | Discharge: 2017-03-14 | Disposition: A | Discharge status: Rehabilitation Facility | Payer: Medicare Other | Attending: Emergency Medicine | Admitting: Emergency Medicine

## 2017-03-14 DIAGNOSIS — T148XXA Other injury of unspecified body region, initial encounter: Secondary | ICD-10-CM | POA: Diagnosis not present

## 2017-03-14 DIAGNOSIS — Z79899 Other long term (current) drug therapy: Secondary | ICD-10-CM | POA: Diagnosis not present

## 2017-03-14 DIAGNOSIS — Z112 Encounter for screening for other bacterial diseases: Secondary | ICD-10-CM | POA: Diagnosis not present

## 2017-03-14 DIAGNOSIS — M00861 Arthritis due to other bacteria, right knee: Secondary | ICD-10-CM | POA: Diagnosis not present

## 2017-03-14 DIAGNOSIS — F321 Major depressive disorder, single episode, moderate: Secondary | ICD-10-CM | POA: Diagnosis not present

## 2017-03-14 DIAGNOSIS — J4520 Mild intermittent asthma, uncomplicated: Secondary | ICD-10-CM | POA: Diagnosis not present

## 2017-03-14 DIAGNOSIS — M00869 Arthritis due to other bacteria, unspecified knee: Secondary | ICD-10-CM | POA: Diagnosis not present

## 2017-03-14 DIAGNOSIS — Z87891 Personal history of nicotine dependence: Secondary | ICD-10-CM | POA: Diagnosis not present

## 2017-03-14 DIAGNOSIS — Z7982 Long term (current) use of aspirin: Secondary | ICD-10-CM | POA: Diagnosis not present

## 2017-03-14 DIAGNOSIS — J309 Allergic rhinitis, unspecified: Secondary | ICD-10-CM | POA: Diagnosis not present

## 2017-03-14 DIAGNOSIS — M069 Rheumatoid arthritis, unspecified: Secondary | ICD-10-CM | POA: Diagnosis not present

## 2017-03-14 DIAGNOSIS — E785 Hyperlipidemia, unspecified: Secondary | ICD-10-CM | POA: Diagnosis not present

## 2017-03-14 DIAGNOSIS — M25561 Pain in right knee: Secondary | ICD-10-CM | POA: Diagnosis not present

## 2017-03-14 DIAGNOSIS — R279 Unspecified lack of coordination: Secondary | ICD-10-CM | POA: Diagnosis not present

## 2017-03-14 DIAGNOSIS — Z7984 Long term (current) use of oral hypoglycemic drugs: Secondary | ICD-10-CM | POA: Diagnosis not present

## 2017-03-14 DIAGNOSIS — M7041 Prepatellar bursitis, right knee: Secondary | ICD-10-CM | POA: Diagnosis not present

## 2017-03-14 DIAGNOSIS — R509 Fever, unspecified: Secondary | ICD-10-CM | POA: Diagnosis not present

## 2017-03-14 DIAGNOSIS — J45909 Unspecified asthma, uncomplicated: Secondary | ICD-10-CM | POA: Diagnosis not present

## 2017-03-14 DIAGNOSIS — E119 Type 2 diabetes mellitus without complications: Secondary | ICD-10-CM | POA: Diagnosis not present

## 2017-03-14 DIAGNOSIS — R2689 Other abnormalities of gait and mobility: Secondary | ICD-10-CM | POA: Diagnosis not present

## 2017-03-14 DIAGNOSIS — M009 Pyogenic arthritis, unspecified: Secondary | ICD-10-CM

## 2017-03-14 DIAGNOSIS — M6281 Muscle weakness (generalized): Secondary | ICD-10-CM | POA: Diagnosis not present

## 2017-03-14 DIAGNOSIS — M810 Age-related osteoporosis without current pathological fracture: Secondary | ICD-10-CM | POA: Diagnosis not present

## 2017-03-14 DIAGNOSIS — R197 Diarrhea, unspecified: Secondary | ICD-10-CM | POA: Diagnosis not present

## 2017-03-14 DIAGNOSIS — J449 Chronic obstructive pulmonary disease, unspecified: Secondary | ICD-10-CM | POA: Diagnosis not present

## 2017-03-14 DIAGNOSIS — G4730 Sleep apnea, unspecified: Secondary | ICD-10-CM | POA: Diagnosis not present

## 2017-03-14 DIAGNOSIS — M797 Fibromyalgia: Secondary | ICD-10-CM | POA: Diagnosis not present

## 2017-03-14 DIAGNOSIS — K210 Gastro-esophageal reflux disease with esophagitis: Secondary | ICD-10-CM | POA: Diagnosis not present

## 2017-03-14 DIAGNOSIS — R6520 Severe sepsis without septic shock: Secondary | ICD-10-CM | POA: Diagnosis not present

## 2017-03-14 DIAGNOSIS — E569 Vitamin deficiency, unspecified: Secondary | ICD-10-CM | POA: Diagnosis not present

## 2017-03-14 DIAGNOSIS — J439 Emphysema, unspecified: Secondary | ICD-10-CM | POA: Diagnosis not present

## 2017-03-14 DIAGNOSIS — R112 Nausea with vomiting, unspecified: Secondary | ICD-10-CM | POA: Diagnosis not present

## 2017-03-14 DIAGNOSIS — S00521S Blister (nonthermal) of lip, sequela: Secondary | ICD-10-CM | POA: Diagnosis not present

## 2017-03-14 DIAGNOSIS — J45998 Other asthma: Secondary | ICD-10-CM | POA: Diagnosis not present

## 2017-03-14 MED ORDER — OXYCODONE HCL 5 MG PO TABS
10.0000 mg | ORAL_TABLET | Freq: Once | ORAL | Status: AC
  Administered 2017-03-14: 10 mg via ORAL
  Filled 2017-03-14: qty 2

## 2017-03-14 MED ORDER — OXYCODONE HCL 10 MG PO TABS: 10.0000 mg | ORAL_TABLET | Freq: Four times a day (QID) | ORAL | 0 refills | Status: DC | PRN

## 2017-03-14 MED ORDER — VANCOMYCIN HCL 10 G IV SOLR
1500.0000 mg | Freq: Once | INTRAVENOUS | Status: AC
  Administered 2017-03-14: 1500 mg via INTRAVENOUS
  Filled 2017-03-14: qty 1500

## 2017-03-14 NOTE — NC FL2 (Signed)
[place] LEVEL OF CARE SCREENING TOOL     IDENTIFICATION  Patient Name: [name] Birthdate: [birth year] Sex: female Admission Date (Current Location): 03/14/2017  [facility] and [place] Number:  Herbalist and Address:  [facility], [place] [address] [place], [place] [address]      Provider Number: [id number]  Attending Physician Name and Address:  No att. providers found  Relative Name and Phone Number:       Current Level of Care: Hospital Recommended Level of Care: [place] Prior Approval Number:    Date Approved/Denied:   PASRR Number:   [id number]   Discharge Plan: SNF    Current Diagnoses: Patient Active Problem List   Diagnosis Date Noted  . Arthritis, septic, knee ([place]) 03/09/2017  . Septic prepatellar bursitis of right knee 10/26/2014  . Prepatellar bursitis of right knee 08/28/2014  . DYSPHAGIA ORAL PHASE 06/18/2007  . Asthma 05/14/2007  . SLEEP APNEA 05/14/2007  . GERD 05/13/2007  . Rheumatoid arthritis ([place]) 05/13/2007  . OSTEOPOROSIS 05/13/2007  . DIARRHEA, CHRONIC 05/13/2007    Orientation RESPIRATION BLADDER Height & Weight     Self, Time, Situation, Place    Continent Weight:   Height:     BEHAVIORAL SYMPTOMS/MOOD NEUROLOGICAL BOWEL NUTRITION STATUS      Continent Diet(please see discharge summary. )  AMBULATORY STATUS COMMUNICATION OF NEEDS Skin       Normal                       Personal Care Assistance Level of Assistance  Bathing, Feeding, Dressing Bathing Assistance: Maximum assistance Feeding assistance: Limited assistance Dressing Assistance: Maximum assistance     Functional Limitations Info  Sight, Hearing, Speech Sight Info: Adequate Hearing Info: Adequate Speech Info: Adequate    SPECIAL CARE FACTORS FREQUENCY  OT (By licensed OT), PT (By licensed PT)     PT Frequency: 5 times a week  OT Frequency: 5 times a week             Contractures  Contractures Info: Not present    Additional Factors Info  Code Status, Allergies Code Status Info: Prior  Allergies Info: Penicillins, Sulfonamide Derivatives           Current Medications (03/14/2017):  This is the current hospital active medication list No current facility-administered medications for this encounter.    Current Outpatient Medications  Medication Sig Dispense Refill  . Albuterol Sulfate (PROAIR RESPICLICK) 592 (90 Base) MCG/ACT AEPB Inhale 2 puffs into the lungs as needed (every four to six hours for cough or wheeze). (Patient taking differently: Inhale 2 puffs into the lungs every 4 (four) hours as needed (cough/wheezing). ) 1 each 1  . aspirin EC 81 MG tablet Take 81 mg by mouth 2 (two) times a week.     Marland Kitchen atorvastatin (LIPITOR) 40 MG tablet Take 40 mg by mouth at bedtime.     . beclomethasone (QVAR) 80 MCG/ACT inhaler Inhale 2 puffs into the lungs 2 (two) times daily. (Patient taking differently: Inhale 2 puffs into the lungs 2 (two) times daily as needed (shortness of breath/wheezing). ) 1 Inhaler 5  . cholecalciferol (VITAMIN D) 1000 UNITS tablet Take 1,000 Units by mouth at bedtime.     . Cyanocobalamin (B-12) 2500 MCG TABS Take 2,500 mcg by mouth at bedtime.     Marland Kitchen doxycycline (VIBRA-TABS) 100 MG tablet Take 100 mg by mouth 2 (two) times daily.  10 day course started 03/06/17 pm    . DULoxetine (CYMBALTA) 60 MG capsule Take 120 mg by mouth at bedtime.     Marland Kitchen ezetimibe (ZETIA) 10 MG tablet Take 10 mg by mouth at bedtime.     . fluticasone furoate-vilanterol (BREO ELLIPTA) 200-25 MCG/INH AEPB Inhale 1 puff into the lungs daily. (Patient taking differently: Inhale 1 puff into the lungs daily as needed (shortness of breath/wheezing). ) 60 each 5  . gabapentin (NEURONTIN) 100 MG capsule Take 100 mg by mouth at bedtime.    Marland Kitchen linagliptin (TRADJENTA) 5 MG TABS tablet Take 5 mg by mouth at bedtime.     Marland Kitchen loratadine (CLARITIN) 10 MG tablet Take 10 mg by mouth at bedtime.     .  metFORMIN (GLUCOPHAGE) 500 MG tablet Take 1,000 mg by mouth at bedtime.     . montelukast (SINGULAIR) 10 MG tablet TAKE ONE TABLET BY MOUTH ONCE DAILY (Patient taking differently: TAKE ONE TABLET (10 MG) BY MOUTH ONCE DAILY AT BEDTIME) 30 tablet 0  . morphine (MSIR) 30 MG tablet Take 30 mg by mouth daily as needed for severe pain.    . Multiple Vitamin (MULTIVITAMIN WITH MINERALS) TABS tablet Take 1 tablet by mouth at bedtime.    . Omega-3 Fatty Acids (FISH OIL) 1200 MG CAPS Take 1,200 mg by mouth at bedtime.     . ondansetron (ZOFRAN) 4 MG tablet Take 1 tablet (4 mg total) by mouth every 6 (six) hours as needed for nausea. 20 tablet 0  . oxyCODONE 10 MG TABS Take 1 tablet (10 mg total) by mouth every 6 (six) hours as needed for severe pain or breakthrough pain. 12 tablet 0  . pantoprazole (PROTONIX) 40 MG tablet Take one tablet once every morning (Patient taking differently: Take 40 mg by mouth at bedtime. ) 30 tablet 5  . predniSONE (DELTASONE) 5 MG tablet Take 5 mg by mouth at bedtime. For rheumatoid arthritis    . ranitidine (ZANTAC) 150 MG tablet TAKE TWO TABLETS EVERY EVENING (Patient taking differently: Take 300 mg by mouth at bedtime. ) 180 tablet 1  . vancomycin IVPB Inject 1,500 mg into the vein daily for 24 days. Indication:  Septic bursitis of right knee Last Day of Therapy:  04/06/17  Labs - Monday:  CBC/D, BMP, CRP, and vancomycin trough. Labs - Thursday:  BMP Labs - Every other week:  ESR 24 Units 0     Discharge Medications: Please see discharge summary for a list of discharge medications.  Relevant Imaging Results:  Relevant Lab Results:   Additional Information SSN- [id number]  Wetzel Bjornstad, LCSWA

## 2017-03-14 NOTE — ED Triage Notes (Signed)
Pt BIB EMS from home. Pt was just d/c from this facility yesterday after right knee surgery/infection of the right knee. Pt refused rehab services upon d/c and went home. Home health came out to pt's home today to admin abx and pt was unable to walk on own. Pt A/Ox4.

## 2017-03-14 NOTE — Progress Notes (Addendum)
1:09pm: CSW spoke with Claiborne Billings from [facility] and  Pt is able to go [facility] SNF today. CSW asked that RNCM speak with pt to inform pt of this at this time. CSW was informed that AVS and medictaion list would be sent with pt via PTAR. At this time CSW has spoken RN and updated her of this. RN to call report to [phone] and to call for PTAR when pt is ready. Pt will go to rm 610B. CSW signing off.   1:05pm: CSW made aware that [facility] is not able to take pt at [facility]. CSW has reached out to [place] at [facility] to see if they can meet pt's need at this time.   12:26pm- CSW has reached out to [place] at [facility] to see if they are able to meet pt's needs. [facility] to call CSW back when decision has been made.   CSW spoke with [facility] and gave her options to give pt for bed choices. CSW to get a call from [facility] with choice.    Virgie Dad Josceline Chenard, MSW, [facility] Clinical Social Worker [phone]

## 2017-03-14 NOTE — ED Notes (Signed)
Attempted to call [facility] for patient to be transferred after antibiotics. No response form facility x3.

## 2017-03-14 NOTE — ED Notes (Signed)
Bed: WA01 Expected date:  Expected time:  Means of arrival:  Comments: 

## 2017-03-14 NOTE — Care Management Note (Signed)
Case Management Note  CM contacted by Pam with [facility] IV infusion team stating pt would be coming back to the ED today for placement.  Advised that the inpt team encouraged pt to go to SNF yesterday but pt wanted to go home.  On IV teams arrival to her home today pt acknowledged she could not be by herself.  CSW aware and working on SNF placement.  No further CM needs noted at this time.

## 2017-03-14 NOTE — ED Provider Notes (Signed)
[facility] DEPT Provider Note   CSN: [id number] Arrival date & time: 03/14/17  1111     History   Chief Complaint Chief Complaint  Patient presents with  . Knee Pain    HPI [name] is a 77 y.o. female.  Patient is a 77 year old female who was recently discharged from the hospital yesterday after found to have a septic arthritis of her knee resulting in 24 days of IV vancomycin.  Patient while hospitalized was encouraged to go to rehab but she insisted on going home.  Patient has been home for less than 24 hours and 1 home health came today patient states she cannot do it at home and needs to be in the facility.  Patient has no acute complaints at this time requiring further medical workup.  Social work is already looking for placement.   The history is provided by the patient.  Knee Pain      Past Medical History:  Diagnosis Date  . Asthma   . Complication of anesthesia    Anesthesia told her years ago she got too cold during surgery  . Diabetes mellitus without complication ([place])   . Emphysema   . Fibromyalgia   . Osteoporosis   . Rheumatoid arthritis(714.0)   . Sleep apnea    no cpap    Patient Active Problem List   Diagnosis Date Noted  . Arthritis, septic, knee ([place]) 03/09/2017  . Septic prepatellar bursitis of right knee 10/26/2014  . Prepatellar bursitis of right knee 08/28/2014  . DYSPHAGIA ORAL PHASE 06/18/2007  . Asthma 05/14/2007  . SLEEP APNEA 05/14/2007  . GERD 05/13/2007  . Rheumatoid arthritis ([place]) 05/13/2007  . OSTEOPOROSIS 05/13/2007  . DIARRHEA, CHRONIC 05/13/2007    Past Surgical History:  Procedure Laterality Date  . ABDOMINAL HYSTERECTOMY    . ANKLE FRACTURE SURGERY  2007  . BREAST SURGERY     mammoplasty and then removed  . FOOT SURGERY     numerous  . I&D EXTREMITY Right 10/26/2014   Procedure: IRRIGATION AND DEBRIDEMENT RIGHT KNEE ;  Surgeon: Gaynelle Arabian, MD;  Location: [facility];  Service:  Orthopedics;  Laterality: Right;  . IRRIGATION AND DEBRIDEMENT KNEE Right 03/09/2017   Procedure: IRRIGATION AND DEBRIDEMENT RIGHT KNEE PRE-PATELLAR BURSA;  Surgeon: Susa Day, MD;  Location: [facility];  Service: Orthopedics;  Laterality: Right;  . KNEE BURSECTOMY Right 09/16/2014   Procedure: EXCISON OF PRE PATELLA BURSA RIGHT KNEE ;  Surgeon: Gaynelle Arabian, MD;  Location: [facility];  Service: Orthopedics;  Laterality: Right;  . PARTIAL HYSTERECTOMY  1970  . TOTAL KNEE ARTHROPLASTY  2005 / 2006   x2    OB History    No data available       Home Medications    Prior to Admission medications   Medication Sig Start Date End Date Taking? Authorizing Provider  aspirin EC 81 MG tablet Take 81 mg by mouth 2 (two) times a week.    Yes [provider]  atorvastatin (LIPITOR) 40 MG tablet Take 40 mg by mouth at bedtime.  02/24/13  Yes [provider]  cholecalciferol (VITAMIN D) 1000 UNITS tablet Take 1,000 Units by mouth at bedtime.    Yes [provider]  Cyanocobalamin (B-12) 2500 MCG TABS Take 2,500 mcg by mouth at bedtime.    Yes [provider]  doxycycline (VIBRA-TABS) 100 MG tablet Take 100 mg by mouth 2 (two) times daily. 10 day course started 03/06/17 pm 03/06/17  Yes [provider]  DULoxetine (CYMBALTA) 60 MG capsule Take 120 mg by mouth at bedtime.    Yes [provider]  ezetimibe (ZETIA) 10 MG tablet Take 10 mg by mouth at bedtime.  11/04/16  Yes [provider]  gabapentin (NEURONTIN) 100 MG capsule Take 100 mg by mouth at bedtime.   Yes [provider]  linagliptin (TRADJENTA) 5 MG TABS tablet Take 5 mg by mouth at bedtime.    Yes [provider]  loratadine (CLARITIN) 10 MG tablet Take 10 mg by mouth at bedtime.    Yes [provider]  metFORMIN (GLUCOPHAGE) 500 MG tablet Take 1,000 mg by mouth at bedtime.  01/04/16  Yes [provider]  montelukast (SINGULAIR) 10 MG tablet TAKE ONE  TABLET BY MOUTH ONCE DAILY Patient taking differently: TAKE ONE TABLET (10 MG) BY MOUTH ONCE DAILY AT BEDTIME 02/03/17  Yes Kozlow, Donnamarie Poag, MD  morphine (MSIR) 30 MG tablet Take 30 mg by mouth daily as needed for severe pain.   Yes [provider]  Multiple Vitamin (MULTIVITAMIN WITH MINERALS) TABS tablet Take 1 tablet by mouth at bedtime.   Yes [provider]  Omega-3 Fatty Acids (FISH OIL) 1200 MG CAPS Take 1,200 mg by mouth at bedtime.    Yes [provider]  oxyCODONE 10 MG TABS Take 1 tablet (10 mg total) by mouth every 6 (six) hours as needed for severe pain or breakthrough pain. 03/13/17  Yes Emokpae, Courage, MD  pantoprazole (PROTONIX) 40 MG tablet Take one tablet once every morning Patient taking differently: Take 40 mg by mouth at bedtime.  10/18/15  Yes Kozlow, Donnamarie Poag, MD  predniSONE (DELTASONE) 5 MG tablet Take 5 mg by mouth at bedtime. For rheumatoid arthritis 01/02/17  Yes [provider]  ranitidine (ZANTAC) 150 MG tablet TAKE TWO TABLETS EVERY EVENING Patient taking differently: Take 300 mg by mouth at bedtime.  01/23/16  Yes Kozlow, Donnamarie Poag, MD  vancomycin IVPB Inject 1,500 mg into the vein daily for 24 days. Indication:  Septic bursitis of right knee Last Day of Therapy:  04/06/17  Labs - Monday:  CBC/D, BMP, CRP, and vancomycin trough. Labs - Thursday:  BMP Labs - Every other week:  ESR 03/13/17 04/06/17 Yes Emokpae, Courage, MD  Albuterol Sulfate (PROAIR RESPICLICK) 409 (90 Base) MCG/ACT AEPB Inhale 2 puffs into the lungs as needed (every four to six hours for cough or wheeze). Patient not taking: Reported on 03/14/2017 05/13/16   Jiles Prows, MD  beclomethasone (QVAR) 80 MCG/ACT inhaler Inhale 2 puffs into the lungs 2 (two) times daily. Patient not taking: Reported on 03/14/2017 01/18/16   Jiles Prows, MD  fluticasone furoate-vilanterol (BREO ELLIPTA) 200-25 MCG/INH AEPB Inhale 1 puff into the lungs daily. Patient not taking: Reported on 03/14/2017  08/30/15   Jiles Prows, MD  ondansetron (ZOFRAN) 4 MG tablet Take 1 tablet (4 mg total) by mouth every 6 (six) hours as needed for nausea. Patient not taking: Reported on 03/14/2017 03/13/17   Roxan Hockey, MD    Family History Family History  Problem Relation Age of Onset  . Hypertension Unknown   . Diabetes Unknown   . Stroke Unknown   . Diabetes Mother   . Diabetes Father   . Diabetes Sister   . Diabetes Maternal Aunt   . Cancer Paternal Uncle   . Cancer Paternal Grandmother   . Breast cancer Paternal Grandmother     Social History Social History  Tobacco Use  . Smoking status: Former Smoker    Last attempt to quit: 03/11/1994    Years since quitting: 23.0  . Smokeless tobacco: Never Used  Substance Use Topics  . Alcohol use: No  . Drug use: No     Allergies   Penicillins and Sulfonamide derivatives   Review of Systems Review of Systems  All other systems reviewed and are negative.    Physical Exam Updated Vital Signs BP (!) 108/51 (BP Location: Left Arm)   Pulse 94   Temp 98.7 F (37.1 C) (Oral)   Resp 18   Ht 5' 5"  (1.651 m)   Wt 78.7 kg (173 lb 8 oz)   SpO2 96%   BMI 28.87 kg/m   Physical Exam  Constitutional: She is oriented to person, place, and time. She appears well-developed and well-nourished. No distress.  HENT:  Head: Normocephalic and atraumatic.  Eyes: EOM are normal. Pupils are equal, round, and reactive to light.  Cardiovascular: Normal rate.  Pulmonary/Chest: Effort normal.  Neurological: She is alert and oriented to person, place, and time.  Skin: Skin is warm and dry.  Psychiatric: She has a normal mood and affect. Her behavior is normal.  Nursing note and vitals reviewed.    ED Treatments / Results  Labs (all labs ordered are listed, but only abnormal results are displayed) Labs Reviewed - No data to display  EKG  EKG Interpretation None       Radiology No results found.  Procedures Procedures (including  critical care time)  Medications Ordered in ED Medications  vancomycin (VANCOCIN) 1,500 mg in sodium chloride 0.9 % 500 mL IVPB (1,500 mg Intravenous New Bag/Given 03/14/17 1213)  oxyCODONE (Oxy IR/ROXICODONE) immediate release tablet 10 mg (10 mg Oral Given 03/14/17 1203)     Initial Impression / Assessment and Plan / ED Course  I have reviewed the triage vital signs and the nursing notes.  Pertinent labs & imaging results that were available during my care of the patient were reviewed by me and considered in my medical decision making (see chart for details).     Patient here for rehab placement.  No acute medical conditions at this time.  Patient is behind on her IV vancomycin and a dose of that was ordered.  She was also given a home dose of her pain medication.  Social work already has all the paperwork required and is currently seeking placement.  1:37 PM Pt will go to [facility].  Final Clinical Impressions(s) / ED Diagnoses   Final diagnoses:  Pyogenic arthritis of right knee joint, due to unspecified organism [facility])    ED Discharge Orders    None       Blanchie Dessert, MD 03/14/17 1338

## 2017-03-14 NOTE — ED Notes (Signed)
[facility] at bedside to transport patient to [facility].

## 2017-03-15 LAB — AEROBIC/ANAEROBIC CULTURE W GRAM STAIN (SURGICAL/DEEP WOUND)

## 2017-03-15 LAB — AEROBIC/ANAEROBIC CULTURE (SURGICAL/DEEP WOUND)

## 2017-03-18 DIAGNOSIS — J45998 Other asthma: Secondary | ICD-10-CM | POA: Diagnosis not present

## 2017-03-18 DIAGNOSIS — M6281 Muscle weakness (generalized): Secondary | ICD-10-CM | POA: Diagnosis not present

## 2017-03-18 DIAGNOSIS — M7041 Prepatellar bursitis, right knee: Secondary | ICD-10-CM | POA: Diagnosis not present

## 2017-03-18 DIAGNOSIS — E569 Vitamin deficiency, unspecified: Secondary | ICD-10-CM | POA: Diagnosis not present

## 2017-03-18 DIAGNOSIS — M797 Fibromyalgia: Secondary | ICD-10-CM | POA: Diagnosis not present

## 2017-03-18 DIAGNOSIS — E119 Type 2 diabetes mellitus without complications: Secondary | ICD-10-CM | POA: Diagnosis not present

## 2017-03-18 DIAGNOSIS — M810 Age-related osteoporosis without current pathological fracture: Secondary | ICD-10-CM | POA: Diagnosis not present

## 2017-03-18 DIAGNOSIS — M069 Rheumatoid arthritis, unspecified: Secondary | ICD-10-CM | POA: Diagnosis not present

## 2017-03-18 DIAGNOSIS — E785 Hyperlipidemia, unspecified: Secondary | ICD-10-CM | POA: Diagnosis not present

## 2017-03-18 DIAGNOSIS — J309 Allergic rhinitis, unspecified: Secondary | ICD-10-CM | POA: Diagnosis not present

## 2017-03-18 DIAGNOSIS — J439 Emphysema, unspecified: Secondary | ICD-10-CM | POA: Diagnosis not present

## 2017-03-18 DIAGNOSIS — M00869 Arthritis due to other bacteria, unspecified knee: Secondary | ICD-10-CM | POA: Diagnosis not present

## 2017-03-19 DIAGNOSIS — M797 Fibromyalgia: Secondary | ICD-10-CM | POA: Diagnosis not present

## 2017-03-19 DIAGNOSIS — M00869 Arthritis due to other bacteria, unspecified knee: Secondary | ICD-10-CM | POA: Diagnosis not present

## 2017-03-19 DIAGNOSIS — M069 Rheumatoid arthritis, unspecified: Secondary | ICD-10-CM | POA: Diagnosis not present

## 2017-03-19 DIAGNOSIS — E569 Vitamin deficiency, unspecified: Secondary | ICD-10-CM | POA: Diagnosis not present

## 2017-03-19 DIAGNOSIS — J309 Allergic rhinitis, unspecified: Secondary | ICD-10-CM | POA: Diagnosis not present

## 2017-03-19 DIAGNOSIS — J439 Emphysema, unspecified: Secondary | ICD-10-CM | POA: Diagnosis not present

## 2017-03-19 DIAGNOSIS — E119 Type 2 diabetes mellitus without complications: Secondary | ICD-10-CM | POA: Diagnosis not present

## 2017-03-19 DIAGNOSIS — M810 Age-related osteoporosis without current pathological fracture: Secondary | ICD-10-CM | POA: Diagnosis not present

## 2017-03-19 DIAGNOSIS — J45998 Other asthma: Secondary | ICD-10-CM | POA: Diagnosis not present

## 2017-03-19 DIAGNOSIS — E785 Hyperlipidemia, unspecified: Secondary | ICD-10-CM | POA: Diagnosis not present

## 2017-03-19 DIAGNOSIS — M7041 Prepatellar bursitis, right knee: Secondary | ICD-10-CM | POA: Diagnosis not present

## 2017-03-19 DIAGNOSIS — M6281 Muscle weakness (generalized): Secondary | ICD-10-CM | POA: Diagnosis not present

## 2017-03-21 DIAGNOSIS — E569 Vitamin deficiency, unspecified: Secondary | ICD-10-CM | POA: Diagnosis not present

## 2017-03-21 DIAGNOSIS — E119 Type 2 diabetes mellitus without complications: Secondary | ICD-10-CM | POA: Diagnosis not present

## 2017-03-21 DIAGNOSIS — M7041 Prepatellar bursitis, right knee: Secondary | ICD-10-CM | POA: Diagnosis not present

## 2017-03-21 DIAGNOSIS — E785 Hyperlipidemia, unspecified: Secondary | ICD-10-CM | POA: Diagnosis not present

## 2017-03-21 DIAGNOSIS — J309 Allergic rhinitis, unspecified: Secondary | ICD-10-CM | POA: Diagnosis not present

## 2017-03-21 DIAGNOSIS — J45998 Other asthma: Secondary | ICD-10-CM | POA: Diagnosis not present

## 2017-03-21 DIAGNOSIS — M6281 Muscle weakness (generalized): Secondary | ICD-10-CM | POA: Diagnosis not present

## 2017-03-21 DIAGNOSIS — M069 Rheumatoid arthritis, unspecified: Secondary | ICD-10-CM | POA: Diagnosis not present

## 2017-03-21 DIAGNOSIS — M810 Age-related osteoporosis without current pathological fracture: Secondary | ICD-10-CM | POA: Diagnosis not present

## 2017-03-21 DIAGNOSIS — S00521S Blister (nonthermal) of lip, sequela: Secondary | ICD-10-CM | POA: Diagnosis not present

## 2017-03-21 DIAGNOSIS — M797 Fibromyalgia: Secondary | ICD-10-CM | POA: Diagnosis not present

## 2017-03-21 DIAGNOSIS — J439 Emphysema, unspecified: Secondary | ICD-10-CM | POA: Diagnosis not present

## 2017-03-24 DIAGNOSIS — J439 Emphysema, unspecified: Secondary | ICD-10-CM | POA: Diagnosis not present

## 2017-03-24 DIAGNOSIS — E119 Type 2 diabetes mellitus without complications: Secondary | ICD-10-CM | POA: Diagnosis not present

## 2017-03-24 DIAGNOSIS — M810 Age-related osteoporosis without current pathological fracture: Secondary | ICD-10-CM | POA: Diagnosis not present

## 2017-03-24 DIAGNOSIS — M7041 Prepatellar bursitis, right knee: Secondary | ICD-10-CM | POA: Diagnosis not present

## 2017-03-24 DIAGNOSIS — M00869 Arthritis due to other bacteria, unspecified knee: Secondary | ICD-10-CM | POA: Diagnosis not present

## 2017-03-24 DIAGNOSIS — S00521S Blister (nonthermal) of lip, sequela: Secondary | ICD-10-CM | POA: Diagnosis not present

## 2017-03-24 DIAGNOSIS — E785 Hyperlipidemia, unspecified: Secondary | ICD-10-CM | POA: Diagnosis not present

## 2017-03-24 DIAGNOSIS — E569 Vitamin deficiency, unspecified: Secondary | ICD-10-CM | POA: Diagnosis not present

## 2017-03-24 DIAGNOSIS — M797 Fibromyalgia: Secondary | ICD-10-CM | POA: Diagnosis not present

## 2017-03-24 DIAGNOSIS — M6281 Muscle weakness (generalized): Secondary | ICD-10-CM | POA: Diagnosis not present

## 2017-03-24 DIAGNOSIS — M069 Rheumatoid arthritis, unspecified: Secondary | ICD-10-CM | POA: Diagnosis not present

## 2017-03-24 DIAGNOSIS — J45998 Other asthma: Secondary | ICD-10-CM | POA: Diagnosis not present

## 2017-03-25 DIAGNOSIS — Z5189 Encounter for other specified aftercare: Secondary | ICD-10-CM | POA: Insufficient documentation

## 2017-03-25 DIAGNOSIS — M25561 Pain in right knee: Secondary | ICD-10-CM | POA: Insufficient documentation

## 2017-03-26 DIAGNOSIS — M6281 Muscle weakness (generalized): Secondary | ICD-10-CM | POA: Diagnosis not present

## 2017-03-26 DIAGNOSIS — M7041 Prepatellar bursitis, right knee: Secondary | ICD-10-CM | POA: Diagnosis not present

## 2017-03-27 DIAGNOSIS — S00521S Blister (nonthermal) of lip, sequela: Secondary | ICD-10-CM | POA: Diagnosis not present

## 2017-03-27 DIAGNOSIS — J439 Emphysema, unspecified: Secondary | ICD-10-CM | POA: Diagnosis not present

## 2017-03-27 DIAGNOSIS — M7041 Prepatellar bursitis, right knee: Secondary | ICD-10-CM | POA: Diagnosis not present

## 2017-03-27 DIAGNOSIS — M797 Fibromyalgia: Secondary | ICD-10-CM | POA: Diagnosis not present

## 2017-03-27 DIAGNOSIS — M069 Rheumatoid arthritis, unspecified: Secondary | ICD-10-CM | POA: Diagnosis not present

## 2017-03-27 DIAGNOSIS — M810 Age-related osteoporosis without current pathological fracture: Secondary | ICD-10-CM | POA: Diagnosis not present

## 2017-03-27 DIAGNOSIS — M00869 Arthritis due to other bacteria, unspecified knee: Secondary | ICD-10-CM | POA: Diagnosis not present

## 2017-03-27 DIAGNOSIS — E785 Hyperlipidemia, unspecified: Secondary | ICD-10-CM | POA: Diagnosis not present

## 2017-03-27 DIAGNOSIS — J45998 Other asthma: Secondary | ICD-10-CM | POA: Diagnosis not present

## 2017-03-27 DIAGNOSIS — E569 Vitamin deficiency, unspecified: Secondary | ICD-10-CM | POA: Diagnosis not present

## 2017-03-27 DIAGNOSIS — M6281 Muscle weakness (generalized): Secondary | ICD-10-CM | POA: Diagnosis not present

## 2017-03-27 DIAGNOSIS — E119 Type 2 diabetes mellitus without complications: Secondary | ICD-10-CM | POA: Diagnosis not present

## 2017-03-28 DIAGNOSIS — E785 Hyperlipidemia, unspecified: Secondary | ICD-10-CM | POA: Diagnosis not present

## 2017-03-28 DIAGNOSIS — Z5181 Encounter for therapeutic drug level monitoring: Secondary | ICD-10-CM | POA: Diagnosis not present

## 2017-03-28 DIAGNOSIS — M069 Rheumatoid arthritis, unspecified: Secondary | ICD-10-CM | POA: Diagnosis not present

## 2017-03-28 DIAGNOSIS — Z452 Encounter for adjustment and management of vascular access device: Secondary | ICD-10-CM | POA: Diagnosis not present

## 2017-03-28 DIAGNOSIS — J439 Emphysema, unspecified: Secondary | ICD-10-CM | POA: Diagnosis not present

## 2017-03-28 DIAGNOSIS — G4730 Sleep apnea, unspecified: Secondary | ICD-10-CM | POA: Diagnosis not present

## 2017-03-28 DIAGNOSIS — Z7982 Long term (current) use of aspirin: Secondary | ICD-10-CM | POA: Diagnosis not present

## 2017-03-28 DIAGNOSIS — F321 Major depressive disorder, single episode, moderate: Secondary | ICD-10-CM | POA: Diagnosis not present

## 2017-03-28 DIAGNOSIS — M00861 Arthritis due to other bacteria, right knee: Secondary | ICD-10-CM | POA: Diagnosis not present

## 2017-03-28 DIAGNOSIS — E119 Type 2 diabetes mellitus without complications: Secondary | ICD-10-CM | POA: Diagnosis not present

## 2017-03-28 DIAGNOSIS — M797 Fibromyalgia: Secondary | ICD-10-CM | POA: Diagnosis not present

## 2017-03-28 DIAGNOSIS — M810 Age-related osteoporosis without current pathological fracture: Secondary | ICD-10-CM | POA: Diagnosis not present

## 2017-03-28 DIAGNOSIS — Z794 Long term (current) use of insulin: Secondary | ICD-10-CM | POA: Diagnosis not present

## 2017-03-28 DIAGNOSIS — Z7952 Long term (current) use of systemic steroids: Secondary | ICD-10-CM | POA: Diagnosis not present

## 2017-03-28 DIAGNOSIS — Z87891 Personal history of nicotine dependence: Secondary | ICD-10-CM | POA: Diagnosis not present

## 2017-03-28 DIAGNOSIS — K210 Gastro-esophageal reflux disease with esophagitis: Secondary | ICD-10-CM | POA: Diagnosis not present

## 2017-03-28 DIAGNOSIS — M71161 Other infective bursitis, right knee: Secondary | ICD-10-CM | POA: Diagnosis not present

## 2017-04-01 DIAGNOSIS — M00861 Arthritis due to other bacteria, right knee: Secondary | ICD-10-CM | POA: Diagnosis not present

## 2017-04-01 DIAGNOSIS — E119 Type 2 diabetes mellitus without complications: Secondary | ICD-10-CM | POA: Diagnosis not present

## 2017-04-01 DIAGNOSIS — M0080 Arthritis due to other bacteria, unspecified joint: Secondary | ICD-10-CM | POA: Diagnosis not present

## 2017-04-01 DIAGNOSIS — M71161 Other infective bursitis, right knee: Secondary | ICD-10-CM | POA: Diagnosis not present

## 2017-04-01 DIAGNOSIS — M069 Rheumatoid arthritis, unspecified: Secondary | ICD-10-CM | POA: Diagnosis not present

## 2017-04-01 DIAGNOSIS — J439 Emphysema, unspecified: Secondary | ICD-10-CM | POA: Diagnosis not present

## 2017-04-01 DIAGNOSIS — M797 Fibromyalgia: Secondary | ICD-10-CM | POA: Diagnosis not present

## 2017-04-02 DIAGNOSIS — E119 Type 2 diabetes mellitus without complications: Secondary | ICD-10-CM | POA: Diagnosis not present

## 2017-04-02 DIAGNOSIS — M71161 Other infective bursitis, right knee: Secondary | ICD-10-CM | POA: Diagnosis not present

## 2017-04-02 DIAGNOSIS — M069 Rheumatoid arthritis, unspecified: Secondary | ICD-10-CM | POA: Diagnosis not present

## 2017-04-02 DIAGNOSIS — J439 Emphysema, unspecified: Secondary | ICD-10-CM | POA: Diagnosis not present

## 2017-04-02 DIAGNOSIS — M00861 Arthritis due to other bacteria, right knee: Secondary | ICD-10-CM | POA: Diagnosis not present

## 2017-04-02 DIAGNOSIS — M797 Fibromyalgia: Secondary | ICD-10-CM | POA: Diagnosis not present

## 2017-04-03 ENCOUNTER — Telehealth: Payer: Self-pay | Admitting: *Deleted

## 2017-04-03 DIAGNOSIS — M00861 Arthritis due to other bacteria, right knee: Secondary | ICD-10-CM | POA: Diagnosis not present

## 2017-04-03 DIAGNOSIS — M069 Rheumatoid arthritis, unspecified: Secondary | ICD-10-CM | POA: Diagnosis not present

## 2017-04-03 DIAGNOSIS — M797 Fibromyalgia: Secondary | ICD-10-CM | POA: Diagnosis not present

## 2017-04-03 DIAGNOSIS — M71161 Other infective bursitis, right knee: Secondary | ICD-10-CM | POA: Diagnosis not present

## 2017-04-03 DIAGNOSIS — E119 Type 2 diabetes mellitus without complications: Secondary | ICD-10-CM | POA: Diagnosis not present

## 2017-04-03 DIAGNOSIS — J439 Emphysema, unspecified: Secondary | ICD-10-CM | POA: Diagnosis not present

## 2017-04-03 NOTE — Telephone Encounter (Signed)
[name] with [facility] called to advise that the patient knee is red, swollen, warm to the touch and she is having increased pain. She advised the patient is taking an increased dose of Vanc due to her recent vanc trough level. She could not tell me the exact dose just that the pharmacy had increased it. Advised her will make sure the provider gets the message and we will check on the patient after she responds.

## 2017-04-04 ENCOUNTER — Other Ambulatory Visit: Payer: Self-pay | Admitting: Pharmacist

## 2017-04-04 ENCOUNTER — Telehealth: Payer: Self-pay | Admitting: *Deleted

## 2017-04-04 DIAGNOSIS — M25561 Pain in right knee: Secondary | ICD-10-CM | POA: Diagnosis not present

## 2017-04-04 DIAGNOSIS — Z96651 Presence of right artificial knee joint: Secondary | ICD-10-CM | POA: Diagnosis not present

## 2017-04-04 NOTE — Telephone Encounter (Signed)
Patient is seeing PA at [facility] today at 2:45. Per verbal order per Dr Baxter Flattery, extended IV antibiotics for 2 more weeks through 2/10 - order given to [facility] at [place].  RN will follow up with patient on Monday to see what Ortho does, will schedule follow up with Dr Baxter Flattery based on that.  Landis Gandy, RN

## 2017-04-04 NOTE — Telephone Encounter (Signed)
Patient scheduled for follow up with Dr Baxter Flattery 1/31. Lyda Jester left her a message with appointment information on her home number.

## 2017-04-05 ENCOUNTER — Telehealth: Payer: Self-pay | Admitting: Infectious Disease

## 2017-04-05 NOTE — Telephone Encounter (Signed)
Pt had worsening swelling of her knee on vancomycin  Dr. Maureen Ralphs did aspirate of the knee and sent for analysis at his clinic  He told pt to STOP her abx  Patient confused because our office wanted her to continue them  I will try to reach out to his office but best thing is for Dr Maureen Ralphs and Baxter Flattery to converse on Monday and review what each is thinking   It sounds like Alusio is on verge of taking out everything from knee  IN interim I instructed pt to stay on vancomycin until instructed by Dr. Baxter Flattery to stop

## 2017-04-06 DIAGNOSIS — M71161 Other infective bursitis, right knee: Secondary | ICD-10-CM | POA: Diagnosis not present

## 2017-04-06 DIAGNOSIS — Z5181 Encounter for therapeutic drug level monitoring: Secondary | ICD-10-CM | POA: Diagnosis not present

## 2017-04-06 DIAGNOSIS — M797 Fibromyalgia: Secondary | ICD-10-CM | POA: Diagnosis not present

## 2017-04-06 DIAGNOSIS — M00861 Arthritis due to other bacteria, right knee: Secondary | ICD-10-CM | POA: Diagnosis not present

## 2017-04-06 DIAGNOSIS — J439 Emphysema, unspecified: Secondary | ICD-10-CM | POA: Diagnosis not present

## 2017-04-06 DIAGNOSIS — E119 Type 2 diabetes mellitus without complications: Secondary | ICD-10-CM | POA: Diagnosis not present

## 2017-04-06 DIAGNOSIS — M069 Rheumatoid arthritis, unspecified: Secondary | ICD-10-CM | POA: Diagnosis not present

## 2017-04-08 ENCOUNTER — Ambulatory Visit (INDEPENDENT_AMBULATORY_CARE_PROVIDER_SITE_OTHER): Payer: Medicare Other | Admitting: Internal Medicine

## 2017-04-08 ENCOUNTER — Telehealth: Payer: Self-pay

## 2017-04-08 ENCOUNTER — Encounter: Payer: Self-pay | Admitting: Internal Medicine

## 2017-04-08 VITALS — BP 100/69 | HR 99 | Temp 97.5°F | Ht 65.0 in | Wt 161.0 lb

## 2017-04-08 DIAGNOSIS — M7041 Prepatellar bursitis, right knee: Secondary | ICD-10-CM | POA: Diagnosis not present

## 2017-04-08 DIAGNOSIS — M009 Pyogenic arthritis, unspecified: Secondary | ICD-10-CM | POA: Diagnosis not present

## 2017-04-08 DIAGNOSIS — Z471 Aftercare following joint replacement surgery: Secondary | ICD-10-CM | POA: Diagnosis not present

## 2017-04-08 NOTE — Progress Notes (Signed)
Patient ID: [name], female   DOB: May 23, 1940, 77 y.o.   MRN: [id number]  HPI [name] is a 77 y.o. female who has history of RA, severe rheumatoid arthritis - previous tx hx includes: methotrexate, Plaquenil, Arava, Remicade, prednisone, leflunomide, orencia, humira, enbrel, Rituxanfrom 2007-2017, but now on chronic prednisone at 5 mgandfollows with Dr. Neldon Mc, [facility]. Her rheumatoid arthritis is not felt to be active at this point.   She has history of bilateral TKA, with multiple (7-8 surgeries) to her right knee. She sustained ground level fall on 12/22 where she noticed a cut as well as clear fluid that drained from the injured area. Over the course of the next two days her knee became increasing red as well as painful with weight bearing.On the day of admit, on 12/30, she felt like she had chills. She was started on doxycycline by her pcp but still had no significant improvement.In the emergency room, she was found to have elevated white count of 15K with left shift, and x-rays indicated old fracture of the patella, large prepatellar effusion. She was admitted on 12/30 where Dr Tonita Cong performed I x D of the affected area for management of the infection, pre-patellar septic arthritis. She was started empirically on vancomycin. Her OR cultures are showing multiple gram positive organisms, mostly CoNS. First CoNS isolate is pansensitive but R tetracyline. She was discharged on 4 wks of vancomycin that was to end of Jan 27th. She started to notice worsening swelling and erythema to her right knee over the last week, where she was seen by Dr Wynelle Link again on Friday Jan 25th where they did an arthrocentesis ( I do not have labs of what cell count or if culture was done on this fluid). He mentioned to the patient that if they needed to go back to surgery for her right knee, he would remove HW and unlikely to place new joint (per patient report) In the meantime, I  extended her abtx for 2 addn weeks to be a total of 6 wk since she has septic arthritis, with plan to give oral abtx thereafter.  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 He pulsed lavage joint space but did not remove any hardware.  She is at the start of 5th week of IV abtx and her right knee is still impressively large, warm to touch, erythematous with reaccumulated effusion despite having arthrocentesis 4 days ago.  12/30 cx results Culture RARE STAPHYLOCOCCUS SPECIES (COAGULASE NEGATIVE)  RARE KOCURIA SPECIES  RARE DIPHTHEROIDS(CORYNEBACTERIUM SPECIES)  Standardized susceptibility testing for this organism is not available.  NO ANAEROBES ISOLATED      Coagulase negative staph isolate - pan sensitive except for R to tetracycline   Outpatient Encounter Medications as of 04/08/2017  Medication Sig  . Albuterol Sulfate (PROAIR RESPICLICK) 132 (90 Base) MCG/ACT AEPB Inhale 2 puffs into the lungs as needed (every four to six hours for cough or wheeze).  Marland Kitchen aspirin EC 81 MG tablet Take 81 mg by mouth 2 (two) times a week.   Marland Kitchen atorvastatin (LIPITOR) 40 MG tablet Take 40 mg by mouth at bedtime.   . beclomethasone (QVAR) 80 MCG/ACT inhaler Inhale 2 puffs into the lungs 2 (two) times daily.  . cholecalciferol (VITAMIN D) 1000 UNITS tablet Take 1,000 Units by mouth at bedtime.   Marland Kitchen  Cyanocobalamin (B-12) 2500 MCG TABS Take 2,500 mcg by mouth at bedtime.   Marland Kitchen doxycycline (VIBRA-TABS) 100 MG tablet Take 100 mg by mouth 2 (two) times daily. 10 day course started 03/06/17 pm  . DULoxetine (CYMBALTA) 60 MG capsule Take 120 mg by mouth at bedtime.   Marland Kitchen ezetimibe (ZETIA) 10 MG tablet Take 10 mg by mouth at bedtime.   . fluticasone furoate-vilanterol (BREO ELLIPTA) 200-25 MCG/INH AEPB Inhale 1 puff into the lungs daily.  Marland Kitchen gabapentin (NEURONTIN) 100 MG capsule Take 100  mg by mouth at bedtime.  Marland Kitchen loratadine (CLARITIN) 10 MG tablet Take 10 mg by mouth at bedtime.   . metFORMIN (GLUCOPHAGE) 500 MG tablet Take 1,000 mg by mouth at bedtime.   . montelukast (SINGULAIR) 10 MG tablet TAKE ONE TABLET BY MOUTH ONCE DAILY (Patient taking differently: TAKE ONE TABLET (10 MG) BY MOUTH ONCE DAILY AT BEDTIME)  . Multiple Vitamin (MULTIVITAMIN WITH MINERALS) TABS tablet Take 1 tablet by mouth at bedtime.  . Omega-3 Fatty Acids (FISH OIL) 1200 MG CAPS Take 1,200 mg by mouth at bedtime.   . pantoprazole (PROTONIX) 40 MG tablet Take one tablet once every morning (Patient taking differently: Take 40 mg by mouth at bedtime. )  . predniSONE (DELTASONE) 5 MG tablet Take 5 mg by mouth at bedtime. For rheumatoid arthritis  . ranitidine (ZANTAC) 150 MG tablet TAKE TWO TABLETS EVERY EVENING (Patient taking differently: Take 300 mg by mouth at bedtime. )  . linagliptin (TRADJENTA) 5 MG TABS tablet Take 5 mg by mouth at bedtime.   Marland Kitchen morphine (MSIR) 30 MG tablet Take 30 mg by mouth daily as needed for severe pain.  Marland Kitchen ondansetron (ZOFRAN) 4 MG tablet Take 1 tablet (4 mg total) by mouth every 6 (six) hours as needed for nausea. (Patient not taking: Reported on 03/14/2017)  . Oxycodone HCl 10 MG TABS Take 1 tablet (10 mg total) by mouth every 6 (six) hours as needed. (Patient not taking: Reported on 04/08/2017)   No facility-administered encounter medications on file as of 04/08/2017.      Patient Active Problem List   Diagnosis Date Noted  . Arthritis, septic, knee ([place]) 03/09/2017  . Septic prepatellar bursitis of right knee 10/26/2014  . Prepatellar bursitis of right knee 08/28/2014  . DYSPHAGIA ORAL PHASE 06/18/2007  . Asthma 05/14/2007  . SLEEP APNEA 05/14/2007  . GERD 05/13/2007  . Rheumatoid arthritis ([place]) 05/13/2007  . OSTEOPOROSIS 05/13/2007  . DIARRHEA, CHRONIC 05/13/2007     Health Maintenance Due  Topic Date Due  . HEMOGLOBIN A1C  October 06, 1940  . FOOT EXAM  08/02/1951  .  OPHTHALMOLOGY EXAM  08/02/1951  . URINE MICROALBUMIN  08/02/1951  . TETANUS/TDAP  08/01/1960  . COLONOSCOPY  08/02/1991  . DEXA SCAN  08/02/2006  . PNA vac Low Risk Adult (1 of 2 - PCV13) 08/02/2006     Review of Systems No fever, or chills, but increasing pain ,swelling ,and erythema to right knee. No diarrhea, signs of thrush, or difficulty with picc line Physical Exam   BP 100/69   Pulse 99   Temp (!) 97.5 F (36.4 C) (Oral)   Ht 5\' 5"  (1.651 m)   Wt 161 lb (73 kg)   BMI 26.79 kg/m  . Physical Exam  Constitutional:  oriented to person, place, and time. appears well-developed and well-nourished. No distress.  HENT: [place]/AT, PERRLA, no scleral icterus Mouth/Throat: Oropharynx is clear and moist. No oropharyngeal exudate.  Cardiovascular: Normal rate, regular rhythm and  normal heart sounds. Exam reveals no gallop and no friction rub.  No murmur heard.  Pulmonary/Chest: Effort normal and breath sounds normal. No respiratory distress.  has no wheezes.  Ext: right knee is significantly swollen with surrounding erythema, warmth to touch. No spontaneous drainage from incision Psychiatric: a normal mood and affect.  behavior is normal.   CBC Lab Results  Component Value Date   WBC 10.6 (H) 03/12/2017   RBC 4.22 03/12/2017   HGB 11.2 (L) 03/12/2017   HCT 35.3 (L) 03/12/2017   PLT 313 03/12/2017   MCV 83.6 03/12/2017   MCH 26.5 03/12/2017   MCHC 31.7 03/12/2017   RDW 15.8 (H) 03/12/2017   LYMPHSABS 1.1 03/09/2017   MONOABS 1.5 (H) 03/09/2017   EOSABS 0.1 03/09/2017    BMET Lab Results  Component Value Date   NA 140 03/12/2017   K 4.7 03/12/2017   CL 106 03/12/2017   CO2 26 03/12/2017   GLUCOSE 153 (H) 03/12/2017   BUN 31 (H) 03/12/2017   CREATININE 1.08 (H) 03/12/2017   CALCIUM 8.7 (L) 03/12/2017   GFRNONAA 49 (L) 03/12/2017   GFRAA 57 (L) 03/12/2017     Assessment and Plan  Prosthetic joint infection = initially started as pre-patellar bursitis from ground  level fall, found to have extended to joint. Currently on vancomycin for polymicrobial cx (CoNS, other gram positives). She is on 5th week of IV abtx and her knee appears worse. Will check sed rate, crp, cbc and bmp to also see how her kidneys are tolerating vanco  I have called over the dr aluisio's office to see her this afternoon as well as to procure results from last visit. Nonetheless, I suspect she needs a repeat I x D with the thought of having a more definitive plan of either 2 staged removal process with abtx spacer, but given her multiple surgeries to the knee -she would be at risk of difficulty with HW removal, shortening of leg, other complications that perhaps fusion is one of the limited options she has left  She asks for a second opinion and I mentioned that going to an academic center makes sense given many of her providers are already there and may see more complicated ortho cases such as herself.  Will call her over the next 2 days to see what has been done through dr aluisio's office. For now, will continue on vancomycin. Track down cx resutls from 1/25 to see if need to add GNR coverage.

## 2017-04-08 NOTE — Telephone Encounter (Signed)
Per Dr. Baxter Flattery I called [facility] to make an appointment for [name](has right Knee joint infection) with Dr. Wynelle Link for 04/09/17, however, after talking to one of the nurses at the office I was told that there were no open appointment until Friday. Later through the call the nurse told me they could see [name] today 04/08/16 at 5 o' clock. I left a voicemail stating that she currently has an appointment with Dr. Wynelle Link today at 5 and if she could not make this appointment to call us back or [place] at [phone].

## 2017-04-09 ENCOUNTER — Telehealth: Payer: Self-pay | Admitting: *Deleted

## 2017-04-09 LAB — CBC WITH DIFFERENTIAL/PLATELET
Basophils Absolute: 69 cells/uL (ref 0–200)
Basophils Relative: 0.5 %
EOS PCT: 4 %
Eosinophils Absolute: 548 cells/uL — ABNORMAL HIGH (ref 15–500)
HCT: 32.7 % — ABNORMAL LOW (ref 35.0–45.0)
Hemoglobin: 10.8 g/dL — ABNORMAL LOW (ref 11.7–15.5)
Lymphs Abs: 2069 cells/uL (ref 850–3900)
MCH: 26.2 pg — ABNORMAL LOW (ref 27.0–33.0)
MCHC: 33 g/dL (ref 32.0–36.0)
MCV: 79.4 fL — AB (ref 80.0–100.0)
MPV: 10.9 fL (ref 7.5–12.5)
Monocytes Relative: 9.5 %
NEUTROS PCT: 70.9 %
Neutro Abs: 9713 cells/uL — ABNORMAL HIGH (ref 1500–7800)
PLATELETS: 435 10*3/uL — AB (ref 140–400)
RBC: 4.12 10*6/uL (ref 3.80–5.10)
RDW: 14.5 % (ref 11.0–15.0)
TOTAL LYMPHOCYTE: 15.1 %
WBC: 13.7 10*3/uL — AB (ref 3.8–10.8)
WBCMIX: 1302 {cells}/uL — AB (ref 200–950)

## 2017-04-09 LAB — BASIC METABOLIC PANEL
BUN / CREAT RATIO: 22 (calc) (ref 6–22)
BUN: 25 mg/dL (ref 7–25)
CALCIUM: 9 mg/dL (ref 8.6–10.4)
CO2: 22 mmol/L (ref 20–32)
CREATININE: 1.16 mg/dL — AB (ref 0.60–0.93)
Chloride: 105 mmol/L (ref 98–110)
GLUCOSE: 125 mg/dL — AB (ref 65–99)
Potassium: 4.2 mmol/L (ref 3.5–5.3)
SODIUM: 139 mmol/L (ref 135–146)

## 2017-04-09 LAB — SEDIMENTATION RATE: Sed Rate: 74 mm/h — ABNORMAL HIGH (ref 0–30)

## 2017-04-09 LAB — C-REACTIVE PROTEIN: CRP: 61.8 mg/L — ABNORMAL HIGH (ref <8.0)

## 2017-04-09 NOTE — Telephone Encounter (Signed)
Patient called to update Dr Baxter Flattery. She has an appointment with [facility], Dr Lorri Frederick, on 2/1 at 10:45. She would like to have her labs and last office note sent to him. RN confirmed contact information, faxed it to Dr Lorri Frederick via [facility]. Landis Gandy, RN

## 2017-04-09 NOTE — Telephone Encounter (Signed)
Will do!

## 2017-04-10 ENCOUNTER — Ambulatory Visit: Payer: Medicare Other | Admitting: Internal Medicine

## 2017-04-10 DIAGNOSIS — M06861 Other specified rheumatoid arthritis, right knee: Secondary | ICD-10-CM | POA: Diagnosis not present

## 2017-04-10 DIAGNOSIS — M71161 Other infective bursitis, right knee: Secondary | ICD-10-CM | POA: Diagnosis not present

## 2017-04-10 DIAGNOSIS — E119 Type 2 diabetes mellitus without complications: Secondary | ICD-10-CM | POA: Diagnosis not present

## 2017-04-10 DIAGNOSIS — J439 Emphysema, unspecified: Secondary | ICD-10-CM | POA: Diagnosis not present

## 2017-04-10 DIAGNOSIS — M069 Rheumatoid arthritis, unspecified: Secondary | ICD-10-CM | POA: Diagnosis not present

## 2017-04-10 DIAGNOSIS — M00861 Arthritis due to other bacteria, right knee: Secondary | ICD-10-CM | POA: Diagnosis not present

## 2017-04-10 DIAGNOSIS — M797 Fibromyalgia: Secondary | ICD-10-CM | POA: Diagnosis not present

## 2017-04-11 ENCOUNTER — Other Ambulatory Visit: Payer: Self-pay | Admitting: Pharmacist

## 2017-04-11 DIAGNOSIS — M25561 Pain in right knee: Secondary | ICD-10-CM | POA: Diagnosis not present

## 2017-04-11 DIAGNOSIS — T8453XA Infection and inflammatory reaction due to internal right knee prosthesis, initial encounter: Secondary | ICD-10-CM | POA: Diagnosis not present

## 2017-04-11 DIAGNOSIS — S82031A Displaced transverse fracture of right patella, initial encounter for closed fracture: Secondary | ICD-10-CM | POA: Diagnosis not present

## 2017-04-11 DIAGNOSIS — G8929 Other chronic pain: Secondary | ICD-10-CM | POA: Diagnosis not present

## 2017-04-11 DIAGNOSIS — Z96659 Presence of unspecified artificial knee joint: Secondary | ICD-10-CM | POA: Diagnosis not present

## 2017-04-11 DIAGNOSIS — T8459XA Infection and inflammatory reaction due to other internal joint prosthesis, initial encounter: Secondary | ICD-10-CM | POA: Diagnosis not present

## 2017-04-14 DIAGNOSIS — M00861 Arthritis due to other bacteria, right knee: Secondary | ICD-10-CM | POA: Diagnosis not present

## 2017-04-14 DIAGNOSIS — M069 Rheumatoid arthritis, unspecified: Secondary | ICD-10-CM | POA: Diagnosis not present

## 2017-04-14 DIAGNOSIS — E119 Type 2 diabetes mellitus without complications: Secondary | ICD-10-CM | POA: Diagnosis not present

## 2017-04-14 DIAGNOSIS — J439 Emphysema, unspecified: Secondary | ICD-10-CM | POA: Diagnosis not present

## 2017-04-14 DIAGNOSIS — M797 Fibromyalgia: Secondary | ICD-10-CM | POA: Diagnosis not present

## 2017-04-14 DIAGNOSIS — M71161 Other infective bursitis, right knee: Secondary | ICD-10-CM | POA: Diagnosis not present

## 2017-04-15 DIAGNOSIS — E119 Type 2 diabetes mellitus without complications: Secondary | ICD-10-CM | POA: Diagnosis not present

## 2017-04-15 DIAGNOSIS — M71161 Other infective bursitis, right knee: Secondary | ICD-10-CM | POA: Diagnosis not present

## 2017-04-15 DIAGNOSIS — M00861 Arthritis due to other bacteria, right knee: Secondary | ICD-10-CM | POA: Diagnosis not present

## 2017-04-15 DIAGNOSIS — M797 Fibromyalgia: Secondary | ICD-10-CM | POA: Diagnosis not present

## 2017-04-15 DIAGNOSIS — M069 Rheumatoid arthritis, unspecified: Secondary | ICD-10-CM | POA: Diagnosis not present

## 2017-04-15 DIAGNOSIS — J439 Emphysema, unspecified: Secondary | ICD-10-CM | POA: Diagnosis not present

## 2017-04-16 DIAGNOSIS — M06071 Rheumatoid arthritis without rheumatoid factor, right ankle and foot: Secondary | ICD-10-CM | POA: Diagnosis not present

## 2017-04-16 DIAGNOSIS — M797 Fibromyalgia: Secondary | ICD-10-CM | POA: Diagnosis not present

## 2017-04-16 DIAGNOSIS — Z794 Long term (current) use of insulin: Secondary | ICD-10-CM | POA: Insufficient documentation

## 2017-04-16 DIAGNOSIS — E119 Type 2 diabetes mellitus without complications: Secondary | ICD-10-CM | POA: Diagnosis not present

## 2017-04-16 DIAGNOSIS — J849 Interstitial pulmonary disease, unspecified: Secondary | ICD-10-CM | POA: Insufficient documentation

## 2017-04-16 DIAGNOSIS — D801 Nonfamilial hypogammaglobulinemia: Secondary | ICD-10-CM | POA: Diagnosis not present

## 2017-04-16 DIAGNOSIS — N183 Chronic kidney disease, stage 3 unspecified: Secondary | ICD-10-CM | POA: Insufficient documentation

## 2017-04-16 DIAGNOSIS — Z01818 Encounter for other preprocedural examination: Secondary | ICD-10-CM | POA: Diagnosis not present

## 2017-04-16 DIAGNOSIS — G4730 Sleep apnea, unspecified: Secondary | ICD-10-CM | POA: Diagnosis not present

## 2017-04-16 DIAGNOSIS — M06072 Rheumatoid arthritis without rheumatoid factor, left ankle and foot: Secondary | ICD-10-CM | POA: Diagnosis not present

## 2017-04-16 DIAGNOSIS — M47812 Spondylosis without myelopathy or radiculopathy, cervical region: Secondary | ICD-10-CM | POA: Diagnosis not present

## 2017-04-16 DIAGNOSIS — K219 Gastro-esophageal reflux disease without esophagitis: Secondary | ICD-10-CM | POA: Diagnosis not present

## 2017-04-16 DIAGNOSIS — J45909 Unspecified asthma, uncomplicated: Secondary | ICD-10-CM | POA: Diagnosis not present

## 2017-04-16 DIAGNOSIS — E7849 Other hyperlipidemia: Secondary | ICD-10-CM | POA: Diagnosis not present

## 2017-04-16 DIAGNOSIS — M50322 Other cervical disc degeneration at C5-C6 level: Secondary | ICD-10-CM | POA: Diagnosis not present

## 2017-04-16 DIAGNOSIS — D649 Anemia, unspecified: Secondary | ICD-10-CM | POA: Diagnosis not present

## 2017-04-16 DIAGNOSIS — M00869 Arthritis due to other bacteria, unspecified knee: Secondary | ICD-10-CM | POA: Diagnosis not present

## 2017-04-16 LAB — FUNGUS CULTURE RESULT

## 2017-04-16 LAB — FUNGUS CULTURE WITH STAIN

## 2017-04-16 LAB — FUNGAL ORGANISM REFLEX

## 2017-04-17 ENCOUNTER — Ambulatory Visit (INDEPENDENT_AMBULATORY_CARE_PROVIDER_SITE_OTHER): Payer: Medicare Other | Admitting: Sports Medicine

## 2017-04-17 ENCOUNTER — Encounter: Payer: Self-pay | Admitting: Sports Medicine

## 2017-04-17 DIAGNOSIS — M71161 Other infective bursitis, right knee: Secondary | ICD-10-CM | POA: Diagnosis not present

## 2017-04-17 DIAGNOSIS — M069 Rheumatoid arthritis, unspecified: Secondary | ICD-10-CM | POA: Diagnosis not present

## 2017-04-17 DIAGNOSIS — J439 Emphysema, unspecified: Secondary | ICD-10-CM | POA: Diagnosis not present

## 2017-04-17 DIAGNOSIS — M00861 Arthritis due to other bacteria, right knee: Secondary | ICD-10-CM | POA: Diagnosis not present

## 2017-04-17 DIAGNOSIS — E1142 Type 2 diabetes mellitus with diabetic polyneuropathy: Secondary | ICD-10-CM

## 2017-04-17 DIAGNOSIS — M797 Fibromyalgia: Secondary | ICD-10-CM | POA: Diagnosis not present

## 2017-04-17 DIAGNOSIS — E119 Type 2 diabetes mellitus without complications: Secondary | ICD-10-CM | POA: Diagnosis not present

## 2017-04-17 DIAGNOSIS — Q828 Other specified congenital malformations of skin: Secondary | ICD-10-CM

## 2017-04-17 DIAGNOSIS — M79676 Pain in unspecified toe(s): Secondary | ICD-10-CM | POA: Diagnosis not present

## 2017-04-17 DIAGNOSIS — B351 Tinea unguium: Secondary | ICD-10-CM | POA: Diagnosis not present

## 2017-04-17 NOTE — Progress Notes (Signed)
Subjective: [name] is a 77 y.o. female patient with history of diabetes who returns to office today complaining of callus pain and long, painful nails  while ambulating in shoes; unable to trim. 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 Patient denies any other issues at this time.   Patient Active Problem List   Diagnosis Date Noted  . 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 Septic prepatellar bursitis of right knee 10/26/2014  . Prepatellar bursitis of right knee 08/28/2014  . DYSPHAGIA ORAL PHASE 06/18/2007  . Asthma 05/14/2007  . SLEEP APNEA 05/14/2007  . GERD 05/13/2007  . Rheumatoid arthritis ([place]) 05/13/2007  . OSTEOPOROSIS 05/13/2007  . DIARRHEA, CHRONIC 05/13/2007   Current Outpatient Medications on File Prior to Visit  Medication Sig Dispense Refill  . Albuterol Sulfate (PROAIR RESPICLICK) 657 (90 Base) MCG/ACT AEPB Inhale 2 puffs into the lungs as needed (every four to six hours for cough or wheeze). 1 each 1  . aspirin EC 81 MG tablet Take 81 mg by mouth 2 (two) times a week.     Marland Kitchen atorvastatin (LIPITOR) 40 MG tablet Take 40 mg by mouth at bedtime.     . beclomethasone (QVAR) 80 MCG/ACT inhaler Inhale 2 puffs into the lungs 2 (two) times daily. 1 Inhaler 5  . cholecalciferol (VITAMIN D) 1000 UNITS tablet Take 1,000 Units by mouth at bedtime.     . Cyanocobalamin (B-12) 2500 MCG TABS Take 2,500 mcg by mouth at bedtime.     Marland Kitchen doxycycline (VIBRA-TABS) 100 MG tablet Take 100 mg by mouth 2 (two) times daily. 10 day course started 03/06/17 pm    . DULoxetine (CYMBALTA) 60 MG capsule Take 120 mg by mouth at bedtime.     Marland Kitchen ezetimibe (ZETIA) 10 MG tablet Take 10 mg by mouth at bedtime.     . fluticasone furoate-vilanterol (BREO ELLIPTA) 200-25 MCG/INH AEPB Inhale 1 puff into the lungs daily. 60 each 5  . gabapentin (NEURONTIN) 100 MG capsule Take 100 mg by mouth at  bedtime.    Marland Kitchen linagliptin (TRADJENTA) 5 MG TABS tablet Take 5 mg by mouth at bedtime.     Marland Kitchen loratadine (CLARITIN) 10 MG tablet Take 10 mg by mouth at bedtime.     . metFORMIN (GLUCOPHAGE) 500 MG tablet Take 1,000 mg by mouth at bedtime.     . montelukast (SINGULAIR) 10 MG tablet TAKE ONE TABLET BY MOUTH ONCE DAILY (Patient taking differently: TAKE ONE TABLET (10 MG) BY MOUTH ONCE DAILY AT BEDTIME) 30 tablet 0  . morphine (MSIR) 30 MG tablet Take 30 mg by mouth daily as needed for severe pain.    . Multiple Vitamin (MULTIVITAMIN WITH MINERALS) TABS tablet Take 1 tablet by mouth at bedtime.    . Omega-3 Fatty Acids (FISH OIL) 1200 MG CAPS Take 1,200 mg by mouth at bedtime.     . ondansetron (ZOFRAN) 4 MG tablet Take 1 tablet (4 mg total) by mouth every 6 (six) hours as needed for nausea. (Patient not taking: Reported on 03/14/2017) 20 tablet 0  . Oxycodone HCl 10 MG TABS Take 1 tablet (10 mg total) by mouth every 6 (six) hours as needed. (Patient not taking: Reported on 04/08/2017) 15 tablet 0  . pantoprazole (PROTONIX) 40 MG tablet Take one tablet once every morning (Patient taking differently: Take 40 mg by mouth at bedtime. ) 30 tablet  5  . predniSONE (DELTASONE) 5 MG tablet Take 5 mg by mouth at bedtime. For rheumatoid arthritis    . ranitidine (ZANTAC) 150 MG tablet TAKE TWO TABLETS EVERY EVENING (Patient taking differently: Take 300 mg by mouth at bedtime. ) 180 tablet 1   No current facility-administered medications on file prior to visit.    Allergies  Allergen Reactions  . Penicillins Hives    Has patient had a PCN reaction causing immediate rash, facial/tongue/throat swelling, SOB or lightheadedness with hypotension: Yes Has patient had a PCN reaction causing severe rash involving mucus membranes or skin necrosis: No Has patient had a PCN reaction that required hospitalization: No Has patient had a PCN reaction occurring within the last 10 years: No If all of the above answers are "NO",  then may proceed with Cephalosporin use.  . Sulfonamide Derivatives Nausea And Vomiting    REACTION: stomach problems    Recent Results (from the past 2160 hour(s))  CBC with Differential/Platelet     Status: Abnormal   Collection Time: 03/09/17  3:38 PM  Result Value Ref Range   WBC 15.0 (H) 4.0 - 10.5 K/uL   RBC 4.56 3.87 - 5.11 MIL/uL   Hemoglobin 12.1 12.0 - 15.0 g/dL   HCT 38.6 36.0 - 46.0 %   MCV 84.6 78.0 - 100.0 fL   MCH 26.5 26.0 - 34.0 pg   MCHC 31.3 30.0 - 36.0 g/dL   RDW 16.2 (H) 11.5 - 15.5 %   Platelets 272 150 - 400 K/uL   Neutrophils Relative % 82 %   Neutro Abs 12.3 (H) 1.7 - 7.7 K/uL   Lymphocytes Relative 8 %   Lymphs Abs 1.1 0.7 - 4.0 K/uL   Monocytes Relative 10 %   Monocytes Absolute 1.5 (H) 0.1 - 1.0 K/uL   Eosinophils Relative 0 %   Eosinophils Absolute 0.1 0.0 - 0.7 K/uL   Basophils Relative 0 %   Basophils Absolute 0.0 0.0 - 0.1 K/uL  Basic metabolic panel     Status: Abnormal   Collection Time: 03/09/17  3:38 PM  Result Value Ref Range   Sodium 135 135 - 145 mmol/L   Potassium 4.0 3.5 - 5.1 mmol/L   Chloride 105 101 - 111 mmol/L   CO2 21 (L) 22 - 32 mmol/L   Glucose, Bld 110 (H) 65 - 99 mg/dL   BUN 20 6 - 20 mg/dL   Creatinine, Ser 1.10 (H) 0.44 - 1.00 mg/dL   Calcium 8.6 (L) 8.9 - 10.3 mg/dL   GFR calc non Af Amer 48 (L) >60 mL/min   GFR calc Af Amer 55 (L) >60 mL/min    Comment: (NOTE) The eGFR has been calculated using the CKD EPI equation. This calculation has not been validated in all clinical situations. eGFR's persistently <60 mL/min signify possible Chronic Kidney Disease.    Anion gap 9 5 - 15  Sedimentation rate     Status: Abnormal   Collection Time: 03/09/17  3:38 PM  Result Value Ref Range   Sed Rate 50 (H) 0 - 22 mm/hr  C-reactive protein     Status: Abnormal   Collection Time: 03/09/17  3:38 PM  Result Value Ref Range   CRP 19.4 (H) <1.0 mg/dL  I-Stat CG4 Lactic Acid, ED     Status: None   Collection Time: 03/09/17   3:51 PM  Result Value Ref Range   Lactic Acid, Venous 1.18 0.5 - 1.9 mmol/L  Fungus Culture With Stain  Status: None   Collection Time: 03/09/17  8:22 PM  Result Value Ref Range   Fungus Stain Final report    Fungus (Mycology) Culture Final report     Comment: (NOTE) Performed At: [facility] [place], [place] [id number] Rush Farmer MD [id number]    Fungal Source RIGHT     Comment: KNEE  Aerobic/Anaerobic Culture (surgical/deep wound)     Status: None   Collection Time: 03/09/17  8:22 PM  Result Value Ref Range   Specimen Description WOUND RIGHT KNEE    Special Requests NONE    Gram Stain      FEW WBC PRESENT, PREDOMINANTLY PMN NO ORGANISMS SEEN    Culture      RARE STAPHYLOCOCCUS SPECIES (COAGULASE NEGATIVE) RARE KOCURIA SPECIES RARE DIPHTHEROIDS(CORYNEBACTERIUM SPECIES) Standardized susceptibility testing for this organism is not available. NO ANAEROBES ISOLATED    Report Status 03/15/2017 FINAL    Organism ID, Bacteria STAPHYLOCOCCUS SPECIES (COAGULASE NEGATIVE)       Susceptibility   Staphylococcus species (coagulase negative) - MIC*    CIPROFLOXACIN <=0.5 SENSITIVE Sensitive     ERYTHROMYCIN 0.5 SENSITIVE Sensitive     GENTAMICIN <=0.5 SENSITIVE Sensitive     OXACILLIN <=0.25 SENSITIVE Sensitive     TETRACYCLINE >=16 RESISTANT Resistant     VANCOMYCIN <=0.5 SENSITIVE Sensitive     TRIMETH/SULFA <=10 SENSITIVE Sensitive     CLINDAMYCIN <=0.25 SENSITIVE Sensitive     RIFAMPIN <=0.5 SENSITIVE Sensitive     Inducible Clindamycin NEGATIVE Sensitive     * RARE STAPHYLOCOCCUS SPECIES (COAGULASE NEGATIVE)  Fungus Culture Result     Status: None   Collection Time: 03/09/17  8:22 PM  Result Value Ref Range   Result 1 Comment     Comment: (NOTE) KOH/Calcofluor preparation:  no fungus observed. Performed At: [facility] [place], [place] [id number] Rush Farmer MD [id number]   Fungal organism reflex      Status: None   Collection Time: 03/09/17  8:22 PM  Result Value Ref Range   Fungal result 1 Comment     Comment: (NOTE) No yeast or mold isolated after 4 weeks. Performed At: [facility] [place], [place] [id number] Rush Farmer MD [id number]   Glucose, capillary     Status: Abnormal   Collection Time: 03/09/17  9:06 PM  Result Value Ref Range   Glucose-Capillary 144 (H) 65 - 99 mg/dL  Glucose, capillary     Status: Abnormal   Collection Time: 03/09/17 11:36 PM  Result Value Ref Range   Glucose-Capillary 199 (H) 65 - 99 mg/dL  Basic metabolic panel     Status: Abnormal   Collection Time: 03/10/17  3:36 AM  Result Value Ref Range   Sodium 134 (L) 135 - 145 mmol/L   Potassium 3.9 3.5 - 5.1 mmol/L   Chloride 103 101 - 111 mmol/L   CO2 20 (L) 22 - 32 mmol/L   Glucose, Bld 276 (H) 65 - 99 mg/dL   BUN 25 (H) 6 - 20 mg/dL   Creatinine, Ser 1.20 (H) 0.44 - 1.00 mg/dL   Calcium 8.5 (L) 8.9 - 10.3 mg/dL   GFR calc non Af Amer 43 (L) >60 mL/min   GFR calc Af Amer 50 (L) >60 mL/min    Comment: (NOTE) The eGFR has been calculated using the CKD EPI equation. This calculation has not been validated in all clinical situations. eGFR's persistently <60 mL/min signify possible Chronic Kidney Disease.  Anion gap 11 5 - 15  CBC     Status: Abnormal   Collection Time: 03/10/17  3:36 AM  Result Value Ref Range   WBC 10.7 (H) 4.0 - 10.5 K/uL   RBC 4.38 3.87 - 5.11 MIL/uL   Hemoglobin 11.5 (L) 12.0 - 15.0 g/dL   HCT 37.2 36.0 - 46.0 %   MCV 84.9 78.0 - 100.0 fL   MCH 26.3 26.0 - 34.0 pg   MCHC 30.9 30.0 - 36.0 g/dL   RDW 16.2 (H) 11.5 - 15.5 %   Platelets 268 150 - 400 K/uL  Glucose, capillary     Status: Abnormal   Collection Time: 03/10/17  7:35 AM  Result Value Ref Range   Glucose-Capillary 252 (H) 65 - 99 mg/dL   Comment 1 Notify RN   Glucose, capillary     Status: Abnormal   Collection Time: 03/10/17 12:11 PM  Result Value Ref Range    Glucose-Capillary 161 (H) 65 - 99 mg/dL   Comment 1 Notify RN   Glucose, capillary     Status: Abnormal   Collection Time: 03/10/17  5:17 PM  Result Value Ref Range   Glucose-Capillary 162 (H) 65 - 99 mg/dL   Comment 1 Notify RN   Glucose, capillary     Status: Abnormal   Collection Time: 03/10/17  9:30 PM  Result Value Ref Range   Glucose-Capillary 181 (H) 65 - 99 mg/dL   Comment 1 Notify RN    Comment 2 Document in Chart   CBC     Status: Abnormal   Collection Time: 03/11/17  3:36 AM  Result Value Ref Range   WBC 14.0 (H) 4.0 - 10.5 K/uL   RBC 4.12 3.87 - 5.11 MIL/uL   Hemoglobin 10.7 (L) 12.0 - 15.0 g/dL   HCT 34.4 (L) 36.0 - 46.0 %   MCV 83.5 78.0 - 100.0 fL   MCH 26.0 26.0 - 34.0 pg   MCHC 31.1 30.0 - 36.0 g/dL   RDW 16.0 (H) 11.5 - 15.5 %   Platelets 325 150 - 400 K/uL  Glucose, capillary     Status: Abnormal   Collection Time: 03/11/17  7:48 AM  Result Value Ref Range   Glucose-Capillary 154 (H) 65 - 99 mg/dL   Comment 1 Notify RN   Glucose, capillary     Status: Abnormal   Collection Time: 03/11/17 12:17 PM  Result Value Ref Range   Glucose-Capillary 134 (H) 65 - 99 mg/dL   Comment 1 Notify RN   Glucose, capillary     Status: Abnormal   Collection Time: 03/11/17  5:05 PM  Result Value Ref Range   Glucose-Capillary 111 (H) 65 - 99 mg/dL  CBC     Status: Abnormal   Collection Time: 03/12/17  6:20 AM  Result Value Ref Range   WBC 10.6 (H) 4.0 - 10.5 K/uL   RBC 4.22 3.87 - 5.11 MIL/uL   Hemoglobin 11.2 (L) 12.0 - 15.0 g/dL   HCT 35.3 (L) 36.0 - 46.0 %   MCV 83.6 78.0 - 100.0 fL   MCH 26.5 26.0 - 34.0 pg   MCHC 31.7 30.0 - 36.0 g/dL   RDW 15.8 (H) 11.5 - 15.5 %   Platelets 313 150 - 400 K/uL  Basic metabolic panel     Status: Abnormal   Collection Time: 03/12/17  6:20 AM  Result Value Ref Range   Sodium 140 135 - 145 mmol/L   Potassium 4.7 3.5 - 5.1  mmol/L   Chloride 106 101 - 111 mmol/L   CO2 26 22 - 32 mmol/L   Glucose, Bld 153 (H) 65 - 99 mg/dL   BUN  31 (H) 6 - 20 mg/dL   Creatinine, Ser 1.08 (H) 0.44 - 1.00 mg/dL   Calcium 8.7 (L) 8.9 - 10.3 mg/dL   GFR calc non Af Amer 49 (L) >60 mL/min   GFR calc Af Amer 57 (L) >60 mL/min    Comment: (NOTE) The eGFR has been calculated using the CKD EPI equation. This calculation has not been validated in all clinical situations. eGFR's persistently <60 mL/min signify possible Chronic Kidney Disease.    Anion gap 8 5 - 15  Vancomycin, trough     Status: None   Collection Time: 03/12/17  6:20 AM  Result Value Ref Range   Vancomycin Tr 17 15 - 20 ug/mL  Glucose, capillary     Status: Abnormal   Collection Time: 03/12/17  7:34 AM  Result Value Ref Range   Glucose-Capillary 138 (H) 65 - 99 mg/dL  Glucose, capillary     Status: None   Collection Time: 03/12/17 12:33 PM  Result Value Ref Range   Glucose-Capillary 85 65 - 99 mg/dL  Glucose, capillary     Status: None   Collection Time: 03/12/17  5:49 PM  Result Value Ref Range   Glucose-Capillary 94 65 - 99 mg/dL  Glucose, capillary     Status: Abnormal   Collection Time: 03/12/17  9:10 PM  Result Value Ref Range   Glucose-Capillary 180 (H) 65 - 99 mg/dL   Comment 1 Notify RN    Comment 2 Document in Chart   Glucose, capillary     Status: Abnormal   Collection Time: 03/13/17  7:18 AM  Result Value Ref Range   Glucose-Capillary 135 (H) 65 - 99 mg/dL  Glucose, capillary     Status: Abnormal   Collection Time: 03/13/17 11:35 AM  Result Value Ref Range   Glucose-Capillary 115 (H) 65 - 99 mg/dL  Basic metabolic panel     Status: Abnormal   Collection Time: 04/08/17 10:25 AM  Result Value Ref Range   Glucose, Bld 125 (H) 65 - 99 mg/dL    Comment: .            Fasting reference interval . For someone without known diabetes, a glucose value between 100 and 125 mg/dL is consistent with prediabetes and should be confirmed with a follow-up test. .    BUN 25 7 - 25 mg/dL   Creat 1.16 (H) 0.60 - 0.93 mg/dL    Comment: For patients >78  years of age, the reference limit for Creatinine is approximately 13% higher for people identified as African-American. .    BUN/Creatinine Ratio 22 6 - 22 (calc)   Sodium 139 135 - 146 mmol/L   Potassium 4.2 3.5 - 5.3 mmol/L   Chloride 105 98 - 110 mmol/L   CO2 22 20 - 32 mmol/L   Calcium 9.0 8.6 - 10.4 mg/dL  CBC with Differential/Platelet     Status: Abnormal   Collection Time: 04/08/17 10:25 AM  Result Value Ref Range   WBC 13.7 (H) 3.8 - 10.8 Thousand/uL   RBC 4.12 3.80 - 5.10 Million/uL   Hemoglobin 10.8 (L) 11.7 - 15.5 g/dL   HCT 32.7 (L) 35.0 - 45.0 %   MCV 79.4 (L) 80.0 - 100.0 fL   MCH 26.2 (L) 27.0 - 33.0 pg   MCHC 33.0 32.0 -  36.0 g/dL   RDW 14.5 11.0 - 15.0 %   Platelets 435 (H) 140 - 400 Thousand/uL   MPV 10.9 7.5 - 12.5 fL   Neutro Abs 9,713 (H) 1,500 - 7,800 cells/uL   Lymphs Abs 2,069 850 - 3,900 cells/uL   WBC mixed population 1,302 (H) 200 - 950 cells/uL   Eosinophils Absolute 548 (H) 15 - 500 cells/uL   Basophils Absolute 69 0 - 200 cells/uL   Neutrophils Relative % 70.9 %   Total Lymphocyte 15.1 %   Monocytes Relative 9.5 %   Eosinophils Relative 4.0 %   Basophils Relative 0.5 %  C-reactive protein     Status: Abnormal   Collection Time: 04/08/17 10:25 AM  Result Value Ref Range   CRP 61.8 (H) <8.0 mg/L  Sedimentation rate     Status: Abnormal   Collection Time: 04/08/17 10:25 AM  Result Value Ref Range   Sed Rate 74 (H) 0 - 30 mm/h    Objective: General: Patient is awake, alert, and oriented x 3 and in no acute distress.  Integument: Skin is warm, dry and supple bilateral. Nails are tender, long, thickened and  dystrophic with subungual debris, consistent with onychomycosis, 1-5 bilateral. No signs of infection. Callus plantar medial arch on left foot and medial left third toe and right fourth toe distal tuft, Remaining integument unremarkable.  Vasculature:  Dorsalis Pedis pulse 1/4 bilateral. Posterior Tibial pulse  1/4 bilateral.  Capillary  fill time <3 sec 1-5 bilateral. No hair growth to the level of the digits. Temperature gradient within normal limits. + varicosities present bilateral. Trace edema present bilateral.   Neurology: The patient has diminished sensation measured with a 5.07/10g Semmes Weinstein Monofilament at all pedal sites bilateral . Vibratory sensation diminished bilateral with tuning fork. No Babinski sign present bilateral.   Musculoskeletal: Severely dislocated lesser metatarsophalangeal joints and digital deformities secondary to rheumatoid arthritis, there is significant pes planus deformity with plantar bony prominences specifically at the navicular of the left foot. Muscular strength 4/5 in all lower extremity muscular groups bilateral without pain on range of motion . No tenderness with calf compression bilateral.  Assessment and Plan: Problem List Items Addressed This Visit      Musculoskeletal and Integument   Rheumatoid arthritis ([place])    Other Visit Diagnoses    Dermatophytosis of nail    -  Primary   Pain of toe, unspecified laterality       Porokeratosis       Diabetic polyneuropathy associated with type 2 diabetes mellitus ([place])         -Examined patient. -Discussed and educated patient on diabetic foot care, especially with  regards to the vascular, neurological and musculoskeletal systems.  -Stressed the importance of good glycemic control and the detriment of not  controlling glucose levels in relation to the foot. -Mechanically Callus 2 using rotary bur and debrided all nails 1-5 bilateral using sterile nail nipper and filed with dremel without incident.  Dispense toe cushion to use as instructed. -Recommend good supportive shoes daily for foot type -Answered all patient questions -Patient to return  in 2-2.5 months for at risk foot care -Patient advised to call the office if any problems or questions arise in the meantime.  Landis Martins, DPM

## 2017-04-21 ENCOUNTER — Telehealth: Payer: Self-pay | Admitting: *Deleted

## 2017-04-21 ENCOUNTER — Other Ambulatory Visit: Payer: Self-pay | Admitting: Pharmacist

## 2017-04-21 DIAGNOSIS — M00861 Arthritis due to other bacteria, right knee: Secondary | ICD-10-CM | POA: Diagnosis not present

## 2017-04-21 DIAGNOSIS — M069 Rheumatoid arthritis, unspecified: Secondary | ICD-10-CM | POA: Diagnosis not present

## 2017-04-21 DIAGNOSIS — M71161 Other infective bursitis, right knee: Secondary | ICD-10-CM | POA: Diagnosis not present

## 2017-04-21 DIAGNOSIS — J439 Emphysema, unspecified: Secondary | ICD-10-CM | POA: Diagnosis not present

## 2017-04-21 DIAGNOSIS — M797 Fibromyalgia: Secondary | ICD-10-CM | POA: Diagnosis not present

## 2017-04-21 DIAGNOSIS — E119 Type 2 diabetes mellitus without complications: Secondary | ICD-10-CM | POA: Diagnosis not present

## 2017-04-21 NOTE — Telephone Encounter (Signed)
Patient called and advised she is getting her last dose of IV medication today and wants to know if the PICC can be D/C. She advised she has surgery to remove the infected joint 04/23/17 with Dr Lorri Frederick. Advised will have to call the doctor and ask as her stop/pull date not readily available.  Paged Dr Baxter Flattery and advised of last dose and surgery info and she gave verbal to D/C PICC. She also advised to give the patient Bactrim DS 1 tablet daily for 5 days. Order repeated and verified.  Called the patient and her nurse and gave verbal per Baxter Flattery to D/C the PICC today after last dose and start oral medication. Nurse verbalized understanding. The patient advised she will not take the medication as she was told by surgeon she does not need to take any antibiotics after she finished the IV. Advised her will let the doctor know. She also advised if Dt Baxter Flattery really wants her to take the medication to have her call Dr Lorri Frederick office to discuss with him and call her back if she should take the medication. Advised will let Dr Baxter Flattery know.  Dr Lorri Frederick [phone]

## 2017-04-23 DIAGNOSIS — Z7952 Long term (current) use of systemic steroids: Secondary | ICD-10-CM | POA: Diagnosis not present

## 2017-04-23 DIAGNOSIS — M06071 Rheumatoid arthritis without rheumatoid factor, right ankle and foot: Secondary | ICD-10-CM | POA: Diagnosis not present

## 2017-04-23 DIAGNOSIS — Z79899 Other long term (current) drug therapy: Secondary | ICD-10-CM | POA: Diagnosis not present

## 2017-04-23 DIAGNOSIS — J449 Chronic obstructive pulmonary disease, unspecified: Secondary | ICD-10-CM | POA: Diagnosis present

## 2017-04-23 DIAGNOSIS — E7849 Other hyperlipidemia: Secondary | ICD-10-CM | POA: Diagnosis present

## 2017-04-23 DIAGNOSIS — G4733 Obstructive sleep apnea (adult) (pediatric): Secondary | ICD-10-CM | POA: Diagnosis present

## 2017-04-23 DIAGNOSIS — Z7984 Long term (current) use of oral hypoglycemic drugs: Secondary | ICD-10-CM | POA: Diagnosis not present

## 2017-04-23 DIAGNOSIS — G4730 Sleep apnea, unspecified: Secondary | ICD-10-CM | POA: Diagnosis not present

## 2017-04-23 DIAGNOSIS — M00869 Arthritis due to other bacteria, unspecified knee: Secondary | ICD-10-CM | POA: Diagnosis not present

## 2017-04-23 DIAGNOSIS — D649 Anemia, unspecified: Secondary | ICD-10-CM | POA: Diagnosis not present

## 2017-04-23 DIAGNOSIS — Z471 Aftercare following joint replacement surgery: Secondary | ICD-10-CM | POA: Diagnosis not present

## 2017-04-23 DIAGNOSIS — Z96653 Presence of artificial knee joint, bilateral: Secondary | ICD-10-CM | POA: Diagnosis present

## 2017-04-23 DIAGNOSIS — M06072 Rheumatoid arthritis without rheumatoid factor, left ankle and foot: Secondary | ICD-10-CM | POA: Diagnosis not present

## 2017-04-23 DIAGNOSIS — Z7982 Long term (current) use of aspirin: Secondary | ICD-10-CM | POA: Diagnosis not present

## 2017-04-23 DIAGNOSIS — S82001B Unspecified fracture of right patella, initial encounter for open fracture type I or II: Secondary | ICD-10-CM | POA: Diagnosis not present

## 2017-04-23 DIAGNOSIS — Z880 Allergy status to penicillin: Secondary | ICD-10-CM | POA: Diagnosis not present

## 2017-04-23 DIAGNOSIS — M797 Fibromyalgia: Secondary | ICD-10-CM | POA: Diagnosis not present

## 2017-04-23 DIAGNOSIS — Z9181 History of falling: Secondary | ICD-10-CM | POA: Diagnosis not present

## 2017-04-23 DIAGNOSIS — T451X5A Adverse effect of antineoplastic and immunosuppressive drugs, initial encounter: Secondary | ICD-10-CM | POA: Diagnosis present

## 2017-04-23 DIAGNOSIS — Z7951 Long term (current) use of inhaled steroids: Secondary | ICD-10-CM | POA: Diagnosis not present

## 2017-04-23 DIAGNOSIS — T84126A Displacement of internal fixation device of bone of right lower leg, initial encounter: Secondary | ICD-10-CM | POA: Diagnosis present

## 2017-04-23 DIAGNOSIS — J45909 Unspecified asthma, uncomplicated: Secondary | ICD-10-CM | POA: Diagnosis not present

## 2017-04-23 DIAGNOSIS — D801 Nonfamilial hypogammaglobulinemia: Secondary | ICD-10-CM | POA: Diagnosis not present

## 2017-04-23 DIAGNOSIS — Z96659 Presence of unspecified artificial knee joint: Secondary | ICD-10-CM | POA: Diagnosis not present

## 2017-04-23 DIAGNOSIS — J849 Interstitial pulmonary disease, unspecified: Secondary | ICD-10-CM | POA: Diagnosis not present

## 2017-04-23 DIAGNOSIS — M00061 Staphylococcal arthritis, right knee: Secondary | ICD-10-CM | POA: Diagnosis present

## 2017-04-23 DIAGNOSIS — M25561 Pain in right knee: Secondary | ICD-10-CM | POA: Diagnosis not present

## 2017-04-23 DIAGNOSIS — T8453XA Infection and inflammatory reaction due to internal right knee prosthesis, initial encounter: Secondary | ICD-10-CM | POA: Diagnosis not present

## 2017-04-23 DIAGNOSIS — B957 Other staphylococcus as the cause of diseases classified elsewhere: Secondary | ICD-10-CM | POA: Diagnosis present

## 2017-04-23 DIAGNOSIS — Z89529 Acquired absence of unspecified knee: Secondary | ICD-10-CM | POA: Diagnosis not present

## 2017-04-23 DIAGNOSIS — Z96651 Presence of right artificial knee joint: Secondary | ICD-10-CM | POA: Diagnosis not present

## 2017-04-23 DIAGNOSIS — T8459XA Infection and inflammatory reaction due to other internal joint prosthesis, initial encounter: Secondary | ICD-10-CM | POA: Diagnosis not present

## 2017-04-23 DIAGNOSIS — G8918 Other acute postprocedural pain: Secondary | ICD-10-CM | POA: Diagnosis not present

## 2017-04-23 DIAGNOSIS — M069 Rheumatoid arthritis, unspecified: Secondary | ICD-10-CM | POA: Diagnosis not present

## 2017-04-23 DIAGNOSIS — K219 Gastro-esophageal reflux disease without esophagitis: Secondary | ICD-10-CM | POA: Diagnosis present

## 2017-04-23 DIAGNOSIS — G8929 Other chronic pain: Secondary | ICD-10-CM | POA: Diagnosis present

## 2017-04-23 DIAGNOSIS — N183 Chronic kidney disease, stage 3 (moderate): Secondary | ICD-10-CM | POA: Diagnosis not present

## 2017-04-23 DIAGNOSIS — E1122 Type 2 diabetes mellitus with diabetic chronic kidney disease: Secondary | ICD-10-CM | POA: Diagnosis present

## 2017-04-23 DIAGNOSIS — E119 Type 2 diabetes mellitus without complications: Secondary | ICD-10-CM | POA: Diagnosis not present

## 2017-04-30 DIAGNOSIS — M069 Rheumatoid arthritis, unspecified: Secondary | ICD-10-CM | POA: Diagnosis not present

## 2017-04-30 DIAGNOSIS — J439 Emphysema, unspecified: Secondary | ICD-10-CM | POA: Diagnosis not present

## 2017-04-30 DIAGNOSIS — M797 Fibromyalgia: Secondary | ICD-10-CM | POA: Diagnosis not present

## 2017-04-30 DIAGNOSIS — M00861 Arthritis due to other bacteria, right knee: Secondary | ICD-10-CM | POA: Diagnosis not present

## 2017-04-30 DIAGNOSIS — M71161 Other infective bursitis, right knee: Secondary | ICD-10-CM | POA: Diagnosis not present

## 2017-04-30 DIAGNOSIS — E119 Type 2 diabetes mellitus without complications: Secondary | ICD-10-CM | POA: Diagnosis not present

## 2017-05-01 DIAGNOSIS — M71161 Other infective bursitis, right knee: Secondary | ICD-10-CM | POA: Diagnosis not present

## 2017-05-01 DIAGNOSIS — J439 Emphysema, unspecified: Secondary | ICD-10-CM | POA: Diagnosis not present

## 2017-05-01 DIAGNOSIS — M00861 Arthritis due to other bacteria, right knee: Secondary | ICD-10-CM | POA: Diagnosis not present

## 2017-05-01 DIAGNOSIS — M069 Rheumatoid arthritis, unspecified: Secondary | ICD-10-CM | POA: Diagnosis not present

## 2017-05-01 DIAGNOSIS — M797 Fibromyalgia: Secondary | ICD-10-CM | POA: Diagnosis not present

## 2017-05-01 DIAGNOSIS — E119 Type 2 diabetes mellitus without complications: Secondary | ICD-10-CM | POA: Diagnosis not present

## 2017-05-02 DIAGNOSIS — M00861 Arthritis due to other bacteria, right knee: Secondary | ICD-10-CM | POA: Diagnosis not present

## 2017-05-02 DIAGNOSIS — M069 Rheumatoid arthritis, unspecified: Secondary | ICD-10-CM | POA: Diagnosis not present

## 2017-05-02 DIAGNOSIS — M797 Fibromyalgia: Secondary | ICD-10-CM | POA: Diagnosis not present

## 2017-05-02 DIAGNOSIS — M71161 Other infective bursitis, right knee: Secondary | ICD-10-CM | POA: Diagnosis not present

## 2017-05-02 DIAGNOSIS — E119 Type 2 diabetes mellitus without complications: Secondary | ICD-10-CM | POA: Diagnosis not present

## 2017-05-02 DIAGNOSIS — J439 Emphysema, unspecified: Secondary | ICD-10-CM | POA: Diagnosis not present

## 2017-05-05 DIAGNOSIS — G4730 Sleep apnea, unspecified: Secondary | ICD-10-CM | POA: Diagnosis not present

## 2017-05-05 DIAGNOSIS — M00861 Arthritis due to other bacteria, right knee: Secondary | ICD-10-CM | POA: Diagnosis not present

## 2017-05-05 DIAGNOSIS — J439 Emphysema, unspecified: Secondary | ICD-10-CM | POA: Diagnosis not present

## 2017-05-05 DIAGNOSIS — M71161 Other infective bursitis, right knee: Secondary | ICD-10-CM | POA: Diagnosis not present

## 2017-05-05 DIAGNOSIS — K210 Gastro-esophageal reflux disease with esophagitis: Secondary | ICD-10-CM | POA: Diagnosis not present

## 2017-05-05 DIAGNOSIS — M797 Fibromyalgia: Secondary | ICD-10-CM | POA: Diagnosis not present

## 2017-05-05 DIAGNOSIS — F321 Major depressive disorder, single episode, moderate: Secondary | ICD-10-CM | POA: Diagnosis not present

## 2017-05-05 DIAGNOSIS — E119 Type 2 diabetes mellitus without complications: Secondary | ICD-10-CM | POA: Diagnosis not present

## 2017-05-05 DIAGNOSIS — T8459XA Infection and inflammatory reaction due to other internal joint prosthesis, initial encounter: Secondary | ICD-10-CM | POA: Diagnosis not present

## 2017-05-05 DIAGNOSIS — E785 Hyperlipidemia, unspecified: Secondary | ICD-10-CM | POA: Diagnosis not present

## 2017-05-05 DIAGNOSIS — M069 Rheumatoid arthritis, unspecified: Secondary | ICD-10-CM | POA: Diagnosis not present

## 2017-05-05 DIAGNOSIS — M810 Age-related osteoporosis without current pathological fracture: Secondary | ICD-10-CM | POA: Diagnosis not present

## 2017-05-06 ENCOUNTER — Telehealth: Payer: Self-pay | Admitting: *Deleted

## 2017-05-06 DIAGNOSIS — S82031A Displaced transverse fracture of right patella, initial encounter for closed fracture: Secondary | ICD-10-CM | POA: Insufficient documentation

## 2017-05-06 NOTE — Telephone Encounter (Signed)
Linn from [facility] called to ask about orders for the patient PICC as he has them from Dr Baxter Flattery and Rob Hickman. Advised him per Baxter Flattery 04/21/17 the patient was to be D/C, picc pulled and switched to oral. He advised she is back on Vanc and wants to know if we should extend. Advised will need to call the patient to get clarification.   Called the patient and asked how she was still on Vanc. She advised her PICC was D/C and pulled on 04/21/17 and she had surgery 04/23/17 to remove hardware and the PICC was replaced by surgeons there at [facility]. Asked her where she is wanting to receive care and she advised with Dr Baxter Flattery. Advised her she will need to have [facility] send [place] her new information so that Dr Baxter Flattery could decied what she wanted to do as we had no idea she has surgery and was back on IV medication. She is concerned about missing a dose of medication while we get this taken care of. Advised her as soon as we get the information labs, cultures, scans, ect we can make a decision. But until Dr Baxter Flattery makes a decision we can not give a verba to extend the IV medication as technically she is a [facility] patient (since they replaced the PICC). She advised since she is non weight bearing and does not want to miss 1 dose and is probably going to miss the 05/14/17 appt with Dr Baxter Flattery she will stay with [facility] for her ID apt.   Reminded her to call them and reschedule her appt she had canceled it to stay with Dr Baxter Flattery and advised her to give [place] a call if there is anything we can do to help get this straightened out as there is a lot of miscommunication around her care and we need to make sure this is taken care of.  [facility] notified.

## 2017-05-06 NOTE — Telephone Encounter (Signed)
I think it makes sense for her to follow with [facility] since her latest surgery was there. Please let [facility] know we are no longer responsible for dosing/picc line/ please d/c follow up appt if any.

## 2017-05-07 DIAGNOSIS — M069 Rheumatoid arthritis, unspecified: Secondary | ICD-10-CM | POA: Diagnosis not present

## 2017-05-07 DIAGNOSIS — J439 Emphysema, unspecified: Secondary | ICD-10-CM | POA: Diagnosis not present

## 2017-05-07 DIAGNOSIS — M71161 Other infective bursitis, right knee: Secondary | ICD-10-CM | POA: Diagnosis not present

## 2017-05-07 DIAGNOSIS — M00861 Arthritis due to other bacteria, right knee: Secondary | ICD-10-CM | POA: Diagnosis not present

## 2017-05-07 DIAGNOSIS — E119 Type 2 diabetes mellitus without complications: Secondary | ICD-10-CM | POA: Diagnosis not present

## 2017-05-07 DIAGNOSIS — M797 Fibromyalgia: Secondary | ICD-10-CM | POA: Diagnosis not present

## 2017-05-07 NOTE — Telephone Encounter (Signed)
Patient and [facility] notified and appointment canceled.

## 2017-05-08 DIAGNOSIS — M069 Rheumatoid arthritis, unspecified: Secondary | ICD-10-CM | POA: Diagnosis not present

## 2017-05-08 DIAGNOSIS — E119 Type 2 diabetes mellitus without complications: Secondary | ICD-10-CM | POA: Diagnosis not present

## 2017-05-08 DIAGNOSIS — M797 Fibromyalgia: Secondary | ICD-10-CM | POA: Diagnosis not present

## 2017-05-08 DIAGNOSIS — M00861 Arthritis due to other bacteria, right knee: Secondary | ICD-10-CM | POA: Diagnosis not present

## 2017-05-08 DIAGNOSIS — J439 Emphysema, unspecified: Secondary | ICD-10-CM | POA: Diagnosis not present

## 2017-05-08 DIAGNOSIS — M71161 Other infective bursitis, right knee: Secondary | ICD-10-CM | POA: Diagnosis not present

## 2017-05-12 DIAGNOSIS — M71161 Other infective bursitis, right knee: Secondary | ICD-10-CM | POA: Diagnosis not present

## 2017-05-12 DIAGNOSIS — J439 Emphysema, unspecified: Secondary | ICD-10-CM | POA: Diagnosis not present

## 2017-05-12 DIAGNOSIS — E119 Type 2 diabetes mellitus without complications: Secondary | ICD-10-CM | POA: Diagnosis not present

## 2017-05-12 DIAGNOSIS — T8459XA Infection and inflammatory reaction due to other internal joint prosthesis, initial encounter: Secondary | ICD-10-CM | POA: Diagnosis not present

## 2017-05-12 DIAGNOSIS — M810 Age-related osteoporosis without current pathological fracture: Secondary | ICD-10-CM | POA: Diagnosis not present

## 2017-05-12 DIAGNOSIS — E785 Hyperlipidemia, unspecified: Secondary | ICD-10-CM | POA: Diagnosis not present

## 2017-05-12 DIAGNOSIS — M797 Fibromyalgia: Secondary | ICD-10-CM | POA: Diagnosis not present

## 2017-05-12 DIAGNOSIS — F321 Major depressive disorder, single episode, moderate: Secondary | ICD-10-CM | POA: Diagnosis not present

## 2017-05-12 DIAGNOSIS — M069 Rheumatoid arthritis, unspecified: Secondary | ICD-10-CM | POA: Diagnosis not present

## 2017-05-12 DIAGNOSIS — G4730 Sleep apnea, unspecified: Secondary | ICD-10-CM | POA: Diagnosis not present

## 2017-05-12 DIAGNOSIS — M00861 Arthritis due to other bacteria, right knee: Secondary | ICD-10-CM | POA: Diagnosis not present

## 2017-05-12 DIAGNOSIS — K210 Gastro-esophageal reflux disease with esophagitis: Secondary | ICD-10-CM | POA: Diagnosis not present

## 2017-05-14 ENCOUNTER — Ambulatory Visit: Payer: Medicare Other | Admitting: Internal Medicine

## 2017-05-19 DIAGNOSIS — T8459XA Infection and inflammatory reaction due to other internal joint prosthesis, initial encounter: Secondary | ICD-10-CM | POA: Diagnosis not present

## 2017-05-19 DIAGNOSIS — M797 Fibromyalgia: Secondary | ICD-10-CM | POA: Diagnosis not present

## 2017-05-19 DIAGNOSIS — M069 Rheumatoid arthritis, unspecified: Secondary | ICD-10-CM | POA: Diagnosis not present

## 2017-05-19 DIAGNOSIS — E119 Type 2 diabetes mellitus without complications: Secondary | ICD-10-CM | POA: Diagnosis not present

## 2017-05-19 DIAGNOSIS — J439 Emphysema, unspecified: Secondary | ICD-10-CM | POA: Diagnosis not present

## 2017-05-19 DIAGNOSIS — M00861 Arthritis due to other bacteria, right knee: Secondary | ICD-10-CM | POA: Diagnosis not present

## 2017-05-19 DIAGNOSIS — M71161 Other infective bursitis, right knee: Secondary | ICD-10-CM | POA: Diagnosis not present

## 2017-05-20 DIAGNOSIS — Z87891 Personal history of nicotine dependence: Secondary | ICD-10-CM | POA: Diagnosis not present

## 2017-05-20 DIAGNOSIS — Z96659 Presence of unspecified artificial knee joint: Secondary | ICD-10-CM | POA: Diagnosis not present

## 2017-05-20 DIAGNOSIS — Z95828 Presence of other vascular implants and grafts: Secondary | ICD-10-CM | POA: Diagnosis not present

## 2017-05-20 DIAGNOSIS — T8453XD Infection and inflammatory reaction due to internal right knee prosthesis, subsequent encounter: Secondary | ICD-10-CM | POA: Diagnosis not present

## 2017-05-20 DIAGNOSIS — Z452 Encounter for adjustment and management of vascular access device: Secondary | ICD-10-CM | POA: Diagnosis not present

## 2017-05-20 DIAGNOSIS — T8459XD Infection and inflammatory reaction due to other internal joint prosthesis, subsequent encounter: Secondary | ICD-10-CM | POA: Diagnosis not present

## 2017-05-20 DIAGNOSIS — Z792 Long term (current) use of antibiotics: Secondary | ICD-10-CM | POA: Diagnosis not present

## 2017-05-26 DIAGNOSIS — M00861 Arthritis due to other bacteria, right knee: Secondary | ICD-10-CM | POA: Diagnosis not present

## 2017-05-26 DIAGNOSIS — J439 Emphysema, unspecified: Secondary | ICD-10-CM | POA: Diagnosis not present

## 2017-05-26 DIAGNOSIS — Z89521 Acquired absence of right knee: Secondary | ICD-10-CM | POA: Diagnosis not present

## 2017-05-26 DIAGNOSIS — E119 Type 2 diabetes mellitus without complications: Secondary | ICD-10-CM | POA: Diagnosis not present

## 2017-05-26 DIAGNOSIS — M71161 Other infective bursitis, right knee: Secondary | ICD-10-CM | POA: Diagnosis not present

## 2017-05-26 DIAGNOSIS — M797 Fibromyalgia: Secondary | ICD-10-CM | POA: Diagnosis not present

## 2017-05-26 DIAGNOSIS — J45998 Other asthma: Secondary | ICD-10-CM | POA: Diagnosis not present

## 2017-05-26 DIAGNOSIS — T8459XA Infection and inflammatory reaction due to other internal joint prosthesis, initial encounter: Secondary | ICD-10-CM | POA: Diagnosis not present

## 2017-05-26 DIAGNOSIS — M810 Age-related osteoporosis without current pathological fracture: Secondary | ICD-10-CM | POA: Diagnosis not present

## 2017-05-26 DIAGNOSIS — M069 Rheumatoid arthritis, unspecified: Secondary | ICD-10-CM | POA: Diagnosis not present

## 2017-05-27 DIAGNOSIS — M810 Age-related osteoporosis without current pathological fracture: Secondary | ICD-10-CM | POA: Diagnosis not present

## 2017-05-27 DIAGNOSIS — E785 Hyperlipidemia, unspecified: Secondary | ICD-10-CM | POA: Diagnosis not present

## 2017-05-27 DIAGNOSIS — M069 Rheumatoid arthritis, unspecified: Secondary | ICD-10-CM | POA: Diagnosis not present

## 2017-05-27 DIAGNOSIS — M797 Fibromyalgia: Secondary | ICD-10-CM | POA: Diagnosis not present

## 2017-05-27 DIAGNOSIS — T8459XA Infection and inflammatory reaction due to other internal joint prosthesis, initial encounter: Secondary | ICD-10-CM | POA: Diagnosis not present

## 2017-05-27 DIAGNOSIS — G4730 Sleep apnea, unspecified: Secondary | ICD-10-CM | POA: Diagnosis not present

## 2017-05-27 DIAGNOSIS — Z87891 Personal history of nicotine dependence: Secondary | ICD-10-CM | POA: Diagnosis not present

## 2017-05-27 DIAGNOSIS — Z794 Long term (current) use of insulin: Secondary | ICD-10-CM | POA: Diagnosis not present

## 2017-05-27 DIAGNOSIS — Z7952 Long term (current) use of systemic steroids: Secondary | ICD-10-CM | POA: Diagnosis not present

## 2017-05-27 DIAGNOSIS — Z452 Encounter for adjustment and management of vascular access device: Secondary | ICD-10-CM | POA: Diagnosis not present

## 2017-05-27 DIAGNOSIS — J439 Emphysema, unspecified: Secondary | ICD-10-CM | POA: Diagnosis not present

## 2017-05-27 DIAGNOSIS — Z5181 Encounter for therapeutic drug level monitoring: Secondary | ICD-10-CM | POA: Diagnosis not present

## 2017-05-27 DIAGNOSIS — K210 Gastro-esophageal reflux disease with esophagitis: Secondary | ICD-10-CM | POA: Diagnosis not present

## 2017-05-27 DIAGNOSIS — Z7982 Long term (current) use of aspirin: Secondary | ICD-10-CM | POA: Diagnosis not present

## 2017-05-27 DIAGNOSIS — F321 Major depressive disorder, single episode, moderate: Secondary | ICD-10-CM | POA: Diagnosis not present

## 2017-06-02 DIAGNOSIS — F321 Major depressive disorder, single episode, moderate: Secondary | ICD-10-CM | POA: Diagnosis not present

## 2017-06-02 DIAGNOSIS — M069 Rheumatoid arthritis, unspecified: Secondary | ICD-10-CM | POA: Diagnosis not present

## 2017-06-02 DIAGNOSIS — J439 Emphysema, unspecified: Secondary | ICD-10-CM | POA: Diagnosis not present

## 2017-06-02 DIAGNOSIS — T8459XA Infection and inflammatory reaction due to other internal joint prosthesis, initial encounter: Secondary | ICD-10-CM | POA: Diagnosis not present

## 2017-06-02 DIAGNOSIS — M797 Fibromyalgia: Secondary | ICD-10-CM | POA: Diagnosis not present

## 2017-06-02 DIAGNOSIS — M810 Age-related osteoporosis without current pathological fracture: Secondary | ICD-10-CM | POA: Diagnosis not present

## 2017-06-05 DIAGNOSIS — Z89529 Acquired absence of unspecified knee: Secondary | ICD-10-CM | POA: Insufficient documentation

## 2017-06-05 DIAGNOSIS — G8929 Other chronic pain: Secondary | ICD-10-CM | POA: Diagnosis not present

## 2017-06-05 DIAGNOSIS — M797 Fibromyalgia: Secondary | ICD-10-CM | POA: Diagnosis not present

## 2017-06-05 DIAGNOSIS — M069 Rheumatoid arthritis, unspecified: Secondary | ICD-10-CM | POA: Diagnosis not present

## 2017-06-05 DIAGNOSIS — T8453XA Infection and inflammatory reaction due to internal right knee prosthesis, initial encounter: Secondary | ICD-10-CM | POA: Diagnosis not present

## 2017-06-05 DIAGNOSIS — M25561 Pain in right knee: Secondary | ICD-10-CM | POA: Diagnosis not present

## 2017-06-05 DIAGNOSIS — M810 Age-related osteoporosis without current pathological fracture: Secondary | ICD-10-CM | POA: Diagnosis not present

## 2017-06-05 DIAGNOSIS — F321 Major depressive disorder, single episode, moderate: Secondary | ICD-10-CM | POA: Diagnosis not present

## 2017-06-05 DIAGNOSIS — T8459XA Infection and inflammatory reaction due to other internal joint prosthesis, initial encounter: Secondary | ICD-10-CM | POA: Diagnosis not present

## 2017-06-05 DIAGNOSIS — J439 Emphysema, unspecified: Secondary | ICD-10-CM | POA: Diagnosis not present

## 2017-06-09 DIAGNOSIS — J439 Emphysema, unspecified: Secondary | ICD-10-CM | POA: Diagnosis not present

## 2017-06-09 DIAGNOSIS — F321 Major depressive disorder, single episode, moderate: Secondary | ICD-10-CM | POA: Diagnosis not present

## 2017-06-09 DIAGNOSIS — T8459XA Infection and inflammatory reaction due to other internal joint prosthesis, initial encounter: Secondary | ICD-10-CM | POA: Diagnosis not present

## 2017-06-09 DIAGNOSIS — M810 Age-related osteoporosis without current pathological fracture: Secondary | ICD-10-CM | POA: Diagnosis not present

## 2017-06-09 DIAGNOSIS — M069 Rheumatoid arthritis, unspecified: Secondary | ICD-10-CM | POA: Diagnosis not present

## 2017-06-09 DIAGNOSIS — M797 Fibromyalgia: Secondary | ICD-10-CM | POA: Diagnosis not present

## 2017-06-12 DIAGNOSIS — T8459XA Infection and inflammatory reaction due to other internal joint prosthesis, initial encounter: Secondary | ICD-10-CM | POA: Diagnosis not present

## 2017-06-12 DIAGNOSIS — M069 Rheumatoid arthritis, unspecified: Secondary | ICD-10-CM | POA: Diagnosis not present

## 2017-06-12 DIAGNOSIS — J439 Emphysema, unspecified: Secondary | ICD-10-CM | POA: Diagnosis not present

## 2017-06-12 DIAGNOSIS — M797 Fibromyalgia: Secondary | ICD-10-CM | POA: Diagnosis not present

## 2017-06-12 DIAGNOSIS — F321 Major depressive disorder, single episode, moderate: Secondary | ICD-10-CM | POA: Diagnosis not present

## 2017-06-12 DIAGNOSIS — M810 Age-related osteoporosis without current pathological fracture: Secondary | ICD-10-CM | POA: Diagnosis not present

## 2017-06-16 DIAGNOSIS — J439 Emphysema, unspecified: Secondary | ICD-10-CM | POA: Diagnosis not present

## 2017-06-16 DIAGNOSIS — F321 Major depressive disorder, single episode, moderate: Secondary | ICD-10-CM | POA: Diagnosis not present

## 2017-06-16 DIAGNOSIS — M069 Rheumatoid arthritis, unspecified: Secondary | ICD-10-CM | POA: Diagnosis not present

## 2017-06-16 DIAGNOSIS — T8459XA Infection and inflammatory reaction due to other internal joint prosthesis, initial encounter: Secondary | ICD-10-CM | POA: Diagnosis not present

## 2017-06-16 DIAGNOSIS — M797 Fibromyalgia: Secondary | ICD-10-CM | POA: Diagnosis not present

## 2017-06-16 DIAGNOSIS — M810 Age-related osteoporosis without current pathological fracture: Secondary | ICD-10-CM | POA: Diagnosis not present

## 2017-06-19 DIAGNOSIS — T8459XA Infection and inflammatory reaction due to other internal joint prosthesis, initial encounter: Secondary | ICD-10-CM | POA: Diagnosis not present

## 2017-06-19 DIAGNOSIS — J439 Emphysema, unspecified: Secondary | ICD-10-CM | POA: Diagnosis not present

## 2017-06-19 DIAGNOSIS — M797 Fibromyalgia: Secondary | ICD-10-CM | POA: Diagnosis not present

## 2017-06-19 DIAGNOSIS — M069 Rheumatoid arthritis, unspecified: Secondary | ICD-10-CM | POA: Diagnosis not present

## 2017-06-19 DIAGNOSIS — M810 Age-related osteoporosis without current pathological fracture: Secondary | ICD-10-CM | POA: Diagnosis not present

## 2017-06-19 DIAGNOSIS — F321 Major depressive disorder, single episode, moderate: Secondary | ICD-10-CM | POA: Diagnosis not present

## 2017-06-25 DIAGNOSIS — T8459XA Infection and inflammatory reaction due to other internal joint prosthesis, initial encounter: Secondary | ICD-10-CM | POA: Diagnosis not present

## 2017-06-25 DIAGNOSIS — M797 Fibromyalgia: Secondary | ICD-10-CM | POA: Diagnosis not present

## 2017-06-25 DIAGNOSIS — F321 Major depressive disorder, single episode, moderate: Secondary | ICD-10-CM | POA: Diagnosis not present

## 2017-06-25 DIAGNOSIS — J439 Emphysema, unspecified: Secondary | ICD-10-CM | POA: Diagnosis not present

## 2017-06-25 DIAGNOSIS — M810 Age-related osteoporosis without current pathological fracture: Secondary | ICD-10-CM | POA: Diagnosis not present

## 2017-06-25 DIAGNOSIS — M069 Rheumatoid arthritis, unspecified: Secondary | ICD-10-CM | POA: Diagnosis not present

## 2017-06-26 ENCOUNTER — Encounter: Payer: Self-pay | Admitting: Sports Medicine

## 2017-06-26 ENCOUNTER — Ambulatory Visit (INDEPENDENT_AMBULATORY_CARE_PROVIDER_SITE_OTHER): Payer: Medicare Other | Admitting: Sports Medicine

## 2017-06-26 DIAGNOSIS — Q828 Other specified congenital malformations of skin: Secondary | ICD-10-CM

## 2017-06-26 DIAGNOSIS — M069 Rheumatoid arthritis, unspecified: Secondary | ICD-10-CM

## 2017-06-26 DIAGNOSIS — B351 Tinea unguium: Secondary | ICD-10-CM | POA: Diagnosis not present

## 2017-06-26 DIAGNOSIS — E1142 Type 2 diabetes mellitus with diabetic polyneuropathy: Secondary | ICD-10-CM

## 2017-06-26 DIAGNOSIS — M79676 Pain in unspecified toe(s): Secondary | ICD-10-CM

## 2017-06-26 NOTE — Progress Notes (Signed)
Subjective: [name] is a 77 y.o. female patient with history of diabetes who returns to office today complaining of callus pain and long, painful nails; unable to trim. Patient states that the glucose was not recorded, DONT CHECK, last PCP visit was 1 month ago. Patient is in wheelchair had knee surgery and has been on IV Abx for knee sepsis. Patient denies any other issues at this time.   Patient Active Problem List   Diagnosis Date Noted  . 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 Septic prepatellar bursitis of right knee 10/26/2014  . Prepatellar bursitis of right knee 08/28/2014  . DYSPHAGIA ORAL PHASE 06/18/2007  . Asthma 05/14/2007  . SLEEP APNEA 05/14/2007  . GERD 05/13/2007  . Rheumatoid arthritis ([place]) 05/13/2007  . OSTEOPOROSIS 05/13/2007  . DIARRHEA, CHRONIC 05/13/2007   Current Outpatient Medications on File Prior to Visit  Medication Sig Dispense Refill  . Albuterol Sulfate (PROAIR RESPICLICK) 195 (90 Base) MCG/ACT AEPB Inhale 2 puffs into the lungs as needed (every four to six hours for cough or wheeze). 1 each 1  . aspirin EC 81 MG tablet Take 81 mg by mouth 2 (two) times a week.     Marland Kitchen atorvastatin (LIPITOR) 40 MG tablet Take 40 mg by mouth at bedtime.     . beclomethasone (QVAR) 80 MCG/ACT inhaler Inhale 2 puffs into the lungs 2 (two) times daily. 1 Inhaler 5  . cholecalciferol (VITAMIN D) 1000 UNITS tablet Take 1,000 Units by mouth at bedtime.     . Cyanocobalamin (B-12) 2500 MCG TABS Take 2,500 mcg by mouth at bedtime.     Marland Kitchen doxycycline (VIBRA-TABS) 100 MG tablet Take 100 mg by mouth 2 (two) times daily. 10 day course started 03/06/17 pm    . DULoxetine (CYMBALTA) 60 MG capsule Take 120 mg by mouth at bedtime.     Marland Kitchen ezetimibe (ZETIA) 10 MG tablet Take 10 mg by mouth at bedtime.     . fluticasone furoate-vilanterol (BREO ELLIPTA) 200-25 MCG/INH AEPB Inhale 1 puff into the lungs daily. 60 each 5  . gabapentin (NEURONTIN) 100 MG capsule Take 100 mg by mouth at  bedtime.    Marland Kitchen linagliptin (TRADJENTA) 5 MG TABS tablet Take 5 mg by mouth at bedtime.     Marland Kitchen loratadine (CLARITIN) 10 MG tablet Take 10 mg by mouth at bedtime.     . metFORMIN (GLUCOPHAGE) 500 MG tablet Take 1,000 mg by mouth at bedtime.     . montelukast (SINGULAIR) 10 MG tablet TAKE ONE TABLET BY MOUTH ONCE DAILY (Patient taking differently: TAKE ONE TABLET (10 MG) BY MOUTH ONCE DAILY AT BEDTIME) 30 tablet 0  . morphine (MSIR) 30 MG tablet Take 30 mg by mouth daily as needed for severe pain.    . Multiple Vitamin (MULTIVITAMIN WITH MINERALS) TABS tablet Take 1 tablet by mouth at bedtime.    . Omega-3 Fatty Acids (FISH OIL) 1200 MG CAPS Take 1,200 mg by mouth at bedtime.     . ondansetron (ZOFRAN) 4 MG tablet Take 1 tablet (4 mg total) by mouth every 6 (six) hours as needed for nausea. (Patient not taking: Reported on 03/14/2017) 20 tablet 0  . Oxycodone HCl 10 MG TABS Take 1 tablet (10 mg total) by mouth every 6 (six) hours as needed. (Patient not taking: Reported on 04/08/2017) 15 tablet 0  . pantoprazole (PROTONIX) 40 MG tablet Take one tablet once every morning (Patient taking differently: Take 40 mg by mouth at bedtime. ) 30  tablet 5  . predniSONE (DELTASONE) 5 MG tablet Take 5 mg by mouth at bedtime. For rheumatoid arthritis    . ranitidine (ZANTAC) 150 MG tablet TAKE TWO TABLETS EVERY EVENING (Patient taking differently: Take 300 mg by mouth at bedtime. ) 180 tablet 1   No current facility-administered medications on file prior to visit.    Allergies  Allergen Reactions  . Penicillins Hives    Has patient had a PCN reaction causing immediate rash, facial/tongue/throat swelling, SOB or lightheadedness with hypotension: Yes Has patient had a PCN reaction causing severe rash involving mucus membranes or skin necrosis: No Has patient had a PCN reaction that required hospitalization: No Has patient had a PCN reaction occurring within the last 10 years: No If all of the above answers are "NO",  then may proceed with Cephalosporin use.  . Sulfonamide Derivatives Nausea And Vomiting    REACTION: stomach problems    Recent Results (from the past 2160 hour(s))  Basic metabolic panel     Status: Abnormal   Collection Time: 04/08/17 10:25 AM  Result Value Ref Range   Glucose, Bld 125 (H) 65 - 99 mg/dL    Comment: .            Fasting reference interval . For someone without known diabetes, a glucose value between 100 and 125 mg/dL is consistent with prediabetes and should be confirmed with a follow-up test. .    BUN 25 7 - 25 mg/dL   Creat 1.16 (H) 0.60 - 0.93 mg/dL    Comment: For patients >57 years of age, the reference limit for Creatinine is approximately 13% higher for people identified as African-American. .    BUN/Creatinine Ratio 22 6 - 22 (calc)   Sodium 139 135 - 146 mmol/L   Potassium 4.2 3.5 - 5.3 mmol/L   Chloride 105 98 - 110 mmol/L   CO2 22 20 - 32 mmol/L   Calcium 9.0 8.6 - 10.4 mg/dL  CBC with Differential/Platelet     Status: Abnormal   Collection Time: 04/08/17 10:25 AM  Result Value Ref Range   WBC 13.7 (H) 3.8 - 10.8 Thousand/uL   RBC 4.12 3.80 - 5.10 Million/uL   Hemoglobin 10.8 (L) 11.7 - 15.5 g/dL   HCT 32.7 (L) 35.0 - 45.0 %   MCV 79.4 (L) 80.0 - 100.0 fL   MCH 26.2 (L) 27.0 - 33.0 pg   MCHC 33.0 32.0 - 36.0 g/dL   RDW 14.5 11.0 - 15.0 %   Platelets 435 (H) 140 - 400 Thousand/uL   MPV 10.9 7.5 - 12.5 fL   Neutro Abs 9,713 (H) 1,500 - 7,800 cells/uL   Lymphs Abs 2,069 850 - 3,900 cells/uL   WBC mixed population 1,302 (H) 200 - 950 cells/uL   Eosinophils Absolute 548 (H) 15 - 500 cells/uL   Basophils Absolute 69 0 - 200 cells/uL   Neutrophils Relative % 70.9 %   Total Lymphocyte 15.1 %   Monocytes Relative 9.5 %   Eosinophils Relative 4.0 %   Basophils Relative 0.5 %  C-reactive protein     Status: Abnormal   Collection Time: 04/08/17 10:25 AM  Result Value Ref Range   CRP 61.8 (H) <8.0 mg/L  Sedimentation rate     Status: Abnormal    Collection Time: 04/08/17 10:25 AM  Result Value Ref Range   Sed Rate 74 (H) 0 - 30 mm/h    Objective: General: Patient is awake, alert, and oriented x 3 and in  no acute distress in wheelchair with right leg brace on.  Integument: Skin is warm, dry and supple bilateral. Nails are tender, long, thickened and  dystrophic with subungual debris, consistent with onychomycosis, 1-5 bilateral. No signs of infection. Callus plantar medial arch on left foot and medial left third toe and right fourth toe distal tuft, Remaining integument unremarkable.  Vasculature:  Dorsalis Pedis pulse 1/4 bilateral. Posterior Tibial pulse  0/4 bilateral.  Capillary fill time <3 sec 1-5 bilateral. No hair growth to the level of the digits. Temperature gradient within normal limits. + varicosities present bilateral. Trace edema present bilateral.   Neurology: The patient has diminished sensation measured with a 5.07/10g Semmes Weinstein Monofilament at all pedal sites bilateral . Vibratory sensation diminished bilateral with tuning fork. No Babinski sign present bilateral.   Musculoskeletal: Severely dislocated lesser metatarsophalangeal joints and digital deformities secondary to rheumatoid arthritis, there is significant pes planus deformity with plantar bony prominences specifically at the navicular of the left foot. Muscular strength 4/5 in all lower extremity muscular groups bilateral without pain on range of motion . No tenderness with calf compression bilateral.  Assessment and Plan: Problem List Items Addressed This Visit      Musculoskeletal and Integument   Rheumatoid arthritis ([place])    Other Visit Diagnoses    Dermatophytosis of nail    -  Primary   Pain of toe, unspecified laterality       Porokeratosis       Diabetic polyneuropathy associated with type 2 diabetes mellitus ([place])         -Examined patient. -Discussed and educated patient on diabetic foot care, especially with  regards to the  vascular, neurological and musculoskeletal systems.  -Stressed the importance of good glycemic control and the detriment of not  controlling glucose levels in relation to the foot. -Mechanically Callus 2 using rotary bur and debrided all nails 1-5 bilateral using sterile nail nipper and filed with dremel without incident. -Answered all patient questions -Patient to return  in 2.5 months for at risk foot care -Patient advised to call the office if any problems or questions arise in the meantime.  Landis Martins, DPM

## 2017-07-14 DIAGNOSIS — D631 Anemia in chronic kidney disease: Secondary | ICD-10-CM | POA: Diagnosis not present

## 2017-07-14 DIAGNOSIS — S82031K Displaced transverse fracture of right patella, subsequent encounter for closed fracture with nonunion: Secondary | ICD-10-CM | POA: Diagnosis not present

## 2017-07-14 DIAGNOSIS — Z89529 Acquired absence of unspecified knee: Secondary | ICD-10-CM | POA: Diagnosis not present

## 2017-07-14 DIAGNOSIS — D801 Nonfamilial hypogammaglobulinemia: Secondary | ICD-10-CM | POA: Diagnosis not present

## 2017-07-14 DIAGNOSIS — J849 Interstitial pulmonary disease, unspecified: Secondary | ICD-10-CM | POA: Diagnosis not present

## 2017-07-14 DIAGNOSIS — E119 Type 2 diabetes mellitus without complications: Secondary | ICD-10-CM | POA: Diagnosis not present

## 2017-07-14 DIAGNOSIS — G4730 Sleep apnea, unspecified: Secondary | ICD-10-CM | POA: Diagnosis not present

## 2017-07-14 DIAGNOSIS — R768 Other specified abnormal immunological findings in serum: Secondary | ICD-10-CM | POA: Diagnosis not present

## 2017-07-14 DIAGNOSIS — Z87891 Personal history of nicotine dependence: Secondary | ICD-10-CM | POA: Diagnosis not present

## 2017-07-14 DIAGNOSIS — K219 Gastro-esophageal reflux disease without esophagitis: Secondary | ICD-10-CM | POA: Diagnosis not present

## 2017-07-14 DIAGNOSIS — M797 Fibromyalgia: Secondary | ICD-10-CM | POA: Diagnosis not present

## 2017-07-14 DIAGNOSIS — N183 Chronic kidney disease, stage 3 (moderate): Secondary | ICD-10-CM | POA: Diagnosis not present

## 2017-07-14 DIAGNOSIS — E7849 Other hyperlipidemia: Secondary | ICD-10-CM | POA: Diagnosis not present

## 2017-07-14 DIAGNOSIS — M06072 Rheumatoid arthritis without rheumatoid factor, left ankle and foot: Secondary | ICD-10-CM | POA: Diagnosis not present

## 2017-07-14 DIAGNOSIS — T8453XA Infection and inflammatory reaction due to internal right knee prosthesis, initial encounter: Secondary | ICD-10-CM | POA: Diagnosis not present

## 2017-07-14 DIAGNOSIS — J45909 Unspecified asthma, uncomplicated: Secondary | ICD-10-CM | POA: Diagnosis not present

## 2017-07-14 DIAGNOSIS — M06071 Rheumatoid arthritis without rheumatoid factor, right ankle and foot: Secondary | ICD-10-CM | POA: Diagnosis not present

## 2017-07-14 DIAGNOSIS — Z01818 Encounter for other preprocedural examination: Secondary | ICD-10-CM | POA: Diagnosis not present

## 2017-08-06 DIAGNOSIS — D638 Anemia in other chronic diseases classified elsewhere: Secondary | ICD-10-CM | POA: Diagnosis present

## 2017-08-06 DIAGNOSIS — Z471 Aftercare following joint replacement surgery: Secondary | ICD-10-CM | POA: Diagnosis not present

## 2017-08-06 DIAGNOSIS — T84032A Mechanical loosening of internal right knee prosthetic joint, initial encounter: Secondary | ICD-10-CM | POA: Diagnosis not present

## 2017-08-06 DIAGNOSIS — Z96651 Presence of right artificial knee joint: Secondary | ICD-10-CM | POA: Diagnosis not present

## 2017-08-06 DIAGNOSIS — Z4732 Aftercare following explantation of hip joint prosthesis: Secondary | ICD-10-CM | POA: Diagnosis not present

## 2017-08-06 DIAGNOSIS — E785 Hyperlipidemia, unspecified: Secondary | ICD-10-CM | POA: Diagnosis present

## 2017-08-06 DIAGNOSIS — Z993 Dependence on wheelchair: Secondary | ICD-10-CM | POA: Diagnosis not present

## 2017-08-06 DIAGNOSIS — J849 Interstitial pulmonary disease, unspecified: Secondary | ICD-10-CM | POA: Diagnosis not present

## 2017-08-06 DIAGNOSIS — M797 Fibromyalgia: Secondary | ICD-10-CM | POA: Diagnosis present

## 2017-08-06 DIAGNOSIS — D801 Nonfamilial hypogammaglobulinemia: Secondary | ICD-10-CM | POA: Diagnosis present

## 2017-08-06 DIAGNOSIS — N183 Chronic kidney disease, stage 3 (moderate): Secondary | ICD-10-CM | POA: Diagnosis not present

## 2017-08-06 DIAGNOSIS — J449 Chronic obstructive pulmonary disease, unspecified: Secondary | ICD-10-CM | POA: Diagnosis present

## 2017-08-06 DIAGNOSIS — M06072 Rheumatoid arthritis without rheumatoid factor, left ankle and foot: Secondary | ICD-10-CM | POA: Diagnosis not present

## 2017-08-06 DIAGNOSIS — R0902 Hypoxemia: Secondary | ICD-10-CM | POA: Diagnosis not present

## 2017-08-06 DIAGNOSIS — K219 Gastro-esophageal reflux disease without esophagitis: Secondary | ICD-10-CM | POA: Diagnosis present

## 2017-08-06 DIAGNOSIS — M069 Rheumatoid arthritis, unspecified: Secondary | ICD-10-CM | POA: Diagnosis present

## 2017-08-06 DIAGNOSIS — R768 Other specified abnormal immunological findings in serum: Secondary | ICD-10-CM | POA: Diagnosis present

## 2017-08-06 DIAGNOSIS — E7849 Other hyperlipidemia: Secondary | ICD-10-CM | POA: Diagnosis present

## 2017-08-06 DIAGNOSIS — G8918 Other acute postprocedural pain: Secondary | ICD-10-CM | POA: Diagnosis not present

## 2017-08-06 DIAGNOSIS — E1122 Type 2 diabetes mellitus with diabetic chronic kidney disease: Secondary | ICD-10-CM | POA: Diagnosis present

## 2017-08-06 DIAGNOSIS — Z89529 Acquired absence of unspecified knee: Secondary | ICD-10-CM | POA: Diagnosis not present

## 2017-08-06 DIAGNOSIS — G4730 Sleep apnea, unspecified: Secondary | ICD-10-CM | POA: Diagnosis present

## 2017-08-06 DIAGNOSIS — M25561 Pain in right knee: Secondary | ICD-10-CM | POA: Diagnosis not present

## 2017-08-06 DIAGNOSIS — Z87891 Personal history of nicotine dependence: Secondary | ICD-10-CM | POA: Diagnosis not present

## 2017-08-06 DIAGNOSIS — M06071 Rheumatoid arthritis without rheumatoid factor, right ankle and foot: Secondary | ICD-10-CM | POA: Diagnosis not present

## 2017-08-06 DIAGNOSIS — T8453XA Infection and inflammatory reaction due to internal right knee prosthesis, initial encounter: Secondary | ICD-10-CM | POA: Diagnosis not present

## 2017-08-11 DIAGNOSIS — E119 Type 2 diabetes mellitus without complications: Secondary | ICD-10-CM | POA: Diagnosis not present

## 2017-08-11 DIAGNOSIS — M06072 Rheumatoid arthritis without rheumatoid factor, left ankle and foot: Secondary | ICD-10-CM | POA: Diagnosis not present

## 2017-08-11 DIAGNOSIS — J849 Interstitial pulmonary disease, unspecified: Secondary | ICD-10-CM | POA: Diagnosis not present

## 2017-08-11 DIAGNOSIS — T8453XD Infection and inflammatory reaction due to internal right knee prosthesis, subsequent encounter: Secondary | ICD-10-CM | POA: Diagnosis not present

## 2017-08-11 DIAGNOSIS — Z8614 Personal history of Methicillin resistant Staphylococcus aureus infection: Secondary | ICD-10-CM | POA: Diagnosis not present

## 2017-08-11 DIAGNOSIS — M5031 Other cervical disc degeneration,  high cervical region: Secondary | ICD-10-CM | POA: Diagnosis not present

## 2017-08-11 DIAGNOSIS — M06071 Rheumatoid arthritis without rheumatoid factor, right ankle and foot: Secondary | ICD-10-CM | POA: Diagnosis not present

## 2017-08-11 DIAGNOSIS — Z7982 Long term (current) use of aspirin: Secondary | ICD-10-CM | POA: Diagnosis not present

## 2017-08-11 DIAGNOSIS — Z7952 Long term (current) use of systemic steroids: Secondary | ICD-10-CM | POA: Diagnosis not present

## 2017-08-11 DIAGNOSIS — M1991 Primary osteoarthritis, unspecified site: Secondary | ICD-10-CM | POA: Diagnosis not present

## 2017-08-11 DIAGNOSIS — M4302 Spondylolysis, cervical region: Secondary | ICD-10-CM | POA: Diagnosis not present

## 2017-08-11 DIAGNOSIS — J449 Chronic obstructive pulmonary disease, unspecified: Secondary | ICD-10-CM | POA: Diagnosis not present

## 2017-08-14 DIAGNOSIS — T8453XD Infection and inflammatory reaction due to internal right knee prosthesis, subsequent encounter: Secondary | ICD-10-CM | POA: Diagnosis not present

## 2017-08-14 DIAGNOSIS — J449 Chronic obstructive pulmonary disease, unspecified: Secondary | ICD-10-CM | POA: Diagnosis not present

## 2017-08-14 DIAGNOSIS — M06071 Rheumatoid arthritis without rheumatoid factor, right ankle and foot: Secondary | ICD-10-CM | POA: Diagnosis not present

## 2017-08-14 DIAGNOSIS — E119 Type 2 diabetes mellitus without complications: Secondary | ICD-10-CM | POA: Diagnosis not present

## 2017-08-14 DIAGNOSIS — M06072 Rheumatoid arthritis without rheumatoid factor, left ankle and foot: Secondary | ICD-10-CM | POA: Diagnosis not present

## 2017-08-14 DIAGNOSIS — J849 Interstitial pulmonary disease, unspecified: Secondary | ICD-10-CM | POA: Diagnosis not present

## 2017-08-18 DIAGNOSIS — J449 Chronic obstructive pulmonary disease, unspecified: Secondary | ICD-10-CM | POA: Diagnosis not present

## 2017-08-18 DIAGNOSIS — M06071 Rheumatoid arthritis without rheumatoid factor, right ankle and foot: Secondary | ICD-10-CM | POA: Diagnosis not present

## 2017-08-18 DIAGNOSIS — M06072 Rheumatoid arthritis without rheumatoid factor, left ankle and foot: Secondary | ICD-10-CM | POA: Diagnosis not present

## 2017-08-18 DIAGNOSIS — J849 Interstitial pulmonary disease, unspecified: Secondary | ICD-10-CM | POA: Diagnosis not present

## 2017-08-18 DIAGNOSIS — T8453XD Infection and inflammatory reaction due to internal right knee prosthesis, subsequent encounter: Secondary | ICD-10-CM | POA: Diagnosis not present

## 2017-08-18 DIAGNOSIS — E119 Type 2 diabetes mellitus without complications: Secondary | ICD-10-CM | POA: Diagnosis not present

## 2017-08-21 DIAGNOSIS — Z96651 Presence of right artificial knee joint: Secondary | ICD-10-CM | POA: Insufficient documentation

## 2017-08-21 DIAGNOSIS — Z471 Aftercare following joint replacement surgery: Secondary | ICD-10-CM | POA: Insufficient documentation

## 2017-09-17 ENCOUNTER — Ambulatory Visit: Payer: Medicare Other | Admitting: Sports Medicine

## 2017-09-19 DIAGNOSIS — T8453XD Infection and inflammatory reaction due to internal right knee prosthesis, subsequent encounter: Secondary | ICD-10-CM | POA: Diagnosis not present

## 2017-09-19 DIAGNOSIS — J849 Interstitial pulmonary disease, unspecified: Secondary | ICD-10-CM | POA: Diagnosis not present

## 2017-09-19 DIAGNOSIS — E119 Type 2 diabetes mellitus without complications: Secondary | ICD-10-CM | POA: Diagnosis not present

## 2017-09-19 DIAGNOSIS — M06072 Rheumatoid arthritis without rheumatoid factor, left ankle and foot: Secondary | ICD-10-CM | POA: Diagnosis not present

## 2017-09-19 DIAGNOSIS — M06071 Rheumatoid arthritis without rheumatoid factor, right ankle and foot: Secondary | ICD-10-CM | POA: Diagnosis not present

## 2017-09-19 DIAGNOSIS — J449 Chronic obstructive pulmonary disease, unspecified: Secondary | ICD-10-CM | POA: Diagnosis not present

## 2017-09-22 DIAGNOSIS — M06071 Rheumatoid arthritis without rheumatoid factor, right ankle and foot: Secondary | ICD-10-CM | POA: Diagnosis not present

## 2017-09-22 DIAGNOSIS — M06072 Rheumatoid arthritis without rheumatoid factor, left ankle and foot: Secondary | ICD-10-CM | POA: Diagnosis not present

## 2017-09-22 DIAGNOSIS — J849 Interstitial pulmonary disease, unspecified: Secondary | ICD-10-CM | POA: Diagnosis not present

## 2017-09-22 DIAGNOSIS — J449 Chronic obstructive pulmonary disease, unspecified: Secondary | ICD-10-CM | POA: Diagnosis not present

## 2017-09-22 DIAGNOSIS — E119 Type 2 diabetes mellitus without complications: Secondary | ICD-10-CM | POA: Diagnosis not present

## 2017-09-22 DIAGNOSIS — T8453XD Infection and inflammatory reaction due to internal right knee prosthesis, subsequent encounter: Secondary | ICD-10-CM | POA: Diagnosis not present

## 2017-09-25 DIAGNOSIS — M06072 Rheumatoid arthritis without rheumatoid factor, left ankle and foot: Secondary | ICD-10-CM | POA: Diagnosis not present

## 2017-09-25 DIAGNOSIS — E119 Type 2 diabetes mellitus without complications: Secondary | ICD-10-CM | POA: Diagnosis not present

## 2017-09-25 DIAGNOSIS — J449 Chronic obstructive pulmonary disease, unspecified: Secondary | ICD-10-CM | POA: Diagnosis not present

## 2017-09-25 DIAGNOSIS — T8453XD Infection and inflammatory reaction due to internal right knee prosthesis, subsequent encounter: Secondary | ICD-10-CM | POA: Diagnosis not present

## 2017-09-25 DIAGNOSIS — J849 Interstitial pulmonary disease, unspecified: Secondary | ICD-10-CM | POA: Diagnosis not present

## 2017-09-25 DIAGNOSIS — M06071 Rheumatoid arthritis without rheumatoid factor, right ankle and foot: Secondary | ICD-10-CM | POA: Diagnosis not present

## 2017-10-02 DIAGNOSIS — J849 Interstitial pulmonary disease, unspecified: Secondary | ICD-10-CM | POA: Diagnosis not present

## 2017-10-02 DIAGNOSIS — J449 Chronic obstructive pulmonary disease, unspecified: Secondary | ICD-10-CM | POA: Diagnosis not present

## 2017-10-02 DIAGNOSIS — T8453XD Infection and inflammatory reaction due to internal right knee prosthesis, subsequent encounter: Secondary | ICD-10-CM | POA: Diagnosis not present

## 2017-10-02 DIAGNOSIS — M06071 Rheumatoid arthritis without rheumatoid factor, right ankle and foot: Secondary | ICD-10-CM | POA: Diagnosis not present

## 2017-10-02 DIAGNOSIS — M06072 Rheumatoid arthritis without rheumatoid factor, left ankle and foot: Secondary | ICD-10-CM | POA: Diagnosis not present

## 2017-10-02 DIAGNOSIS — E119 Type 2 diabetes mellitus without complications: Secondary | ICD-10-CM | POA: Diagnosis not present

## 2017-10-08 ENCOUNTER — Ambulatory Visit (INDEPENDENT_AMBULATORY_CARE_PROVIDER_SITE_OTHER): Payer: Medicare Other | Admitting: Sports Medicine

## 2017-10-08 ENCOUNTER — Encounter: Payer: Self-pay | Admitting: Sports Medicine

## 2017-10-08 DIAGNOSIS — M21962 Unspecified acquired deformity of left lower leg: Secondary | ICD-10-CM

## 2017-10-08 DIAGNOSIS — M21961 Unspecified acquired deformity of right lower leg: Secondary | ICD-10-CM

## 2017-10-08 DIAGNOSIS — M069 Rheumatoid arthritis, unspecified: Secondary | ICD-10-CM

## 2017-10-08 DIAGNOSIS — M79676 Pain in unspecified toe(s): Secondary | ICD-10-CM

## 2017-10-08 DIAGNOSIS — B351 Tinea unguium: Secondary | ICD-10-CM

## 2017-10-08 DIAGNOSIS — Q828 Other specified congenital malformations of skin: Secondary | ICD-10-CM

## 2017-10-08 DIAGNOSIS — E1142 Type 2 diabetes mellitus with diabetic polyneuropathy: Secondary | ICD-10-CM

## 2017-10-08 NOTE — Progress Notes (Signed)
Subjective: [name] is a 77 y.o. female patient with history of diabetes who returns to office today complaining of callus pain and long, painful nails; unable to trim. Patient states that the glucose was not recorded, DONT CHECK, last PCP visit was 1-2 months ago. Patient is in wheelchair had knee surgery and reports that the area is slowly recovering currently getting home physical therapy.  Patient denies any other issues at this time.   Patient Active Problem List   Diagnosis Date Noted  . Arthritis, septic, knee ([address]) 03/09/2017  . Septic prepatellar bursitis of right knee 10/26/2014  . Prepatellar bursitis of right knee 08/28/2014  . DYSPHAGIA ORAL PHASE 06/18/2007  . Asthma 05/14/2007  . SLEEP APNEA 05/14/2007  . GERD 05/13/2007  . Rheumatoid arthritis ([place]) 05/13/2007  . OSTEOPOROSIS 05/13/2007  . DIARRHEA, CHRONIC 05/13/2007   Current Outpatient Medications on File Prior to Visit  Medication Sig Dispense Refill  . Albuterol Sulfate (PROAIR RESPICLICK) 371 (90 Base) MCG/ACT AEPB Inhale 2 puffs into the lungs as needed (every four to six hours for cough or wheeze). 1 each 1  . aspirin EC 81 MG tablet Take 81 mg by mouth 2 (two) times a week.     Marland Kitchen atorvastatin (LIPITOR) 40 MG tablet Take 40 mg by mouth at bedtime.     . beclomethasone (QVAR) 80 MCG/ACT inhaler Inhale 2 puffs into the lungs 2 (two) times daily. 1 Inhaler 5  . cholecalciferol (VITAMIN D) 1000 UNITS tablet Take 1,000 Units by mouth at bedtime.     . Cyanocobalamin (B-12) 2500 MCG TABS Take 2,500 mcg by mouth at bedtime.     Marland Kitchen doxycycline (VIBRA-TABS) 100 MG tablet Take 100 mg by mouth 2 (two) times daily. 10 day course started 03/06/17 pm    . DULoxetine (CYMBALTA) 60 MG capsule Take 120 mg by mouth at bedtime.     Marland Kitchen ezetimibe (ZETIA) 10 MG tablet Take 10 mg by mouth at bedtime.     . fluticasone furoate-vilanterol (BREO ELLIPTA) 200-25 MCG/INH AEPB Inhale 1 puff into the lungs daily. 60 each 5  . gabapentin  (NEURONTIN) 100 MG capsule Take 100 mg by mouth at bedtime.    Marland Kitchen linagliptin (TRADJENTA) 5 MG TABS tablet Take 5 mg by mouth at bedtime.     Marland Kitchen loratadine (CLARITIN) 10 MG tablet Take 10 mg by mouth at bedtime.     . metFORMIN (GLUCOPHAGE) 500 MG tablet Take 1,000 mg by mouth at bedtime.     . montelukast (SINGULAIR) 10 MG tablet TAKE ONE TABLET BY MOUTH ONCE DAILY (Patient taking differently: TAKE ONE TABLET (10 MG) BY MOUTH ONCE DAILY AT BEDTIME) 30 tablet 0  . morphine (MSIR) 30 MG tablet Take 30 mg by mouth daily as needed for severe pain.    . Multiple Vitamin (MULTIVITAMIN WITH MINERALS) TABS tablet Take 1 tablet by mouth at bedtime.    . Omega-3 Fatty Acids (FISH OIL) 1200 MG CAPS Take 1,200 mg by mouth at bedtime.     . ondansetron (ZOFRAN) 4 MG tablet Take 1 tablet (4 mg total) by mouth every 6 (six) hours as needed for nausea. 20 tablet 0  . Oxycodone HCl 10 MG TABS Take 1 tablet (10 mg total) by mouth every 6 (six) hours as needed. 15 tablet 0  . pantoprazole (PROTONIX) 40 MG tablet Take one tablet once every morning (Patient taking differently: Take 40 mg by mouth at bedtime. ) 30 tablet 5  . predniSONE (DELTASONE) 5  MG tablet Take 5 mg by mouth at bedtime. For rheumatoid arthritis    . ranitidine (ZANTAC) 150 MG tablet TAKE TWO TABLETS EVERY EVENING (Patient taking differently: Take 300 mg by mouth at bedtime. ) 180 tablet 1   No current facility-administered medications on file prior to visit.    Allergies  Allergen Reactions  . Penicillins Hives    Has patient had a PCN reaction causing immediate rash, facial/tongue/throat swelling, SOB or lightheadedness with hypotension: Yes Has patient had a PCN reaction causing severe rash involving mucus membranes or skin necrosis: No Has patient had a PCN reaction that required hospitalization: No Has patient had a PCN reaction occurring within the last 10 years: No If all of the above answers are "NO", then may proceed with Cephalosporin  use.  . Sulfonamide Derivatives Nausea And Vomiting    REACTION: stomach problems    No results found for this or any previous visit (from the past 2160 hour(s)).  Objective: General: Patient is awake, alert, and oriented x 3 and in no acute distress in wheelchair   Integument: Skin is warm, dry and supple bilateral. Nails are tender, long, thickened and  dystrophic with subungual debris, consistent with onychomycosis, 1-5 bilateral. No signs of infection.  Minimal callus bilateral, Remaining integument unremarkable.  Vasculature:  Dorsalis Pedis pulse 1/4 bilateral. Posterior Tibial pulse  0/4 bilateral.  Capillary fill time <3 sec 1-5 bilateral. No hair growth to the level of the digits. Temperature gradient within normal limits. + varicosities present bilateral. Trace edema present bilateral.   Neurology: The patient has diminished sensation measured with a 5.07/10g Semmes Weinstein Monofilament at all pedal sites bilateral . Vibratory sensation diminished bilateral with tuning fork. No Babinski sign present bilateral.   Musculoskeletal: Severely dislocated lesser metatarsophalangeal joints and digital deformities secondary to rheumatoid arthritis, there is significant pes planus deformity with plantar bony prominences specifically at the navicular of the left foot. Muscular strength 4/5 in all lower extremity muscular groups bilateral without pain on range of motion . No tenderness with calf compression bilateral.  Assessment and Plan: Problem List Items Addressed This Visit      Musculoskeletal and Integument   Rheumatoid arthritis ([place])    Other Visit Diagnoses    Dermatophytosis of nail    -  Primary   Pain of toe, unspecified laterality       Porokeratosis       Diabetic polyneuropathy associated with type 2 diabetes mellitus ([facility])       Foot deformity, bilateral         -Examined patient. -Discussed and educated patient on diabetic foot care, especially with  regards to  the vascular, neurological and musculoskeletal systems.  -Stressed the importance of good glycemic control and the detriment of not  controlling glucose levels in relation to the foot. -Mechanically debrided all nails 1-5 bilateral using sterile nail nipper and filed with dremel without incident. -Answered all patient questions -Patient to return  in 2.5 months for at risk foot care -Patient advised to call the office if any problems or questions arise in the meantime.  Landis Martins, DPM

## 2017-10-09 DIAGNOSIS — J449 Chronic obstructive pulmonary disease, unspecified: Secondary | ICD-10-CM | POA: Diagnosis not present

## 2017-10-09 DIAGNOSIS — M06071 Rheumatoid arthritis without rheumatoid factor, right ankle and foot: Secondary | ICD-10-CM | POA: Diagnosis not present

## 2017-10-09 DIAGNOSIS — M06072 Rheumatoid arthritis without rheumatoid factor, left ankle and foot: Secondary | ICD-10-CM | POA: Diagnosis not present

## 2017-10-09 DIAGNOSIS — E119 Type 2 diabetes mellitus without complications: Secondary | ICD-10-CM | POA: Diagnosis not present

## 2017-10-09 DIAGNOSIS — T8453XD Infection and inflammatory reaction due to internal right knee prosthesis, subsequent encounter: Secondary | ICD-10-CM | POA: Diagnosis not present

## 2017-10-09 DIAGNOSIS — J849 Interstitial pulmonary disease, unspecified: Secondary | ICD-10-CM | POA: Diagnosis not present

## 2017-10-10 DIAGNOSIS — T8453XD Infection and inflammatory reaction due to internal right knee prosthesis, subsequent encounter: Secondary | ICD-10-CM | POA: Diagnosis not present

## 2017-10-10 DIAGNOSIS — Z8614 Personal history of Methicillin resistant Staphylococcus aureus infection: Secondary | ICD-10-CM | POA: Diagnosis not present

## 2017-10-10 DIAGNOSIS — M4302 Spondylolysis, cervical region: Secondary | ICD-10-CM | POA: Diagnosis not present

## 2017-10-10 DIAGNOSIS — Z7952 Long term (current) use of systemic steroids: Secondary | ICD-10-CM | POA: Diagnosis not present

## 2017-10-10 DIAGNOSIS — M06071 Rheumatoid arthritis without rheumatoid factor, right ankle and foot: Secondary | ICD-10-CM | POA: Diagnosis not present

## 2017-10-10 DIAGNOSIS — M1991 Primary osteoarthritis, unspecified site: Secondary | ICD-10-CM | POA: Diagnosis not present

## 2017-10-10 DIAGNOSIS — J849 Interstitial pulmonary disease, unspecified: Secondary | ICD-10-CM | POA: Diagnosis not present

## 2017-10-10 DIAGNOSIS — J449 Chronic obstructive pulmonary disease, unspecified: Secondary | ICD-10-CM | POA: Diagnosis not present

## 2017-10-10 DIAGNOSIS — M5031 Other cervical disc degeneration,  high cervical region: Secondary | ICD-10-CM | POA: Diagnosis not present

## 2017-10-10 DIAGNOSIS — M06072 Rheumatoid arthritis without rheumatoid factor, left ankle and foot: Secondary | ICD-10-CM | POA: Diagnosis not present

## 2017-10-10 DIAGNOSIS — Z7982 Long term (current) use of aspirin: Secondary | ICD-10-CM | POA: Diagnosis not present

## 2017-10-10 DIAGNOSIS — E119 Type 2 diabetes mellitus without complications: Secondary | ICD-10-CM | POA: Diagnosis not present

## 2017-10-14 DIAGNOSIS — J849 Interstitial pulmonary disease, unspecified: Secondary | ICD-10-CM | POA: Diagnosis not present

## 2017-10-14 DIAGNOSIS — M06071 Rheumatoid arthritis without rheumatoid factor, right ankle and foot: Secondary | ICD-10-CM | POA: Diagnosis not present

## 2017-10-14 DIAGNOSIS — E119 Type 2 diabetes mellitus without complications: Secondary | ICD-10-CM | POA: Diagnosis not present

## 2017-10-14 DIAGNOSIS — J449 Chronic obstructive pulmonary disease, unspecified: Secondary | ICD-10-CM | POA: Diagnosis not present

## 2017-10-14 DIAGNOSIS — T8453XD Infection and inflammatory reaction due to internal right knee prosthesis, subsequent encounter: Secondary | ICD-10-CM | POA: Diagnosis not present

## 2017-10-14 DIAGNOSIS — M06072 Rheumatoid arthritis without rheumatoid factor, left ankle and foot: Secondary | ICD-10-CM | POA: Diagnosis not present

## 2017-10-16 DIAGNOSIS — M06071 Rheumatoid arthritis without rheumatoid factor, right ankle and foot: Secondary | ICD-10-CM | POA: Diagnosis not present

## 2017-10-16 DIAGNOSIS — J849 Interstitial pulmonary disease, unspecified: Secondary | ICD-10-CM | POA: Diagnosis not present

## 2017-10-16 DIAGNOSIS — M06072 Rheumatoid arthritis without rheumatoid factor, left ankle and foot: Secondary | ICD-10-CM | POA: Diagnosis not present

## 2017-10-16 DIAGNOSIS — E119 Type 2 diabetes mellitus without complications: Secondary | ICD-10-CM | POA: Diagnosis not present

## 2017-10-16 DIAGNOSIS — T8453XD Infection and inflammatory reaction due to internal right knee prosthesis, subsequent encounter: Secondary | ICD-10-CM | POA: Diagnosis not present

## 2017-10-16 DIAGNOSIS — J449 Chronic obstructive pulmonary disease, unspecified: Secondary | ICD-10-CM | POA: Diagnosis not present

## 2017-10-20 DIAGNOSIS — T8453XD Infection and inflammatory reaction due to internal right knee prosthesis, subsequent encounter: Secondary | ICD-10-CM | POA: Diagnosis not present

## 2017-10-20 DIAGNOSIS — M06072 Rheumatoid arthritis without rheumatoid factor, left ankle and foot: Secondary | ICD-10-CM | POA: Diagnosis not present

## 2017-10-20 DIAGNOSIS — E119 Type 2 diabetes mellitus without complications: Secondary | ICD-10-CM | POA: Diagnosis not present

## 2017-10-20 DIAGNOSIS — M06071 Rheumatoid arthritis without rheumatoid factor, right ankle and foot: Secondary | ICD-10-CM | POA: Diagnosis not present

## 2017-10-20 DIAGNOSIS — J849 Interstitial pulmonary disease, unspecified: Secondary | ICD-10-CM | POA: Diagnosis not present

## 2017-10-20 DIAGNOSIS — J449 Chronic obstructive pulmonary disease, unspecified: Secondary | ICD-10-CM | POA: Diagnosis not present

## 2017-10-23 DIAGNOSIS — M06072 Rheumatoid arthritis without rheumatoid factor, left ankle and foot: Secondary | ICD-10-CM | POA: Diagnosis not present

## 2017-10-23 DIAGNOSIS — J449 Chronic obstructive pulmonary disease, unspecified: Secondary | ICD-10-CM | POA: Diagnosis not present

## 2017-10-23 DIAGNOSIS — J849 Interstitial pulmonary disease, unspecified: Secondary | ICD-10-CM | POA: Diagnosis not present

## 2017-10-23 DIAGNOSIS — T8453XD Infection and inflammatory reaction due to internal right knee prosthesis, subsequent encounter: Secondary | ICD-10-CM | POA: Diagnosis not present

## 2017-10-23 DIAGNOSIS — M06071 Rheumatoid arthritis without rheumatoid factor, right ankle and foot: Secondary | ICD-10-CM | POA: Diagnosis not present

## 2017-10-23 DIAGNOSIS — E119 Type 2 diabetes mellitus without complications: Secondary | ICD-10-CM | POA: Diagnosis not present

## 2017-10-27 DIAGNOSIS — E119 Type 2 diabetes mellitus without complications: Secondary | ICD-10-CM | POA: Diagnosis not present

## 2017-10-27 DIAGNOSIS — M06071 Rheumatoid arthritis without rheumatoid factor, right ankle and foot: Secondary | ICD-10-CM | POA: Diagnosis not present

## 2017-10-27 DIAGNOSIS — J449 Chronic obstructive pulmonary disease, unspecified: Secondary | ICD-10-CM | POA: Diagnosis not present

## 2017-10-27 DIAGNOSIS — T8453XD Infection and inflammatory reaction due to internal right knee prosthesis, subsequent encounter: Secondary | ICD-10-CM | POA: Diagnosis not present

## 2017-10-27 DIAGNOSIS — M06072 Rheumatoid arthritis without rheumatoid factor, left ankle and foot: Secondary | ICD-10-CM | POA: Diagnosis not present

## 2017-10-27 DIAGNOSIS — J849 Interstitial pulmonary disease, unspecified: Secondary | ICD-10-CM | POA: Diagnosis not present

## 2017-10-30 DIAGNOSIS — Z471 Aftercare following joint replacement surgery: Secondary | ICD-10-CM | POA: Diagnosis not present

## 2017-10-30 DIAGNOSIS — Z96651 Presence of right artificial knee joint: Secondary | ICD-10-CM | POA: Diagnosis not present

## 2017-10-30 DIAGNOSIS — S82031D Displaced transverse fracture of right patella, subsequent encounter for closed fracture with routine healing: Secondary | ICD-10-CM | POA: Diagnosis not present

## 2017-11-17 DIAGNOSIS — T8453XD Infection and inflammatory reaction due to internal right knee prosthesis, subsequent encounter: Secondary | ICD-10-CM | POA: Diagnosis not present

## 2017-11-17 DIAGNOSIS — M06071 Rheumatoid arthritis without rheumatoid factor, right ankle and foot: Secondary | ICD-10-CM | POA: Diagnosis not present

## 2017-11-17 DIAGNOSIS — M06072 Rheumatoid arthritis without rheumatoid factor, left ankle and foot: Secondary | ICD-10-CM | POA: Diagnosis not present

## 2017-11-17 DIAGNOSIS — E119 Type 2 diabetes mellitus without complications: Secondary | ICD-10-CM | POA: Diagnosis not present

## 2017-11-17 DIAGNOSIS — J849 Interstitial pulmonary disease, unspecified: Secondary | ICD-10-CM | POA: Diagnosis not present

## 2017-11-17 DIAGNOSIS — J449 Chronic obstructive pulmonary disease, unspecified: Secondary | ICD-10-CM | POA: Diagnosis not present

## 2017-11-20 DIAGNOSIS — J449 Chronic obstructive pulmonary disease, unspecified: Secondary | ICD-10-CM | POA: Diagnosis not present

## 2017-11-20 DIAGNOSIS — E119 Type 2 diabetes mellitus without complications: Secondary | ICD-10-CM | POA: Diagnosis not present

## 2017-11-20 DIAGNOSIS — J849 Interstitial pulmonary disease, unspecified: Secondary | ICD-10-CM | POA: Diagnosis not present

## 2017-11-20 DIAGNOSIS — M06072 Rheumatoid arthritis without rheumatoid factor, left ankle and foot: Secondary | ICD-10-CM | POA: Diagnosis not present

## 2017-11-20 DIAGNOSIS — M06071 Rheumatoid arthritis without rheumatoid factor, right ankle and foot: Secondary | ICD-10-CM | POA: Diagnosis not present

## 2017-11-20 DIAGNOSIS — T8453XD Infection and inflammatory reaction due to internal right knee prosthesis, subsequent encounter: Secondary | ICD-10-CM | POA: Diagnosis not present

## 2017-11-27 DIAGNOSIS — T8453XD Infection and inflammatory reaction due to internal right knee prosthesis, subsequent encounter: Secondary | ICD-10-CM | POA: Diagnosis not present

## 2017-11-27 DIAGNOSIS — M06072 Rheumatoid arthritis without rheumatoid factor, left ankle and foot: Secondary | ICD-10-CM | POA: Diagnosis not present

## 2017-11-27 DIAGNOSIS — J449 Chronic obstructive pulmonary disease, unspecified: Secondary | ICD-10-CM | POA: Diagnosis not present

## 2017-11-27 DIAGNOSIS — J849 Interstitial pulmonary disease, unspecified: Secondary | ICD-10-CM | POA: Diagnosis not present

## 2017-11-27 DIAGNOSIS — M06071 Rheumatoid arthritis without rheumatoid factor, right ankle and foot: Secondary | ICD-10-CM | POA: Diagnosis not present

## 2017-11-27 DIAGNOSIS — E119 Type 2 diabetes mellitus without complications: Secondary | ICD-10-CM | POA: Diagnosis not present

## 2017-12-01 DIAGNOSIS — J849 Interstitial pulmonary disease, unspecified: Secondary | ICD-10-CM | POA: Diagnosis not present

## 2017-12-01 DIAGNOSIS — M06072 Rheumatoid arthritis without rheumatoid factor, left ankle and foot: Secondary | ICD-10-CM | POA: Diagnosis not present

## 2017-12-01 DIAGNOSIS — M06071 Rheumatoid arthritis without rheumatoid factor, right ankle and foot: Secondary | ICD-10-CM | POA: Diagnosis not present

## 2017-12-01 DIAGNOSIS — J449 Chronic obstructive pulmonary disease, unspecified: Secondary | ICD-10-CM | POA: Diagnosis not present

## 2017-12-01 DIAGNOSIS — T8453XD Infection and inflammatory reaction due to internal right knee prosthesis, subsequent encounter: Secondary | ICD-10-CM | POA: Diagnosis not present

## 2017-12-01 DIAGNOSIS — E119 Type 2 diabetes mellitus without complications: Secondary | ICD-10-CM | POA: Diagnosis not present

## 2017-12-04 DIAGNOSIS — T8453XD Infection and inflammatory reaction due to internal right knee prosthesis, subsequent encounter: Secondary | ICD-10-CM | POA: Diagnosis not present

## 2017-12-04 DIAGNOSIS — E119 Type 2 diabetes mellitus without complications: Secondary | ICD-10-CM | POA: Diagnosis not present

## 2017-12-04 DIAGNOSIS — M06071 Rheumatoid arthritis without rheumatoid factor, right ankle and foot: Secondary | ICD-10-CM | POA: Diagnosis not present

## 2017-12-04 DIAGNOSIS — J449 Chronic obstructive pulmonary disease, unspecified: Secondary | ICD-10-CM | POA: Diagnosis not present

## 2017-12-04 DIAGNOSIS — J849 Interstitial pulmonary disease, unspecified: Secondary | ICD-10-CM | POA: Diagnosis not present

## 2017-12-04 DIAGNOSIS — M06072 Rheumatoid arthritis without rheumatoid factor, left ankle and foot: Secondary | ICD-10-CM | POA: Diagnosis not present

## 2017-12-08 DIAGNOSIS — Z79899 Other long term (current) drug therapy: Secondary | ICD-10-CM | POA: Diagnosis not present

## 2017-12-08 DIAGNOSIS — N183 Chronic kidney disease, stage 3 (moderate): Secondary | ICD-10-CM | POA: Diagnosis not present

## 2017-12-08 DIAGNOSIS — E119 Type 2 diabetes mellitus without complications: Secondary | ICD-10-CM | POA: Diagnosis not present

## 2017-12-08 DIAGNOSIS — E785 Hyperlipidemia, unspecified: Secondary | ICD-10-CM | POA: Diagnosis not present

## 2017-12-08 DIAGNOSIS — R300 Dysuria: Secondary | ICD-10-CM | POA: Diagnosis not present

## 2017-12-08 DIAGNOSIS — Z130 Encounter for screening for diseases of the blood and blood-forming organs and certain disorders involving the immune mechanism: Secondary | ICD-10-CM | POA: Diagnosis not present

## 2017-12-09 DIAGNOSIS — M1991 Primary osteoarthritis, unspecified site: Secondary | ICD-10-CM | POA: Diagnosis not present

## 2017-12-09 DIAGNOSIS — T8453XD Infection and inflammatory reaction due to internal right knee prosthesis, subsequent encounter: Secondary | ICD-10-CM | POA: Diagnosis not present

## 2017-12-09 DIAGNOSIS — J449 Chronic obstructive pulmonary disease, unspecified: Secondary | ICD-10-CM | POA: Diagnosis not present

## 2017-12-09 DIAGNOSIS — J849 Interstitial pulmonary disease, unspecified: Secondary | ICD-10-CM | POA: Diagnosis not present

## 2017-12-09 DIAGNOSIS — Z7982 Long term (current) use of aspirin: Secondary | ICD-10-CM | POA: Diagnosis not present

## 2017-12-09 DIAGNOSIS — Z8614 Personal history of Methicillin resistant Staphylococcus aureus infection: Secondary | ICD-10-CM | POA: Diagnosis not present

## 2017-12-09 DIAGNOSIS — M06072 Rheumatoid arthritis without rheumatoid factor, left ankle and foot: Secondary | ICD-10-CM | POA: Diagnosis not present

## 2017-12-09 DIAGNOSIS — M06071 Rheumatoid arthritis without rheumatoid factor, right ankle and foot: Secondary | ICD-10-CM | POA: Diagnosis not present

## 2017-12-09 DIAGNOSIS — Z7952 Long term (current) use of systemic steroids: Secondary | ICD-10-CM | POA: Diagnosis not present

## 2017-12-09 DIAGNOSIS — M4302 Spondylolysis, cervical region: Secondary | ICD-10-CM | POA: Diagnosis not present

## 2017-12-09 DIAGNOSIS — M5031 Other cervical disc degeneration,  high cervical region: Secondary | ICD-10-CM | POA: Diagnosis not present

## 2017-12-09 DIAGNOSIS — E119 Type 2 diabetes mellitus without complications: Secondary | ICD-10-CM | POA: Diagnosis not present

## 2017-12-11 DIAGNOSIS — J449 Chronic obstructive pulmonary disease, unspecified: Secondary | ICD-10-CM | POA: Diagnosis not present

## 2017-12-11 DIAGNOSIS — E119 Type 2 diabetes mellitus without complications: Secondary | ICD-10-CM | POA: Diagnosis not present

## 2017-12-11 DIAGNOSIS — M06072 Rheumatoid arthritis without rheumatoid factor, left ankle and foot: Secondary | ICD-10-CM | POA: Diagnosis not present

## 2017-12-11 DIAGNOSIS — T8453XD Infection and inflammatory reaction due to internal right knee prosthesis, subsequent encounter: Secondary | ICD-10-CM | POA: Diagnosis not present

## 2017-12-11 DIAGNOSIS — J849 Interstitial pulmonary disease, unspecified: Secondary | ICD-10-CM | POA: Diagnosis not present

## 2017-12-11 DIAGNOSIS — M06071 Rheumatoid arthritis without rheumatoid factor, right ankle and foot: Secondary | ICD-10-CM | POA: Diagnosis not present

## 2017-12-15 DIAGNOSIS — N281 Cyst of kidney, acquired: Secondary | ICD-10-CM | POA: Diagnosis not present

## 2017-12-15 DIAGNOSIS — I700 Atherosclerosis of aorta: Secondary | ICD-10-CM | POA: Diagnosis not present

## 2017-12-15 DIAGNOSIS — Z1389 Encounter for screening for other disorder: Secondary | ICD-10-CM | POA: Diagnosis not present

## 2017-12-15 DIAGNOSIS — Z0000 Encounter for general adult medical examination without abnormal findings: Secondary | ICD-10-CM | POA: Diagnosis not present

## 2017-12-15 DIAGNOSIS — Z6827 Body mass index (BMI) 27.0-27.9, adult: Secondary | ICD-10-CM | POA: Diagnosis not present

## 2017-12-15 DIAGNOSIS — Z79899 Other long term (current) drug therapy: Secondary | ICD-10-CM | POA: Diagnosis not present

## 2017-12-15 DIAGNOSIS — Z23 Encounter for immunization: Secondary | ICD-10-CM | POA: Diagnosis not present

## 2017-12-15 DIAGNOSIS — J849 Interstitial pulmonary disease, unspecified: Secondary | ICD-10-CM | POA: Diagnosis not present

## 2017-12-15 DIAGNOSIS — K862 Cyst of pancreas: Secondary | ICD-10-CM | POA: Diagnosis not present

## 2017-12-15 DIAGNOSIS — R0609 Other forms of dyspnea: Secondary | ICD-10-CM | POA: Diagnosis not present

## 2017-12-15 DIAGNOSIS — Z89521 Acquired absence of right knee: Secondary | ICD-10-CM | POA: Diagnosis not present

## 2017-12-15 DIAGNOSIS — N183 Chronic kidney disease, stage 3 (moderate): Secondary | ICD-10-CM | POA: Diagnosis not present

## 2017-12-15 DIAGNOSIS — M5136 Other intervertebral disc degeneration, lumbar region: Secondary | ICD-10-CM | POA: Diagnosis not present

## 2017-12-18 DIAGNOSIS — J849 Interstitial pulmonary disease, unspecified: Secondary | ICD-10-CM | POA: Diagnosis not present

## 2017-12-18 DIAGNOSIS — M06072 Rheumatoid arthritis without rheumatoid factor, left ankle and foot: Secondary | ICD-10-CM | POA: Diagnosis not present

## 2017-12-18 DIAGNOSIS — J449 Chronic obstructive pulmonary disease, unspecified: Secondary | ICD-10-CM | POA: Diagnosis not present

## 2017-12-18 DIAGNOSIS — T8453XD Infection and inflammatory reaction due to internal right knee prosthesis, subsequent encounter: Secondary | ICD-10-CM | POA: Diagnosis not present

## 2017-12-18 DIAGNOSIS — E119 Type 2 diabetes mellitus without complications: Secondary | ICD-10-CM | POA: Diagnosis not present

## 2017-12-18 DIAGNOSIS — M06071 Rheumatoid arthritis without rheumatoid factor, right ankle and foot: Secondary | ICD-10-CM | POA: Diagnosis not present

## 2017-12-25 DIAGNOSIS — R269 Unspecified abnormalities of gait and mobility: Secondary | ICD-10-CM | POA: Diagnosis not present

## 2017-12-25 DIAGNOSIS — M810 Age-related osteoporosis without current pathological fracture: Secondary | ICD-10-CM | POA: Diagnosis not present

## 2017-12-25 DIAGNOSIS — M797 Fibromyalgia: Secondary | ICD-10-CM | POA: Diagnosis not present

## 2017-12-25 DIAGNOSIS — Z89521 Acquired absence of right knee: Secondary | ICD-10-CM | POA: Diagnosis not present

## 2017-12-25 DIAGNOSIS — M069 Rheumatoid arthritis, unspecified: Secondary | ICD-10-CM | POA: Diagnosis not present

## 2017-12-25 DIAGNOSIS — E1151 Type 2 diabetes mellitus with diabetic peripheral angiopathy without gangrene: Secondary | ICD-10-CM | POA: Diagnosis not present

## 2017-12-25 DIAGNOSIS — J449 Chronic obstructive pulmonary disease, unspecified: Secondary | ICD-10-CM | POA: Diagnosis not present

## 2017-12-25 DIAGNOSIS — G4733 Obstructive sleep apnea (adult) (pediatric): Secondary | ICD-10-CM | POA: Diagnosis not present

## 2017-12-25 DIAGNOSIS — M5380 Other specified dorsopathies, site unspecified: Secondary | ICD-10-CM | POA: Diagnosis not present

## 2017-12-25 DIAGNOSIS — R0609 Other forms of dyspnea: Secondary | ICD-10-CM | POA: Diagnosis not present

## 2018-01-17 ENCOUNTER — Encounter (HOSPITAL_COMMUNITY): Payer: Self-pay | Admitting: *Deleted

## 2018-01-17 ENCOUNTER — Emergency Department (HOSPITAL_COMMUNITY): Payer: Medicare Other

## 2018-01-17 ENCOUNTER — Inpatient Hospital Stay (HOSPITAL_COMMUNITY)
Admission: EM | Admit: 2018-01-17 | Discharge: 2018-01-19 | DRG: 309 | Disposition: A | Discharge status: Home Health | Payer: Medicare Other | Attending: Internal Medicine | Admitting: Internal Medicine

## 2018-01-17 ENCOUNTER — Other Ambulatory Visit: Payer: Self-pay

## 2018-01-17 DIAGNOSIS — Z833 Family history of diabetes mellitus: Secondary | ICD-10-CM

## 2018-01-17 DIAGNOSIS — Z96653 Presence of artificial knee joint, bilateral: Secondary | ICD-10-CM | POA: Diagnosis present

## 2018-01-17 DIAGNOSIS — J439 Emphysema, unspecified: Secondary | ICD-10-CM | POA: Diagnosis present

## 2018-01-17 DIAGNOSIS — E119 Type 2 diabetes mellitus without complications: Secondary | ICD-10-CM | POA: Diagnosis not present

## 2018-01-17 DIAGNOSIS — M810 Age-related osteoporosis without current pathological fracture: Secondary | ICD-10-CM | POA: Diagnosis present

## 2018-01-17 DIAGNOSIS — E860 Dehydration: Secondary | ICD-10-CM | POA: Diagnosis present

## 2018-01-17 DIAGNOSIS — K921 Melena: Secondary | ICD-10-CM | POA: Diagnosis present

## 2018-01-17 DIAGNOSIS — R002 Palpitations: Secondary | ICD-10-CM | POA: Diagnosis not present

## 2018-01-17 DIAGNOSIS — R000 Tachycardia, unspecified: Principal | ICD-10-CM | POA: Diagnosis present

## 2018-01-17 DIAGNOSIS — G4489 Other headache syndrome: Secondary | ICD-10-CM | POA: Diagnosis not present

## 2018-01-17 DIAGNOSIS — T380X6A Underdosing of glucocorticoids and synthetic analogues, initial encounter: Secondary | ICD-10-CM | POA: Diagnosis present

## 2018-01-17 DIAGNOSIS — Z87891 Personal history of nicotine dependence: Secondary | ICD-10-CM

## 2018-01-17 DIAGNOSIS — N390 Urinary tract infection, site not specified: Secondary | ICD-10-CM | POA: Diagnosis not present

## 2018-01-17 DIAGNOSIS — I959 Hypotension, unspecified: Secondary | ICD-10-CM | POA: Diagnosis present

## 2018-01-17 DIAGNOSIS — E088 Diabetes mellitus due to underlying condition with unspecified complications: Secondary | ICD-10-CM

## 2018-01-17 DIAGNOSIS — Z882 Allergy status to sulfonamides status: Secondary | ICD-10-CM

## 2018-01-17 DIAGNOSIS — Z803 Family history of malignant neoplasm of breast: Secondary | ICD-10-CM

## 2018-01-17 DIAGNOSIS — R0902 Hypoxemia: Secondary | ICD-10-CM | POA: Diagnosis not present

## 2018-01-17 DIAGNOSIS — G4733 Obstructive sleep apnea (adult) (pediatric): Secondary | ICD-10-CM | POA: Diagnosis present

## 2018-01-17 DIAGNOSIS — M797 Fibromyalgia: Secondary | ICD-10-CM | POA: Diagnosis present

## 2018-01-17 DIAGNOSIS — R197 Diarrhea, unspecified: Secondary | ICD-10-CM | POA: Diagnosis not present

## 2018-01-17 DIAGNOSIS — Z7984 Long term (current) use of oral hypoglycemic drugs: Secondary | ICD-10-CM

## 2018-01-17 DIAGNOSIS — I9589 Other hypotension: Secondary | ICD-10-CM | POA: Diagnosis present

## 2018-01-17 DIAGNOSIS — Z7982 Long term (current) use of aspirin: Secondary | ICD-10-CM

## 2018-01-17 DIAGNOSIS — I10 Essential (primary) hypertension: Secondary | ICD-10-CM | POA: Diagnosis present

## 2018-01-17 DIAGNOSIS — M069 Rheumatoid arthritis, unspecified: Secondary | ICD-10-CM | POA: Diagnosis present

## 2018-01-17 DIAGNOSIS — Z880 Allergy status to penicillin: Secondary | ICD-10-CM

## 2018-01-17 LAB — COMPREHENSIVE METABOLIC PANEL
ALT: 13 U/L (ref 0–44)
AST: 17 U/L (ref 15–41)
Albumin: 2.7 g/dL — ABNORMAL LOW (ref 3.5–5.0)
Alkaline Phosphatase: 76 U/L (ref 38–126)
Anion gap: 10 (ref 5–15)
BUN: 14 mg/dL (ref 8–23)
CO2: 20 mmol/L — ABNORMAL LOW (ref 22–32)
Calcium: 8.8 mg/dL — ABNORMAL LOW (ref 8.9–10.3)
Chloride: 103 mmol/L (ref 98–111)
Creatinine, Ser: 1.05 mg/dL — ABNORMAL HIGH (ref 0.44–1.00)
GFR calc Af Amer: 58 mL/min — ABNORMAL LOW (ref >60)
GFR calc non Af Amer: 50 mL/min — ABNORMAL LOW (ref >60)
Glucose, Bld: 116 mg/dL — ABNORMAL HIGH (ref 70–99)
Potassium: 4.5 mmol/L (ref 3.5–5.1)
Sodium: 133 mmol/L — ABNORMAL LOW (ref 135–145)
Total Bilirubin: 1.1 mg/dL (ref 0.3–1.2)
Total Protein: 5.7 g/dL — ABNORMAL LOW (ref 6.5–8.1)

## 2018-01-17 LAB — URINALYSIS, ROUTINE W REFLEX MICROSCOPIC
Bilirubin Urine: NEGATIVE
Glucose, UA: NEGATIVE mg/dL
Hgb urine dipstick: NEGATIVE
Ketones, ur: NEGATIVE mg/dL
Nitrite: NEGATIVE
Protein, ur: NEGATIVE mg/dL
Specific Gravity, Urine: 1.012 (ref 1.005–1.030)
pH: 5 (ref 5.0–8.0)

## 2018-01-17 LAB — CBC WITH DIFFERENTIAL/PLATELET
Abs Immature Granulocytes: 0.11 10*3/uL — ABNORMAL HIGH (ref 0.00–0.07)
Basophils Absolute: 0.1 10*3/uL (ref 0.0–0.1)
Basophils Relative: 1 %
Eosinophils Absolute: 0.3 10*3/uL (ref 0.0–0.5)
Eosinophils Relative: 3 %
HCT: 35.4 % — ABNORMAL LOW (ref 36.0–46.0)
Hemoglobin: 10.3 g/dL — ABNORMAL LOW (ref 12.0–15.0)
Immature Granulocytes: 1 %
Lymphocytes Relative: 17 %
Lymphs Abs: 1.5 10*3/uL (ref 0.7–4.0)
MCH: 23.4 pg — ABNORMAL LOW (ref 26.0–34.0)
MCHC: 29.1 g/dL — ABNORMAL LOW (ref 30.0–36.0)
MCV: 80.5 fL (ref 80.0–100.0)
Monocytes Absolute: 0.8 10*3/uL (ref 0.1–1.0)
Monocytes Relative: 9 %
Neutro Abs: 6.1 10*3/uL (ref 1.7–7.7)
Neutrophils Relative %: 69 %
Platelets: 334 10*3/uL (ref 150–400)
RBC: 4.4 MIL/uL (ref 3.87–5.11)
RDW: 16.9 % — ABNORMAL HIGH (ref 11.5–15.5)
WBC: 8.8 10*3/uL (ref 4.0–10.5)
nRBC: 0 % (ref 0.0–0.2)

## 2018-01-17 LAB — I-STAT TROPONIN, ED: Troponin i, poc: 0 ng/mL (ref 0.00–0.08)

## 2018-01-17 LAB — I-STAT CG4 LACTIC ACID, ED: Lactic Acid, Venous: 1.3 mmol/L (ref 0.5–1.9)

## 2018-01-17 LAB — D-DIMER, QUANTITATIVE: D-Dimer, Quant: 2.67 ug/mL-FEU — ABNORMAL HIGH (ref 0.00–0.50)

## 2018-01-17 LAB — TROPONIN I: Troponin I: <0.03 ng/mL (ref <0.03)

## 2018-01-17 MED ORDER — HYDROCORTISONE NA SUCCINATE PF 100 MG IJ SOLR
100.0000 mg | Freq: Three times a day (TID) | INTRAMUSCULAR | Status: DC
  Administered 2018-01-17: 100 mg via INTRAVENOUS
  Filled 2018-01-17: qty 2

## 2018-01-17 MED ORDER — METRONIDAZOLE IN NACL 5-0.79 MG/ML-% IV SOLN
500.0000 mg | Freq: Three times a day (TID) | INTRAVENOUS | Status: DC
  Administered 2018-01-18: 500 mg via INTRAVENOUS
  Filled 2018-01-17: qty 100

## 2018-01-17 MED ORDER — SODIUM CHLORIDE 0.9 % IV BOLUS (SEPSIS)
500.0000 mL | Freq: Once | INTRAVENOUS | Status: AC
  Administered 2018-01-17: 500 mL via INTRAVENOUS

## 2018-01-17 MED ORDER — SODIUM CHLORIDE 0.9 % IV BOLUS (SEPSIS)
1000.0000 mL | Freq: Once | INTRAVENOUS | Status: AC
  Administered 2018-01-17: 1000 mL via INTRAVENOUS

## 2018-01-17 MED ORDER — SODIUM CHLORIDE 0.9 % IV SOLN
1.0000 g | Freq: Once | INTRAVENOUS | Status: AC
  Administered 2018-01-17: 1 g via INTRAVENOUS
  Filled 2018-01-17: qty 10

## 2018-01-17 MED ORDER — SODIUM CHLORIDE 0.9 % IV BOLUS
500.0000 mL | Freq: Once | INTRAVENOUS | Status: AC
  Administered 2018-01-17: 500 mL via INTRAVENOUS

## 2018-01-17 MED ORDER — ACETAMINOPHEN 325 MG PO TABS
650.0000 mg | ORAL_TABLET | Freq: Once | ORAL | Status: AC
  Administered 2018-01-17: 650 mg via ORAL
  Filled 2018-01-17: qty 2

## 2018-01-17 NOTE — ED Provider Notes (Signed)
[facility] EMERGENCY DEPARTMENT Provider Note   CSN: [id number] Arrival date & time: 01/17/18  1533     History   Chief Complaint Chief Complaint  Patient presents with  . Palpitations    HPI [name] is a 77 y.o. female with history of diabetes, rheumatoid arthritis, fibromyalgia, emphysema who presents with a 5-day history of intermittent palpitations.  She denies any other symptoms.  She reports since her palpitations started she has not taken any of her medications at home.  She denies any cough, chest pain, shortness of breath, abdominal pain, nausea, vomiting, urinary symptoms.  Patient has had 2 surgeries to her right knee in past year, ambulates with walker and denies any worsening pain in her knee.  She denies any fever at home.  HPI  Past Medical History:  Diagnosis Date  . Asthma   . Complication of anesthesia    Anesthesia told her years ago she got too cold during surgery  . Diabetes mellitus without complication ([place])   . Emphysema   . Fibromyalgia   . Osteoporosis   . Rheumatoid arthritis(714.0)   . Sleep apnea    no cpap    Patient Active Problem List   Diagnosis Date Noted  . 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 Septic prepatellar bursitis of right knee 10/26/2014  . Prepatellar bursitis of right knee 08/28/2014  . DYSPHAGIA ORAL PHASE 06/18/2007  . Asthma 05/14/2007  . SLEEP APNEA 05/14/2007  . GERD 05/13/2007  . Rheumatoid arthritis ([place]) 05/13/2007  . OSTEOPOROSIS 05/13/2007  . DIARRHEA, CHRONIC 05/13/2007    Past Surgical History:  Procedure Laterality Date  . ABDOMINAL HYSTERECTOMY    . ANKLE FRACTURE SURGERY  2007  . BREAST SURGERY     mammoplasty and then removed  . FOOT SURGERY     numerous  . I&D EXTREMITY Right 10/26/2014   Procedure: IRRIGATION AND DEBRIDEMENT RIGHT KNEE ;  Surgeon: Gaynelle Arabian, MD;  Location: [facility];  Service: Orthopedics;  Laterality: Right;  . IRRIGATION AND DEBRIDEMENT KNEE Right  03/09/2017   Procedure: IRRIGATION AND DEBRIDEMENT RIGHT KNEE PRE-PATELLAR BURSA;  Surgeon: Susa Day, MD;  Location: [facility];  Service: Orthopedics;  Laterality: Right;  . KNEE BURSECTOMY Right 09/16/2014   Procedure: EXCISON OF PRE PATELLA BURSA RIGHT KNEE ;  Surgeon: Gaynelle Arabian, MD;  Location: [facility];  Service: Orthopedics;  Laterality: Right;  . PARTIAL HYSTERECTOMY  1970  . TOTAL KNEE ARTHROPLASTY  2005 / 2006   x2     OB History   None      Home Medications    Prior to Admission medications   Medication Sig Start Date End Date Taking? Authorizing Provider  Albuterol Sulfate (PROAIR RESPICLICK) 409 (90 Base) MCG/ACT AEPB Inhale 2 puffs into the lungs as needed (every four to six hours for cough or wheeze). 05/13/16  Yes Kozlow, Donnamarie Poag, MD  aspirin EC 81 MG tablet Take 81 mg by mouth 2 (two) times a week.    Yes [provider]  cholecalciferol (VITAMIN D) 1000 UNITS tablet Take 1,000 Units by mouth at bedtime.    Yes [provider]  Cyanocobalamin (B-12) 2500 MCG TABS Take 2,500 mcg by mouth at bedtime.    Yes [provider]  DULoxetine (CYMBALTA) 60 MG capsule Take 60 mg by mouth at bedtime.    Yes [provider]  ezetimibe (ZETIA) 10 MG tablet Take 10 mg by mouth at bedtime.  11/04/16  Yes [provider]  gabapentin (NEURONTIN) 100 MG capsule Take 100 mg by mouth at bedtime.   Yes [provider]  loratadine (CLARITIN) 10 MG tablet Take 10 mg by mouth at bedtime.    Yes [provider]  metFORMIN (GLUCOPHAGE) 500 MG tablet Take 1,000 mg by mouth at bedtime.  01/04/16  Yes [provider]  morphine (MSIR) 30 MG tablet Take 30 mg by mouth daily as needed for severe pain.   Yes [provider]  Multiple Vitamin (MULTIVITAMIN WITH MINERALS) TABS tablet Take 1 tablet by mouth at bedtime.   Yes [provider]  Omega-3 Fatty Acids (FISH OIL) 1200 MG CAPS Take 1,200 mg by mouth at bedtime.     Yes [provider]  ondansetron (ZOFRAN) 4 MG tablet Take 1 tablet (4 mg total) by mouth every 6 (six) hours as needed for nausea. 03/13/17  Yes Emokpae, Courage, MD  pantoprazole (PROTONIX) 40 MG tablet Take one tablet once every morning Patient taking differently: Take 40 mg by mouth at bedtime.  10/18/15  Yes Kozlow, Donnamarie Poag, MD  predniSONE (DELTASONE) 5 MG tablet Take 5 mg by mouth at bedtime. For rheumatoid arthritis 01/02/17  Yes [provider]  ranitidine (ZANTAC) 150 MG tablet TAKE TWO TABLETS EVERY EVENING Patient taking differently: Take 300 mg by mouth at bedtime.  01/23/16  Yes Kozlow, Donnamarie Poag, MD  beclomethasone (QVAR) 80 MCG/ACT inhaler Inhale 2 puffs into the lungs 2 (two) times daily. Patient not taking: Reported on 01/17/2018 01/18/16   Jiles Prows, MD  fluticasone furoate-vilanterol (BREO ELLIPTA) 200-25 MCG/INH AEPB Inhale 1 puff into the lungs daily. Patient not taking: Reported on 01/17/2018 08/30/15   Jiles Prows, MD  montelukast (SINGULAIR) 10 MG tablet TAKE ONE TABLET BY MOUTH ONCE DAILY Patient not taking: Reported on 01/17/2018 02/03/17   Jiles Prows, MD  Oxycodone HCl 10 MG TABS Take 1 tablet (10 mg total) by mouth every 6 (six) hours as needed. Patient not taking: Reported on 01/17/2018 03/14/17   Blanchie Dessert, MD    Family History Family History  Problem Relation Age of Onset  . Hypertension Unknown   . Diabetes Unknown   . Stroke Unknown   . Diabetes Mother   . Diabetes Father   . Diabetes Sister   . Diabetes Maternal Aunt   . Cancer Paternal Uncle   . Cancer Paternal Grandmother   . Breast cancer Paternal Grandmother     Social History Social History   Tobacco Use  . Smoking status: Former Smoker    Last attempt to quit: 03/11/1994    Years since quitting: 23.8  . Smokeless tobacco: Never Used  Substance Use Topics  . Alcohol use: No  . Drug use: No     Allergies   Penicillins; Sulfamethoxazole-trimethoprim; and  Sulfonamide derivatives   Review of Systems Review of Systems  Constitutional: Negative for chills and fever.  HENT: Negative for facial swelling and sore throat.   Respiratory: Negative for shortness of breath.   Cardiovascular: Positive for palpitations. Negative for chest pain.  Gastrointestinal: Negative for abdominal pain, nausea and vomiting.  Genitourinary: Negative for dysuria.  Musculoskeletal: Positive for arthralgias (chronic R knee pain). Negative for back pain.  Skin: Negative for rash and wound.  Neurological: Negative for headaches.  Psychiatric/Behavioral: The patient is not nervous/anxious.      Physical Exam Updated Vital Signs BP 119/83   Pulse 93   Temp 100.3 F (37.9 C) (Rectal)  Resp (!) 27   Ht 5\' 5"  (1.651 m)   Wt 77.1 kg   SpO2 97%   BMI 28.29 kg/m   Physical Exam  Constitutional: She appears well-developed and well-nourished. No distress.  HENT:  Head: Normocephalic and atraumatic.  Mouth/Throat: Oropharynx is clear and moist. No oropharyngeal exudate.  Eyes: Pupils are equal, round, and reactive to light. Conjunctivae are normal. Right eye exhibits no discharge. Left eye exhibits no discharge. No scleral icterus.  Neck: Normal range of motion. Neck supple. No thyromegaly present.  Cardiovascular: Normal rate, regular rhythm, normal heart sounds and intact distal pulses. Exam reveals no gallop and no friction rub.  No murmur heard. Pulmonary/Chest: Effort normal and breath sounds normal. No stridor. No respiratory distress. She has no wheezes. She has no rales.  Abdominal: Soft. Bowel sounds are normal. She exhibits no distension. There is no tenderness. There is no rebound and no guarding.  Musculoskeletal: She exhibits no edema.  Scar noted to right knee with erythema or tenderness; no pain with passive range of motion of the knee  Lymphadenopathy:    She has no cervical adenopathy.  Neurological: She is alert. Coordination normal.  Skin:  Skin is warm and dry. No rash noted. She is not diaphoretic. No pallor.  Rashes or signs of infection on inspection of skin  Psychiatric: She has a normal mood and affect.  Nursing note and vitals reviewed.    ED Treatments / Results  Labs (all labs ordered are listed, but only abnormal results are displayed) Labs Reviewed  COMPREHENSIVE METABOLIC PANEL - Abnormal; Notable for the following components:      Result Value   Sodium 133 (*)    CO2 20 (*)    Glucose, Bld 116 (*)    Creatinine, Ser 1.05 (*)    Calcium 8.8 (*)    Total Protein 5.7 (*)    Albumin 2.7 (*)    GFR calc non Af Amer 50 (*)    GFR calc Af Amer 58 (*)    All other components within normal limits  CBC WITH DIFFERENTIAL/PLATELET - Abnormal; Notable for the following components:   Hemoglobin 10.3 (*)    HCT 35.4 (*)    MCH 23.4 (*)    MCHC 29.1 (*)    RDW 16.9 (*)    Abs Immature Granulocytes 0.11 (*)    All other components within normal limits  URINALYSIS, ROUTINE W REFLEX MICROSCOPIC - Abnormal; Notable for the following components:   APPearance HAZY (*)    Leukocytes, UA LARGE (*)    Bacteria, UA RARE (*)    All other components within normal limits  CULTURE, BLOOD (ROUTINE X 2)  CULTURE, BLOOD (ROUTINE X 2)  URINE CULTURE  I-STAT CG4 LACTIC ACID, ED  I-STAT TROPONIN, ED    EKG EKG Interpretation  Date/Time:  Saturday January 17 2018 15:43:39 EST Ventricular Rate:  98 PR Interval:    QRS Duration: 96 QT Interval:  357 QTC Calculation: 456 R Axis:   -62 Text Interpretation:  Sinus tachycardia Atrial premature complexes in couplets Left anterior fascicular block Abnormal R-wave progression, late transition No significant change since last tracing 09 March 2017 Confirmed by Pattricia Boss [phone]) on 01/17/2018 3:56:26 PM Also confirmed by Pattricia Boss [phone]), editor Philomena Doheny [phone])  on 01/17/2018 4:42:14 PM   Radiology Dg Chest 2 View  Result Date: 01/17/2018 CLINICAL DATA:  Fever  and heart palpitations. EXAM: CHEST - 2 VIEW COMPARISON:  03/09/2017 FINDINGS: Mild cardiomegaly. No  focal consolidation or edema. No pleural effusion or pneumothorax. IMPRESSION: No active cardiopulmonary disease. Electronically Signed   By: Ulyses Jarred M.D.   On: 01/17/2018 17:30    Procedures Procedures (including critical care time)  Medications Ordered in ED Medications  cefTRIAXone (ROCEPHIN) 1 g in sodium chloride 0.9 % 100 mL IVPB (1 g Intravenous New Bag/Given 01/17/18 2042)  sodium chloride 0.9 % bolus 1,000 mL (0 mLs Intravenous Stopped 01/17/18 1902)    And  sodium chloride 0.9 % bolus 1,000 mL (0 mLs Intravenous Stopped 01/17/18 1902)    And  sodium chloride 0.9 % bolus 500 mL (0 mLs Intravenous Stopped 01/17/18 1952)  acetaminophen (TYLENOL) tablet 650 mg (650 mg Oral Given 01/17/18 1715)     Initial Impression / Assessment and Plan / ED Course  I have reviewed the triage vital signs and the nursing notes.  Pertinent labs & imaging results that were available during my care of the patient were reviewed by me and considered in my medical decision making (see chart for details).     Patient presenting with palpitations and fever.  Patient found to have UTI in the ED.  Patient mildly hypotensive, which responded well to fluids.  Rocephin initiated in the ED.  Blood cultures and urine culture sent.  Chest x-ray is clear.  EKG shows sinus tachycardia without any significant change since last tracing.  Considering patient is febrile, lives alone, and has been intermittently hypotensive, I feel patient would benefit from admission.  Patient does not meet sepsis criteria at this time.  I discussed patient case with Dr. Roel Cluck with [facility] who accepts patient for further management.  Patient also evaluated by my attending, Dr. Jeanell Sparrow, who had the patient's management and agrees with plan.  Final Clinical Impressions(s) / ED Diagnoses   Final diagnoses:  Lower urinary tract infectious  disease  Palpitations    ED Discharge Orders    None       Frederica Kuster, PA-C 01/17/18 2102    Pattricia Boss, MD 01/17/18 2108

## 2018-01-17 NOTE — H&P (Signed)
[name] HYW:[id number] DOB: 1940/10/02 DOA: 01/17/2018     PCP: [name], MD   Outpatient Specialists:     Rheumatology at [place]  Patient arrived to ER on 01/17/18 at 1533  Patient coming from: home Lives alone,    Chief Complaint:  Chief Complaint  Patient presents with  . Palpitations    HPI: [name] is a 77 y.o. female with medical history significant of diabetes, rheumatoid arthritis, fibromyalgia, emphysema, asthma, osa not on CPAP,     Presented with  Palpitations for the past 5 days. She felt fluttering unsure what brings it on. She have had some nausea and diarrhea for the past 4 days, diarrhea now improved now down to 2 BM's a day down from 5-6a day. 2 weeks ago she had some bright red blood per rectum x 2 turned the water red now resolved,  NO associated abdominal discomfort. She thought it was due to hemorrhoids. Dr. Cristina Gong is her GI doctor  Last colonoscopy was 2 years ago.  she has been under a lot of stress her Yolanda Bonine moved out and he was her caretaker.  Reports Right knee surgery in February denies any leg swelling, no chest pain. No travel hx. NO shortness of breath. No burning with urination. She walks with a walker or power chair have had decreased PO intake No one is able to buy her groceries No fever  At home but had low grade in ER.   Regarding pertinent Chronic problems: hx of DM does not check her BG  She is on Prednisone 5 mg a day for RA She has not had it for 5 days.  While in ER: Febrile 100.7 Hypotensive 90/56 Noted to have UTI   The following Work up has been ordered so far:  Orders Placed This Encounter  Procedures  . Blood Culture (routine x 2)  . Urine culture  . DG Chest 2 View  . Comprehensive metabolic panel  . CBC WITH DIFFERENTIAL  . Urinalysis, Routine w reflex microscopic  . Diet NPO time specified  . Cardiac monitoring  . Refer to Sidebar Report for: Sepsis Bundle ED/IP  . Document vital signs within 1-hour  of fluid bolus completion and notify provider of bolus completion  . Document Actual / Estimated Weight  . Insert peripheral IV x 2  . Initiate Carrier Fluid Protocol  . In and Out Cath  . Consult to hospitalist  . Pulse oximetry, continuous  . I-stat troponin, ED (not at [facility], [facility])  . EKG 12-Lead  . ED EKG 12-Lead    Following Medications were ordered in ER: Medications  cefTRIAXone (ROCEPHIN) 1 g in sodium chloride 0.9 % 100 mL IVPB (has no administration in time range)  sodium chloride 0.9 % bolus 1,000 mL (0 mLs Intravenous Stopped 01/17/18 1902)    And  sodium chloride 0.9 % bolus 1,000 mL (0 mLs Intravenous Stopped 01/17/18 1902)    And  sodium chloride 0.9 % bolus 500 mL (0 mLs Intravenous Stopped 01/17/18 1952)  acetaminophen (TYLENOL) tablet 650 mg (650 mg Oral Given 01/17/18 1715)    Significant initial  Findings: Abnormal Labs Reviewed  COMPREHENSIVE METABOLIC PANEL - Abnormal; Notable for the following components:      Result Value   Sodium 133 (*)    CO2 20 (*)    Glucose, Bld 116 (*)    Creatinine, Ser 1.05 (*)    Calcium 8.8 (*)    Total Protein 5.7 (*)  Albumin 2.7 (*)    GFR calc non Af Amer 50 (*)    GFR calc Af Amer 58 (*)    All other components within normal limits  CBC WITH DIFFERENTIAL/PLATELET - Abnormal; Notable for the following components:   Hemoglobin 10.3 (*)    HCT 35.4 (*)    MCH 23.4 (*)    MCHC 29.1 (*)    RDW 16.9 (*)    Abs Immature Granulocytes 0.11 (*)    All other components within normal limits  URINALYSIS, ROUTINE W REFLEX MICROSCOPIC - Abnormal; Notable for the following components:   APPearance HAZY (*)    Leukocytes, UA LARGE (*)    Bacteria, UA RARE (*)    All other components within normal limits     Na  133 K 4.5  Cr   stable,    Lab Results  Component Value Date   CREATININE 1.05 (H) 01/17/2018   CREATININE 1.16 (H) 04/08/2017   CREATININE 1.08 (H) 03/12/2017      WBC  8.8  HG/HCT    Down   from baseline  see below    Component Value Date/Time   HGB 10.3 (L) 01/17/2018 1630   HGB 13.3 08/30/2015 1649   HCT 35.4 (L) 01/17/2018 1630   HCT 41.2 08/30/2015 1649     Troponin (Point of Care Test) Recent Labs    01/17/18 1652  TROPIPOC 0.00      BNP (last 3 results) No results for input(s): BNP in the last 8760 hours.  ProBNP (last 3 results) No results for input(s): PROBNP in the last 8760 hours.  Lactic Acid, Venous    Component Value Date/Time   LATICACIDVEN 1.30 01/17/2018 1654      UA leukocytosis     CXR -  NON acute  ECG:  Personally reviewed by me showing: HR : 98  Rhythm:   Sinus tachycardia  Atrial premature complexes in couplets   nonspecific changes,  QTC 456     ED Triage Vitals  Enc Vitals Group     BP 01/17/18 1541 (!) 90/56     Pulse Rate 01/17/18 1541 95     Resp 01/17/18 1541 18     Temp 01/17/18 1541 (!) 100.7 F (38.2 C)     Temp Source 01/17/18 1541 Oral     SpO2 01/17/18 1536 94 %     Weight 01/17/18 1546 170 lb (77.1 kg)     Height 01/17/18 1546 5\' 5"  (1.651 m)     Head Circumference --      Peak Flow --      Pain Score 01/17/18 1546 0     Pain Loc --      Pain Edu? --      Excl. in [place]? --   TMAX(24)@       Latest  Blood pressure 119/83, pulse 93, temperature 100.3 F (37.9 C), temperature source Rectal, resp. rate (!) 27, height 5\' 5"  (1.651 m), weight 77.1 kg, SpO2 97 %.       Hospitalist was called for admission for palpitations, dehydration, right lower quadrant tenderness and SIRS   Review of Systems:    Pertinent positives include: fatigue,palpitations diarrhea worse than baseline, , abdominal pain, nausea,  Constitutional:  No weight loss, night sweats, Fevers, chills weight loss  HEENT:  No headaches, Difficulty swallowing,Tooth/dental problems,Sore throat,  No sneezing, itching, ear ache, nasal congestion, post nasal drip,  Cardio-vascular:  No chest pain, Orthopnea, PND, anasarca, dizziness, .no Bilateral lower  extremity swelling  GI:  No heartburn, indigestion vomiting,  change in bowel habits, loss of appetite, melena, blood in stool, hematemesis Resp:  no shortness of breath at rest. No dyspnea on exertion, No excess mucus, no productive cough, No non-productive cough, No coughing up of blood.No change in color of mucus.No wheezing. Skin:  no rash or lesions. No jaundice GU:  no dysuria, change in color of urine, no urgency or frequency. No straining to urinate.  No flank pain.  Musculoskeletal:  No joint pain or no joint swelling. No decreased range of motion. No back pain.  Psych:  No change in mood or affect. No depression or anxiety. No memory loss.  Neuro: no localizing neurological complaints, no tingling, no weakness, no double vision, no gait abnormality, no slurred speech, no confusion  All systems reviewed and apart from [place] all are negative  Past Medical History:   Past Medical History:  Diagnosis Date  . Asthma   . Complication of anesthesia    Anesthesia told her years ago she got too cold during surgery  . Diabetes mellitus without complication ([place])   . Emphysema   . Fibromyalgia   . Osteoporosis   . Rheumatoid arthritis(714.0)   . Sleep apnea    no cpap      Past Surgical History:  Procedure Laterality Date  . ABDOMINAL HYSTERECTOMY    . ANKLE FRACTURE SURGERY  2007  . BREAST SURGERY     mammoplasty and then removed  . FOOT SURGERY     numerous  . I&D EXTREMITY Right 10/26/2014   Procedure: IRRIGATION AND DEBRIDEMENT RIGHT KNEE ;  Surgeon: Gaynelle Arabian, MD;  Location: [facility];  Service: Orthopedics;  Laterality: Right;  . IRRIGATION AND DEBRIDEMENT KNEE Right 03/09/2017   Procedure: IRRIGATION AND DEBRIDEMENT RIGHT KNEE PRE-PATELLAR BURSA;  Surgeon: Susa Day, MD;  Location: [facility];  Service: Orthopedics;  Laterality: Right;  . KNEE BURSECTOMY Right 09/16/2014   Procedure: EXCISON OF PRE PATELLA BURSA RIGHT KNEE ;  Surgeon: Gaynelle Arabian, MD;  Location:  [facility];  Service: Orthopedics;  Laterality: Right;  . PARTIAL HYSTERECTOMY  1970  . TOTAL KNEE ARTHROPLASTY  2005 / 2006   x2    Social History:  Ambulatory  walker  wheelchair bound,       reports that she quit smoking about 23 years ago. She has never used smokeless tobacco. She reports that she does not drink alcohol or use drugs.     Family History:   Family History  Problem Relation Age of Onset  . Hypertension Unknown   . Diabetes Unknown   . Stroke Unknown   . Diabetes Mother   . Diabetes Father   . Diabetes Sister   . Diabetes Maternal Aunt   . Cancer Paternal Uncle   . Cancer Paternal Grandmother   . Breast cancer Paternal Grandmother     Allergies: Allergies  Allergen Reactions  . Penicillins Hives    Has patient had a PCN reaction causing immediate rash, facial/tongue/throat swelling, SOB or lightheadedness with hypotension: Yes Has patient had a PCN reaction causing severe rash involving mucus membranes or skin necrosis: No Has patient had a PCN reaction that required hospitalization: No Has patient had a PCN reaction occurring within the last 10 years: No If all of the above answers are "NO", then may proceed with Cephalosporin use.  . Sulfamethoxazole-Trimethoprim Nausea And Vomiting    REACTION: stomach problems  . Sulfonamide Derivatives Nausea And Vomiting  REACTION: stomach problems     Prior to Admission medications   Medication Sig Start Date End Date Taking? Authorizing Provider  Albuterol Sulfate (PROAIR RESPICLICK) 814 (90 Base) MCG/ACT AEPB Inhale 2 puffs into the lungs as needed (every four to six hours for cough or wheeze). 05/13/16  Yes Kozlow, Donnamarie Poag, MD  aspirin EC 81 MG tablet Take 81 mg by mouth 2 (two) times a week.    Yes [provider]  cholecalciferol (VITAMIN D) 1000 UNITS tablet Take 1,000 Units by mouth at bedtime.    Yes [provider]  Cyanocobalamin (B-12) 2500 MCG TABS Take 2,500 mcg by mouth at bedtime.     Yes [provider]  DULoxetine (CYMBALTA) 60 MG capsule Take 60 mg by mouth at bedtime.    Yes [provider]  ezetimibe (ZETIA) 10 MG tablet Take 10 mg by mouth at bedtime.  11/04/16  Yes [provider]  gabapentin (NEURONTIN) 100 MG capsule Take 100 mg by mouth at bedtime.   Yes [provider]  loratadine (CLARITIN) 10 MG tablet Take 10 mg by mouth at bedtime.    Yes [provider]  metFORMIN (GLUCOPHAGE) 500 MG tablet Take 1,000 mg by mouth at bedtime.  01/04/16  Yes [provider]  morphine (MSIR) 30 MG tablet Take 30 mg by mouth daily as needed for severe pain.   Yes [provider]  Multiple Vitamin (MULTIVITAMIN WITH MINERALS) TABS tablet Take 1 tablet by mouth at bedtime.   Yes [provider]  Omega-3 Fatty Acids (FISH OIL) 1200 MG CAPS Take 1,200 mg by mouth at bedtime.    Yes [provider]  ondansetron (ZOFRAN) 4 MG tablet Take 1 tablet (4 mg total) by mouth every 6 (six) hours as needed for nausea. 03/13/17  Yes Emokpae, Courage, MD  pantoprazole (PROTONIX) 40 MG tablet Take one tablet once every morning Patient taking differently: Take 40 mg by mouth at bedtime.  10/18/15  Yes Kozlow, Donnamarie Poag, MD  predniSONE (DELTASONE) 5 MG tablet Take 5 mg by mouth at bedtime. For rheumatoid arthritis 01/02/17  Yes [provider]  ranitidine (ZANTAC) 150 MG tablet TAKE TWO TABLETS EVERY EVENING Patient taking differently: Take 300 mg by mouth at bedtime.  01/23/16  Yes Kozlow, Donnamarie Poag, MD  beclomethasone (QVAR) 80 MCG/ACT inhaler Inhale 2 puffs into the lungs 2 (two) times daily. Patient not taking: Reported on 01/17/2018 01/18/16   Jiles Prows, MD  fluticasone furoate-vilanterol (BREO ELLIPTA) 200-25 MCG/INH AEPB Inhale 1 puff into the lungs daily. Patient not taking: Reported on 01/17/2018 08/30/15   Jiles Prows, MD  montelukast (SINGULAIR) 10 MG tablet TAKE ONE TABLET BY MOUTH ONCE DAILY Patient not  taking: Reported on 01/17/2018 02/03/17   Jiles Prows, MD  Oxycodone HCl 10 MG TABS Take 1 tablet (10 mg total) by mouth every 6 (six) hours as needed. Patient not taking: Reported on 01/17/2018 03/14/17   Blanchie Dessert, MD   Physical Exam: Blood pressure 119/83, pulse 93, temperature 100.3 F (37.9 C), temperature source Rectal, resp. rate (!) 27, height 5\' 5"  (1.651 m), weight 77.1 kg, SpO2 97 %. 1. General:  in No Acute distress   Chronically ill  -appearing 2. Psychological: Alert and   Oriented 3. Head/ENT:   Dry Mucous Membranes                          Head Non traumatic, neck  supple                           Poor Dentition 4. SKIN: decreased Skin turgor,  Skin clean Dry and intact no rash 5. Heart: Regular rate and rhythm no  Murmur, no Rub or gallop 6. Lungs: Clear to auscultation bilaterally, no wheezes or crackles   7. Abdomen: Soft, RLQ-tenderness, Non distended   Obese bowel sounds present 8. Lower extremities: no clubbing, cyanosis, or  edema 9. Neurologically Grossly intact, moving all 4 extremities equally   10. MSK: Normal range of motion, severe joint defemoties   LABS:     Recent Labs  Lab 01/17/18 1630  WBC 8.8  NEUTROABS 6.1  HGB 10.3*  HCT 35.4*  MCV 80.5  PLT 937   Basic Metabolic Panel: Recent Labs  Lab 01/17/18 1630  NA 133*  K 4.5  CL 103  CO2 20*  GLUCOSE 116*  BUN 14  CREATININE 1.05*  CALCIUM 8.8*      Recent Labs  Lab 01/17/18 1630  AST 17  ALT 13  ALKPHOS 76  BILITOT 1.1  PROT 5.7*  ALBUMIN 2.7*   No results for input(s): LIPASE, AMYLASE in the last 168 hours. No results for input(s): AMMONIA in the last 168 hours.    HbA1C: No results for input(s): HGBA1C in the last 72 hours. CBG: No results for input(s): GLUCAP in the last 168 hours.    Urine analysis:    Component Value Date/Time   COLORURINE YELLOW 01/17/2018 1900   APPEARANCEUR HAZY (A) 01/17/2018 1900   LABSPEC 1.012 01/17/2018 1900   PHURINE 5.0  01/17/2018 1900   GLUCOSEU NEGATIVE 01/17/2018 1900   GLUCOSEU NEGATIVE 12/24/2016 1239   HGBUR NEGATIVE 01/17/2018 1900   BILIRUBINUR NEGATIVE 01/17/2018 1900   KETONESUR NEGATIVE 01/17/2018 1900   PROTEINUR NEGATIVE 01/17/2018 1900   UROBILINOGEN 0.2 12/24/2016 1239   NITRITE NEGATIVE 01/17/2018 1900   LEUKOCYTESUR LARGE (A) 01/17/2018 1900      Cultures:    Component Value Date/Time   SDES WOUND RIGHT KNEE 03/09/2017 2022   SPECREQUEST NONE 03/09/2017 2022   CULT  03/09/2017 2022    RARE STAPHYLOCOCCUS SPECIES (COAGULASE NEGATIVE) RARE KOCURIA SPECIES RARE DIPHTHEROIDS(CORYNEBACTERIUM SPECIES) Standardized susceptibility testing for this organism is not available. NO ANAEROBES ISOLATED    REPTSTATUS 03/15/2017 FINAL 03/09/2017 2022     Radiological Exams on Admission: Dg Chest 2 View  Result Date: 01/17/2018 CLINICAL DATA:  Fever and heart palpitations. EXAM: CHEST - 2 VIEW COMPARISON:  03/09/2017 FINDINGS: Mild cardiomegaly. No focal consolidation or edema. No pleural effusion or pneumothorax. IMPRESSION: No active cardiopulmonary disease. Electronically Signed   By: Ulyses Jarred M.D.   On: 01/17/2018 17:30    Chart has been reviewed   Assessment/Plan   77 y.o. female with medical history significant of diabetes, rheumatoid arthritis, fibromyalgia, emphysema, asthma, osa not on CPAP,  Admitted for  SIRS, dehydration, hypotension  Present on Admission: Sirs -unclear etiology continue to monitor in stepdown for now until hemodynamically stable, obtain serial lactic acid, continue antibiotics ceftriaxone and metronidazole obtain CT of the abdomen to further evaluate for any intra-abdominal source of infection Obtain urine and blood cultures . Palpitations -monitor on telemetry, EKG initially showed PACs monitor electrolytes and replace as needed . Dehydration -setting of nausea vomiting dehydration from diarrhea will rehydrate and follow . Hypotension -in the setting of  noncompliance with steroids patient has been off her chronic steroids for  the past 5 days.  Will start hydrocortisone stress dose steroids and rehydrate.  Given recent surgery elevated d-dimer hypertension palpitations will obtain CT Angio of the chest to rule out PE . Symptom of blood in stool -this was 2 weeks ago patient thought was secondary to hemorrhoids stable but continue to monitor hemoglobin . Diarrhea in adult patient -patient states is somewhat improved she has chronic diarrhea but for the past few days has been much worse than her baseline.  Obtain gastric panel Given abdominal tenderness in the right lower quadrant will obtain CT of abdomen to evaluate for diverticulitis/appendicitis or any other intra-abdominal process patient is on long-term immunosuppressants if steroids which could mask underlying illness or infection  abnormal UA - no complaints of dysuria negative nitrites rare bacteria will obtain urine culture and monitor  Other plan as per orders.  DVT prophylaxis:    SCD Code Status:  FULL CODE   as per patient   I had personally discussed CODE STATUS with patient   Family Communication:   Family not  at  Bedside     Disposition Plan:    likely will need placement for rehabilitation                         Would benefit from PT/OT eval prior to DC  Ordered                                       Consults called: none   Admission status:   Obs    Level of care      SDU tele indefinitely please discontinue once patient no longer qualifies    Kamalei Roeder 01/17/2018, 11:51 PM    [facility]  Pager [phone]   after 2 AM please page floor coverage PA If 7AM-7PM, please contact the day team taking care of the patient  [email or web address]  Password [email or web address]

## 2018-01-17 NOTE — ED Triage Notes (Signed)
Pt reported having intermit fast heart rate for 5 days. Pt describes " Heart rate  as beating real hard" for the past 5 days. Pt had knee surgery in Feb.

## 2018-01-17 NOTE — ED Notes (Signed)
Patient is resting but says the bed is not comfortable.  RN advised patient to turn or shift and maybe that will help.  Offered to turn \\TV  on but she declined.

## 2018-01-17 NOTE — ED Notes (Signed)
PA advised patient was gong to be admitted so she could eat.  RN gave patient a sandwich bag and a ginger ale.

## 2018-01-18 ENCOUNTER — Observation Stay (HOSPITAL_BASED_OUTPATIENT_CLINIC_OR_DEPARTMENT_OTHER): Payer: Medicare Other

## 2018-01-18 ENCOUNTER — Other Ambulatory Visit: Payer: Self-pay

## 2018-01-18 ENCOUNTER — Observation Stay (HOSPITAL_COMMUNITY): Payer: Medicare Other

## 2018-01-18 DIAGNOSIS — G4733 Obstructive sleep apnea (adult) (pediatric): Secondary | ICD-10-CM | POA: Diagnosis present

## 2018-01-18 DIAGNOSIS — M797 Fibromyalgia: Secondary | ICD-10-CM | POA: Diagnosis present

## 2018-01-18 DIAGNOSIS — N390 Urinary tract infection, site not specified: Secondary | ICD-10-CM | POA: Diagnosis not present

## 2018-01-18 DIAGNOSIS — M069 Rheumatoid arthritis, unspecified: Secondary | ICD-10-CM | POA: Diagnosis present

## 2018-01-18 DIAGNOSIS — K921 Melena: Secondary | ICD-10-CM | POA: Diagnosis present

## 2018-01-18 DIAGNOSIS — Z803 Family history of malignant neoplasm of breast: Secondary | ICD-10-CM | POA: Diagnosis not present

## 2018-01-18 DIAGNOSIS — Z880 Allergy status to penicillin: Secondary | ICD-10-CM | POA: Diagnosis not present

## 2018-01-18 DIAGNOSIS — E119 Type 2 diabetes mellitus without complications: Secondary | ICD-10-CM | POA: Diagnosis present

## 2018-01-18 DIAGNOSIS — N281 Cyst of kidney, acquired: Secondary | ICD-10-CM | POA: Diagnosis not present

## 2018-01-18 DIAGNOSIS — I5030 Unspecified diastolic (congestive) heart failure: Secondary | ICD-10-CM | POA: Diagnosis not present

## 2018-01-18 DIAGNOSIS — R197 Diarrhea, unspecified: Secondary | ICD-10-CM | POA: Diagnosis present

## 2018-01-18 DIAGNOSIS — Z7984 Long term (current) use of oral hypoglycemic drugs: Secondary | ICD-10-CM | POA: Diagnosis not present

## 2018-01-18 DIAGNOSIS — T380X6A Underdosing of glucocorticoids and synthetic analogues, initial encounter: Secondary | ICD-10-CM | POA: Diagnosis present

## 2018-01-18 DIAGNOSIS — Z87891 Personal history of nicotine dependence: Secondary | ICD-10-CM | POA: Diagnosis not present

## 2018-01-18 DIAGNOSIS — R002 Palpitations: Secondary | ICD-10-CM | POA: Diagnosis not present

## 2018-01-18 DIAGNOSIS — E860 Dehydration: Secondary | ICD-10-CM

## 2018-01-18 DIAGNOSIS — Z7982 Long term (current) use of aspirin: Secondary | ICD-10-CM | POA: Diagnosis not present

## 2018-01-18 DIAGNOSIS — I959 Hypotension, unspecified: Secondary | ICD-10-CM | POA: Diagnosis not present

## 2018-01-18 DIAGNOSIS — I10 Essential (primary) hypertension: Secondary | ICD-10-CM | POA: Diagnosis present

## 2018-01-18 DIAGNOSIS — R6510 Systemic inflammatory response syndrome (SIRS) of non-infectious origin without acute organ dysfunction: Secondary | ICD-10-CM | POA: Diagnosis not present

## 2018-01-18 DIAGNOSIS — I9589 Other hypotension: Secondary | ICD-10-CM | POA: Diagnosis present

## 2018-01-18 DIAGNOSIS — M810 Age-related osteoporosis without current pathological fracture: Secondary | ICD-10-CM | POA: Diagnosis present

## 2018-01-18 DIAGNOSIS — J439 Emphysema, unspecified: Secondary | ICD-10-CM | POA: Diagnosis present

## 2018-01-18 DIAGNOSIS — Z96653 Presence of artificial knee joint, bilateral: Secondary | ICD-10-CM | POA: Diagnosis present

## 2018-01-18 DIAGNOSIS — Z833 Family history of diabetes mellitus: Secondary | ICD-10-CM | POA: Diagnosis not present

## 2018-01-18 DIAGNOSIS — Z882 Allergy status to sulfonamides status: Secondary | ICD-10-CM | POA: Diagnosis not present

## 2018-01-18 DIAGNOSIS — R000 Tachycardia, unspecified: Secondary | ICD-10-CM | POA: Diagnosis not present

## 2018-01-18 LAB — CBC
HCT: 30 % — ABNORMAL LOW (ref 36.0–46.0)
HEMOGLOBIN: 8.7 g/dL — AB (ref 12.0–15.0)
MCH: 23.6 pg — ABNORMAL LOW (ref 26.0–34.0)
MCHC: 29 g/dL — AB (ref 30.0–36.0)
MCV: 81.3 fL (ref 80.0–100.0)
NRBC: 0 % (ref 0.0–0.2)
Platelets: 317 10*3/uL (ref 150–400)
RBC: 3.69 MIL/uL — ABNORMAL LOW (ref 3.87–5.11)
RDW: 16.8 % — AB (ref 11.5–15.5)
WBC: 5.9 10*3/uL (ref 4.0–10.5)

## 2018-01-18 LAB — COMPREHENSIVE METABOLIC PANEL
ALBUMIN: 2.3 g/dL — AB (ref 3.5–5.0)
ALT: 13 U/L (ref 0–44)
AST: 17 U/L (ref 15–41)
Alkaline Phosphatase: 58 U/L (ref 38–126)
Anion gap: 9 (ref 5–15)
BILIRUBIN TOTAL: 0.3 mg/dL (ref 0.3–1.2)
BUN: 10 mg/dL (ref 8–23)
CHLORIDE: 113 mmol/L — AB (ref 98–111)
CO2: 16 mmol/L — ABNORMAL LOW (ref 22–32)
CREATININE: 0.84 mg/dL (ref 0.44–1.00)
Calcium: 8 mg/dL — ABNORMAL LOW (ref 8.9–10.3)
GFR calc Af Amer: >60 mL/min (ref >60)
GLUCOSE: 147 mg/dL — AB (ref 70–99)
POTASSIUM: 4.1 mmol/L (ref 3.5–5.1)
Sodium: 138 mmol/L (ref 135–145)
TOTAL PROTEIN: 5.2 g/dL — AB (ref 6.5–8.1)

## 2018-01-18 LAB — LACTIC ACID, PLASMA: LACTIC ACID, VENOUS: 1.7 mmol/L (ref 0.5–1.9)

## 2018-01-18 LAB — ECHOCARDIOGRAM COMPLETE
Height: 65 in
WEIGHTICAEL: 2720 [oz_av]

## 2018-01-18 LAB — MAGNESIUM: Magnesium: 1.6 mg/dL — ABNORMAL LOW (ref 1.7–2.4)

## 2018-01-18 LAB — GLUCOSE, CAPILLARY
GLUCOSE-CAPILLARY: 106 mg/dL — AB (ref 70–99)
Glucose-Capillary: 165 mg/dL — ABNORMAL HIGH (ref 70–99)
Glucose-Capillary: 268 mg/dL — ABNORMAL HIGH (ref 70–99)

## 2018-01-18 LAB — TROPONIN I

## 2018-01-18 LAB — PHOSPHORUS: Phosphorus: 3.3 mg/dL (ref 2.5–4.6)

## 2018-01-18 LAB — TSH: TSH: 0.892 u[IU]/mL (ref 0.350–4.500)

## 2018-01-18 MED ORDER — IOPAMIDOL (ISOVUE-370) INJECTION 76%
100.0000 mL | Freq: Once | INTRAVENOUS | Status: AC | PRN
  Administered 2018-01-18: 100 mL via INTRAVENOUS

## 2018-01-18 MED ORDER — HYDROCORTISONE NA SUCCINATE PF 100 MG IJ SOLR
50.0000 mg | Freq: Three times a day (TID) | INTRAMUSCULAR | Status: DC
  Administered 2018-01-18 – 2018-01-19 (×3): 50 mg via INTRAVENOUS
  Filled 2018-01-18 (×3): qty 2

## 2018-01-18 MED ORDER — HYPROMELLOSE (GONIOSCOPIC) 2.5 % OP SOLN
1.0000 [drp] | Freq: Four times a day (QID) | OPHTHALMIC | Status: DC | PRN
  Filled 2018-01-18: qty 15

## 2018-01-18 MED ORDER — DULOXETINE HCL 60 MG PO CPEP
60.0000 mg | ORAL_CAPSULE | Freq: Every day | ORAL | Status: DC
  Administered 2018-01-18: 60 mg via ORAL
  Filled 2018-01-18: qty 1

## 2018-01-18 MED ORDER — SODIUM CHLORIDE 0.9 % IV SOLN
INTRAVENOUS | Status: DC | PRN
  Administered 2018-01-18 – 2018-01-19 (×2): via INTRAVENOUS

## 2018-01-18 MED ORDER — GABAPENTIN 300 MG PO CAPS
300.0000 mg | ORAL_CAPSULE | Freq: Every day | ORAL | Status: DC
  Administered 2018-01-18: 300 mg via ORAL
  Filled 2018-01-18: qty 1

## 2018-01-18 MED ORDER — INSULIN ASPART 100 UNIT/ML ~~LOC~~ SOLN
0.0000 [IU] | Freq: Three times a day (TID) | SUBCUTANEOUS | Status: DC
  Administered 2018-01-18 – 2018-01-19 (×3): 2 [IU] via SUBCUTANEOUS

## 2018-01-18 MED ORDER — MONTELUKAST SODIUM 10 MG PO TABS
10.0000 mg | ORAL_TABLET | Freq: Every day | ORAL | Status: DC
  Administered 2018-01-18: 10 mg via ORAL
  Filled 2018-01-18: qty 1

## 2018-01-18 MED ORDER — PANTOPRAZOLE SODIUM 40 MG PO TBEC
40.0000 mg | DELAYED_RELEASE_TABLET | Freq: Every day | ORAL | Status: DC
  Administered 2018-01-18: 40 mg via ORAL
  Filled 2018-01-18: qty 1

## 2018-01-18 MED ORDER — IOPAMIDOL (ISOVUE-370) INJECTION 76%
INTRAVENOUS | Status: AC
  Filled 2018-01-18: qty 100

## 2018-01-18 MED ORDER — ACETAMINOPHEN 650 MG RE SUPP: 650.0000 mg | Freq: Four times a day (QID) | RECTAL | Status: DC | PRN

## 2018-01-18 MED ORDER — ONDANSETRON HCL 4 MG PO TABS: 4.0000 mg | ORAL_TABLET | Freq: Four times a day (QID) | ORAL | Status: DC | PRN

## 2018-01-18 MED ORDER — INSULIN ASPART 100 UNIT/ML ~~LOC~~ SOLN
0.0000 [IU] | Freq: Every day | SUBCUTANEOUS | Status: DC
  Administered 2018-01-18: 3 [IU] via SUBCUTANEOUS

## 2018-01-18 MED ORDER — ALPRAZOLAM 0.25 MG PO TABS
0.2500 mg | ORAL_TABLET | Freq: Three times a day (TID) | ORAL | Status: DC | PRN
  Administered 2018-01-18: 0.25 mg via ORAL
  Filled 2018-01-18: qty 1

## 2018-01-18 MED ORDER — ALBUTEROL SULFATE (2.5 MG/3ML) 0.083% IN NEBU: 2.5000 mg | INHALATION_SOLUTION | RESPIRATORY_TRACT | Status: DC | PRN

## 2018-01-18 MED ORDER — FLUTICASONE FUROATE-VILANTEROL 200-25 MCG/INH IN AEPB
1.0000 | INHALATION_SPRAY | Freq: Every day | RESPIRATORY_TRACT | Status: DC
  Filled 2018-01-18: qty 28

## 2018-01-18 MED ORDER — ACETAMINOPHEN 325 MG PO TABS: 650.0000 mg | ORAL_TABLET | Freq: Four times a day (QID) | ORAL | Status: DC | PRN

## 2018-01-18 MED ORDER — HYDROCODONE-ACETAMINOPHEN 5-325 MG PO TABS: 1.0000 | ORAL_TABLET | ORAL | Status: DC | PRN

## 2018-01-18 MED ORDER — ONDANSETRON HCL 4 MG/2ML IJ SOLN: 4.0000 mg | Freq: Four times a day (QID) | INTRAMUSCULAR | Status: DC | PRN

## 2018-01-18 MED ORDER — EZETIMIBE 10 MG PO TABS
10.0000 mg | ORAL_TABLET | Freq: Every day | ORAL | Status: DC
  Administered 2018-01-18: 10 mg via ORAL
  Filled 2018-01-18: qty 1

## 2018-01-18 MED ORDER — SODIUM CHLORIDE 0.9 % IV SOLN
INTRAVENOUS | Status: AC
  Administered 2018-01-18 (×2): via INTRAVENOUS

## 2018-01-18 MED ORDER — LORATADINE 10 MG PO TABS
10.0000 mg | ORAL_TABLET | Freq: Every day | ORAL | Status: DC
  Administered 2018-01-18: 10 mg via ORAL
  Filled 2018-01-18: qty 1

## 2018-01-18 MED ORDER — SODIUM CHLORIDE 0.9 % IV SOLN
1.0000 g | INTRAVENOUS | Status: DC
  Administered 2018-01-18: 1 g via INTRAVENOUS
  Filled 2018-01-18: qty 10

## 2018-01-18 MED ORDER — POLYVINYL ALCOHOL 1.4 % OP SOLN
1.0000 [drp] | Freq: Four times a day (QID) | OPHTHALMIC | Status: DC | PRN
  Administered 2018-01-18 (×2): 1 [drp] via OPHTHALMIC
  Filled 2018-01-18: qty 15

## 2018-01-18 NOTE — Evaluation (Signed)
Physical Therapy Evaluation Patient Details Name: [name] MRN: [id number] DOB: 1941-02-06 Today's Date: 01/18/2018   History of Present Illness  Pt is a 77 y.o. 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 Presents with SIRS and fever and hypotension.  Clinical Impression  Pt admitted with above diagnosis. Prior to admission, patient lives alone, is independent with ADL's, and mainly uses a wheelchair for mobility, although she is ambulatory with a walker. Denies recent falls. Ambulating 60 feet with walker and min guard assist. Presenting with abnormal posture, balance deficits, decreased activity tolerance, and weakness. Pt stated she is interested in assisted living facility placement due to decreased caregiver support and I think this is appropriate. Recommending HHPT at discharge. Will follow acutely.    Follow Up Recommendations Home health PT;Supervision - Intermittent    Equipment Recommendations  None recommended by PT    Recommendations for Other Services OT consult     Precautions / Restrictions Precautions Precautions: Fall Restrictions Weight Bearing Restrictions: No      Mobility  Bed Mobility Overal bed mobility: Modified Independent                Transfers Overall transfer level: Needs assistance Equipment used: Rolling walker (2 wheeled) Transfers: Sit to/from Stand Sit to Stand: Supervision            Ambulation/Gait Ambulation/Gait assistance: Min guard Gait Distance (Feet): 60 Feet Assistive device: Rolling walker (2 wheeled) Gait Pattern/deviations: Step-through pattern;Decreased stride length;Trunk flexed;Decreased weight shift to right   Gait velocity interpretation:  <1.8 ft/sec, indicate of risk for recurrent falls General Gait Details: Patient with trunk flexed posture throughout, slow cadence, and poor proximity to walker. No overt LOB  Stairs            Wheelchair Mobility    Modified Rankin (Stroke Patients Only)       Balance Overall balance assessment: Needs assistance Sitting-balance support: No upper extremity supported;Feet supported Sitting balance-Leahy Scale: Good     Standing balance support: Bilateral upper extremity supported Standing balance-Leahy Scale: Poor Standing balance comment: reliant on external support                             Pertinent Vitals/Pain Pain Assessment: Faces Faces Pain Scale: Hurts a little bit Pain Location: chronic bilateral shoulder pain Pain Descriptors / Indicators: Aching Pain Intervention(s): Monitored during session    Home Living Family/patient expects to be discharged to:: Private residence Living Arrangements: Alone Available Help at Discharge: Available PRN/intermittently Type of Home: Mobile home Home Access: Ramped entrance     Home Layout: One level Home Equipment: Environmental consultant - 2 wheels;Wheelchair - manual      Prior Function Level of Independence: Independent with assistive device(s)         Comments: Reports using wheelchair mainly for household and community mobility (secondary to RA) although is ambulatory using walker. Drives and independent with ADL's.     Hand Dominance        Extremity/Trunk Assessment   Upper Extremity Assessment Upper Extremity Assessment: Generalized weakness;RUE deficits/detail;LUE deficits/detail RUE Deficits / Details: Arthritic changes with increased finger/wrist flexion LUE Deficits / Details: Arthritic changes with increased finger/wrist flexion    Lower Extremity  Assessment Lower Extremity Assessment: RLE deficits/detail;LLE deficits/detail RLE Deficits / Details: Grossly 4/5 LLE Deficits / Details: Strength  5/5     Cervical / Trunk Assessment Cervical / Trunk Assessment: Kyphotic  Communication   Communication: No difficulties  Cognition Arousal/Alertness: Awake/alert Behavior During Therapy: WFL for tasks assessed/performed Overall Cognitive Status: Within Functional Limits for tasks assessed                                        General Comments      Exercises     Assessment/Plan    PT Assessment Patient needs continued PT services  PT Problem List Decreased strength;Decreased range of motion;Decreased activity tolerance;Decreased balance;Decreased mobility       PT Treatment Interventions DME instruction;Gait training;Functional mobility training;Therapeutic activities;Therapeutic exercise;Balance training;Patient/family education    PT Goals (Current goals can be found in the Care Plan section)  Acute Rehab PT Goals Patient Stated Goal: "my daughter wants me to go to an assisted living." PT Goal Formulation: With patient Time For Goal Achievement: 02/01/18 Potential to Achieve Goals: Good    Frequency Min 3X/week   Barriers to discharge Decreased caregiver support      Co-evaluation               AM-PAC PT "6 Clicks" Daily Activity  Outcome Measure Difficulty turning over in bed (including adjusting bedclothes, sheets and blankets)?: None Difficulty moving from lying on back to sitting on the side of the bed? : A Little Difficulty sitting down on and standing up from a chair with arms (e.g., wheelchair, bedside commode, etc,.)?: A Little Help needed moving to and from a bed to chair (including a wheelchair)?: A Little Help needed walking in hospital room?: A Little Help needed climbing 3-5 steps with a railing? : A Lot 6 Click Score: 18    End of Session Equipment Utilized During Treatment: Gait belt Activity Tolerance: Patient tolerated treatment well Patient left: in bed;with call bell/phone within reach   PT Visit Diagnosis:  Unsteadiness on feet (R26.81);Other abnormalities of gait and mobility (R26.89);Difficulty in walking, not elsewhere classified (R26.2)    Time: 1210-1230 PT Time Calculation (min) (ACUTE ONLY): 20 min   Charges:   PT Evaluation $PT Eval Moderate Complexity: 1 Mod         Ellamae Sia, [place], DPT Acute Rehabilitation Services Pager [phone] Office [phone]  Willy Eddy 01/18/2018, 1:09 PM

## 2018-01-18 NOTE — ED Notes (Signed)
Patient transported to CT 

## 2018-01-18 NOTE — ED Notes (Signed)
Ordered breakfast tray  

## 2018-01-18 NOTE — Progress Notes (Signed)
  Echocardiogram 2D Echocardiogram has been performed.  [name] 01/18/2018, 9:01 AM

## 2018-01-18 NOTE — ED Notes (Signed)
Attempted to call report

## 2018-01-18 NOTE — ED Notes (Signed)
Patient is resting comfortably. 

## 2018-01-18 NOTE — Care Management Note (Addendum)
Case Management Note  Patient Details  Name: [name] MRN: [id number] Date of Birth: 1940/12/14  Subjective/Objective:       Pt presented from home for SIRS/fever of unknown origin.  Pt states she plans to return home tomorrow, but worried about going home alone.  Pt's grandson and girlfriend had been living with her to help her during recovery from her recent surgeries.  After a conflict, grandson and girlfriend moved out 2 days prior to her hospitalization.  Pt's son lives across the street but works 8-12 hours/day and does not offer help at night. Pt drives to grocery store to pick up groceries online, but has trouble getting them into the house.  Pt is interested in going to an ALF or having someone stay with her to help with errands, housecleaning, etc.    Pt has walker and a borrower power wheelchair at home.  Pt has used [facility] for all her home health needs during the last few surgeries.           Action/Plan: Discussed with Tommi Rumps from [place] to assess for eligibility for [employer] program.  Patient lives in [place], where HomeFirst is not available. 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     Discussed other options with patient, including ALF and personal care assistant.   Pt states she is in the process of getting a powered WC of her own through her PCP. She has been trying to arrange a PT eval with [facility] for several weeks and her information has been lost.  Pt asks if PT can do the eval for a powered WC while she is in hospital.   CM will reassess tomorrow.   Expected Discharge Date:       11/11 or 11/12           Expected Discharge Plan:  [place]  In-House Referral:  NA  Discharge planning Services  CM Consult  Post Acute Care Choice:  Home Health Choice offered to:  Patient  DME Arranged:  N/A DME Agency:     HH Arranged:    [place] Agency:     Status of Service:  In process,  will continue to follow  If discussed at Long Length of Stay Meetings, dates discussed:    Additional Comments:  Claudie Leach, RN 01/18/2018, 5:38 PM

## 2018-01-18 NOTE — Progress Notes (Signed)
New pt admission from ED. Pt brought to the floor in stable condition. Vitals taken. Initial Assessment done. All immediate pertinent needs to patient addressed. Patient Guide given to patient. Important safety instructions relating to hospitalization reviewed with patient. Patient verbalized understanding.    Talked with pt's daughter on [place] over phone, she was concerned about her cognitive status, since she forget to take her medicine for 6 days at home, will let MD aware about her placement once she is medically stable.  Palma Holter, RN

## 2018-01-18 NOTE — Progress Notes (Signed)
Patient ID: [name], female   DOB: 19-Oct-1940, 77 y.o.   MRN: [id number]  PROGRESS NOTE    [name]  XNT:[id number] DOB: 1940-10-12 DOA: 01/17/2018 PCP: [name], MD   Brief Narrative:  77 year old female with history of diabetes, rheumatoid arthritis, fibromyalgia, emphysema, asthma, OSA not on CPAP, bilateral TKA with prosthetic joint infection with multiple surgeries to her right knee, most latest being in May 2019 when she had right knee spacer removal and total knee revision with extensor mechanism repair at [facility] presented with palpitations along with weakness.  She was found to have a low-grade temperature in the ED along with dehydration and hypotension.  She was started on IV fluids  Assessment & Plan:   Active Problems:   Palpitations   Dehydration   Hypotension   Symptom of blood in stool   Diarrhea in adult patient  SIRS presenting with fever and hypotension -Unclear etiology.  May be part of viral syndrome.  Patient was started on Rocephin and Flagyl on admission.  CT scan of chest abdomen and pelvis shows no signs of any infection -Discontinue Flagyl.  Follow cultures.  Continue Rocephin for 1 more day -Blood pressure improving.  Decrease hydrocortisone to 50 mg IV every 8 hours and rapidly taper.  Patient had missed home dose of steroids over the last few days.  Palpitations -May be secondary to dehydration.  Heart rate stable.  Follow echo.  Might need outpatient cardiology evaluation  Diarrhea -Seems to have resolved.  No diarrhea for the last 6 to 7 days.  Will discontinue order for stool testing  Abnormal UA -Patient denies any urinary symptoms but will continue Rocephin for now because of fever, hypotension on admission.  Follow urine culture  Dehydration -Treated with IV fluids.  Improving  Rheumatoid arthritis -Outpatient follow-up  Bilateral TKA with prosthetic joint infection with multiple surgeries to her right knee, mostly it is being in May  2019 when she had right knee spacer removal and total knee revision with extensor mechanism repair -Outpatient follow-up with [facility] -Right knee looks stable.   DVT prophylaxis: SCDs Code Status: Full Family Communication: None at bedside Disposition Plan: Home in 1 to 2 days if clinically stable  Consultants: None  Procedures: None  Antimicrobials: Rocephin and Flagyl on 01/17/2018 onwards   Subjective: Patient seen and examined at bedside.  She feels slightly better.  Has more energy.  Feels hungry.  Denies any current fever, nausea, vomiting or abdominal pain.  Objective: Vitals:   01/18/18 0615 01/18/18 0700 01/18/18 0800 01/18/18 0931  BP: 125/63 125/67 122/64 (!) 115/54  Pulse: 80 83 85 83  Resp: (!) 22 (!) 26 20 18   Temp:    98.2 F (36.8 C)  TempSrc:    Oral  SpO2: 100% 100% 100% 100%  Weight:      Height:        Intake/Output Summary (Last 24 hours) at 01/18/2018 1049 Last data filed at 01/18/2018 0216 Gross per 24 hour  Intake 3071.06 ml  Output 1000 ml  Net 2071.06 ml   Filed Weights   01/17/18 1546  Weight: 77.1 kg    Examination:  General exam: Appears calm and comfortable  Respiratory system: Bilateral decreased breath sounds at bases Cardiovascular system: S1 & S2 heard, Rate controlled Gastrointestinal system: Abdomen is nondistended, soft and nontender. Normal bowel sounds heard. Extremities: No cyanosis, clubbing, edema.  Bilateral knees with healed scar.  No erythema or warmth.  Bilateral fingers with rheumatoid deformities.  Data Reviewed: I have personally reviewed following labs and imaging studies  CBC: Recent Labs  Lab 01/17/18 1630 01/18/18 1020  WBC 8.8 5.9  NEUTROABS 6.1  --   HGB 10.3* 8.7*  HCT 35.4* 30.0*  MCV 80.5 81.3  PLT 334 762   Basic Metabolic Panel: Recent Labs  Lab 01/17/18 1630  NA 133*  K 4.5  CL 103  CO2 20*  GLUCOSE 116*  BUN 14  CREATININE 1.05*  CALCIUM 8.8*   GFR: Estimated Creatinine  Clearance: 46.8 mL/min (A) (by C-G formula based on SCr of 1.05 mg/dL (H)). Liver Function Tests: Recent Labs  Lab 01/17/18 1630  AST 17  ALT 13  ALKPHOS 76  BILITOT 1.1  PROT 5.7*  ALBUMIN 2.7*   No results for input(s): LIPASE, AMYLASE in the last 168 hours. No results for input(s): AMMONIA in the last 168 hours. Coagulation Profile: No results for input(s): INR, PROTIME in the last 168 hours. Cardiac Enzymes: Recent Labs  Lab 01/17/18 2137 01/18/18 0348  TROPONINI <0.03 <0.03   BNP (last 3 results) No results for input(s): PROBNP in the last 8760 hours. HbA1C: No results for input(s): HGBA1C in the last 72 hours. CBG: No results for input(s): GLUCAP in the last 168 hours. Lipid Profile: No results for input(s): CHOL, HDL, LDLCALC, TRIG, CHOLHDL, LDLDIRECT in the last 72 hours. Thyroid Function Tests: No results for input(s): TSH, T4TOTAL, FREET4, T3FREE, THYROIDAB in the last 72 hours. Anemia Panel: No results for input(s): VITAMINB12, FOLATE, FERRITIN, TIBC, IRON, RETICCTPCT in the last 72 hours. Sepsis Labs: Recent Labs  Lab 01/17/18 1654 01/17/18 2137  LATICACIDVEN 1.30 1.7    Recent Results (from the past 240 hour(s))  Blood Culture (routine x 2)     Status: None (Preliminary result)   Collection Time: 01/17/18  4:02 PM  Result Value Ref Range Status   Specimen Description BLOOD BLOOD RIGHT HAND  Final   Special Requests   Final    BOTTLES DRAWN AEROBIC ONLY Blood Culture results may not be optimal due to an inadequate volume of blood received in culture bottles   Culture   Final    NO GROWTH < 24 HOURS Performed at [facility] [address], [place], [place] [address]    Report Status PENDING  Incomplete  Blood Culture (routine x 2)     Status: None (Preliminary result)   Collection Time: 01/17/18  4:30 PM  Result Value Ref Range Status   Specimen Description BLOOD LEFT ANTECUBITAL  Final   Special Requests   Final    BOTTLES DRAWN AEROBIC  AND ANAEROBIC Blood Culture adequate volume   Culture   Final    NO GROWTH < 24 HOURS Performed at [facility], [place] [address], [place], [place] [address]    Report Status PENDING  Incomplete         Radiology Studies: Dg Chest 2 View  Result Date: 01/17/2018 CLINICAL DATA:  Fever and heart palpitations. EXAM: CHEST - 2 VIEW COMPARISON:  03/09/2017 FINDINGS: Mild cardiomegaly. No focal consolidation or edema. No pleural effusion or pneumothorax. IMPRESSION: No active cardiopulmonary disease. Electronically Signed   By: Ulyses Jarred M.D.   On: 01/17/2018 17:30   Ct Angio Chest Pe W Or Wo Contrast  Result Date: 01/18/2018 CLINICAL DATA:  Acute onset of chills and palpitations. Fever and tachycardia. Intermittent hypotension. Recently diagnosed with urinary tract infection. EXAM: CT ANGIOGRAPHY CHEST CT ABDOMEN AND PELVIS WITH CONTRAST TECHNIQUE: Multidetector  CT imaging of the chest was performed using the standard protocol during bolus administration of intravenous contrast. Multiplanar CT image reconstructions and MIPs were obtained to evaluate the vascular anatomy. Multidetector CT imaging of the abdomen and pelvis was performed using the standard protocol during bolus administration of intravenous contrast. CONTRAST:  135mL ISOVUE-370 IOPAMIDOL (ISOVUE-370) INJECTION 76% COMPARISON:  CT of the abdomen and pelvis performed 10/25/2005, and chest radiograph performed 01/17/2018 FINDINGS: CTA CHEST FINDINGS Cardiovascular: There is no evidence of significant pulmonary embolus. Scattered coronary artery calcifications are seen. The heart is normal in size. Scattered calcification is noted along the thoracic aorta and proximal great vessels. Mediastinum/Nodes: Visualized mediastinal nodes remain borderline normal in size. No pericardial effusion is identified. The visualized portions of the thyroid gland are unremarkable. No axillary lymphadenopathy is seen. Lungs/Pleura: Mild bilateral  atelectasis is noted. 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 No significant pleural effusion or pneumothorax is identified. No masses are seen. Musculoskeletal: No acute osseous abnormalities are identified. The visualized musculature is unremarkable in appearance. Review of the MIP images confirms the above findings. CT ABDOMEN and PELVIS FINDINGS Hepatobiliary: The liver is unremarkable in appearance. The gallbladder is unremarkable in appearance. The common bile duct remains normal in caliber. Pancreas: The pancreas is within normal limits. Spleen: Cysts are noted within the spleen, measuring up to 5.6 cm in size. Adrenals/Urinary Tract: The adrenal glands are unremarkable in appearance. Nonspecific perinephric stranding is noted bilaterally. A few tiny bilateral renal cysts are seen. There is no evidence of hydronephrosis. No renal or ureteral stones are identified. Stomach/Bowel: The stomach is unremarkable in appearance. The small bowel is within normal limits. The appendix is not visualized; there is no evidence for appendicitis. Mild diverticulosis is noted along the sigmoid colon, without evidence of diverticulitis. Vascular/Lymphatic: Diffuse calcification is seen along the abdominal aorta and its branches. The abdominal aorta is otherwise grossly unremarkable. The inferior vena cava is grossly unremarkable. No retroperitoneal lymphadenopathy is seen. No pelvic sidewall lymphadenopathy is identified. Reproductive: The bladder is mildly distended and grossly unremarkable. The patient is status post hysterectomy. No suspicious adnexal masses are seen. The ovaries are relatively symmetric. Other: No additional soft tissue abnormalities are seen. Musculoskeletal: No acute osseous abnormalities are identified. Endplate sclerotic change and vacuum phenomenon are noted along the lumbar spine. The visualized musculature is unremarkable in appearance. Review of the MIP images confirms the above findings. IMPRESSION: 1.  No evidence of significant pulmonary embolus. 2. Mild bilateral atelectasis noted. 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 3. Scattered coronary artery calcifications. 4. Cysts within the spleen, measuring up to 5.6 cm in size. Few tiny bilateral renal cysts. 5. Mild diverticulosis along the sigmoid colon, without evidence of diverticulitis. Aortic Atherosclerosis (ICD10-I70.0). Electronically Signed   By: Garald Balding M.D.   On: 01/18/2018 01:28   Ct Abdomen Pelvis W Contrast  Result Date: 01/18/2018 CLINICAL DATA:  Acute onset of chills and palpitations. Fever and tachycardia. Intermittent hypotension. Recently diagnosed with urinary tract infection. EXAM: CT ANGIOGRAPHY CHEST CT ABDOMEN AND PELVIS WITH CONTRAST TECHNIQUE: Multidetector CT imaging of the chest was performed using the standard protocol during bolus administration of intravenous contrast. Multiplanar CT image reconstructions and MIPs were obtained to evaluate the vascular anatomy. Multidetector CT imaging of the abdomen and pelvis was performed using the standard protocol during bolus administration of intravenous contrast. CONTRAST:  154mL ISOVUE-370 IOPAMIDOL (ISOVUE-370) INJECTION 76% COMPARISON:  CT of the abdomen and pelvis performed 10/25/2005, and chest radiograph performed 01/17/2018 FINDINGS: CTA CHEST FINDINGS Cardiovascular: There is  no evidence of significant pulmonary embolus. Scattered coronary artery calcifications are seen. The heart is normal in size. Scattered calcification is noted along the thoracic aorta and proximal great vessels. Mediastinum/Nodes: Visualized mediastinal nodes remain borderline normal in size. No pericardial effusion is identified. The visualized portions of the thyroid gland are unremarkable. No axillary lymphadenopathy is seen. Lungs/Pleura: Mild bilateral atelectasis is noted. 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 No significant pleural effusion or pneumothorax is identified. No masses are seen. Musculoskeletal:  No acute osseous abnormalities are identified. The visualized musculature is unremarkable in appearance. Review of the MIP images confirms the above findings. CT ABDOMEN and PELVIS FINDINGS Hepatobiliary: The liver is unremarkable in appearance. The gallbladder is unremarkable in appearance. The common bile duct remains normal in caliber. Pancreas: The pancreas is within normal limits. Spleen: Cysts are noted within the spleen, measuring up to 5.6 cm in size. Adrenals/Urinary Tract: The adrenal glands are unremarkable in appearance. Nonspecific perinephric stranding is noted bilaterally. A few tiny bilateral renal cysts are seen. There is no evidence of hydronephrosis. No renal or ureteral stones are identified. Stomach/Bowel: The stomach is unremarkable in appearance. The small bowel is within normal limits. The appendix is not visualized; there is no evidence for appendicitis. Mild diverticulosis is noted along the sigmoid colon, without evidence of diverticulitis. Vascular/Lymphatic: Diffuse calcification is seen along the abdominal aorta and its branches. The abdominal aorta is otherwise grossly unremarkable. The inferior vena cava is grossly unremarkable. No retroperitoneal lymphadenopathy is seen. No pelvic sidewall lymphadenopathy is identified. Reproductive: The bladder is mildly distended and grossly unremarkable. The patient is status post hysterectomy. No suspicious adnexal masses are seen. The ovaries are relatively symmetric. Other: No additional soft tissue abnormalities are seen. Musculoskeletal: No acute osseous abnormalities are identified. Endplate sclerotic change and vacuum phenomenon are noted along the lumbar spine. The visualized musculature is unremarkable in appearance. Review of the MIP images confirms the above findings. IMPRESSION: 1. No evidence of significant pulmonary embolus. 2. Mild bilateral atelectasis noted. 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 3. Scattered coronary artery  calcifications. 4. Cysts within the spleen, measuring up to 5.6 cm in size. Few tiny bilateral renal cysts. 5. Mild diverticulosis along the sigmoid colon, without evidence of diverticulitis. Aortic Atherosclerosis (ICD10-I70.0). Electronically Signed   By: Garald Balding M.D.   On: 01/18/2018 01:28        Scheduled Meds: . DULoxetine  60 mg Oral QHS  . ezetimibe  10 mg Oral QHS  . gabapentin  300 mg Oral QHS  . hydrocortisone sod succinate (SOLU-CORTEF) inj  100 mg Intravenous Q8H  . insulin aspart  0-5 Units Subcutaneous QHS  . insulin aspart  0-9 Units Subcutaneous TID WC  . iopamidol      . loratadine  10 mg Oral QHS  . pantoprazole  40 mg Oral QHS   Continuous Infusions: . sodium chloride    . sodium chloride 100 mL/hr at 01/18/18 0522  . cefTRIAXone (ROCEPHIN)  IV    . metronidazole Stopped (01/18/18 0355)     LOS: 0 days        Aline August, MD [facility] Pager [phone]  If 7PM-7AM, please contact night-coverage [email or web address] Password [email or web address] 01/18/2018, 10:49 AM

## 2018-01-19 ENCOUNTER — Telehealth: Payer: Self-pay | Admitting: Allergy and Immunology

## 2018-01-19 DIAGNOSIS — R6510 Systemic inflammatory response syndrome (SIRS) of non-infectious origin without acute organ dysfunction: Secondary | ICD-10-CM

## 2018-01-19 DIAGNOSIS — N390 Urinary tract infection, site not specified: Secondary | ICD-10-CM

## 2018-01-19 LAB — COMPREHENSIVE METABOLIC PANEL
ALT: 12 U/L (ref 0–44)
AST: 15 U/L (ref 15–41)
Albumin: 2.6 g/dL — ABNORMAL LOW (ref 3.5–5.0)
Alkaline Phosphatase: 75 U/L (ref 38–126)
Anion gap: 7 (ref 5–15)
BILIRUBIN TOTAL: 0.6 mg/dL (ref 0.3–1.2)
BUN: 11 mg/dL (ref 8–23)
CALCIUM: 8.3 mg/dL — AB (ref 8.9–10.3)
CO2: 19 mmol/L — ABNORMAL LOW (ref 22–32)
CREATININE: 0.87 mg/dL (ref 0.44–1.00)
Chloride: 114 mmol/L — ABNORMAL HIGH (ref 98–111)
Glucose, Bld: 181 mg/dL — ABNORMAL HIGH (ref 70–99)
Potassium: 4.1 mmol/L (ref 3.5–5.1)
Sodium: 140 mmol/L (ref 135–145)
TOTAL PROTEIN: 5.7 g/dL — AB (ref 6.5–8.1)

## 2018-01-19 LAB — CBC WITH DIFFERENTIAL/PLATELET
ABS IMMATURE GRANULOCYTES: 0.06 10*3/uL (ref 0.00–0.07)
Basophils Absolute: 0 10*3/uL (ref 0.0–0.1)
Basophils Relative: 1 %
Eosinophils Absolute: 0 10*3/uL (ref 0.0–0.5)
Eosinophils Relative: 1 %
HCT: 31.6 % — ABNORMAL LOW (ref 36.0–46.0)
Hemoglobin: 9 g/dL — ABNORMAL LOW (ref 12.0–15.0)
Immature Granulocytes: 1 %
LYMPHS PCT: 13 %
Lymphs Abs: 1.1 10*3/uL (ref 0.7–4.0)
MCH: 23.4 pg — AB (ref 26.0–34.0)
MCHC: 28.5 g/dL — ABNORMAL LOW (ref 30.0–36.0)
MCV: 82.1 fL (ref 80.0–100.0)
MONO ABS: 0.5 10*3/uL (ref 0.1–1.0)
MONOS PCT: 5 %
NEUTROS ABS: 7 10*3/uL (ref 1.7–7.7)
Neutrophils Relative %: 79 %
Platelets: 322 10*3/uL (ref 150–400)
RBC: 3.85 MIL/uL — ABNORMAL LOW (ref 3.87–5.11)
RDW: 16.9 % — ABNORMAL HIGH (ref 11.5–15.5)
WBC: 8.8 10*3/uL (ref 4.0–10.5)
nRBC: 0 % (ref 0.0–0.2)

## 2018-01-19 LAB — GLUCOSE, CAPILLARY
GLUCOSE-CAPILLARY: 152 mg/dL — AB (ref 70–99)
GLUCOSE-CAPILLARY: 185 mg/dL — AB (ref 70–99)

## 2018-01-19 LAB — URINE CULTURE

## 2018-01-19 LAB — MAGNESIUM: MAGNESIUM: 1.7 mg/dL (ref 1.7–2.4)

## 2018-01-19 MED ORDER — HYDROCORTISONE NA SUCCINATE PF 100 MG IJ SOLR: 25.0000 mg | Freq: Two times a day (BID) | INTRAMUSCULAR | Status: DC

## 2018-01-19 MED ORDER — CEPHALEXIN 500 MG PO CAPS: 500.0000 mg | ORAL_CAPSULE | Freq: Three times a day (TID) | ORAL | 0 refills | Status: AC

## 2018-01-19 MED ORDER — HYDROCORTISONE NA SUCCINATE PF 100 MG IJ SOLR: 25.0000 mg | Freq: Three times a day (TID) | INTRAMUSCULAR | Status: DC

## 2018-01-19 MED ORDER — ONDANSETRON HCL 4 MG PO TABS: 4.0000 mg | ORAL_TABLET | Freq: Four times a day (QID) | ORAL | 0 refills | Status: DC | PRN

## 2018-01-19 NOTE — Telephone Encounter (Signed)
Pt called and said that she needed some eye drops called in because her eyes are itching and red and hurting. And she is just getting out of the hospital and can not come in. [place] . [phone].

## 2018-01-19 NOTE — Consult Note (Signed)
            [facility] CM Primary Care Navigator  01/19/2018  [name] Sep 09, 1940 [id number]   Attempt to seepatient at the bedside to identify possible discharge needs butshewasalreadydischarged today. Patient was discharged home on oral antibiotics and with home health services.  Per MD note,patient presented with palpitations along with weakness. (SIRS- systemic inflammation response syndrome, palpitations, dehydration, hypotension, diarrhea in adult patient)  Patient has discharge instruction to follow-up withprimary care provider in 1 week, as well as cardiology follow-up in 1 week.  Primary care provider's office is listed as providing transition of care (TOC) follow-up.    For additional questions please contact:  Edwena Felty A. Eivin Mascio, BSN, RN-BC [facility] PRIMARY CARE Navigator Cell: [phone]

## 2018-01-19 NOTE — Progress Notes (Signed)
   Patient presents with SIRS and fever of unknown origin with hx of RA which impairs their ability to perform ADLs and ambulation in the home.  Pt mainly uses w/c for mobility but has been borrowing one from a friend. Pt limited in her ability to use RW for ambulation. A wheelchair will allow patient to safely perform daily activities and decrease fall risk. The patient can self propel in the home or has a caregiver who can provide assistance.      Wray Kearns, PT, DPT Acute Rehabilitation Services Pager [phone] Office [phone]

## 2018-01-19 NOTE — Telephone Encounter (Signed)
Please ask patient what eyedrops have been successful in the past and prescribed those eyedrops for her.

## 2018-01-19 NOTE — Discharge Summary (Signed)
Physician Discharge Summary  [name] IPJ:[id number] DOB: 01-Sep-1940 DOA: 01/17/2018  PCP: [name], MD  Admit date: 01/17/2018 Discharge date: 01/19/2018  Admitted From: Home Disposition: Home  Recommendations for Outpatient Follow-up:  1. Follow up with PCP in 1 week 2. Outpatient follow-up with cardiology/Dr. Einar Gip 3. Follow-up in the ED if symptoms worsen or new appear   Home Health: Yes: PT Equipment/Devices: None  Discharge Condition: Stable CODE STATUS: Full Diet recommendation: Heart Healthy / Carb Modified    Brief/Interim Summary: 77 year old female with history of diabetes, rheumatoid arthritis, fibromyalgia, emphysema, asthma, OSA not on CPAP, bilateral TKA with prosthetic joint infection with multiple surgeries to her right knee, most latest being in May 2019 when she had right knee spacer removal and total knee revision with extensor mechanism repair at [place] [facility] presented with palpitations along with weakness.  She was found to have a low-grade temperature in the ED along with dehydration and hypotension.  She was started on IV fluids and antibiotics.  Her condition has much improved.  Cultures have been negative so far.  Heart rate has remained stable.  She will be discharged home on oral antibiotics.  Discharge Diagnoses:  Active Problems:   Palpitations   Dehydration   Hypotension   Symptom of blood in stool   Diarrhea in adult patient  SIRS presenting with fever and hypotension -Unclear etiology.  May be part of viral syndrome versus UTI.  Patient was started on Rocephin and Flagyl on admission.  CT scan of chest abdomen and pelvis shows no signs of any infection.  Flagyl discontinued.  Currently on Rocephin.  Antibiotic plan as below. -Also started on intravenous hydrocortisone which is being tapered -Hemodynamically stable.  Palpitations -May be secondary to dehydration.  Heart rate stable.    Echo showed EF of 60 to 65% with no regional wall motion  abnormalities; grade 1 diastolic dysfunction.  Recommend outpatient follow-up with cardiology/Dr. Einar Gip  Diarrhea -Seems to have resolved.  No diarrhea for the last 6 to 7 days.    Abnormal UA/probable UTI -Patient complains of only follow odor.  Cultures negative so far.  Currently on Rocephin.  Will discharge on oral Keflex.    Diabetes mellitus type 2 -Continue home regimen.  Outpatient follow-up  Dehydration -Treated with IV fluids.  Improved  Rheumatoid arthritis -Outpatient follow-up  Bilateral TKA with prosthetic joint infection with multiple surgeries to her right knee, mostly it is being in May 2019 when she had right knee spacer removal and total knee revision with extensor mechanism repair -Outpatient follow-up with [facility] -Right knee looks stable.  Discharge Instructions  Discharge Instructions    Call MD for:  difficulty breathing, headache or visual disturbances   Complete by:  As directed    Call MD for:  extreme fatigue   Complete by:  As directed    Call MD for:  hives   Complete by:  As directed    Call MD for:  persistant dizziness or light-headedness   Complete by:  As directed    Call MD for:  persistant nausea and vomiting   Complete by:  As directed    Call MD for:  severe uncontrolled pain   Complete by:  As directed    Call MD for:  temperature >100.4   Complete by:  As directed    Diet - low sodium heart healthy   Complete by:  As directed    Diet Carb Modified   Complete by:  As directed  Increase activity slowly   Complete by:  As directed      Allergies as of 01/19/2018      Reactions   Penicillins Hives   Has patient had a PCN reaction causing immediate rash, facial/tongue/throat swelling, SOB or lightheadedness with hypotension: Yes Has patient had a PCN reaction causing severe rash involving mucus membranes or skin necrosis: No Has patient had a PCN reaction that required hospitalization: No Has patient had a PCN reaction occurring  within the last 10 years: No If all of the above answers are "NO", then may proceed with Cephalosporin use. Patient has tolerated ceftriaxone in the recent past.   Sulfamethoxazole-trimethoprim Nausea And Vomiting   REACTION: stomach problems   Sulfonamide Derivatives Nausea And Vomiting   REACTION: stomach problems      Medication List    STOP taking these medications   beclomethasone 80 MCG/ACT inhaler Commonly known as:  QVAR   Oxycodone HCl 10 MG Tabs     TAKE these medications   Albuterol Sulfate 108 (90 Base) MCG/ACT Aepb Inhale 2 puffs into the lungs as needed (every four to six hours for cough or wheeze).   aspirin EC 81 MG tablet Take 81 mg by mouth 2 (two) times a week.   B-12 2500 MCG Tabs Take 2,500 mcg by mouth at bedtime.   cephALEXin 500 MG capsule Commonly known as:  KEFLEX Take 1 capsule (500 mg total) by mouth 3 (three) times daily for 5 days.   cholecalciferol 1000 units tablet Commonly known as:  VITAMIN D Take 1,000 Units by mouth at bedtime.   DULoxetine 60 MG capsule Commonly known as:  CYMBALTA Take 60 mg by mouth at bedtime.   ezetimibe 10 MG tablet Commonly known as:  ZETIA Take 10 mg by mouth at bedtime.   Fish Oil 1200 MG Caps Take 1,200 mg by mouth at bedtime.   fluticasone furoate-vilanterol 200-25 MCG/INH Aepb Commonly known as:  BREO ELLIPTA Inhale 1 puff into the lungs daily.   gabapentin 100 MG capsule Commonly known as:  NEURONTIN Take 300 mg by mouth at bedtime.   loratadine 10 MG tablet Commonly known as:  CLARITIN Take 10 mg by mouth at bedtime.   metFORMIN 500 MG tablet Commonly known as:  GLUCOPHAGE Take 1,000 mg by mouth at bedtime.   montelukast 10 MG tablet Commonly known as:  SINGULAIR TAKE ONE TABLET BY MOUTH ONCE DAILY   morphine 30 MG tablet Commonly known as:  MSIR Take 30 mg by mouth daily as needed for severe pain.   multivitamin with minerals Tabs tablet Take 1 tablet by mouth at bedtime.    ondansetron 4 MG tablet Commonly known as:  ZOFRAN Take 1 tablet (4 mg total) by mouth every 6 (six) hours as needed for nausea.   pantoprazole 40 MG tablet Commonly known as:  PROTONIX Take one tablet once every morning What changed:    how much to take  how to take this  when to take this  additional instructions   predniSONE 5 MG tablet Commonly known as:  DELTASONE Take 5 mg by mouth at bedtime. For rheumatoid arthritis   ranitidine 150 MG tablet Commonly known as:  ZANTAC TAKE TWO TABLETS EVERY EVENING What changed:    how much to take  how to take this  when to take this  additional instructions      Follow-up Information    [name], MD. Schedule an appointment as soon as possible for a visit  in 1 week(s).   Specialty:  Internal Medicine Contact information: [place] [address] [phone]        Adrian Prows, MD. Schedule an appointment as soon as possible for a visit in 1 week(s).   Specialty:  Cardiology Why:  for palpitations Contact information: [address] [place] [address] [phone]          Allergies  Allergen Reactions  . Penicillins Hives    Has patient had a PCN reaction causing immediate rash, facial/tongue/throat swelling, SOB or lightheadedness with hypotension: Yes Has patient had a PCN reaction causing severe rash involving mucus membranes or skin necrosis: No Has patient had a PCN reaction that required hospitalization: No Has patient had a PCN reaction occurring within the last 10 years: No If all of the above answers are "NO", then may proceed with Cephalosporin use. Patient has tolerated ceftriaxone in the recent past.  . Sulfamethoxazole-Trimethoprim Nausea And Vomiting    REACTION: stomach problems  . Sulfonamide Derivatives Nausea And Vomiting    REACTION: stomach problems    Consultations:  None   Procedures/Studies: Dg Chest 2 View  Result Date: 01/17/2018 CLINICAL DATA:   Fever and heart palpitations. EXAM: CHEST - 2 VIEW COMPARISON:  03/09/2017 FINDINGS: Mild cardiomegaly. No focal consolidation or edema. No pleural effusion or pneumothorax. IMPRESSION: No active cardiopulmonary disease. Electronically Signed   By: Ulyses Jarred M.D.   On: 01/17/2018 17:30   Ct Angio Chest Pe W Or Wo Contrast  Result Date: 01/18/2018 CLINICAL DATA:  Acute onset of chills and palpitations. Fever and tachycardia. Intermittent hypotension. Recently diagnosed with urinary tract infection. EXAM: CT ANGIOGRAPHY CHEST CT ABDOMEN AND PELVIS WITH CONTRAST TECHNIQUE: Multidetector CT imaging of the chest was performed using the standard protocol during bolus administration of intravenous contrast. Multiplanar CT image reconstructions and MIPs were obtained to evaluate the vascular anatomy. Multidetector CT imaging of the abdomen and pelvis was performed using the standard protocol during bolus administration of intravenous contrast. CONTRAST:  179mL ISOVUE-370 IOPAMIDOL (ISOVUE-370) INJECTION 76% COMPARISON:  CT of the abdomen and pelvis performed 10/25/2005, and chest radiograph performed 01/17/2018 FINDINGS: CTA CHEST FINDINGS Cardiovascular: There is no evidence of significant pulmonary embolus. Scattered coronary artery calcifications are seen. The heart is normal in size. Scattered calcification is noted along the thoracic aorta and proximal great vessels. Mediastinum/Nodes: Visualized mediastinal nodes remain borderline normal in size. No pericardial effusion is identified. The visualized portions of the thyroid gland are unremarkable. No axillary lymphadenopathy is seen. Lungs/Pleura: Mild bilateral atelectasis is noted. 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 No significant pleural effusion or pneumothorax is identified. No masses are seen. Musculoskeletal: No acute osseous abnormalities are identified. The visualized musculature is unremarkable in appearance. Review of the MIP images confirms the  above findings. CT ABDOMEN and PELVIS FINDINGS Hepatobiliary: The liver is unremarkable in appearance. The gallbladder is unremarkable in appearance. The common bile duct remains normal in caliber. Pancreas: The pancreas is within normal limits. Spleen: Cysts are noted within the spleen, measuring up to 5.6 cm in size. Adrenals/Urinary Tract: The adrenal glands are unremarkable in appearance. Nonspecific perinephric stranding is noted bilaterally. A few tiny bilateral renal cysts are seen. There is no evidence of hydronephrosis. No renal or ureteral stones are identified. Stomach/Bowel: The stomach is unremarkable in appearance. The small bowel is within normal limits. The appendix is not visualized; there is no evidence for appendicitis. Mild diverticulosis is noted along the sigmoid colon, without evidence of diverticulitis. Vascular/Lymphatic: Diffuse  calcification is seen along the abdominal aorta and its branches. The abdominal aorta is otherwise grossly unremarkable. The inferior vena cava is grossly unremarkable. No retroperitoneal lymphadenopathy is seen. No pelvic sidewall lymphadenopathy is identified. Reproductive: The bladder is mildly distended and grossly unremarkable. The patient is status post hysterectomy. No suspicious adnexal masses are seen. The ovaries are relatively symmetric. Other: No additional soft tissue abnormalities are seen. Musculoskeletal: No acute osseous abnormalities are identified. Endplate sclerotic change and vacuum phenomenon are noted along the lumbar spine. The visualized musculature is unremarkable in appearance. Review of the MIP images confirms the above findings. IMPRESSION: 1. No evidence of significant pulmonary embolus. 2. Mild bilateral atelectasis noted. 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 3. Scattered coronary artery calcifications. 4. Cysts within the spleen, measuring up to 5.6 cm in size. Few tiny bilateral renal cysts. 5. Mild diverticulosis along the sigmoid colon,  without evidence of diverticulitis. Aortic Atherosclerosis (ICD10-I70.0). Electronically Signed   By: Garald Balding M.D.   On: 01/18/2018 01:28   Ct Abdomen Pelvis W Contrast  Result Date: 01/18/2018 CLINICAL DATA:  Acute onset of chills and palpitations. Fever and tachycardia. Intermittent hypotension. Recently diagnosed with urinary tract infection. EXAM: CT ANGIOGRAPHY CHEST CT ABDOMEN AND PELVIS WITH CONTRAST TECHNIQUE: Multidetector CT imaging of the chest was performed using the standard protocol during bolus administration of intravenous contrast. Multiplanar CT image reconstructions and MIPs were obtained to evaluate the vascular anatomy. Multidetector CT imaging of the abdomen and pelvis was performed using the standard protocol during bolus administration of intravenous contrast. CONTRAST:  170mL ISOVUE-370 IOPAMIDOL (ISOVUE-370) INJECTION 76% COMPARISON:  CT of the abdomen and pelvis performed 10/25/2005, and chest radiograph performed 01/17/2018 FINDINGS: CTA CHEST FINDINGS Cardiovascular: There is no evidence of significant pulmonary embolus. Scattered coronary artery calcifications are seen. The heart is normal in size. Scattered calcification is noted along the thoracic aorta and proximal great vessels. Mediastinum/Nodes: Visualized mediastinal nodes remain borderline normal in size. No pericardial effusion is identified. The visualized portions of the thyroid gland are unremarkable. No axillary lymphadenopathy is seen. Lungs/Pleura: Mild bilateral atelectasis is noted. 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 No significant pleural effusion or pneumothorax is identified. No masses are seen. Musculoskeletal: No acute osseous abnormalities are identified. The visualized musculature is unremarkable in appearance. Review of the MIP images confirms the above findings. CT ABDOMEN and PELVIS FINDINGS Hepatobiliary: The liver is unremarkable in appearance. The gallbladder is unremarkable in appearance. The  common bile duct remains normal in caliber. Pancreas: The pancreas is within normal limits. Spleen: Cysts are noted within the spleen, measuring up to 5.6 cm in size. Adrenals/Urinary Tract: The adrenal glands are unremarkable in appearance. Nonspecific perinephric stranding is noted bilaterally. A few tiny bilateral renal cysts are seen. There is no evidence of hydronephrosis. No renal or ureteral stones are identified. Stomach/Bowel: The stomach is unremarkable in appearance. The small bowel is within normal limits. The appendix is not visualized; there is no evidence for appendicitis. Mild diverticulosis is noted along the sigmoid colon, without evidence of diverticulitis. Vascular/Lymphatic: Diffuse calcification is seen along the abdominal aorta and its branches. The abdominal aorta is otherwise grossly unremarkable. The inferior vena cava is grossly unremarkable. No retroperitoneal lymphadenopathy is seen. No pelvic sidewall lymphadenopathy is identified. Reproductive: The bladder is mildly distended and grossly unremarkable. The patient is status post hysterectomy. No suspicious adnexal masses are seen. The ovaries are relatively symmetric. Other: No additional soft tissue abnormalities are seen. Musculoskeletal: No acute osseous abnormalities are identified. Endplate sclerotic change and  vacuum phenomenon are noted along the lumbar spine. The visualized musculature is unremarkable in appearance. Review of the MIP images confirms the above findings. IMPRESSION: 1. No evidence of significant pulmonary embolus. 2. Mild bilateral atelectasis noted. 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 3. Scattered coronary artery calcifications. 4. Cysts within the spleen, measuring up to 5.6 cm in size. Few tiny bilateral renal cysts. 5. Mild diverticulosis along the sigmoid colon, without evidence of diverticulitis. Aortic Atherosclerosis (ICD10-I70.0). Electronically Signed   By: Garald Balding M.D.   On: 01/18/2018 01:28     Echo Study Conclusions  - Left ventricle: The cavity size was normal. Wall thickness was   increased in a pattern of mild LVH. Systolic function was normal.   The estimated ejection fraction was in the range of 60% to 65%.   Wall motion was normal; there were no regional wall motion   abnormalities. Doppler parameters are consistent with abnormal   left ventricular relaxation (grade 1 diastolic dysfunction). - Aortic valve: Trileaflet; mildly thickened leaflets. Sclerosis   without stenosis. - Mitral valve: Mildly calcified annulus. Normal thickness leaflets   . There was mild regurgitation. - Tricuspid valve: There was mild regurgitation   Subjective: Patient seen and examined at bedside.  She feels much better.  No overnight fever, nausea or vomiting.  No palpitations.  Wants to go home.  Discharge Exam: Vitals:   01/18/18 2041 01/19/18 0532  BP: (!) 133/52 (!) 117/57  Pulse: 99 81  Resp: 18 18  Temp: 98.2 F (36.8 C) (!) 97.3 F (36.3 C)  SpO2: 97% 96%   Vitals:   01/18/18 1423 01/18/18 1604 01/18/18 2041 01/19/18 0532  BP: (!) 105/51 120/63 (!) 133/52 (!) 117/57  Pulse: 94 (!) 105 99 81  Resp: 20 20 18 18   Temp: 98.6 F (37 C) 98.4 F (36.9 C) 98.2 F (36.8 C) (!) 97.3 F (36.3 C)  TempSrc: Oral Oral Oral Oral  SpO2: 96% 96% 97% 96%  Weight:    84.8 kg  Height:        General: Pt is alert, awake, not in acute distress Cardiovascular: rate controlled, S1/S2 + Respiratory: bilateral decreased breath sounds at bases Abdominal: Soft, NT, ND, bowel sounds + Extremities:No cyanosis, clubbing, edema.  Bilateral knees with healed scar.  No erythema or warmth.  Bilateral fingers with rheumatoid deformities    The results of significant diagnostics from this hospitalization (including imaging, microbiology, ancillary and laboratory) are listed below for reference.     Microbiology: Recent Results (from the past 240 hour(s))  Blood Culture (routine x 2)      Status: None (Preliminary result)   Collection Time: 01/17/18  4:02 PM  Result Value Ref Range Status   Specimen Description BLOOD BLOOD RIGHT HAND  Final   Special Requests   Final    BOTTLES DRAWN AEROBIC ONLY Blood Culture results may not be optimal due to an inadequate volume of blood received in culture bottles   Culture   Final    NO GROWTH 2 DAYS Performed at [facility], [place] [address] [place], [place] [address]    Report Status PENDING  Incomplete  Urine culture     Status: None   Collection Time: 01/17/18  4:02 PM  Result Value Ref Range Status   Specimen Description URINE, RANDOM  Final   Special Requests   Final    NONE Performed at [facility], [place] [address], [place], [place] [address]    Culture  Final    Multiple bacterial morphotypes present, none predominant. Suggest appropriate recollection if clinically indicated.   Report Status 01/19/2018 FINAL  Final  Blood Culture (routine x 2)     Status: None (Preliminary result)   Collection Time: 01/17/18  4:30 PM  Result Value Ref Range Status   Specimen Description BLOOD LEFT ANTECUBITAL  Final   Special Requests   Final    BOTTLES DRAWN AEROBIC AND ANAEROBIC Blood Culture adequate volume   Culture   Final    NO GROWTH 2 DAYS Performed at [facility], [place] [address], [place], [place] [address]    Report Status PENDING  Incomplete     Labs: BNP (last 3 results) No results for input(s): BNP in the last 8760 hours. Basic Metabolic Panel: Recent Labs  Lab 01/17/18 1630 01/18/18 1020 01/19/18 0821  NA 133* 138 140  K 4.5 4.1 4.1  CL 103 113* 114*  CO2 20* 16* 19*  GLUCOSE 116* 147* 181*  BUN 14 10 11   CREATININE 1.05* 0.84 0.87  CALCIUM 8.8* 8.0* 8.3*  MG  --  1.6* 1.7  PHOS  --  3.3  --    Liver Function Tests: Recent Labs  Lab 01/17/18 1630 01/18/18 1020 01/19/18 0821  AST 17 17 15   ALT 13 13 12   ALKPHOS 76 58 75  BILITOT 1.1 0.3 0.6  PROT 5.7* 5.2* 5.7*  ALBUMIN 2.7*  2.3* 2.6*   No results for input(s): LIPASE, AMYLASE in the last 168 hours. No results for input(s): AMMONIA in the last 168 hours. CBC: Recent Labs  Lab 01/17/18 1630 01/18/18 1020 01/19/18 0821  WBC 8.8 5.9 8.8  NEUTROABS 6.1  --  7.0  HGB 10.3* 8.7* 9.0*  HCT 35.4* 30.0* 31.6*  MCV 80.5 81.3 82.1  PLT 334 317 322   Cardiac Enzymes: Recent Labs  Lab 01/17/18 2137 01/18/18 0348 01/18/18 1020  TROPONINI <0.03 <0.03 <0.03   BNP: Invalid input(s): POCBNP CBG: Recent Labs  Lab 01/18/18 1148 01/18/18 1604 01/18/18 2044 01/19/18 0751  GLUCAP 106* 165* 268* 152*   D-Dimer Recent Labs    01/17/18 2137  DDIMER 2.67*   Hgb A1c No results for input(s): HGBA1C in the last 72 hours. Lipid Profile No results for input(s): CHOL, HDL, LDLCALC, TRIG, CHOLHDL, LDLDIRECT in the last 72 hours. Thyroid function studies Recent Labs    01/18/18 1020  TSH 0.892   Anemia work up No results for input(s): VITAMINB12, FOLATE, FERRITIN, TIBC, IRON, RETICCTPCT in the last 72 hours. Urinalysis    Component Value Date/Time   COLORURINE YELLOW 01/17/2018 1900   APPEARANCEUR HAZY (A) 01/17/2018 1900   LABSPEC 1.012 01/17/2018 1900   PHURINE 5.0 01/17/2018 1900   GLUCOSEU NEGATIVE 01/17/2018 1900   GLUCOSEU NEGATIVE 12/24/2016 1239   HGBUR NEGATIVE 01/17/2018 1900   BILIRUBINUR NEGATIVE 01/17/2018 1900   KETONESUR NEGATIVE 01/17/2018 1900   PROTEINUR NEGATIVE 01/17/2018 1900   UROBILINOGEN 0.2 12/24/2016 1239   NITRITE NEGATIVE 01/17/2018 1900   LEUKOCYTESUR LARGE (A) 01/17/2018 1900   Sepsis Labs Invalid input(s): PROCALCITONIN,  WBC,  LACTICIDVEN Microbiology Recent Results (from the past 240 hour(s))  Blood Culture (routine x 2)     Status: None (Preliminary result)   Collection Time: 01/17/18  4:02 PM  Result Value Ref Range Status   Specimen Description BLOOD BLOOD RIGHT HAND  Final   Special Requests   Final    BOTTLES DRAWN AEROBIC ONLY Blood Culture results may  not be  optimal due to an inadequate volume of blood received in culture bottles   Culture   Final    NO GROWTH 2 DAYS Performed at [facility], [place] [address], [place], [place] [address]    Report Status PENDING  Incomplete  Urine culture     Status: None   Collection Time: 01/17/18  4:02 PM  Result Value Ref Range Status   Specimen Description URINE, RANDOM  Final   Special Requests   Final    NONE Performed at [facility], [place] [address], [place], [place] [address]    Culture   Final    Multiple bacterial morphotypes present, none predominant. Suggest appropriate recollection if clinically indicated.   Report Status 01/19/2018 FINAL  Final  Blood Culture (routine x 2)     Status: None (Preliminary result)   Collection Time: 01/17/18  4:30 PM  Result Value Ref Range Status   Specimen Description BLOOD LEFT ANTECUBITAL  Final   Special Requests   Final    BOTTLES DRAWN AEROBIC AND ANAEROBIC Blood Culture adequate volume   Culture   Final    NO GROWTH 2 DAYS Performed at [facility], [place] [address] [place], [place] [address]    Report Status PENDING  Incomplete     Time coordinating discharge: 35 minutes  SIGNED:   Aline August, MD  [facility] 01/19/2018, 10:01 AM Pager: [phone]  If 7PM-7AM, please contact night-coverage [email or web address] Password [email or web address]

## 2018-01-19 NOTE — Progress Notes (Signed)
Inpatient Diabetes Program Recommendations  AACE/ADA: New Consensus Statement on Inpatient Glycemic Control (2015)  Target Ranges:  Prepandial:   less than 140 mg/dL      Peak postprandial:   less than 180 mg/dL (1-2 hours)      Critically ill patients:  140 - 180 mg/dL   Results for JAMIYA, NIMS (MRN [id number]) as of 01/19/2018 09:51  Ref. Range 01/18/2018 11:48 01/18/2018 16:04 01/18/2018 20:44 01/19/2018 07:51  Glucose-Capillary Latest Ref Range: 70 - 99 mg/dL 106 (H) 165 (H) 268 (H) 152 (H)   Review of Glycemic Control  Diabetes history: DM 2 Outpatient Diabetes medications: Metformin 1000 mg qday at supper Current orders for Inpatient glycemic control: Novolog 0-9 units tid, Novolog 0-5 units qhs  Inpatient Diabetes Program Recommendations:   Patient on Solucortef 25 mg Q12 hours.  Patient currently ordered a regular diet. Consider changing diet to carb modified.  Thanks,  Tama Headings RN, MSN, BC-ADM Inpatient Diabetes Coordinator Team Pager [phone] (8a-5p)

## 2018-01-19 NOTE — Progress Notes (Signed)
Orders for [facility] faxed to [facility] as requested for HHPTAneta Mins [phone]

## 2018-01-19 NOTE — Plan of Care (Signed)

## 2018-01-19 NOTE — Clinical Social Work Note (Signed)
CSW acknowledges consult for equipment. RNCM has already discussed home health needs with patient.  CSW signing off. Consult again if any social work needs arise.  Dayton Scrape, [place]

## 2018-01-19 NOTE — Telephone Encounter (Signed)
Called patient advised we would not be able to send in any medications until being seen due to [facility] [employer] of pharmaceuticals patients need to be seen every yr and she has not been seen in 19 months per [name], Engineer, building services . Patient hung up the phone before an appt could be offer.

## 2018-01-19 NOTE — Evaluation (Signed)
Occupational Therapy Evaluation Patient Details Name: [name] MRN: [id number] DOB: 1940-03-22 Today's Date: 01/19/2018    History of Present Illness Pt is a 77 y.o. F with significant PMH of diabetes, rheumatoid arthritis, fibromyalgia, emphysema, asthma, OSA not on CPAP, bilateral TKA with prosthetic joint infection with multiple surgeries to her right knee, most recent in May 2019 with right knee spacer removal and total knee revision with extensor mechanism repair at [facility]. Presents with SIRS and fever and hypotension.   Clinical Impression   PTA, pt was living alone and was performing ADLs and IADLs (including driving) with adaptive equipment and compensatory techniques. Pt using a RW and w/c for functional mobility. Pt currently requiring Supervision-Min Guard A for ADLs and functional mobility with exception of toilet hygiene requiring Mod A. Pt would benefit from further acute OT to facilitate safe dc. Recommend dc to home once medically stable per physician.  Of note: pt reporting that her PCP has faxed an order for a power wheelchair to [facility] (~6weeks ago). She was wanting to get order filled while she was in town and at [facility].     Follow Up Recommendations  No OT follow up;Supervision - Intermittent    Equipment Recommendations  None recommended by OT    Recommendations for Other Services       Precautions / Restrictions Precautions Precautions: Fall Restrictions Weight Bearing Restrictions: No      Mobility Bed Mobility               General bed mobility comments: OOB with NT upon arrival  Transfers Overall transfer level: Needs assistance Equipment used: Rolling walker (2 wheeled) Transfers: Sit to/from Stand Sit to Stand: Supervision         General transfer comment: supervision for safety    Balance Overall balance assessment: Needs assistance Sitting-balance support: No upper extremity supported;Feet supported Sitting balance-Leahy Scale: Good      Standing balance support: Bilateral upper extremity supported Standing balance-Leahy Scale: Poor Standing balance comment: reliant on external support                           ADL either performed or assessed with clinical judgement   ADL Overall ADL's : Needs assistance/impaired Eating/Feeding: Independent;Sitting   Grooming: Oral care;Wash/dry face;Brushing hair;Supervision/safety;Set up;Standing   Upper Body Bathing: Supervision/ safety;Set up;Sitting   Lower Body Bathing: Min guard;Sit to/from stand   Upper Body Dressing : Supervision/safety;Set up;Sitting   Lower Body Dressing: Min guard;Sit to/from stand   Toilet Transfer: Min guard;Ambulation;BSC;RW   Toileting- Clothing Manipulation and Hygiene: Moderate assistance;Sit to/from stand Toileting - Clothing Manipulation Details (indicate cue type and reason): Requiring assistance to perform toilet hygiene after BM. Able to clean front peri care with Min Guard A for safety     Functional mobility during ADLs: Min guard;Rolling walker General ADL Comments: Pt performing ADLs and functional mobility with supervision-Mim Guard; exception of toilet hygiene after BM. Pt reports she has a bidet toilet at home for toilet hygiene.      Vision   Additional Comments: Reporting dry eyes and pt rubbing them throughout session. RN notified and provided eye drops     Perception     Praxis      Pertinent Vitals/Pain Pain Assessment: Faces Faces Pain Scale: Hurts a little bit Pain Location: chronic bilateral shoulder pain Pain Descriptors / Indicators: Aching Pain Intervention(s): Monitored during session;Limited activity within patient's tolerance;Repositioned     Hand Dominance  Right   Extremity/Trunk Assessment Upper Extremity Assessment Upper Extremity Assessment: Generalized weakness RUE Deficits / Details: Pt reporting incrreased edema in hands since admission. Arthritic changes with increased finger/wrist  flexion LUE Deficits / Details: Pt reporting incrreased edema in hands since admission. Arthritic changes with increased finger/wrist flexion   Lower Extremity Assessment Lower Extremity Assessment: Defer to PT evaluation RLE Deficits / Details: Grossly 4/5 LLE Deficits / Details: Strength 5/5    Cervical / Trunk Assessment Cervical / Trunk Assessment: Kyphotic   Communication Communication Communication: No difficulties   Cognition Arousal/Alertness: Awake/alert Behavior During Therapy: WFL for tasks assessed/performed Overall Cognitive Status: Within Functional Limits for tasks assessed                                     General Comments  Pt with questions about an order for a power w/c. Pt reports that her PCP has faxed over an order for a power w/c to [facility] and she has been waiting 6 weeks. No one has called or contacted her PCP for completing order of power w/c. RN notified and will contact CM    Exercises Exercises: Other exercises Other Exercises Other Exercises: Educating pt on ROM for bilateral hands to manage edema.   Shoulder Instructions      Home Living Family/patient expects to be discharged to:: Private residence Living Arrangements: Alone Available Help at Discharge: Available PRN/intermittently Type of Home: Mobile home Home Access: Ramped entrance     [place]: One level     Bathroom Shower/Tub: Hospital doctor Toilet: Handicapped height     Home Equipment: Environmental consultant - 2 wheels;Wheelchair - manual          Prior Functioning/Environment Level of Independence: Independent with assistive device(s)        Comments: Reports using wheelchair mainly for household and community mobility (secondary to RA) although is ambulatory using walker. Drives and independent with ADL's.        OT Problem List: Decreased strength;Decreased range of motion;Decreased activity tolerance;Impaired balance (sitting and/or standing);Decreased  knowledge of use of DME or AE;Decreased knowledge of precautions;Impaired UE functional use;Increased edema      OT Treatment/Interventions: Self-care/ADL training;Therapeutic exercise;Energy conservation;DME and/or AE instruction;Therapeutic activities;Patient/family education    OT Goals(Current goals can be found in the care plan section) Acute Rehab OT Goals Patient Stated Goal: "my daughter wants me to go to an assisted living." OT Goal Formulation: With patient Time For Goal Achievement: 02/02/18 Potential to Achieve Goals: Good  OT Frequency: Min 2X/week   Barriers to D/C:            Co-evaluation              AM-PAC PT "6 Clicks" Daily Activity     Outcome Measure Help from another person eating meals?: None Help from another person taking care of personal grooming?: A Little Help from another person toileting, which includes using toliet, bedpan, or urinal?: A Lot Help from another person bathing (including washing, rinsing, drying)?: A Little Help from another person to put on and taking off regular upper body clothing?: A Little Help from another person to put on and taking off regular lower body clothing?: A Little 6 Click Score: 18   End of Session Equipment Utilized During Treatment: Rolling walker Nurse Communication: Mobility status  Activity Tolerance: Patient tolerated treatment well Patient left: in chair;with call bell/phone within reach;with  nursing/sitter in room  OT Visit Diagnosis: Unsteadiness on feet (R26.81);Other abnormalities of gait and mobility (R26.89);Muscle weakness (generalized) (M62.81)                Time: 5750-5183 OT Time Calculation (min): 16 min Charges:  OT General Charges $OT Visit: 1 Visit OT Evaluation $OT Eval Low Complexity: 1 Low  Jocelin Schuelke MSOT, OTR/L Acute Rehab Pager: [phone] Office: [place] 01/19/2018, 8:30 AM

## 2018-01-19 NOTE — Telephone Encounter (Signed)
Dr. Neldon Mc will you please advise? The patient has not been seen since 05/2016

## 2018-01-20 ENCOUNTER — Ambulatory Visit: Payer: Self-pay | Admitting: Allergy

## 2018-01-21 DIAGNOSIS — N390 Urinary tract infection, site not specified: Secondary | ICD-10-CM

## 2018-01-21 DIAGNOSIS — E088 Diabetes mellitus due to underlying condition with unspecified complications: Secondary | ICD-10-CM

## 2018-01-22 LAB — CULTURE, BLOOD (ROUTINE X 2)
Culture: NO GROWTH
Culture: NO GROWTH
Special Requests: ADEQUATE

## 2018-01-23 DIAGNOSIS — M13811 Other specified arthritis, right shoulder: Secondary | ICD-10-CM | POA: Diagnosis not present

## 2018-02-12 ENCOUNTER — Ambulatory Visit: Payer: Medicare Other | Admitting: Physical Therapy

## 2018-02-17 DIAGNOSIS — M0579 Rheumatoid arthritis with rheumatoid factor of multiple sites without organ or systems involvement: Secondary | ICD-10-CM | POA: Diagnosis not present

## 2018-02-23 DIAGNOSIS — G8929 Other chronic pain: Secondary | ICD-10-CM | POA: Diagnosis not present

## 2018-02-23 DIAGNOSIS — Z96651 Presence of right artificial knee joint: Secondary | ICD-10-CM | POA: Diagnosis not present

## 2018-02-23 DIAGNOSIS — M25561 Pain in right knee: Secondary | ICD-10-CM | POA: Diagnosis not present

## 2018-02-23 DIAGNOSIS — Z471 Aftercare following joint replacement surgery: Secondary | ICD-10-CM | POA: Diagnosis not present

## 2018-02-26 DIAGNOSIS — Z6827 Body mass index (BMI) 27.0-27.9, adult: Secondary | ICD-10-CM | POA: Diagnosis not present

## 2018-02-26 DIAGNOSIS — K219 Gastro-esophageal reflux disease without esophagitis: Secondary | ICD-10-CM | POA: Diagnosis not present

## 2018-02-26 DIAGNOSIS — M810 Age-related osteoporosis without current pathological fracture: Secondary | ICD-10-CM | POA: Diagnosis not present

## 2018-02-26 DIAGNOSIS — R82998 Other abnormal findings in urine: Secondary | ICD-10-CM | POA: Diagnosis not present

## 2018-02-26 DIAGNOSIS — N390 Urinary tract infection, site not specified: Secondary | ICD-10-CM | POA: Diagnosis not present

## 2018-02-26 DIAGNOSIS — E1151 Type 2 diabetes mellitus with diabetic peripheral angiopathy without gangrene: Secondary | ICD-10-CM | POA: Diagnosis not present

## 2018-02-26 DIAGNOSIS — M069 Rheumatoid arthritis, unspecified: Secondary | ICD-10-CM | POA: Diagnosis not present

## 2018-02-26 DIAGNOSIS — N183 Chronic kidney disease, stage 3 (moderate): Secondary | ICD-10-CM | POA: Diagnosis not present

## 2018-02-26 DIAGNOSIS — R808 Other proteinuria: Secondary | ICD-10-CM | POA: Diagnosis not present

## 2018-02-26 DIAGNOSIS — R2689 Other abnormalities of gait and mobility: Secondary | ICD-10-CM | POA: Diagnosis not present

## 2018-02-26 DIAGNOSIS — R413 Other amnesia: Secondary | ICD-10-CM | POA: Diagnosis not present

## 2018-03-24 ENCOUNTER — Ambulatory Visit: Payer: Medicare Other | Admitting: Podiatry

## 2018-04-10 ENCOUNTER — Encounter: Payer: Self-pay | Admitting: Sports Medicine

## 2018-04-10 ENCOUNTER — Ambulatory Visit (INDEPENDENT_AMBULATORY_CARE_PROVIDER_SITE_OTHER): Payer: Medicare Other | Admitting: Sports Medicine

## 2018-04-10 VITALS — BP 141/91 | HR 99 | Resp 16

## 2018-04-10 DIAGNOSIS — B351 Tinea unguium: Secondary | ICD-10-CM

## 2018-04-10 DIAGNOSIS — M069 Rheumatoid arthritis, unspecified: Secondary | ICD-10-CM

## 2018-04-10 DIAGNOSIS — M79676 Pain in unspecified toe(s): Secondary | ICD-10-CM | POA: Diagnosis not present

## 2018-04-10 NOTE — Progress Notes (Signed)
Subjective: [name] is a 78 y.o. female patient with history of diabetes who returns to office today complaining of callus pain and long, painful nails; unable to trim. Patient states that the glucose was not recorded, DONT CHECK, last PCP visit was 1 month ago. Patient is using a walker and reports that it has been a while since she has been to have her nails trimmed since she was recovering after having surgery.  Patient denies any medical issues or any major changes with medications.  Patient denies any other issues at this time.   Patient Active Problem List   Diagnosis Date Noted  . Diabetes mellitus due to underlying condition, controlled, with complication, without long-term current use of insulin ([place])   . Lower urinary tract infectious disease   . Palpitations 01/17/2018  . Dehydration 01/17/2018  . Hypotension 01/17/2018  . Symptom of blood in stool 01/17/2018  . Diarrhea in adult patient 01/17/2018  . Arthritis, septic, knee ([place]) 03/09/2017  . Septic prepatellar bursitis of right knee 10/26/2014  . Prepatellar bursitis of right knee 08/28/2014  . DYSPHAGIA ORAL PHASE 06/18/2007  . Asthma 05/14/2007  . SLEEP APNEA 05/14/2007  . GERD 05/13/2007  . Rheumatoid arthritis ([place]) 05/13/2007  . OSTEOPOROSIS 05/13/2007  . DIARRHEA, CHRONIC 05/13/2007   Current Outpatient Medications on File Prior to Visit  Medication Sig Dispense Refill  . Albuterol Sulfate (PROAIR RESPICLICK) 498 (90 Base) MCG/ACT AEPB Inhale 2 puffs into the lungs as needed (every four to six hours for cough or wheeze). 1 each 1  . ALPRAZolam (XANAX) 0.5 MG tablet     . aspirin EC 81 MG tablet Take 81 mg by mouth 2 (two) times a week.     Marland Kitchen atorvastatin (LIPITOR) 40 MG tablet     . cholecalciferol (VITAMIN D) 1000 UNITS tablet Take 1,000 Units by mouth at bedtime.     . Cyanocobalamin (B-12) 2500 MCG TABS Take 2,500 mcg by mouth at bedtime.     . DULoxetine (CYMBALTA) 60 MG capsule Take 60 mg by mouth at  bedtime.     Marland Kitchen ezetimibe (ZETIA) 10 MG tablet Take 10 mg by mouth at bedtime.     . fluticasone furoate-vilanterol (BREO ELLIPTA) 200-25 MCG/INH AEPB Inhale 1 puff into the lungs daily. 60 each 5  . gabapentin (NEURONTIN) 100 MG capsule Take 300 mg by mouth at bedtime.     . gabapentin (NEURONTIN) 300 MG capsule     . loratadine (CLARITIN) 10 MG tablet Take 10 mg by mouth at bedtime.     . metFORMIN (GLUCOPHAGE) 500 MG tablet Take 1,000 mg by mouth at bedtime.     . montelukast (SINGULAIR) 10 MG tablet TAKE ONE TABLET BY MOUTH ONCE DAILY 30 tablet 0  . morphine (MSIR) 30 MG tablet Take 30 mg by mouth daily as needed for severe pain.    . Multiple Vitamin (MULTIVITAMIN WITH MINERALS) TABS tablet Take 1 tablet by mouth at bedtime.    . Omega-3 Fatty Acids (FISH OIL) 1200 MG CAPS Take 1,200 mg by mouth at bedtime.     . ondansetron (ZOFRAN) 4 MG tablet Take 1 tablet (4 mg total) by mouth every 6 (six) hours as needed for nausea. 20 tablet 0  . pantoprazole (PROTONIX) 40 MG tablet Take one tablet once every morning (Patient taking differently: Take 40 mg by mouth at bedtime. ) 30 tablet 5  . predniSONE (DELTASONE) 5 MG tablet Take 5 mg by mouth at bedtime. For rheumatoid  arthritis    . ranitidine (ZANTAC) 150 MG tablet TAKE TWO TABLETS EVERY EVENING (Patient taking differently: Take 300 mg by mouth at bedtime. ) 180 tablet 1  . vancomycin (VANCOCIN) 125 MG capsule vancomycin 125 mg capsule     No current facility-administered medications on file prior to visit.    Allergies  Allergen Reactions  . Penicillins Hives    Has patient had a PCN reaction causing immediate rash, facial/tongue/throat swelling, SOB or lightheadedness with hypotension: Yes Has patient had a PCN reaction causing severe rash involving mucus membranes or skin necrosis: No Has patient had a PCN reaction that required hospitalization: No Has patient had a PCN reaction occurring within the last 10 years: No If all of the above  answers are "NO", then may proceed with Cephalosporin use. Patient has tolerated ceftriaxone in the recent past.  . Sulfamethoxazole-Trimethoprim Nausea And Vomiting    REACTION: stomach problems  . Sulfonamide Derivatives Nausea And Vomiting    REACTION: stomach problems    Objective: General: Patient is awake, alert, and oriented x 3 and in no acute distress in wheelchair   Integument: Skin is warm, dry and supple bilateral. Nails are tender, long, thickened and  dystrophic with subungual debris, consistent with onychomycosis, 1-5 bilateral. No signs of infection.  Minimal callus bilateral, Remaining integument unremarkable.  Vasculature:  Dorsalis Pedis pulse 1/4 bilateral. Posterior Tibial pulse  0/4 bilateral.  Capillary fill time <3 sec 1-5 bilateral. No hair growth to the level of the digits. Temperature gradient within normal limits. + varicosities present bilateral. Trace edema present bilateral.   Neurology: The patient has diminished sensation measured with a 5.07/10g Semmes Weinstein Monofilament at all pedal sites bilateral . Vibratory sensation diminished bilateral with tuning fork. No Babinski sign present bilateral.   Musculoskeletal: Severely dislocated lesser metatarsophalangeal joints and digital deformities secondary to rheumatoid arthritis, there is significant pes planus deformity with plantar bony prominences specifically at the navicular of the left foot. Muscular strength 4/5 in all lower extremity muscular groups bilateral without pain on range of motion . No tenderness with calf compression bilateral.  Assessment and Plan: Problem List Items Addressed This Visit      Musculoskeletal and Integument   Rheumatoid arthritis ([place])    Other Visit Diagnoses    Dermatophytosis of nail    -  Primary   Relevant Medications   vancomycin (VANCOCIN) 125 MG capsule   Pain of toe, unspecified laterality         -Examined patient. -Discussed and educated patient on  diabetic foot care, especially with  regards to the vascular, neurological and musculoskeletal systems.  -Mechanically debrided all nails 1-5 bilateral using sterile nail nipper and filed with dremel without incident. -Answered all patient questions -Patient to return  in 2.5 months for at risk foot care -Patient advised to call the office if any problems or questions arise in the meantime.  Landis Martins, DPM

## 2018-05-07 ENCOUNTER — Other Ambulatory Visit: Payer: Self-pay

## 2018-05-07 ENCOUNTER — Encounter: Payer: Self-pay | Admitting: Family

## 2018-05-07 ENCOUNTER — Ambulatory Visit (INDEPENDENT_AMBULATORY_CARE_PROVIDER_SITE_OTHER): Payer: Medicare Other | Admitting: Physician Assistant

## 2018-05-07 DIAGNOSIS — M79645 Pain in left finger(s): Secondary | ICD-10-CM | POA: Diagnosis not present

## 2018-05-07 DIAGNOSIS — Z8249 Family history of ischemic heart disease and other diseases of the circulatory system: Secondary | ICD-10-CM | POA: Diagnosis not present

## 2018-05-07 DIAGNOSIS — M069 Rheumatoid arthritis, unspecified: Secondary | ICD-10-CM | POA: Diagnosis not present

## 2018-05-07 DIAGNOSIS — M79646 Pain in unspecified finger(s): Secondary | ICD-10-CM | POA: Insufficient documentation

## 2018-05-07 DIAGNOSIS — Z6828 Body mass index (BMI) 28.0-28.9, adult: Secondary | ICD-10-CM | POA: Diagnosis not present

## 2018-05-07 NOTE — Progress Notes (Signed)
Requested by: [name], [address] [place], [place] [address]  Reason for consultation: discoloration left index finger   History of Present Illness   [name] is a 78 y.o. (January 14, 1941) female who presents with chief complaint: discoloration and pain of left index finger over the past 10 days.  Past medical history significant for diabetes mellitus, rheumatoid arthritis, emphysema, sleep apnea who was evaluated by her primary care physician this afternoon and urgently referred to [facility] office.  She sees a specialist at [facility] for advanced rheumatoid arthritis.  She does not recall any significant injury to her left index finger however she has noticed a progression and discoloration and pain over the past 10 days.  She denies any history of arrhythmias, abnormal heart anatomy, or aneurysmal disease.  She does state she has family history of aneurysmal disease.  She is taking an aspirin 3 times a week.  She is taking a statin daily.  She denies tobacco use.  She denies any tissue changes of bilateral lower extremities.  Past Medical History:  Diagnosis Date  . Asthma   . Complication of anesthesia    Anesthesia told her years ago she got too cold during surgery  . Diabetes mellitus without complication ([place])   . Emphysema   . Fibromyalgia   . Osteoporosis   . Rheumatoid arthritis(714.0)   . Sleep apnea    no cpap    Past Surgical History:  Procedure Laterality Date  . ABDOMINAL HYSTERECTOMY    . ANKLE FRACTURE SURGERY  2007  . BREAST SURGERY     mammoplasty and then removed  . FOOT SURGERY     numerous  . I&D EXTREMITY Right 10/26/2014   Procedure: IRRIGATION AND DEBRIDEMENT RIGHT KNEE ;  Surgeon: Gaynelle Arabian, MD;  Location: [facility];  Service: Orthopedics;  Laterality: Right;  . IRRIGATION AND DEBRIDEMENT KNEE Right 03/09/2017   Procedure: IRRIGATION AND DEBRIDEMENT RIGHT KNEE PRE-PATELLAR BURSA;  Surgeon: Susa Day, MD;  Location: [facility];  Service: Orthopedics;  Laterality: Right;  . KNEE BURSECTOMY Right 09/16/2014   Procedure: EXCISON OF PRE PATELLA BURSA RIGHT KNEE ;  Surgeon: Gaynelle Arabian, MD;  Location: [facility];  Service: Orthopedics;  Laterality: Right;  . PARTIAL HYSTERECTOMY  1970  . TOTAL KNEE ARTHROPLASTY  2005 / 2006   x2    Social History   Socioeconomic History  . Marital status: Divorced    Spouse name: Not on file  . Number of children: Not on file  . Years of education: Not on file  . Highest education level: Not on file  Occupational History  . Not on file  Social Needs  . Financial resource strain: Not on file  . Food insecurity:    Worry: Not on file    Inability: Not on file  . Transportation needs:    Medical: Not on file    Non-medical: Not on file  Tobacco Use  . Smoking status: Former Smoker    Last attempt to quit: 03/11/1994    Years since quitting: 24.1  . Smokeless tobacco: Never Used  Substance and Sexual Activity  . Alcohol use: No  . Drug use: No  . Sexual activity: Not on file  Lifestyle  . Physical activity:    Days per week: Not on file    Minutes per session: Not on file  . Stress: Not on file  Relationships  . Social connections:    Talks on phone:  Not on file    Gets together: Not on file    Attends religious service: Not on file    Active member of club or organization: Not on file    Attends meetings of clubs or organizations: Not on file    Relationship status: Not on file  . Intimate partner violence:    Fear of current or ex partner: Not on file    Emotionally abused: Not on file    Physically abused: Not on file    Forced sexual activity: Not on file  Other Topics Concern  . Not on file  Social History Narrative  . Not on file    Family History  Problem Relation Age of Onset  . Hypertension Other   . Diabetes Other   . Stroke Other   . Diabetes Mother   . Diabetes Father   . Diabetes Sister   . Diabetes Maternal Aunt   . Cancer Paternal  Uncle   . Cancer Paternal Grandmother   . Breast cancer Paternal Grandmother     Current Outpatient Medications  Medication Sig Dispense Refill  . Albuterol Sulfate (PROAIR RESPICLICK) 476 (90 Base) MCG/ACT AEPB Inhale 2 puffs into the lungs as needed (every four to six hours for cough or wheeze). 1 each 1  . ALPRAZolam (XANAX) 0.5 MG tablet     . aspirin EC 81 MG tablet Take 81 mg by mouth 2 (two) times a week.     Marland Kitchen atorvastatin (LIPITOR) 40 MG tablet     . cholecalciferol (VITAMIN D) 1000 UNITS tablet Take 1,000 Units by mouth at bedtime.     . Cyanocobalamin (B-12) 2500 MCG TABS Take 2,500 mcg by mouth at bedtime.     . DULoxetine (CYMBALTA) 60 MG capsule Take 60 mg by mouth at bedtime.     Marland Kitchen ezetimibe (ZETIA) 10 MG tablet Take 10 mg by mouth at bedtime.     . fluticasone furoate-vilanterol (BREO ELLIPTA) 200-25 MCG/INH AEPB Inhale 1 puff into the lungs daily. 60 each 5  . gabapentin (NEURONTIN) 300 MG capsule Take 900 mg by mouth at bedtime.     Marland Kitchen loratadine (CLARITIN) 10 MG tablet Take 10 mg by mouth at bedtime.     . metFORMIN (GLUCOPHAGE) 500 MG tablet Take 1,000 mg by mouth at bedtime.     . montelukast (SINGULAIR) 10 MG tablet TAKE ONE TABLET BY MOUTH ONCE DAILY 30 tablet 0  . morphine (MSIR) 30 MG tablet Take 30 mg by mouth daily as needed for severe pain.    . Multiple Vitamin (MULTIVITAMIN WITH MINERALS) TABS tablet Take 1 tablet by mouth at bedtime.    . Omega-3 Fatty Acids (FISH OIL) 1200 MG CAPS Take 1,200 mg by mouth at bedtime.     . ondansetron (ZOFRAN) 4 MG tablet Take 1 tablet (4 mg total) by mouth every 6 (six) hours as needed for nausea. 20 tablet 0  . pantoprazole (PROTONIX) 40 MG tablet Take one tablet once every morning (Patient taking differently: Take 40 mg by mouth at bedtime. ) 30 tablet 5  . predniSONE (DELTASONE) 10 MG tablet Take 10 mg by mouth daily with breakfast.    . ranitidine (ZANTAC) 150 MG tablet TAKE TWO TABLETS EVERY EVENING (Patient taking  differently: Take 300 mg by mouth at bedtime. ) 180 tablet 1  . gabapentin (NEURONTIN) 100 MG capsule Take 300 mg by mouth at bedtime.      No current facility-administered medications for this visit.  Allergies  Allergen Reactions  . Penicillins Hives    Has patient had a PCN reaction causing immediate rash, facial/tongue/throat swelling, SOB or lightheadedness with hypotension: Yes Has patient had a PCN reaction causing severe rash involving mucus membranes or skin necrosis: No Has patient had a PCN reaction that required hospitalization: No Has patient had a PCN reaction occurring within the last 10 years: No If all of the above answers are "NO", then may proceed with Cephalosporin use. Patient has tolerated ceftriaxone in the recent past.  . Sulfamethoxazole-Trimethoprim Nausea And Vomiting    REACTION: stomach problems  . Sulfonamide Derivatives Nausea And Vomiting    REACTION: stomach problems    REVIEW OF SYSTEMS (negative unless checked):   Cardiac:  []  Chest pain or chest pressure? [x]  Shortness of breath upon activity? []  Shortness of breath when lying flat? []  Irregular heart rhythm?  Vascular:  []  Pain in calf, thigh, or hip brought on by walking? []  Pain in feet at night that wakes you up from your sleep? []  Blood clot in your veins? []  Leg swelling?  Pulmonary:  []  Oxygen at home? []  Productive cough? []  Wheezing?  Neurologic:  []  Sudden weakness in arms or legs? []  Sudden numbness in arms or legs? []  Sudden onset of difficult speaking or slurred speech? []  Temporary loss of vision in one eye? []  Problems with dizziness?  Gastrointestinal:  []  Blood in stool? []  Vomited blood?  Genitourinary:  []  Burning when urinating? []  Blood in urine?  Psychiatric:  []  Major depression  Hematologic:  []  Bleeding problems? []  Problems with blood clotting?  Dermatologic:  []  Rashes or ulcers?  Constitutional:  []  Fever or  chills?  Ear/Nose/Throat:  []  Change in hearing? []  Nose bleeds? []  Sore throat?  Musculoskeletal:  [x]  Back pain? [x]  Joint pain? [x]  Muscle pain?   Physical Examination     Vitals:   05/07/18 1550  BP: 132/68  Pulse: 88  Resp: 20  Temp: 97.6 F (36.4 C)  SpO2: 94%  Weight: 187 lb (84.8 kg)  Height: 5\' 5"  (1.651 m)   Body mass index is 31.12 kg/m.  General Alert, O x 3, sitting in a wheelchair without any apparent distress  Head [place]/AT,    Neck Supple, mid-line trachea,    Pulmonary Sym exp, good B air movt, CTA B  Cardiac RRR, Nl S1, S2  Vascular Vessel Right Left  Radial Palpable Palpable  Brachial Palpable Palpable  Carotid Palpable, No Bruit Palpable, No Bruit  Aorta Not palpable N/A  Popliteal Not palpable Not palpable    Gastro- intestinal soft, ND  Musculo- skeletal M/S 5/5 throughout  , Extremities without ischemic changes  , feet symmetrically warm to touch with good cap refill; significant bony abnormalities of finger joints consistent with RA; L palmar arch multiphasic by doppler  Neurologic Cranial nerves 2-12 intact  Psychiatric Judgement intact, Mood & affect appropriate for pt's clinical situation  Dermatologic See M/S exam for extremity exam, No rashes otherwise noted  Lymphatic  Palpable lymph nodes: None     Medical Decision Making   CHARNE MCBRIEN is a 78 y.o. (03/19/1940) female who presents with progressive pain and discoloration of the left index finger   Dr. Oneida Alar was involved in the evaluation and management plan of this patient  Painful and cyanotic tip of left index finger  She is perfusing her left hand well with a palpable radial and ulnar pulse as well as a multiphasic palmar arch signal by Doppler  Differential includes an embolic event  Follow-up as scheduled with hand specialist for potential wound care/surgical intervention if finger appearance worsens  Given family history of aneurysms, check CTA chest abdomen pelvis  next available and follow up with Dr. Terri Skains PA-C [facility]: [phone]  05/07/2018, 4:19 PM

## 2018-05-08 ENCOUNTER — Other Ambulatory Visit: Payer: Self-pay

## 2018-05-08 DIAGNOSIS — Z8249 Family history of ischemic heart disease and other diseases of the circulatory system: Secondary | ICD-10-CM

## 2018-05-11 ENCOUNTER — Other Ambulatory Visit: Payer: Self-pay

## 2018-05-11 DIAGNOSIS — M79645 Pain in left finger(s): Secondary | ICD-10-CM

## 2018-05-12 ENCOUNTER — Ambulatory Visit (HOSPITAL_COMMUNITY): Admission: RE | Admit: 2018-05-12 | Payer: Medicare Other | Source: Ambulatory Visit

## 2018-05-12 ENCOUNTER — Ambulatory Visit (HOSPITAL_COMMUNITY): Payer: Medicare Other

## 2018-05-14 ENCOUNTER — Other Ambulatory Visit (HOSPITAL_COMMUNITY): Payer: Medicare Other

## 2018-05-14 ENCOUNTER — Ambulatory Visit (INDEPENDENT_AMBULATORY_CARE_PROVIDER_SITE_OTHER): Payer: Medicare Other | Admitting: Vascular Surgery

## 2018-05-14 ENCOUNTER — Other Ambulatory Visit: Payer: Self-pay

## 2018-05-14 ENCOUNTER — Ambulatory Visit (HOSPITAL_COMMUNITY)
Admission: RE | Admit: 2018-05-14 | Discharge: 2018-05-14 | Disposition: A | Discharge status: Home / Self Care | Payer: Medicare Other | Source: Ambulatory Visit | Attending: Vascular Surgery | Admitting: Vascular Surgery

## 2018-05-14 ENCOUNTER — Encounter: Payer: Self-pay | Admitting: Vascular Surgery

## 2018-05-14 ENCOUNTER — Ambulatory Visit: Payer: Medicare Other | Admitting: Vascular Surgery

## 2018-05-14 VITALS — BP 140/77 | HR 93 | Temp 97.0°F | Resp 20 | Ht 65.0 in | Wt 182.1 lb

## 2018-05-14 DIAGNOSIS — M79645 Pain in left finger(s): Secondary | ICD-10-CM | POA: Diagnosis not present

## 2018-05-14 DIAGNOSIS — I96 Gangrene, not elsewhere classified: Secondary | ICD-10-CM | POA: Diagnosis not present

## 2018-05-14 DIAGNOSIS — S63268A Dislocation of metacarpophalangeal joint of other finger, initial encounter: Secondary | ICD-10-CM | POA: Diagnosis not present

## 2018-05-14 NOTE — Progress Notes (Signed)
Patient is a 78 year old female who returns for follow-up today.  She was recently seen for duskiness at the tip of her left second finger.  Her last office visit I reviewed her CT scan of the chest abdomen and pelvis that was performed in November 2019.  These showed no evidence of significant atherosclerotic shagginess of the aorta or any evidence of aortic aneurysm.  Today she returns for a left upper extremity duplex with finger pressures.  She states that the tip of her left second finger is very tender to touch.  She thinks the darkened area has gotten larger in size.  She has not had any effects on other digits on her hand.  She apparently has an appointment scheduled with Dr. Burney Gauze from hand surgery later today.  He is currently on prednisone for her rheumatoid arthritis.  She is a former smoker but quit greater than 20 years ago.  Does have diabetes.  Past Medical History:  Diagnosis Date  . Asthma   . Complication of anesthesia    Anesthesia told her years ago she got too cold during surgery  . Diabetes mellitus without complication ([place])   . Emphysema   . Fibromyalgia   . Osteoporosis   . Rheumatoid arthritis(714.0)   . Sleep apnea    no cpap   Social History   Socioeconomic History  . Marital status: Divorced    Spouse name: Not on file  . Number of children: Not on file  . Years of education: Not on file  . Highest education level: Not on file  Occupational History  . Not on file  Social Needs  . Financial resource strain: Not on file  . Food insecurity:    Worry: Not on file    Inability: Not on file  . Transportation needs:    Medical: Not on file    Non-medical: Not on file  Tobacco Use  . Smoking status: Former Smoker    Last attempt to quit: 03/11/1994    Years since quitting: 24.1  . Smokeless tobacco: Never Used  Substance and Sexual Activity  . Alcohol use: No  . Drug use: No  . Sexual activity: Not on file  Lifestyle  . Physical activity:    Days per  week: Not on file    Minutes per session: Not on file  . Stress: Not on file  Relationships  . Social connections:    Talks on phone: Not on file    Gets together: Not on file    Attends religious service: Not on file    Active member of club or organization: Not on file    Attends meetings of clubs or organizations: Not on file    Relationship status: Not on file  . Intimate partner violence:    Fear of current or ex partner: Not on file    Emotionally abused: Not on file    Physically abused: Not on file    Forced sexual activity: Not on file  Other Topics Concern  . Not on file  Social History Narrative  . Not on file    Current Outpatient Medications on File Prior to Visit  Medication Sig Dispense Refill  . aspirin EC 81 MG tablet Take 81 mg by mouth 2 (two) times a week.     Marland Kitchen atorvastatin (LIPITOR) 40 MG tablet     . cholecalciferol (VITAMIN D) 1000 UNITS tablet Take 1,000 Units by mouth at bedtime.     . Cyanocobalamin (B-12) 2500  MCG TABS Take 2,500 mcg by mouth at bedtime.     . DULoxetine (CYMBALTA) 60 MG capsule Take 60 mg by mouth at bedtime.     Marland Kitchen ezetimibe (ZETIA) 10 MG tablet Take 10 mg by mouth at bedtime.     . gabapentin (NEURONTIN) 100 MG capsule Take 300 mg by mouth at bedtime.     . gabapentin (NEURONTIN) 300 MG capsule Take 900 mg by mouth at bedtime.     . metFORMIN (GLUCOPHAGE) 500 MG tablet Take 1,000 mg by mouth at bedtime.     . montelukast (SINGULAIR) 10 MG tablet TAKE ONE TABLET BY MOUTH ONCE DAILY 30 tablet 0  . morphine (MSIR) 30 MG tablet Take 30 mg by mouth daily as needed for severe pain.    . Multiple Vitamin (MULTIVITAMIN WITH MINERALS) TABS tablet Take 1 tablet by mouth at bedtime.    . Omega-3 Fatty Acids (FISH OIL) 1200 MG CAPS Take 1,200 mg by mouth at bedtime.     . pantoprazole (PROTONIX) 40 MG tablet Take one tablet once every morning (Patient taking differently: Take 40 mg by mouth at bedtime. ) 30 tablet 5  . predniSONE (DELTASONE) 10  MG tablet Take 10 mg by mouth daily with breakfast.    . Albuterol Sulfate (PROAIR RESPICLICK) 299 (90 Base) MCG/ACT AEPB Inhale 2 puffs into the lungs as needed (every four to six hours for cough or wheeze). (Patient not taking: Reported on 05/14/2018) 1 each 1  . ALPRAZolam (XANAX) 0.5 MG tablet     . fluticasone furoate-vilanterol (BREO ELLIPTA) 200-25 MCG/INH AEPB Inhale 1 puff into the lungs daily. (Patient not taking: Reported on 05/14/2018) 60 each 5  . loratadine (CLARITIN) 10 MG tablet Take 10 mg by mouth at bedtime.     . ondansetron (ZOFRAN) 4 MG tablet Take 1 tablet (4 mg total) by mouth every 6 (six) hours as needed for nausea. (Patient not taking: Reported on 05/14/2018) 20 tablet 0  . ranitidine (ZANTAC) 150 MG tablet TAKE TWO TABLETS EVERY EVENING (Patient not taking: Reported on 05/14/2018) 180 tablet 1   No current facility-administered medications on file prior to visit.     Allergies  Allergen Reactions  . Penicillins Hives    Has patient had a PCN reaction causing immediate rash, facial/tongue/throat swelling, SOB or lightheadedness with hypotension: Yes Has patient had a PCN reaction causing severe rash involving mucus membranes or skin necrosis: No Has patient had a PCN reaction that required hospitalization: No Has patient had a PCN reaction occurring within the last 10 years: No If all of the above answers are "NO", then may proceed with Cephalosporin use. Patient has tolerated ceftriaxone in the recent past.  . Sulfamethoxazole-Trimethoprim Nausea And Vomiting    REACTION: stomach problems  . Sulfonamide Derivatives Nausea And Vomiting    REACTION: stomach problems    Review of systems: Patient has shortness of breath with exertion.  She occasionally has some wheezing.  She occasionally has some dizziness.  Physical exam:  Vitals:   05/14/18 0905  BP: 140/77  Pulse: 93  Resp: 20  Temp: (!) 97 F (36.1 C)  SpO2: 96%  Weight: 182 lb 1.6 oz (82.6 kg)  Height: 5'  5" (1.651 m)    Left upper extremity: Dusky tip of distal phalanx left second finger 2+ radial pulse  Skin: No open ulceration  Data: Patient had an upper extremity duplex exam today which shows that her subclavian and all downstream upper extremity arterial vessels  are all widely patent with triphasic waveforms no significant occlusive disease or areas that would be possible for distal embolization.  She also had digital pressures performed.  The left second digit was substantially lower than the other 9 digits.  The other 9 digits for the most part had overall normal flow.  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  No evidence of large vessel occlusive disease or evidence of areas that could be a source of distal embolization.  Plan: The patient will follow-up with [place] on an as-needed basis.  She has follow-up scheduled with Dr. Burney Gauze from hand surgery later today.  Ruta Hinds, MD [facility] Office: [phone] Pager: [phone]

## 2018-05-20 DIAGNOSIS — Z79899 Other long term (current) drug therapy: Secondary | ICD-10-CM | POA: Diagnosis not present

## 2018-05-20 DIAGNOSIS — D801 Nonfamilial hypogammaglobulinemia: Secondary | ICD-10-CM | POA: Diagnosis not present

## 2018-05-20 DIAGNOSIS — Z8619 Personal history of other infectious and parasitic diseases: Secondary | ICD-10-CM | POA: Diagnosis not present

## 2018-05-20 DIAGNOSIS — R0602 Shortness of breath: Secondary | ICD-10-CM | POA: Diagnosis not present

## 2018-05-20 DIAGNOSIS — Z87891 Personal history of nicotine dependence: Secondary | ICD-10-CM | POA: Diagnosis not present

## 2018-05-20 DIAGNOSIS — F17210 Nicotine dependence, cigarettes, uncomplicated: Secondary | ICD-10-CM | POA: Diagnosis not present

## 2018-05-20 DIAGNOSIS — M069 Rheumatoid arthritis, unspecified: Secondary | ICD-10-CM | POA: Diagnosis not present

## 2018-05-20 DIAGNOSIS — Z7952 Long term (current) use of systemic steroids: Secondary | ICD-10-CM | POA: Diagnosis not present

## 2018-05-20 DIAGNOSIS — E1152 Type 2 diabetes mellitus with diabetic peripheral angiopathy with gangrene: Secondary | ICD-10-CM | POA: Diagnosis not present

## 2018-05-20 DIAGNOSIS — I96 Gangrene, not elsewhere classified: Secondary | ICD-10-CM | POA: Diagnosis not present

## 2018-05-21 DIAGNOSIS — I96 Gangrene, not elsewhere classified: Secondary | ICD-10-CM | POA: Insufficient documentation

## 2018-05-21 DIAGNOSIS — Z8619 Personal history of other infectious and parasitic diseases: Secondary | ICD-10-CM | POA: Insufficient documentation

## 2018-05-25 DIAGNOSIS — I10 Essential (primary) hypertension: Secondary | ICD-10-CM | POA: Diagnosis not present

## 2018-05-25 DIAGNOSIS — Z79899 Other long term (current) drug therapy: Secondary | ICD-10-CM | POA: Diagnosis not present

## 2018-05-25 DIAGNOSIS — D801 Nonfamilial hypogammaglobulinemia: Secondary | ICD-10-CM | POA: Diagnosis not present

## 2018-05-25 DIAGNOSIS — R0602 Shortness of breath: Secondary | ICD-10-CM | POA: Diagnosis not present

## 2018-05-25 DIAGNOSIS — M059 Rheumatoid arthritis with rheumatoid factor, unspecified: Secondary | ICD-10-CM | POA: Diagnosis not present

## 2018-05-25 DIAGNOSIS — Z7952 Long term (current) use of systemic steroids: Secondary | ICD-10-CM | POA: Diagnosis not present

## 2018-05-25 DIAGNOSIS — E1152 Type 2 diabetes mellitus with diabetic peripheral angiopathy with gangrene: Secondary | ICD-10-CM | POA: Diagnosis not present

## 2018-05-25 DIAGNOSIS — M069 Rheumatoid arthritis, unspecified: Secondary | ICD-10-CM | POA: Diagnosis not present

## 2018-05-25 DIAGNOSIS — Z8619 Personal history of other infectious and parasitic diseases: Secondary | ICD-10-CM | POA: Diagnosis not present

## 2018-05-25 DIAGNOSIS — R700 Elevated erythrocyte sedimentation rate: Secondary | ICD-10-CM | POA: Diagnosis not present

## 2018-05-25 DIAGNOSIS — I96 Gangrene, not elsewhere classified: Secondary | ICD-10-CM | POA: Diagnosis not present

## 2018-06-03 DIAGNOSIS — I96 Gangrene, not elsewhere classified: Secondary | ICD-10-CM | POA: Diagnosis not present

## 2018-06-03 DIAGNOSIS — E1165 Type 2 diabetes mellitus with hyperglycemia: Secondary | ICD-10-CM | POA: Diagnosis not present

## 2018-06-03 DIAGNOSIS — E119 Type 2 diabetes mellitus without complications: Secondary | ICD-10-CM | POA: Insufficient documentation

## 2018-06-03 DIAGNOSIS — E1152 Type 2 diabetes mellitus with diabetic peripheral angiopathy with gangrene: Secondary | ICD-10-CM | POA: Diagnosis not present

## 2018-06-03 DIAGNOSIS — M059 Rheumatoid arthritis with rheumatoid factor, unspecified: Secondary | ICD-10-CM | POA: Diagnosis not present

## 2018-06-03 DIAGNOSIS — Z8619 Personal history of other infectious and parasitic diseases: Secondary | ICD-10-CM | POA: Diagnosis not present

## 2018-06-03 DIAGNOSIS — D803 Selective deficiency of immunoglobulin G [IgG] subclasses: Secondary | ICD-10-CM | POA: Diagnosis not present

## 2018-06-08 DIAGNOSIS — I96 Gangrene, not elsewhere classified: Secondary | ICD-10-CM | POA: Diagnosis not present

## 2018-06-08 DIAGNOSIS — M069 Rheumatoid arthritis, unspecified: Secondary | ICD-10-CM | POA: Diagnosis not present

## 2018-07-07 DIAGNOSIS — M79641 Pain in right hand: Secondary | ICD-10-CM | POA: Diagnosis not present

## 2018-07-07 DIAGNOSIS — M05711 Rheumatoid arthritis with rheumatoid factor of right shoulder without organ or systems involvement: Secondary | ICD-10-CM | POA: Diagnosis not present

## 2018-07-07 DIAGNOSIS — I96 Gangrene, not elsewhere classified: Secondary | ICD-10-CM | POA: Diagnosis not present

## 2018-07-07 DIAGNOSIS — M79642 Pain in left hand: Secondary | ICD-10-CM | POA: Diagnosis not present

## 2018-07-09 ENCOUNTER — Ambulatory Visit: Payer: Medicare Other | Admitting: Sports Medicine

## 2018-08-06 DIAGNOSIS — M069 Rheumatoid arthritis, unspecified: Secondary | ICD-10-CM | POA: Diagnosis not present

## 2018-08-06 DIAGNOSIS — I96 Gangrene, not elsewhere classified: Secondary | ICD-10-CM | POA: Diagnosis not present

## 2018-08-06 DIAGNOSIS — E119 Type 2 diabetes mellitus without complications: Secondary | ICD-10-CM | POA: Diagnosis not present

## 2018-08-18 ENCOUNTER — Telehealth: Payer: Self-pay | Admitting: Allergy and Immunology

## 2018-08-18 NOTE — Telephone Encounter (Signed)
[name] called in and stated she has broken out in hives.  [name] stated this started 4 days ago and had gotten better, but then last night it came back "with a vengeance"   [name] states it is all over her thighs and legs.  [name] does not want to come into the office but is willing to do a Tele-visit.  She would like to have something called in if possible.  She uses [facility] on [address]. Please advise.

## 2018-08-19 NOTE — Telephone Encounter (Signed)
Left message for [name] to call office.

## 2018-08-19 NOTE — Telephone Encounter (Signed)
Spoke with [name], she will try the cetirizine and she already has a script for Prednisone. She will call us if she has anymore issues.

## 2018-08-19 NOTE — Telephone Encounter (Signed)
Please inform [name] that we can have her use cetirizine 10mg  tablet at 1-2 tablets 1-2 times per day up to a maximum of 40 mg daily and take prednisone 10mg  daily for 10 days only and let us know how this works.

## 2018-09-22 DIAGNOSIS — Z8619 Personal history of other infectious and parasitic diseases: Secondary | ICD-10-CM | POA: Diagnosis not present

## 2018-09-22 DIAGNOSIS — I96 Gangrene, not elsewhere classified: Secondary | ICD-10-CM | POA: Diagnosis not present

## 2018-09-22 DIAGNOSIS — Z79899 Other long term (current) drug therapy: Secondary | ICD-10-CM | POA: Diagnosis not present

## 2018-09-22 DIAGNOSIS — M069 Rheumatoid arthritis, unspecified: Secondary | ICD-10-CM | POA: Diagnosis not present

## 2018-09-24 DIAGNOSIS — M069 Rheumatoid arthritis, unspecified: Secondary | ICD-10-CM | POA: Diagnosis not present

## 2018-09-24 DIAGNOSIS — D849 Immunodeficiency, unspecified: Secondary | ICD-10-CM | POA: Diagnosis not present

## 2018-10-01 DIAGNOSIS — R768 Other specified abnormal immunological findings in serum: Secondary | ICD-10-CM | POA: Diagnosis not present

## 2018-10-01 DIAGNOSIS — M0589 Other rheumatoid arthritis with rheumatoid factor of multiple sites: Secondary | ICD-10-CM | POA: Diagnosis not present

## 2018-10-01 DIAGNOSIS — E119 Type 2 diabetes mellitus without complications: Secondary | ICD-10-CM | POA: Diagnosis not present

## 2018-10-01 DIAGNOSIS — M069 Rheumatoid arthritis, unspecified: Secondary | ICD-10-CM | POA: Diagnosis not present

## 2018-10-01 DIAGNOSIS — D803 Selective deficiency of immunoglobulin G [IgG] subclasses: Secondary | ICD-10-CM | POA: Diagnosis not present

## 2018-10-01 DIAGNOSIS — M159 Polyosteoarthritis, unspecified: Secondary | ICD-10-CM | POA: Diagnosis not present

## 2018-10-01 DIAGNOSIS — Z79899 Other long term (current) drug therapy: Secondary | ICD-10-CM | POA: Diagnosis not present

## 2018-10-09 DIAGNOSIS — D513 Other dietary vitamin B12 deficiency anemia: Secondary | ICD-10-CM | POA: Diagnosis not present

## 2018-10-09 DIAGNOSIS — D6489 Other specified anemias: Secondary | ICD-10-CM | POA: Diagnosis not present

## 2018-10-09 DIAGNOSIS — R7989 Other specified abnormal findings of blood chemistry: Secondary | ICD-10-CM | POA: Diagnosis not present

## 2018-10-09 DIAGNOSIS — H903 Sensorineural hearing loss, bilateral: Secondary | ICD-10-CM | POA: Diagnosis not present

## 2018-10-09 DIAGNOSIS — E1151 Type 2 diabetes mellitus with diabetic peripheral angiopathy without gangrene: Secondary | ICD-10-CM | POA: Diagnosis not present

## 2018-10-12 DIAGNOSIS — Z96651 Presence of right artificial knee joint: Secondary | ICD-10-CM | POA: Diagnosis not present

## 2018-10-12 DIAGNOSIS — M25562 Pain in left knee: Secondary | ICD-10-CM | POA: Diagnosis not present

## 2018-10-12 DIAGNOSIS — Z471 Aftercare following joint replacement surgery: Secondary | ICD-10-CM | POA: Diagnosis not present

## 2018-10-15 ENCOUNTER — Encounter: Payer: Self-pay | Admitting: Internal Medicine

## 2018-10-16 DIAGNOSIS — D509 Iron deficiency anemia, unspecified: Secondary | ICD-10-CM | POA: Diagnosis not present

## 2018-10-16 DIAGNOSIS — Z86010 Personal history of colonic polyps: Secondary | ICD-10-CM | POA: Diagnosis not present

## 2018-10-20 ENCOUNTER — Other Ambulatory Visit (HOSPITAL_COMMUNITY): Payer: Self-pay | Admitting: *Deleted

## 2018-10-21 ENCOUNTER — Encounter (HOSPITAL_COMMUNITY)
Admission: RE | Admit: 2018-10-21 | Discharge: 2018-10-21 | Disposition: A | Discharge status: Home / Self Care | Payer: Medicare Other | Source: Ambulatory Visit | Attending: Internal Medicine | Admitting: Internal Medicine

## 2018-10-21 ENCOUNTER — Other Ambulatory Visit: Payer: Self-pay

## 2018-10-21 DIAGNOSIS — D649 Anemia, unspecified: Secondary | ICD-10-CM | POA: Diagnosis not present

## 2018-10-21 MED ORDER — SODIUM CHLORIDE 0.9 % IV SOLN
510.0000 mg | INTRAVENOUS | Status: DC
  Administered 2018-10-21: 510 mg via INTRAVENOUS
  Filled 2018-10-21: qty 17

## 2018-10-28 ENCOUNTER — Encounter (HOSPITAL_COMMUNITY)
Admission: RE | Admit: 2018-10-28 | Discharge: 2018-10-28 | Disposition: A | Discharge status: Home / Self Care | Payer: Medicare Other | Source: Ambulatory Visit | Attending: Internal Medicine | Admitting: Internal Medicine

## 2018-10-28 DIAGNOSIS — D649 Anemia, unspecified: Secondary | ICD-10-CM | POA: Diagnosis not present

## 2018-10-28 MED ORDER — SODIUM CHLORIDE 0.9 % IV SOLN
510.0000 mg | INTRAVENOUS | Status: AC
  Administered 2018-10-28: 510 mg via INTRAVENOUS
  Filled 2018-10-28: qty 17

## 2018-11-04 DIAGNOSIS — Z23 Encounter for immunization: Secondary | ICD-10-CM | POA: Diagnosis not present

## 2018-11-04 DIAGNOSIS — E7849 Other hyperlipidemia: Secondary | ICD-10-CM | POA: Diagnosis not present

## 2018-11-05 DIAGNOSIS — D509 Iron deficiency anemia, unspecified: Secondary | ICD-10-CM | POA: Diagnosis not present

## 2018-11-05 DIAGNOSIS — D649 Anemia, unspecified: Secondary | ICD-10-CM | POA: Diagnosis not present

## 2018-11-06 DIAGNOSIS — M6281 Muscle weakness (generalized): Secondary | ICD-10-CM | POA: Diagnosis not present

## 2018-11-06 DIAGNOSIS — Z96651 Presence of right artificial knee joint: Secondary | ICD-10-CM | POA: Diagnosis not present

## 2018-11-06 DIAGNOSIS — M25561 Pain in right knee: Secondary | ICD-10-CM | POA: Diagnosis not present

## 2018-11-06 DIAGNOSIS — R2689 Other abnormalities of gait and mobility: Secondary | ICD-10-CM | POA: Diagnosis not present

## 2018-11-11 DIAGNOSIS — Z79899 Other long term (current) drug therapy: Secondary | ICD-10-CM | POA: Diagnosis not present

## 2018-11-11 DIAGNOSIS — R5382 Chronic fatigue, unspecified: Secondary | ICD-10-CM | POA: Diagnosis not present

## 2018-11-11 DIAGNOSIS — E119 Type 2 diabetes mellitus without complications: Secondary | ICD-10-CM | POA: Diagnosis not present

## 2018-11-11 DIAGNOSIS — R768 Other specified abnormal immunological findings in serum: Secondary | ICD-10-CM | POA: Diagnosis not present

## 2018-11-11 DIAGNOSIS — M069 Rheumatoid arthritis, unspecified: Secondary | ICD-10-CM | POA: Diagnosis not present

## 2018-11-11 DIAGNOSIS — M059 Rheumatoid arthritis with rheumatoid factor, unspecified: Secondary | ICD-10-CM | POA: Diagnosis not present

## 2018-11-11 DIAGNOSIS — M159 Polyosteoarthritis, unspecified: Secondary | ICD-10-CM | POA: Diagnosis not present

## 2018-11-11 DIAGNOSIS — Z794 Long term (current) use of insulin: Secondary | ICD-10-CM | POA: Diagnosis not present

## 2018-11-17 DIAGNOSIS — Z79899 Other long term (current) drug therapy: Secondary | ICD-10-CM | POA: Diagnosis not present

## 2018-11-27 DIAGNOSIS — Z87898 Personal history of other specified conditions: Secondary | ICD-10-CM | POA: Diagnosis not present

## 2018-11-27 DIAGNOSIS — H9042 Sensorineural hearing loss, unilateral, left ear, with unrestricted hearing on the contralateral side: Secondary | ICD-10-CM | POA: Diagnosis not present

## 2018-11-27 DIAGNOSIS — Z8669 Personal history of other diseases of the nervous system and sense organs: Secondary | ICD-10-CM | POA: Diagnosis not present

## 2018-11-27 DIAGNOSIS — H6123 Impacted cerumen, bilateral: Secondary | ICD-10-CM | POA: Diagnosis not present

## 2018-12-28 DIAGNOSIS — E119 Type 2 diabetes mellitus without complications: Secondary | ICD-10-CM | POA: Diagnosis not present

## 2018-12-28 DIAGNOSIS — Z7952 Long term (current) use of systemic steroids: Secondary | ICD-10-CM | POA: Diagnosis not present

## 2018-12-28 DIAGNOSIS — Z79899 Other long term (current) drug therapy: Secondary | ICD-10-CM | POA: Diagnosis not present

## 2018-12-28 DIAGNOSIS — R5383 Other fatigue: Secondary | ICD-10-CM | POA: Diagnosis not present

## 2018-12-28 DIAGNOSIS — M059 Rheumatoid arthritis with rheumatoid factor, unspecified: Secondary | ICD-10-CM | POA: Diagnosis not present

## 2019-01-05 ENCOUNTER — Other Ambulatory Visit: Payer: Self-pay

## 2019-01-07 DIAGNOSIS — E103293 Type 1 diabetes mellitus with mild nonproliferative diabetic retinopathy without macular edema, bilateral: Secondary | ICD-10-CM | POA: Diagnosis not present

## 2019-01-07 DIAGNOSIS — Z961 Presence of intraocular lens: Secondary | ICD-10-CM | POA: Diagnosis not present

## 2019-01-07 DIAGNOSIS — H524 Presbyopia: Secondary | ICD-10-CM | POA: Diagnosis not present

## 2019-01-08 ENCOUNTER — Encounter: Payer: Self-pay | Admitting: *Deleted

## 2019-01-08 ENCOUNTER — Other Ambulatory Visit: Payer: Self-pay | Admitting: *Deleted

## 2019-01-08 DIAGNOSIS — H903 Sensorineural hearing loss, bilateral: Secondary | ICD-10-CM | POA: Insufficient documentation

## 2019-01-08 DIAGNOSIS — H9312 Tinnitus, left ear: Secondary | ICD-10-CM | POA: Insufficient documentation

## 2019-01-08 DIAGNOSIS — H905 Unspecified sensorineural hearing loss: Secondary | ICD-10-CM | POA: Insufficient documentation

## 2019-01-08 NOTE — Patient Outreach (Signed)
[place] [facility]) Care Management  [place]  01/08/2019   [name] 1940/10/07 [id number]   Initial Telephone Assessment  Referral Date:  01/05/2019 Referral Source:  EMMI Prevent Reason for Referral:  Disease Management Education Insurance:  Medicare   Outreach Attempt:  Successful telephone outreach to patient for telephone screening post EMMI Prevent call.  HIPAA verified with patient.  RN Health Coach introduced self and role.  [facility] services reviewed and discussed.  Patient completed telephone screening.  Verbally agrees to [facility] services.  Patient completed initial telephone assessment.  Social:  Lives at home alone.  Reports being independent with ADLs and needing assistance with IADLs, stating it is hard for to complete house cleaning.  Ambulates very short distances with walker and mainly uses motorized chair.  Reports 1 fall in the last year without injury related to slipping on gravel in her driveway.  Currently patient drives herself to her medical appointments but has issues with retrieving the walker from the back of the car once she gets to the provider office and walking into the office.  Tries to call ahead and request staff to assist with wheelchair.  Patient reporting she cannot manually manipulate a wheelchair on her own due to fingers being deformed from rheumatoid arthritis.  Discussed [place] Work referral for transportation options that would allow for her to use her motorized chair and patient agrees.  DME in the home include:  Rolling walker, straight cane, electric scooter, CBG meter, bedside commode, tub transfer bench, reading glasses, grab bar in shower and at toilet, manual wheelchair, and scale.  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  Denies any recent hospitalizations or emergency room visits.  Reports blood sugars were elevated about 3 months ago and she notified her primary care provider and was initiated on Insulin 70/30 twice a day.  States her blood sugars are better now after initiation of insulin.  Acknowledges she continues to eat sweets and only monitors her blood sugars about 3 times a week.  Fasting blood sugar this morning was 123 with fasting ranges in 140's.  Does report fasting blood sugar of 83 a few days ago and states she can not tell when her blood sugar is low/hypoglycemic.  Last Hgb A1C is 7.5 on 08/06/2018.  Patient reports several right knee surgeries with continued knee pain and states this is the reason she is wheelchair bound and can only walk very short distances.  States she needs another surgery but does not want to have it.  Reports she has periods of severe muscle pain that hurts lifting her head of the bed.  States these times are different then her rheumatoid flares.  Medications:  Reports taking about 10 medications daily.  Reports managing her medications herself without difficulties and filling a months work of pill boxes.  Denies any issues with affording medications.  Does wonder if her muscle pain is related to her Lipitor and would like to speak with [facility] Pharmacist for medication review.  Encounter Medications:  Outpatient Encounter Medications as of 01/08/2019  Medication Sig Note  . atorvastatin (LIPITOR) 40 MG tablet    . cholecalciferol (VITAMIN D) 1000 UNITS tablet Take 1,000 Units by mouth at bedtime.    . Cyanocobalamin (B-12) 2500  MCG TABS Take 2,500 mcg by mouth at bedtime.    . DULoxetine (CYMBALTA) 60 MG capsule Take 60 mg by mouth at bedtime.    Marland Kitchen ezetimibe (ZETIA) 10 MG tablet Take 10 mg by mouth at bedtime.    . ferrous sulfate 325 (65 FE) MG tablet Take by mouth.   . hydroxychloroquine (PLAQUENIL) 200 MG tablet Take by mouth.    . insulin NPH-regular Human (70-30) 100 UNIT/ML injection Inject 14 Units into the skin 2 (two) times daily with a meal.   . metFORMIN (GLUCOPHAGE) 500 MG tablet Take 1,000 mg by mouth at bedtime.    Marland Kitchen morphine (MSIR) 30 MG tablet Take 30 mg by mouth daily as needed for severe pain.   . Multiple Vitamin (MULTIVITAMIN WITH MINERALS) TABS tablet Take 1 tablet by mouth at bedtime.   . Omega-3 Fatty Acids (FISH OIL) 1200 MG CAPS Take 1,200 mg by mouth at bedtime.    [name]    . pantoprazole (PROTONIX) 40 MG tablet Take one tablet once every morning (Patient taking differently: Take 40 mg by mouth at bedtime. ) 01/08/2019: Reports taking 2 tablets at bedtime to make 80 mg  . predniSONE (DELTASONE) 10 MG tablet Take 10 mg by mouth daily with breakfast. 01/08/2019: Reports taking 5 mg daily, but does increase with arthritis attacks  . Albuterol Sulfate (PROAIR RESPICLICK) 123XX123 (90 Base) MCG/ACT AEPB Inhale 2 puffs into the lungs as needed (every four to six hours for cough or wheeze). (Patient not taking: Reported on 05/14/2018)   . ALPRAZolam (XANAX) 0.5 MG tablet    . aspirin EC 81 MG tablet Take 81 mg by mouth 2 (two) times a week.    . fluticasone furoate-vilanterol (BREO ELLIPTA) 200-25 MCG/INH AEPB Inhale 1 puff into the lungs daily. (Patient not taking: Reported on 05/14/2018)   . gabapentin (NEURONTIN) 100 MG capsule Take 300 mg by mouth at bedtime.    . gabapentin (NEURONTIN) 300 MG capsule Take 900 mg by mouth at bedtime.    Marland Kitchen loratadine (CLARITIN) 10 MG tablet Take 10 mg by mouth at bedtime.    . montelukast (SINGULAIR) 10 MG tablet TAKE ONE TABLET BY MOUTH ONCE DAILY (Patient not taking: Reported on 01/08/2019)   . ondansetron (ZOFRAN) 4 MG tablet Take 1 tablet (4 mg total) by mouth every 6 (six) hours as needed for nausea. (Patient not taking: Reported on 05/14/2018)   . ranitidine (ZANTAC) 150 MG tablet TAKE TWO TABLETS EVERY EVENING (Patient not taking: Reported on 05/14/2018)     No facility-administered encounter medications on file as of 01/08/2019.     Functional Status:  In your present state of health, do you have any difficulty performing the following activities: 01/08/2019 01/18/2018  Hearing? Y N  Comment left ear hearing loss but no hearing aid -  Vision? N N  Difficulty concentrating or making decisions? N N  Walking or climbing stairs? Y Y  Comment only walk short distances, cannot climb stairs -  Dressing or bathing? N N  Doing errands, shopping? Y N  Comment difficulty with errands due to needing motorized chair -  Preparing Food and eating ? N -  Using the Toilet? N -  In the past six months, have you accidently leaked urine? N -  Comment occasional incontinence issues -  Do you have problems with loss of bowel control? N -  Managing your Medications? N -  Managing your Finances? N -  Housekeeping or managing  your Housekeeping? Y -  Comment difficulties with house cleaning -  Some recent data might be hidden    Fall/Depression Screening: Fall Risk  01/08/2019 04/08/2017  Falls in the past year? 1 Yes  Number falls in past yr: 0 1  Injury with Fall? 0 Yes  Risk for fall due to : History of fall(s);Medication side effect;Impaired balance/gait;Impaired mobility;Impaired vision;Orthopedic patient -  Follow up Falls evaluation completed;Education provided;Falls prevention discussed -   PHQ 2/9 Scores 01/08/2019 04/08/2017  PHQ - 2 Score 0 0   THN CM Care Plan Problem One     Most Recent Value  Care Plan Problem One  Knowledge deficiet related to self care management of diabetes.  Role Documenting the Problem One  [place] for Problem One  Active  THN Long Term Goal   Patient will maintain Hgb A1C of 7.5 or below within the next 90 days.  THN Long Term Goal Start Date  01/08/19  Interventions for Problem One Long Term Goal  Reviewed and discussed care plan and goals with patient, discussed current Hgb A1C and goal,  reviewed medications and indications and encouraged medication compliance, discussed diabetic food and drink options, discussed carbohydrate counting with patient, encouraged to keep and attend scheduled medical appointments, [place] referral for medication questions, [facility] Social Work referral for transportation options  [facility] CM Short Term Goal #1   Patient will report monitoring blood sugars atleast once a day within the next 30 days.  THN CM Short Term Goal #1 Start Date  01/08/19  Interventions for Short Term Goal #1  Discussed importance of monitoring blood sugars, discussed dangers of hypoglycemia, encouraged patient to monitor blood sugars at least once a day and need for twice a day monitoring with insulin, confirmed patient knows how to check blood sugars, sending 2020 Calendar Booklet to help with documentation of blood sugars     Appointments: Patient reports last attending appointment with primary care provider in December of 2019.  States she has a physical scheduled for November 2020.  Last appointment with Rheumatology was 12/28/2018.  Advanced Directives:  Reports having a Living Will and [place] and does not want to make any changes at this time.  Patient also states she has a Do Not Resuscitate order.   Consent:  [facility] services reviewed and discussed.  Patient verbally agrees to Disease Management outreaches, [place] referral for medication review, and [facility] Social Work referral for transportation resources.  Plan: RN Health Coach will send [facility] SW referral for possible assistance with resources related to transportation. RN Health Coach will send [place] referral for medication review and possible Lipitor causing muscle pain. RN Health Coach will send primary MD barriers letter. RN Health Coach will route initial telephone assessment note to primary MD. [place] will send patient [place]. RN Health Coach will send patient 2020  Calendar Booklet. RN Health Coach will send patient Living Well with Diabetes Educational Packet. RN Health Coach will send patient EMMI Diabetes: Insulin Basics. RN Health Coach will make next telephone outreach to patient within the month of November.  [place] [phone] [name]

## 2019-01-11 ENCOUNTER — Other Ambulatory Visit: Payer: Self-pay | Admitting: Pharmacist

## 2019-01-11 NOTE — Patient Outreach (Addendum)
[place] [facility]) Care Management  [place]   01/11/2019  [name] 29-Oct-1940 [id number]  Reason for referral: Medication Management  Referral source: [place] Current insurance: Unknown  PMHx includes but not limited to:  RA on long-term use of immunosuppressants, OA, left ear hearing loss, T2DM, (daughter is diabetic educator), hx bilateral knee replacements however had right knee infections requiring antibiotic spacer, now removed and repair of RKA, COPD, GERD  Outreach:  Successful telephone call with patient.  HIPAA identifiers verified.   Subjective:  Patient states she has been on atorvastatin for a long time and has frequent muscle pain and soreness.  She has discussed this with her PCP who does not think it is related to her statin however patient continues to have questions about it because the pain is sporadic and not specific to her joints.  She also has several questions about diabetes management.  She is agreeable to review medications.    Objective: The ASCVD Risk score Mikey Bussing DC Jr., et al., 2013) failed to calculate for the following reasons:   Cannot find a previous HDL lab   Cannot find a previous total cholesterol lab  Lab Results  Component Value Date   CREATININE 0.87 01/19/2018   CREATININE 0.84 01/18/2018   CREATININE 1.05 (H) 01/17/2018    No results found for: HGBA1C   Lipid Panel  No results found for: CHOL, TRIG, HDL, CHOLHDL, VLDL, LDLCALC, LDLDIRECT  BP Readings from Last 3 Encounters:  10/28/18 (!) 119/54  10/21/18 122/64  05/14/18 140/77    Allergies  Allergen Reactions  . Penicillins Hives    Has patient had a PCN reaction causing immediate rash, facial/tongue/throat swelling, SOB or lightheadedness with hypotension: Yes Has patient had a PCN reaction causing severe rash involving mucus membranes or skin necrosis: No Has patient had a PCN reaction that required hospitalization: No Has patient had a PCN  reaction occurring within the last 10 years: No If all of the above answers are "NO", then may proceed with Cephalosporin use. Patient has tolerated ceftriaxone in the recent past.  . Sulfamethoxazole-Trimethoprim Nausea And Vomiting    REACTION: stomach problems  . Sulfonamide Derivatives Nausea And Vomiting    REACTION: stomach problems    Medications Reviewed Today    Reviewed by Leona Singleton, RN (Registered Nurse) on 01/08/19 at 1619  Med List Status: <None>  Medication Order Taking? Sig Documenting Provider Last Dose Status Informant  Albuterol Sulfate (PROAIR RESPICLICK) 123XX123 (90 Base) MCG/ACT AEPB [id number] No Inhale 2 puffs into the lungs as needed (every four to six hours for cough or wheeze).  Patient not taking: Reported on 05/14/2018   Jiles Prows, MD Not Taking Active Self           Med Note Alfonse Spruce, ANH T   Sat Jan 17, 2018  6:25 PM)    ALPRAZolam Duanne Moron) 0.5 MG tablet [id number] No  [provider] Not Taking Active   aspirin EC 81 MG tablet [id number] No Take 81 mg by mouth 2 (two) times a week.  [provider] Not Taking Active Self           Med Note Alfonse Spruce, ANH T   Sat Jan 17, 2018  6:08 PM)    atorvastatin (LIPITOR) 40 MG tablet [id number] Yes  [provider] Taking Active   cholecalciferol (VITAMIN D) 1000 UNITS tablet [id number] Yes Take 1,000 Units by mouth at bedtime.  [provider] Taking Active Self  Cyanocobalamin (B-12) 2500 MCG TABS [id number] Yes Take 2,500 mcg by mouth at bedtime.  [provider] Taking Active Self  DULoxetine (CYMBALTA) 60 MG capsule [id number] Yes Take 60 mg by mouth at bedtime.  [provider] Taking Active Self           Med Note Alfonse Spruce, ANH T   Sat Jan 17, 2018  6:17 PM)    ezetimibe (ZETIA) 10 MG tablet [id number] Yes Take 10 mg by mouth at bedtime.  [provider] Taking Active Self  ferrous sulfate 325 (65 FE) MG tablet [id number] Yes Take by mouth. [provider] Taking Active   fluticasone furoate-vilanterol (BREO ELLIPTA) 200-25 MCG/INH AEPB [id number] No Inhale 1 puff into the lungs daily.  Patient not taking: Reported on 05/14/2018   Jiles Prows, MD Not Taking Active Self           Med Note Alfonse Spruce, ANH T   Sat Jan 17, 2018  6:17 PM)    gabapentin (NEURONTIN) 100 MG capsule [id number] No Take 300 mg by mouth at bedtime.  [provider] Not Taking Active Self  gabapentin (NEURONTIN) 300 MG capsule [id number] No Take 900 mg by mouth at bedtime.  [provider] Not Taking Active   hydroxychloroquine (PLAQUENIL) 200 MG tablet [id number] Yes Take by mouth. [provider]  Active   insulin NPH-regular Human (70-30) 100 UNIT/ML injection [id number] Yes Inject 14 Units into the skin 2 (two) times daily with a meal. [provider] Taking Active Self  loratadine (CLARITIN) 10 MG tablet [id number] No Take 10 mg by mouth at bedtime.  [provider] Not Taking Active Self  metFORMIN (GLUCOPHAGE) 500 MG tablet [id number] Yes Take 1,000 mg by mouth at bedtime.  [provider] Taking Active Self           Med Note Eulas Post, CRYSTAL D   Fri Sep 13, 2016  5:41 AM)    montelukast (SINGULAIR) 10 MG tablet [id number] No TAKE ONE TABLET BY MOUTH ONCE DAILY  Patient not taking: Reported on 01/08/2019   Jiles Prows, MD Not Taking Active Self  morphine (MSIR) 30 MG tablet [id number] Yes Take 30 mg by mouth daily as needed for severe pain. [provider] Taking Active Self           Med Note Alfonse Spruce, ANH T   Sat Jan 17, 2018  6:14 PM)    Multiple Vitamin (MULTIVITAMIN WITH MINERALS) TABS tablet [id number] Yes Take 1 tablet by mouth at bedtime. [provider] Taking Active Self  Omega-3 Fatty Acids (FISH OIL) 1200 MG CAPS [id number] Yes Take 1,200 mg by mouth at bedtime.  [provider] Taking Active Self  ondansetron (ZOFRAN) 4 MG tablet [id number] No Take 1 tablet (4 mg total) by  mouth every 6 (six) hours as needed for nausea.  Patient not taking: Reported on 05/14/2018   Aline August, MD Not Taking Active   [facility] VERIO test strip [id number] Yes  [provider] Taking Active   pantoprazole (PROTONIX) 40 MG tablet [id number] Yes Take one tablet once every morning  Patient taking differently: Take 40 mg by mouth at bedtime.    Jiles Prows, MD Taking Active Self           Med Note Hubert Azure D   Fri Jan 08, 2019  4:16 PM) Reports taking 2 tablets at bedtime to make 80 mg  predniSONE (DELTASONE) 10 MG tablet [id number] Yes Take 10 mg  by mouth daily with breakfast. [provider] Taking Active            Med Note Shelby Mattocks, [facility] D   Fri Jan 08, 2019  4:16 PM) Reports taking 5 mg daily, but does increase with arthritis attacks  ranitidine (ZANTAC) 150 MG tablet [id number] No TAKE TWO TABLETS EVERY EVENING  Patient not taking: Reported on 05/14/2018   Jiles Prows, MD Not Taking Active Self          Assessment: Drugs sorted by system:  Neurologic/Psychologic: duloxetine  Hematologic: ferrous sulfate  Cardiovascular:  Atorvastatin, ezetimibe, omega-3 fatty acids  Pulmonary/Allergy: cetirizine  Gastrointestinal: pantoprazole  Endocrine: metformin, Novolin 70/30  Pain: morphine  Vitamins/Minerals/Supplements: cholecalciferol, cyanocobalamin, MVI  Miscellaneous: hydroxychloroquine, prednisone  Medication Review Findings:  . Removed several medications that patient no longer taking: albuterol inhaler, fluticasone-vilanterol inhaler, claritin, aspirin, alprazolam, gabapentin, montelukast, ondansetron, ranitidine . Reviewed insulin dosing at length.  Patient reports she often wakes up later morning and may take doses of insulin 70/30 at 1:00PM and 4:00PM, only 3 hours apart from each other.  She reports that PCP has ok'd this.  We reviewed usual half-life of regular and NPH insulin and ideally taking 10-12 hours apart to have insulin  coverage 24 hours / day.  Patient voiced understanding.   . Not checking CBGs regularly.  Reviewed importance of checking CBGs at least daily and keeping a logbook of reading / time / date / and if fasting, post prandial, ect.  Patient reports she will try to start doing this . Reviewed side effects of statins and possible muscle aches and pains.  Patient has office visit with PCP in 2 weeks and will discuss again with him.  She may need to trial off statin to see if pain improves.  We reviewed alternative dosing if statin needs to be adjusted including 3x weekly dosing and changing to rosuvastatin.  She is agreeable for me to f/u with her later this month to see how she is doing with statin therapy.  . Reviewed medication costs, co-pays, and coverage gap information for 2021.  Patient is retired Advertising account planner and is very familiar with plan information.   Plan: . F/u with patient in 3 weeks re: statin and CBGs  Ralene Bathe, PharmD, [place] [phone]

## 2019-01-13 ENCOUNTER — Other Ambulatory Visit: Payer: Self-pay

## 2019-01-13 NOTE — Patient Outreach (Signed)
[place] [facility]) Care Management  01/13/2019  [name] 03/27/1940 [id number]   Social Work referral received from [facility], Hubert Azure.  "Patient needs transportation resources that would allow her to use her motorized scooter to appointments[place]" Unsuccessful outreach to patient today.  Left voicemail message and mailed Unsuccessful Outreach Letter.  Will attempt to reach again within four business days.  Ronn Melena, BSW Social Worker [phone]

## 2019-01-15 ENCOUNTER — Ambulatory Visit: Payer: Self-pay

## 2019-01-18 DIAGNOSIS — Z89521 Acquired absence of right knee: Secondary | ICD-10-CM | POA: Diagnosis not present

## 2019-01-18 DIAGNOSIS — E1151 Type 2 diabetes mellitus with diabetic peripheral angiopathy without gangrene: Secondary | ICD-10-CM | POA: Diagnosis not present

## 2019-01-18 DIAGNOSIS — Z1331 Encounter for screening for depression: Secondary | ICD-10-CM | POA: Diagnosis not present

## 2019-01-18 DIAGNOSIS — R269 Unspecified abnormalities of gait and mobility: Secondary | ICD-10-CM | POA: Diagnosis not present

## 2019-01-18 DIAGNOSIS — R0609 Other forms of dyspnea: Secondary | ICD-10-CM | POA: Diagnosis not present

## 2019-01-18 DIAGNOSIS — Z0000 Encounter for general adult medical examination without abnormal findings: Secondary | ICD-10-CM | POA: Diagnosis not present

## 2019-01-18 DIAGNOSIS — Z79899 Other long term (current) drug therapy: Secondary | ICD-10-CM | POA: Diagnosis not present

## 2019-01-18 DIAGNOSIS — D649 Anemia, unspecified: Secondary | ICD-10-CM | POA: Diagnosis not present

## 2019-01-18 DIAGNOSIS — N183 Chronic kidney disease, stage 3 unspecified: Secondary | ICD-10-CM | POA: Diagnosis not present

## 2019-01-18 DIAGNOSIS — J849 Interstitial pulmonary disease, unspecified: Secondary | ICD-10-CM | POA: Diagnosis not present

## 2019-01-18 DIAGNOSIS — R809 Proteinuria, unspecified: Secondary | ICD-10-CM | POA: Diagnosis not present

## 2019-01-18 DIAGNOSIS — D8989 Other specified disorders involving the immune mechanism, not elsewhere classified: Secondary | ICD-10-CM | POA: Diagnosis not present

## 2019-01-18 DIAGNOSIS — H9190 Unspecified hearing loss, unspecified ear: Secondary | ICD-10-CM | POA: Diagnosis not present

## 2019-01-18 DIAGNOSIS — M810 Age-related osteoporosis without current pathological fracture: Secondary | ICD-10-CM | POA: Diagnosis not present

## 2019-01-18 DIAGNOSIS — M79645 Pain in left finger(s): Secondary | ICD-10-CM | POA: Diagnosis not present

## 2019-01-18 DIAGNOSIS — E7849 Other hyperlipidemia: Secondary | ICD-10-CM | POA: Diagnosis not present

## 2019-01-18 DIAGNOSIS — M5136 Other intervertebral disc degeneration, lumbar region: Secondary | ICD-10-CM | POA: Diagnosis not present

## 2019-01-19 ENCOUNTER — Other Ambulatory Visit: Payer: Self-pay

## 2019-01-19 ENCOUNTER — Ambulatory Visit: Payer: Self-pay

## 2019-01-19 NOTE — Patient Outreach (Signed)
[place] [facility]) Care Management  01/19/2019  [name] May 28, 1940 [id number]   Social Work referral received from [facility], Hubert Azure.  "Patient needs transportation resources that would allow her to use her motorized scooter to appointments, [place] other options [place]" Second unsuccessful outreach to patient today.  Called home number multiple times and got busy signal each time. Left voicemail message on mobile number. Unsuccessful Outreach Letter mailed on 01/13/19. Will attempt to reach again within four business days.  Ronn Melena, BSW Social Worker [phone]

## 2019-01-21 ENCOUNTER — Ambulatory Visit: Payer: Self-pay

## 2019-01-21 ENCOUNTER — Other Ambulatory Visit: Payer: Self-pay

## 2019-01-21 NOTE — Patient Outreach (Signed)
[place] [facility]) Care Management  01/21/2019  [name] March 21, 1940 [id number]   Social Work referral received from [facility], Hubert Azure. "Patient needs transportation resources that would allow her to use her motorized scooter to appointments, [place] options [place]" Third unsuccessful outreach to patient today.  Left voicemail message. Unsuccessful Outreach Letter mailed on 01/13/19.Will close social work case if no response by 01/26/19.  Ronn Melena, BSW Social Worker [phone]

## 2019-01-22 ENCOUNTER — Other Ambulatory Visit: Payer: Self-pay

## 2019-01-22 NOTE — Patient Outreach (Signed)
[place] [facility]) Care Management  01/22/2019  ERIELLA NUCKOLLS January 22, 1941 [id number]   Received return call from patient regarding social work referral for transportation resources. Original referral stated"Patient needs transportation resources that would allow her to use her motorized scooter to appointments, [place] options [place]" Patient reported today that she is able to drive herself to appointments but has difficulty getting her walker into and out of her vehicle.  Her best friend used to attend appointments with her but recently passed away.   Unfortunately, patient is out of [facility] service area.   She said it would be helpful to have an aide to assist her when she has medical appointments but this is not an affordable option for her.  She is also not able to afford the cost of hiring a private transportation agency.  Patient was appreciative for call but stated that she assumed there would not be a resource for this particular type of assistance.  Patient did say that she recently started calling provider office prior to appointments to inquire about assistance when she arrives.  She said "that is working out pretty good so far" Social work case being closed due to lack of Teacher, adult education.   Ronn Melena, BSW Social Worker [phone]

## 2019-02-01 ENCOUNTER — Ambulatory Visit: Payer: Self-pay | Admitting: Pharmacist

## 2019-02-01 ENCOUNTER — Other Ambulatory Visit: Payer: Self-pay | Admitting: Pharmacist

## 2019-02-01 NOTE — Patient Outreach (Signed)
[place] [facility]) Care Management  [place] 02/01/2019  [name] 1940/12/22 [id number]  Reason for call: medication management  Outreach:  Unsuccessful telephone call attempt #1 to patient. HIPAA compliant voicemail left requesting a return call  Plan:  -I will make another outreach attempt to patient within 3-4 business days.    Ralene Bathe, PharmD, [place] [phone]

## 2019-02-03 ENCOUNTER — Ambulatory Visit: Payer: Self-pay | Admitting: Pharmacist

## 2019-02-03 ENCOUNTER — Other Ambulatory Visit: Payer: Self-pay | Admitting: Pharmacist

## 2019-02-03 NOTE — Patient Outreach (Signed)
[place] [facility]) Care Management  [place]  02/03/2019  LAQUANDA FOTI 1940/08/22 [id number]  Reason for call: f/u on CBGs / statin  Outreach:  Unsuccessful telephone call attempt #2 to patient.   HIPAA compliant voicemail left requesting a return call  Plan:  -I will make another outreach attempt to patient within 3-4 business days.    Ralene Bathe, PharmD, [place] [phone]

## 2019-02-05 ENCOUNTER — Ambulatory Visit: Payer: Self-pay | Admitting: *Deleted

## 2019-02-08 ENCOUNTER — Other Ambulatory Visit: Payer: Self-pay | Admitting: *Deleted

## 2019-02-08 ENCOUNTER — Encounter: Payer: Self-pay | Admitting: *Deleted

## 2019-02-08 NOTE — Patient Outreach (Signed)
[place] [facility]) Care Management  02/08/2019  [name] [birth year] [id number]    RN Health Coach Monthly Outreach  Referral Date:  01/05/2019 Referral Source:  EMMI Prevent Reason for Referral:  Disease Management Education Insurance:  Medicare   Outreach Attempt:  Successful telephone outreach to patient for follow up.  HIPAA verified with patient.  Patient reporting she is having worsening of her chronic diarrhea.  Stating her bowel movements are more frequently, having to take at least 3 imodium pills a day and now with episodes of abdominal pain.  Encouraged patient to contact primary care provider with concerns, states she has sent email to primary care and is awaiting response back.  Continues to report ?muscle pain while off her Plaquenil for a few weeks, but continues to take her Lipitor.  Encouraged to discuss with primary care provider and [facility] Pharmacist.  Patient has now began to monitor blood sugars at least daily.  Fasting blood sugars have ranged 110-245.  States she often eats only one meal (dinner) with snacks throughout the night. Discussed and encouraged well balanced diabetic meal and drink options at least 3 times a day.     Appointments:  Attended appointment with primary care provider on 01/18/2019 and states she follows up with provider in 4 months (March 2021).  Plan: RN Health Coach will make next telephone outreach to patient within the month of December.  [place] [phone] Dempsey [email or web address]

## 2019-02-11 ENCOUNTER — Other Ambulatory Visit: Payer: Self-pay | Admitting: Pharmacist

## 2019-02-11 ENCOUNTER — Ambulatory Visit: Payer: Self-pay | Admitting: Pharmacist

## 2019-02-11 NOTE — Patient Outreach (Signed)
[place] [facility]) Care Management  [place] 02/11/2019  [name] 01-08-1941 [id number]  Reason for call: f/u on statin  Patient reports she stopped atorvastatin x 2-3 weeks after discussion with PCP.  She reports pain initially improved but then then returned to previous level of muscle pain and soreness all over her body.  She remains off of statin and is waiting to hear back from PCP on further recommendations.  She left message with PCP on 11/30.    We reviewed again possible alternative dosing strategies if she is resumed on atorvastatin and pain worsens further.  Patient is going to contact PCP office again today.  She denies having any other medication questions or concerns.  I provided my contact information if she would like to reach out to me again in the future.   Plan: -Will close [facility] pharmacy case  -Am happy to assist in the future as needed  Ralene Bathe, PharmD, [place] [phone]

## 2019-03-09 ENCOUNTER — Other Ambulatory Visit: Payer: Self-pay | Admitting: *Deleted

## 2019-03-09 NOTE — Patient Outreach (Signed)
[place]) Care Management  03/09/2019  [name] March 18, 1940 [id number]   RN Health Coach Monthly Outreach  Referral Date: 01/05/2019 Referral Source: EMMI Prevent Reason for Referral: Disease Management Education Insurance: Medicare   Outreach Attempt: Outreach attempt #1 to patient for follow up.  Patient answered and stated she is not available to speak at this time.  Requested telephone call back another day.    Plan:  RN Health Coach will make another outreach attempt within the month of December per patient's request.  Hubert Azure RN [place] [phone] Bronislaus [email or web address]

## 2019-03-10 ENCOUNTER — Other Ambulatory Visit: Payer: Self-pay | Admitting: *Deleted

## 2019-03-10 ENCOUNTER — Encounter: Payer: Self-pay | Admitting: *Deleted

## 2019-03-10 NOTE — Patient Outreach (Addendum)
[place] [facility]) Care Management  03/10/2019  LAKAYLA MCALPIN 06/17/1940 [id number]   RN Health Coach Monthly Outreach  Referral Date: 01/05/2019 Referral Source: EMMI Prevent Reason for Referral: Disease Management Education Insurance: Medicare   Outreach Attempt:  Successful telephone outreach to patient.  HIPAA verified with patient.  Patient reporting she is doing fine.  States her diarrhea is much better with the initiation of Creon tablets.  Has not checked blood sugar this morning but reporting fasting ranges of 104-145.  Reporting compliance with insulin and Metformin.  Appointments:  States she had Telehealth appointment with primary care provider in the last 2 months and has upcoming in person appointment in March 2021.  Plan: RN Health Coach will resend patient [facility]. RN Health Coach will resend patient 2021 Calendar Booklet. RN Health Coach will resend patient Living Well with Diabetes Educational Packet. RN Health Coach will resend patient EMMI Diabetes: Insulin Basics. RN Health Coach will make next telephone outreach to patient within the month of March and patient is agreeable to future outreach.  [place] [phone] [email or web address]

## 2019-04-14 ENCOUNTER — Telehealth: Payer: Self-pay | Admitting: Allergy and Immunology

## 2019-04-14 NOTE — Telephone Encounter (Signed)
[name] called in and states she has COPD, Asthma, Diabetes, and RA.  She received the first dose of COVID vaccine and for the next two nights after receiving the vaccine she started wheezing "really bad" and couldn't sleep so her PCP put her on a "prednisone blast" and that eventually had gotten her wheezing under control.  She would like Dr. Bruna Potter opinion on whether she should receive the 2nd dose of COVID vaccine since she reacted after the first dose?  I did inform [name] that it's been a little over 2 years since she was seen and I wasn't sure if Dr. Neldon Mc would want to see her.  Please advise.

## 2019-04-14 NOTE — Telephone Encounter (Signed)
I recommend that she be seen by one of [place] before her next injection to make sure she is in great shape prior to the injection. Unfortunately, using the prednisone may have prevented the vaccine from working adequately.

## 2019-04-14 NOTE — Telephone Encounter (Signed)
Attempted to contact patient but line was busy. Called cellphone and left message on machine.

## 2019-04-22 NOTE — Telephone Encounter (Signed)
Patient did not answer phone. Left another message for patient to return call to office.

## 2019-04-22 NOTE — Telephone Encounter (Signed)
Follow up on this please

## 2019-04-29 ENCOUNTER — Ambulatory Visit: Payer: Medicare Other | Admitting: Sports Medicine

## 2019-04-30 ENCOUNTER — Ambulatory Visit: Payer: Medicare Other | Admitting: Sports Medicine

## 2019-05-03 NOTE — Telephone Encounter (Signed)
Attempted to contact patient again with no success. Left a message and will send a letter requesting that the patient return call to office.

## 2019-05-04 NOTE — Telephone Encounter (Signed)
Her current numbers are : [phone] [facility]) [phone] [facility])  Maybe try her mobile.

## 2019-05-05 ENCOUNTER — Other Ambulatory Visit: Payer: Self-pay

## 2019-05-05 ENCOUNTER — Encounter: Payer: Self-pay | Admitting: Sports Medicine

## 2019-05-05 ENCOUNTER — Ambulatory Visit (INDEPENDENT_AMBULATORY_CARE_PROVIDER_SITE_OTHER): Payer: Medicare Other | Admitting: Sports Medicine

## 2019-05-05 DIAGNOSIS — M79675 Pain in left toe(s): Secondary | ICD-10-CM

## 2019-05-05 DIAGNOSIS — M79674 Pain in right toe(s): Secondary | ICD-10-CM

## 2019-05-05 DIAGNOSIS — B351 Tinea unguium: Secondary | ICD-10-CM

## 2019-05-05 DIAGNOSIS — E1142 Type 2 diabetes mellitus with diabetic polyneuropathy: Secondary | ICD-10-CM | POA: Diagnosis not present

## 2019-05-05 DIAGNOSIS — M21961 Unspecified acquired deformity of right lower leg: Secondary | ICD-10-CM

## 2019-05-05 NOTE — Progress Notes (Signed)
Subjective: [name] is a 79 y.o. female patient with history of diabetes who returns to office today complaining of long, painful nails; unable to trim. Patient states that the glucose was 135, last PCP visit has been a while has been trying to avoid going out due to Covid but estimates that it was 3 months ago. Patient denies any other issues at this time.   Patient Active Problem List   Diagnosis Date Noted  . Hearing loss, sensorineural, 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  . Tinnitus, left 01/08/2019  . DM type 2, not at goal [facility]) 06/03/2018  . Frequent infections 05/21/2018  . Gangrene ([place]) 05/21/2018  . Pain in finger 05/07/2018  . Family history of aneurysm 05/07/2018  . Diabetes mellitus due to underlying condition, controlled, with complication, without long-term current use of insulin ([place])   . Lower urinary tract infectious disease   . Palpitations 01/17/2018  . Dehydration 01/17/2018  . Hypotension 01/17/2018  . Symptom of blood in stool 01/17/2018  . Diarrhea in adult patient 01/17/2018  . 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  . 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 Closed displaced transverse fracture of right patella 05/06/2017  . Anemia 04/16/2017  . Chronic interstitial lung disease ([place]) 04/16/2017  . Chronic kidney disease (CKD), stage III (moderate) 04/16/2017  . Other hyperlipidemia 04/16/2017  . Type 2 diabetes mellitus without complication, without long-term current use of insulin ([place]) 04/16/2017  . Pain in right knee 03/25/2017  . Arthritis, septic, knee ([place]) 03/09/2017  . 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 Fibromyalgia 06/04/2016  . High risk medication use 06/04/2016  . Claw toe, acquired, left 01/29/2016  . Rheumatoid arthritis involving both feet with negative rheumatoid factor ([place]) 01/29/2016  . Septic prepatellar bursitis  of right knee 10/26/2014  . Prepatellar bursitis of right knee 08/28/2014  . DYSPHAGIA ORAL PHASE 06/18/2007  . Asthma 05/14/2007  . SLEEP APNEA 05/14/2007  . Sleep apnea 05/14/2007  . GERD 05/13/2007  . Rheumatoid arthritis ([place]) 05/13/2007  . OSTEOPOROSIS 05/13/2007  . DIARRHEA, CHRONIC 05/13/2007   Current Outpatient Medications on File Prior to Visit  Medication Sig Dispense Refill  . atorvastatin (LIPITOR) 40 MG tablet Take 40 mg by mouth daily at 6 PM.     . cetirizine (ZYRTEC) 10 MG tablet Take 10 mg by mouth daily.    . cholecalciferol (VITAMIN D) 1000 UNITS tablet Take 1,000 Units by mouth at bedtime.     . Cyanocobalamin (B-12) 2500 MCG TABS Take 2,500 mcg by mouth at bedtime.     . DULoxetine (CYMBALTA) 60 MG capsule Take 60 mg by mouth at bedtime.     Marland Kitchen ezetimibe (ZETIA) 10 MG tablet Take 10 mg by mouth at bedtime.     . ferrous sulfate 325 (65 FE) MG tablet Take 325 mg by mouth at bedtime.     . gabapentin (NEURONTIN) 100 MG capsule     . hydroxychloroquine (PLAQUENIL) 200 MG tablet Take 400 mg by mouth daily.     . insulin NPH-regular Human (70-30) 100 UNIT/ML injection Inject 14 Units into the skin 2 (two) times daily with a meal.    . lipase/protease/amylase (CREON) 12000 units CPEP capsule Take 24,000 Units by mouth 3 (three) times daily with meals.    . metFORMIN (GLUCOPHAGE) 500 MG tablet Take 1,000 mg by mouth at bedtime.     Marland Kitchen morphine (MSIR) 30 MG tablet Take 30  mg by mouth daily as needed for severe pain.    . Multiple Vitamin (MULTIVITAMIN WITH MINERALS) TABS tablet Take 1 tablet by mouth at bedtime.    . Omega-3 Fatty Acids (FISH OIL) 1200 MG CAPS Take 1,200 mg by mouth at bedtime.     [name]     . pantoprazole (PROTONIX) 40 MG tablet Take one tablet once every morning (Patient taking differently: 80 mg daily. Take one tablet once every morning) 30 tablet 5  . predniSONE (DELTASONE) 5 MG tablet Take 5 mg by mouth daily with breakfast.       No current facility-administered medications on file prior to visit.   Allergies  Allergen Reactions  . Penicillins Hives    Has patient had a PCN reaction causing immediate rash, facial/tongue/throat swelling, SOB or lightheadedness with hypotension: Yes Has patient had a PCN reaction causing severe rash involving mucus membranes or skin necrosis: No Has patient had a PCN reaction that required hospitalization: No Has patient had a PCN reaction occurring within the last 10 years: No If all of the above answers are "NO", then may proceed with Cephalosporin use. Patient has tolerated ceftriaxone in the recent past.  . Sulfamethoxazole-Trimethoprim Nausea And Vomiting    REACTION: stomach problems  . Meperidine Other (See Comments)  . Sulfa Antibiotics Nausea And Vomiting    REACTION: stomach problems  . Sulfonamide Derivatives Nausea And Vomiting    REACTION: stomach problems    Objective: General: Patient is awake, alert, and oriented x 3 and in no acute distress in wheelchair   Integument: Skin is warm, dry and supple bilateral. Nails are tender, long, thickened and dystrophic with subungual debris, consistent with onychomycosis, 1-5 bilateral. No signs of infection.   Vasculature:  Dorsalis Pedis pulse 1/4 bilateral. Posterior Tibial pulse  0/4 bilateral.  Capillary fill time <3 sec 1-5 bilateral. No hair growth to the level of the digits. Temperature gradient within normal limits. + varicosities present bilateral. Trace edema present bilateral.   Neurology: The patient has diminished sensation measured with a 5.07/10g Semmes Weinstein Monofilament at all pedal sites bilateral . Vibratory sensation diminished bilateral with tuning fork. No Babinski sign present bilateral.   Musculoskeletal: Severely dislocated lesser metatarsophalangeal joints and digital deformities secondary to rheumatoid arthritis, there is significant pes planus deformity with plantar bony prominences  specifically at the navicular of the left foot. Muscular strength 4/5 in all lower extremity muscular groups bilateral without pain on range of motion . No tenderness with calf compression bilateral.  Assessment and Plan: Problem List Items Addressed This Visit    None    Visit Diagnoses    Pain due to onychomycosis of toenails of both feet    -  Primary   Diabetic polyneuropathy associated with type 2 diabetes mellitus (HCC)       Relevant Medications   gabapentin (NEURONTIN) 100 MG capsule   Foot deformity, bilateral         -Examined patient. -Discussed and educated patient on diabetic foot care, especially with  regards to the vascular, neurological and musculoskeletal systems.  -Mechanically debrided all nails 1-5 bilateral using sterile nail nipper and filed with dremel without incident. -Advised patient to sleep with blanket and wear socks to prevent feet from getting cold -Answered all patient questions -Patient to return  in 2.5 months for at risk foot care -Patient advised to call the office if any problems or questions arise in the meantime.  Landis Martins, DPM

## 2019-05-05 NOTE — Telephone Encounter (Signed)
Patient was seen at [facility] this morning, but no changes were made to her numbers. I called both available numbers, left messages on both again. I also found her son listed as a emergency contact so I called both his house, and mobile number and received no answer. I left a message on his cell phone voicemail requesting that he contact our office in regards to his mother. Was unable to leave a message on his home phone due to the mail box being full.

## 2019-05-18 DIAGNOSIS — D6489 Other specified anemias: Secondary | ICD-10-CM | POA: Diagnosis not present

## 2019-05-18 DIAGNOSIS — Z7952 Long term (current) use of systemic steroids: Secondary | ICD-10-CM | POA: Diagnosis not present

## 2019-05-18 DIAGNOSIS — R0609 Other forms of dyspnea: Secondary | ICD-10-CM | POA: Diagnosis not present

## 2019-05-18 DIAGNOSIS — R413 Other amnesia: Secondary | ICD-10-CM | POA: Diagnosis not present

## 2019-05-18 DIAGNOSIS — N183 Chronic kidney disease, stage 3 unspecified: Secondary | ICD-10-CM | POA: Diagnosis not present

## 2019-05-18 DIAGNOSIS — M5136 Other intervertebral disc degeneration, lumbar region: Secondary | ICD-10-CM | POA: Diagnosis not present

## 2019-05-18 DIAGNOSIS — R269 Unspecified abnormalities of gait and mobility: Secondary | ICD-10-CM | POA: Diagnosis not present

## 2019-05-18 DIAGNOSIS — I700 Atherosclerosis of aorta: Secondary | ICD-10-CM | POA: Diagnosis not present

## 2019-05-18 DIAGNOSIS — Z1331 Encounter for screening for depression: Secondary | ICD-10-CM | POA: Diagnosis not present

## 2019-05-18 DIAGNOSIS — E1151 Type 2 diabetes mellitus with diabetic peripheral angiopathy without gangrene: Secondary | ICD-10-CM | POA: Diagnosis not present

## 2019-05-18 DIAGNOSIS — D649 Anemia, unspecified: Secondary | ICD-10-CM | POA: Diagnosis not present

## 2019-05-18 DIAGNOSIS — K8681 Exocrine pancreatic insufficiency: Secondary | ICD-10-CM | POA: Diagnosis not present

## 2019-05-18 DIAGNOSIS — J849 Interstitial pulmonary disease, unspecified: Secondary | ICD-10-CM | POA: Diagnosis not present

## 2019-05-18 DIAGNOSIS — Z79899 Other long term (current) drug therapy: Secondary | ICD-10-CM | POA: Diagnosis not present

## 2019-05-18 DIAGNOSIS — Z23 Encounter for immunization: Secondary | ICD-10-CM | POA: Diagnosis not present

## 2019-05-18 DIAGNOSIS — M810 Age-related osteoporosis without current pathological fracture: Secondary | ICD-10-CM | POA: Diagnosis not present

## 2019-05-18 DIAGNOSIS — R7989 Other specified abnormal findings of blood chemistry: Secondary | ICD-10-CM | POA: Diagnosis not present

## 2019-05-18 DIAGNOSIS — D8989 Other specified disorders involving the immune mechanism, not elsewhere classified: Secondary | ICD-10-CM | POA: Diagnosis not present

## 2019-05-19 DIAGNOSIS — R82998 Other abnormal findings in urine: Secondary | ICD-10-CM | POA: Diagnosis not present

## 2019-05-20 ENCOUNTER — Other Ambulatory Visit: Payer: Self-pay | Admitting: *Deleted

## 2019-05-20 NOTE — Patient Outreach (Signed)
[facility]) Care Management  05/20/2019  [name] 1941-02-03 [id number]   RN Health Coach Quarterly Outreach  Referral Date: 01/05/2019 Referral Source: EMMI Prevent Reason for Referral: Disease Management Education Insurance: Medicare   Outreach Attempt:  Outreach attempt #1 to patient for follow up.  Line busy and unable to leave voice message.  Plan:  RN Health Coach will make another outreach attempt within the month of April.  [name] [phone] [name]

## 2019-05-31 ENCOUNTER — Other Ambulatory Visit (HOSPITAL_COMMUNITY): Payer: Self-pay | Admitting: *Deleted

## 2019-06-01 ENCOUNTER — Other Ambulatory Visit: Payer: Self-pay

## 2019-06-01 ENCOUNTER — Ambulatory Visit (HOSPITAL_COMMUNITY)
Admission: RE | Admit: 2019-06-01 | Discharge: 2019-06-01 | Disposition: A | Discharge status: Home / Self Care | Payer: Medicare Other | Source: Ambulatory Visit | Attending: Internal Medicine | Admitting: Internal Medicine

## 2019-06-01 DIAGNOSIS — M810 Age-related osteoporosis without current pathological fracture: Secondary | ICD-10-CM | POA: Insufficient documentation

## 2019-06-01 MED ORDER — DENOSUMAB 60 MG/ML ~~LOC~~ SOSY: 60.0000 mg | PREFILLED_SYRINGE | Freq: Once | SUBCUTANEOUS | Status: DC

## 2019-06-01 MED ORDER — DENOSUMAB 60 MG/ML ~~LOC~~ SOSY
PREFILLED_SYRINGE | SUBCUTANEOUS | Status: AC
  Administered 2019-06-01: 60 mg
  Filled 2019-06-01: qty 1

## 2019-06-17 DIAGNOSIS — M25561 Pain in right knee: Secondary | ICD-10-CM | POA: Diagnosis not present

## 2019-06-17 DIAGNOSIS — Z96651 Presence of right artificial knee joint: Secondary | ICD-10-CM | POA: Diagnosis not present

## 2019-06-17 DIAGNOSIS — Z96652 Presence of left artificial knee joint: Secondary | ICD-10-CM | POA: Diagnosis not present

## 2019-06-17 DIAGNOSIS — M25661 Stiffness of right knee, not elsewhere classified: Secondary | ICD-10-CM | POA: Diagnosis not present

## 2019-06-21 ENCOUNTER — Other Ambulatory Visit: Payer: Self-pay | Admitting: *Deleted

## 2019-06-21 NOTE — Patient Outreach (Signed)
[place] [facility]) Care Management  06/21/2019  EVANGELYNE LACEWELL 1940-07-20 [id number]   RN Health Coach Quarterly Outreach  Referral Date: 01/05/2019 Referral Source: EMMI Prevent Reason for Referral: Disease Management Education Insurance: Medicare   Outreach Attempt:  Outreach attempt #2 to patient for follow up. No answer. RN Health Coach left HIPAA compliant voicemail message along with contact information.  Plan:  RN Health Coach will make another outreach attempt within the month of May if no return call back from patient.  [place] [phone] Vernecia [email or web address]

## 2019-06-22 DIAGNOSIS — M25661 Stiffness of right knee, not elsewhere classified: Secondary | ICD-10-CM | POA: Diagnosis not present

## 2019-06-22 DIAGNOSIS — M25561 Pain in right knee: Secondary | ICD-10-CM | POA: Diagnosis not present

## 2019-06-22 DIAGNOSIS — Z96651 Presence of right artificial knee joint: Secondary | ICD-10-CM | POA: Diagnosis not present

## 2019-06-22 DIAGNOSIS — Z96652 Presence of left artificial knee joint: Secondary | ICD-10-CM | POA: Diagnosis not present

## 2019-06-25 DIAGNOSIS — M25561 Pain in right knee: Secondary | ICD-10-CM | POA: Diagnosis not present

## 2019-06-25 DIAGNOSIS — Z96652 Presence of left artificial knee joint: Secondary | ICD-10-CM | POA: Diagnosis not present

## 2019-06-25 DIAGNOSIS — M25661 Stiffness of right knee, not elsewhere classified: Secondary | ICD-10-CM | POA: Diagnosis not present

## 2019-06-25 DIAGNOSIS — Z96651 Presence of right artificial knee joint: Secondary | ICD-10-CM | POA: Diagnosis not present

## 2019-06-28 DIAGNOSIS — Z96652 Presence of left artificial knee joint: Secondary | ICD-10-CM | POA: Diagnosis not present

## 2019-06-28 DIAGNOSIS — Z96651 Presence of right artificial knee joint: Secondary | ICD-10-CM | POA: Diagnosis not present

## 2019-06-28 DIAGNOSIS — M25661 Stiffness of right knee, not elsewhere classified: Secondary | ICD-10-CM | POA: Diagnosis not present

## 2019-06-28 DIAGNOSIS — M25561 Pain in right knee: Secondary | ICD-10-CM | POA: Diagnosis not present

## 2019-07-01 DIAGNOSIS — M25561 Pain in right knee: Secondary | ICD-10-CM | POA: Diagnosis not present

## 2019-07-01 DIAGNOSIS — Z96652 Presence of left artificial knee joint: Secondary | ICD-10-CM | POA: Diagnosis not present

## 2019-07-01 DIAGNOSIS — M25661 Stiffness of right knee, not elsewhere classified: Secondary | ICD-10-CM | POA: Diagnosis not present

## 2019-07-01 DIAGNOSIS — Z96651 Presence of right artificial knee joint: Secondary | ICD-10-CM | POA: Diagnosis not present

## 2019-07-12 DIAGNOSIS — M25661 Stiffness of right knee, not elsewhere classified: Secondary | ICD-10-CM | POA: Diagnosis not present

## 2019-07-12 DIAGNOSIS — Z96651 Presence of right artificial knee joint: Secondary | ICD-10-CM | POA: Diagnosis not present

## 2019-07-12 DIAGNOSIS — Z96652 Presence of left artificial knee joint: Secondary | ICD-10-CM | POA: Diagnosis not present

## 2019-07-12 DIAGNOSIS — M25561 Pain in right knee: Secondary | ICD-10-CM | POA: Diagnosis not present

## 2019-07-14 ENCOUNTER — Ambulatory Visit: Payer: Medicare Other | Admitting: Sports Medicine

## 2019-07-15 DIAGNOSIS — Z96652 Presence of left artificial knee joint: Secondary | ICD-10-CM | POA: Diagnosis not present

## 2019-07-15 DIAGNOSIS — M25561 Pain in right knee: Secondary | ICD-10-CM | POA: Diagnosis not present

## 2019-07-15 DIAGNOSIS — M25661 Stiffness of right knee, not elsewhere classified: Secondary | ICD-10-CM | POA: Diagnosis not present

## 2019-07-15 DIAGNOSIS — Z96651 Presence of right artificial knee joint: Secondary | ICD-10-CM | POA: Diagnosis not present

## 2019-07-19 DIAGNOSIS — M25661 Stiffness of right knee, not elsewhere classified: Secondary | ICD-10-CM | POA: Diagnosis not present

## 2019-07-19 DIAGNOSIS — M25561 Pain in right knee: Secondary | ICD-10-CM | POA: Diagnosis not present

## 2019-07-19 DIAGNOSIS — Z96652 Presence of left artificial knee joint: Secondary | ICD-10-CM | POA: Diagnosis not present

## 2019-07-19 DIAGNOSIS — Z96651 Presence of right artificial knee joint: Secondary | ICD-10-CM | POA: Diagnosis not present

## 2019-07-22 DIAGNOSIS — Z96652 Presence of left artificial knee joint: Secondary | ICD-10-CM | POA: Diagnosis not present

## 2019-07-22 DIAGNOSIS — M25561 Pain in right knee: Secondary | ICD-10-CM | POA: Diagnosis not present

## 2019-07-22 DIAGNOSIS — Z96651 Presence of right artificial knee joint: Secondary | ICD-10-CM | POA: Diagnosis not present

## 2019-07-22 DIAGNOSIS — M25661 Stiffness of right knee, not elsewhere classified: Secondary | ICD-10-CM | POA: Diagnosis not present

## 2019-07-23 ENCOUNTER — Encounter: Payer: Self-pay | Admitting: *Deleted

## 2019-07-23 ENCOUNTER — Other Ambulatory Visit: Payer: Self-pay | Admitting: *Deleted

## 2019-07-23 NOTE — Patient Outreach (Signed)
[facility] Care Management  07/23/2019  JOVANNAH HANNEGAN 05-Aug-1940 [id number]   RN Health CoachQuarterlyOutreach  Referral Date: 01/05/2019 Referral Source: EMMI Prevent Reason for Referral: Disease Management Education Insurance: Medicare   Outreach Attempt:  Outreach attempt #3 to patient for follow up. No answer. RN Health Coach left HIPAA compliant voicemail message along with contact information.  Plan:  RN Health Coach will make another outreach attempt within the month of June if no return call back from patient.    [place] [phone] Briyah [email or web address]

## 2019-07-23 NOTE — Patient Outreach (Signed)
[place] [facility]) Care Management  [place]  07/23/2019   [name] 1940-12-21 [id number]   RN Health Coach Quarterly Outreach   Referral Date:  01/05/2019 Referral Source:  EMMI Prevent Reason for Referral:  Disease Management Education Insurance:  Medicare   Outreach Attempt:  Received telephone call back from patient.  Patient verified HIPAA.  Reports she is doing fair and has received both COVID vaccine doses.  Does report a respiratory wheeze that she uses Albuterol inhaler.  Has been monitoring blood sugars, but checking a lot after eating.  Blood sugar this morning was 246 after drinking coffee and eating pumpkin muffin/cupcake.  Also states because she awakens in the mornings late and eats later, she sometimes only takes one dose of insulin.  Latest Hgb A1C was 7.3 on 05/18/2019.  Patient also reports being depressed months ago, and taking Wellbutrin for about one month and then stopping the medication (forgot to take and once she realized was fine and never restarted).  States she feels better now but struggles with trying to stay in a positive head space.  Denies any plans to harm self.  [facility] Social Work referral discussed for depression resources and patient verbally agrees.  Encounter Medications:  Outpatient Encounter Medications as of 07/23/2019  Medication Sig Note  . albuterol (VENTOLIN HFA) 108 (90 Base) MCG/ACT inhaler    . atorvastatin (LIPITOR) 40 MG tablet Take 40 mg by mouth daily at 6 PM.  03/10/2019: Reports taking 20 mg daily  . cetirizine (ZYRTEC) 10 MG tablet Take 10 mg by mouth daily. 07/23/2019: Reports taking as needed  . cholecalciferol (VITAMIN D) 1000 UNITS tablet Take 1,000 Units by mouth at bedtime.    . Cyanocobalamin (B-12) 2500 MCG TABS Take 2,500 mcg by mouth at bedtime.    . DULoxetine (CYMBALTA) 60 MG capsule Take 60 mg by mouth at bedtime.    Marland Kitchen ezetimibe (ZETIA) 10 MG tablet Take 10 mg by mouth at bedtime.    . ferrous sulfate  325 (65 FE) MG tablet Take 325 mg by mouth at bedtime.    . insulin NPH-regular Human (70-30) 100 UNIT/ML injection Inject 14 Units into the skin 2 (two) times daily with a meal.   . metFORMIN (GLUCOPHAGE) 500 MG tablet Take 1,000 mg by mouth at bedtime.    Marland Kitchen morphine (MSIR) 30 MG tablet Take 30 mg by mouth daily as needed for severe pain.   . Multiple Vitamin (MULTIVITAMIN WITH MINERALS) TABS tablet Take 1 tablet by mouth at bedtime.   . Omega-3 Fatty Acids (FISH OIL) 1200 MG CAPS Take 1,200 mg by mouth at bedtime.    [name]    . pantoprazole (PROTONIX) 40 MG tablet Take one tablet once every morning (Patient taking differently: 80 mg daily. Take one tablet once every morning)   . predniSONE (DELTASONE) 5 MG tablet Take 5 mg by mouth daily with breakfast.  01/08/2019: Reports taking 5 mg daily, but does increase with arthritis attacks  . gabapentin (NEURONTIN) 100 MG capsule  07/23/2019: Reports not taking  . hydroxychloroquine (PLAQUENIL) 200 MG tablet Take 400 mg by mouth daily.  02/08/2019: Currently not taking per provider instructions  . lipase/protease/amylase (CREON) 12000 units CPEP capsule Take 24,000 Units by mouth 3 (three) times daily with meals. 07/23/2019: Reports not taking   No facility-administered encounter medications on file as of 07/23/2019.    Functional Status:  In your present state of health, do you have any  difficulty performing the following activities: 01/08/2019  Hearing? Y  Comment left ear hearing loss but no hearing aid  Vision? N  Difficulty concentrating or making decisions? N  Walking or climbing stairs? Y  Comment only walk short distances, cannot climb stairs  Dressing or bathing? N  Doing errands, shopping? Y  Comment difficulty with errands due to needing motorized chair  Preparing Food and eating ? N  Using the Toilet? N  In the past six months, have you accidently leaked urine? N  Comment occasional incontinence issues  Do you  have problems with loss of bowel control? N  Managing your Medications? N  Managing your Finances? N  Housekeeping or managing your Housekeeping? Y  Comment difficulties with house cleaning  Some recent data might be hidden    Fall/Depression Screening: Fall Risk  07/23/2019 02/08/2019 01/08/2019  Falls in the past year? _0 Number falls in past yr: 0 0 0  Injury with Fall? 0 0 0  Risk for fall due to : History of fall(s);Impaired balance/gait;Impaired mobility;Impaired vision;Medication side effect History of fall(s);Medication side effect;Impaired balance/gait History of fall(s);Medication side effect;Impaired balance/gait;Impaired mobility;Impaired vision;Orthopedic patient  Follow up Falls evaluation completed;Education provided;Falls prevention discussed Falls evaluation completed;Education provided;Falls prevention discussed Falls evaluation completed;Education provided;Falls prevention discussed   PHQ 2/9 Scores 07/23/2019 01/08/2019 04/08/2017  PHQ - 2 Score 0 0 0   THN CM Care Plan Problem One     Most Recent Value  Care Plan Problem One  Knowledge deficiet related to self care management of diabetes.  Role Documenting the Problem One  [place] for Problem One  Active  THN Long Term Goal   Patient will maintain Hgb A1C of 7.5 or below within the next 90 days. (7.3  05/18/19)  THN Long Term Goal Start Date  01/08/19  THN Long Term Goal Met Date  07/23/19  Interventions for Problem One Long Term Goal  Congratulated patient on current Hgb A1C of 7.3 close to goal, reviewed current blood sugar ranges and discussed checking blood sugar prior to meals, reviewed medications and encouraged medication compliance, care plan and goals reviewed and discussed, [facility] Social Work referral for depression resources, encouraged healthier food and drink options, encouraged to keep and attend scheduled medical appointments, reviewed currrent COVID guidelines and lifted restrictions and  encouraged patient to utilize mask at her comfort level with revieving both vaccine doses  THN CM Short Term Goal #1   Patient will report keeping log of daily blood sugars along with dosage of insulin for the day for the next 30 days to review with provider.  THN CM Short Term Goal #1 Start Date  03/10/19  [facility] CM Short Term Goal #1 Met Date  07/23/19     Appointments:  Attended appointment with primary care provider, Dr. Joylene Draft on 05/18/2019.  Plan: RN Health Coach will place [facility] Social Worker referral to assist with depression resources. RN Health Coach will send primary care provider quarterly update. RN Health Coach will make next telephone outreach to patient within the month of July and patient agrees to future outreach.  [place] [phone] Brandin [email or web address]

## 2019-07-26 ENCOUNTER — Encounter: Payer: Self-pay | Admitting: *Deleted

## 2019-07-26 ENCOUNTER — Other Ambulatory Visit: Payer: Medicare Other | Admitting: *Deleted

## 2019-07-26 NOTE — Patient Outreach (Signed)
[facility]) Care Management  07/26/2019  [name] 08-Jun-1940 [id number]   CSW made an attempt to try and contact patient today to perform the initial phone assessment, as well as assess and assist with social work needs and services, without success.  A HIPAA compliant message was left for patient on voicemail.  CSW is currently awaiting a return call.  CSW will make a second outreach attempt within the next 3-4 business days, if a return call is not received from patient in the meantime.  CSW will also mail an Outreach Letter to patient's home requesting that patient contact CSW if patient is interested in receiving social work services through [place] with Scientist, clinical (histocompatibility and immunogenetics).  Referral received from Hubert Azure, [place] with [facility], to provide counseling and supportive services to patient for symptoms of depression, as well as resource information.    Nat Christen, BSW, MSW, LCSW  Licensed Education officer, [facility]  Mailing [place] [address], [place], [place] [address] Physical Address-[address], [place], [place] [address] Toll Free Main # [phone] Fax # [phone] Cell # [phone]  Office # [phone] [email or web address]

## 2019-07-27 ENCOUNTER — Ambulatory Visit: Payer: Medicare Other | Admitting: *Deleted

## 2019-07-29 ENCOUNTER — Other Ambulatory Visit: Payer: Self-pay | Admitting: *Deleted

## 2019-07-29 DIAGNOSIS — M25561 Pain in right knee: Secondary | ICD-10-CM | POA: Diagnosis not present

## 2019-07-29 DIAGNOSIS — Z96652 Presence of left artificial knee joint: Secondary | ICD-10-CM | POA: Diagnosis not present

## 2019-07-29 DIAGNOSIS — M25661 Stiffness of right knee, not elsewhere classified: Secondary | ICD-10-CM | POA: Diagnosis not present

## 2019-07-29 DIAGNOSIS — Z96651 Presence of right artificial knee joint: Secondary | ICD-10-CM | POA: Diagnosis not present

## 2019-07-29 NOTE — Patient Outreach (Signed)
[facility]) Care Management  07/29/2019  CLOIS COLA 1940-08-29 [id number]   CSW made a second attempt to try and contact patient today to perform phone assessment, as well as assess and assist with social work needs and services, without success.  A HIPAA compliant message was left for patient on voicemail.  CSW continues to await a return call.  CSW will make a third outreach attempt within the next 3-4 business days, if a return call is not received from patient in the meantime.    Nat Christen, BSW, MSW, LCSW  Licensed Education officer, environmental Health System  Mailing [place] [address] [place], [place] [address] Physical Address-[address], [place], [place] [address] Toll Free Main # [phone] Fax # [phone] Cell # [phone]  Office # [phone] [email or web address]

## 2019-08-02 DIAGNOSIS — M25661 Stiffness of right knee, not elsewhere classified: Secondary | ICD-10-CM | POA: Diagnosis not present

## 2019-08-02 DIAGNOSIS — M25561 Pain in right knee: Secondary | ICD-10-CM | POA: Diagnosis not present

## 2019-08-02 DIAGNOSIS — Z96652 Presence of left artificial knee joint: Secondary | ICD-10-CM | POA: Diagnosis not present

## 2019-08-02 DIAGNOSIS — Z96651 Presence of right artificial knee joint: Secondary | ICD-10-CM | POA: Diagnosis not present

## 2019-08-03 ENCOUNTER — Other Ambulatory Visit: Payer: Self-pay | Admitting: *Deleted

## 2019-08-03 NOTE — Patient Outreach (Addendum)
[facility]) Care Management  08/03/2019  [name] 1940-05-01 [id number]    CSW made a third attempt to try and contact patient today to perform the initial phone assessment, as well as assess and assist with social work needs and services, without success.  A HIPAA compliant message was left for patient on voicemail.  CSW is currently awaiting a return call.  CSW will make a fourth and final outreach attempt within the next four weeks, if a return call is not received from patient in the meantime.  CSW will then proceed with case closure, as required number of phone attempts have been made and an outreach letter was mailed to patient's home allowing 30 business days for a response, if patient was interested in receiving social work services through [place] with Scientist, clinical (histocompatibility and immunogenetics).  Nat Christen, BSW, MSW, LCSW  Licensed Education officer, environmental Health System  Mailing [place] [address], [place], [place] [address] Physical Address-[address], [place], [place] [address] Toll Free Main # [phone] Fax # [phone] Cell # [phone]  Office # [phone] [email or web address]

## 2019-08-06 ENCOUNTER — Ambulatory Visit: Payer: Medicare Other | Admitting: Sports Medicine

## 2019-08-12 ENCOUNTER — Encounter: Payer: Self-pay | Admitting: Podiatry

## 2019-08-12 ENCOUNTER — Other Ambulatory Visit: Payer: Self-pay

## 2019-08-12 ENCOUNTER — Ambulatory Visit (INDEPENDENT_AMBULATORY_CARE_PROVIDER_SITE_OTHER): Payer: Medicare Other | Admitting: Podiatry

## 2019-08-12 DIAGNOSIS — M79674 Pain in right toe(s): Secondary | ICD-10-CM | POA: Diagnosis not present

## 2019-08-12 DIAGNOSIS — B351 Tinea unguium: Secondary | ICD-10-CM | POA: Diagnosis not present

## 2019-08-12 DIAGNOSIS — M21961 Unspecified acquired deformity of right lower leg: Secondary | ICD-10-CM

## 2019-08-12 DIAGNOSIS — M79675 Pain in left toe(s): Secondary | ICD-10-CM

## 2019-08-12 DIAGNOSIS — E1142 Type 2 diabetes mellitus with diabetic polyneuropathy: Secondary | ICD-10-CM

## 2019-08-12 DIAGNOSIS — I739 Peripheral vascular disease, unspecified: Secondary | ICD-10-CM

## 2019-08-12 DIAGNOSIS — M069 Rheumatoid arthritis, unspecified: Secondary | ICD-10-CM

## 2019-08-12 DIAGNOSIS — M21962 Unspecified acquired deformity of left lower leg: Secondary | ICD-10-CM

## 2019-08-17 DIAGNOSIS — Z96652 Presence of left artificial knee joint: Secondary | ICD-10-CM | POA: Diagnosis not present

## 2019-08-17 DIAGNOSIS — M25561 Pain in right knee: Secondary | ICD-10-CM | POA: Diagnosis not present

## 2019-08-17 DIAGNOSIS — Z96651 Presence of right artificial knee joint: Secondary | ICD-10-CM | POA: Diagnosis not present

## 2019-08-17 DIAGNOSIS — M25661 Stiffness of right knee, not elsewhere classified: Secondary | ICD-10-CM | POA: Diagnosis not present

## 2019-08-18 NOTE — Progress Notes (Signed)
Subjective: [name] presents today at risk foot care. Pt has h/o NIDDM with chronic kidney disease and painful mycotic nails b/l that are difficult to trim. Pain interferes with ambulation. Aggravating factors include wearing enclosed shoe gear. Pain is relieved with periodic professional debridement.   Patient also has h/o RA and neuropathy symptoms. She states her toes are numb on occasion and they feel cold.  [name], MD is patient's PCP.  Past Medical History:  Diagnosis Date  . Asthma   . Complication of anesthesia    Anesthesia told her years ago she got too cold during surgery  . Diabetes mellitus without complication ([name])   . Emphysema   . Fibromyalgia   . Osteoporosis   . Rheumatoid arthritis(714.0)   . Sleep apnea    no cpap     Current Outpatient Medications on File Prior to Visit  Medication Sig Dispense Refill  . albuterol (VENTOLIN HFA) 108 (90 Base) MCG/ACT inhaler     . atorvastatin (LIPITOR) 40 MG tablet Take 40 mg by mouth daily at 6 PM.     . cholecalciferol (VITAMIN D) 1000 UNITS tablet Take 1,000 Units by mouth at bedtime.     . Cyanocobalamin (B-12) 2500 MCG TABS Take 2,500 mcg by mouth at bedtime.     . DULoxetine (CYMBALTA) 60 MG capsule Take 60 mg by mouth at bedtime.     Marland Kitchen ezetimibe (ZETIA) 10 MG tablet Take 10 mg by mouth at bedtime.     . ferrous sulfate 325 (65 FE) MG tablet Take 325 mg by mouth at bedtime.     . hydroxychloroquine (PLAQUENIL) 200 MG tablet Take 400 mg by mouth daily.     . insulin NPH-regular Human (70-30) 100 UNIT/ML injection Inject 14 Units into the skin 2 (two) times daily with a meal.    . lipase/protease/amylase (CREON) 12000 units CPEP capsule Take 24,000 Units by mouth 3 (three) times daily with meals.    . metFORMIN (GLUCOPHAGE) 500 MG tablet Take 1,000 mg by mouth at bedtime.     Marland Kitchen morphine (MSIR) 30 MG tablet Take 30 mg by mouth daily as needed for severe pain.    . Multiple Vitamin (MULTIVITAMIN WITH MINERALS)  TABS tablet Take 1 tablet by mouth at bedtime.    . Omega-3 Fatty Acids (FISH OIL) 1200 MG CAPS Take 1,200 mg by mouth at bedtime.     [name] test strip     . pantoprazole (PROTONIX) 40 MG tablet Take one tablet once every morning (Patient taking differently: 80 mg daily. Take one tablet once every morning) 30 tablet 5  . predniSONE (DELTASONE) 5 MG tablet Take 5 mg by mouth daily with breakfast.      No current facility-administered medications on file prior to visit.     Allergies  Allergen Reactions  . Penicillins Hives    Has patient had a PCN reaction causing immediate rash, facial/tongue/throat swelling, SOB or lightheadedness with hypotension: Yes Has patient had a PCN reaction causing severe rash involving mucus membranes or skin necrosis: No Has patient had a PCN reaction that required hospitalization: No Has patient had a PCN reaction occurring within the last 10 years: No If all of the above answers are "NO", then may proceed with Cephalosporin use. Patient has tolerated ceftriaxone in the recent past.  . Sulfamethoxazole-Trimethoprim Nausea And Vomiting    REACTION: stomach problems  . Meperidine Other (See Comments)  . Sulfa Antibiotics Nausea And Vomiting  REACTION: stomach problems  . Sulfonamide Derivatives Nausea And Vomiting    REACTION: stomach problems    Objective: [name] is a pleasant 79 y.o. y.o. Patient Race: White or Caucasian [1]  female in NAD. AAO x 3.  There were no vitals filed for this visit.  Vascular Examination: Neurovascular status unchanged b/l lower extremities. Capillary fill time to digits <3 seconds b/l lower extremities. Faintly palpable DP pulses b/l. Nonpalpable PT pulse(s) b/l lower extremities. Pedal hair absent b/l. Skin temperature gradient within normal limits b/l. Trace edema noted b/l feet. Varicosities present b/l.  Dermatological Examination: Pedal skin with normal turgor, texture and tone bilaterally. No open  wounds bilaterally. No interdigital macerations bilaterally. Toenails 1-5 b/l elongated, discolored, dystrophic, thickened, crumbly with subungual debris and tenderness to dorsal palpation.  Musculoskeletal: Normal muscle strength 5/5 to all lower extremity muscle groups bilaterally. Fibular deviation of digits at level of MPJs consistent with rheumatoid foot b/l. Collapse of plantarmedial midfoot with prominent navicular noted b/l.Marland Kitchen No pain crepitus or joint limitation noted with ROM b/l.  Neurological Examination: Protective sensation diminished with 10g monofilament b/l. Vibratory sensation diminished b/l. Babinski reflex negative b/l. Clonus negative b/l.  Assessment: 1. Pain due to onychomycosis of toenails of both feet   2. Foot deformity, bilateral   3. Rheumatoid arthritis involving both feet, unspecified whether rheumatoid factor present ([place])   4. PAD (peripheral artery disease) ([place])   5. Diabetic polyneuropathy associated with type 2 diabetes mellitus ([place])   Plan: -Examined patient. -No new findings. No new orders. -Toenails 1-5 b/l were debrided in length and girth with sterile nail nippers and dremel without iatrogenic bleeding.  -Patient to continue soft, supportive shoe gear daily. -Patient to report any pedal injuries to medical professional immediately. -Patient/POA to call should there be question/concern in the interim.  Return in about 3 months (around 11/12/2019).  Marzetta Board, DPM

## 2019-08-26 DIAGNOSIS — M25661 Stiffness of right knee, not elsewhere classified: Secondary | ICD-10-CM | POA: Diagnosis not present

## 2019-08-26 DIAGNOSIS — M25561 Pain in right knee: Secondary | ICD-10-CM | POA: Diagnosis not present

## 2019-08-26 DIAGNOSIS — Z96652 Presence of left artificial knee joint: Secondary | ICD-10-CM | POA: Diagnosis not present

## 2019-08-26 DIAGNOSIS — Z96651 Presence of right artificial knee joint: Secondary | ICD-10-CM | POA: Diagnosis not present

## 2019-08-27 ENCOUNTER — Ambulatory Visit: Payer: Self-pay | Admitting: *Deleted

## 2019-08-31 ENCOUNTER — Encounter: Payer: Self-pay | Admitting: *Deleted

## 2019-08-31 ENCOUNTER — Other Ambulatory Visit: Payer: Self-pay | Admitting: *Deleted

## 2019-08-31 NOTE — Patient Outreach (Signed)
[facility]) Care Management  08/31/2019  [name] [birth year] [id number]    CSW will perform a case closure on patient, due to inability to establish initial phone contact, despite required number of phone attempts made and Unsuccessful Outreach Letter mailed to patient's home, allowing one month for a response, if patient was interested in receiving social work services and resources through [place] with Scientist, clinical (histocompatibility and immunogenetics).  CSW will notify patient's [place], and individual placing initial referral, also with [place] Management, Hubert Azure of CSW's plans to close patient's case.  CSW will fax an update to patient's Primary Care Physician, [name] to ensure that he is aware of CSW's attempts at involvement with patient's plan of care.   Nat Christen, BSW, MSW, LCSW  Licensed Education officer, environmental Health System  Mailing [place] [address] [place], [place] [address] Physical Address-[address], [place],  [address] Toll Free Main # [phone] Fax # [phone] Cell # [phone]  Office # [phone] [email or web address]

## 2019-09-03 ENCOUNTER — Ambulatory Visit: Payer: Self-pay | Admitting: *Deleted

## 2019-09-10 ENCOUNTER — Other Ambulatory Visit: Payer: Self-pay | Admitting: Internal Medicine

## 2019-09-15 ENCOUNTER — Other Ambulatory Visit: Payer: Self-pay | Admitting: Internal Medicine

## 2019-09-15 DIAGNOSIS — N644 Mastodynia: Secondary | ICD-10-CM

## 2019-09-20 ENCOUNTER — Other Ambulatory Visit: Payer: Self-pay | Admitting: *Deleted

## 2019-09-20 NOTE — Patient Outreach (Signed)
[facility]) Care Management  09/20/2019  [name] 11-30-1940 [id number]   RN Health CoachQuarterlyOutreach  Referral Date: 01/05/2019 Referral Source: EMMI Prevent Reason for Referral: Disease Management Education Insurance: Medicare   Outreach Attempt: Outreach attempt #1 to patient for follow up. No answer. RN Health Coach left HIPAA compliant voicemail message along with contact information.   Plan:  RN Health Coach will make another outreach attempt within the month of August if no return call back from patient.   [place] [phone] [name]

## 2019-10-01 ENCOUNTER — Other Ambulatory Visit: Payer: Self-pay

## 2019-10-01 ENCOUNTER — Ambulatory Visit: Payer: Medicare Other

## 2019-10-01 ENCOUNTER — Ambulatory Visit
Admission: RE | Admit: 2019-10-01 | Discharge: 2019-10-01 | Disposition: A | Discharge status: Home / Self Care | Payer: Medicare Other | Source: Ambulatory Visit | Attending: Internal Medicine | Admitting: Internal Medicine

## 2019-10-01 DIAGNOSIS — R928 Other abnormal and inconclusive findings on diagnostic imaging of breast: Secondary | ICD-10-CM | POA: Diagnosis not present

## 2019-10-01 DIAGNOSIS — N644 Mastodynia: Secondary | ICD-10-CM

## 2019-10-15 ENCOUNTER — Other Ambulatory Visit: Payer: Self-pay | Admitting: *Deleted

## 2019-10-15 NOTE — Patient Outreach (Signed)
[facility]) Care Management  10/15/2019  [name] [birth year] [id number]   RN Health CoachQuarterlyOutreach  Referral Date: 01/05/2019 Referral Source: EMMI Prevent Reason for Referral: Disease Management Education Insurance: Medicare   Outreach Attempt:  Outreach attempt #2 to patient for follow up. No answer. RN Health Coach left HIPAA compliant voicemail message along with contact information.  Plan:  RN Health Coach will make another outreach attempt within the month of September if no return call back from patient.   [place] [phone] [name]

## 2019-10-25 DIAGNOSIS — Z23 Encounter for immunization: Secondary | ICD-10-CM | POA: Diagnosis not present

## 2019-10-25 IMAGING — DX DG CHEST 1V PORT
1 series · 1 of 1 positions shown · non-contrast
Comparison: September 13, 2016

CLINICAL DATA: Preoperative for wound infection

EXAM:
PORTABLE CHEST 1 VIEW

[chest ap]
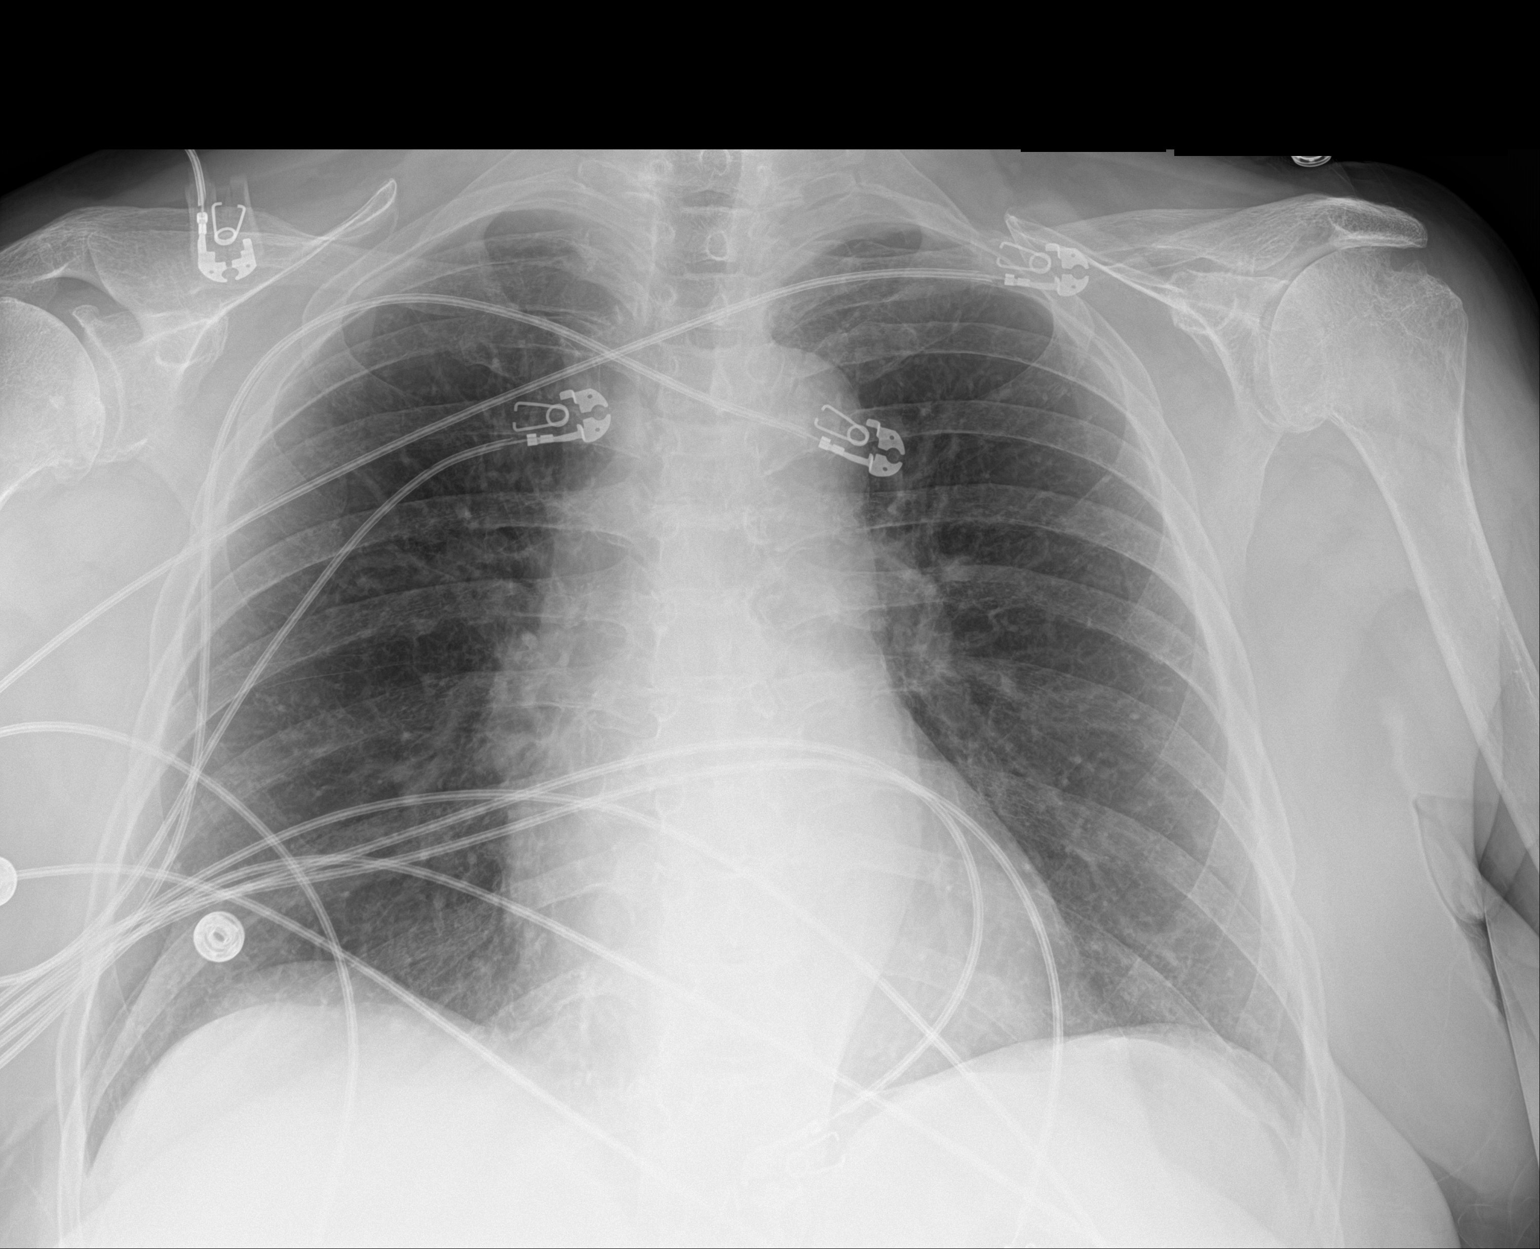

[1 of 1 positions shown; findings below may reference images not displayed]

FINDINGS: The heart size and mediastinal contours are within normal limits.
The aorta is tortuous. Both lungs are clear. The visualized skeletal
structures are unremarkable.
IMPRESSION: No active cardiopulmonary disease.

## 2019-11-16 ENCOUNTER — Other Ambulatory Visit: Payer: Self-pay | Admitting: *Deleted

## 2019-11-16 NOTE — Patient Outreach (Signed)
[facility]) Care Management  11/16/2019  [name] 1940-08-24 [id number]   RN Health CoachCase Closure  Referral Date: 01/05/2019 Referral Source: EMMI Prevent Reason for Referral: Disease Management Education Insurance: Medicare   Outreach Attempt:  Successful telephone outreach to patient.  Upon patient answering patient stating, " I do not believe I need your services any longer".  States she feels she is doing well managing.  Discussed increase in Hgb A1C and possible need for more education.  States she has discussed increase with her primary care and just needs to be more consistent with her insulin.  Still declining further outreaches and services at this time.  Encouraged patient to contact [facility] in the future if she would like to resume services.  Plan:  RN Health Coach will close case and make patient inactive with [facility] as patient is declining services.  RN Health Coach will send primary care provider case closure letter.  RN Health Coach will send patient case closure letter.   [place] [phone] [name]

## 2019-12-10 DIAGNOSIS — M25511 Pain in right shoulder: Secondary | ICD-10-CM | POA: Insufficient documentation

## 2019-12-10 DIAGNOSIS — M25512 Pain in left shoulder: Secondary | ICD-10-CM | POA: Insufficient documentation

## 2019-12-13 DIAGNOSIS — M12812 Other specific arthropathies, not elsewhere classified, left shoulder: Secondary | ICD-10-CM | POA: Insufficient documentation

## 2019-12-13 DIAGNOSIS — M25812 Other specified joint disorders, left shoulder: Secondary | ICD-10-CM | POA: Diagnosis not present

## 2019-12-13 DIAGNOSIS — M19111 Post-traumatic osteoarthritis, right shoulder: Secondary | ICD-10-CM | POA: Diagnosis not present

## 2019-12-28 DIAGNOSIS — R350 Frequency of micturition: Secondary | ICD-10-CM | POA: Diagnosis not present

## 2020-01-13 ENCOUNTER — Other Ambulatory Visit: Payer: Self-pay

## 2020-01-13 ENCOUNTER — Encounter: Payer: Self-pay | Admitting: Podiatry

## 2020-01-13 ENCOUNTER — Ambulatory Visit (INDEPENDENT_AMBULATORY_CARE_PROVIDER_SITE_OTHER): Payer: Medicare Other | Admitting: Podiatry

## 2020-01-13 ENCOUNTER — Other Ambulatory Visit: Payer: Self-pay | Admitting: *Deleted

## 2020-01-13 DIAGNOSIS — M069 Rheumatoid arthritis, unspecified: Secondary | ICD-10-CM | POA: Diagnosis not present

## 2020-01-13 DIAGNOSIS — M79675 Pain in left toe(s): Secondary | ICD-10-CM

## 2020-01-13 DIAGNOSIS — B351 Tinea unguium: Secondary | ICD-10-CM

## 2020-01-13 DIAGNOSIS — E1142 Type 2 diabetes mellitus with diabetic polyneuropathy: Secondary | ICD-10-CM | POA: Diagnosis not present

## 2020-01-13 DIAGNOSIS — M21962 Unspecified acquired deformity of left lower leg: Secondary | ICD-10-CM

## 2020-01-13 DIAGNOSIS — M79674 Pain in right toe(s): Secondary | ICD-10-CM | POA: Diagnosis not present

## 2020-01-13 DIAGNOSIS — M21961 Unspecified acquired deformity of right lower leg: Secondary | ICD-10-CM | POA: Diagnosis not present

## 2020-01-13 DIAGNOSIS — E119 Type 2 diabetes mellitus without complications: Secondary | ICD-10-CM

## 2020-01-16 NOTE — Progress Notes (Signed)
ANNUAL DIABETIC FOOT EXAM  Subjective: [name] presents today for for annual diabetic foot examination and painful thick toenails that are difficult to trim. Pain interferes with ambulation. Aggravating factors include wearing enclosed shoe gear. Pain is relieved with periodic professional debridement..  Patient relates 3 year h/o diabetes.  Patient relates no h/o foot wounds.  Patient does have symptoms of foot numbness.  Patient denies symptoms of foot tingling.  Patient denies symptoms of burning in feet.  Patient did not check blood glucose this morning.  [name], MD is patient's PCP. Last visit was 6 months ago.  She states three months may be too soon for her toenails as she feels they are not growing much.  Past Medical History:  Diagnosis Date  . Asthma   . Complication of anesthesia    Anesthesia told her years ago she got too cold during surgery  . Diabetes mellitus without complication ([place])   . Emphysema   . Fibromyalgia   . Osteoporosis   . Rheumatoid arthritis(714.0)   . Sleep apnea    no cpap   Patient Active Problem List   Diagnosis Date Noted  . Hearing loss, sensorineural, 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  . Tinnitus, left 01/08/2019  . DM type 2, not at goal [facility]) 06/03/2018  . Frequent infections 05/21/2018  . Gangrene ([place]) 05/21/2018  . Pain in finger 05/07/2018  . Family history of aneurysm 05/07/2018  . Diabetes mellitus due to underlying condition, controlled, with complication, without long-term current use of insulin ([place])   . Lower urinary tract infectious disease   . Palpitations 01/17/2018  . Dehydration 01/17/2018  . Hypotension 01/17/2018  . Symptom of blood in stool 01/17/2018  . Diarrhea in adult patient 01/17/2018  . 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  . Aftercare following joint replacement 08/21/2017  . 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 Closed displaced transverse fracture of right patella 05/06/2017  . Anemia 04/16/2017  . Chronic interstitial lung disease ([place]) 04/16/2017  . Chronic kidney disease (CKD), stage III (moderate) ([place]) 04/16/2017  . Other hyperlipidemia 04/16/2017  . Type 2 diabetes mellitus without complication, without long-term current use of insulin ([place]) 04/16/2017  . Pain in right knee 03/25/2017  . Arthritis, septic, knee ([place]) 03/09/2017  . 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 Fibromyalgia 06/04/2016  . High risk medication use 06/04/2016  . Claw toe, acquired, left 01/29/2016  . Rheumatoid arthritis involving both feet with negative rheumatoid factor ([place]) 01/29/2016  . Septic prepatellar bursitis of right knee 10/26/2014  . Prepatellar bursitis of right knee 08/28/2014  . DYSPHAGIA ORAL PHASE 06/18/2007  . Asthma 05/14/2007  . SLEEP APNEA 05/14/2007  . Sleep apnea 05/14/2007  . GERD 05/13/2007  . Rheumatoid arthritis ([place]) 05/13/2007  . OSTEOPOROSIS 05/13/2007  . DIARRHEA, CHRONIC 05/13/2007   Past Surgical History:  Procedure Laterality Date  . ABDOMINAL HYSTERECTOMY    . ANKLE FRACTURE SURGERY  2007  . BREAST SURGERY     mammoplasty and then removed  . FOOT SURGERY     numerous  . I & D EXTREMITY Right 10/26/2014   Procedure: IRRIGATION AND DEBRIDEMENT RIGHT KNEE ;  Surgeon: Gaynelle Arabian, MD;  Location: [facility];  Service: Orthopedics;  Laterality: Right;  . IRRIGATION AND DEBRIDEMENT KNEE Right 03/09/2017   Procedure: IRRIGATION AND DEBRIDEMENT RIGHT KNEE PRE-PATELLAR BURSA;  Surgeon: Susa Day, MD;  Location: [facility];  Service: Orthopedics;  Laterality: Right;  . KNEE BURSECTOMY Right 09/16/2014   Procedure: EXCISON OF PRE PATELLA BURSA RIGHT KNEE ;  Surgeon: Gaynelle Arabian, MD;  Location: [facility];  Service: Orthopedics;  Laterality: Right;  . PARTIAL HYSTERECTOMY  1970  . TOTAL KNEE ARTHROPLASTY  2005 / 2006   x2    Current Outpatient Medications on File Prior to Visit  Medication Sig Dispense Refill  . albuterol (VENTOLIN HFA) 108 (90 Base) MCG/ACT inhaler     . atorvastatin (LIPITOR) 40 MG tablet Take 40 mg by mouth daily at 6 PM.     . cholecalciferol (VITAMIN D) 1000 UNITS tablet Take 1,000 Units by mouth at bedtime.     . Cyanocobalamin (B-12) 2500 MCG TABS Take 2,500 mcg by mouth at bedtime.     . DULoxetine (CYMBALTA) 60 MG capsule Take 60 mg by mouth at bedtime.     Marland Kitchen ezetimibe (ZETIA) 10 MG tablet Take 10 mg by mouth at bedtime.     . ferrous sulfate 325 (65 FE) MG tablet Take 325 mg by mouth at bedtime.     . hydroxychloroquine (PLAQUENIL) 200 MG tablet Take 400 mg by mouth daily.     . insulin NPH-regular Human (70-30) 100 UNIT/ML injection Inject 14 Units into the skin 2 (two) times daily with a meal.    . lipase/protease/amylase (CREON) 12000 units CPEP capsule Take 24,000 Units by mouth 3 (three) times daily with meals.    . metFORMIN (GLUCOPHAGE) 500 MG tablet Take 1,000 mg by mouth at bedtime.     Marland Kitchen morphine (MSIR) 30 MG tablet Take 30 mg by mouth daily as needed for severe pain.    . Multiple Vitamin (MULTIVITAMIN WITH MINERALS) TABS tablet Take 1 tablet by mouth at bedtime.    . Omega-3 Fatty Acids (FISH OIL) 1200 MG CAPS Take 1,200 mg by mouth at bedtime.     [name]     . pantoprazole (PROTONIX) 40 MG tablet Take one tablet once every morning (Patient taking differently: 80 mg daily. Take one tablet once every morning) 30 tablet 5  . predniSONE (DELTASONE) 5 MG tablet Take 5 mg by mouth daily with breakfast.      No current facility-administered medications on file prior to visit.    Allergies  Allergen Reactions  . Penicillins Hives    Has patient had a PCN reaction causing immediate rash, facial/tongue/throat swelling, SOB or lightheadedness with hypotension: Yes Has patient had a PCN reaction causing severe rash involving mucus membranes or skin necrosis:  No Has patient had a PCN reaction that required hospitalization: No Has patient had a PCN reaction occurring within the last 10 years: No If all of the above answers are "NO", then may proceed with Cephalosporin use. Patient has tolerated ceftriaxone in the recent past.  . Sulfamethoxazole-Trimethoprim Nausea And Vomiting    REACTION: stomach problems  . Meperidine Other (See Comments)  . Sulfa Antibiotics Nausea And Vomiting    REACTION: stomach problems  . Sulfonamide Derivatives Nausea And Vomiting    REACTION: stomach problems   Social History   Occupational History  . Not on file  Tobacco Use  . Smoking status: Former Smoker    Quit date: 03/11/1994    Years since quitting: 25.8  . Smokeless tobacco: Never Used  Vaping Use  . Vaping Use: Never used  Substance and Sexual Activity  . Alcohol use: No  . Drug use: No  . Sexual activity: Not on file  Family History  Problem Relation Age of Onset  . Hypertension Other   . Diabetes Other   . Stroke Other   . Diabetes Mother   . Diabetes Father   . Diabetes Sister   . Diabetes Maternal Aunt   . Cancer Paternal Uncle   . Cancer Paternal Grandmother   . Breast cancer Paternal Grandmother    Immunization History  Administered Date(s) Administered  . Influenza, High Dose Seasonal PF 01/03/2016  . Influenza,inj,quad, With Preservative 11/11/2016, 12/09/2017  . Influenza-Unspecified 11/23/2016  . Zoster Recombinat (Shingrix) 12/17/2016, 06/20/2017     Review of Systems: Negative except as noted in the HPI.  Objective: There were no vitals filed for this visit.  AJNA MOORS is a pleasant 79 y.o. Caucasian female in NAD. AAO X 3.  Vascular Examination: Capillary fill time to digits <3 seconds b/l lower extremities. Faintly palpable pedal pulses b/l. Nonpalpable PT pulse(s) b/l lower extremities. Pedal hair absent. Lower extremity skin temperature gradient within normal limits. Trace edema noted b/l lower extremities.  Varicosities present b/l.  Dermatological Examination: Pedal skin with normal turgor, texture and tone bilaterally. No open wounds bilaterally. No interdigital macerations bilaterally. Toenails 1-5 b/l elongated, discolored, dystrophic, thickened, crumbly with subungual debris and tenderness to dorsal palpation.  Musculoskeletal Examination: Normal muscle strength 5/5 to all lower extremity muscle groups bilaterally. Fibular deviation of digits 1-5 bilaterally. Collapse of plantarmedial midfoot with prominent navicular noted b/l.   Footwear Assessment: Does the patient wear appropriate shoes? Yes. Does the patient need inserts/orthotics? No..  Neurological Examination: Protective sensation diminished with 10g monofilament b/l. Vibratory sensation diminished b/l.  Assessment: 1. Pain due to onychomycosis of toenails of both feet   2. Rheumatoid arthritis involving both feet, unspecified whether rheumatoid factor present ([place])   3. Foot deformity, bilateral   4. Diabetic polyneuropathy associated with type 2 diabetes mellitus ([place])   5. Encounter for diabetic foot exam ([place])     ADA Risk Categorization:  High Risk  Patient has one or more of the following: Loss of protective sensation Absent pedal pulses Severe Foot deformity History of foot ulcer  Plan: -Examined patient. -Diabetic foot examination performed on today's visit. -Patient to continue soft, supportive shoe gear daily. -Toenails 1-5 b/l were debrided in length and girth with sterile nail nippers and dremel without iatrogenic bleeding.  -Patient to report any pedal injuries to medical professional immediately. -Patient/POA to call should there be question/concern in the interim.  Return in 15 weeks (on 04/27/2020) for diabetic foot care.  Marzetta Board, DPM

## 2020-01-21 DIAGNOSIS — E785 Hyperlipidemia, unspecified: Secondary | ICD-10-CM | POA: Diagnosis not present

## 2020-01-21 DIAGNOSIS — E1151 Type 2 diabetes mellitus with diabetic peripheral angiopathy without gangrene: Secondary | ICD-10-CM | POA: Diagnosis not present

## 2020-01-21 DIAGNOSIS — M810 Age-related osteoporosis without current pathological fracture: Secondary | ICD-10-CM | POA: Diagnosis not present

## 2020-01-27 DIAGNOSIS — J849 Interstitial pulmonary disease, unspecified: Secondary | ICD-10-CM | POA: Diagnosis not present

## 2020-01-27 DIAGNOSIS — D801 Nonfamilial hypogammaglobulinemia: Secondary | ICD-10-CM | POA: Diagnosis not present

## 2020-01-27 DIAGNOSIS — Z0000 Encounter for general adult medical examination without abnormal findings: Secondary | ICD-10-CM | POA: Diagnosis not present

## 2020-01-27 DIAGNOSIS — K8681 Exocrine pancreatic insufficiency: Secondary | ICD-10-CM | POA: Diagnosis not present

## 2020-01-27 DIAGNOSIS — E785 Hyperlipidemia, unspecified: Secondary | ICD-10-CM | POA: Diagnosis not present

## 2020-01-27 DIAGNOSIS — J449 Chronic obstructive pulmonary disease, unspecified: Secondary | ICD-10-CM | POA: Diagnosis not present

## 2020-01-27 DIAGNOSIS — E1151 Type 2 diabetes mellitus with diabetic peripheral angiopathy without gangrene: Secondary | ICD-10-CM | POA: Diagnosis not present

## 2020-01-27 DIAGNOSIS — Z7952 Long term (current) use of systemic steroids: Secondary | ICD-10-CM | POA: Diagnosis not present

## 2020-01-27 DIAGNOSIS — F329 Major depressive disorder, single episode, unspecified: Secondary | ICD-10-CM | POA: Diagnosis not present

## 2020-01-27 DIAGNOSIS — M810 Age-related osteoporosis without current pathological fracture: Secondary | ICD-10-CM | POA: Diagnosis not present

## 2020-01-27 DIAGNOSIS — M069 Rheumatoid arthritis, unspecified: Secondary | ICD-10-CM | POA: Diagnosis not present

## 2020-02-01 DIAGNOSIS — Z1212 Encounter for screening for malignant neoplasm of rectum: Secondary | ICD-10-CM | POA: Diagnosis not present

## 2020-03-15 DIAGNOSIS — J449 Chronic obstructive pulmonary disease, unspecified: Secondary | ICD-10-CM | POA: Diagnosis not present

## 2020-03-15 DIAGNOSIS — J302 Other seasonal allergic rhinitis: Secondary | ICD-10-CM | POA: Diagnosis not present

## 2020-03-15 DIAGNOSIS — Z1152 Encounter for screening for COVID-19: Secondary | ICD-10-CM | POA: Diagnosis not present

## 2020-03-15 DIAGNOSIS — Z20818 Contact with and (suspected) exposure to other bacterial communicable diseases: Secondary | ICD-10-CM | POA: Diagnosis not present

## 2020-03-15 DIAGNOSIS — N390 Urinary tract infection, site not specified: Secondary | ICD-10-CM | POA: Diagnosis not present

## 2020-03-15 DIAGNOSIS — E119 Type 2 diabetes mellitus without complications: Secondary | ICD-10-CM | POA: Diagnosis not present

## 2020-03-15 DIAGNOSIS — R062 Wheezing: Secondary | ICD-10-CM | POA: Diagnosis not present

## 2020-03-15 DIAGNOSIS — R059 Cough, unspecified: Secondary | ICD-10-CM | POA: Diagnosis not present

## 2020-03-30 DIAGNOSIS — L03119 Cellulitis of unspecified part of limb: Secondary | ICD-10-CM | POA: Diagnosis not present

## 2020-03-30 DIAGNOSIS — R059 Cough, unspecified: Secondary | ICD-10-CM | POA: Diagnosis not present

## 2020-03-30 DIAGNOSIS — Z1152 Encounter for screening for COVID-19: Secondary | ICD-10-CM | POA: Diagnosis not present

## 2020-03-30 DIAGNOSIS — M069 Rheumatoid arthritis, unspecified: Secondary | ICD-10-CM | POA: Diagnosis not present

## 2020-04-27 ENCOUNTER — Ambulatory Visit (INDEPENDENT_AMBULATORY_CARE_PROVIDER_SITE_OTHER): Payer: Medicare Other | Admitting: Podiatry

## 2020-04-27 ENCOUNTER — Encounter: Payer: Self-pay | Admitting: Podiatry

## 2020-04-27 ENCOUNTER — Other Ambulatory Visit: Payer: Self-pay

## 2020-04-27 ENCOUNTER — Other Ambulatory Visit: Payer: Self-pay | Admitting: *Deleted

## 2020-04-27 DIAGNOSIS — B351 Tinea unguium: Secondary | ICD-10-CM

## 2020-04-27 DIAGNOSIS — M79675 Pain in left toe(s): Secondary | ICD-10-CM

## 2020-04-27 DIAGNOSIS — M79674 Pain in right toe(s): Secondary | ICD-10-CM

## 2020-04-27 DIAGNOSIS — M21962 Unspecified acquired deformity of left lower leg: Secondary | ICD-10-CM

## 2020-04-27 DIAGNOSIS — M069 Rheumatoid arthritis, unspecified: Secondary | ICD-10-CM

## 2020-04-27 DIAGNOSIS — M21961 Unspecified acquired deformity of right lower leg: Secondary | ICD-10-CM

## 2020-04-27 DIAGNOSIS — E1142 Type 2 diabetes mellitus with diabetic polyneuropathy: Secondary | ICD-10-CM

## 2020-04-30 NOTE — Progress Notes (Signed)
Subjective: [name] presents today for at risk foot care with history of diabetic neuropathy and painful thick toenails that are difficult to trim. Pain interferes with ambulation. Aggravating factors include wearing enclosed shoe gear. Pain is relieved with periodic professional debridement.  Patient did not check blood glucose this morning.  [name], MD is patient's PCP. Last visit was 3 months ago.   Allergies  Allergen Reactions  . Penicillins Hives    Has patient had a PCN reaction causing immediate rash, facial/tongue/throat swelling, SOB or lightheadedness with hypotension: Yes Has patient had a PCN reaction causing severe rash involving mucus membranes or skin necrosis: No Has patient had a PCN reaction that required hospitalization: No Has patient had a PCN reaction occurring within the last 10 years: No If all of the above answers are "NO", then may proceed with Cephalosporin use. Patient has tolerated ceftriaxone in the recent past.  . Sulfamethoxazole-Trimethoprim Nausea And Vomiting    REACTION: stomach problems  . Meperidine Other (See Comments)  . Sulfa Antibiotics Nausea And Vomiting    REACTION: stomach problems  . Sulfonamide Derivatives Nausea And Vomiting    REACTION: stomach problems   Review of Systems: Negative except as noted in the HPI.  Objective: There were no vitals filed for this visit.  [name] is a pleasant 80 y.o. Caucasian female in NAD. AAO X 3.  Vascular Examination: Capillary fill time to digits <3 seconds b/l lower extremities. Faintly palpable pedal pulses b/l. Nonpalpable PT pulse(s) b/l lower extremities. Pedal hair absent. Lower extremity skin temperature gradient within normal limits. Trace edema noted b/l lower extremities. Varicosities present b/l.  Dermatological Examination: Pedal skin with normal turgor, texture and tone bilaterally. No open wounds bilaterally. No interdigital macerations bilaterally. Toenails 1-5 b/l  elongated, discolored, dystrophic, thickened, crumbly with subungual debris and tenderness to dorsal palpation.  Musculoskeletal Examination: Normal muscle strength 5/5 to all lower extremity muscle groups bilaterally. Fibular deviation of digits 1-5 bilaterally. Collapse of plantarmedial midfoot with prominent navicular noted b/l.   Neurological Examination: Protective sensation diminished with 10g monofilament b/l. Vibratory sensation diminished b/l.  Assessment: 1. Pain due to onychomycosis of toenails of both feet   2. Foot deformity, bilateral   3. Rheumatoid arthritis involving both feet, unspecified whether rheumatoid factor present ([place])   4. Diabetic polyneuropathy associated with type 2 diabetes mellitus ([place])     Plan: -Examined patient. -No new findings. No new orders. -Patient to continue soft, supportive shoe gear daily. -Toenails 1-5 b/l were debrided in length and girth with sterile nail nippers and dremel without iatrogenic bleeding.  -Patient to report any pedal injuries to medical professional immediately. -Patient/POA to call should there be question/concern in the interim.  Return in about 3 months (around 07/25/2020).  Marzetta Board, DPM

## 2020-05-04 DIAGNOSIS — F329 Major depressive disorder, single episode, unspecified: Secondary | ICD-10-CM | POA: Diagnosis not present

## 2020-05-04 DIAGNOSIS — D801 Nonfamilial hypogammaglobulinemia: Secondary | ICD-10-CM | POA: Diagnosis not present

## 2020-05-04 DIAGNOSIS — M069 Rheumatoid arthritis, unspecified: Secondary | ICD-10-CM | POA: Diagnosis not present

## 2020-05-04 DIAGNOSIS — J449 Chronic obstructive pulmonary disease, unspecified: Secondary | ICD-10-CM | POA: Diagnosis not present

## 2020-05-04 DIAGNOSIS — I700 Atherosclerosis of aorta: Secondary | ICD-10-CM | POA: Diagnosis not present

## 2020-05-04 DIAGNOSIS — E1151 Type 2 diabetes mellitus with diabetic peripheral angiopathy without gangrene: Secondary | ICD-10-CM | POA: Diagnosis not present

## 2020-05-04 DIAGNOSIS — J45909 Unspecified asthma, uncomplicated: Secondary | ICD-10-CM | POA: Diagnosis not present

## 2020-05-04 DIAGNOSIS — Z7952 Long term (current) use of systemic steroids: Secondary | ICD-10-CM | POA: Diagnosis not present

## 2020-05-04 DIAGNOSIS — J849 Interstitial pulmonary disease, unspecified: Secondary | ICD-10-CM | POA: Diagnosis not present

## 2020-05-04 DIAGNOSIS — M5136 Other intervertebral disc degeneration, lumbar region: Secondary | ICD-10-CM | POA: Diagnosis not present

## 2020-05-04 DIAGNOSIS — D692 Other nonthrombocytopenic purpura: Secondary | ICD-10-CM | POA: Diagnosis not present

## 2020-05-16 ENCOUNTER — Other Ambulatory Visit (HOSPITAL_COMMUNITY): Payer: Self-pay | Admitting: *Deleted

## 2020-05-17 ENCOUNTER — Other Ambulatory Visit: Payer: Self-pay

## 2020-05-17 ENCOUNTER — Ambulatory Visit (HOSPITAL_COMMUNITY)
Admission: RE | Admit: 2020-05-17 | Discharge: 2020-05-17 | Disposition: A | Discharge status: Home / Self Care | Payer: Medicare Other | Source: Ambulatory Visit | Attending: Internal Medicine | Admitting: Internal Medicine

## 2020-05-17 DIAGNOSIS — M810 Age-related osteoporosis without current pathological fracture: Secondary | ICD-10-CM | POA: Diagnosis not present

## 2020-05-17 MED ORDER — DENOSUMAB 60 MG/ML ~~LOC~~ SOSY
PREFILLED_SYRINGE | SUBCUTANEOUS | Status: AC
  Filled 2020-05-17: qty 1

## 2020-05-17 MED ORDER — DENOSUMAB 60 MG/ML ~~LOC~~ SOSY
60.0000 mg | PREFILLED_SYRINGE | Freq: Once | SUBCUTANEOUS | Status: AC
  Administered 2020-05-17: 60 mg via SUBCUTANEOUS

## 2020-05-22 ENCOUNTER — Encounter (HOSPITAL_COMMUNITY): Payer: Self-pay

## 2020-05-22 ENCOUNTER — Emergency Department (HOSPITAL_COMMUNITY): Payer: Medicare Other

## 2020-05-22 ENCOUNTER — Inpatient Hospital Stay (HOSPITAL_COMMUNITY)
Admission: EM | Admit: 2020-05-22 | Discharge: 2020-05-26 | DRG: 189 | Disposition: A | Discharge status: Home Health | Payer: Medicare Other | Attending: Internal Medicine | Admitting: Internal Medicine

## 2020-05-22 ENCOUNTER — Other Ambulatory Visit: Payer: Self-pay

## 2020-05-22 DIAGNOSIS — R0902 Hypoxemia: Secondary | ICD-10-CM | POA: Diagnosis present

## 2020-05-22 DIAGNOSIS — K529 Noninfective gastroenteritis and colitis, unspecified: Secondary | ICD-10-CM | POA: Diagnosis present

## 2020-05-22 DIAGNOSIS — Z90711 Acquired absence of uterus with remaining cervical stump: Secondary | ICD-10-CM

## 2020-05-22 DIAGNOSIS — R109 Unspecified abdominal pain: Secondary | ICD-10-CM | POA: Diagnosis not present

## 2020-05-22 DIAGNOSIS — J45909 Unspecified asthma, uncomplicated: Secondary | ICD-10-CM | POA: Diagnosis present

## 2020-05-22 DIAGNOSIS — Z794 Long term (current) use of insulin: Secondary | ICD-10-CM

## 2020-05-22 DIAGNOSIS — M797 Fibromyalgia: Secondary | ICD-10-CM | POA: Diagnosis present

## 2020-05-22 DIAGNOSIS — E669 Obesity, unspecified: Secondary | ICD-10-CM | POA: Diagnosis present

## 2020-05-22 DIAGNOSIS — Z803 Family history of malignant neoplasm of breast: Secondary | ICD-10-CM

## 2020-05-22 DIAGNOSIS — W19XXXA Unspecified fall, initial encounter: Secondary | ICD-10-CM | POA: Diagnosis not present

## 2020-05-22 DIAGNOSIS — J439 Emphysema, unspecified: Secondary | ICD-10-CM | POA: Diagnosis not present

## 2020-05-22 DIAGNOSIS — Z8249 Family history of ischemic heart disease and other diseases of the circulatory system: Secondary | ICD-10-CM

## 2020-05-22 DIAGNOSIS — G4733 Obstructive sleep apnea (adult) (pediatric): Secondary | ICD-10-CM | POA: Diagnosis not present

## 2020-05-22 DIAGNOSIS — Z8261 Family history of arthritis: Secondary | ICD-10-CM

## 2020-05-22 DIAGNOSIS — Z7952 Long term (current) use of systemic steroids: Secondary | ICD-10-CM

## 2020-05-22 DIAGNOSIS — Z6828 Body mass index (BMI) 28.0-28.9, adult: Secondary | ICD-10-CM

## 2020-05-22 DIAGNOSIS — Z880 Allergy status to penicillin: Secondary | ICD-10-CM

## 2020-05-22 DIAGNOSIS — M069 Rheumatoid arthritis, unspecified: Secondary | ICD-10-CM | POA: Diagnosis present

## 2020-05-22 DIAGNOSIS — K219 Gastro-esophageal reflux disease without esophagitis: Secondary | ICD-10-CM | POA: Diagnosis not present

## 2020-05-22 DIAGNOSIS — Z79899 Other long term (current) drug therapy: Secondary | ICD-10-CM

## 2020-05-22 DIAGNOSIS — E088 Diabetes mellitus due to underlying condition with unspecified complications: Secondary | ICD-10-CM | POA: Diagnosis present

## 2020-05-22 DIAGNOSIS — Z7984 Long term (current) use of oral hypoglycemic drugs: Secondary | ICD-10-CM

## 2020-05-22 DIAGNOSIS — R0789 Other chest pain: Secondary | ICD-10-CM | POA: Diagnosis present

## 2020-05-22 DIAGNOSIS — N183 Chronic kidney disease, stage 3 unspecified: Secondary | ICD-10-CM | POA: Diagnosis present

## 2020-05-22 DIAGNOSIS — Z20822 Contact with and (suspected) exposure to covid-19: Secondary | ICD-10-CM | POA: Diagnosis present

## 2020-05-22 DIAGNOSIS — Z9181 History of falling: Secondary | ICD-10-CM

## 2020-05-22 DIAGNOSIS — Z882 Allergy status to sulfonamides status: Secondary | ICD-10-CM

## 2020-05-22 DIAGNOSIS — Z833 Family history of diabetes mellitus: Secondary | ICD-10-CM

## 2020-05-22 DIAGNOSIS — J9601 Acute respiratory failure with hypoxia: Secondary | ICD-10-CM | POA: Diagnosis not present

## 2020-05-22 DIAGNOSIS — G4730 Sleep apnea, unspecified: Secondary | ICD-10-CM | POA: Diagnosis present

## 2020-05-22 DIAGNOSIS — D649 Anemia, unspecified: Secondary | ICD-10-CM | POA: Diagnosis present

## 2020-05-22 DIAGNOSIS — E1122 Type 2 diabetes mellitus with diabetic chronic kidney disease: Secondary | ICD-10-CM | POA: Diagnosis present

## 2020-05-22 DIAGNOSIS — R079 Chest pain, unspecified: Secondary | ICD-10-CM

## 2020-05-22 DIAGNOSIS — M47812 Spondylosis without myelopathy or radiculopathy, cervical region: Secondary | ICD-10-CM | POA: Diagnosis present

## 2020-05-22 DIAGNOSIS — Z87891 Personal history of nicotine dependence: Secondary | ICD-10-CM

## 2020-05-22 DIAGNOSIS — Z888 Allergy status to other drugs, medicaments and biological substances status: Secondary | ICD-10-CM

## 2020-05-22 DIAGNOSIS — Z823 Family history of stroke: Secondary | ICD-10-CM

## 2020-05-22 DIAGNOSIS — M810 Age-related osteoporosis without current pathological fracture: Secondary | ICD-10-CM | POA: Diagnosis present

## 2020-05-22 DIAGNOSIS — N1831 Chronic kidney disease, stage 3a: Secondary | ICD-10-CM | POA: Diagnosis present

## 2020-05-22 DIAGNOSIS — Z96651 Presence of right artificial knee joint: Secondary | ICD-10-CM | POA: Diagnosis present

## 2020-05-22 LAB — HEPATIC FUNCTION PANEL
ALT: 14 U/L (ref 0–44)
AST: 18 U/L (ref 15–41)
Albumin: 3.2 g/dL — ABNORMAL LOW (ref 3.5–5.0)
Alkaline Phosphatase: 69 U/L (ref 38–126)
Bilirubin, Direct: 0.1 mg/dL (ref 0.0–0.2)
Indirect Bilirubin: 0.8 mg/dL (ref 0.3–0.9)
Total Bilirubin: 0.9 mg/dL (ref 0.3–1.2)
Total Protein: 7 g/dL (ref 6.5–8.1)

## 2020-05-22 LAB — CBC
HCT: 45 % (ref 36.0–46.0)
Hemoglobin: 13.6 g/dL (ref 12.0–15.0)
MCH: 26.2 pg (ref 26.0–34.0)
MCHC: 30.2 g/dL (ref 30.0–36.0)
MCV: 86.7 fL (ref 80.0–100.0)
Platelets: 421 10*3/uL — ABNORMAL HIGH (ref 150–400)
RBC: 5.19 MIL/uL — ABNORMAL HIGH (ref 3.87–5.11)
RDW: 15.9 % — ABNORMAL HIGH (ref 11.5–15.5)
WBC: 15.2 10*3/uL — ABNORMAL HIGH (ref 4.0–10.5)
nRBC: 0 % (ref 0.0–0.2)

## 2020-05-22 LAB — BRAIN NATRIURETIC PEPTIDE: B Natriuretic Peptide: 74.1 pg/mL (ref 0.0–100.0)

## 2020-05-22 LAB — URINALYSIS, ROUTINE W REFLEX MICROSCOPIC
Bilirubin Urine: NEGATIVE
Glucose, UA: 50 mg/dL — AB
Hgb urine dipstick: NEGATIVE
Ketones, ur: 5 mg/dL — AB
Leukocytes,Ua: NEGATIVE
Nitrite: NEGATIVE
Protein, ur: NEGATIVE mg/dL
Specific Gravity, Urine: <1.005 — ABNORMAL LOW (ref 1.005–1.030)
pH: 5 (ref 5.0–8.0)

## 2020-05-22 LAB — BASIC METABOLIC PANEL
Anion gap: 12 (ref 5–15)
BUN: 24 mg/dL — ABNORMAL HIGH (ref 8–23)
CO2: 22 mmol/L (ref 22–32)
Calcium: 9 mg/dL (ref 8.9–10.3)
Chloride: 105 mmol/L (ref 98–111)
Creatinine, Ser: 1.01 mg/dL — ABNORMAL HIGH (ref 0.44–1.00)
GFR, Estimated: 57 mL/min — ABNORMAL LOW (ref >60)
Glucose, Bld: 202 mg/dL — ABNORMAL HIGH (ref 70–99)
Potassium: 4.6 mmol/L (ref 3.5–5.1)
Sodium: 139 mmol/L (ref 135–145)

## 2020-05-22 LAB — TROPONIN I (HIGH SENSITIVITY)
Troponin I (High Sensitivity): 8 ng/L (ref <18)
Troponin I (High Sensitivity): 8 ng/L (ref <18)

## 2020-05-22 MED ORDER — IOHEXOL 350 MG/ML SOLN
100.0000 mL | Freq: Once | INTRAVENOUS | Status: AC | PRN
  Administered 2020-05-22: 100 mL via INTRAVENOUS

## 2020-05-22 MED ORDER — HYDROMORPHONE HCL 1 MG/ML IJ SOLN
0.5000 mg | Freq: Once | INTRAMUSCULAR | Status: AC
Start: 2020-05-22 — End: 2020-05-22
  Administered 2020-05-22: 0.5 mg via INTRAVENOUS
  Filled 2020-05-22: qty 1

## 2020-05-22 MED ORDER — HYDROCODONE-ACETAMINOPHEN 5-325 MG PO TABS
1.0000 | ORAL_TABLET | Freq: Four times a day (QID) | ORAL | Status: DC | PRN
  Administered 2020-05-23 – 2020-05-25 (×2): 1 via ORAL
  Filled 2020-05-22 (×3): qty 1

## 2020-05-22 MED ORDER — SODIUM CHLORIDE 0.9 % IV SOLN: INTRAVENOUS | Status: AC

## 2020-05-22 MED ORDER — EZETIMIBE 10 MG PO TABS
10.0000 mg | ORAL_TABLET | Freq: Every day | ORAL | Status: DC
  Administered 2020-05-23 – 2020-05-25 (×3): 10 mg via ORAL
  Filled 2020-05-22 (×3): qty 1

## 2020-05-22 MED ORDER — GABAPENTIN 100 MG PO CAPS
100.0000 mg | ORAL_CAPSULE | Freq: Three times a day (TID) | ORAL | Status: DC
Start: 2020-05-23 — End: 2020-05-26
  Administered 2020-05-23 – 2020-05-26 (×11): 100 mg via ORAL
  Filled 2020-05-22 (×11): qty 1

## 2020-05-22 MED ORDER — TRAMADOL HCL 50 MG PO TABS: 50.0000 mg | ORAL_TABLET | Freq: Three times a day (TID) | ORAL | Status: DC | PRN

## 2020-05-22 MED ORDER — DULOXETINE HCL 60 MG PO CPEP
60.0000 mg | ORAL_CAPSULE | Freq: Every day | ORAL | Status: DC
  Administered 2020-05-23 – 2020-05-25 (×3): 60 mg via ORAL
  Filled 2020-05-22 (×3): qty 1
  Filled 2020-05-22: qty 2

## 2020-05-22 MED ORDER — SODIUM CHLORIDE 0.9% FLUSH
3.0000 mL | Freq: Two times a day (BID) | INTRAVENOUS | Status: DC
  Administered 2020-05-22 – 2020-05-26 (×8): 3 mL via INTRAVENOUS

## 2020-05-22 MED ORDER — BUDESONIDE 0.25 MG/2ML IN SUSP
0.2500 mg | Freq: Two times a day (BID) | RESPIRATORY_TRACT | Status: DC
  Administered 2020-05-23 – 2020-05-26 (×7): 0.25 mg via RESPIRATORY_TRACT
  Filled 2020-05-22 (×7): qty 2

## 2020-05-22 MED ORDER — BUPROPION HCL ER (XL) 150 MG PO TB24: 150.0000 mg | ORAL_TABLET | Freq: Every day | ORAL | Status: DC

## 2020-05-22 MED ORDER — ALBUTEROL SULFATE HFA 108 (90 BASE) MCG/ACT IN AERS: 1.0000 | INHALATION_SPRAY | RESPIRATORY_TRACT | Status: DC | PRN

## 2020-05-22 MED ORDER — PREDNISONE 5 MG PO TABS
5.0000 mg | ORAL_TABLET | Freq: Every day | ORAL | Status: DC
  Administered 2020-05-23 – 2020-05-26 (×4): 5 mg via ORAL
  Filled 2020-05-22 (×4): qty 1

## 2020-05-22 MED ORDER — ACETAMINOPHEN 325 MG PO TABS
650.0000 mg | ORAL_TABLET | Freq: Four times a day (QID) | ORAL | Status: DC | PRN
  Administered 2020-05-23: 650 mg via ORAL
  Filled 2020-05-22: qty 2

## 2020-05-22 MED ORDER — INSULIN ASPART 100 UNIT/ML ~~LOC~~ SOLN
0.0000 [IU] | Freq: Three times a day (TID) | SUBCUTANEOUS | Status: DC
  Administered 2020-05-23 – 2020-05-24 (×3): 2 [IU] via SUBCUTANEOUS
  Administered 2020-05-24: 5 [IU] via SUBCUTANEOUS
  Administered 2020-05-24 – 2020-05-25 (×2): 1 [IU] via SUBCUTANEOUS
  Administered 2020-05-25: 3 [IU] via SUBCUTANEOUS
  Administered 2020-05-25: 2 [IU] via SUBCUTANEOUS
  Administered 2020-05-26: 1 [IU] via SUBCUTANEOUS
  Administered 2020-05-26: 2 [IU] via SUBCUTANEOUS
  Filled 2020-05-22: qty 0.09

## 2020-05-22 MED ORDER — ATORVASTATIN CALCIUM 20 MG PO TABS
20.0000 mg | ORAL_TABLET | Freq: Every day | ORAL | Status: DC
  Administered 2020-05-24 – 2020-05-26 (×3): 20 mg via ORAL
  Filled 2020-05-22 (×4): qty 1

## 2020-05-22 MED ORDER — FENTANYL CITRATE (PF) 100 MCG/2ML IJ SOLN
50.0000 ug | Freq: Once | INTRAMUSCULAR | Status: AC
  Administered 2020-05-22: 50 ug via INTRAVENOUS
  Filled 2020-05-22: qty 2

## 2020-05-22 MED ORDER — SODIUM CHLORIDE 0.9 % IV BOLUS
1000.0000 mL | Freq: Once | INTRAVENOUS | Status: AC
  Administered 2020-05-22: 1000 mL via INTRAVENOUS

## 2020-05-22 MED ORDER — ENOXAPARIN SODIUM 40 MG/0.4ML ~~LOC~~ SOLN
40.0000 mg | SUBCUTANEOUS | Status: DC
  Administered 2020-05-23 – 2020-05-26 (×4): 40 mg via SUBCUTANEOUS
  Filled 2020-05-22 (×4): qty 0.4

## 2020-05-22 MED ORDER — BECLOMETHASONE DIPROP HFA 40 MCG/ACT IN AERB: 2.0000 | INHALATION_SPRAY | Freq: Two times a day (BID) | RESPIRATORY_TRACT | Status: DC

## 2020-05-22 MED ORDER — ALBUTEROL SULFATE HFA 108 (90 BASE) MCG/ACT IN AERS
4.0000 | INHALATION_SPRAY | Freq: Once | RESPIRATORY_TRACT | Status: AC
  Administered 2020-05-22: 4 via RESPIRATORY_TRACT
  Filled 2020-05-22: qty 6.7

## 2020-05-22 MED ORDER — POLYETHYLENE GLYCOL 3350 17 G PO PACK: 17.0000 g | PACK | Freq: Every day | ORAL | Status: DC | PRN

## 2020-05-22 MED ORDER — DICLOFENAC SODIUM 1 % EX GEL
2.0000 g | Freq: Four times a day (QID) | CUTANEOUS | Status: DC
  Administered 2020-05-23 – 2020-05-25 (×9): 2 g via TOPICAL
  Filled 2020-05-22: qty 100

## 2020-05-22 MED ORDER — ACETAMINOPHEN 650 MG RE SUPP: 650.0000 mg | Freq: Four times a day (QID) | RECTAL | Status: DC | PRN

## 2020-05-22 MED ORDER — INSULIN GLARGINE 100 UNIT/ML ~~LOC~~ SOLN
8.0000 [IU] | Freq: Two times a day (BID) | SUBCUTANEOUS | Status: DC
  Filled 2020-05-22 (×2): qty 0.08

## 2020-05-22 MED ORDER — PANTOPRAZOLE SODIUM 40 MG PO TBEC
40.0000 mg | DELAYED_RELEASE_TABLET | Freq: Every day | ORAL | Status: DC
  Administered 2020-05-23 – 2020-05-26 (×4): 40 mg via ORAL
  Filled 2020-05-22 (×4): qty 1

## 2020-05-22 MED ORDER — HYDROMORPHONE HCL 1 MG/ML IJ SOLN
0.5000 mg | INTRAMUSCULAR | Status: AC | PRN
  Administered 2020-05-23 – 2020-05-24 (×2): 0.5 mg via INTRAVENOUS
  Filled 2020-05-22: qty 1
  Filled 2020-05-22: qty 0.5

## 2020-05-22 NOTE — H&P (Signed)
History and Physical   JESSICIA NAPOLITANO KZS:[id number] DOB: 01/29/1941 DOA: 05/22/2020  PCP: [name], MD   Patient coming from: Home  Chief Complaint: Chest pain, abdominal pain  HPI: [name] is a 80 y.o. female with medical history significant of acquired hypogammaglobulinemia, anemia, asthma, chronic lung disease, CKD 3, diabetes, dysphagia, fibromyalgia, chronic pain, GERD, hearing loss, RA, OSA who presents with chest pain and abdominal pain.  Patient states that she fell 3-4 weeks ago on her right side but did not have much pain initially. She states that she woke today with severe right-sided pain in her right chest and right abdominal wall. She does not believe this pain to be related to her fall.  She states the pain is rated as a 10 out of 10 and describes a sharp stabbing nonradiating pain.  Is located primarily in her right lower chest wall and right upper abdomen.  She states that she may have some shortness of breath and she has chronic diarrhea.  She otherwise denies fevers, constipation, nausea, vomiting.  ED Course: Vital signs in the ED significant for hypoxia on room air to 88% requiring 2 L to maintain saturations in the low to mid 90s.  Tachycardia to the 100s, intermittent tachypnea in the 20s, blood pressure in the 732K to 025K systolic.  Lab work-up showed CMP with BUN 24, glucose 202, albumin 3.2.  CBC with leukocytosis to 15, platelets 421.  Troponin negative x2.  BNP normal.  Respiratory panel flu COVID pending.  Urinalysis pending.  Chest x-ray without acute abnormality.  CT PE was negative for PE showed mild scarring, chronic rib fractures, stable splenic cyst, emphysema.  CT abdomen pelvis showed no acute unreality and did demonstrate diverticulosis.  Patient received fentanyl, Dilaudid and 1 L bolus in the ED.  Review of Systems: As per HPI otherwise all other systems reviewed and are negative.  Past Medical History:  Diagnosis Date  . Asthma   . Complication of  anesthesia    Anesthesia told her years ago she got too cold during surgery  . Diabetes mellitus without complication ([place])   . Emphysema   . Fibromyalgia   . Osteoporosis   . Rheumatoid arthritis(714.0)   . Sleep apnea    no cpap    Past Surgical History:  Procedure Laterality Date  . ABDOMINAL HYSTERECTOMY    . ANKLE FRACTURE SURGERY  2007  . BREAST SURGERY     mammoplasty and then removed  . FOOT SURGERY     numerous  . I & D EXTREMITY Right 10/26/2014   Procedure: IRRIGATION AND DEBRIDEMENT RIGHT KNEE ;  Surgeon: Gaynelle Arabian, MD;  Location: [facility];  Service: Orthopedics;  Laterality: Right;  . IRRIGATION AND DEBRIDEMENT KNEE Right 03/09/2017   Procedure: IRRIGATION AND DEBRIDEMENT RIGHT KNEE PRE-PATELLAR BURSA;  Surgeon: Susa Day, MD;  Location: [facility];  Service: Orthopedics;  Laterality: Right;  . KNEE BURSECTOMY Right 09/16/2014   Procedure: EXCISON OF PRE PATELLA BURSA RIGHT KNEE ;  Surgeon: Gaynelle Arabian, MD;  Location: [facility];  Service: Orthopedics;  Laterality: Right;  . PARTIAL HYSTERECTOMY  1970  . TOTAL KNEE ARTHROPLASTY  2005 / 2006   x2    Social History  reports that she quit smoking about 26 years ago. She has never used smokeless tobacco. She reports that she does not drink alcohol and does not use drugs.  Allergies  Allergen Reactions  . Penicillins Hives    Has patient had a PCN  reaction causing immediate rash, facial/tongue/throat swelling, SOB or lightheadedness with hypotension: Yes Has patient had a PCN reaction causing severe rash involving mucus membranes or skin necrosis: No Has patient had a PCN reaction that required hospitalization: No Has patient had a PCN reaction occurring within the last 10 years: No If all of the above answers are "NO", then may proceed with Cephalosporin use. Patient has tolerated ceftriaxone in the recent past.  . Sulfamethoxazole-Trimethoprim Nausea And Vomiting    REACTION: stomach problems  . Meperidine Other  (See Comments)  . Sulfa Antibiotics Nausea And Vomiting    REACTION: stomach problems  . Sulfonamide Derivatives Nausea And Vomiting    REACTION: stomach problems    Family History  Problem Relation Age of Onset  . Hypertension Other   . Diabetes Other   . Stroke Other   . Diabetes Mother   . Diabetes Father   . Diabetes Sister   . Diabetes Maternal Aunt   . Cancer Paternal Uncle   . Cancer Paternal Grandmother   . Breast cancer Paternal Grandmother   Reviewed on admission  Prior to Admission medications   Medication Sig Start Date End Date Taking? Authorizing Provider  albuterol (VENTOLIN HFA) 108 (90 Base) MCG/ACT inhaler  07/05/19   [provider]  atorvastatin (LIPITOR) 20 MG tablet Take 20 mg by mouth daily. 11/29/19   [provider]  atorvastatin (LIPITOR) 40 MG tablet Take 40 mg by mouth daily at 6 PM.  03/10/18   [provider]  azithromycin (ZITHROMAX) 250 MG tablet Take 250 mg by mouth as directed. 03/16/20   [provider]  B-D UF III MINI PEN NEEDLES 31G X 5 MM MISC SMARTSIG:1 Each SUB-Q 4 Times Daily 10/22/19   [provider]  buPROPion (WELLBUTRIN XL) 150 MG 24 hr tablet bupropion HCl XL 150 mg 24 hr tablet, extended release    [provider]  cholecalciferol (VITAMIN D) 1000 UNITS tablet Take 1,000 Units by mouth at bedtime.     [provider]  clotrimazole-betamethasone (LOTRISONE) cream Apply topically. 10/07/19   [provider]  Cyanocobalamin (B-12) 2500 MCG TABS Take 2,500 mcg by mouth at bedtime.     [provider]  doxycycline (VIBRA-TABS) 100 MG tablet Take 100 mg by mouth 2 (two) times daily. 03/30/20   [provider]  DULoxetine (CYMBALTA) 60 MG capsule Take 60 mg by mouth at bedtime.     [provider]  ezetimibe (ZETIA) 10 MG tablet Take 10 mg by mouth at bedtime.  11/04/16   [provider]  ferrous sulfate 325 (65 FE) MG tablet Take 325 mg by  mouth at bedtime.     [provider]  gabapentin (NEURONTIN) 100 MG capsule Take 100 mg by mouth 3 (three) times daily. 11/17/19   [provider]  hydroxychloroquine (PLAQUENIL) 200 MG tablet Take 400 mg by mouth daily.  11/11/18   [provider]  insulin NPH-regular Human (70-30) 100 UNIT/ML injection Inject 14 Units into the skin 2 (two) times daily with a meal.    [provider]  lipase/protease/amylase (CREON) 12000 units CPEP capsule Take 24,000 Units by mouth 3 (three) times daily with meals.    [provider]  metFORMIN (GLUCOPHAGE) 500 MG tablet Take 1,000 mg by mouth at bedtime.  01/04/16   [provider]  morphine (MSIR) 30 MG tablet Take 30 mg by mouth daily as needed for severe pain.    [provider]  Multiple Vitamin (MULTIVITAMIN WITH MINERALS) TABS tablet Take 1 tablet by mouth at bedtime.    [provider]  MYRBETRIQ 50 MG TB24 tablet Take 50 mg by mouth daily. 03/29/20   [provider]  Omega-3 Fatty Acids (FISH OIL) 1200 MG CAPS Take 1,200 mg by mouth at bedtime.     [provider]  [facility] VERIO test strip  10/13/18   [provider]  pantoprazole (PROTONIX) 40 MG tablet Take one tablet once every morning Patient taking differently: 80 mg daily. Take one tablet once every morning 10/18/15   Kozlow, Donnamarie Poag, MD  predniSONE (DELTASONE) 5 MG tablet Take 5 mg by mouth daily with breakfast.     [provider]  QVAR REDIHALER 40 MCG/ACT inhaler Inhale into the lungs. 03/16/20   [provider]  traMADol (ULTRAM) 50 MG tablet Take 50 mg by mouth every 8 (eight) hours as needed. 11/09/19   [provider]    Physical Exam: Vitals:   05/22/20 2045 05/22/20 2100 05/22/20 2105 05/22/20 2200  BP:  110/63  (!) 108/43  Pulse: (!) 108 (!) 109  (!) 113  Resp:  20  18  Temp:      TempSrc:      SpO2: (!) 89% (S) (!) 88% (S) 95% 94%  Weight:      Height:        Physical Exam Constitutional:      General: She is not in acute distress.    Appearance: Normal appearance. She is obese.  HENT:     Head: Normocephalic and atraumatic.     Mouth/Throat:     Mouth: Mucous membranes are dry.     Pharynx: Oropharynx is clear.  Eyes:     Extraocular Movements: Extraocular movements intact.     Pupils: Pupils are equal, round, and reactive to light.  Cardiovascular:     Rate and Rhythm: Regular rhythm. Tachycardia present.     Pulses: Normal pulses.     Heart sounds: Normal heart sounds.     Comments: Right lower chest wall tenderness. Pulmonary:     Effort: Pulmonary effort is normal. No respiratory distress.     Breath sounds: Normal breath sounds.  Abdominal:     General: Bowel sounds are normal. There is no distension.     Palpations: Abdomen is soft.     Tenderness: There is no abdominal tenderness.  Musculoskeletal:        General: No swelling or deformity.  Skin:    General: Skin is warm and dry.  Neurological:     General: No focal deficit present.     Mental Status: Mental status is at baseline.    Labs on Admission: I have personally reviewed following labs and imaging studies  CBC: Recent Labs  Lab 05/22/20 1608  WBC 15.2*  HGB 13.6  HCT 45.0  MCV 86.7  PLT 421*    Basic Metabolic Panel: Recent Labs  Lab 05/22/20 1608  NA 139  K 4.6  CL 105  CO2 22  GLUCOSE 202*  BUN 24*  CREATININE 1.01*  CALCIUM 9.0    GFR: Estimated Creatinine Clearance: 48.8 mL/min (A) (by C-G formula based on SCr of 1.01 mg/dL (H)).  Liver Function Tests: Recent Labs  Lab 05/22/20 1800  AST 18  ALT 14  ALKPHOS 69  BILITOT 0.9  PROT 7.0  ALBUMIN 3.2*    Urine analysis:    Component Value Date/Time   COLORURINE YELLOW 05/22/2020 2129  APPEARANCEUR CLEAR 05/22/2020 2129   LABSPEC <1.005 (L) 05/22/2020 2129   PHURINE 5.0 05/22/2020 2129   GLUCOSEU 50 (A) 05/22/2020 2129   GLUCOSEU NEGATIVE 12/24/2016 1239   HGBUR  NEGATIVE 05/22/2020 2129   BILIRUBINUR NEGATIVE 05/22/2020 2129   KETONESUR 5 (A) 05/22/2020 2129   PROTEINUR NEGATIVE 05/22/2020 2129   UROBILINOGEN 0.2 12/24/2016 1239   NITRITE NEGATIVE 05/22/2020 2129   LEUKOCYTESUR NEGATIVE 05/22/2020 2129    Radiological Exams on Admission: DG Chest 2 View  Result Date: 05/22/2020 CLINICAL DATA:  Chest pain. EXAM: CHEST - 2 VIEW COMPARISON:  January 17, 2018. FINDINGS: The heart size and mediastinal contours are within normal limits. Both lungs are clear. No pneumothorax or pleural effusion is noted. The visualized skeletal structures are unremarkable. IMPRESSION: No active cardiopulmonary disease. Electronically Signed   By: Marijo Conception M.D.   On: 05/22/2020 14:18   CT Angio Chest PE W and/or Wo Contrast  Result Date: 05/22/2020 CLINICAL DATA:  Status post fall 2 weeks ago with right-sided chest pain. EXAM: CT ANGIOGRAPHY CHEST WITH CONTRAST TECHNIQUE: Multidetector CT imaging of the chest was performed using the standard protocol during bolus administration of intravenous contrast. Multiplanar CT image reconstructions and MIPs were obtained to evaluate the vascular anatomy. CONTRAST:  111mL OMNIPAQUE IOHEXOL 350 MG/ML SOLN COMPARISON:  January 09, 2017 FINDINGS: Cardiovascular: There is marked severity calcification of the aortic arch without evidence of aneurysmal dilatation or dissection. The subsegmental pulmonary arteries are limited in evaluation secondary to patient motion and areas of overlying artifact. No evidence of pulmonary embolism. Normal heart size with moderate severity coronary artery calcification. No pericardial effusion. Mediastinum/Nodes: No enlarged mediastinal, hilar, or axillary lymph nodes. Thyroid gland, trachea, and esophagus demonstrate no significant findings. Lungs/Pleura: Mild emphysematous lung disease is noted within the bilateral apices. Mild areas of scarring and/or atelectasis are seen along the posterior aspect of the  right lung and left lung base. This is most prominent within the right lower lobe. There is no evidence of a pleural effusion or pneumothorax. Upper Abdomen: A stable 5.1 cm x 4.0 cm well-defined area of parenchymal low attenuation is seen within the spleen. Musculoskeletal: Chronic posterior tenth and eleventh left rib fractures are seen. Multilevel degenerative changes seen throughout the thoracic spine. Other: The study is limited secondary to patient motion. Review of the MIP images confirms the above findings. IMPRESSION: 1. No evidence of pulmonary embolism. 2. Mild bilateral areas of scarring and/or atelectasis, as described above. 3. Chronic posterior tenth and eleventh left rib fractures. 4. Large, stable splenic cyst or hemangioma. 5. Emphysema and aortic atherosclerosis. Aortic Atherosclerosis (ICD10-I70.0) and Emphysema (ICD10-J43.9). Electronically Signed   By: Virgina Norfolk M.D.   On: 05/22/2020 19:16   CT ABDOMEN PELVIS W CONTRAST  Result Date: 05/22/2020 CLINICAL DATA:  Right lower quadrant abdominal pain, fell 2 weeks ago, right chest wall bruising EXAM: CT ABDOMEN AND PELVIS WITH CONTRAST TECHNIQUE: Multidetector CT imaging of the abdomen and pelvis was performed using the standard protocol following bolus administration of intravenous contrast. CONTRAST:  19mL OMNIPAQUE IOHEXOL 350 MG/ML SOLN COMPARISON:  01/18/2018 FINDINGS: Lower chest: Scattered areas of scarring and atelectasis at the lung bases. No acute pleural or parenchymal lung disease. Hepatobiliary: No focal liver abnormality is seen. No gallstones, gallbladder wall thickening, or biliary dilatation. Pancreas: Unremarkable. No pancreatic ductal dilatation or surrounding inflammatory changes. Spleen: Stable 5.2 cm splenic cyst.  Spleen is normal in size. Adrenals/Urinary Tract: Subcentimeter renal cortical hypodensities are stable, most compatible  with cysts. No urinary tract calculi or obstructive uropathy. The bladder is  decompressed, with no gross abnormalities. The adrenals are normal. Stomach/Bowel: No bowel obstruction or ileus. Scattered diverticulosis of the descending and sigmoid colon without diverticulitis. Normal appendix right lower quadrant. No bowel wall thickening or inflammatory change. Vascular/Lymphatic: Aortic atherosclerosis. No enlarged abdominal or pelvic lymph nodes. Reproductive: Status post hysterectomy. No adnexal masses. Other: No free fluid or free gas.  No abdominal wall hernia. Musculoskeletal: No acute or destructive bony lesions. Stable lower lumbar spondylosis. Reconstructed images demonstrate no additional findings. IMPRESSION: 1. No acute intra-abdominal or intrapelvic process. 2. Distal colonic diverticulosis without diverticulitis. 3. Normal appendix. 4. Stable splenic and renal cysts. 5.  Aortic Atherosclerosis (ICD10-I70.0). Electronically Signed   By: Randa Ngo M.D.   On: 05/22/2020 19:10    EKG: Unable to be reviewed due to technical difficulties with EMR  Assessment/Plan Principal Problem:   Acute respiratory failure with hypoxia ([place]) Active Problems:   Asthma   GERD   Rheumatoid arthritis ([place])   Diabetes mellitus due to underlying condition, controlled, with complication, without long-term current use of insulin ([facility])   Chronic kidney disease (CKD), stage III (moderate) ([facility])   Fibromyalgia   Sleep apnea  Right lower chest wall pain Acute respiratory failure with hypoxia > Unclear etiology at this time.  Does have a history of ILD listed in the chart and she reports history of asthma and COPD. Emphysema and scarring on CT here, in the setting of known RA. > No acute infectious etiology to explain symptoms. > Unclear if her fall and trauma may have contributed to this, unlikely. > She does state that she may have been on oxygen in the past for her lung disease versus possibly OSA at night. > This may all be related to her unexplained right lower chest wall pain  causing her to be tachypneic and taking short shallow breaths.  No explanation for this pain on CT scans. - Observe overnight - Wean oxygen as tolerated - Pain medication for chest wall pain  Asthma Chronic COPD > States she only takes her inhalers as needed.  Possibly this could be contributing to her respiratory symptoms > Note, is on daily steroids for her RA - Continue home Qvar and as needed albuterol  OSA > Does not tolerate CPAP -Continue oxygen therapy  Leukocytosis > Likely due to her chronic steroid use.  States she takes 5 mg daily and at times will take 20 mg when she is having increased pain.  She takes she took 20 mg on either Saturday or Sunday. > No sign of infection on CT of chest or abdomen and pelvis.  Normal chest x-ray.  Urinalysis pending. -Follow CBC and fever curve  Fibromyalgia Chronic pain - Continue home duloxetine - Continue home gabapentin -There are no medications for this in her chest wall pain consisting of hydrocodone and Dilaudid for breakthrough pain  Rheumatoid arthritis -Continue home steroids  GERD - Continue home PPI  Diabetes > Takes 20 units twice daily at home - We will give 8 units twice daily - SSI  CKD 3 > Creatinine stable at 1 - Avoid nephrotoxic agents - Trend renal function and electrolytes - Appears dry we will continue IV fluids overnight  DVT prophylaxis: Lovenox Code Status:   Full Family Communication:  None on admission Disposition Plan:   Patient is from:  Home  Anticipated DC to:  Home  Anticipated DC date:  1 to 2 days  Anticipated DC barriers: None  Consults called:  None Admission status:  Observation, telemetry   Severity of Illness: The appropriate patient status for this patient is OBSERVATION. Observation status is judged to be reasonable and necessary in order to provide the required intensity of service to ensure the patient's safety. The patient's presenting symptoms, physical exam findings, and  initial radiographic and laboratory data in the context of their medical condition is felt to place them at decreased risk for further clinical deterioration. Furthermore, it is anticipated that the patient will be medically stable for discharge from the hospital within 2 midnights of admission. The following factors support the patient status of observation.   " The patient's presenting symptoms include chest pain and abdominal pain. " The physical exam findings include obesity, chest wall tenderness. " The initial radiographic and laboratory data are CT a chest with mild scarring and chronic rib fractures and emphysema.  Leukocytosis to 15, platelets 421.   Marcelyn Bruins MD [facility]  How to contact the [facility] Attending or Consulting provider [place] or covering provider during after hours [place], for this patient?   1. Check the care team in [facility] and look for a) attending/consulting [facility] provider listed and b) the [facility] team listed 2. Log into [email or web address] and use [place]'s universal password to access. If you do not have the password, please contact the hospital operator. 3. Locate the [facility] provider you are looking for under [facility] and page to a number that you can be directly reached. 4. If you still have difficulty reaching the provider, please page the [facility] (Director on Call) for the Hospitalists listed on amion for assistance.  05/22/2020, 11:18 PM

## 2020-05-22 NOTE — ED Notes (Signed)
MD at bedside, 02 sats on room air 88%.  2L via  given, sats now 94%.

## 2020-05-22 NOTE — ED Provider Notes (Signed)
[place] DEPT Provider Note   CSN: [id number] Arrival date & time: 05/22/20  1310     History Chief Complaint  Patient presents with  . Fall  . rib cage pain  . Chest Pain    [name] is a 80 y.o. female.  Presenting to ER with concern for chest pain.  Patient reports she fell a couple weeks ago and landed on her right side.  Did not initially suffer from significant pain.  However today, she woke up with some severe right chest pain as well as some right-sided abdominal pain.  Pain is sharp, stabbing, nonradiating.  Relatively constant, but worse with taking deep breaths.  Feels somewhat short of breath.  No pain on the left side of her chest or abdomen.  No fevers, cough.  Has history of asthma, emphysema, prior smoker, diabetes  HPI     Past Medical History:  Diagnosis Date  . Asthma   . Complication of anesthesia    Anesthesia told her years ago she got too cold during surgery  . Diabetes mellitus without complication ([place])   . Emphysema   . Fibromyalgia   . Osteoporosis   . Rheumatoid arthritis(714.0)   . Sleep apnea    no cpap    Patient Active Problem List   Diagnosis Date Noted  . Rotator cuff arthropathy of left shoulder 12/13/2019  . Pain in joint of right shoulder 12/10/2019  . Pain in joint of left shoulder 12/10/2019  . Hearing loss, sensorineural, 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  . Tinnitus, left 01/08/2019  . DM type 2, not at goal [facility]) 06/03/2018  . Frequent infections 05/21/2018  . Gangrene ([place]) 05/21/2018  . Pain in finger 05/07/2018  . Family history of aneurysm 05/07/2018  . Diabetes mellitus due to underlying condition, controlled, with complication, without long-term current use of insulin ([place])   . Lower urinary tract infectious disease   . Palpitations 01/17/2018  . Dehydration 01/17/2018  . Hypotension 01/17/2018  . Symptom of blood in stool 01/17/2018  . Diarrhea in adult patient 01/17/2018  . Presence of  right artificial knee joint 08/21/2017  . Aftercare following joint replacement 08/21/2017  . 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 Closed displaced transverse fracture of right patella 05/06/2017  . Anemia 04/16/2017  . Chronic interstitial lung disease ([place]) 04/16/2017  . Chronic kidney disease (CKD), stage III (moderate) ([place]) 04/16/2017  . Other hyperlipidemia 04/16/2017  . Type 2 diabetes mellitus without complication, without long-term current use of insulin ([place]) 04/16/2017  . Pain in right knee 03/25/2017  . Aftercare 03/25/2017  . Arthritis, septic, knee ([place]) 03/09/2017  . 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 Fibromyalgia 06/04/2016  . High risk medication use 06/04/2016  . Claw toe, acquired, left 01/29/2016  . Rheumatoid arthritis involving both feet with negative rheumatoid factor ([place]) 01/29/2016  . Septic prepatellar bursitis of right knee 10/26/2014  . Prepatellar bursitis of right knee 08/28/2014  . DYSPHAGIA ORAL PHASE 06/18/2007  . Asthma 05/14/2007  . SLEEP APNEA 05/14/2007  . Sleep apnea 05/14/2007  . GERD 05/13/2007  . Rheumatoid arthritis ([place]) 05/13/2007  . OSTEOPOROSIS 05/13/2007  . DIARRHEA, CHRONIC 05/13/2007    Past Surgical History:  Procedure Laterality Date  . ABDOMINAL HYSTERECTOMY    . ANKLE FRACTURE SURGERY  2007  . BREAST SURGERY     mammoplasty and then removed  . FOOT SURGERY  numerous  . I & D EXTREMITY Right 10/26/2014   Procedure: IRRIGATION AND DEBRIDEMENT RIGHT KNEE ;  Surgeon: Gaynelle Arabian, MD;  Location: [facility];  Service: Orthopedics;  Laterality: Right;  . IRRIGATION AND DEBRIDEMENT KNEE Right 03/09/2017   Procedure: IRRIGATION AND DEBRIDEMENT RIGHT KNEE PRE-PATELLAR BURSA;  Surgeon: Susa Day, MD;  Location: [place];  Service: Orthopedics;  Laterality: Right;  . KNEE BURSECTOMY Right 09/16/2014   Procedure:  EXCISON OF PRE PATELLA BURSA RIGHT KNEE ;  Surgeon: Gaynelle Arabian, MD;  Location: [facility];  Service: Orthopedics;  Laterality: Right;  . PARTIAL HYSTERECTOMY  1970  . TOTAL KNEE ARTHROPLASTY  2005 / 2006   x2     OB History   No obstetric history on file.     Family History  Problem Relation Age of Onset  . Hypertension Other   . Diabetes Other   . Stroke Other   . Diabetes Mother   . Diabetes Father   . Diabetes Sister   . Diabetes Maternal Aunt   . Cancer Paternal Uncle   . Cancer Paternal Grandmother   . Breast cancer Paternal Grandmother     Social History   Tobacco Use  . Smoking status: Former Smoker    Quit date: 03/11/1994    Years since quitting: 26.2  . Smokeless tobacco: Never Used  Vaping Use  . Vaping Use: Never used  Substance Use Topics  . Alcohol use: No  . Drug use: No    Home Medications Prior to Admission medications   Medication Sig Start Date End Date Taking? Authorizing Provider  albuterol (VENTOLIN HFA) 108 (90 Base) MCG/ACT inhaler  07/05/19   [provider]  atorvastatin (LIPITOR) 20 MG tablet Take 20 mg by mouth daily. 11/29/19   [provider]  atorvastatin (LIPITOR) 40 MG tablet Take 40 mg by mouth daily at 6 PM.  03/10/18   [provider]  azithromycin (ZITHROMAX) 250 MG tablet Take 250 mg by mouth as directed. 03/16/20   [provider]  B-D UF III MINI PEN NEEDLES 31G X 5 MM MISC SMARTSIG:1 Each SUB-Q 4 Times Daily 10/22/19   [provider]  buPROPion (WELLBUTRIN XL) 150 MG 24 hr tablet bupropion HCl XL 150 mg 24 hr tablet, extended release    [provider]  cholecalciferol (VITAMIN D) 1000 UNITS tablet Take 1,000 Units by mouth at bedtime.     [provider]  clotrimazole-betamethasone (LOTRISONE) cream Apply topically. 10/07/19   [provider]  Cyanocobalamin (B-12) 2500 MCG TABS Take 2,500 mcg by mouth at bedtime.     [provider]  doxycycline  (VIBRA-TABS) 100 MG tablet Take 100 mg by mouth 2 (two) times daily. 03/30/20   [provider]  DULoxetine (CYMBALTA) 60 MG capsule Take 60 mg by mouth at bedtime.     [provider]  ezetimibe (ZETIA) 10 MG tablet Take 10 mg by mouth at bedtime.  11/04/16   [provider]  ferrous sulfate 325 (65 FE) MG tablet Take 325 mg by mouth at bedtime.     [provider]  gabapentin (NEURONTIN) 100 MG capsule Take 100 mg by mouth 3 (three) times daily. 11/17/19   [provider]  hydroxychloroquine (PLAQUENIL) 200 MG tablet Take 400 mg by mouth daily.  11/11/18   [provider]  insulin NPH-regular Human (70-30) 100 UNIT/ML injection Inject 14 Units into the skin 2 (two) times daily with a meal.    [provider]  lipase/protease/amylase (CREON) 12000 units CPEP capsule Take 24,000 Units by mouth 3 (three) times daily with meals.    [provider]  metFORMIN (GLUCOPHAGE) 500 MG tablet Take 1,000 mg by mouth at bedtime.  01/04/16   [provider]  morphine (MSIR) 30 MG tablet Take 30 mg by mouth daily as needed for severe pain.    [provider]  Multiple Vitamin (MULTIVITAMIN WITH MINERALS) TABS tablet Take 1 tablet by mouth at bedtime.    [provider]  MYRBETRIQ 50 MG TB24 tablet Take 50 mg by mouth daily. 03/29/20   [provider]  Omega-3 Fatty Acids (FISH OIL) 1200 MG CAPS Take 1,200 mg by mouth at bedtime.     [provider]  [facility] VERIO test strip  10/13/18   [provider]  pantoprazole (PROTONIX) 40 MG tablet Take one tablet once every morning Patient taking differently: 80 mg daily. Take one tablet once every morning 10/18/15   Kozlow, Donnamarie Poag, MD  predniSONE (DELTASONE) 5 MG tablet Take 5 mg by mouth daily with breakfast.     [provider]  QVAR REDIHALER 40 MCG/ACT inhaler Inhale into the lungs. 03/16/20   [provider]  traMADol (ULTRAM) 50 MG  tablet Take 50 mg by mouth every 8 (eight) hours as needed. 11/09/19   [provider]    Allergies    Penicillins, Sulfamethoxazole-trimethoprim, Meperidine, Sulfa antibiotics, and Sulfonamide derivatives  Review of Systems   Review of Systems  Constitutional: Negative for chills and fever.  HENT: Negative for ear pain and sore throat.   Eyes: Negative for pain and visual disturbance.  Respiratory: Positive for chest tightness and shortness of breath. Negative for cough.   Cardiovascular: Positive for chest pain. Negative for palpitations.  Gastrointestinal: Positive for abdominal pain. Negative for vomiting.  Genitourinary: Negative for dysuria and hematuria.  Musculoskeletal: Negative for arthralgias and back pain.  Skin: Negative for color change and rash.  Neurological: Negative for seizures and syncope.  All other systems reviewed and are negative.   Physical Exam Updated Vital Signs BP 110/63 (BP Location: Left Arm)   Pulse (!) 109   Temp 98.4 F (36.9 C) (Oral)   Resp 20   Ht 5\' 6"  (1.676 m)   Wt 79.4 kg   SpO2 (S) 95%   BMI 28.25 kg/m   Physical Exam Vitals and nursing note reviewed.  Constitutional:      General: She is not in acute distress.    Appearance: She is well-developed.  HENT:     Head: Normocephalic and atraumatic.  Eyes:     Conjunctiva/sclera: Conjunctivae normal.  Cardiovascular:     Rate and Rhythm: Normal rate and regular rhythm.     Heart sounds: No murmur heard.   Pulmonary:     Effort: No respiratory distress.     Comments: Somewhat diminished at bases, no significant wheeze appreciated, mild tachypnea but no distress Chest:     Comments: Tenderness to palpation over right lateral and right anterior chest wall Abdominal:     Comments: Tenderness to palpation in right upper quadrant and right lower quadrant  Musculoskeletal:     Cervical back: Neck supple.     Right lower leg: No edema.     Left lower leg: No edema.   Skin:    General: Skin is warm and dry.     Capillary Refill: Capillary refill takes less than 2 seconds.  Neurological:  General: No focal deficit present.     Mental Status: She is alert.     ED Results / Procedures / Treatments   Labs (all labs ordered are listed, but only abnormal results are displayed) Labs Reviewed  BASIC METABOLIC PANEL - Abnormal; Notable for the following components:      Result Value   Glucose, Bld 202 (*)    BUN 24 (*)    Creatinine, Ser 1.01 (*)    GFR, Estimated 57 (*)    All other components within normal limits  CBC - Abnormal; Notable for the following components:   WBC 15.2 (*)    RBC 5.19 (*)    RDW 15.9 (*)    Platelets 421 (*)    All other components within normal limits  HEPATIC FUNCTION PANEL - Abnormal; Notable for the following components:   Albumin 3.2 (*)    All other components within normal limits  RESP PANEL BY RT-PCR (FLU A&B, COVID) ARPGX2  BRAIN NATRIURETIC PEPTIDE  URINALYSIS, ROUTINE W REFLEX MICROSCOPIC  TROPONIN I (HIGH SENSITIVITY)  TROPONIN I (HIGH SENSITIVITY)    EKG EKG Interpretation  Date/Time:  Monday May 22 2020 13:46:27 EDT Ventricular Rate:  111 PR Interval:    QRS Duration: 103 QT Interval:  346 QTC Calculation: 471 R Axis:   -65 Text Interpretation: Sinus tachycardia Left anterior fascicular block Abnormal R-wave progression, late transition Probable left ventricular hypertrophy 12 Lead; [place] Confirmed by Madalyn Rob [phone]) on 05/22/2020 5:36:01 PM   Radiology DG Chest 2 View  Result Date: 05/22/2020 CLINICAL DATA:  Chest pain. EXAM: CHEST - 2 VIEW COMPARISON:  January 17, 2018. FINDINGS: The heart size and mediastinal contours are within normal limits. Both lungs are clear. No pneumothorax or pleural effusion is noted. The visualized skeletal structures are unremarkable. IMPRESSION: No active cardiopulmonary disease. Electronically Signed   By: Marijo Conception M.D.   On: 05/22/2020  14:18   CT Angio Chest PE W and/or Wo Contrast  Result Date: 05/22/2020 CLINICAL DATA:  Status post fall 2 weeks ago with right-sided chest pain. EXAM: CT ANGIOGRAPHY CHEST WITH CONTRAST TECHNIQUE: Multidetector CT imaging of the chest was performed using the standard protocol during bolus administration of intravenous contrast. Multiplanar CT image reconstructions and MIPs were obtained to evaluate the vascular anatomy. CONTRAST:  144mL OMNIPAQUE IOHEXOL 350 MG/ML SOLN COMPARISON:  January 09, 2017 FINDINGS: Cardiovascular: There is marked severity calcification of the aortic arch without evidence of aneurysmal dilatation or dissection. The subsegmental pulmonary arteries are limited in evaluation secondary to patient motion and areas of overlying artifact. No evidence of pulmonary embolism. Normal heart size with moderate severity coronary artery calcification. No pericardial effusion. Mediastinum/Nodes: No enlarged mediastinal, hilar, or axillary lymph nodes. Thyroid gland, trachea, and esophagus demonstrate no significant findings. Lungs/Pleura: Mild emphysematous lung disease is noted within the bilateral apices. Mild areas of scarring and/or atelectasis are seen along the posterior aspect of the right lung and left lung base. This is most prominent within the right lower lobe. There is no evidence of a pleural effusion or pneumothorax. Upper Abdomen: A stable 5.1 cm x 4.0 cm well-defined area of parenchymal low attenuation is seen within the spleen. Musculoskeletal: Chronic posterior tenth and eleventh left rib fractures are seen. Multilevel degenerative changes seen throughout the thoracic spine. Other: The study is limited secondary to patient motion. Review of the MIP images confirms the above findings. IMPRESSION: 1. No evidence of pulmonary embolism. 2. Mild bilateral areas of scarring and/or  atelectasis, as described above. 3. Chronic posterior tenth and eleventh left rib fractures. 4. Large, stable  splenic cyst or hemangioma. 5. Emphysema and aortic atherosclerosis. Aortic Atherosclerosis (ICD10-I70.0) and Emphysema (ICD10-J43.9). Electronically Signed   By: Virgina Norfolk M.D.   On: 05/22/2020 19:16   CT ABDOMEN PELVIS W CONTRAST  Result Date: 05/22/2020 CLINICAL DATA:  Right lower quadrant abdominal pain, fell 2 weeks ago, right chest wall bruising EXAM: CT ABDOMEN AND PELVIS WITH CONTRAST TECHNIQUE: Multidetector CT imaging of the abdomen and pelvis was performed using the standard protocol following bolus administration of intravenous contrast. CONTRAST:  142mL OMNIPAQUE IOHEXOL 350 MG/ML SOLN COMPARISON:  01/18/2018 FINDINGS: Lower chest: Scattered areas of scarring and atelectasis at the lung bases. No acute pleural or parenchymal lung disease. Hepatobiliary: No focal liver abnormality is seen. No gallstones, gallbladder wall thickening, or biliary dilatation. Pancreas: Unremarkable. No pancreatic ductal dilatation or surrounding inflammatory changes. Spleen: Stable 5.2 cm splenic cyst.  Spleen is normal in size. Adrenals/Urinary Tract: Subcentimeter renal cortical hypodensities are stable, most compatible with cysts. No urinary tract calculi or obstructive uropathy. The bladder is decompressed, with no gross abnormalities. The adrenals are normal. Stomach/Bowel: No bowel obstruction or ileus. Scattered diverticulosis of the descending and sigmoid colon without diverticulitis. Normal appendix right lower quadrant. No bowel wall thickening or inflammatory change. Vascular/Lymphatic: Aortic atherosclerosis. No enlarged abdominal or pelvic lymph nodes. Reproductive: Status post hysterectomy. No adnexal masses. Other: No free fluid or free gas.  No abdominal wall hernia. Musculoskeletal: No acute or destructive bony lesions. Stable lower lumbar spondylosis. Reconstructed images demonstrate no additional findings. IMPRESSION: 1. No acute intra-abdominal or intrapelvic process. 2. Distal colonic  diverticulosis without diverticulitis. 3. Normal appendix. 4. Stable splenic and renal cysts. 5.  Aortic Atherosclerosis (ICD10-I70.0). Electronically Signed   By: Randa Ngo M.D.   On: 05/22/2020 19:10    Procedures .Critical Care Performed by: Lucrezia Starch, MD Authorized by: Lucrezia Starch, MD   Critical care provider statement:    Critical care time (minutes):  41   Critical care was necessary to treat or prevent imminent or life-threatening deterioration of the following conditions:  Respiratory failure   Critical care was time spent personally by me on the following activities:  Discussions with consultants, evaluation of patient's response to treatment, examination of patient, ordering and performing treatments and interventions, ordering and review of laboratory studies, ordering and review of radiographic studies, pulse oximetry, re-evaluation of patient's condition, obtaining history from patient or surrogate and review of old charts     Medications Ordered in ED Medications  albuterol (VENTOLIN HFA) 108 (90 Base) MCG/ACT inhaler 4 puff (has no administration in time range)  fentaNYL (SUBLIMAZE) injection 50 mcg (50 mcg Intravenous Given 05/22/20 1812)  sodium chloride 0.9 % bolus 1,000 mL (0 mLs Intravenous Stopped 05/22/20 2035)  iohexol (OMNIPAQUE) 350 MG/ML injection 100 mL (100 mLs Intravenous Contrast Given 05/22/20 1842)  HYDROmorphone (DILAUDID) injection 0.5 mg (0.5 mg Intravenous Given 05/22/20 2051)    ED Course  I have reviewed the triage vital signs and the nursing notes.  Pertinent labs & imaging results that were available during my care of the patient were reviewed by me and considered in my medical decision making (see chart for details).    MDM Rules/Calculators/A&P                          80 year old lady presents to ER with concern for right-sided chest pain.  On exam, patient noted to be borderline hypoxic, tachycardic, tenderness over her  right anterior chest wall as well as the right side of her abdomen.  Completed broad work-up, CT a chest was negative for PE, did demonstrate some scarring versus atelectasis at the bases.  No pneumonia.  CT abdomen pelvis was negative.  Her LFTs were stable.  Etiology for her pain, tachycardia and hypoxia not clear at this time.  Given need for ongoing pain medicine and her mild hypoxia, believe she would benefit from admission for observation and further work-up.  We will consult the hospitalist service for further management.  Final Clinical Impression(s) / ED Diagnoses Final diagnoses:  Chest pain, unspecified type  Hypoxia  Acute respiratory failure with hypoxia [facility])    Rx / DC Orders ED Discharge Orders    None       Lucrezia Starch, MD 05/22/20 2132

## 2020-05-22 NOTE — ED Triage Notes (Addendum)
Per EMS- Patient is from home and lives alone. Patient reports a fall approx 2 weeks ago.   Patient has brusing to the right rib cage area under the right breast and states the pain started today.   Patient added in triage that she had a sudden onset of right chest pain this AM. Pain is worse when taking a deep breath.

## 2020-05-23 DIAGNOSIS — Z87891 Personal history of nicotine dependence: Secondary | ICD-10-CM | POA: Diagnosis not present

## 2020-05-23 DIAGNOSIS — R0789 Other chest pain: Secondary | ICD-10-CM | POA: Diagnosis present

## 2020-05-23 DIAGNOSIS — J849 Interstitial pulmonary disease, unspecified: Secondary | ICD-10-CM | POA: Diagnosis not present

## 2020-05-23 DIAGNOSIS — Z880 Allergy status to penicillin: Secondary | ICD-10-CM | POA: Diagnosis not present

## 2020-05-23 DIAGNOSIS — Z20822 Contact with and (suspected) exposure to covid-19: Secondary | ICD-10-CM | POA: Diagnosis present

## 2020-05-23 DIAGNOSIS — M797 Fibromyalgia: Secondary | ICD-10-CM | POA: Diagnosis not present

## 2020-05-23 DIAGNOSIS — J45909 Unspecified asthma, uncomplicated: Secondary | ICD-10-CM | POA: Diagnosis not present

## 2020-05-23 DIAGNOSIS — Z882 Allergy status to sulfonamides status: Secondary | ICD-10-CM | POA: Diagnosis not present

## 2020-05-23 DIAGNOSIS — R0902 Hypoxemia: Secondary | ICD-10-CM | POA: Diagnosis not present

## 2020-05-23 DIAGNOSIS — G4733 Obstructive sleep apnea (adult) (pediatric): Secondary | ICD-10-CM | POA: Diagnosis present

## 2020-05-23 DIAGNOSIS — E785 Hyperlipidemia, unspecified: Secondary | ICD-10-CM | POA: Diagnosis not present

## 2020-05-23 DIAGNOSIS — M069 Rheumatoid arthritis, unspecified: Secondary | ICD-10-CM | POA: Diagnosis not present

## 2020-05-23 DIAGNOSIS — Z6828 Body mass index (BMI) 28.0-28.9, adult: Secondary | ICD-10-CM | POA: Diagnosis not present

## 2020-05-23 DIAGNOSIS — H9190 Unspecified hearing loss, unspecified ear: Secondary | ICD-10-CM | POA: Diagnosis not present

## 2020-05-23 DIAGNOSIS — E1122 Type 2 diabetes mellitus with diabetic chronic kidney disease: Secondary | ICD-10-CM | POA: Diagnosis not present

## 2020-05-23 DIAGNOSIS — F329 Major depressive disorder, single episode, unspecified: Secondary | ICD-10-CM | POA: Diagnosis not present

## 2020-05-23 DIAGNOSIS — Z8261 Family history of arthritis: Secondary | ICD-10-CM | POA: Diagnosis not present

## 2020-05-23 DIAGNOSIS — Z833 Family history of diabetes mellitus: Secondary | ICD-10-CM | POA: Diagnosis not present

## 2020-05-23 DIAGNOSIS — K219 Gastro-esophageal reflux disease without esophagitis: Secondary | ICD-10-CM | POA: Diagnosis present

## 2020-05-23 DIAGNOSIS — Z888 Allergy status to other drugs, medicaments and biological substances status: Secondary | ICD-10-CM | POA: Diagnosis not present

## 2020-05-23 DIAGNOSIS — I700 Atherosclerosis of aorta: Secondary | ICD-10-CM | POA: Diagnosis not present

## 2020-05-23 DIAGNOSIS — Z743 Need for continuous supervision: Secondary | ICD-10-CM | POA: Diagnosis not present

## 2020-05-23 DIAGNOSIS — K8681 Exocrine pancreatic insufficiency: Secondary | ICD-10-CM | POA: Diagnosis not present

## 2020-05-23 DIAGNOSIS — Z794 Long term (current) use of insulin: Secondary | ICD-10-CM | POA: Diagnosis not present

## 2020-05-23 DIAGNOSIS — N1831 Chronic kidney disease, stage 3a: Secondary | ICD-10-CM | POA: Diagnosis present

## 2020-05-23 DIAGNOSIS — E1151 Type 2 diabetes mellitus with diabetic peripheral angiopathy without gangrene: Secondary | ICD-10-CM | POA: Diagnosis not present

## 2020-05-23 DIAGNOSIS — I959 Hypotension, unspecified: Secondary | ICD-10-CM | POA: Diagnosis not present

## 2020-05-23 DIAGNOSIS — D631 Anemia in chronic kidney disease: Secondary | ICD-10-CM | POA: Diagnosis not present

## 2020-05-23 DIAGNOSIS — D649 Anemia, unspecified: Secondary | ICD-10-CM | POA: Diagnosis present

## 2020-05-23 DIAGNOSIS — M810 Age-related osteoporosis without current pathological fracture: Secondary | ICD-10-CM | POA: Diagnosis present

## 2020-05-23 DIAGNOSIS — Z9181 History of falling: Secondary | ICD-10-CM | POA: Diagnosis not present

## 2020-05-23 DIAGNOSIS — M47812 Spondylosis without myelopathy or radiculopathy, cervical region: Secondary | ICD-10-CM | POA: Diagnosis present

## 2020-05-23 DIAGNOSIS — E669 Obesity, unspecified: Secondary | ICD-10-CM | POA: Diagnosis present

## 2020-05-23 DIAGNOSIS — Z90711 Acquired absence of uterus with remaining cervical stump: Secondary | ICD-10-CM | POA: Diagnosis not present

## 2020-05-23 DIAGNOSIS — G8929 Other chronic pain: Secondary | ICD-10-CM | POA: Diagnosis not present

## 2020-05-23 DIAGNOSIS — J9601 Acute respiratory failure with hypoxia: Principal | ICD-10-CM

## 2020-05-23 DIAGNOSIS — N183 Chronic kidney disease, stage 3 unspecified: Secondary | ICD-10-CM | POA: Diagnosis not present

## 2020-05-23 DIAGNOSIS — J439 Emphysema, unspecified: Secondary | ICD-10-CM | POA: Diagnosis not present

## 2020-05-23 DIAGNOSIS — K529 Noninfective gastroenteritis and colitis, unspecified: Secondary | ICD-10-CM | POA: Diagnosis present

## 2020-05-23 DIAGNOSIS — D801 Nonfamilial hypogammaglobulinemia: Secondary | ICD-10-CM | POA: Diagnosis not present

## 2020-05-23 DIAGNOSIS — R279 Unspecified lack of coordination: Secondary | ICD-10-CM | POA: Diagnosis not present

## 2020-05-23 LAB — COMPREHENSIVE METABOLIC PANEL
ALT: 12 U/L (ref 0–44)
AST: 10 U/L — ABNORMAL LOW (ref 15–41)
Albumin: 2.9 g/dL — ABNORMAL LOW (ref 3.5–5.0)
Alkaline Phosphatase: 57 U/L (ref 38–126)
Anion gap: 10 (ref 5–15)
BUN: 22 mg/dL (ref 8–23)
CO2: 20 mmol/L — ABNORMAL LOW (ref 22–32)
Calcium: 7.7 mg/dL — ABNORMAL LOW (ref 8.9–10.3)
Chloride: 104 mmol/L (ref 98–111)
Creatinine, Ser: 0.94 mg/dL (ref 0.44–1.00)
GFR, Estimated: >60 mL/min (ref >60)
Glucose, Bld: 198 mg/dL — ABNORMAL HIGH (ref 70–99)
Potassium: 4.2 mmol/L (ref 3.5–5.1)
Sodium: 134 mmol/L — ABNORMAL LOW (ref 135–145)
Total Bilirubin: 1 mg/dL (ref 0.3–1.2)
Total Protein: 5.9 g/dL — ABNORMAL LOW (ref 6.5–8.1)

## 2020-05-23 LAB — CBG MONITORING, ED: Glucose-Capillary: 196 mg/dL — ABNORMAL HIGH (ref 70–99)

## 2020-05-23 LAB — GLUCOSE, CAPILLARY
Glucose-Capillary: 186 mg/dL — ABNORMAL HIGH (ref 70–99)
Glucose-Capillary: 163 mg/dL — ABNORMAL HIGH (ref 70–99)
Glucose-Capillary: 245 mg/dL — ABNORMAL HIGH (ref 70–99)

## 2020-05-23 LAB — CBC
HCT: 35.6 % — ABNORMAL LOW (ref 36.0–46.0)
Hemoglobin: 10.9 g/dL — ABNORMAL LOW (ref 12.0–15.0)
MCH: 26.3 pg (ref 26.0–34.0)
MCHC: 30.6 g/dL (ref 30.0–36.0)
MCV: 85.8 fL (ref 80.0–100.0)
Platelets: 304 10*3/uL (ref 150–400)
RBC: 4.15 MIL/uL (ref 3.87–5.11)
RDW: 16.1 % — ABNORMAL HIGH (ref 11.5–15.5)
WBC: 14.3 10*3/uL — ABNORMAL HIGH (ref 4.0–10.5)
nRBC: 0 % (ref 0.0–0.2)

## 2020-05-23 LAB — RESP PANEL BY RT-PCR (FLU A&B, COVID) ARPGX2
Influenza A by PCR: NEGATIVE
Influenza B by PCR: NEGATIVE
SARS Coronavirus 2 by RT PCR: NEGATIVE

## 2020-05-23 MED ORDER — VITAMIN B-12 1000 MCG PO TABS
2500.0000 ug | ORAL_TABLET | Freq: Every day | ORAL | Status: DC
  Administered 2020-05-23 – 2020-05-25 (×3): 2500 ug via ORAL
  Filled 2020-05-23 (×3): qty 3

## 2020-05-23 MED ORDER — MIRABEGRON ER 25 MG PO TB24
50.0000 mg | ORAL_TABLET | Freq: Every day | ORAL | Status: DC
  Administered 2020-05-23 – 2020-05-26 (×4): 50 mg via ORAL
  Filled 2020-05-23 (×4): qty 2

## 2020-05-23 MED ORDER — INSULIN ASPART PROT & ASPART (70-30 MIX) 100 UNIT/ML ~~LOC~~ SUSP
10.0000 [IU] | Freq: Two times a day (BID) | SUBCUTANEOUS | Status: DC
  Administered 2020-05-23 – 2020-05-25 (×4): 10 [IU] via SUBCUTANEOUS
  Filled 2020-05-23: qty 10

## 2020-05-23 MED ORDER — FERROUS SULFATE 325 (65 FE) MG PO TABS
325.0000 mg | ORAL_TABLET | Freq: Every day | ORAL | Status: DC
  Administered 2020-05-23 – 2020-05-25 (×3): 325 mg via ORAL
  Filled 2020-05-23 (×3): qty 1

## 2020-05-23 MED ORDER — IPRATROPIUM-ALBUTEROL 0.5-2.5 (3) MG/3ML IN SOLN: 3.0000 mL | Freq: Four times a day (QID) | RESPIRATORY_TRACT | Status: DC | PRN

## 2020-05-23 MED ORDER — ARFORMOTEROL TARTRATE 15 MCG/2ML IN NEBU
15.0000 ug | INHALATION_SOLUTION | Freq: Two times a day (BID) | RESPIRATORY_TRACT | Status: DC
  Administered 2020-05-23 – 2020-05-26 (×6): 15 ug via RESPIRATORY_TRACT
  Filled 2020-05-23 (×8): qty 2

## 2020-05-23 NOTE — ED Notes (Signed)
No nurse to nurse report given - chart reviewed by this RN

## 2020-05-23 NOTE — Consult Note (Signed)
NAME:  [name], MRN:  [id number], DOB:  Apr 30, 1940, LOS: 0 ADMISSION DATE:  05/22/2020, CONSULTATION DATE:  05/23/20 REFERRING MD:  [facility] CHIEF COMPLAINT:  pain   Brief History:  80 year old with RA on chronic prednisone formally followed in [facility] by Dr. Vaughan Browner without evidence of significant ILD in the past who presents with right upper quadrant/right chest wall pain and shortness of breath with new hypoxemia.  History of Present Illness:  Most recent pulmonary notes x2 reviewed, most recent 2018.  Patient with long history family history of RA.  Chronic joint changes in fingers and wrist.  Previous seen by pulmonary.  PFTs in 2018 on my interpretation revealed normal spirometry, no bronchodilator response, normal lung volumes, moderate reduced DLCO.  She has had multiple CT chest in the past most recent [facility] reviewed (2019 CTA images not available despite being in our EMR) from 2018 appears to be high as supine and prone which demonstrates emphysematous changes, bronchiectasis in bilateral bases, no evidence of interstitial lung disease.  She had a CT PE protocol yesterday which on my interpretation shows low lung volumes (expiratory film) bilateral lower lobe dependent linear infiltrates consistent with atelectasis, no evidence of bronchiectasis, dilated esophagus proximally, emphysematous changes in the apices.  No evidence of PE.  Reviewed PFTs most recent 2018 which are normal with the exception of moderately reduced DLCO.  She describes onset of shortness of breath associated with her right chest pain/right upper quadrant pain.  Prior to that, she had no increase or worsening shortness of breath or dyspnea on exertion.  No cough.  Joint disease seems at baseline.  She endorses some nausea, no frank emesis.  No diarrhea.  She can identify no alleviating or exacerbating factors for the pain prior to presentation.  Not worse with eating or drinking.  No timing during the day were  better or worse.  No environmental or seasonal changes.  She is been getting pain medications which is helped chest pain.  Past Medical History:  RA on prednisone 5 mg daily, asthma, diabetes  Significant Hospital Events:  3/14 admitted 3/15 PCCM consult  Consults:  PCCM  Procedures:  N/a  Significant Diagnostic Tests:  CTA PE protocol 3/14, interpretation reveals bilateral lower lobe dependent atelectatic changes, no PE, no evidence of ILD or other infiltrate  Micro Data:  N/A  Antimicrobials:  N/A  Interim History / Subjective:  As above  Objective   Blood pressure 115/61, pulse 98, temperature 99.6 F (37.6 C), temperature source Oral, resp. rate 20, height 5\' 6"  (1.676 m), weight 79.4 kg, SpO2 93 %.       No intake or output data in the 24 hours ending 05/23/20 1210 Filed Weights   05/22/20 1318  Weight: 79.4 kg    Examination: General: Chronically ill-appearing, in no acute distress Eyes: EOMI no icterus Neck: Supple, no JVP appreciated Lungs: Clear to auscultation bilaterally, no wheeze or crackle Cardiovascular: tachy, no murmurs Abdomen: ND, BS present, palpation of right upper quadrant with mild pain MSK: chronic changes from RA in bilateral hands, tenderness to light palpation on the right mid to upper chest wall mid clavicular line Neuro: No weakness, sensation intact Psych: normal mood, full affect  Resolved Hospital Problem list   n/a  Assessment & Plan:  Acute Hypoxemic Respiratory Failure: Clinical history and images consistent with hypoventilation in the setting of RUQ vs R chest wall pain with resultant atelectasis in bilateral bases causing shunt physiology for which she  can not adequally VQ match given underlying emphysema. Images not consistent with RA-ILD (usually UIP pattern). Symptoms did not occur prior to pain limiting ventilation. --Incentive spirometer --OOB to chair --Ambulate as able --Pain control per primary  Emphysema/Asthma:  PFTs 2018 without fixed obstruction. DLCO moderately reduced. --Budesonide neb BID, add formoterol neb BID, Duonebs PRN  Best practice (evaluated daily)  Per primary  Goals of Care:  Per Primary  Labs   CBC: Recent Labs  Lab 05/22/20 1608 05/23/20 0450  WBC 15.2* 14.3*  HGB 13.6 10.9*  HCT 45.0 35.6*  MCV 86.7 85.8  PLT 421* 938    Basic Metabolic Panel: Recent Labs  Lab 05/22/20 1608 05/23/20 0450  NA 139 134*  K 4.6 4.2  CL 105 104  CO2 22 20*  GLUCOSE 202* 198*  BUN 24* 22  CREATININE 1.01* 0.94  CALCIUM 9.0 7.7*   GFR: Estimated Creatinine Clearance: 52.4 mL/min (by C-G formula based on SCr of 0.94 mg/dL). Recent Labs  Lab 05/22/20 1608 05/23/20 0450  WBC 15.2* 14.3*    Liver Function Tests: Recent Labs  Lab 05/22/20 1800 05/23/20 0450  AST 18 10*  ALT 14 12  ALKPHOS 69 57  BILITOT 0.9 1.0  PROT 7.0 5.9*  ALBUMIN 3.2* 2.9*   No results for input(s): LIPASE, AMYLASE in the last 168 hours. No results for input(s): AMMONIA in the last 168 hours.  ABG No results found for: PHART, PCO2ART, PO2ART, HCO3, TCO2, ACIDBASEDEF, O2SAT   Coagulation Profile: No results for input(s): INR, PROTIME in the last 168 hours.  Cardiac Enzymes: No results for input(s): CKTOTAL, CKMB, CKMBINDEX, TROPONINI in the last 168 hours.  HbA1C: No results found for: HGBA1C  CBG: Recent Labs  Lab 05/23/20 0744 05/23/20 1137  GLUCAP 196* 186*    Review of Systems:   No orthopnea or PND. No LE swelling. Comprehensive ROS otherwise negative.   Past Medical History:  She,  has a past medical history of Asthma, Complication of anesthesia, Diabetes mellitus without complication ([place]), Emphysema, Fibromyalgia, Osteoporosis, Rheumatoid arthritis(714.0), and Sleep apnea.   Surgical History:   Past Surgical History:  Procedure Laterality Date  . ABDOMINAL HYSTERECTOMY    . ANKLE FRACTURE SURGERY  2007  . BREAST SURGERY     mammoplasty and then removed  . FOOT  SURGERY     numerous  . I & D EXTREMITY Right 10/26/2014   Procedure: IRRIGATION AND DEBRIDEMENT RIGHT KNEE ;  Surgeon: Gaynelle Arabian, MD;  Location: [facility];  Service: Orthopedics;  Laterality: Right;  . IRRIGATION AND DEBRIDEMENT KNEE Right 03/09/2017   Procedure: IRRIGATION AND DEBRIDEMENT RIGHT KNEE PRE-PATELLAR BURSA;  Surgeon: Susa Day, MD;  Location: [facility];  Service: Orthopedics;  Laterality: Right;  . KNEE BURSECTOMY Right 09/16/2014   Procedure: EXCISON OF PRE PATELLA BURSA RIGHT KNEE ;  Surgeon: Gaynelle Arabian, MD;  Location: [facility];  Service: Orthopedics;  Laterality: Right;  . PARTIAL HYSTERECTOMY  1970  . TOTAL KNEE ARTHROPLASTY  2005 / 2006   x2     Social History:   reports that she quit smoking about 26 years ago. She has never used smokeless tobacco. She reports that she does not drink alcohol and does not use drugs.   Family History:  Her family history includes Breast cancer in her paternal grandmother; Cancer in her paternal grandmother and paternal uncle; Diabetes in her father, maternal aunt, mother, sister, and another family member; Hypertension in an other family member; Stroke in an other  family member.   Allergies Allergies  Allergen Reactions  . Penicillins Hives    Has patient had a PCN reaction causing immediate rash, facial/tongue/throat swelling, SOB or lightheadedness with hypotension: Yes Has patient had a PCN reaction causing severe rash involving mucus membranes or skin necrosis: No Has patient had a PCN reaction that required hospitalization: No Has patient had a PCN reaction occurring within the last 10 years: No If all of the above answers are "NO", then may proceed with Cephalosporin use. Patient has tolerated ceftriaxone in the recent past.  . Sulfamethoxazole-Trimethoprim Nausea And Vomiting    REACTION: stomach problems  . Meperidine Other (See Comments)  . Sulfa Antibiotics Nausea And Vomiting    REACTION: stomach problems  . Sulfonamide  Derivatives Nausea And Vomiting    REACTION: stomach problems     Home Medications  Prior to Admission medications   Medication Sig Start Date End Date Taking? Authorizing Provider  albuterol (VENTOLIN HFA) 108 (90 Base) MCG/ACT inhaler  07/05/19  Yes [provider]  atorvastatin (LIPITOR) 20 MG tablet Take 20 mg by mouth daily. 11/29/19   [provider]  B-D UF III MINI PEN NEEDLES 31G X 5 MM MISC SMARTSIG:1 Each SUB-Q 4 Times Daily 10/22/19   [provider]  cholecalciferol (VITAMIN D) 1000 UNITS tablet Take 1,000 Units by mouth at bedtime.     [provider]  clotrimazole-betamethasone (LOTRISONE) cream Apply topically. 10/07/19   [provider]  Cyanocobalamin (B-12) 2500 MCG TABS Take 2,500 mcg by mouth at bedtime.     [provider]  DULoxetine (CYMBALTA) 60 MG capsule Take 60 mg by mouth at bedtime.     [provider]  ezetimibe (ZETIA) 10 MG tablet Take 10 mg by mouth at bedtime.  11/04/16   [provider]  ferrous sulfate 325 (65 FE) MG tablet Take 325 mg by mouth at bedtime.     [provider]  gabapentin (NEURONTIN) 100 MG capsule Take 100 mg by mouth 3 (three) times daily. 11/17/19   [provider]  insulin NPH-regular Human (70-30) 100 UNIT/ML injection Inject 14 Units into the skin 2 (two) times daily with a meal.    [provider]  Multiple Vitamin (MULTIVITAMIN WITH MINERALS) TABS tablet Take 1 tablet by mouth at bedtime.    [provider]  MYRBETRIQ 50 MG TB24 tablet Take 50 mg by mouth daily. 03/29/20   [provider]  Omega-3 Fatty Acids (FISH OIL) 1200 MG CAPS Take 1,200 mg by mouth at bedtime.     [provider]  [facility] VERIO test strip  10/13/18   [provider]  predniSONE (DELTASONE) 5 MG tablet Take 5 mg by mouth daily with breakfast.  Patient not taking: Reported on 05/22/2020    [provider]  QVAR REDIHALER 40  MCG/ACT inhaler Inhale into the lungs. 03/16/20   [provider]     Critical care time: n/a

## 2020-05-23 NOTE — Progress Notes (Signed)
Patient arrives to room 1522 at this time from ED via stretcher.

## 2020-05-23 NOTE — Evaluation (Signed)
Physical Therapy Evaluation Patient Details Name: [name] MRN: [id number] DOB: 17-Jul-1940 Today's Date: 05/23/2020   History of Present Illness  80 year old with RA on chronic prednisone without evidence of significant ILD in the past who presents with right upper quadrant/right chest wall pain and shortness of breath with new hypoxemia. 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  Clinical Impression  Pt admitted with above diagnosis. Min assist for sit to stand, min guard assist to pivot to bedside commode then to recliner. SaO2 84% on room air with 3/4 dyspnea during transfers. Pt reports she is independent with bathing/dressing at home, and independent with mobility using her motorized WC, she ambulates only very short distances with a RW at baseline. If pt needs to DC home with home O2, there would be a concern over whether pt could safely manage O2 with her functional limitations. She may need ST-SNF if she and family cannot manage home O2 with a WC being her primary mode of mobility.  Pt currently with functional limitations due to the deficits listed below (see PT Problem List). Pt will benefit from skilled PT to increase their independence and safety with mobility to allow discharge to the venue listed below.       Follow Up Recommendations SNF    Equipment Recommendations  None recommended by PT    Recommendations for Other Services       Precautions / Restrictions Precautions Precautions: Fall Precaution Comments: pt reports 1 fall in the past 1 year Restrictions Weight Bearing Restrictions: No      Mobility  Bed Mobility Overal bed mobility: Modified Independent             General bed mobility comments: HOB up (pt has adjustable bed at home), used rail    Transfers Overall transfer level: Needs assistance Equipment used: None Transfers: Sit to/from [employer] Sit to Stand: Min assist;From elevated surface Stand pivot transfers: Min guard       General transfer comment: stand pivot transfer x 2 (bed to 3 in 1 then to recliner) with min A to power up and min/guard for safety during pivot; SaO2 84% on room air during transfer with 3/4 dyspnea, applied 3L O2 and SaO2 came up to 91% after 2 minutes seated rest with verbal cues for pursed lip breathing. Relied on BUE support (on 3 in 1 armrests, then on recliner armrests) during stand pivot transfers.  Ambulation/Gait             General Gait Details: deferred 2* dyspnea/hypoxia with transfers  Stairs            Wheelchair Mobility    Modified Rankin (Stroke Patients Only)       Balance Overall balance assessment: Needs assistance Sitting-balance support: Feet supported Sitting balance-Leahy Scale: Fair     Standing balance support: Bilateral upper extremity supported Standing balance-Leahy Scale: Poor Standing balance comment: relies on BUE support                             Pertinent Vitals/Pain Pain Assessment: No/denies pain    Home Living Family/patient expects to be discharged to:: Private residence Living Arrangements: Alone Available Help at Discharge: Family;Available PRN/intermittently   Home Access: Ramped entrance     Home Layout: One level Home Equipment: Wheelchair - power;Walker - 4 wheels      Prior  Function Level of Independence: Independent with assistive device(s)         Comments: son lives across the street, uses motorized [employer], walks only very short distances with RW; independent with bathing/dressing, drives and uses motorized cart at grocery store     Hand Dominance        Extremity/Trunk Assessment   Upper Extremity Assessment Upper Extremity Assessment: Overall WFL for tasks assessed (RA deformities B hands noted)    Lower Extremity Assessment Lower Extremity Assessment: Overall WFL for tasks assessed     Cervical / Trunk Assessment Cervical / Trunk Assessment: Normal  Communication   Communication: No difficulties  Cognition Arousal/Alertness: Awake/alert Behavior During Therapy: WFL for tasks assessed/performed Overall Cognitive Status: Within Functional Limits for tasks assessed                                        General Comments      Exercises     Assessment/Plan    PT Assessment Patient needs continued PT services  PT Problem List Decreased activity tolerance;Cardiopulmonary status limiting activity;Decreased balance       PT Treatment Interventions DME instruction;Therapeutic activities;Therapeutic exercise;Gait training;Patient/family education    PT Goals (Current goals can be found in the Care Plan section)  Acute Rehab PT Goals Patient Stated Goal: to go home PT Goal Formulation: With patient Time For Goal Achievement: 06/06/20 Potential to Achieve Goals: Good    Frequency Min 3X/week   Barriers to discharge        Co-evaluation               AM-PAC PT "6 Clicks" Mobility  Outcome Measure Help needed turning from your back to your side while in a flat bed without using bedrails?: A Little Help needed moving from lying on your back to sitting on the side of a flat bed without using bedrails?: A Lot Help needed moving to and from a bed to a chair (including a wheelchair)?: A Little Help needed standing up from a chair using your arms (e.g., wheelchair or bedside chair)?: A Little Help needed to walk in hospital room?: Total Help needed climbing 3-5 steps with a railing? : Total 6 Click Score: 13    End of Session Equipment Utilized During Treatment: Oxygen Activity Tolerance: Treatment limited secondary to medical complications (Comment) (hypoxia with activity) Patient left: in chair;with call bell/phone within reach;with chair alarm set Nurse Communication: Mobility status PT Visit Diagnosis: Difficulty in walking, not  elsewhere classified (R26.2)    Time: 9169-4503 PT Time Calculation (min) (ACUTE ONLY): 22 min   Charges:   PT Evaluation $PT Eval Low Complexity: 1 Low         Philomena Doheny PT 05/23/2020  Acute Rehabilitation Services Pager [phone] Office [phone]

## 2020-05-23 NOTE — Progress Notes (Signed)
PROGRESS NOTE    MIKAELA HILGEMAN  LTJ:[id number] DOB: 10-11-1940 DOA: 05/22/2020 PCP: [name], MD   Chief Complaint  Patient presents with  . Fall  . rib cage pain  . Chest Pain  Brief Narrative: 80 year old female with acquired hypogammaglobinemia, anemia, asthma/chronic lung disease, CKD stage 3, diabetes, dysphagia, fibromyalgia,chronic pain, GERD, hearing loss, RA, OSA admitted with chest pain abdominal pain, with history of fall 3 to 4 weeks ago on the right side, no pain initially but woke up 3/15 with severe right-sided pain on her chest and right abdominal wall.  Has had some shortness of breath, chronic diarrhea but no fever nausea vomiting. In the ED,noted to be hypoxic 88% needing 2 L oxygen to maintain saturation in low to mid 90s, tachycardic in 100 intermittent tachypneic blood pressure stable blood work fairly stable except for leukocytosis, showed CMP with BUN 24,glucose 202, albumin 3.2.CBC with leukocytosis to 15, platelets 421.Troponin negative x2.  BNP normal.Respiratory panel flu COVID-negative,UA glucose 50 ketones 5,chest x-ray no acute finding underwent CTA chest and at abd/pelvis w/ contrast-no PE, mild bilateral scaring and or atelectasis, chronic posterior 10th and 11th left rib fractures, emphysema.large stable splenic cyst hemangioma.  Subjective: Seen and examined this morning in the ED On 3l [place] this morning saturating 94%-wean down to 2 L. Patient reports pain has improved since getting pain medication Had some pain on the back of the neck but appears chronic  Assessment & Plan:  Right lower chest wall pain Acute respiratory failure with hypoxia Asthma Chronic COPD OSA Unclear etiology of hypoxia/pain. But does have chronic lung history asthma/emphysema/OSA- recent history of fall few weeks ago but no obvious fracture to explain pain.  CTA negative for PE, troponin negative x2 BNP normal.  Chest pain seems to be improved on pain medication, patient feels  somewhat better with palpation on the right chest mild tenderness on the cervical area which is chronic neck pain. Will get pulmonary evaluation given her hypoxia does have crackles on exam question ILD in the setting of RA No  Wheezing on exam.  Continue her Pulmicort, she is on a steroid for RA and will continue it.  Cervical DJD/Neck pain- chronic. RA on chronic steroid, continue the same OSA does not tolerate CPAP.  Supplement oxygen as above Leukocytosis mild likely from steroid-normal 5 mg prednisone but at times takes 20 mg when in increased pain which she did on Saturday or Sunday.  No obvious infection in urine or CT chest abdomen.  She is afebrile  Fibromyalgia/chronic pain: on Cymbalta Neurontin  GERD on ppi  Type 2 diabetes on long-term insulin: Blood sugar controlled-patient wants to resume home 70-30 insulin ( normally on 14 units bid)- will resume at 10 units twice daily and sliding scale insulin. Monitor closely while on steroid. Recent Labs  Lab 05/23/20 0744  GLUCAP 196*   CKD stage IIIa-stable renal function. Recent Labs  Lab 05/22/20 1608 05/23/20 0450  BUN 24* 22  CREATININE 1.01* 0.94   Overweight with BMI 28:Will benefit with weight loss PCP follow-up.  Diet Order            Diet heart healthy/carb modified Room service appropriate? Yes; Fluid consistency: Thin  Diet effective now               Patient's Body mass index is 28.25 kg/m. DVT prophylaxis: enoxaparin (LOVENOX) injection 40 mg Start: 05/23/20 1000 Code Status:   Code Status: Full Code  Family Communication: plan of care discussed  with patient at bedside.  Status is: admitted as Observation Remains hospitalized for ongoing management of hypoxia chest pain.   Dispo: The patient is from: Home              Anticipated d/c is to: Home              Patient currently is not medically stable to d/c.   Difficult to place patient No   Unresulted Labs (From admission, onward)          Start      Ordered   05/29/20 0500  Creatinine, serum  (enoxaparin (LOVENOX)    CrCl >/= 30 ml/min)  Weekly,   R     Comments: while on enoxaparin therapy    05/22/20 2239        Medications reviewed:  Scheduled Meds: . atorvastatin  20 mg Oral Daily  . budesonide (PULMICORT) nebulizer solution  0.25 mg Nebulization BID  . diclofenac Sodium  2 g Topical QID  . DULoxetine  60 mg Oral QHS  . enoxaparin (LOVENOX) injection  40 mg Subcutaneous Q24H  . ezetimibe  10 mg Oral QHS  . gabapentin  100 mg Oral TID  . insulin aspart  0-9 Units Subcutaneous TID WC  . insulin glargine  8 Units Subcutaneous BID  . pantoprazole  40 mg Oral Daily  . predniSONE  5 mg Oral Q breakfast  . sodium chloride flush  3 mL Intravenous Q12H   Continuous Infusions:  Consultants:see note  Procedures:see note  Antimicrobials: Anti-infectives (From admission, onward)   None     Culture/Microbiology    Component Value Date/Time   SDES BLOOD LEFT ANTECUBITAL 01/17/2018 1630   SPECREQUEST  01/17/2018 1630    BOTTLES DRAWN AEROBIC AND ANAEROBIC Blood Culture adequate volume   CULT  01/17/2018 1630    NO GROWTH 5 DAYS Performed at [facility], [place] [address] [place], [place] [address]    REPTSTATUS 01/22/2018 FINAL 01/17/2018 1630    Other culture-see note  Objective: Vitals: Today's Vitals   05/23/20 0615 05/23/20 0630 05/23/20 0700 05/23/20 0747  BP:    122/63  Pulse: (!) 113 (!) 108  98  Resp: 18 (!) 22  (!) 23  Temp:    98.7 F (37.1 C)  TempSrc:    Oral  SpO2: 93% 91%  94%  Weight:      Height:      PainSc:   0-No pain    No intake or output data in the 24 hours ending 05/23/20 0750 Filed Weights   05/22/20 1318  Weight: 79.4 kg   Weight change:   Intake/Output from previous day: No intake/output data recorded. Intake/Output this shift: No intake/output data recorded. Filed Weights   05/22/20 1318  Weight: 79.4 kg    Examination:  General exam: AAO x3, elderly frail,NAD,  weak appearing. HEENT:Oral mucosa moist, Ear/Nose WNL grossly,dentition normal. Respiratory system: bilaterally posterior crackles present,no use of accessory muscle, non tender. Cardiovascular system: S1 & S2 +, regular, No JVD. Gastrointestinal system: Abdomen soft, NT,ND, BS+. Nervous System:Alert, awake, moving extremities and grossly nonfocal Extremities: No edema, distal peripheral pulses palpable.  Skin: No rashes,no icterus. MSK: Normal muscle bulk,tone, power.  Mild tenderness on the posterior neck.  Data Reviewed: I have personally reviewed following labs and imaging studies CBC: Recent Labs  Lab 05/22/20 1608 05/23/20 0450  WBC 15.2* 14.3*  HGB 13.6 10.9*  HCT 45.0 35.6*  MCV 86.7 85.8  PLT 421* 304  Basic Metabolic Panel: Recent Labs  Lab 05/22/20 1608 05/23/20 0450  NA 139 134*  K 4.6 4.2  CL 105 104  CO2 22 20*  GLUCOSE 202* 198*  BUN 24* 22  CREATININE 1.01* 0.94  CALCIUM 9.0 7.7*   GFR: Estimated Creatinine Clearance: 52.4 mL/min (by C-G formula based on SCr of 0.94 mg/dL). Liver Function Tests: Recent Labs  Lab 05/22/20 1800 05/23/20 0450  AST 18 10*  ALT 14 12  ALKPHOS 69 57  BILITOT 0.9 1.0  PROT 7.0 5.9*  ALBUMIN 3.2* 2.9*   No results for input(s): LIPASE, AMYLASE in the last 168 hours. No results for input(s): AMMONIA in the last 168 hours. Coagulation Profile: No results for input(s): INR, PROTIME in the last 168 hours. Cardiac Enzymes: No results for input(s): CKTOTAL, CKMB, CKMBINDEX, TROPONINI in the last 168 hours. BNP (last 3 results) No results for input(s): PROBNP in the last 8760 hours. HbA1C: No results for input(s): HGBA1C in the last 72 hours. CBG: Recent Labs  Lab 05/23/20 0744  GLUCAP 196*   Lipid Profile: No results for input(s): CHOL, HDL, LDLCALC, TRIG, CHOLHDL, LDLDIRECT in the last 72 hours. Thyroid Function Tests: No results for input(s): TSH, T4TOTAL, FREET4, T3FREE, THYROIDAB in the last 72  hours. Anemia Panel: No results for input(s): VITAMINB12, FOLATE, FERRITIN, TIBC, IRON, RETICCTPCT in the last 72 hours. Sepsis Labs: No results for input(s): PROCALCITON, LATICACIDVEN in the last 168 hours.  Recent Results (from the past 240 hour(s))  Resp Panel by RT-PCR (Flu A&B, Covid) Nasopharyngeal Swab     Status: None   Collection Time: 05/23/20 12:30 AM   Specimen: Nasopharyngeal Swab; Nasopharyngeal(NP) swabs in vial transport medium  Result Value Ref Range Status   SARS Coronavirus 2 by RT PCR NEGATIVE NEGATIVE Final    Comment: (NOTE) SARS-CoV-2 target nucleic acids are NOT DETECTED.  The SARS-CoV-2 RNA is generally detectable in upper respiratory specimens during the acute phase of infection. The lowest concentration of SARS-CoV-2 viral copies this assay can detect is 138 copies/mL. A negative result does not preclude SARS-Cov-2 infection and should not be used as the sole basis for treatment or other patient management decisions. A negative result may occur with  improper specimen collection/handling, submission of specimen other than nasopharyngeal swab, presence of viral mutation(s) within the areas targeted by this assay, and inadequate number of viral copies(<138 copies/mL). A negative result must be combined with clinical observations, patient history, and epidemiological information. The expected result is Negative.  Fact Sheet for Patients:  EntrepreneurPulse.com.au  Fact Sheet for Healthcare Providers:  IncredibleEmployment.be  This test is no t yet approved or cleared by the [place] FDA and  has been authorized for detection and/or diagnosis of SARS-CoV-2 by FDA under an Emergency Use Authorization (EUA). This EUA will remain  in effect (meaning this test can be used) for the duration of the COVID-19 declaration under Section 564(b)(1) of the Act, 21 U.S.C.section 360bbb-3(b)(1), unless the authorization is  terminated  or revoked sooner.       Influenza A by PCR NEGATIVE NEGATIVE Final   Influenza B by PCR NEGATIVE NEGATIVE Final    Comment: (NOTE) The Xpert Xpress SARS-CoV-2/FLU/RSV plus assay is intended as an aid in the diagnosis of influenza from Nasopharyngeal swab specimens and should not be used as a sole basis for treatment. Nasal washings and aspirates are unacceptable for Xpert Xpress SARS-CoV-2/FLU/RSV testing.  Fact Sheet for Patients: EntrepreneurPulse.com.au  Fact Sheet for Healthcare Providers: IncredibleEmployment.be  This  test is not yet approved or cleared by the [place] and has been authorized for detection and/or diagnosis of SARS-CoV-2 by FDA under an Emergency Use Authorization (EUA). This EUA will remain in effect (meaning this test can be used) for the duration of the COVID-19 declaration under Section 564(b)(1) of the Act, 21 U.S.C. section 360bbb-3(b)(1), unless the authorization is terminated or revoked.  Performed at [facility], [place] [address], [place], [place] [address]      Radiology Studies: DG Chest 2 View  Result Date: 05/22/2020 CLINICAL DATA:  Chest pain. EXAM: CHEST - 2 VIEW COMPARISON:  January 17, 2018. FINDINGS: The heart size and mediastinal contours are within normal limits. Both lungs are clear. No pneumothorax or pleural effusion is noted. The visualized skeletal structures are unremarkable. IMPRESSION: No active cardiopulmonary disease. Electronically Signed   By: Marijo Conception M.D.   On: 05/22/2020 14:18   CT Angio Chest PE W and/or Wo Contrast  Result Date: 05/22/2020 CLINICAL DATA:  Status post fall 2 weeks ago with right-sided chest pain. EXAM: CT ANGIOGRAPHY CHEST WITH CONTRAST TECHNIQUE: Multidetector CT imaging of the chest was performed using the standard protocol during bolus administration of intravenous contrast. Multiplanar CT image reconstructions and MIPs  were obtained to evaluate the vascular anatomy. CONTRAST:  137mL OMNIPAQUE IOHEXOL 350 MG/ML SOLN COMPARISON:  January 09, 2017 FINDINGS: Cardiovascular: There is marked severity calcification of the aortic arch without evidence of aneurysmal dilatation or dissection. The subsegmental pulmonary arteries are limited in evaluation secondary to patient motion and areas of overlying artifact. No evidence of pulmonary embolism. Normal heart size with moderate severity coronary artery calcification. No pericardial effusion. Mediastinum/Nodes: No enlarged mediastinal, hilar, or axillary lymph nodes. Thyroid gland, trachea, and esophagus demonstrate no significant findings. Lungs/Pleura: Mild emphysematous lung disease is noted within the bilateral apices. Mild areas of scarring and/or atelectasis are seen along the posterior aspect of the right lung and left lung base. This is most prominent within the right lower lobe. There is no evidence of a pleural effusion or pneumothorax. Upper Abdomen: A stable 5.1 cm x 4.0 cm well-defined area of parenchymal low attenuation is seen within the spleen. Musculoskeletal: Chronic posterior tenth and eleventh left rib fractures are seen. Multilevel degenerative changes seen throughout the thoracic spine. Other: The study is limited secondary to patient motion. Review of the MIP images confirms the above findings. IMPRESSION: 1. No evidence of pulmonary embolism. 2. Mild bilateral areas of scarring and/or atelectasis, as described above. 3. Chronic posterior tenth and eleventh left rib fractures. 4. Large, stable splenic cyst or hemangioma. 5. Emphysema and aortic atherosclerosis. Aortic Atherosclerosis (ICD10-I70.0) and Emphysema (ICD10-J43.9). Electronically Signed   By: Virgina Norfolk M.D.   On: 05/22/2020 19:16   CT ABDOMEN PELVIS W CONTRAST  Result Date: 05/22/2020 CLINICAL DATA:  Right lower quadrant abdominal pain, fell 2 weeks ago, right chest wall bruising EXAM: CT  ABDOMEN AND PELVIS WITH CONTRAST TECHNIQUE: Multidetector CT imaging of the abdomen and pelvis was performed using the standard protocol following bolus administration of intravenous contrast. CONTRAST:  17mL OMNIPAQUE IOHEXOL 350 MG/ML SOLN COMPARISON:  01/18/2018 FINDINGS: Lower chest: Scattered areas of scarring and atelectasis at the lung bases. No acute pleural or parenchymal lung disease. Hepatobiliary: No focal liver abnormality is seen. No gallstones, gallbladder wall thickening, or biliary dilatation. Pancreas: Unremarkable. No pancreatic ductal dilatation or surrounding inflammatory changes. Spleen: Stable 5.2 cm splenic cyst.  Spleen is normal in size. Adrenals/Urinary Tract: Subcentimeter  renal cortical hypodensities are stable, most compatible with cysts. No urinary tract calculi or obstructive uropathy. The bladder is decompressed, with no gross abnormalities. The adrenals are normal. Stomach/Bowel: No bowel obstruction or ileus. Scattered diverticulosis of the descending and sigmoid colon without diverticulitis. Normal appendix right lower quadrant. No bowel wall thickening or inflammatory change. Vascular/Lymphatic: Aortic atherosclerosis. No enlarged abdominal or pelvic lymph nodes. Reproductive: Status post hysterectomy. No adnexal masses. Other: No free fluid or free gas.  No abdominal wall hernia. Musculoskeletal: No acute or destructive bony lesions. Stable lower lumbar spondylosis. Reconstructed images demonstrate no additional findings. IMPRESSION: 1. No acute intra-abdominal or intrapelvic process. 2. Distal colonic diverticulosis without diverticulitis. 3. Normal appendix. 4. Stable splenic and renal cysts. 5.  Aortic Atherosclerosis (ICD10-I70.0). Electronically Signed   By: Randa Ngo M.D.   On: 05/22/2020 19:10     LOS: 0 days   [name], MD [facility]  05/23/2020, 7:50 AM

## 2020-05-23 NOTE — Progress Notes (Signed)
Patient in yellow mews upon arrival to unit

## 2020-05-24 DIAGNOSIS — J9601 Acute respiratory failure with hypoxia: Secondary | ICD-10-CM | POA: Diagnosis not present

## 2020-05-24 LAB — BASIC METABOLIC PANEL
Anion gap: 9 (ref 5–15)
BUN: 18 mg/dL (ref 8–23)
CO2: 21 mmol/L — ABNORMAL LOW (ref 22–32)
Calcium: 8 mg/dL — ABNORMAL LOW (ref 8.9–10.3)
Chloride: 108 mmol/L (ref 98–111)
Creatinine, Ser: 0.92 mg/dL (ref 0.44–1.00)
GFR, Estimated: >60 mL/min (ref >60)
Glucose, Bld: 137 mg/dL — ABNORMAL HIGH (ref 70–99)
Potassium: 4 mmol/L (ref 3.5–5.1)
Sodium: 138 mmol/L (ref 135–145)

## 2020-05-24 LAB — CBC
HCT: 34.9 % — ABNORMAL LOW (ref 36.0–46.0)
Hemoglobin: 10.4 g/dL — ABNORMAL LOW (ref 12.0–15.0)
MCH: 25.8 pg — ABNORMAL LOW (ref 26.0–34.0)
MCHC: 29.8 g/dL — ABNORMAL LOW (ref 30.0–36.0)
MCV: 86.6 fL (ref 80.0–100.0)
Platelets: 262 10*3/uL (ref 150–400)
RBC: 4.03 MIL/uL (ref 3.87–5.11)
RDW: 15.9 % — ABNORMAL HIGH (ref 11.5–15.5)
WBC: 9.6 10*3/uL (ref 4.0–10.5)
nRBC: 0 % (ref 0.0–0.2)

## 2020-05-24 LAB — GLUCOSE, CAPILLARY
Glucose-Capillary: 132 mg/dL — ABNORMAL HIGH (ref 70–99)
Glucose-Capillary: 198 mg/dL — ABNORMAL HIGH (ref 70–99)
Glucose-Capillary: 261 mg/dL — ABNORMAL HIGH (ref 70–99)
Glucose-Capillary: 260 mg/dL — ABNORMAL HIGH (ref 70–99)

## 2020-05-24 NOTE — NC FL2 (Signed)
[place] LEVEL OF CARE SCREENING TOOL     IDENTIFICATION  Patient Name: [name] Birthdate: Dec 15, 1940 Sex: female Admission Date (Current Location): 05/22/2020  [facility] and [place] Number:  [place] and Address:  [facility],  [place] [address], [place]      Provider Number: [id number]  Attending Physician Name and Address:  [name], MD  Relative Name and Phone Number:  Buck Mam [phone]  [phone]  Emiliano Dyer Daughter   [phone]    Current Level of Care: Hospital Recommended Level of Care: [place] Prior Approval Number:    Date Approved/Denied:   PASRR Number: [id number]  Discharge Plan: SNF    Current Diagnoses: Patient Active Problem List   Diagnosis Date Noted  . Hypoxia 05/23/2020  . Acute respiratory failure with hypoxia ([place]) 05/22/2020  . Rotator cuff arthropathy of left shoulder 12/13/2019  . Pain in joint of right shoulder 12/10/2019  . Pain in joint of left shoulder 12/10/2019  . Hearing loss, sensorineural, 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  . Tinnitus, left 01/08/2019  . DM type 2, not at goal [facility]) 06/03/2018  . Frequent infections 05/21/2018  . Gangrene ([place]) 05/21/2018  . Pain in finger 05/07/2018  . Family history of aneurysm 05/07/2018  . Diabetes mellitus due to underlying condition, controlled, with complication, without long-term current use of insulin ([place])   . Lower urinary tract infectious disease   . Palpitations 01/17/2018  . Dehydration 01/17/2018  . Hypotension 01/17/2018  . Symptom of blood in stool 01/17/2018  . Diarrhea in adult patient 01/17/2018  . 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  . Aftercare following joint replacement 08/21/2017  . 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 Closed displaced transverse fracture of right patella  05/06/2017  . Anemia 04/16/2017  . Chronic interstitial lung disease ([place]) 04/16/2017  . Chronic kidney disease (CKD), stage III (moderate) ([place]) 04/16/2017  . Other hyperlipidemia 04/16/2017  . Type 2 diabetes mellitus without complication, without long-term current use of insulin ([place]) 04/16/2017  . Pain in right knee 03/25/2017  . Aftercare 03/25/2017  . Arthritis, septic, knee ([place]) 03/09/2017  . 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[place]) 06/04/2016  . Fibromyalgia 06/04/2016  . High risk medication use 06/04/2016  . Claw toe, acquired, left 01/29/2016  . Rheumatoid arthritis involving both feet with negative rheumatoid factor ([place]) 01/29/2016  . Septic prepatellar bursitis of right knee 10/26/2014  . Prepatellar bursitis of right knee 08/28/2014  . DYSPHAGIA ORAL PHASE 06/18/2007  . Asthma 05/14/2007  . SLEEP APNEA 05/14/2007  . Sleep apnea 05/14/2007  . GERD 05/13/2007  . Rheumatoid arthritis ([place]) 05/13/2007  . OSTEOPOROSIS 05/13/2007  . DIARRHEA, CHRONIC 05/13/2007    Orientation RESPIRATION BLADDER Height & Weight     Self,Time,Situation,Place  O2 (3L) Continent Weight: 182 lb 8.7 oz (82.8 kg) Height:  5\' 6"  (167.6 cm)  BEHAVIORAL SYMPTOMS/MOOD NEUROLOGICAL BOWEL NUTRITION STATUS      Continent Diet  AMBULATORY STATUS COMMUNICATION OF NEEDS Skin   Limited Assist Verbally Normal                       Personal Care Assistance Level of Assistance  Bathing,Feeding,Dressing Bathing Assistance: Limited assistance Feeding assistance: Independent Dressing Assistance: Limited assistance     Functional Limitations Info  Sight,Hearing,Speech Sight Info: Adequate Hearing Info: Adequate Speech Info: Adequate    SPECIAL CARE FACTORS FREQUENCY  PT (By licensed PT),OT (By licensed OT)     PT Frequency: Minimum 5x a week OT Frequency: Minimum 5x a week            Contractures Contractures Info: Present    Additional Factors Info  Code Status,Allergies,Insulin  Sliding Scale Code Status Info: Full Code Allergies Info: Penicillins   Sulfamethoxazole-trimethoprim   Meperidine   Sulfa Antibiotics   Sulfonamide Derivatives   Insulin Sliding Scale Info: insulin aspart (novoLOG) injection 0-9 Units 3x a day with meals       Current Medications (05/24/2020):  This is the current hospital active medication list Current Facility-Administered Medications  Medication Dose Route Frequency Provider Last Rate Last Admin  . acetaminophen (TYLENOL) tablet 650 mg  650 mg Oral Q6H PRN Marcelyn Bruins, MD   650 mg at 05/23/20 2133   Or  . acetaminophen (TYLENOL) suppository 650 mg  650 mg Rectal Q6H PRN Marcelyn Bruins, MD      . arformoterol [facility]) nebulizer solution 15 mcg  15 mcg Nebulization BID Hunsucker, Bonna Gains, MD   15 mcg at 05/24/20 0836  . atorvastatin (LIPITOR) tablet 20 mg  20 mg Oral Daily Marcelyn Bruins, MD   20 mg at 05/24/20 0817  . budesonide (PULMICORT) nebulizer solution 0.25 mg  0.25 mg Nebulization BID Marcelyn Bruins, MD   0.25 mg at 05/24/20 0836  . diclofenac Sodium (VOLTAREN) 1 % topical gel 2 g  2 g Topical QID Marcelyn Bruins, MD   2 g at 05/24/20 1653  . DULoxetine (CYMBALTA) DR capsule 60 mg  60 mg Oral QHS Marcelyn Bruins, MD   60 mg at 05/23/20 2133  . enoxaparin (LOVENOX) injection 40 mg  40 mg Subcutaneous Q24H Marcelyn Bruins, MD   40 mg at 05/24/20 0817  . ezetimibe (ZETIA) tablet 10 mg  10 mg Oral QHS Marcelyn Bruins, MD   10 mg at 05/23/20 2133  . ferrous sulfate tablet 325 mg  325 mg Oral QHS [name], MD   325 mg at 05/23/20 2133  . gabapentin (NEURONTIN) capsule 100 mg  100 mg Oral TID Marcelyn Bruins, MD   100 mg at 05/24/20 1653  . HYDROcodone-acetaminophen (NORCO/VICODIN) 5-325 MG per tablet 1 tablet  1 tablet Oral Q6H PRN Marcelyn Bruins, MD   1 tablet at 05/23/20 1816  . insulin aspart (novoLOG) injection 0-9 Units  0-9 Units Subcutaneous TID WC Marcelyn Bruins, MD   5  Units at 05/24/20 1703  . insulin aspart protamine- aspart (NOVOLOG MIX 70/30) injection 10 Units  10 Units Subcutaneous BID WC Kc, Ramesh, MD   10 Units at 05/24/20 1704  . ipratropium-albuterol (DUONEB) 0.5-2.5 (3) MG/3ML nebulizer solution 3 mL  3 mL Nebulization Q6H PRN Hunsucker, Bonna Gains, MD      . mirabegron ER (MYRBETRIQ) tablet 50 mg  50 mg Oral Daily Kc, Ramesh, MD   50 mg at 05/24/20 0817  . pantoprazole (PROTONIX) EC tablet 40 mg  40 mg Oral Daily Marcelyn Bruins, MD   40 mg at 05/24/20 0817  . polyethylene glycol (MIRALAX / GLYCOLAX) packet 17 g  17 g Oral Daily PRN Marcelyn Bruins, MD      . predniSONE (DELTASONE) tablet 5 mg  5 mg Oral Q breakfast Marcelyn Bruins, MD   5 mg at 05/24/20 0102  . sodium chloride flush (NS) 0.9 % injection 3 mL  3 mL Intravenous Q12H Neva Seat  B, MD   3 mL at 05/24/20 0808  . vitamin B-12 (CYANOCOBALAMIN) tablet 2,500 mcg  2,500 mcg Oral Cloretta Ned, MD   2,500 mcg at 05/23/20 2133     Discharge Medications: Please see discharge summary for a list of discharge medications.  Relevant Imaging Results:  Relevant Lab Results:   Additional Information SSN [id number]  Ross Ludwig, LCSW

## 2020-05-24 NOTE — Progress Notes (Signed)
PROGRESS NOTE    [name]  ZOX:[id number] DOB: Mar 21, 1940 DOA: 05/22/2020 PCP: [name], MD   Chief Complaint  Patient presents with  . Fall  . rib cage pain  . Chest Pain  Brief Narrative: 80 year old female with acquired hypogammaglobinemia, anemia, asthma/chronic lung disease, CKD stage 3, diabetes, dysphagia, fibromyalgia,chronic pain, GERD, hearing loss, RA, OSA admitted with chest pain abdominal pain, with history of fall 3 to 4 weeks ago on the right side, no pain initially but woke up 3/15 with severe right-sided pain on her chest and right abdominal wall.  Has had some shortness of breath, chronic diarrhea but no fever nausea vomiting. In the ED,noted to be hypoxic 88% needing 2 L oxygen to maintain saturation in low to mid 90s, tachycardic in 100 intermittent tachypneic blood pressure stable blood work fairly stable except for leukocytosis, showed CMP with BUN 24,glucose 202, albumin 3.2.CBC with leukocytosis to 15, platelets 421.Troponin negative x2.  BNP normal.Respiratory panel flu COVID-negative,UA glucose 50 ketones 5,chest x-ray no acute finding underwent CTA chest and at abd/pelvis w/ contrast-no PE, mild bilateral scaring and or atelectasis, chronic posterior 10th and 11th left rib fractures, emphysema.large stable splenic cyst hemangioma. PCCM consulted  Subjective: Seen and examined this morning patient reports her pain is significantly improved, mild neck discomfort  Able to breathe better  Still on 3 L nasal cannula oxygen. Reluctant to go to skilled nursing facility  Assessment & Plan:  Right lower chest wall pain-appears musculoskeletal Acute respiratory failure with hypoxia consistent with hypoventilation in the setting of pain. Asthma/emphysema-PFTs 2018 with fixed obstruction DLCO moderately reduced  OSA Chest wall pain is significantly better.  Still needing 3 to nasal cannula. CTA negative for PE, troponin negative x2 BNP normal.  Recent history of  fall but no fractures on the x-rays.  History of chronic neck pain.  Has crackles on the posterior lungs consistent with hypoventilation.  Encourage incentive spirometry and demonstrated.  Encourage ambulation PT OT.  PT OT suggested skilled nursing facility but she is reluctant.  Appreciate pulmonary consult on board.  Continue pain control significantly improved.  On Tylenol/Norco, IV Dilaudid-received narcotics yesterday morning only.  Continue her Pulmicort, she is on a steroid for RA and will continue it.  Cervical DJD/Neck pain- chronic.  Pain is controlled. RA on chronic steroid continue the same. OSA does not tolerate CPAP.  Outpatient follow-up. Leukocytosis mild likely from steroid-at times takes 20 mg when in increased pain which she did on Saturday or Sunday.  No obvious infection in urine or CT chest abdomen.  It has resolved. Recent Labs  Lab 05/22/20 1608 05/23/20 0450 05/24/20 0333  WBC 15.2* 14.3* 9.6   Fibromyalgia/chronic pain: on Cymbalta Neurontin.  Continue pain management as above  GERD continue PPI  Type 2 diabetes on long-term insulin: Blood sugar fairly stable continue home 70-30 insulin ( normally on 14 units bid)- cont 10 units twice daily and sliding scale insulin. Monitor closely while on steroid. Recent Labs  Lab 05/23/20 0744 05/23/20 1137 05/23/20 1611 05/23/20 2016 05/24/20 0735  GLUCAP 196* 186* 163* 245* 132*   CKD stage IIIa-renal function stable Recent Labs  Lab 05/22/20 1608 05/23/20 0450 05/24/20 0333  BUN 24* 22 18  CREATININE 1.01* 0.94 0.92   Overweight with BMI 28:Will benefit with weight loss PCP follow-up.  Diet Order            Diet regular Room service appropriate? Yes; Fluid consistency: Thin  Diet effective now  Patient's Body mass index is 29.46 kg/m. DVT prophylaxis: enoxaparin (LOVENOX) injection 40 mg Start: 05/23/20 1000 Code Status:   Code Status: Full Code  Family Communication: plan of care  discussed with patient at bedside.  Status is: admitted as Observation Remains hospitalized for ongoing management of hypoxia chest pain.   Dispo: The patient is from: Home              Anticipated d/c is to: Home next 1 to 2 days if able to come off the oxygen and if cleared by PT.  PT recommending SNF but she is declining.              Patient currently is not medically stable to d/c.   Difficult to place patient No   Unresulted Labs (From admission, onward)          Start     Ordered   05/29/20 0500  Creatinine, serum  (enoxaparin (LOVENOX)    CrCl >/= 30 ml/min)  Weekly,   R     Comments: while on enoxaparin therapy    05/22/20 2239   05/24/20 5427  Basic metabolic panel  Daily,   R     Question:  Specimen collection method  Answer:  Lab=Lab collect   05/23/20 0803   05/24/20 0500  CBC  Daily,   R     Question:  Specimen collection method  Answer:  Lab=Lab collect   05/23/20 0803        Medications reviewed:  Scheduled Meds: . arformoterol  15 mcg Nebulization BID  . atorvastatin  20 mg Oral Daily  . budesonide (PULMICORT) nebulizer solution  0.25 mg Nebulization BID  . diclofenac Sodium  2 g Topical QID  . DULoxetine  60 mg Oral QHS  . enoxaparin (LOVENOX) injection  40 mg Subcutaneous Q24H  . ezetimibe  10 mg Oral QHS  . ferrous sulfate  325 mg Oral QHS  . gabapentin  100 mg Oral TID  . insulin aspart  0-9 Units Subcutaneous TID WC  . insulin aspart protamine- aspart  10 Units Subcutaneous BID WC  . mirabegron ER  50 mg Oral Daily  . pantoprazole  40 mg Oral Daily  . predniSONE  5 mg Oral Q breakfast  . sodium chloride flush  3 mL Intravenous Q12H  . vitamin B-12  2,500 mcg Oral QHS   Continuous Infusions:  Consultants:see note  Procedures:see note  Antimicrobials: Anti-infectives (From admission, onward)   None     Culture/Microbiology    Component Value Date/Time   SDES BLOOD LEFT ANTECUBITAL 01/17/2018 1630   SPECREQUEST  01/17/2018 1630    BOTTLES  DRAWN AEROBIC AND ANAEROBIC Blood Culture adequate volume   CULT  01/17/2018 1630    NO GROWTH 5 DAYS Performed at [facility], [place] [address] [place], [place] [address]    REPTSTATUS 01/22/2018 FINAL 01/17/2018 1630    Other culture-see note  Objective: Vitals: Today's Vitals   05/24/20 0433 05/24/20 0500 05/24/20 0837 05/24/20 0839  BP: 115/74     Pulse: 87     Resp: 19     Temp: 98.4 F (36.9 C)     TempSrc: Oral     SpO2: 90%  91% 91%  Weight:  82.8 kg    Height:      PainSc:  Asleep      Intake/Output Summary (Last 24 hours) at 05/24/2020 0945 Last data filed at 05/24/2020 0600 Gross per 24 hour  Intake 480  ml  Output 300 ml  Net 180 ml   Filed Weights   05/22/20 1318 05/24/20 0500  Weight: 79.4 kg 82.8 kg   Weight change: 3.42 kg  Intake/Output from previous day: 03/15 0701 - 03/16 0700 In: 480 [P.O.:480] Out: 300 [Urine:300] Intake/Output this shift: No intake/output data recorded. Filed Weights   05/22/20 1318 05/24/20 0500  Weight: 79.4 kg 82.8 kg    Examination: General exam: AAOx3,NAD, weak appearing. HEENT:Oral mucosa moist, Ear/Nose WNL grossly, dentition normal. Respiratory system: bilaterally basal crackles on posterior lungs,no wheezing,no use of accessory muscle Cardiovascular system: S1 & S2 +, No JVD,. Gastrointestinal system: Abdomen soft, NT,ND, BS+ Nervous System:Alert, awake, moving extremities and grossly nonfocal Extremities: No edema, distal peripheral pulses palpable.  Skin: No rashes,no icterus. MSK: Normal muscle bulk,tone, power  Data Reviewed: I have personally reviewed following labs and imaging studies CBC: Recent Labs  Lab 05/22/20 1608 05/23/20 0450 05/24/20 0333  WBC 15.2* 14.3* 9.6  HGB 13.6 10.9* 10.4*  HCT 45.0 35.6* 34.9*  MCV 86.7 85.8 86.6  PLT 421* 304 944   Basic Metabolic Panel: Recent Labs  Lab 05/22/20 1608 05/23/20 0450 05/24/20 0333  NA 139 134* 138  K 4.6 4.2 4.0  CL 105 104 108   CO2 22 20* 21*  GLUCOSE 202* 198* 137*  BUN 24* 22 18  CREATININE 1.01* 0.94 0.92  CALCIUM 9.0 7.7* 8.0*   GFR: Estimated Creatinine Clearance: 54.7 mL/min (by C-G formula based on SCr of 0.92 mg/dL). Liver Function Tests: Recent Labs  Lab 05/22/20 1800 05/23/20 0450  AST 18 10*  ALT 14 12  ALKPHOS 69 57  BILITOT 0.9 1.0  PROT 7.0 5.9*  ALBUMIN 3.2* 2.9*   No results for input(s): LIPASE, AMYLASE in the last 168 hours. No results for input(s): AMMONIA in the last 168 hours. Coagulation Profile: No results for input(s): INR, PROTIME in the last 168 hours. Cardiac Enzymes: No results for input(s): CKTOTAL, CKMB, CKMBINDEX, TROPONINI in the last 168 hours. BNP (last 3 results) No results for input(s): PROBNP in the last 8760 hours. HbA1C: No results for input(s): HGBA1C in the last 72 hours. CBG: Recent Labs  Lab 05/23/20 0744 05/23/20 1137 05/23/20 1611 05/23/20 2016 05/24/20 0735  GLUCAP 196* 186* 163* 245* 132*   Lipid Profile: No results for input(s): CHOL, HDL, LDLCALC, TRIG, CHOLHDL, LDLDIRECT in the last 72 hours. Thyroid Function Tests: No results for input(s): TSH, T4TOTAL, FREET4, T3FREE, THYROIDAB in the last 72 hours. Anemia Panel: No results for input(s): VITAMINB12, FOLATE, FERRITIN, TIBC, IRON, RETICCTPCT in the last 72 hours. Sepsis Labs: No results for input(s): PROCALCITON, LATICACIDVEN in the last 168 hours.  Recent Results (from the past 240 hour(s))  Resp Panel by RT-PCR (Flu A&B, Covid) Nasopharyngeal Swab     Status: None   Collection Time: 05/23/20 12:30 AM   Specimen: Nasopharyngeal Swab; Nasopharyngeal(NP) swabs in vial transport medium  Result Value Ref Range Status   SARS Coronavirus 2 by RT PCR NEGATIVE NEGATIVE Final    Comment: (NOTE) SARS-CoV-2 target nucleic acids are NOT DETECTED.  The SARS-CoV-2 RNA is generally detectable in upper respiratory specimens during the acute phase of infection. The lowest concentration of  SARS-CoV-2 viral copies this assay can detect is 138 copies/mL. A negative result does not preclude SARS-Cov-2 infection and should not be used as the sole basis for treatment or other patient management decisions. A negative result may occur with  improper specimen collection/handling, submission of specimen other than nasopharyngeal  swab, presence of viral mutation(s) within the areas targeted by this assay, and inadequate number of viral copies(<138 copies/mL). A negative result must be combined with clinical observations, patient history, and epidemiological information. The expected result is Negative.  Fact Sheet for Patients:  EntrepreneurPulse.com.au  Fact Sheet for Healthcare Providers:  IncredibleEmployment.be  This test is no t yet approved or cleared by the [place] FDA and  has been authorized for detection and/or diagnosis of SARS-CoV-2 by FDA under an Emergency Use Authorization (EUA). This EUA will remain  in effect (meaning this test can be used) for the duration of the COVID-19 declaration under Section 564(b)(1) of the Act, 21 U.S.C.section 360bbb-3(b)(1), unless the authorization is terminated  or revoked sooner.       Influenza A by PCR NEGATIVE NEGATIVE Final   Influenza B by PCR NEGATIVE NEGATIVE Final    Comment: (NOTE) The Xpert Xpress SARS-CoV-2/FLU/RSV plus assay is intended as an aid in the diagnosis of influenza from Nasopharyngeal swab specimens and should not be used as a sole basis for treatment. Nasal washings and aspirates are unacceptable for Xpert Xpress SARS-CoV-2/FLU/RSV testing.  Fact Sheet for Patients: EntrepreneurPulse.com.au  Fact Sheet for Healthcare Providers: IncredibleEmployment.be  This test is not yet approved or cleared by the [place] FDA and has been authorized for detection and/or diagnosis of SARS-CoV-2 by FDA under an Emergency Use  Authorization (EUA). This EUA will remain in effect (meaning this test can be used) for the duration of the COVID-19 declaration under Section 564(b)(1) of the Act, 21 U.S.C. section 360bbb-3(b)(1), unless the authorization is terminated or revoked.  Performed at [facility], [place] [address], [place], [place] [address]      Radiology Studies: DG Chest 2 View  Result Date: 05/22/2020 CLINICAL DATA:  Chest pain. EXAM: CHEST - 2 VIEW COMPARISON:  January 17, 2018. FINDINGS: The heart size and mediastinal contours are within normal limits. Both lungs are clear. No pneumothorax or pleural effusion is noted. The visualized skeletal structures are unremarkable. IMPRESSION: No active cardiopulmonary disease. Electronically Signed   By: Marijo Conception M.D.   On: 05/22/2020 14:18   CT Angio Chest PE W and/or Wo Contrast  Result Date: 05/22/2020 CLINICAL DATA:  Status post fall 2 weeks ago with right-sided chest pain. EXAM: CT ANGIOGRAPHY CHEST WITH CONTRAST TECHNIQUE: Multidetector CT imaging of the chest was performed using the standard protocol during bolus administration of intravenous contrast. Multiplanar CT image reconstructions and MIPs were obtained to evaluate the vascular anatomy. CONTRAST:  11mL OMNIPAQUE IOHEXOL 350 MG/ML SOLN COMPARISON:  January 09, 2017 FINDINGS: Cardiovascular: There is marked severity calcification of the aortic arch without evidence of aneurysmal dilatation or dissection. The subsegmental pulmonary arteries are limited in evaluation secondary to patient motion and areas of overlying artifact. No evidence of pulmonary embolism. Normal heart size with moderate severity coronary artery calcification. No pericardial effusion. Mediastinum/Nodes: No enlarged mediastinal, hilar, or axillary lymph nodes. Thyroid gland, trachea, and esophagus demonstrate no significant findings. Lungs/Pleura: Mild emphysematous lung disease is noted within the bilateral apices.  Mild areas of scarring and/or atelectasis are seen along the posterior aspect of the right lung and left lung base. This is most prominent within the right lower lobe. There is no evidence of a pleural effusion or pneumothorax. Upper Abdomen: A stable 5.1 cm x 4.0 cm well-defined area of parenchymal low attenuation is seen within the spleen. Musculoskeletal: Chronic posterior tenth and eleventh left rib fractures are seen. Multilevel degenerative  changes seen throughout the thoracic spine. Other: The study is limited secondary to patient motion. Review of the MIP images confirms the above findings. IMPRESSION: 1. No evidence of pulmonary embolism. 2. Mild bilateral areas of scarring and/or atelectasis, as described above. 3. Chronic posterior tenth and eleventh left rib fractures. 4. Large, stable splenic cyst or hemangioma. 5. Emphysema and aortic atherosclerosis. Aortic Atherosclerosis (ICD10-I70.0) and Emphysema (ICD10-J43.9). Electronically Signed   By: Virgina Norfolk M.D.   On: 05/22/2020 19:16   CT ABDOMEN PELVIS W CONTRAST  Result Date: 05/22/2020 CLINICAL DATA:  Right lower quadrant abdominal pain, fell 2 weeks ago, right chest wall bruising EXAM: CT ABDOMEN AND PELVIS WITH CONTRAST TECHNIQUE: Multidetector CT imaging of the abdomen and pelvis was performed using the standard protocol following bolus administration of intravenous contrast. CONTRAST:  1105mL OMNIPAQUE IOHEXOL 350 MG/ML SOLN COMPARISON:  01/18/2018 FINDINGS: Lower chest: Scattered areas of scarring and atelectasis at the lung bases. No acute pleural or parenchymal lung disease. Hepatobiliary: No focal liver abnormality is seen. No gallstones, gallbladder wall thickening, or biliary dilatation. Pancreas: Unremarkable. No pancreatic ductal dilatation or surrounding inflammatory changes. Spleen: Stable 5.2 cm splenic cyst.  Spleen is normal in size. Adrenals/Urinary Tract: Subcentimeter renal cortical hypodensities are stable, most  compatible with cysts. No urinary tract calculi or obstructive uropathy. The bladder is decompressed, with no gross abnormalities. The adrenals are normal. Stomach/Bowel: No bowel obstruction or ileus. Scattered diverticulosis of the descending and sigmoid colon without diverticulitis. Normal appendix right lower quadrant. No bowel wall thickening or inflammatory change. Vascular/Lymphatic: Aortic atherosclerosis. No enlarged abdominal or pelvic lymph nodes. Reproductive: Status post hysterectomy. No adnexal masses. Other: No free fluid or free gas.  No abdominal wall hernia. Musculoskeletal: No acute or destructive bony lesions. Stable lower lumbar spondylosis. Reconstructed images demonstrate no additional findings. IMPRESSION: 1. No acute intra-abdominal or intrapelvic process. 2. Distal colonic diverticulosis without diverticulitis. 3. Normal appendix. 4. Stable splenic and renal cysts. 5.  Aortic Atherosclerosis (ICD10-I70.0). Electronically Signed   By: Randa Ngo M.D.   On: 05/22/2020 19:10     LOS: 1 day   [name], MD [facility]  05/24/2020, 9:45 AM

## 2020-05-24 NOTE — Consult Note (Signed)
NAME:  [name], MRN:  [id number], DOB:  [birth year], LOS: 1 ADMISSION DATE:  05/22/2020, CONSULTATION DATE:  05/24/20 REFERRING MD:  TRH CHIEF COMPLAINT:  pain   Brief History:  80 year old with RA on chronic prednisone formally followed in [facility] by Dr. Vaughan Browner without evidence of significant ILD in the past who presents with right upper quadrant/right chest wall pain and shortness of breath with new hypoxemia.  History of Present Illness:  Most recent pulmonary notes x2 reviewed, most recent 2018.  Patient with long history family history of RA.  Chronic joint changes in fingers and wrist.  Previous seen by pulmonary.  PFTs in 2018 on my interpretation revealed normal spirometry, no bronchodilator response, normal lung volumes, moderate reduced DLCO.  She has had multiple CT chest in the past most recent [place] reviewed (2019 CTA images not available despite being in our EMR) from 2018 appears to be high as supine and prone which demonstrates emphysematous changes, bronchiectasis in bilateral bases, no evidence of interstitial lung disease.  She had a CT PE protocol yesterday which on my interpretation shows low lung volumes (expiratory film) bilateral lower lobe dependent linear infiltrates consistent with atelectasis, no evidence of bronchiectasis, dilated esophagus proximally, emphysematous changes in the apices.  No evidence of PE.  Reviewed PFTs most recent 2018 which are normal with the exception of moderately reduced DLCO.  She describes onset of shortness of breath associated with her right chest pain/right upper quadrant pain.  Prior to that, she had no increase or worsening shortness of breath or dyspnea on exertion.  No cough.  Joint disease seems at baseline.  She endorses some nausea, no frank emesis.  No diarrhea.  She can identify no alleviating or exacerbating factors for the pain prior to presentation.  Not worse with eating or drinking.  No timing during the day were  better or worse.  No environmental or seasonal changes.  She is been getting pain medications which is helped chest pain.  Past Medical History:  RA on prednisone 5 mg daily, asthma, diabetes  Significant Hospital Events:  3/14 admitted 3/15 PCCM consult  Consults:  PCCM  Procedures:  N/a  Significant Diagnostic Tests:  CTA PE protocol 3/14, interpretation reveals bilateral lower lobe dependent atelectatic changes, no PE, no evidence of ILD or other infiltrate  Micro Data:  N/A  Antimicrobials:  N/A  Interim History / Subjective:  NAEON. Pain better. Breathing about the same. Remains on 3L [place]. lying nearly flat in bed at time of eval.  Objective   Blood pressure 112/79, pulse 97, temperature 98.5 F (36.9 C), temperature source Oral, resp. rate 18, height 5\' 6"  (1.676 m), weight 82.8 kg, SpO2 93 %.        Intake/Output Summary (Last 24 hours) at 05/24/2020 1741 Last data filed at 05/24/2020 0600 Gross per 24 hour  Intake 480 ml  Output --  Net 480 ml   Filed Weights   05/22/20 1318 05/24/20 0500  Weight: 79.4 kg 82.8 kg    Examination: General: Chronically ill-appearing, in no acute distress Eyes: EOMI no icterus Neck: Supple, no JVP appreciated Lungs: Clear to auscultation bilaterally, no wheeze or crackle Cardiovascular: tachy, no murmurs Abdomen: ND, BS present, palpation of right upper quadrant with mild pain MSK: chronic changes from RA in bilateral hands, tenderness to light palpation on the right mid to upper chest wall mid clavicular line Neuro: No weakness, sensation intact Psych: normal mood, full affect  Resolved Hospital Problem  list   n/a  Assessment & Plan:  Acute Hypoxemic Respiratory Failure: Clinical history and images consistent with hypoventilation in the setting of RUQ vs R chest wall pain with resultant atelectasis in bilateral bases causing shunt physiology for which she can not adequally VQ match given underlying emphysema. Images not  consistent with RA-ILD (usually UIP pattern). Symptoms did not occur prior to pain limiting ventilation. --Incentive spirometer --OOB to chair - she must get out of bed or will not improve --Ambulate as able --Pain control per primary --May require O2 on discharge  Emphysema/Asthma: PFTs 2018 without fixed obstruction. DLCO moderately reduced. --Budesonide neb BID, add formoterol neb BID, Duonebs PRN  We will sign off. Will arrange outpatient follow up.   Best practice (evaluated daily)  Per primary  Goals of Care:  Per Primary  Labs   CBC: Recent Labs  Lab 05/22/20 1608 05/23/20 0450 05/24/20 0333  WBC 15.2* 14.3* 9.6  HGB 13.6 10.9* 10.4*  HCT 45.0 35.6* 34.9*  MCV 86.7 85.8 86.6  PLT 421* 304 621    Basic Metabolic Panel: Recent Labs  Lab 05/22/20 1608 05/23/20 0450 05/24/20 0333  NA 139 134* 138  K 4.6 4.2 4.0  CL 105 104 108  CO2 22 20* 21*  GLUCOSE 202* 198* 137*  BUN 24* 22 18  CREATININE 1.01* 0.94 0.92  CALCIUM 9.0 7.7* 8.0*   GFR: Estimated Creatinine Clearance: 54.7 mL/min (by C-G formula based on SCr of 0.92 mg/dL). Recent Labs  Lab 05/22/20 1608 05/23/20 0450 05/24/20 0333  WBC 15.2* 14.3* 9.6    Liver Function Tests: Recent Labs  Lab 05/22/20 1800 05/23/20 0450  AST 18 10*  ALT 14 12  ALKPHOS 69 57  BILITOT 0.9 1.0  PROT 7.0 5.9*  ALBUMIN 3.2* 2.9*   No results for input(s): LIPASE, AMYLASE in the last 168 hours. No results for input(s): AMMONIA in the last 168 hours.  ABG No results found for: PHART, PCO2ART, PO2ART, HCO3, TCO2, ACIDBASEDEF, O2SAT   Coagulation Profile: No results for input(s): INR, PROTIME in the last 168 hours.  Cardiac Enzymes: No results for input(s): CKTOTAL, CKMB, CKMBINDEX, TROPONINI in the last 168 hours.  HbA1C: No results found for: HGBA1C  CBG: Recent Labs  Lab 05/23/20 1611 05/23/20 2016 05/24/20 0735 05/24/20 1157 05/24/20 1641  GLUCAP 163* 245* 132* 198* 261*    Review of  Systems:   No orthopnea or PND. No LE swelling. Comprehensive ROS otherwise negative.   Past Medical History:  She,  has a past medical history of Asthma, Complication of anesthesia, Diabetes mellitus without complication ([place]), Emphysema, Fibromyalgia, Osteoporosis, Rheumatoid arthritis(714.0), and Sleep apnea.   Surgical History:   Past Surgical History:  Procedure Laterality Date  . ABDOMINAL HYSTERECTOMY    . ANKLE FRACTURE SURGERY  2007  . BREAST SURGERY     mammoplasty and then removed  . FOOT SURGERY     numerous  . I & D EXTREMITY Right 10/26/2014   Procedure: IRRIGATION AND DEBRIDEMENT RIGHT KNEE ;  Surgeon: Gaynelle Arabian, MD;  Location: [facility];  Service: Orthopedics;  Laterality: Right;  . IRRIGATION AND DEBRIDEMENT KNEE Right 03/09/2017   Procedure: IRRIGATION AND DEBRIDEMENT RIGHT KNEE PRE-PATELLAR BURSA;  Surgeon: Susa Day, MD;  Location: [facility];  Service: Orthopedics;  Laterality: Right;  . KNEE BURSECTOMY Right 09/16/2014   Procedure: EXCISON OF PRE PATELLA BURSA RIGHT KNEE ;  Surgeon: Gaynelle Arabian, MD;  Location: [facility];  Service: Orthopedics;  Laterality: Right;  .  PARTIAL HYSTERECTOMY  1970  . TOTAL KNEE ARTHROPLASTY  2005 / 2006   x2     Social History:   reports that she quit smoking about 26 years ago. She has never used smokeless tobacco. She reports that she does not drink alcohol and does not use drugs.   Family History:  Her family history includes Breast cancer in her paternal grandmother; Cancer in her paternal grandmother and paternal uncle; Diabetes in her father, maternal aunt, mother, sister, and another family member; Hypertension in an other family member; Stroke in an other family member.   Allergies Allergies  Allergen Reactions  . Penicillins Hives    Has patient had a PCN reaction causing immediate rash, facial/tongue/throat swelling, SOB or lightheadedness with hypotension: Yes Has patient had a PCN reaction causing severe rash involving  mucus membranes or skin necrosis: No Has patient had a PCN reaction that required hospitalization: No Has patient had a PCN reaction occurring within the last 10 years: No If all of the above answers are "NO", then may proceed with Cephalosporin use. Patient has tolerated ceftriaxone in the recent past.  . Sulfamethoxazole-Trimethoprim Nausea And Vomiting    REACTION: stomach problems  . Meperidine Other (See Comments)  . Sulfa Antibiotics Nausea And Vomiting    REACTION: stomach problems  . Sulfonamide Derivatives Nausea And Vomiting    REACTION: stomach problems     Home Medications  Prior to Admission medications   Medication Sig Start Date End Date Taking? Authorizing Provider  albuterol (VENTOLIN HFA) 108 (90 Base) MCG/ACT inhaler  07/05/19  Yes [provider]  atorvastatin (LIPITOR) 20 MG tablet Take 20 mg by mouth daily. 11/29/19   [provider]  B-D UF III MINI PEN NEEDLES 31G X 5 MM MISC SMARTSIG:1 Each SUB-Q 4 Times Daily 10/22/19   [provider]  cholecalciferol (VITAMIN D) 1000 UNITS tablet Take 1,000 Units by mouth at bedtime.     [provider]  clotrimazole-betamethasone (LOTRISONE) cream Apply topically. 10/07/19   [provider]  Cyanocobalamin (B-12) 2500 MCG TABS Take 2,500 mcg by mouth at bedtime.     [provider]  DULoxetine (CYMBALTA) 60 MG capsule Take 60 mg by mouth at bedtime.     [provider]  ezetimibe (ZETIA) 10 MG tablet Take 10 mg by mouth at bedtime.  11/04/16   [provider]  ferrous sulfate 325 (65 FE) MG tablet Take 325 mg by mouth at bedtime.     [provider]  gabapentin (NEURONTIN) 100 MG capsule Take 100 mg by mouth 3 (three) times daily. 11/17/19   [provider]  insulin NPH-regular Human (70-30) 100 UNIT/ML injection Inject 14 Units into the skin 2 (two) times daily with a meal.    [provider]  Multiple Vitamin (MULTIVITAMIN WITH  MINERALS) TABS tablet Take 1 tablet by mouth at bedtime.    [provider]  MYRBETRIQ 50 MG TB24 tablet Take 50 mg by mouth daily. 03/29/20   [provider]  Omega-3 Fatty Acids (FISH OIL) 1200 MG CAPS Take 1,200 mg by mouth at bedtime.     [provider]  [facility] VERIO test strip  10/13/18   [provider]  predniSONE (DELTASONE) 5 MG tablet Take 5 mg by mouth daily with breakfast.  Patient not taking: Reported on 05/22/2020    [provider]  QVAR REDIHALER 40 MCG/ACT inhaler Inhale into the lungs. 03/16/20   [provider]  Critical care time: n/a

## 2020-05-24 NOTE — TOC Initial Note (Signed)
Transition of Care [facility]) - Initial/Assessment Note    Patient Details  Name: [name] MRN: [id number] Date of Birth: 1940/12/01  [facility]) CM/SW Contact:    Ross Ludwig, LCSW Phone Number: 05/24/2020, 5:31 PM  Clinical Narrative:                  Patient is a 80 year old female who is alert and oriented x4 and lives alone.  Patient's son lives across the street and checks on her regularly, but works full time, same as patient's daughter. CSW met with patient and her son to discuss disposition plan.  Per patient she does want to go to SNF, she would rather go home with home health services.  However patient and family are discussing getting private care givers in place along with home health.    CSW discussed the advantages of going to a SNF for rehab, and how insurance will pay for the stay.  Patient and family were also discussing possibly eventually working on going to an ALF or staying at a SNF long term.  Patient and family were informed how much it may cost to pay in home caregivers and what services are available.  CSW provided patient and family a list of agencies that do private in home care.  CSW discussed how often home health may be able to come to the home and time frame about how long they can stay.  CSW also provided a list of ALFs for patient and her family to review and get costs if they decide to pursue ALF placement.  CSW explained the advantages of having a home health agency come to the home, and also the advantages of going to a SNF for some rehab.  CSW talked about if she goes to SNF, then she can buy herself some time to find good caregivers for in home, along with working on trying to get her off of oxygen again.  Patient stated she normally does not have oxygen, and usually does not ambulate much.  Patient usually uses an electric wheelchair to get around in her home.  Patient would prefer to go home with home health and private care givers, but she  will consider SNF for short term.  CSW was given permission to begin bed search in [place] and [place].   Expected Discharge Plan: Skilled Nursing Facility Barriers to Discharge: Continued Medical Work up   Patient Goals and CMS Choice Patient states their goals for this hospitalization and ongoing recovery are:: To either go home with home health and care givers or SNF for rehab. CMS Medicare.gov Compare Post Acute Care list provided to:: Patient Choice offered to / list presented to : [place]  Expected Discharge Plan and Services Expected Discharge Plan: [place] Choice: [place] Living arrangements for the past 2 months: Single Family Home                                      Prior Living Arrangements/Services Living arrangements for the past 2 months: Single Family Home Lives with:: Self Patient language and need for interpreter reviewed:: Yes Do you feel safe going back to the place where you live?: No   Patient's family would like to have patient go to SNF first before going home, however patient wants to go home with  care givers.  Need for Family Participation in Patient Care: No (Comment) Care giver support system in place?: No (comment)   Criminal Activity/Legal Involvement Pertinent to Current Situation/Hospitalization: No - Comment as needed  Activities of Daily Living Home Assistive Devices/Equipment: Electric scooter,Bedside commode/3-in-1,CBG Meter,Other (Comment) (2 shower chairs that butt together) ADL Screening (condition at time of admission) Patient's cognitive ability adequate to safely complete daily activities?: Yes Is the patient deaf or have difficulty hearing?: No Does the patient have difficulty seeing, even when wearing glasses/contacts?: No Does the patient have difficulty concentrating, remembering, or making decisions?: No Patient able to express need for  assistance with ADLs?: Yes Does the patient have difficulty dressing or bathing?: Yes Independently performs ADLs?: No Communication: Independent Dressing (OT): Needs assistance Is this a change from baseline?: Change from baseline, expected to last >3 days Grooming: Independent Feeding: Needs assistance Is this a change from baseline?: Change from baseline, expected to last >3 days Bathing: Needs assistance Is this a change from baseline?: Change from baseline, expected to last >3 days Toileting: Needs assistance Is this a change from baseline?: Change from baseline, expected to last >3days In/Out Bed: Needs assistance Is this a change from baseline?: Change from baseline, expected to last >3 days Walks in Home: Needs assistance Is this a change from baseline?: Change from baseline, expected to last >3 days Does the patient have difficulty walking or climbing stairs?: Yes Weakness of Legs: Both Weakness of Arms/Hands: None  Permission Sought/Granted Permission sought to share information with : Facility Contact Representative,Family Supports Permission granted to share information with : Yes, Verbal Permission Granted,Yes, Release of Information Signed  Share Information with NAME: Emiliano Dyer Daughter   [phone]  Buck Mam [phone]  [phone]  Permission granted to share info w AGENCY: SNF admissions        Emotional Assessment Appearance:: Appears stated age Attitude/Demeanor/Rapport: Engaged Affect (typically observed): Accepting,Appropriate,Calm,Stable Orientation: : Oriented to Self,Oriented to Place,Oriented to  Time,Oriented to Situation Alcohol / Substance Use: Not Applicable Psych Involvement: No (comment)  Admission diagnosis:  Hypoxia [R09.02] Acute respiratory failure with hypoxia (HCC) [J96.01] Chest pain, unspecified type [R07.9] Patient Active Problem List   Diagnosis Date Noted  . Hypoxia 05/23/2020  . Acute respiratory failure with  hypoxia ([place]) 05/22/2020  . Rotator cuff arthropathy of left shoulder 12/13/2019  . Pain in joint of right shoulder 12/10/2019  . Pain in joint of left shoulder 12/10/2019  . Hearing loss, sensorineural, 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  . Tinnitus, left 01/08/2019  . DM type 2, not at goal [facility]) 06/03/2018  . Frequent infections 05/21/2018  . Gangrene ([place]) 05/21/2018  . Pain in finger 05/07/2018  . Family history of aneurysm 05/07/2018  . Diabetes mellitus due to underlying condition, controlled, with complication, without long-term current use of insulin ([place])   . Lower urinary tract infectious disease   . Palpitations 01/17/2018  . Dehydration 01/17/2018  . Hypotension 01/17/2018  . Symptom of blood in stool 01/17/2018  . Diarrhea in adult patient 01/17/2018  . 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  . Aftercare following joint replacement 08/21/2017  . 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 Closed displaced transverse fracture of right patella 05/06/2017  . Anemia 04/16/2017  . Chronic interstitial lung disease ([place]) 04/16/2017  . Chronic kidney disease (CKD), stage III (moderate) ([place]) 04/16/2017  . Other hyperlipidemia 04/16/2017  . Type 2 diabetes mellitus without complication, without long-term current  use of insulin ([place]) 04/16/2017  . Pain in right knee 03/25/2017  . Aftercare 03/25/2017  . Arthritis, septic, knee ([place]) 03/09/2017  . 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 Fibromyalgia 06/04/2016  . High risk medication use 06/04/2016  . Claw toe, acquired, left 01/29/2016  . Rheumatoid arthritis involving both feet with negative rheumatoid factor ([place]) 01/29/2016  . Septic prepatellar bursitis of right knee 10/26/2014  . Prepatellar bursitis of right knee 08/28/2014  . DYSPHAGIA ORAL PHASE 06/18/2007  . Asthma 05/14/2007  . SLEEP APNEA 05/14/2007  .  Sleep apnea 05/14/2007  . GERD 05/13/2007  . Rheumatoid arthritis ([place]) 05/13/2007  . OSTEOPOROSIS 05/13/2007  . DIARRHEA, CHRONIC 05/13/2007   PCP:  [name], MD Pharmacy:   [place], [place] - [address]. [place] [address] Phone: [phone] Fax: [phone]  [facility] [place] [facility] [employer] [facility] [place] [address] Phone: [phone] Fax: [phone]     Social Determinants of Health (SDOH) Interventions    Readmission Risk Interventions No flowsheet data found.

## 2020-05-24 NOTE — TOC Progression Note (Signed)
Transition of Care [facility]) - Progression Note    Patient Details  Name: [name] MRN: [id number] Date of Birth: [birth year]  Transition of Care [facility]) CM/SW Contact  Ross Ludwig, [place] Phone Number: 05-23-20, 12:00pm  Clinical Narrative:     CSW was informed that patient would like to speak to CSW.  CSW attempted to contact her and she was sleeping.  CSW to try again tomorrow.       Expected Discharge Plan and Services                                                 Social Determinants of Health (SDOH) Interventions    Readmission Risk Interventions No flowsheet data found.

## 2020-05-24 NOTE — Progress Notes (Signed)
Received a page for patient support offered by RN and requested by the patient.  Patient and her son and daughter are trying to discern next steps as to whether the patient can go back home or to a facility.  Patient was not anxious in talking about the challenges with the discission process and seems to understand her family is coming from a place of love and concern and patient does not want to be a burden.  Patient had a dog at home she misses.  Patient walked through some of her health history. Chaplain offered ministry of presence, empathetic/reflective listening.  Patient is somewhat anxious about the possibility of going to a facility and never going home.  Chaplain offered understand and encouraged the patient to be open to further conversation with her family and to share feelings of worry.  Chaplain offered prayer and Scripture. [place], Mdiv.     05/24/20 1847  Clinical Encounter Type  Visited With Patient;Health care provider  Visit Type Psychological support;Spiritual support  Referral From Nurse;Patient  Consult/Referral To Chaplain  Spiritual Encounters  Spiritual Needs Prayer  Stress Factors  Patient Stress Factors Health changes

## 2020-05-24 NOTE — TOC Progression Note (Signed)
Transition of Care [facility]) - Progression Note    Patient Details  Name: [name] MRN: [id number] Date of Birth: [birth year]  Transition of Care [facility]) CM/SW Contact  Ross Ludwig, [place] Phone Number: 05/24/2020, 10:07 AM  Clinical Narrative:     CSW attempted to speak to patient again, and she was sleeping, CSW to try at a later time.     Expected Discharge Plan and Services                                                 Social Determinants of Health (SDOH) Interventions    Readmission Risk Interventions No flowsheet data found.

## 2020-05-25 ENCOUNTER — Telehealth: Payer: Self-pay

## 2020-05-25 LAB — BASIC METABOLIC PANEL
Anion gap: 10 (ref 5–15)
BUN: 22 mg/dL (ref 8–23)
CO2: 23 mmol/L (ref 22–32)
Calcium: 8.1 mg/dL — ABNORMAL LOW (ref 8.9–10.3)
Chloride: 109 mmol/L (ref 98–111)
Creatinine, Ser: 1.04 mg/dL — ABNORMAL HIGH (ref 0.44–1.00)
GFR, Estimated: 55 mL/min — ABNORMAL LOW (ref >60)
Glucose, Bld: 205 mg/dL — ABNORMAL HIGH (ref 70–99)
Potassium: 4.3 mmol/L (ref 3.5–5.1)
Sodium: 142 mmol/L (ref 135–145)

## 2020-05-25 LAB — GLUCOSE, CAPILLARY
Glucose-Capillary: 135 mg/dL — ABNORMAL HIGH (ref 70–99)
Glucose-Capillary: 203 mg/dL — ABNORMAL HIGH (ref 70–99)
Glucose-Capillary: 180 mg/dL — ABNORMAL HIGH (ref 70–99)
Glucose-Capillary: 183 mg/dL — ABNORMAL HIGH (ref 70–99)

## 2020-05-25 MED ORDER — INSULIN ASPART PROT & ASPART (70-30 MIX) 100 UNIT/ML ~~LOC~~ SUSP
12.0000 [IU] | Freq: Two times a day (BID) | SUBCUTANEOUS | Status: DC
  Administered 2020-05-25 – 2020-05-26 (×2): 12 [IU] via SUBCUTANEOUS

## 2020-05-25 NOTE — Progress Notes (Signed)
Physical Therapy Treatment Patient Details Name: [name] MRN: [id number] DOB: April 23, 1940 Today's Date: 05/25/2020    History of Present Illness 80 year old with RA on chronic prednisone without evidence of significant ILD in the past who presents with right upper quadrant/right chest wall pain and shortness of breath with new hypoxemia. 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    PT Comments    Pt declines SNF stating she has "done that before" and that "I am as much rehabed as I am going to be".  Pt does NOT qualify for oxygen.  Assisted OOB to amb in hallway.  General Gait Details: tolerated a functional distance using a walker with RA > 90% and avg HR 107.  Pt tolerated amb 25 feet in hallway.  One seated rest break then amb another 25 feet back.  mild dyspnea and fatigue.General transfer comment: depending on level of height pt self able off elevated bed but then required assist from lower level chair.  Pt stated she has elevated toilets at home. Pt plans to return home where her Son lives across the street, pt has a wrist life alert, pt has a motor scooter, 2 walkers and can order her groceries if needed vs drive and get.    Follow Up Recommendations  SNF  Pt declines SNF so will need HHPT/no equipment   Equipment Recommendations  None recommended by PT    Recommendations for Other Services       Precautions / Restrictions Precautions Precautions: Fall Precaution Comments: pt reports 1 fall in the past 1 year Restrictions Weight Bearing Restrictions: No    Mobility  Bed Mobility Overal bed mobility: Modified Independent             General bed mobility comments: HOB up (pt has adjustable bed at home), used rail self able    Transfers Overall transfer level: Needs assistance Equipment used: None Transfers: Sit to/from [facility] Sit to Stand:  Supervision;Min guard;Min assist         General transfer comment: depending on level of height pt self able off elevated bed but then required assist from lower level chair.  Pt stated she has elevated toilets at home.  Ambulation/Gait Ambulation/Gait assistance: Supervision Gait Distance (Feet): 50 Feet (25 feet x 2 one seated rest break.) Assistive device: Rolling walker (2 wheeled) Gait Pattern/deviations: Step-through pattern;Decreased stride length;Trunk flexed Gait velocity: decreased   General Gait Details: tolerated a functional distance using a walker with RA > 90% and avg HR 107.  Pt tolerated amb 25 feet in hallway.  One seated rest break then amb another 25 feet back.  mild dyspnea and fatigue.   Stairs             Wheelchair Mobility    Modified Rankin (Stroke Patients Only)       Balance                                            Cognition Arousal/Alertness: Awake/alert Behavior During Therapy: WFL for tasks assessed/performed Overall Cognitive Status: Within Functional Limits for tasks assessed                                 General Comments: AxO X 3  very pleasant      Exercises      General Comments        Pertinent Vitals/Pain Pain Assessment: No/denies pain    Home Living                      Prior Function            PT Goals (current goals can now be found in the care plan section) Progress towards PT goals: Progressing toward goals    Frequency    Min 3X/week      PT Plan Current plan remains appropriate    Co-evaluation              AM-PAC PT "6 Clicks" Mobility   Outcome Measure  Help needed turning from your back to your side while in a flat bed without using bedrails?: None Help needed moving from lying on your back to sitting on the side of a flat bed without using bedrails?: None Help needed moving to and from a bed to a chair (including a wheelchair)?:  None Help needed standing up from a chair using your arms (e.g., wheelchair or bedside chair)?: None Help needed to walk in hospital room?: A Little Help needed climbing 3-5 steps with a railing? : A Little 6 Click Score: 22    End of Session Equipment Utilized During Treatment: Gait belt Activity Tolerance: Patient tolerated treatment well Patient left: in bed;with call bell/phone within reach Nurse Communication: Mobility status PT Visit Diagnosis: Difficulty in walking, not elsewhere classified (R26.2)     Time: 1430-1455 PT Time Calculation (min) (ACUTE ONLY): 25 min  Charges:  $Gait Training: 8-22 mins $Therapeutic Activity: 8-22 mins                     Rica Koyanagi  PTA Acute  Rehabilitation Services Pager      [phone] Office      [phone]

## 2020-05-25 NOTE — TOC Progression Note (Addendum)
Transition of Care [facility]) - Progression Note    Patient Details  Name: MAGDALENA SKILTON MRN: [id number] Date of Birth: 10-07-1940  [facility] CM/SW Contact  Jeany Seville, [place], [place] Phone Number: 05/25/2020, 5:55 PM  Clinical Narrative:     CSW spoke to patient and her daughter, patient has decided she wants to go home with home health services.  CSW discussed different home health agencies, she did not have a preference.  CSW contacted [facility], and they can accept patient for home health PT, OT, Aide, and Education officer, museum.  Patient asked CSW about recommendations for ALFs since patient is considering ALF if her finances work out.  CSW told her that unfortunately CSW can not give recommendations.  Patient asked about [facility], and CSW informed her that if she is related to a Mason, sometimes that can help her get into the ALF sooner.  Per patient her father, and brother in law were masons, [place] contacted [employer], they will contact patient and family next week to discuss if it is a possibility.  In the meantime, patient and family are going to work on trying to find in home care givers with the assistance of [facility] agency.  CSW updated attending physician about disposition plan and also emailed patient's daughter [name] with information that was discussed.  Patient may be eligible for [facility] services, CSW made referral to [facility] to follow patient.  Per patient she does not need any other equipment, CSW to continue to follow patient's progress throughout discharge planning.   Expected Discharge Plan: [place] Barriers to Discharge: Continued Medical Work up  Expected Discharge Plan and Services Expected Discharge Plan: [place] Choice: [place] Living arrangements for the past 2 months: Single Family Home                                       Social Determinants of Health (SDOH)  Interventions    Readmission Risk Interventions No flowsheet data found.

## 2020-05-25 NOTE — Telephone Encounter (Signed)
-----   Message from Marshell Garfinkel, MD sent at 05/25/2020  9:13 AM EDT ----- Regarding: RE: clinic f/u Still no ILD on CT scan.  I think she can follow with Dr. Silas Flood as it has been 4 years since she saw me.  Please make PFT and clinic visit appointments with him  Thanks Praveen  ----- Message ----- From: Lanier Clam, MD Sent: 05/24/2020   5:51 PM EDT To: Marshell Garfinkel, MD, [facility] Triage Pool Subject: clinic f/u                                     Hello -   This patient followed with Dr. Vaughan Browner, last seen 2018. Needs clinic follow up (hosp f/u)  with PFTs in 4-8 weeks. New O2 in setting of splinting from pain, atelectasis, emphysema. Defer to Dr. Vaughan Browner if he wants to see her again (RA, no ILD in past). Otherwise I am happy to see her for follow up.  Thanks, [facility]

## 2020-05-25 NOTE — Telephone Encounter (Signed)
LMTCB  Appt made for:  PFT 07/03/20 @ 9:00am Appt 07/03/20 @ 10:15am with PM  When patient calls back please confirm this is okay and schedule covid testing.

## 2020-05-25 NOTE — Telephone Encounter (Signed)
Appt made with MH. Will close this message since there is a duplicate message already being worked on.   Nothing further needed at this time.

## 2020-05-25 NOTE — Plan of Care (Signed)
  Problem: Coping: Goal: Level of anxiety will decrease Outcome: Progressing   Problem: Safety: Goal: Ability to remain free from injury will improve Outcome: Progressing   Problem: Skin Integrity: Goal: Risk for impaired skin integrity will decrease Outcome: Progressing   

## 2020-05-25 NOTE — Telephone Encounter (Signed)
See other phone message. PM requested patient be scheduled with MH.  Appt made. Awaiting call back from patient.   Will hold in triage.

## 2020-05-25 NOTE — Consult Note (Signed)
   [facility] CM Inpatient Consult   05/25/2020  [name] 03-Jan-1941 [id number]   Referral received from inpatient LCSW for potential [facility] care management services in home post hospital discharge. Patient recommended for skilled nursing facility but, per chart review, is requesting to go home. Attempted to speak with patient by telephone in hospital room but no answer.   Plan:Will continue to follow for disposition and, if patient discharges home, will make referral to [facility] CM for post hospital assessment of community needs.  Of note, [facility] Care Management services does not replace or interfere with any services that are arranged by inpatient case management or social work.  Netta Cedars, MSN, [facility] Liaison Nurse Mobile Phone [phone]  Toll free office [phone]

## 2020-05-25 NOTE — Progress Notes (Signed)
PROGRESS NOTE    [name]  LTJ:[id number] DOB: [birth year] DOA: 05/22/2020 PCP: [name], MD   Chief Complaint  Patient presents with  . Fall  . rib cage pain  . Chest Pain  Brief Narrative: 80 year old female with acquired hypogammaglobinemia, anemia, asthma/chronic lung disease, CKD stage 3, diabetes, dysphagia, fibromyalgia,chronic pain, GERD, hearing loss, RA, OSA admitted with chest pain abdominal pain, with history of fall 3 to 4 weeks ago on the right side, no pain initially but woke up 3/15 with severe right-sided pain on her chest and right abdominal wall.  Has had some shortness of breath, chronic diarrhea but no fever nausea vomiting. In the ED,noted to be hypoxic 88% needing 2 L oxygen to maintain saturation in low to mid 90s, tachycardic in 100 intermittent tachypneic blood pressure stable blood work fairly stable except for leukocytosis, showed CMP with BUN 24,glucose 202, albumin 3.2.CBC with leukocytosis to 15, platelets 421.Troponin negative x2.  BNP normal.Respiratory panel flu COVID-negative,UA glucose 50 ketones 5,chest x-ray no acute finding underwent CTA chest and at abd/pelvis w/ contrast-no PE, mild bilateral scaring and or atelectasis, chronic posterior 10th and 11th left rib fractures, emphysema.large stable splenic cyst hemangioma. PCCM consulted  Subjective: Seen this morning patient reports significant improvement with minimal pain tenderness on the right chest.  Was on oxygen this morning but was able to come off during ambulation today.   Would like to think about going to skilled nursing facility  Assessment & Plan:  Right lower chest wall pain-appears musculoskeletal Acute respiratory failure with hypoxia consistent with hypoventilation in the setting of pain. Asthma/emphysema-PFTs 2018 with fixed obstruction DLCO moderately reduced  OSA Chest wall pain is significantly better likely musculoskeletal. CTA negative for PE, troponin negative x2 BNP  normal.  Recent history of fall but no fractures on the x-rays.  History of chronic neck pain. Her hypoxia is mostly from hypoventilation which seems to be improving as pain is almost resolved.  Continue PT OT incentive spirometry, Pulmicort and p.o. steroid for RA. Appreciate pulmonary input.   Cervical DJD/Neck pain- chronic.  Pain is controlled. RA with significant upper and lower extremity deformities: Continue her prednisone, pain control. OSA does not tolerate CPAP.  Outpatient follow-up. Leukocytosis mild likely from steroid-at times takes 20 mg when in increased pain which she did on Saturday or Sunday.  No obvious infection in urine or CT chest abdomen.  It has resolved. Recent Labs  Lab 05/22/20 1608 05/23/20 0450 05/24/20 0333  WBC 15.2* 14.3* 9.6   Fibromyalgia/chronic pain: Continue her Cymbalta Neurontin.  Continue pain management as above  GERD continue PPI  Type 2 diabetes on long-term insulin: Blood sugar fairly borderline controlled increase 70-30 units twice daily at home normally 14 units twice daily. Cont ssi. Recent Labs  Lab 05/24/20 1157 05/24/20 1641 05/24/20 2123 05/25/20 0715 05/25/20 1116  GLUCAP 198* 261* 260* 135* 203*   CKD stage IIIa-renal function stable.  Outpatient follow-up Recent Labs  Lab 05/22/20 1608 05/23/20 0450 05/24/20 0333 05/25/20 0317  BUN 24* 22 18 22   CREATININE 1.01* 0.94 0.92 1.04*   Overweight with BMI 28:Will benefit with weight loss PCP follow-up.  Deconditioning generalized weakness with multiple comorbidities: PT OT/static skilled nursing facility but patient appears to be not interested.  After much discussion she is going to think about it.  If declines will plan for home with home health/private care tomorrow  Diet Order            Diet regular  Room service appropriate? Yes; Fluid consistency: Thin  Diet effective now               Patient's Body mass index is 29.71 kg/m. DVT prophylaxis: enoxaparin  (LOVENOX) injection 40 mg Start: 05/23/20 1000 Code Status:   Code Status: Full Code  Family Communication: plan of care discussed with patient at bedside. Declined my request to update her son,reports she has been talking to her son. She lives alone, has dog and all necessary equipments at home.  Status is: Inpatient Remains hospitalized for ongoing management of hypoxia chest pain and deconditioning, unsafe discharge.   Dispo: The patient is from: Home              Anticipated d/c is to: SNF- wants to think about it- If declines will plan for home with home health/private care tomorrow              Patient currently is not medically stable to d/c.   Difficult to place patient No   Unresulted Labs (From admission, onward)          Start     Ordered   05/29/20 0500  Creatinine, serum  (enoxaparin (LOVENOX)    CrCl >/= 30 ml/min)  Weekly,   R     Comments: while on enoxaparin therapy    05/22/20 2239   05/24/20 6195  Basic metabolic panel  Daily,   R     Question:  Specimen collection method  Answer:  Lab=Lab collect   05/23/20 0803        Medications reviewed:  Scheduled Meds: . arformoterol  15 mcg Nebulization BID  . atorvastatin  20 mg Oral Daily  . budesonide (PULMICORT) nebulizer solution  0.25 mg Nebulization BID  . diclofenac Sodium  2 g Topical QID  . DULoxetine  60 mg Oral QHS  . enoxaparin (LOVENOX) injection  40 mg Subcutaneous Q24H  . ezetimibe  10 mg Oral QHS  . ferrous sulfate  325 mg Oral QHS  . gabapentin  100 mg Oral TID  . insulin aspart  0-9 Units Subcutaneous TID WC  . insulin aspart protamine- aspart  12 Units Subcutaneous BID WC  . mirabegron ER  50 mg Oral Daily  . pantoprazole  40 mg Oral Daily  . predniSONE  5 mg Oral Q breakfast  . sodium chloride flush  3 mL Intravenous Q12H  . vitamin B-12  2,500 mcg Oral QHS   Continuous Infusions:  Consultants:see note  Procedures:see note  Antimicrobials: Anti-infectives (From admission, onward)    None     Culture/Microbiology    Component Value Date/Time   SDES BLOOD LEFT ANTECUBITAL 01/17/2018 1630   SPECREQUEST  01/17/2018 1630    BOTTLES DRAWN AEROBIC AND ANAEROBIC Blood Culture adequate volume   CULT  01/17/2018 1630    NO GROWTH 5 DAYS Performed at [facility], [place] [address], [place], [place] [address]    REPTSTATUS 01/22/2018 FINAL 01/17/2018 1630    Other culture-see note  Objective: Vitals: Today's Vitals   05/25/20 0358 05/25/20 0500 05/25/20 0827 05/25/20 1312  BP: 125/71   130/65  Pulse: 87   96  Resp: 20   20  Temp: 98.3 F (36.8 C)   98 F (36.7 C)  TempSrc: Oral   Oral  SpO2: 98%  99% 98%  Weight:  83.5 kg    Height:      PainSc:        Intake/Output Summary (Last 24  hours) at 05/25/2020 1421 Last data filed at 05/25/2020 0600 Gross per 24 hour  Intake 480 ml  Output --  Net 480 ml   Filed Weights   05/22/20 1318 05/24/20 0500 05/25/20 0500  Weight: 79.4 kg 82.8 kg 83.5 kg   Weight change: 0.7 kg  Intake/Output from previous day: 03/16 0701 - 03/17 0700 In: 480 [P.O.:480] Out: -  Intake/Output this shift: No intake/output data recorded. Filed Weights   05/22/20 1318 05/24/20 0500 05/25/20 0500  Weight: 79.4 kg 82.8 kg 83.5 kg    Examination: General exam: AAOx3,NAD, weak appearing. HEENT:Oral mucosa moist, Ear/Nose WNL grossly, dentition normal. Respiratory system: bilaterally mild basal crackles present improved from before,no use of accessory muscle Cardiovascular system: S1 & S2 +, No JVD,. Gastrointestinal system: Abdomen soft, NT,ND, BS+ Nervous System:Alert, awake, moving extremities and grossly nonfocal Extremities: Deformities in foot and hand, from RA- no edema, distal peripheral pulses palpable.  Skin: No rashes,no icterus. MSK: Normal muscle bulk,tone, power  Data Reviewed: I have personally reviewed following labs and imaging studies CBC: Recent Labs  Lab 05/22/20 1608 05/23/20 0450 05/24/20 0333  WBC  15.2* 14.3* 9.6  HGB 13.6 10.9* 10.4*  HCT 45.0 35.6* 34.9*  MCV 86.7 85.8 86.6  PLT 421* 304 263   Basic Metabolic Panel: Recent Labs  Lab 05/22/20 1608 05/23/20 0450 05/24/20 0333 05/25/20 0317  NA 139 134* 138 142  K 4.6 4.2 4.0 4.3  CL 105 104 108 109  CO2 22 20* 21* 23  GLUCOSE 202* 198* 137* 205*  BUN 24* 22 18 22   CREATININE 1.01* 0.94 0.92 1.04*  CALCIUM 9.0 7.7* 8.0* 8.1*   GFR: Estimated Creatinine Clearance: 48.6 mL/min (A) (by C-G formula based on SCr of 1.04 mg/dL (H)). Liver Function Tests: Recent Labs  Lab 05/22/20 1800 05/23/20 0450  AST 18 10*  ALT 14 12  ALKPHOS 69 57  BILITOT 0.9 1.0  PROT 7.0 5.9*  ALBUMIN 3.2* 2.9*   No results for input(s): LIPASE, AMYLASE in the last 168 hours. No results for input(s): AMMONIA in the last 168 hours. Coagulation Profile: No results for input(s): INR, PROTIME in the last 168 hours. Cardiac Enzymes: No results for input(s): CKTOTAL, CKMB, CKMBINDEX, TROPONINI in the last 168 hours. BNP (last 3 results) No results for input(s): PROBNP in the last 8760 hours. HbA1C: No results for input(s): HGBA1C in the last 72 hours. CBG: Recent Labs  Lab 05/24/20 1157 05/24/20 1641 05/24/20 2123 05/25/20 0715 05/25/20 1116  GLUCAP 198* 261* 260* 135* 203*   Lipid Profile: No results for input(s): CHOL, HDL, LDLCALC, TRIG, CHOLHDL, LDLDIRECT in the last 72 hours. Thyroid Function Tests: No results for input(s): TSH, T4TOTAL, FREET4, T3FREE, THYROIDAB in the last 72 hours. Anemia Panel: No results for input(s): VITAMINB12, FOLATE, FERRITIN, TIBC, IRON, RETICCTPCT in the last 72 hours. Sepsis Labs: No results for input(s): PROCALCITON, LATICACIDVEN in the last 168 hours.  Recent Results (from the past 240 hour(s))  Resp Panel by RT-PCR (Flu A&B, Covid) Nasopharyngeal Swab     Status: None   Collection Time: 05/23/20 12:30 AM   Specimen: Nasopharyngeal Swab; Nasopharyngeal(NP) swabs in vial transport medium  Result  Value Ref Range Status   SARS Coronavirus 2 by RT PCR NEGATIVE NEGATIVE Final    Comment: (NOTE) SARS-CoV-2 target nucleic acids are NOT DETECTED.  The SARS-CoV-2 RNA is generally detectable in upper respiratory specimens during the acute phase of infection. The lowest concentration of SARS-CoV-2 viral copies this assay can  detect is 138 copies/mL. A negative result does not preclude SARS-Cov-2 infection and should not be used as the sole basis for treatment or other patient management decisions. A negative result may occur with  improper specimen collection/handling, submission of specimen other than nasopharyngeal swab, presence of viral mutation(s) within the areas targeted by this assay, and inadequate number of viral copies(<138 copies/mL). A negative result must be combined with clinical observations, patient history, and epidemiological information. The expected result is Negative.  Fact Sheet for Patients:  EntrepreneurPulse.com.au  Fact Sheet for Healthcare Providers:  IncredibleEmployment.be  This test is no t yet approved or cleared by the [place] FDA and  has been authorized for detection and/or diagnosis of SARS-CoV-2 by FDA under an Emergency Use Authorization (EUA). This EUA will remain  in effect (meaning this test can be used) for the duration of the COVID-19 declaration under Section 564(b)(1) of the Act, 21 U.S.C.section 360bbb-3(b)(1), unless the authorization is terminated  or revoked sooner.       Influenza A by PCR NEGATIVE NEGATIVE Final   Influenza B by PCR NEGATIVE NEGATIVE Final    Comment: (NOTE) The Xpert Xpress SARS-CoV-2/FLU/RSV plus assay is intended as an aid in the diagnosis of influenza from Nasopharyngeal swab specimens and should not be used as a sole basis for treatment. Nasal washings and aspirates are unacceptable for Xpert Xpress SARS-CoV-2/FLU/RSV testing.  Fact Sheet for  Patients: EntrepreneurPulse.com.au  Fact Sheet for Healthcare Providers: IncredibleEmployment.be  This test is not yet approved or cleared by the [place] FDA and has been authorized for detection and/or diagnosis of SARS-CoV-2 by FDA under an Emergency Use Authorization (EUA). This EUA will remain in effect (meaning this test can be used) for the duration of the COVID-19 declaration under Section 564(b)(1) of the Act, 21 U.S.C. section 360bbb-3(b)(1), unless the authorization is terminated or revoked.  Performed at [facility], [place] [address], [place], [place] [address]      Radiology Studies: No results found.   LOS: 2 days   [name], MD [facility]  05/25/2020, 2:21 PM

## 2020-05-25 NOTE — Telephone Encounter (Signed)
-----   Message from Lanier Clam, MD sent at 05/24/2020  5:45 PM EDT ----- Regarding: clinic f/u Hello -   This patient followed with Dr. Vaughan Browner, last seen 2018. Needs clinic follow up (hosp f/u)  with PFTs in 4-8 weeks. New O2 in setting of splinting from pain, atelectasis, emphysema. Defer to Dr. Vaughan Browner if he wants to see her again (RA, no ILD in past). Otherwise I am happy to see her for follow up.  Thanks, [facility]

## 2020-05-25 NOTE — Progress Notes (Signed)
Inpatient Diabetes Program Recommendations  AACE/ADA: New Consensus Statement on Inpatient Glycemic Control   Target Ranges:  Prepandial:   less than 140 mg/dL      Peak postprandial:   less than 180 mg/dL (1-2 hours)      Critically ill patients:  140 - 180 mg/dL  Results for MADLINE, OESTERLING (MRN [id number]) as of 05/25/2020 12:14  Ref. Range 05/24/2020 07:35 05/24/2020 11:57 05/24/2020 16:41 05/24/2020 21:23 05/25/2020 07:15 05/25/2020 11:16  Glucose-Capillary Latest Ref Range: 70 - 99 mg/dL 132 (H) 198 (H) 261 (H) 260 (H) 135 (H) 203 (H)    Review of Glycemic Control  Diabetes history: DM Outpatient Diabetes medications: 70/30 14 units BID Current orders for Inpatient glycemic control: 70/30 10 units BID, Novolog 0-9 units TID with meals; Prednisone 5 mg QAM  Inpatient Diabetes Program Recommendations:    Insulin: Please consider increasing 70/30 to 12 units BID.  Thanks, Barnie Alderman, RN, MSN, CDE Diabetes Coordinator Inpatient Diabetes Program [phone] (Team Pager from 8am to 5pm)

## 2020-05-25 NOTE — Telephone Encounter (Signed)
LMTCB

## 2020-05-26 ENCOUNTER — Other Ambulatory Visit: Payer: Self-pay

## 2020-05-26 DIAGNOSIS — E1122 Type 2 diabetes mellitus with diabetic chronic kidney disease: Secondary | ICD-10-CM | POA: Diagnosis not present

## 2020-05-26 DIAGNOSIS — N183 Chronic kidney disease, stage 3 unspecified: Secondary | ICD-10-CM

## 2020-05-26 DIAGNOSIS — M797 Fibromyalgia: Secondary | ICD-10-CM | POA: Diagnosis not present

## 2020-05-26 DIAGNOSIS — M069 Rheumatoid arthritis, unspecified: Secondary | ICD-10-CM | POA: Diagnosis not present

## 2020-05-26 DIAGNOSIS — J849 Interstitial pulmonary disease, unspecified: Secondary | ICD-10-CM | POA: Diagnosis not present

## 2020-05-26 DIAGNOSIS — I700 Atherosclerosis of aorta: Secondary | ICD-10-CM | POA: Diagnosis not present

## 2020-05-26 DIAGNOSIS — G8929 Other chronic pain: Secondary | ICD-10-CM | POA: Diagnosis not present

## 2020-05-26 DIAGNOSIS — E1151 Type 2 diabetes mellitus with diabetic peripheral angiopathy without gangrene: Secondary | ICD-10-CM | POA: Diagnosis not present

## 2020-05-26 DIAGNOSIS — D801 Nonfamilial hypogammaglobulinemia: Secondary | ICD-10-CM | POA: Diagnosis not present

## 2020-05-26 DIAGNOSIS — D631 Anemia in chronic kidney disease: Secondary | ICD-10-CM | POA: Diagnosis not present

## 2020-05-26 DIAGNOSIS — J45909 Unspecified asthma, uncomplicated: Secondary | ICD-10-CM | POA: Diagnosis not present

## 2020-05-26 DIAGNOSIS — J439 Emphysema, unspecified: Secondary | ICD-10-CM | POA: Diagnosis not present

## 2020-05-26 LAB — BASIC METABOLIC PANEL
Anion gap: 6 (ref 5–15)
BUN: 20 mg/dL (ref 8–23)
CO2: 23 mmol/L (ref 22–32)
Calcium: 8.4 mg/dL — ABNORMAL LOW (ref 8.9–10.3)
Chloride: 112 mmol/L — ABNORMAL HIGH (ref 98–111)
Creatinine, Ser: 1 mg/dL (ref 0.44–1.00)
GFR, Estimated: 58 mL/min — ABNORMAL LOW (ref >60)
Glucose, Bld: 145 mg/dL — ABNORMAL HIGH (ref 70–99)
Potassium: 4 mmol/L (ref 3.5–5.1)
Sodium: 141 mmol/L (ref 135–145)

## 2020-05-26 LAB — GLUCOSE, CAPILLARY
Glucose-Capillary: 143 mg/dL — ABNORMAL HIGH (ref 70–99)
Glucose-Capillary: 167 mg/dL — ABNORMAL HIGH (ref 70–99)

## 2020-05-26 MED ORDER — PANTOPRAZOLE SODIUM 40 MG PO TBEC: DELAYED_RELEASE_TABLET | ORAL | 5 refills | Status: AC

## 2020-05-26 MED ORDER — BUDESONIDE 0.25 MG/2ML IN SUSP: 0.2500 mg | Freq: Two times a day (BID) | RESPIRATORY_TRACT | 0 refills | Status: AC

## 2020-05-26 MED ORDER — ARFORMOTEROL TARTRATE 15 MCG/2ML IN NEBU: 15.0000 ug | INHALATION_SOLUTION | Freq: Two times a day (BID) | RESPIRATORY_TRACT | 0 refills | Status: DC

## 2020-05-26 MED ORDER — COMPRESSOR/NEBULIZER MISC: 0 refills | Status: DC

## 2020-05-26 NOTE — TOC Transition Note (Signed)
Transition of Care [facility]) - CM/SW Discharge Note   Patient Details  Name: NOVALYNN BRANAMAN MRN: [id number] Date of Birth: 1940-05-05  [facility]) CM/SW Contact:  Ross Ludwig, LCSW Phone Number: 05/26/2020, 1:15 PM   Clinical Narrative:     Patient was recommended for SNF, but patient has decided that she would rather go home with home health and work on trying to find in home care providers for her.  CSW spoke to the patient and her family, Lajean Manes will be seeing patient, and may be able to see her today or tomorrow.  Patient's son is aware that patient is discharging today.  Patient requested EMS transport back home due to weakness.  CSW signing off, patient to discharge back home today.   Final next level of care: [place] Barriers to Discharge: Barriers Resolved   Patient Goals and CMS Choice Patient states their goals for this hospitalization and ongoing recovery are:: To go home with home health. CMS Medicare.gov Compare Post Acute Care list provided to:: Patient Choice offered to / list presented to : Adult Children,Patient  Discharge Placement                       Discharge Plan and Services     Post Acute Care Choice: [place]          DME Arranged: Nebulizer machine DME Agency: Other - Comment [facility]) Date DME Agency Contacted: 05/26/20 Time DME Agency Contacted: 75 Representative spoke with at DME Agency: Brenton Grills HH Arranged: PT,OT,Nurse's Aide,Social Work [employer] Agency: [place] Date [place]: 05/26/20 Time [place]: [place] Representative spoke with at [place]: [place] Determinants of Health ([place]) Interventions     Readmission Risk Interventions No flowsheet data found.

## 2020-05-26 NOTE — Discharge Summary (Signed)
Physician Discharge Summary  [name] JFH:[id number] DOB: September 04, 1940 DOA: 05/22/2020  PCP: [name], MD  Admit date: 05/22/2020 Discharge date: 05/26/2020  Admitted From: home Disposition:  home  Recommendations for Outpatient Follow-up:  Follow up with PCP in 1-2 weeks Please obtain BMP/CBC in one week Please follow up on the following pending results:  Home Health:yes  Equipment/Devices: none  Discharge Condition: Stable Code Status:   Code Status: Full Code Diet recommendation:  Diet Order             Diet regular Room service appropriate? Yes; Fluid consistency: Thin  Diet effective now                    Brief/Interim Summary: 80 year old female with acquired hypogammaglobinemia, anemia, asthma/chronic lung disease, CKD stage 3, diabetes, dysphagia, fibromyalgia,chronic pain, GERD, hearing loss, RA, OSA admitted with chest pain abdominal pain, with history of fall 3 to 4 weeks ago on the right side, no pain initially but woke up 3/15 with severe right-sided pain on her chest and right abdominal wall.  Has had some shortness of breath, chronic diarrhea but no fever nausea vomiting. In the ED,noted to be hypoxic 88% needing 2 L oxygen to maintain saturation in low to mid 90s, tachycardic in 100 intermittent tachypneic blood pressure stable blood work fairly stable except for leukocytosis, showed CMP with BUN 24,glucose 202, albumin 3.2.CBC with leukocytosis to 15, platelets 421.Troponin negative x2.  BNP normal.Respiratory panel flu COVID-negative,UA glucose 50 ketones 5,chest x-ray no acute finding underwent CTA chest and at abd/pelvis w/ contrast-no PE, mild bilateral scaring and or atelectasis, chronic posterior 10th and 11th left rib fractures, emphysema.large stable splenic cyst hemangioma. PCCM consulted-she underwent extensive work-up no acute finding on the lungs no base fractures.  Pain in the setting of RA versus muscular versus radiated pain . Pain at this time  has improved initially needing oxygen in the setting of pain and hyperventilation.  She was able to come off oxygen ambulating well.  Pain control with Tylenol Seen by PT OT advised skilled nursing facility and she has adamantly refused.  We are setting of home health.  She is medically stable today and being discharged home She advised to follow-up with PCP and pulmonary as outpatient in a week.  If she has recurrent pain she needs to return to the ED or call her doctor.  Discharge Diagnoses:   Right lower chest wall pain-appears musculoskeletal Acute respiratory failure with hypoxia consistent with hypoventilation in the setting of pain. Asthma/emphysema-PFTs 2018 with fixed obstruction DLCO moderately reduced  OSA Chest wall pain is resolved likely musculoskeletal.  She is advised to continue incentive spirometry at home-as she is not much mobile and mostly on the wheelchair. CTA negative for PE, troponin negative x2 BNP normal.  Recent history of fall but no fractures on the x-rays.  History of chronic neck pain. Her hypoxia is mostly from hypoventilation and it has also resolved.  Continue nebulizer at home [facility] will set up DME nebulizer.  Continue her chronic steroid.  She will follow up with pulmonary as outpatient as well.    Cervical DJD/Neck pain- chronic.  Pain is controlled. RA with significant upper and lower extremity deformities: Continue her prednisone. OSA does not tolerate CPAP.  Outpatient follow-up. Leukocytosis mild likely from steroid-at times takes 20 mg when in increased pain which she did on Saturday or Sunday.  No obvious infection in urine or CT chest abdomen.  It resolved Recent Labs  Lab 05/22/20 1608 05/23/20 0450 05/24/20 0333  WBC 15.2* 14.3* 9.6   Fibromyalgia/chronic pain: Continue her Cymbalta Neurontin.  Continue pain management as above  GERD continue PPI  Type 2 diabetes on long-term insulin: Blood sugar is well controlled she will resume her home  insulin 70-30 units Recent Labs  Lab 05/25/20 0715 05/25/20 1116 05/25/20 1657 05/25/20 2146 05/26/20 0702  GLUCAP 135* 203* 180* 183* 143*   CKD stage IIIa-renal function stable.  Up with PCP r Recent Labs  Lab 05/22/20 1608 05/23/20 0450 05/24/20 0333 05/25/20 0317 05/26/20 0338  BUN 24* 22 18 22 20   CREATININE 1.01* 0.94 0.92 1.04* 1.00   Overweight with BMI 28:Will benefit with weight loss PCP follow-up.  Deconditioning generalized weakness with multiple comorbidities: PT OT/static skilled nursing facility but patient appears to be not interested.  After much discussion she is going to think about it.  Again she has adamantly declined to go to skilled nursing facility.  At this time she seems to be much better and safe for discharge home. Offered to call her son but she does not want me to call her family to update as she reports she has been talking to them  Consults: Pulmonary Subjective: Alert awake oriented, pain is improved.  Able to take deep breath.  Not needing oxygen anymore.  Discharge Exam: Vitals:   05/26/20 0349 05/26/20 0738  BP: (!) 130/59   Pulse: 89   Resp: 18   Temp: 98.2 F (36.8 C)   SpO2: 94% 93%   General: Pt is alert, awake, not in acute distress Cardiovascular: RRR, S1/S2 +, no rubs, no gallops Respiratory: CTA bilaterally, no wheezing, no rhonchi Abdominal: Soft, NT, ND, bowel sounds + Extremities: no edema, no cyanosis  Discharge Instructions  Discharge Instructions     Discharge instructions   Complete by: As directed    Follow up with pulmonary doctor Dr Silas Flood.  Please call call MD or return to ER for similar or worsening recurring problem that brought you to hospital or if any fever,nausea/vomiting,abdominal pain, uncontrolled pain, chest pain,  shortness of breath or any other alarming symptoms.  Please follow-up your doctor as instructed in a week time and call the office for appointment.  Please avoid alcohol, smoking,  or any other illicit substance and maintain healthy habits including taking your regular medications as prescribed.  You were cared for by a hospitalist during your hospital stay. If you have any questions about your discharge medications or the care you received while you were in the hospital after you are discharged, you can call the unit and ask to speak with the hospitalist on call if the hospitalist that took care of you is not available.  Once you are discharged, your primary care physician will handle any further medical issues. Please note that NO REFILLS for any discharge medications will be authorized once you are discharged, as it is imperative that you return to your primary care physician (or establish a relationship with a primary care physician if you do not have one) for your aftercare needs so that they can reassess your need for medications and monitor your lab values   Increase activity slowly   Complete by: As directed       Allergies as of 05/26/2020       Reactions   Penicillins Hives   Has patient had a PCN reaction causing immediate rash, facial/tongue/throat swelling, SOB or lightheadedness with hypotension: Yes Has patient had a PCN reaction causing  severe rash involving mucus membranes or skin necrosis: No Has patient had a PCN reaction that required hospitalization: No Has patient had a PCN reaction occurring within the last 10 years: No If all of the above answers are "NO", then may proceed with Cephalosporin use. Patient has tolerated ceftriaxone in the recent past.   Sulfamethoxazole-trimethoprim Nausea And Vomiting   REACTION: stomach problems   Meperidine Other (See Comments)   Sulfa Antibiotics Nausea And Vomiting   REACTION: stomach problems   Sulfonamide Derivatives Nausea And Vomiting   REACTION: stomach problems        Medication List     STOP taking these medications    Qvar RediHaler 40 MCG/ACT inhaler Generic drug: beclomethasone        TAKE these medications    albuterol 108 (90 Base) MCG/ACT inhaler Commonly known as: VENTOLIN HFA   arformoterol 15 MCG/2ML Nebu Commonly known as: BROVANA Take 2 mLs (15 mcg total) by nebulization 2 (two) times daily.   atorvastatin 20 MG tablet Commonly known as: LIPITOR Take 20 mg by mouth daily.   B-12 2500 MCG Tabs Take 2,500 mcg by mouth at bedtime.   B-D UF III MINI PEN NEEDLES 31G X 5 MM Misc Generic drug: Insulin Pen Needle SMARTSIG:1 Each SUB-Q 4 Times Daily   budesonide 0.25 MG/2ML nebulizer solution Commonly known as: PULMICORT Take 2 mLs (0.25 mg total) by nebulization 2 (two) times daily.   cholecalciferol 1000 units tablet Commonly known as: VITAMIN D Take 1,000 Units by mouth at bedtime.   clotrimazole-betamethasone cream Commonly known as: LOTRISONE Apply topically.   Compressor/Nebulizer [employer] for asthma/emphysema   DULoxetine 60 MG capsule Commonly known as: CYMBALTA Take 60 mg by mouth at bedtime.   ezetimibe 10 MG tablet Commonly known as: ZETIA Take 10 mg by mouth at bedtime.   ferrous sulfate 325 (65 FE) MG tablet Take 325 mg by mouth at bedtime.   Fish Oil 1200 MG Caps Take 1,200 mg by mouth at bedtime.   gabapentin 100 MG capsule Commonly known as: NEURONTIN Take 100 mg by mouth 3 (three) times daily.   insulin NPH-regular Human (70-30) 100 UNIT/ML injection Inject 14 Units into the skin 2 (two) times daily with a meal.   multivitamin with minerals Tabs tablet Take 1 tablet by mouth at bedtime.   Myrbetriq 50 MG Tb24 tablet Generic drug: mirabegron ER Take 50 mg by mouth daily.   OneTouch Verio test strip Generic drug: glucose blood   pantoprazole 40 MG tablet Commonly known as: Protonix Take one tablet once every morning   predniSONE 5 MG tablet Commonly known as: DELTASONE Take 5 mg by mouth daily with breakfast.        Follow-up Information     [name], MD Follow up in 1  week(s).   Specialty: Internal Medicine Contact information: [place] [address] [phone]                Allergies  Allergen Reactions   Penicillins Hives    Has patient had a PCN reaction causing immediate rash, facial/tongue/throat swelling, SOB or lightheadedness with hypotension: Yes Has patient had a PCN reaction causing severe rash involving mucus membranes or skin necrosis: No Has patient had a PCN reaction that required hospitalization: No Has patient had a PCN reaction occurring within the last 10 years: No If all of the above answers are "NO", then may proceed with Cephalosporin use. Patient has tolerated ceftriaxone in the recent  past.   Sulfamethoxazole-Trimethoprim Nausea And Vomiting    REACTION: stomach problems   Meperidine Other (See Comments)   Sulfa Antibiotics Nausea And Vomiting    REACTION: stomach problems   Sulfonamide Derivatives Nausea And Vomiting    REACTION: stomach problems    The results of significant diagnostics from this hospitalization (including imaging, microbiology, ancillary and laboratory) are listed below for reference.    Microbiology: Recent Results (from the past 240 hour(s))  Resp Panel by RT-PCR (Flu A&B, Covid) Nasopharyngeal Swab     Status: None   Collection Time: 05/23/20 12:30 AM   Specimen: Nasopharyngeal Swab; Nasopharyngeal(NP) swabs in vial transport medium  Result Value Ref Range Status   SARS Coronavirus 2 by RT PCR NEGATIVE NEGATIVE Final    Comment: (NOTE) SARS-CoV-2 target nucleic acids are NOT DETECTED.  The SARS-CoV-2 RNA is generally detectable in upper respiratory specimens during the acute phase of infection. The lowest concentration of SARS-CoV-2 viral copies this assay can detect is 138 copies/mL. A negative result does not preclude SARS-Cov-2 infection and should not be used as the sole basis for treatment or other patient management decisions. A negative result may occur with   improper specimen collection/handling, submission of specimen other than nasopharyngeal swab, presence of viral mutation(s) within the areas targeted by this assay, and inadequate number of viral copies(<138 copies/mL). A negative result must be combined with clinical observations, patient history, and epidemiological information. The expected result is Negative.  Fact Sheet for Patients:  EntrepreneurPulse.com.au  Fact Sheet for Healthcare Providers:  IncredibleEmployment.be  This test is no t yet approved or cleared by the [place] FDA and  has been authorized for detection and/or diagnosis of SARS-CoV-2 by FDA under an Emergency Use Authorization (EUA). This EUA will remain  in effect (meaning this test can be used) for the duration of the COVID-19 declaration under Section 564(b)(1) of the Act, 21 U.S.C.section 360bbb-3(b)(1), unless the authorization is terminated  or revoked sooner.       Influenza A by PCR NEGATIVE NEGATIVE Final   Influenza B by PCR NEGATIVE NEGATIVE Final    Comment: (NOTE) The Xpert Xpress SARS-CoV-2/FLU/RSV plus assay is intended as an aid in the diagnosis of influenza from Nasopharyngeal swab specimens and should not be used as a sole basis for treatment. Nasal washings and aspirates are unacceptable for Xpert Xpress SARS-CoV-2/FLU/RSV testing.  Fact Sheet for Patients: EntrepreneurPulse.com.au  Fact Sheet for Healthcare Providers: IncredibleEmployment.be  This test is not yet approved or cleared by the [place] FDA and has been authorized for detection and/or diagnosis of SARS-CoV-2 by FDA under an Emergency Use Authorization (EUA). This EUA will remain in effect (meaning this test can be used) for the duration of the COVID-19 declaration under Section 564(b)(1) of the Act, 21 U.S.C. section 360bbb-3(b)(1), unless the authorization is terminated  or revoked.  Performed at [facility], [place] [address], [place], [place] [address]     Procedures/Studies: DG Chest 2 View  Result Date: 05/22/2020 CLINICAL DATA:  Chest pain. EXAM: CHEST - 2 VIEW COMPARISON:  January 17, 2018. FINDINGS: The heart size and mediastinal contours are within normal limits. Both lungs are clear. No pneumothorax or pleural effusion is noted. The visualized skeletal structures are unremarkable. IMPRESSION: No active cardiopulmonary disease. Electronically Signed   By: Marijo Conception M.D.   On: 05/22/2020 14:18   CT Angio Chest PE W and/or Wo Contrast  Result Date: 05/22/2020 CLINICAL DATA:  Status post fall 2  weeks ago with right-sided chest pain. EXAM: CT ANGIOGRAPHY CHEST WITH CONTRAST TECHNIQUE: Multidetector CT imaging of the chest was performed using the standard protocol during bolus administration of intravenous contrast. Multiplanar CT image reconstructions and MIPs were obtained to evaluate the vascular anatomy. CONTRAST:  115mL OMNIPAQUE IOHEXOL 350 MG/ML SOLN COMPARISON:  January 09, 2017 FINDINGS: Cardiovascular: There is marked severity calcification of the aortic arch without evidence of aneurysmal dilatation or dissection. The subsegmental pulmonary arteries are limited in evaluation secondary to patient motion and areas of overlying artifact. No evidence of pulmonary embolism. Normal heart size with moderate severity coronary artery calcification. No pericardial effusion. Mediastinum/Nodes: No enlarged mediastinal, hilar, or axillary lymph nodes. Thyroid gland, trachea, and esophagus demonstrate no significant findings. Lungs/Pleura: Mild emphysematous lung disease is noted within the bilateral apices. Mild areas of scarring and/or atelectasis are seen along the posterior aspect of the right lung and left lung base. This is most prominent within the right lower lobe. There is no evidence of a pleural effusion or pneumothorax. Upper Abdomen:  A stable 5.1 cm x 4.0 cm well-defined area of parenchymal low attenuation is seen within the spleen. Musculoskeletal: Chronic posterior tenth and eleventh left rib fractures are seen. Multilevel degenerative changes seen throughout the thoracic spine. Other: The study is limited secondary to patient motion. Review of the MIP images confirms the above findings. IMPRESSION: 1. No evidence of pulmonary embolism. 2. Mild bilateral areas of scarring and/or atelectasis, as described above. 3. Chronic posterior tenth and eleventh left rib fractures. 4. Large, stable splenic cyst or hemangioma. 5. Emphysema and aortic atherosclerosis. Aortic Atherosclerosis (ICD10-I70.0) and Emphysema (ICD10-J43.9). Electronically Signed   By: Virgina Norfolk M.D.   On: 05/22/2020 19:16   CT ABDOMEN PELVIS W CONTRAST  Result Date: 05/22/2020 CLINICAL DATA:  Right lower quadrant abdominal pain, fell 2 weeks ago, right chest wall bruising EXAM: CT ABDOMEN AND PELVIS WITH CONTRAST TECHNIQUE: Multidetector CT imaging of the abdomen and pelvis was performed using the standard protocol following bolus administration of intravenous contrast. CONTRAST:  153mL OMNIPAQUE IOHEXOL 350 MG/ML SOLN COMPARISON:  01/18/2018 FINDINGS: Lower chest: Scattered areas of scarring and atelectasis at the lung bases. No acute pleural or parenchymal lung disease. Hepatobiliary: No focal liver abnormality is seen. No gallstones, gallbladder wall thickening, or biliary dilatation. Pancreas: Unremarkable. No pancreatic ductal dilatation or surrounding inflammatory changes. Spleen: Stable 5.2 cm splenic cyst.  Spleen is normal in size. Adrenals/Urinary Tract: Subcentimeter renal cortical hypodensities are stable, most compatible with cysts. No urinary tract calculi or obstructive uropathy. The bladder is decompressed, with no gross abnormalities. The adrenals are normal. Stomach/Bowel: No bowel obstruction or ileus. Scattered diverticulosis of the descending and  sigmoid colon without diverticulitis. Normal appendix right lower quadrant. No bowel wall thickening or inflammatory change. Vascular/Lymphatic: Aortic atherosclerosis. No enlarged abdominal or pelvic lymph nodes. Reproductive: Status post hysterectomy. No adnexal masses. Other: No free fluid or free gas.  No abdominal wall hernia. Musculoskeletal: No acute or destructive bony lesions. Stable lower lumbar spondylosis. Reconstructed images demonstrate no additional findings. IMPRESSION: 1. No acute intra-abdominal or intrapelvic process. 2. Distal colonic diverticulosis without diverticulitis. 3. Normal appendix. 4. Stable splenic and renal cysts. 5.  Aortic Atherosclerosis (ICD10-I70.0). Electronically Signed   By: Randa Ngo M.D.   On: 05/22/2020 19:10    Labs: BNP (last 3 results) Recent Labs    05/22/20 1800  BNP 62.3   Basic Metabolic Panel: Recent Labs  Lab 05/22/20 1608 05/23/20 0450 05/24/20 0333 05/25/20 7628  05/26/20 0338  NA 139 134* 138 142 141  K 4.6 4.2 4.0 4.3 4.0  CL 105 104 108 109 112*  CO2 22 20* 21* 23 23  GLUCOSE 202* 198* 137* 205* 145*  BUN 24* 22 18 22 20   CREATININE 1.01* 0.94 0.92 1.04* 1.00  CALCIUM 9.0 7.7* 8.0* 8.1* 8.4*   Liver Function Tests: Recent Labs  Lab 05/22/20 1800 05/23/20 0450  AST 18 10*  ALT 14 12  ALKPHOS 69 57  BILITOT 0.9 1.0  PROT 7.0 5.9*  ALBUMIN 3.2* 2.9*   No results for input(s): LIPASE, AMYLASE in the last 168 hours. No results for input(s): AMMONIA in the last 168 hours. CBC: Recent Labs  Lab 05/22/20 1608 05/23/20 0450 05/24/20 0333  WBC 15.2* 14.3* 9.6  HGB 13.6 10.9* 10.4*  HCT 45.0 35.6* 34.9*  MCV 86.7 85.8 86.6  PLT 421* 304 262   Cardiac Enzymes: No results for input(s): CKTOTAL, CKMB, CKMBINDEX, TROPONINI in the last 168 hours. BNP: Invalid input(s): POCBNP CBG: Recent Labs  Lab 05/25/20 0715 05/25/20 1116 05/25/20 1657 05/25/20 2146 05/26/20 0702  GLUCAP 135* 203* 180* 183* 143*    D-Dimer No results for input(s): DDIMER in the last 72 hours. Hgb A1c No results for input(s): HGBA1C in the last 72 hours. Lipid Profile No results for input(s): CHOL, HDL, LDLCALC, TRIG, CHOLHDL, LDLDIRECT in the last 72 hours. Thyroid function studies No results for input(s): TSH, T4TOTAL, T3FREE, THYROIDAB in the last 72 hours.  Invalid input(s): FREET3 Anemia work up No results for input(s): VITAMINB12, FOLATE, FERRITIN, TIBC, IRON, RETICCTPCT in the last 72 hours. Urinalysis    Component Value Date/Time   COLORURINE YELLOW 05/22/2020 2129   APPEARANCEUR CLEAR 05/22/2020 2129   LABSPEC <1.005 (L) 05/22/2020 2129   PHURINE 5.0 05/22/2020 2129   GLUCOSEU 50 (A) 05/22/2020 2129   GLUCOSEU NEGATIVE 12/24/2016 1239   HGBUR NEGATIVE 05/22/2020 2129   BILIRUBINUR NEGATIVE 05/22/2020 2129   KETONESUR 5 (A) 05/22/2020 2129   PROTEINUR NEGATIVE 05/22/2020 2129   UROBILINOGEN 0.2 12/24/2016 1239   NITRITE NEGATIVE 05/22/2020 2129   LEUKOCYTESUR NEGATIVE 05/22/2020 2129   Sepsis Labs Invalid input(s): PROCALCITONIN,  WBC,  LACTICIDVEN Microbiology Recent Results (from the past 240 hour(s))  Resp Panel by RT-PCR (Flu A&B, Covid) Nasopharyngeal Swab     Status: None   Collection Time: 05/23/20 12:30 AM   Specimen: Nasopharyngeal Swab; Nasopharyngeal(NP) swabs in vial transport medium  Result Value Ref Range Status   SARS Coronavirus 2 by RT PCR NEGATIVE NEGATIVE Final    Comment: (NOTE) SARS-CoV-2 target nucleic acids are NOT DETECTED.  The SARS-CoV-2 RNA is generally detectable in upper respiratory specimens during the acute phase of infection. The lowest concentration of SARS-CoV-2 viral copies this assay can detect is 138 copies/mL. A negative result does not preclude SARS-Cov-2 infection and should not be used as the sole basis for treatment or other patient management decisions. A negative result may occur with  improper specimen collection/handling, submission of  specimen other than nasopharyngeal swab, presence of viral mutation(s) within the areas targeted by this assay, and inadequate number of viral copies(<138 copies/mL). A negative result must be combined with clinical observations, patient history, and epidemiological information. The expected result is Negative.  Fact Sheet for Patients:  EntrepreneurPulse.com.au  Fact Sheet for Healthcare Providers:  IncredibleEmployment.be  This test is no t yet approved or cleared by the [place] FDA and  has been authorized for detection and/or diagnosis of SARS-CoV-2 by  FDA under an Emergency Use Authorization (EUA). This EUA will remain  in effect (meaning this test can be used) for the duration of the COVID-19 declaration under Section 564(b)(1) of the Act, 21 U.S.C.section 360bbb-3(b)(1), unless the authorization is terminated  or revoked sooner.       Influenza A by PCR NEGATIVE NEGATIVE Final   Influenza B by PCR NEGATIVE NEGATIVE Final    Comment: (NOTE) The Xpert Xpress SARS-CoV-2/FLU/RSV plus assay is intended as an aid in the diagnosis of influenza from Nasopharyngeal swab specimens and should not be used as a sole basis for treatment. Nasal washings and aspirates are unacceptable for Xpert Xpress SARS-CoV-2/FLU/RSV testing.  Fact Sheet for Patients: EntrepreneurPulse.com.au  Fact Sheet for Healthcare Providers: IncredibleEmployment.be  This test is not yet approved or cleared by the [place] FDA and has been authorized for detection and/or diagnosis of SARS-CoV-2 by FDA under an Emergency Use Authorization (EUA). This EUA will remain in effect (meaning this test can be used) for the duration of the COVID-19 declaration under Section 564(b)(1) of the Act, 21 U.S.C. section 360bbb-3(b)(1), unless the authorization is terminated or revoked.  Performed at [facility], [place]  [address] [place], [place] [address]      Time coordinating discharge: 25 minutes  SIGNED: [name], MD  [facility] 05/26/2020, 7:55 AM  If 7PM-7AM, please contact night-coverage [email or web address]

## 2020-05-26 NOTE — Care Management Important Message (Signed)
Important Message  Patient Details IM Letter given to the Patient. Name: [name] MRN: [id number] Date of Birth: [birth year]   Medicare Important Message Given:  Yes     Kerin Salen 05/26/2020, 9:48 AM

## 2020-05-26 NOTE — Plan of Care (Signed)
  Problem: Coping: Goal: Level of anxiety will decrease Outcome: Progressing   Problem: Pain Managment: Goal: General experience of comfort will improve Outcome: Progressing   Problem: Safety: Goal: Ability to remain free from injury will improve Outcome: Progressing   

## 2020-05-29 ENCOUNTER — Other Ambulatory Visit: Payer: Self-pay | Admitting: *Deleted

## 2020-05-29 DIAGNOSIS — J439 Emphysema, unspecified: Secondary | ICD-10-CM | POA: Diagnosis not present

## 2020-05-29 DIAGNOSIS — G8929 Other chronic pain: Secondary | ICD-10-CM | POA: Diagnosis not present

## 2020-05-29 DIAGNOSIS — J45909 Unspecified asthma, uncomplicated: Secondary | ICD-10-CM | POA: Diagnosis not present

## 2020-05-29 DIAGNOSIS — E1151 Type 2 diabetes mellitus with diabetic peripheral angiopathy without gangrene: Secondary | ICD-10-CM | POA: Diagnosis not present

## 2020-05-29 DIAGNOSIS — E1122 Type 2 diabetes mellitus with diabetic chronic kidney disease: Secondary | ICD-10-CM | POA: Diagnosis not present

## 2020-05-29 DIAGNOSIS — J849 Interstitial pulmonary disease, unspecified: Secondary | ICD-10-CM | POA: Diagnosis not present

## 2020-05-29 NOTE — Telephone Encounter (Signed)
Called and spoke with patient who confirmed that she got the dates of her PFT and OV with Dr. Silas Flood. Patient states that she lives in [place], [place] and is going to try and see where she can get covid test near her and will call the office back with information.

## 2020-05-29 NOTE — Patient Outreach (Signed)
[facility]) Care Management  05/29/2020  KENECIA BARREN December 01, 1940 [id number]   Referral received from hospital liaison as member was discharged from hospital on 3/18.  Call placed, successful however state this is not a good time to talk, request call back.  Will attempt call back later this afternoon per her request, if unable to do so, agrees to call back tomorrow afternoon.  [name], [place], MSN [place] [phone]

## 2020-05-30 ENCOUNTER — Other Ambulatory Visit: Payer: Self-pay | Admitting: *Deleted

## 2020-05-30 NOTE — Patient Outreach (Signed)
[name]  05/30/2020  [name] [birth year] [id number]   Referral Date: 3/21 Referral Source: Hospital liaison Referral Reason: Post hospital discharge Insurance: [employer]   Outreach attempt #1, unsuccessful, unable to leave voice message.  Plan: RN CM will send outreach letter and follow up within the next 3-4 business days.  [name], [place], MSN [place] [phone]

## 2020-05-31 ENCOUNTER — Other Ambulatory Visit: Payer: Self-pay

## 2020-05-31 ENCOUNTER — Emergency Department (HOSPITAL_COMMUNITY)
Admission: EM | Admit: 2020-05-31 | Discharge: 2020-06-01 | Disposition: A | Discharge status: Home / Self Care | Payer: Medicare Other | Attending: Emergency Medicine | Admitting: Emergency Medicine

## 2020-05-31 ENCOUNTER — Emergency Department (HOSPITAL_COMMUNITY): Payer: Medicare Other

## 2020-05-31 ENCOUNTER — Emergency Department (HOSPITAL_BASED_OUTPATIENT_CLINIC_OR_DEPARTMENT_OTHER)
Admit: 2020-05-31 | Discharge: 2020-05-31 | Disposition: A | Discharge status: Home / Self Care | Payer: Medicare Other | Attending: Emergency Medicine | Admitting: Emergency Medicine

## 2020-05-31 ENCOUNTER — Encounter (HOSPITAL_COMMUNITY): Payer: Self-pay | Admitting: Emergency Medicine

## 2020-05-31 DIAGNOSIS — J45909 Unspecified asthma, uncomplicated: Secondary | ICD-10-CM | POA: Diagnosis not present

## 2020-05-31 DIAGNOSIS — N183 Chronic kidney disease, stage 3 unspecified: Secondary | ICD-10-CM | POA: Insufficient documentation

## 2020-05-31 DIAGNOSIS — D72829 Elevated white blood cell count, unspecified: Secondary | ICD-10-CM | POA: Diagnosis not present

## 2020-05-31 DIAGNOSIS — R0789 Other chest pain: Secondary | ICD-10-CM | POA: Insufficient documentation

## 2020-05-31 DIAGNOSIS — Z794 Long term (current) use of insulin: Secondary | ICD-10-CM | POA: Diagnosis not present

## 2020-05-31 DIAGNOSIS — R61 Generalized hyperhidrosis: Secondary | ICD-10-CM | POA: Diagnosis not present

## 2020-05-31 DIAGNOSIS — R000 Tachycardia, unspecified: Secondary | ICD-10-CM | POA: Diagnosis not present

## 2020-05-31 DIAGNOSIS — R0902 Hypoxemia: Secondary | ICD-10-CM | POA: Diagnosis not present

## 2020-05-31 DIAGNOSIS — R609 Edema, unspecified: Secondary | ICD-10-CM | POA: Diagnosis not present

## 2020-05-31 DIAGNOSIS — E1122 Type 2 diabetes mellitus with diabetic chronic kidney disease: Secondary | ICD-10-CM | POA: Diagnosis not present

## 2020-05-31 DIAGNOSIS — R079 Chest pain, unspecified: Secondary | ICD-10-CM | POA: Diagnosis not present

## 2020-05-31 DIAGNOSIS — Z87891 Personal history of nicotine dependence: Secondary | ICD-10-CM | POA: Diagnosis not present

## 2020-05-31 DIAGNOSIS — W19XXXA Unspecified fall, initial encounter: Secondary | ICD-10-CM | POA: Diagnosis not present

## 2020-05-31 LAB — CBC WITH DIFFERENTIAL/PLATELET
Abs Immature Granulocytes: 0.24 10*3/uL — ABNORMAL HIGH (ref 0.00–0.07)
Basophils Absolute: 0.1 10*3/uL (ref 0.0–0.1)
Basophils Relative: 1 %
Eosinophils Absolute: 0.4 10*3/uL (ref 0.0–0.5)
Eosinophils Relative: 3 %
HCT: 40.6 % (ref 36.0–46.0)
Hemoglobin: 12.3 g/dL (ref 12.0–15.0)
Immature Granulocytes: 2 %
Lymphocytes Relative: 16 %
Lymphs Abs: 2 10*3/uL (ref 0.7–4.0)
MCH: 25.7 pg — ABNORMAL LOW (ref 26.0–34.0)
MCHC: 30.3 g/dL (ref 30.0–36.0)
MCV: 84.9 fL (ref 80.0–100.0)
Monocytes Absolute: 0.7 10*3/uL (ref 0.1–1.0)
Monocytes Relative: 5 %
Neutro Abs: 9.3 10*3/uL — ABNORMAL HIGH (ref 1.7–7.7)
Neutrophils Relative %: 73 %
Platelets: 439 10*3/uL — ABNORMAL HIGH (ref 150–400)
RBC: 4.78 MIL/uL (ref 3.87–5.11)
RDW: 16.2 % — ABNORMAL HIGH (ref 11.5–15.5)
WBC: 12.7 10*3/uL — ABNORMAL HIGH (ref 4.0–10.5)
nRBC: 0 % (ref 0.0–0.2)

## 2020-05-31 LAB — COMPREHENSIVE METABOLIC PANEL
ALT: 12 U/L (ref 0–44)
AST: 15 U/L (ref 15–41)
Albumin: 3 g/dL — ABNORMAL LOW (ref 3.5–5.0)
Alkaline Phosphatase: 69 U/L (ref 38–126)
Anion gap: 10 (ref 5–15)
BUN: 21 mg/dL (ref 8–23)
CO2: 22 mmol/L (ref 22–32)
Calcium: 8.5 mg/dL — ABNORMAL LOW (ref 8.9–10.3)
Chloride: 104 mmol/L (ref 98–111)
Creatinine, Ser: 0.93 mg/dL (ref 0.44–1.00)
GFR, Estimated: >60 mL/min (ref >60)
Glucose, Bld: 113 mg/dL — ABNORMAL HIGH (ref 70–99)
Potassium: 3.9 mmol/L (ref 3.5–5.1)
Sodium: 136 mmol/L (ref 135–145)
Total Bilirubin: 0.5 mg/dL (ref 0.3–1.2)
Total Protein: 6.2 g/dL — ABNORMAL LOW (ref 6.5–8.1)

## 2020-05-31 LAB — LIPASE, BLOOD: Lipase: 32 U/L (ref 11–51)

## 2020-05-31 LAB — TROPONIN I (HIGH SENSITIVITY): Troponin I (High Sensitivity): 6 ng/L (ref <18)

## 2020-05-31 MED ORDER — GABAPENTIN 100 MG PO CAPS
200.0000 mg | ORAL_CAPSULE | Freq: Once | ORAL | Status: AC
  Administered 2020-05-31: 200 mg via ORAL
  Filled 2020-05-31: qty 2

## 2020-05-31 MED ORDER — OXYCODONE-ACETAMINOPHEN 5-325 MG PO TABS
1.0000 | ORAL_TABLET | Freq: Once | ORAL | Status: DC
Start: 2020-05-31 — End: 2020-06-01
  Filled 2020-05-31: qty 1

## 2020-05-31 MED ORDER — FENTANYL CITRATE (PF) 100 MCG/2ML IJ SOLN
50.0000 ug | Freq: Once | INTRAMUSCULAR | Status: AC
  Administered 2020-05-31: 50 ug via INTRAVENOUS
  Filled 2020-05-31: qty 2

## 2020-05-31 MED ORDER — IBUPROFEN 800 MG PO TABS
800.0000 mg | ORAL_TABLET | Freq: Once | ORAL | Status: AC
  Administered 2020-05-31: 800 mg via ORAL
  Filled 2020-05-31: qty 1

## 2020-05-31 NOTE — ED Triage Notes (Signed)
The patient presents from home with complaints of chest pain which originates under the breast area and radiates to her back. She was seen on the 14 th for this same issue.She describes the pain as sharp and has been constant for 1.5 weeks. To help with pain relief she has taken 4mg  of oxycodone today and 2mg  of gabapentin. Pain increases with movement. EMS EKG revealed sinus tach at 105 and possible left bundle branch block.  HX COPD HN  EMS Vitals: 132/78 BP 94 HR 92% SPO2 on room air 16 RR 113 CBG

## 2020-05-31 NOTE — Discharge Instructions (Addendum)
Take anti-inflammatories as prescribed. May also use Lidoderm patches.  Please to your right lower ribs.  Keep the patch on for 12 hours and remove for 12 hours.  Follow-up with primary care provider this week  Return for new worsening symptoms

## 2020-05-31 NOTE — ED Provider Notes (Signed)
[facility] DEPT Provider Note   CSN: [id number] Arrival date & time: 05/31/20  1537     History CP   [name] is a 80 y.o. female with past medical history significant for Asthma, OSA, DM presents for evaluation of CP. Seen here previous with admission for similar. Had hypoxia at that time.  Hypoxia resolved prior to DC, baseline 02 at 90-91%.  She had a CTA, CT abdomen pelvis as her pain occurred after a fall.  Reportedly  needed a SNF at dc however patient declined.  States she is wheelchair base at baseline.  She lives on her own.  She is only able to stand and pivot.  Has bed side commode at home. States right-sided chest pain that radiates under her right shoulder blade. She has no abdominal pain.  Pain worsens when she takes a deep breath, moves.  States pain has not worsening. Just continuing. Taking Gapapentin and Norco at home. Took #4 norco at home today. States helped with pain. Seen by Pulm on prior admission. Thought pain likely due to MSK etiology. Pain does not radiate into left arm, jaw, neck or left arm. No associated diaphoresis, nausea, vomiting, paresthesias or weakness.  She denies any abdominal pain.  She is tolerating p.o. intake at home without difficulty.  Has weakness at baseline.  States has not increased. Rates current pain a 7/10.  She denies any other complaints however when I palpate her bilateral lower extremity she states she has pain.  Has not noted any redness, swelling, warmth.  She denies any fever, chills, nausea, vomiting, shortness of breath, cough, diarrhea, dysuria, paresthesias, weakness.  Denies additional aggravating or alleviating factors.  She denies any new falls or injuries since discharge  Has not followed with outpatient provider since dc home.   Per previous dc note>>>>  "Right lower chest wall pain-appears musculoskeletal Acute respiratory failure with hypoxia consistent with hypoventilation in the setting of  pain. Asthma/emphysema-PFTs 2018 with fixed obstruction DLCO moderately reduced  OSA Chest wall pain is resolved likely musculoskeletal.  She is advised to continue incentive spirometry at home-as she is not much mobile and mostly on the wheelchair. CTA negative for PE, troponin negative x2 BNP normal.  Recent history of fall but no fractures on the x-rays.  History of chronic neck pain. Her hypoxia is mostly from hypoventilation and it has also resolved.  Continue nebulizer at home [facility] will set up DME nebulizer.  Continue her chronic steroid.  She will follow up with pulmonary as outpatient as well.    Cervical DJD/Neck pain- chronic.  Pain is controlled. RA with significant upper and lower extremity deformities: Continue her prednisone. OSA does not tolerate CPAP.  Outpatient follow-up. Leukocytosis mild likely from steroid-at times takes 20 mg when in increased pain which she did on Saturday or Sunday.  No obvious infection in urine or CT chest abdomen.  It resolved"   History obtained from patient and past medical history. No interpretor was used.  Patient does NOT want family to be contacted at this time.    HPI     Past Medical History:  Diagnosis Date  . Asthma   . Complication of anesthesia    Anesthesia told her years ago she got too cold during surgery  . Diabetes mellitus without complication ([place])   . Emphysema   . Fibromyalgia   . Osteoporosis   . Rheumatoid arthritis(714.0)   . Sleep apnea    no cpap  Patient Active Problem List   Diagnosis Date Noted  . Hypoxia 05/23/2020  . Acute respiratory failure with hypoxia ([place]) 05/22/2020  . Rotator cuff arthropathy of left shoulder 12/13/2019  . Pain in joint of right shoulder 12/10/2019  . Pain in joint of left shoulder 12/10/2019  . Hearing loss, sensorineural, 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  . Tinnitus, left 01/08/2019  . DM type 2, not at goal [facility]) 06/03/2018  . Frequent infections 05/21/2018  . Gangrene ([place])  05/21/2018  . Pain in finger 05/07/2018  . Family history of aneurysm 05/07/2018  . Diabetes mellitus due to underlying condition, controlled, with complication, without long-term current use of insulin ([place])   . Lower urinary tract infectious disease   . Palpitations 01/17/2018  . Dehydration 01/17/2018  . Hypotension 01/17/2018  . Symptom of blood in stool 01/17/2018  . Diarrhea in adult patient 01/17/2018  . 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  . Aftercare following joint replacement 08/21/2017  . 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 Closed displaced transverse fracture of right patella 05/06/2017  . Anemia 04/16/2017  . Chronic interstitial lung disease ([place]) 04/16/2017  . Chronic kidney disease (CKD), stage III (moderate) ([place]) 04/16/2017  . Other hyperlipidemia 04/16/2017  . Type 2 diabetes mellitus without complication, without long-term current use of insulin ([place]) 04/16/2017  . Pain in right knee 03/25/2017  . Aftercare 03/25/2017  . Arthritis, septic, knee ([place]) 03/09/2017  . 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 Fibromyalgia 06/04/2016  . High risk medication use 06/04/2016  . Claw toe, acquired, left 01/29/2016  . Rheumatoid arthritis involving both feet with negative rheumatoid factor ([place]) 01/29/2016  . Septic prepatellar bursitis of right knee 10/26/2014  . Prepatellar bursitis of right knee 08/28/2014  . DYSPHAGIA ORAL PHASE 06/18/2007  . Asthma 05/14/2007  . SLEEP APNEA 05/14/2007  . Sleep apnea 05/14/2007  . GERD 05/13/2007  . Rheumatoid arthritis ([place]) 05/13/2007  . OSTEOPOROSIS 05/13/2007  . DIARRHEA, CHRONIC 05/13/2007    Past Surgical History:  Procedure Laterality Date  . ABDOMINAL HYSTERECTOMY    . ANKLE FRACTURE SURGERY  2007  . BREAST SURGERY     mammoplasty and then removed  . FOOT SURGERY     numerous  . I & D  EXTREMITY Right 10/26/2014   Procedure: IRRIGATION AND DEBRIDEMENT RIGHT KNEE ;  Surgeon: Gaynelle Arabian, MD;  Location: [facility];  Service: Orthopedics;  Laterality: Right;  . IRRIGATION AND DEBRIDEMENT KNEE Right 03/09/2017   Procedure: IRRIGATION AND DEBRIDEMENT RIGHT KNEE PRE-PATELLAR BURSA;  Surgeon: Susa Day, MD;  Location: [facility];  Service: Orthopedics;  Laterality: Right;  . KNEE BURSECTOMY Right 09/16/2014   Procedure: EXCISON OF PRE PATELLA BURSA RIGHT KNEE ;  Surgeon: Gaynelle Arabian, MD;  Location: [facility];  Service: Orthopedics;  Laterality: Right;  . PARTIAL HYSTERECTOMY  1970  . TOTAL KNEE ARTHROPLASTY  2005 / 2006   x2     OB History   No obstetric history on file.     Family History  Problem Relation Age of Onset  . Hypertension Other   . Diabetes Other   . Stroke Other   . Diabetes Mother   . Diabetes Father   . Diabetes Sister   . Diabetes Maternal Aunt   . Cancer Paternal Uncle   . Cancer Paternal Grandmother   . Breast cancer Paternal Grandmother     Social History   Tobacco Use  .  Smoking status: Former Smoker    Quit date: 03/11/1994    Years since quitting: 26.2  . Smokeless tobacco: Never Used  Vaping Use  . Vaping Use: Never used  Substance Use Topics  . Alcohol use: No  . Drug use: No    Home Medications Prior to Admission medications   Medication Sig Start Date End Date Taking? Authorizing Provider  albuterol (VENTOLIN HFA) 108 (90 Base) MCG/ACT inhaler  07/05/19   [provider]  arformoterol (BROVANA) 15 MCG/2ML NEBU Take 2 mLs (15 mcg total) by nebulization 2 (two) times daily. 05/26/20 06/25/20  [name], MD  atorvastatin (LIPITOR) 20 MG tablet Take 20 mg by mouth daily. 11/29/19   [provider]  B-D UF III MINI PEN NEEDLES 31G X 5 MM MISC SMARTSIG:1 Each SUB-Q 4 Times Daily 10/22/19   [provider]  budesonide (PULMICORT) 0.25 MG/2ML nebulizer solution Take 2 mLs (0.25 mg total) by nebulization 2 (two) times  daily. 05/26/20 06/25/20  [name], MD  cholecalciferol (VITAMIN D) 1000 UNITS tablet Take 1,000 Units by mouth at bedtime.     [provider]  clotrimazole-betamethasone (LOTRISONE) cream Apply topically. 10/07/19   [provider]  Cyanocobalamin (B-12) 2500 MCG TABS Take 2,500 mcg by mouth at bedtime.     [provider]  DULoxetine (CYMBALTA) 60 MG capsule Take 60 mg by mouth at bedtime.     [provider]  ezetimibe (ZETIA) 10 MG tablet Take 10 mg by mouth at bedtime.  11/04/16   [provider]  ferrous sulfate 325 (65 FE) MG tablet Take 325 mg by mouth at bedtime.     [provider]  gabapentin (NEURONTIN) 100 MG capsule Take 100 mg by mouth 3 (three) times daily. 11/17/19   [provider]  insulin NPH-regular Human (70-30) 100 UNIT/ML injection Inject 14 Units into the skin 2 (two) times daily with a meal.    [provider]  Multiple Vitamin (MULTIVITAMIN WITH MINERALS) TABS tablet Take 1 tablet by mouth at bedtime.    [provider]  MYRBETRIQ 50 MG TB24 tablet Take 50 mg by mouth daily. 03/29/20   [provider]  Nebulizers (COMPRESSOR/NEBULIZER) MISC Nebulizer machine/mask/connector for asthma/emphysema 05/26/20   [name], MD  Omega-3 Fatty Acids (FISH OIL) 1200 MG CAPS Take 1,200 mg by mouth at bedtime.     [provider]  [facility] VERIO test strip  10/13/18   [provider]  pantoprazole (PROTONIX) 40 MG tablet Take one tablet once every morning 05/26/20   [name], MD  predniSONE (DELTASONE) 5 MG tablet Take 5 mg by mouth daily with breakfast.  Patient not taking: Reported on 05/22/2020    [provider]    Allergies    Penicillins, Sulfamethoxazole-trimethoprim, Meperidine, Sulfa antibiotics, and Sulfonamide derivatives  Review of Systems   Review of Systems  Constitutional: Negative.   HENT: Negative.   Respiratory: Negative.   Cardiovascular: Positive  for chest pain (Since prior admission). Negative for palpitations and leg swelling.  Gastrointestinal: Negative.   Genitourinary: Negative.   Musculoskeletal: Negative.   Skin: Negative.   Neurological: Positive for weakness (Chronic, at baseline per patient).  All other systems reviewed and are negative.   Physical Exam Updated Vital Signs BP 124/67   Pulse 95   Temp 98.8 F (37.1 C) (Oral)   Resp (!) 23   Ht 5\' 6"  (1.676 m)   Wt 85.9 kg   SpO2 90%  BMI 30.57 kg/m   Physical Exam Vitals and nursing note reviewed.  Constitutional:      General: She is not in acute distress.    Appearance: She is well-developed. She is ill-appearing (Chronically ill appearing). She is not toxic-appearing.  HENT:     Head: Normocephalic and atraumatic.  Eyes:     Pupils: Pupils are equal, round, and reactive to light.  Cardiovascular:     Rate and Rhythm: Normal rate.     Pulses: Normal pulses.          Radial pulses are 2+ on the right side and 2+ on the left side.       Dorsalis pedis pulses are 2+ on the right side and 2+ on the left side.     Heart sounds: Normal heart sounds.  Pulmonary:     Effort: Pulmonary effort is normal. No tachypnea or respiratory distress.     Breath sounds: Normal breath sounds and air entry.     Comments: Clear to auscultation bilaterally.  Speaks in full sentences without difficulty. Chest:       Comments: Equal rise and fall to chest wall.  Diffuse tenderness to right anterior, lateral rib.  Mild tenderness to right breast however no edema, erythema, warmth.  No nipple changes. Abdominal:     General: Bowel sounds are normal. There is no distension.     Palpations: Abdomen is soft.     Tenderness: There is no abdominal tenderness. There is no right CVA tenderness, left CVA tenderness or guarding. Negative signs include Murphy's sign and McBurney's sign.     Hernia: No hernia is present.     Comments: Soft, nontender without rebound or guarding.   Negative Murphy sign.  Musculoskeletal:        General: Normal range of motion.     Cervical back: Normal range of motion.     Comments: No midline spinal tenderness, step-off.  No bony tenderness of bilateral upper and lower extremities.  She has contracted hands bilaterally.  Pelvis stable, nontender palpation.  Moves all 4 extremities.  Diffuse tenderness bilateral lower extremities have no edema, erythema or warmth.  Lateral deviation at ankles to bilateral lower extremities.  Wiggles toes bilaterally.  Skin:    General: Skin is warm and dry.     Capillary Refill: Capillary refill takes less than 2 seconds.     Comments: No edema, erythema or warmth.  No fluctuance or induration  Neurological:     General: No focal deficit present.     Mental Status: She is alert.     Cranial Nerves: Cranial nerves are intact.     Sensory: Sensation is intact.     Comments: Nonambulatory>> at baseline Cranial nerves II through grossly intact Intact sensation Generalized weakness     ED Results / Procedures / Treatments   Labs (all labs ordered are listed, but only abnormal results are displayed) Labs Reviewed  CBC WITH DIFFERENTIAL/PLATELET - Abnormal; Notable for the following components:      Result Value   WBC 12.7 (*)    MCH 25.7 (*)    RDW 16.2 (*)    Platelets 439 (*)    Neutro Abs 9.3 (*)    Abs Immature Granulocytes 0.24 (*)    All other components within normal limits  COMPREHENSIVE METABOLIC PANEL - Abnormal; Notable for the following components:   Glucose, Bld 113 (*)    Calcium 8.5 (*)    Total Protein 6.2 (*)  Albumin 3.0 (*)    All other components within normal limits  LIPASE, BLOOD  TROPONIN I (HIGH SENSITIVITY)    EKG EKG Interpretation  Date/Time:  Wednesday May 31 2020 16:18:21 EDT Ventricular Rate:  95 PR Interval:    QRS Duration: 113 QT Interval:  378 QTC Calculation: 476 R Axis:   -67 Text Interpretation: Sinus rhythm Left anterior fascicular block  Abnormal R-wave progression, early transition Probable left ventricular hypertrophy No significant change since last tracing Confirmed by Dorie Rank [phone]) on 05/31/2020 4:20:23 PM   Radiology DG Chest 2 View  Result Date: 05/31/2020 CLINICAL DATA:  Chest pain EXAM: CHEST - 2 VIEW COMPARISON:  05/22/2020 FINDINGS: Left basilar scarring. Right lung clear. Heart is normal size. No effusions or acute bony abnormality. IMPRESSION: No active cardiopulmonary disease. Electronically Signed   By: Rolm Baptise M.D.   On: 05/31/2020 17:15   VAS [place] LOWER EXTREMITY VENOUS (DVT) (ONLY [facility] & [facility])  Result Date: 05/31/2020  Lower Venous DVT Study Indications: Edema.  Risk Factors: None identified. Comparison Study: No prior studies. Performing Technologist: Oliver Hum RVT  Examination Guidelines: A complete evaluation includes B-mode imaging, spectral Doppler, color Doppler, and power Doppler as needed of all accessible portions of each vessel. Bilateral testing is considered an integral part of a complete examination. Limited examinations for reoccurring indications may be performed as noted. The reflux portion of the exam is performed with the patient in reverse Trendelenburg.  +---------+---------------+---------+-----------+----------+--------------+ RIGHT    CompressibilityPhasicitySpontaneityPropertiesThrombus Aging +---------+---------------+---------+-----------+----------+--------------+ CFV      Full           Yes      Yes                                 +---------+---------------+---------+-----------+----------+--------------+ SFJ      Full                                                        +---------+---------------+---------+-----------+----------+--------------+ FV Prox  Full                                                        +---------+---------------+---------+-----------+----------+--------------+ FV Mid   Full                                                         +---------+---------------+---------+-----------+----------+--------------+ FV DistalFull                                                        +---------+---------------+---------+-----------+----------+--------------+ PFV      Full                                                        +---------+---------------+---------+-----------+----------+--------------+  POP      Full           Yes      Yes                                 +---------+---------------+---------+-----------+----------+--------------+ PTV      Full                                                        +---------+---------------+---------+-----------+----------+--------------+ PERO     Full                                                        +---------+---------------+---------+-----------+----------+--------------+   +---------+---------------+---------+-----------+----------+--------------+ LEFT     CompressibilityPhasicitySpontaneityPropertiesThrombus Aging +---------+---------------+---------+-----------+----------+--------------+ CFV      Full           Yes      Yes                                 +---------+---------------+---------+-----------+----------+--------------+ SFJ      Full                                                        +---------+---------------+---------+-----------+----------+--------------+ FV Prox  Full                                                        +---------+---------------+---------+-----------+----------+--------------+ FV Mid   Full                                                        +---------+---------------+---------+-----------+----------+--------------+ FV DistalFull                                                        +---------+---------------+---------+-----------+----------+--------------+ PFV      Full                                                         +---------+---------------+---------+-----------+----------+--------------+ POP      Full           Yes      Yes                                 +---------+---------------+---------+-----------+----------+--------------+  PTV      Full                                                        +---------+---------------+---------+-----------+----------+--------------+ PERO     Full                                                        +---------+---------------+---------+-----------+----------+--------------+     Summary: RIGHT: - There is no evidence of deep vein thrombosis in the lower extremity.  - No cystic structure found in the popliteal fossa.  LEFT: - There is no evidence of deep vein thrombosis in the lower extremity.  - No cystic structure found in the popliteal fossa.  *See table(s) above for measurements and observations.    Preliminary     Procedures Procedures   Medications Ordered in ED Medications  fentaNYL (SUBLIMAZE) injection 50 mcg (50 mcg Intravenous Given 05/31/20 1803)   ED Course  I have reviewed the triage vital signs and the nursing notes.  Pertinent labs & imaging results that were available during my care of the patient were reviewed by me and considered in my medical decision making (see chart for details).  80 year old presents for evaluation of CP. Recent dc home 5 days PTA for similar complaints. Had hypoxia on admission then resolved at dc home.  She is afebrile, nonseptic, non-ill-appearing.  Symptoms have not worsened however have not improved either.  Her heart and lungs are clear.  Abdomen is soft, nontender.  I am able to reproduce her right-sided chest pain.  Does have some mild tenderness to her left breast however no overlying skin changes, palpable masses.  Low suspicion for acute infections process or malignancy at this time.  Does have some diffuse tenderness to bilateral lower legs, no edema, erythema or warmth.  She is nonverbal at baseline  and only stands to pivot as per patient which is chronic.  No left-sided chest pain rating to back, left arm or jaw.  Her symptoms are nonexertional nonpleuritic in nature.  I reviewed her prior imaging including a CTA chest as well as CT abdomen pelvis from her prior admission which not show evidence of acute abnormality.  Pulmonology had seen patient during prior admission noted her pain likely MSK in etiology.  Also noted patient's hypoxia likely due to atelectasis and generalized deconditioning.  They did recommend SNF at discharge which patient declined.  She continues to deny need for any placement today.  Given symptoms of not worsened and are similar to prior admission, reassuring vital signs low suspicion for acute PE. We will plan on labs, imaging and reassess.  Labs and imaging personally reviewed and interpreted:  EKG similar to prior, no ischemia [place] negative for DVT bilaterally CBC with leukocytosis at 12.7 DG chest without acute infiltrates, cardiomegaly, pulmonary edema, pneumothorax, rib fracture CMP at baseline Trop 8>>6  Patient reassessed.  States she feels better and like to go home.  Work-up here is reassuring as well as her extensive work-up while previously inpatient.  She has not any hypoxia.  Her oxygen is 90 to 92% on room air which is at her baseline.  Does not ambulate at baseline. At this time I have low suspicion for acute ACS, PE, dissection, pneumothorax or acute bacterial infectious process.  Plan at prior discharge PT had recommended SNF due to generalized weakness, deconditioning patient again declines this today. Will DC home with symptomatic management.  I did offer to call her family multiple times.  She has declined this.    Patient is to be discharged with recommendation to follow up with PCP in regards to today's hospital visit. Chest pain is not likely of cardiac or pulmonary etiology d/t presentation, PERC negative, VSS, no tracheal deviation, no JVD or new  murmur, RRR, breath sounds equal bilaterally, EKG without acute abnormalities, negative troponin, and negative CXR. Pt has been advised to return to the ED if CP becomes exertional, associated with diaphoresis or nausea, radiates to left jaw/arm, worsens or becomes concerning in any way. Pt appears reliable for follow up and is agreeable to discharge.   The patient has been appropriately medically screened and/or stabilized in the ED. I have low suspicion for any other emergent medical condition which would require further screening, evaluation or treatment in the ED or require inpatient management.  Patient is hemodynamically stable and in no acute distress.  Patient able to ambulate in department prior to ED.  Evaluation does not show acute pathology that would require ongoing or additional emergent interventions while in the emergency department or further inpatient treatment.  I have discussed the diagnosis with the patient and answered all questions.  Pain is been managed while in the emergency department and patient has no further complaints prior to discharge.  Patient is comfortable with plan discussed in room and is stable for discharge at this time.  I have discussed strict return precautions for returning to the emergency department.  Patient was encouraged to follow-up with PCP/specialist refer to at discharge.   Patient seen eval by attending, Dr. Tomi Bamberger who agrees above treatment, plan disposition per    MDM Rules/Calculators/A&P                           Final Clinical Impression(s) / ED Diagnoses Final diagnoses:  Atypical chest pain    Rx / DC Orders ED Discharge Orders    None       Augustus Zurawski A, PA-C 05/31/20 2018    Dorie Rank, MD 06/01/20 1507

## 2020-05-31 NOTE — ED Notes (Signed)
PTAR called  

## 2020-05-31 NOTE — Progress Notes (Signed)
Bilateral lower extremity venous duplex has been completed. Preliminary results can be found in CV Proc through chart review.  Results were given to [facility] PA.  05/31/20 5:31 PM Carlos Levering RVT

## 2020-06-01 DIAGNOSIS — R079 Chest pain, unspecified: Secondary | ICD-10-CM | POA: Diagnosis not present

## 2020-06-01 DIAGNOSIS — R0789 Other chest pain: Secondary | ICD-10-CM | POA: Diagnosis not present

## 2020-06-01 DIAGNOSIS — R0902 Hypoxemia: Secondary | ICD-10-CM | POA: Diagnosis not present

## 2020-06-01 DIAGNOSIS — Z743 Need for continuous supervision: Secondary | ICD-10-CM | POA: Diagnosis not present

## 2020-06-01 DIAGNOSIS — R279 Unspecified lack of coordination: Secondary | ICD-10-CM | POA: Diagnosis not present

## 2020-06-02 DIAGNOSIS — D649 Anemia, unspecified: Secondary | ICD-10-CM | POA: Diagnosis not present

## 2020-06-02 DIAGNOSIS — E785 Hyperlipidemia, unspecified: Secondary | ICD-10-CM | POA: Diagnosis not present

## 2020-06-02 DIAGNOSIS — J45909 Unspecified asthma, uncomplicated: Secondary | ICD-10-CM | POA: Diagnosis not present

## 2020-06-02 DIAGNOSIS — R0789 Other chest pain: Secondary | ICD-10-CM | POA: Diagnosis not present

## 2020-06-02 DIAGNOSIS — E663 Overweight: Secondary | ICD-10-CM | POA: Diagnosis not present

## 2020-06-02 DIAGNOSIS — K219 Gastro-esophageal reflux disease without esophagitis: Secondary | ICD-10-CM | POA: Diagnosis not present

## 2020-06-02 DIAGNOSIS — M797 Fibromyalgia: Secondary | ICD-10-CM | POA: Diagnosis not present

## 2020-06-02 DIAGNOSIS — J9601 Acute respiratory failure with hypoxia: Secondary | ICD-10-CM | POA: Diagnosis not present

## 2020-06-02 DIAGNOSIS — M069 Rheumatoid arthritis, unspecified: Secondary | ICD-10-CM | POA: Diagnosis not present

## 2020-06-02 DIAGNOSIS — N1831 Chronic kidney disease, stage 3a: Secondary | ICD-10-CM | POA: Diagnosis not present

## 2020-06-02 DIAGNOSIS — M47812 Spondylosis without myelopathy or radiculopathy, cervical region: Secondary | ICD-10-CM | POA: Diagnosis not present

## 2020-06-02 DIAGNOSIS — E119 Type 2 diabetes mellitus without complications: Secondary | ICD-10-CM | POA: Diagnosis not present

## 2020-06-02 DIAGNOSIS — D72828 Other elevated white blood cell count: Secondary | ICD-10-CM | POA: Diagnosis not present

## 2020-06-05 ENCOUNTER — Encounter: Payer: Self-pay | Admitting: *Deleted

## 2020-06-05 ENCOUNTER — Other Ambulatory Visit: Payer: Self-pay | Admitting: *Deleted

## 2020-06-05 DIAGNOSIS — J45909 Unspecified asthma, uncomplicated: Secondary | ICD-10-CM | POA: Diagnosis not present

## 2020-06-05 DIAGNOSIS — J439 Emphysema, unspecified: Secondary | ICD-10-CM | POA: Diagnosis not present

## 2020-06-05 DIAGNOSIS — J849 Interstitial pulmonary disease, unspecified: Secondary | ICD-10-CM | POA: Diagnosis not present

## 2020-06-05 DIAGNOSIS — J441 Chronic obstructive pulmonary disease with (acute) exacerbation: Secondary | ICD-10-CM

## 2020-06-05 DIAGNOSIS — G8929 Other chronic pain: Secondary | ICD-10-CM | POA: Diagnosis not present

## 2020-06-05 DIAGNOSIS — E1122 Type 2 diabetes mellitus with diabetic chronic kidney disease: Secondary | ICD-10-CM | POA: Diagnosis not present

## 2020-06-05 DIAGNOSIS — E1151 Type 2 diabetes mellitus with diabetic peripheral angiopathy without gangrene: Secondary | ICD-10-CM | POA: Diagnosis not present

## 2020-06-05 NOTE — Patient Outreach (Signed)
[facility]) Care Management  [place]  06/06/2020   [name] November 11, 1940 [id number]   Referral Date: 3/21 Referral Source: Hospital liaison Referral Reason: Post hospital discharge Insurance: Next Gen  Outreach attempt #2, successful.  Identity verified.  This care manager introduced self and stated purpose of call.  [facility] care management services explained.    Social: Lives alone, but report she is in need of in home assistance for light housework.  She has [facility] home health, discharge to SNF was recommended but she refused.  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  She uses a power chair for mobility.  Her son lives across the street but unable to provide much assistance due to work schedule.    Report she is able to bathe and get dressed on her own, able to put heat up meals in the microwave.  Discussed the possibility of having prepared meals, she declines stating she wouldn't eat them.  She is able to drive sometimes, orders groceries online and picks them up.  Concern for member living alone discussed, state she has looked into ALF and is willing to consider but wanting to stay home as long as possible.    Conditions: Per chart, has history of RA, GERD, Asthma, COPD, DM, Osteoporosis, CKD, and HLD.  Medications: Reviewed with member, state she is taking as instructed.  Report she is using inhaler and nebulizers as ordered, will start rinsing mouth after each dose.   Appointments: Had follow up appointment with PCP on 3/25, friend took her to that visit but state she is in need of transportation resources.  Encounter Medications:  Outpatient Encounter Medications as of 06/05/2020  Medication Sig Note  . albuterol (VENTOLIN HFA) 108 (90 Base) MCG/ACT inhaler    . arformoterol (BROVANA) 15 MCG/2ML NEBU Take 2 mLs (15 mcg total) by nebulization 2 (two) times daily.   Marland Kitchen atorvastatin (LIPITOR) 20 MG tablet Take 20 mg by mouth daily. 05/22/2020: "THINKS"  medications has been d/c   . B-D UF III MINI PEN NEEDLES 31G X 5 MM MISC SMARTSIG:1 Each SUB-Q 4 Times Daily 05/22/2020: Insulin Supplies   . budesonide (PULMICORT) 0.25 MG/2ML nebulizer solution Take 2 mLs (0.25 mg total) by nebulization 2 (two) times daily.   . cholecalciferol (VITAMIN D) 1000 UNITS tablet Take 1,000 Units by mouth at bedtime.  05/22/2020: OTC Product   . Cyanocobalamin (B-12) 2500 MCG TABS Take 2,500 mcg by mouth at bedtime.  05/22/2020: OTC product   . DULoxetine (CYMBALTA) 60 MG capsule Take 60 mg by mouth at bedtime.  05/22/2020: LF 03/23/20, 90DS   . ezetimibe (ZETIA) 10 MG tablet Take 10 mg by mouth at bedtime.  05/22/2020: LF 05/05/2020, 90 DS   . ferrous sulfate 325 (65 FE) MG tablet Take 325 mg by mouth at bedtime.  05/22/2020: OTC product   . gabapentin (NEURONTIN) 100 MG capsule Take 100 mg by mouth 3 (three) times daily. 05/22/2020: LF  05/04/2020, 10 DS   . insulin NPH-regular Human (70-30) 100 UNIT/ML injection Inject 14 Units into the skin 2 (two) times daily with a meal. 06/05/2020: Taking 20 units twice a day   . Multiple Vitamin (MULTIVITAMIN WITH MINERALS) TABS tablet Take 1 tablet by mouth at bedtime.   Marland Kitchen MYRBETRIQ 50 MG TB24 tablet Take 50 mg by mouth daily. 05/22/2020: LF 05/05/20, 30 DS   . Nebulizers (COMPRESSOR/NEBULIZER) MISC Nebulizer machine/mask/connector for asthma/emphysema   . ONETOUCH VERIO test strip  05/22/2020: Testing supplies   .  pantoprazole (PROTONIX) 40 MG tablet Take one tablet once every morning   . predniSONE (DELTASONE) 5 MG tablet Take 5 mg by mouth daily with breakfast. 01/08/2019: Reports taking 5 mg daily, but does increase with arthritis attacks  . clotrimazole-betamethasone (LOTRISONE) cream Apply topically. (Patient not taking: Reported on 06/05/2020) 05/22/2020: OTC Product   . Omega-3 Fatty Acids (FISH OIL) 1200 MG CAPS Take 1,200 mg by mouth at bedtime.  (Patient not taking: Reported on 06/05/2020) 05/22/2020: OTC Product    No  facility-administered encounter medications on file as of 06/05/2020.    Functional Status:  In your present state of health, do you have any difficulty performing the following activities: 05/23/2020  Hearing? N  Vision? N  Difficulty concentrating or making decisions? N  Walking or climbing stairs? Y  Dressing or bathing? Y  Doing errands, shopping? N  Some recent data might be hidden    Fall/Depression Screening: Fall Risk  06/05/2020 07/23/2019 02/08/2019  Falls in the past year? 1 1 1   Number falls in past yr: 0 0 0  Injury with Fall? 0 0 0  Risk for fall due to : History of fall(s) History of fall(s);Impaired balance/gait;Impaired mobility;Impaired vision;Medication side effect History of fall(s);Medication side effect;Impaired balance/gait  Follow up - Falls evaluation completed;Education provided;Falls prevention discussed Falls evaluation completed;Education provided;Falls prevention discussed   PHQ 2/9 Scores 06/05/2020 07/23/2019 01/08/2019 04/08/2017  PHQ - 2 Score 0 0 0 0    Assessment:  Goals Addressed            This Visit's Progress   . Find Help in My Community       Timeframe:  Long-Range Goal Priority:  High Start Date:              3/28               Expected End Date:      6/29                 Follow Up Date 4/12    - call 211 when I need some help - follow-up on any referrals for help I am given    Why is this important?    Knowing how and where to find help for yourself or family in your neighborhood and community is an important skill.   You will want to take some steps to learn how.    Notes:   3/28 - Referral placed to CSW and Care Guide    . Track and Manage My Symptoms-COPD       Timeframe:  Short-Term Goal Priority:  Medium Start Date:       3/28                      Expected End Date:   4/28                      Follow Up Date 4/12   - develop a rescue plan - eliminate symptom triggers at home - keep follow-up appointments     Why is this important?    Tracking your symptoms and other information about your health helps your doctor plan your care.   Write down the symptoms, the time of day, what you were doing and what medicine you are taking.   You will soon learn how to manage your symptoms.     Notes:   3/28 - COPD education provided  Plan:  Follow-up:  Patient agrees to Care Plan and Follow-up.  Will notify PCP of [facility] involvement.  Will send member education regarding COPD management and action plan,  Will place referrals to [place].  Will follow up with member within the next 2 weeks.  [name], [place], MSN [place] [phone]

## 2020-06-05 NOTE — Telephone Encounter (Signed)
LMTCB for the pt to see if she was able to arrange testing for covid.

## 2020-06-06 DIAGNOSIS — J45909 Unspecified asthma, uncomplicated: Secondary | ICD-10-CM | POA: Diagnosis not present

## 2020-06-06 DIAGNOSIS — E1151 Type 2 diabetes mellitus with diabetic peripheral angiopathy without gangrene: Secondary | ICD-10-CM | POA: Diagnosis not present

## 2020-06-06 DIAGNOSIS — E1122 Type 2 diabetes mellitus with diabetic chronic kidney disease: Secondary | ICD-10-CM | POA: Diagnosis not present

## 2020-06-06 DIAGNOSIS — G8929 Other chronic pain: Secondary | ICD-10-CM | POA: Diagnosis not present

## 2020-06-06 DIAGNOSIS — J849 Interstitial pulmonary disease, unspecified: Secondary | ICD-10-CM | POA: Diagnosis not present

## 2020-06-06 DIAGNOSIS — J439 Emphysema, unspecified: Secondary | ICD-10-CM | POA: Diagnosis not present

## 2020-06-07 ENCOUNTER — Telehealth: Payer: Self-pay | Admitting: *Deleted

## 2020-06-07 NOTE — Patient Outreach (Signed)
[place] [facility]) Care Management  06/07/2020  MEIGHAN TRETO 05/01/1940 [id number]  CSW received referral and attempted initial outreach to pt today.  CSW was unable to reach pt but was able to leave a HIPPA compliant voice message.  CSW will await call back from pt or try again in 3-4 business days if no return call is received.   Eduard Clos, MSW, [place] Worker  [place] [phone]

## 2020-06-07 NOTE — Telephone Encounter (Signed)
Spoke with the pt  She has not set up her own covid test yet but states that she is aware to do so  Reminded her negative PCR result in hand no more then 3-4 days before PFT is required for test to be done  She verbalized understanding  Will go ahead and close encounter

## 2020-06-09 ENCOUNTER — Telehealth: Payer: Self-pay | Admitting: *Deleted

## 2020-06-09 NOTE — Patient Outreach (Signed)
[place]) Care Management  06/09/2020  [name] Aug 21, 1940 [id number]  CSW was able to make initial contact with pt and confirmed pt's identity. CSW introduced self, role and reason for call. Pt reports she lives in [place] and her son lives across the street; he owns the home she resides in.  Pt also reports she lives alone and is finding it harder to do things due to pain. CSW had a lengthy discussion with pt about possible options for long term planning; to include hiring help, placement (ALF/SNF, etc).  CSW educated pt about Medicaid guidelines (likely not eligible at this time). Pt has a daughter in [place] and the one son across the street. She does not feel moving in with either of them is or will be an option. Pt describes her current needs as minimal and thus is likely appropriate for Independent Living or maybe ALF- Assisted Living- both of which are private pay settings and rather pricey.  CSW provided pt with some additional settings to research on the Internet from home and to call/tour as she desires. She also shares she has considered and has sent a neighborhood inquiry out to seek someone who would live in the home with her (separate entrance) and allow them to have free rent for helping her with house chores and personal needs (meals, etc) as needed.... Pt is appreciative of the information provided and plans to investigate further as she considers her options.  Pt states she has had conversation with her kids about this possible need for future placement. CSW encouraged pt to share with her children about our conversation and offered to speak with them as she wishes.  CSW offered pt support and validated her emotions around this topic of transitioning out of her home and away from family into a facility. Pt provided with contact info to reach CSW and agrees to plans for follow up call at end of this month.   Eduard Clos, MSW, [place] Worker   [place] [phone]

## 2020-06-09 NOTE — Telephone Encounter (Signed)
   Telephone encounter was:  Successful.  06/09/2020 Name: [name] MRN: [id number] DOB: 03/20/1940  [name] is a 80 y.o. year old female who is a primary care patient of [name], MD . The community resource team was consulted for assistance with Transportation Needs   Care guide performed the following interventions: Patient provided with information about care guide support team and interviewed to confirm resource needs Discussed resources to assist with Going to set up [facility] transportation for 1st appt on 4/12/22and will be established with [facility] for ongoing appts Follow up call placed to community resources to determine status of patients referral.  Follow Up Plan:  No further follow up planned at this time. The patient has been provided with needed resources.  [place], Care Management  [phone] [address] , [place] [address] Email : [email or web address]

## 2020-06-10 ENCOUNTER — Other Ambulatory Visit: Payer: Self-pay

## 2020-06-10 ENCOUNTER — Encounter (HOSPITAL_COMMUNITY): Payer: Self-pay | Admitting: Emergency Medicine

## 2020-06-10 ENCOUNTER — Emergency Department (HOSPITAL_COMMUNITY)
Admission: EM | Admit: 2020-06-10 | Discharge: 2020-06-10 | Disposition: A | Discharge status: Home / Self Care | Payer: Medicare Other | Attending: Emergency Medicine | Admitting: Emergency Medicine

## 2020-06-10 DIAGNOSIS — R1031 Right lower quadrant pain: Secondary | ICD-10-CM | POA: Diagnosis not present

## 2020-06-10 DIAGNOSIS — R52 Pain, unspecified: Secondary | ICD-10-CM | POA: Diagnosis not present

## 2020-06-10 DIAGNOSIS — Z5321 Procedure and treatment not carried out due to patient leaving prior to being seen by health care provider: Secondary | ICD-10-CM | POA: Insufficient documentation

## 2020-06-10 DIAGNOSIS — R1084 Generalized abdominal pain: Secondary | ICD-10-CM | POA: Diagnosis not present

## 2020-06-10 LAB — CBC WITH DIFFERENTIAL/PLATELET
Abs Immature Granulocytes: 0.17 10*3/uL — ABNORMAL HIGH (ref 0.00–0.07)
Basophils Absolute: 0.1 10*3/uL (ref 0.0–0.1)
Basophils Relative: 1 %
Eosinophils Absolute: 0.3 10*3/uL (ref 0.0–0.5)
Eosinophils Relative: 2 %
HCT: 38.9 % (ref 36.0–46.0)
Hemoglobin: 11.8 g/dL — ABNORMAL LOW (ref 12.0–15.0)
Immature Granulocytes: 1 %
Lymphocytes Relative: 14 %
Lymphs Abs: 1.7 10*3/uL (ref 0.7–4.0)
MCH: 25.9 pg — ABNORMAL LOW (ref 26.0–34.0)
MCHC: 30.3 g/dL (ref 30.0–36.0)
MCV: 85.3 fL (ref 80.0–100.0)
Monocytes Absolute: 0.6 10*3/uL (ref 0.1–1.0)
Monocytes Relative: 5 %
Neutro Abs: 9.2 10*3/uL — ABNORMAL HIGH (ref 1.7–7.7)
Neutrophils Relative %: 77 %
Platelets: 545 10*3/uL — ABNORMAL HIGH (ref 150–400)
RBC: 4.56 MIL/uL (ref 3.87–5.11)
RDW: 15.7 % — ABNORMAL HIGH (ref 11.5–15.5)
WBC: 12.1 10*3/uL — ABNORMAL HIGH (ref 4.0–10.5)
nRBC: 0 % (ref 0.0–0.2)

## 2020-06-10 LAB — COMPREHENSIVE METABOLIC PANEL
ALT: 13 U/L (ref 0–44)
AST: 16 U/L (ref 15–41)
Albumin: 2.8 g/dL — ABNORMAL LOW (ref 3.5–5.0)
Alkaline Phosphatase: 74 U/L (ref 38–126)
Anion gap: 9 (ref 5–15)
BUN: 12 mg/dL (ref 8–23)
CO2: 25 mmol/L (ref 22–32)
Calcium: 8.7 mg/dL — ABNORMAL LOW (ref 8.9–10.3)
Chloride: 103 mmol/L (ref 98–111)
Creatinine, Ser: 0.99 mg/dL (ref 0.44–1.00)
GFR, Estimated: 58 mL/min — ABNORMAL LOW (ref >60)
Glucose, Bld: 174 mg/dL — ABNORMAL HIGH (ref 70–99)
Potassium: 4.7 mmol/L (ref 3.5–5.1)
Sodium: 137 mmol/L (ref 135–145)
Total Bilirubin: 0.7 mg/dL (ref 0.3–1.2)
Total Protein: 6.3 g/dL — ABNORMAL LOW (ref 6.5–8.1)

## 2020-06-10 LAB — LIPASE, BLOOD: Lipase: 27 U/L (ref 11–51)

## 2020-06-10 NOTE — ED Triage Notes (Signed)
Pt to triage via [facility] from home.  Shooting RLQ pain x 5-6 weeks of unknown cause.  Has been seen at [facility] for same.  Denies urinary complaints, nausea, vomiting, and diarrhea.

## 2020-06-10 NOTE — ED Notes (Signed)
Pt  decided  to  leave  due to wait  time

## 2020-06-10 NOTE — ED Provider Notes (Signed)
Patient placed in [facility] Look pathway, seen and evaluated   Chief Complaint: abd pain  HPI:   80 y/o F presents to the ED for eval of abd for several weeks. C/o 3/10 rlq pain that is a sharp/shooting pain. Denies nvd  ROS: abd pain, no nvd, denies urinary sxs (one)  Physical Exam:   Gen: No distress  Neuro: Awake and Alert  Skin: Warm    Focused Exam: mild ruq ttp without guarding or rebound ttp   Initiation of care has begun. The patient has been counseled on the process, plan, and necessity for staying for the completion/evaluation, and the remainder of the medical screening examination  MSE was initiated and I personally evaluated the patient and placed orders (if any) at  8:26 AM on June 10, 2020.  The patient appears stable so that the remainder of the MSE may be completed by another provider.    [place] 06/10/20 [id number]    Blanchie Dessert, MD 06/16/20 2314

## 2020-06-16 ENCOUNTER — Telehealth: Payer: Self-pay | Admitting: Internal Medicine

## 2020-06-16 NOTE — Telephone Encounter (Signed)
   KLAIR LEISING DOB: [birth year] MRN: [id number]   RIDER WAIVER AND RELEASE OF LIABILITY  For purposes of improving physical access to our facilities, [facility] is pleased to partner with third parties to provide [place] patients or other authorized individuals the option of convenient, on-demand ground transportation services (the Technical brewer") through use of the technology service that enables users to request on-demand ground transportation from independent third-party providers.  By opting to use and accept these [employer], I, the undersigned, hereby agree on behalf of myself, and on behalf of any minor child using the [employer] for whom I am the parent or legal guardian, as follows:  1. Government social research officer provided to me are provided by independent third-party transportation providers who are not [employer] or employees and who are unaffiliated with [employer]. 2. [place] is neither a transportation carrier nor a common or public carrier. 3. [place] has no control over the quality or safety of the transportation that occurs as a result of the [employer]. 4. [place] cannot guarantee that any third-party transportation provider will complete any arranged transportation service. 5. [place] makes no representation, warranty, or guarantee regarding the reliability, timeliness, quality, safety, suitability, or availability of any of the Transport Services or that they will be error free. 6. I fully understand that traveling by vehicle involves risks and dangers of serious bodily injury, including permanent disability, paralysis, and death. I agree, on behalf of myself and on behalf of any minor child using the Transport Services for whom I am the parent or legal guardian, that the entire risk arising out of my use of the [employer] remains solely with me, to the maximum extent permitted under applicable law. 7. The [employer]  are provided "as is" and "as available." [place] disclaims all representations and warranties, express, implied or statutory, not expressly set out in these terms, including the implied warranties of merchantability and fitness for a particular purpose. 8. I hereby waive and release [place], its agents, employees, officers, directors, representatives, insurers, attorneys, assigns, successors, subsidiaries, and affiliates from any and all past, present, or future claims, demands, liabilities, actions, causes of action, or suits of any kind directly or indirectly arising from acceptance and use of the [employer]. 9. I further waive and release [place] and its affiliates from all present and future liability and responsibility for any injury or death to persons or damages to property caused by or related to the use of the [employer]. 10. I have read this Waiver and Release of Liability, and I understand the terms used in it and their legal significance. This Waiver is freely and voluntarily given with the understanding that my right (as well as the right of any minor child for whom I am the parent or legal guardian using the [employer]) to legal recourse against  in connection with the [employer] is knowingly surrendered in return for use of these services.   I attest that I read the consent document to [name], gave Ms. Laiche the opportunity to ask questions and answered the questions asked (if any). I affirm that [name] then provided consent for she's participation in this program.     Darrick Meigs [place]

## 2020-06-19 ENCOUNTER — Inpatient Hospital Stay: Payer: Self-pay

## 2020-06-20 ENCOUNTER — Other Ambulatory Visit: Payer: Self-pay | Admitting: *Deleted

## 2020-06-20 NOTE — Patient Outreach (Signed)
[place] [facility]) Care Management  06/20/2020  [name] 1940-04-22 [id number]   Outreach attempt made to complete initial assessment, no answer, HIPAA compliant voice message left.  Will follow up within the next 3-4 business days.  [name], [place], MSN [place] [phone]

## 2020-06-25 DIAGNOSIS — I700 Atherosclerosis of aorta: Secondary | ICD-10-CM | POA: Diagnosis not present

## 2020-06-25 DIAGNOSIS — K529 Noninfective gastroenteritis and colitis, unspecified: Secondary | ICD-10-CM | POA: Diagnosis not present

## 2020-06-25 DIAGNOSIS — K219 Gastro-esophageal reflux disease without esophagitis: Secondary | ICD-10-CM | POA: Diagnosis not present

## 2020-06-25 DIAGNOSIS — H9190 Unspecified hearing loss, unspecified ear: Secondary | ICD-10-CM | POA: Diagnosis not present

## 2020-06-25 DIAGNOSIS — J45909 Unspecified asthma, uncomplicated: Secondary | ICD-10-CM | POA: Diagnosis not present

## 2020-06-25 DIAGNOSIS — M069 Rheumatoid arthritis, unspecified: Secondary | ICD-10-CM | POA: Diagnosis not present

## 2020-06-25 DIAGNOSIS — G8929 Other chronic pain: Secondary | ICD-10-CM | POA: Diagnosis not present

## 2020-06-25 DIAGNOSIS — J849 Interstitial pulmonary disease, unspecified: Secondary | ICD-10-CM | POA: Diagnosis not present

## 2020-06-25 DIAGNOSIS — E669 Obesity, unspecified: Secondary | ICD-10-CM | POA: Diagnosis not present

## 2020-06-25 DIAGNOSIS — D631 Anemia in chronic kidney disease: Secondary | ICD-10-CM | POA: Diagnosis not present

## 2020-06-25 DIAGNOSIS — J439 Emphysema, unspecified: Secondary | ICD-10-CM | POA: Diagnosis not present

## 2020-06-25 DIAGNOSIS — F329 Major depressive disorder, single episode, unspecified: Secondary | ICD-10-CM | POA: Diagnosis not present

## 2020-06-25 DIAGNOSIS — E1122 Type 2 diabetes mellitus with diabetic chronic kidney disease: Secondary | ICD-10-CM | POA: Diagnosis not present

## 2020-06-25 DIAGNOSIS — K8681 Exocrine pancreatic insufficiency: Secondary | ICD-10-CM | POA: Diagnosis not present

## 2020-06-25 DIAGNOSIS — D801 Nonfamilial hypogammaglobulinemia: Secondary | ICD-10-CM | POA: Diagnosis not present

## 2020-06-25 DIAGNOSIS — E785 Hyperlipidemia, unspecified: Secondary | ICD-10-CM | POA: Diagnosis not present

## 2020-06-25 DIAGNOSIS — M797 Fibromyalgia: Secondary | ICD-10-CM | POA: Diagnosis not present

## 2020-06-25 DIAGNOSIS — Z794 Long term (current) use of insulin: Secondary | ICD-10-CM | POA: Diagnosis not present

## 2020-06-25 DIAGNOSIS — Z9181 History of falling: Secondary | ICD-10-CM | POA: Diagnosis not present

## 2020-06-25 DIAGNOSIS — G4733 Obstructive sleep apnea (adult) (pediatric): Secondary | ICD-10-CM | POA: Diagnosis not present

## 2020-06-25 DIAGNOSIS — M810 Age-related osteoporosis without current pathological fracture: Secondary | ICD-10-CM | POA: Diagnosis not present

## 2020-06-25 DIAGNOSIS — N183 Chronic kidney disease, stage 3 unspecified: Secondary | ICD-10-CM | POA: Diagnosis not present

## 2020-06-25 DIAGNOSIS — E1151 Type 2 diabetes mellitus with diabetic peripheral angiopathy without gangrene: Secondary | ICD-10-CM | POA: Diagnosis not present

## 2020-06-25 DIAGNOSIS — Z87891 Personal history of nicotine dependence: Secondary | ICD-10-CM | POA: Diagnosis not present

## 2020-06-26 ENCOUNTER — Other Ambulatory Visit: Payer: Self-pay | Admitting: Internal Medicine

## 2020-06-26 ENCOUNTER — Other Ambulatory Visit (HOSPITAL_COMMUNITY): Payer: Self-pay | Admitting: Internal Medicine

## 2020-06-26 ENCOUNTER — Other Ambulatory Visit: Payer: Self-pay | Admitting: *Deleted

## 2020-06-26 DIAGNOSIS — M5030 Other cervical disc degeneration, unspecified cervical region: Secondary | ICD-10-CM

## 2020-06-26 NOTE — Patient Outreach (Signed)
[place] [facility]) Care Management  06/26/2020  SABINE TENENBAUM 19-Sep-1940 [id number]   Call placed to member to complete initial assessment.  Report this is not a good time to talk as she does not feel well.  She was seen in the ED on 4/2 for abdominal pain, state she is still having that pain.  Report it is waking her up at night and causing her to take shallow breaths instead of deep.  Pain today is 8/10, taking Oxycodone for relief.  State she has not been able to eat anything in the last 24-36 hours, denies any nausea/vominiting.  She has reached out to her PCP office, her provider is not in the office until Wednesday.  She will call again today to request appointment today with a different provider.  Agrees to follow up within the next 2 business days.  [name], [place], MSN [place] [phone]

## 2020-06-27 ENCOUNTER — Other Ambulatory Visit: Payer: Self-pay | Admitting: Internal Medicine

## 2020-06-27 DIAGNOSIS — M5030 Other cervical disc degeneration, unspecified cervical region: Secondary | ICD-10-CM

## 2020-06-28 ENCOUNTER — Other Ambulatory Visit: Payer: Self-pay | Admitting: *Deleted

## 2020-06-28 ENCOUNTER — Other Ambulatory Visit: Payer: Medicare Other

## 2020-06-28 NOTE — Patient Outreach (Signed)
[place] [facility]) Care Management  06/28/2020  ORLANDA FRANKUM 1940/11/27 [id number]   Call placed to member to follow up on recent complaints of abdominal pain and to complete initial assessment.  No answer, phone rang busy x3 attempts.  Will follow up within the next 3-4 business days.  [name], [place], MSN [place] [phone]

## 2020-06-29 DIAGNOSIS — E1122 Type 2 diabetes mellitus with diabetic chronic kidney disease: Secondary | ICD-10-CM | POA: Diagnosis not present

## 2020-06-29 DIAGNOSIS — J45909 Unspecified asthma, uncomplicated: Secondary | ICD-10-CM | POA: Diagnosis not present

## 2020-06-29 DIAGNOSIS — E1151 Type 2 diabetes mellitus with diabetic peripheral angiopathy without gangrene: Secondary | ICD-10-CM | POA: Diagnosis not present

## 2020-06-29 DIAGNOSIS — J439 Emphysema, unspecified: Secondary | ICD-10-CM | POA: Diagnosis not present

## 2020-06-29 DIAGNOSIS — J849 Interstitial pulmonary disease, unspecified: Secondary | ICD-10-CM | POA: Diagnosis not present

## 2020-06-29 DIAGNOSIS — G8929 Other chronic pain: Secondary | ICD-10-CM | POA: Diagnosis not present

## 2020-07-03 ENCOUNTER — Other Ambulatory Visit: Payer: Self-pay | Admitting: *Deleted

## 2020-07-03 ENCOUNTER — Inpatient Hospital Stay: Payer: Self-pay | Admitting: Pulmonary Disease

## 2020-07-03 ENCOUNTER — Inpatient Hospital Stay: Payer: Self-pay

## 2020-07-03 NOTE — Patient Outreach (Signed)
[facility]) Care Management  07/03/2020  [name] 1940/04/10 [id number]   Outreach attempt #2, unsuccessful, HIPAA compliant voice message left.  Will send outreach letter and follow up within the next 3-4 business days.    Update:  Referral received back from member.  State she is feeling better, spoke with PCP over the weekend, prescription for Prednisone, which she report is helping the discomfort.  State she was seen in the home by SW with [facility], who is looking for resources to help member stay in the home.  Per member, her son is no longer able to provide the level of support he once was due to his schedule and family obligations.  She continues to refuse to consider placement.  Call placed to [facility], confirms that she is active with member's care, will continue to search for resources to support member.  Denies any urgent concerns, encouraged to contact this care manager with questions.  Agrees to follow up within the next month.   Goals Addressed            This Visit's Progress   . Find Help in My Community   On track    Timeframe:  Long-Range Goal Priority:  High Start Date:              3/28               Expected End Date:      6/29                 Follow Up Date 4/12    - call 211 when I need some help - follow-up on any referrals for help I am given    Why is this important?    Knowing how and where to find help for yourself or family in your neighborhood and community is an important skill.   You will want to take some steps to learn how.    Notes:   3/28 - Referral placed to CSW and Care Guide  4/25 - Follow up appointment attended, aware of need for ongoing appointments.  Will place referral to care guide for transportation assistance.    . Track and Manage My Symptoms-COPD   On track    Timeframe:  Short-Term Goal Priority:  Medium Start Date:       3/28                      Expected End Date:   4/28                       Follow Up Date 4/12   - develop a rescue plan - eliminate symptom triggers at home - keep follow-up appointments    Why is this important?    Tracking your symptoms and other information about your health helps your doctor plan your care.   Write down the symptoms, the time of day, what you were doing and what medicine you are taking.   You will soon learn how to manage your symptoms.     Notes:   3/28 - COPD education provided  4/25 - Encouraged to use inhaler and nebulizer as instructed, rinse after use        [name], Therapist, sports, MSN [place] [phone]

## 2020-07-04 ENCOUNTER — Other Ambulatory Visit: Payer: Self-pay | Admitting: *Deleted

## 2020-07-04 ENCOUNTER — Ambulatory Visit: Payer: Medicare Other | Admitting: *Deleted

## 2020-07-04 NOTE — Patient Outreach (Signed)
[place] [facility]) Care Management  07/04/2020  [name] [birth year] [id number]  CSW spoke with pt today for follow up from previous assessment/call in regards to long term placement options -vs- in home support.  Pt reports she has a Development worker, community coming into the home who is "working on that".  Pt agrees to this CSW signing off and requests that this CSW not call the [facility] SW as it might confuse things.  CSW reassured pt that no outreach would be made from this CSW.  CSW reminded pt toI told her I would just tiptoe away and for her to keep contact # in case further needs arise or  things change.  CSW will sign off at this time and advise [facility] team of above. Eduard Clos, MSW, [place] Worker  [place] [phone]

## 2020-07-07 ENCOUNTER — Inpatient Hospital Stay: Payer: Self-pay | Admitting: Pulmonary Disease

## 2020-07-07 DIAGNOSIS — E1122 Type 2 diabetes mellitus with diabetic chronic kidney disease: Secondary | ICD-10-CM | POA: Diagnosis not present

## 2020-07-07 DIAGNOSIS — E1151 Type 2 diabetes mellitus with diabetic peripheral angiopathy without gangrene: Secondary | ICD-10-CM | POA: Diagnosis not present

## 2020-07-07 DIAGNOSIS — J849 Interstitial pulmonary disease, unspecified: Secondary | ICD-10-CM | POA: Diagnosis not present

## 2020-07-07 DIAGNOSIS — J45909 Unspecified asthma, uncomplicated: Secondary | ICD-10-CM | POA: Diagnosis not present

## 2020-07-07 DIAGNOSIS — G8929 Other chronic pain: Secondary | ICD-10-CM | POA: Diagnosis not present

## 2020-07-07 DIAGNOSIS — J439 Emphysema, unspecified: Secondary | ICD-10-CM | POA: Diagnosis not present

## 2020-07-11 ENCOUNTER — Other Ambulatory Visit: Payer: Self-pay | Admitting: *Deleted

## 2020-07-11 NOTE — Patient Outreach (Signed)
[place] [facility] Care Management  07/11/2020  [name] 1940-07-08 [id number]   Notified that member called main office with question about a medication.  Call placed back to member, phone rang busy with 3 attempts.  Will await return call from member, if no return call will follow up within the next 2 weeks as planned.  [name], [place], MSN [place] [phone]

## 2020-07-12 DIAGNOSIS — Z4689 Encounter for fitting and adjustment of other specified devices: Secondary | ICD-10-CM | POA: Diagnosis not present

## 2020-07-12 DIAGNOSIS — M069 Rheumatoid arthritis, unspecified: Secondary | ICD-10-CM | POA: Diagnosis not present

## 2020-07-13 DIAGNOSIS — J849 Interstitial pulmonary disease, unspecified: Secondary | ICD-10-CM | POA: Diagnosis not present

## 2020-07-13 DIAGNOSIS — E1122 Type 2 diabetes mellitus with diabetic chronic kidney disease: Secondary | ICD-10-CM | POA: Diagnosis not present

## 2020-07-13 DIAGNOSIS — J45909 Unspecified asthma, uncomplicated: Secondary | ICD-10-CM | POA: Diagnosis not present

## 2020-07-13 DIAGNOSIS — J439 Emphysema, unspecified: Secondary | ICD-10-CM | POA: Diagnosis not present

## 2020-07-13 DIAGNOSIS — G8929 Other chronic pain: Secondary | ICD-10-CM | POA: Diagnosis not present

## 2020-07-13 DIAGNOSIS — E1151 Type 2 diabetes mellitus with diabetic peripheral angiopathy without gangrene: Secondary | ICD-10-CM | POA: Diagnosis not present

## 2020-07-17 ENCOUNTER — Other Ambulatory Visit: Payer: Self-pay | Admitting: *Deleted

## 2020-07-17 NOTE — Patient Outreach (Signed)
[place] [facility]) Care Management  07/17/2020  [name] 1940-09-01 [id number]   Voice message received from member requesting call back.  Outgoing call placed back to member, she expresses frustration regarding this week being her last week with the home health team and wanted to follow up with pending resources prior to her case closing.  Advised to call Anderson Malta, SW with [facility], to follow up as she thought this case manager with also with the home health team.  Advised that this care manager will also reach out to [place] to follow up on pending resources and collaborate on plan of care going forward.  Will follow up with member within the next 2 weeks as planned.  [name], [place], MSN [place] [phone]

## 2020-07-18 DIAGNOSIS — M47812 Spondylosis without myelopathy or radiculopathy, cervical region: Secondary | ICD-10-CM | POA: Diagnosis not present

## 2020-07-18 DIAGNOSIS — M50222 Other cervical disc displacement at C5-C6 level: Secondary | ICD-10-CM | POA: Diagnosis not present

## 2020-07-18 DIAGNOSIS — M4802 Spinal stenosis, cervical region: Secondary | ICD-10-CM | POA: Diagnosis not present

## 2020-07-18 DIAGNOSIS — M4312 Spondylolisthesis, cervical region: Secondary | ICD-10-CM | POA: Diagnosis not present

## 2020-07-18 DIAGNOSIS — M40202 Unspecified kyphosis, cervical region: Secondary | ICD-10-CM | POA: Diagnosis not present

## 2020-07-20 ENCOUNTER — Other Ambulatory Visit: Payer: Self-pay | Admitting: *Deleted

## 2020-07-20 DIAGNOSIS — J45909 Unspecified asthma, uncomplicated: Secondary | ICD-10-CM | POA: Diagnosis not present

## 2020-07-20 DIAGNOSIS — E1122 Type 2 diabetes mellitus with diabetic chronic kidney disease: Secondary | ICD-10-CM | POA: Diagnosis not present

## 2020-07-20 DIAGNOSIS — J439 Emphysema, unspecified: Secondary | ICD-10-CM | POA: Diagnosis not present

## 2020-07-20 DIAGNOSIS — G8929 Other chronic pain: Secondary | ICD-10-CM | POA: Diagnosis not present

## 2020-07-20 DIAGNOSIS — E1151 Type 2 diabetes mellitus with diabetic peripheral angiopathy without gangrene: Secondary | ICD-10-CM | POA: Diagnosis not present

## 2020-07-20 DIAGNOSIS — J849 Interstitial pulmonary disease, unspecified: Secondary | ICD-10-CM | POA: Diagnosis not present

## 2020-07-20 NOTE — Patient Outreach (Signed)
[place] [facility]) Care Management  07/20/2020  [name] 23-Apr-1940 [id number]   Incoming call received from member, state she is now ready to pursue ALF/SNF placement.  In agreement that she is not able to care for herself independently in the home anymore.  [facility] has signed off, will place new referral to [place] for placement assistance.  Report she has appointment with PCP on 5/18, will discuss with him at that time.  This RNCM will follow up with member within the next 2 weeks as planned.  [name], [place], MSN [place] [phone]

## 2020-07-21 ENCOUNTER — Other Ambulatory Visit: Payer: Self-pay

## 2020-07-21 ENCOUNTER — Encounter (HOSPITAL_COMMUNITY): Payer: Self-pay | Admitting: Internal Medicine

## 2020-07-21 ENCOUNTER — Telehealth: Payer: Self-pay | Admitting: *Deleted

## 2020-07-21 ENCOUNTER — Inpatient Hospital Stay (HOSPITAL_COMMUNITY)
Admission: AD | Admit: 2020-07-21 | Discharge: 2020-07-26 | DRG: 175 | Disposition: A | Discharge status: Skilled Nursing Facility | Payer: Medicare Other | Source: Other Acute Inpatient Hospital | Attending: Internal Medicine | Admitting: Internal Medicine

## 2020-07-21 DIAGNOSIS — J45909 Unspecified asthma, uncomplicated: Secondary | ICD-10-CM | POA: Diagnosis present

## 2020-07-21 DIAGNOSIS — D72829 Elevated white blood cell count, unspecified: Secondary | ICD-10-CM | POA: Diagnosis present

## 2020-07-21 DIAGNOSIS — N179 Acute kidney failure, unspecified: Secondary | ICD-10-CM | POA: Diagnosis present

## 2020-07-21 DIAGNOSIS — Z90710 Acquired absence of both cervix and uterus: Secondary | ICD-10-CM | POA: Diagnosis not present

## 2020-07-21 DIAGNOSIS — I959 Hypotension, unspecified: Secondary | ICD-10-CM | POA: Diagnosis present

## 2020-07-21 DIAGNOSIS — R059 Cough, unspecified: Secondary | ICD-10-CM | POA: Diagnosis not present

## 2020-07-21 DIAGNOSIS — M810 Age-related osteoporosis without current pathological fracture: Secondary | ICD-10-CM | POA: Diagnosis not present

## 2020-07-21 DIAGNOSIS — E119 Type 2 diabetes mellitus without complications: Secondary | ICD-10-CM

## 2020-07-21 DIAGNOSIS — R6883 Chills (without fever): Secondary | ICD-10-CM | POA: Diagnosis not present

## 2020-07-21 DIAGNOSIS — M797 Fibromyalgia: Secondary | ICD-10-CM | POA: Diagnosis not present

## 2020-07-21 DIAGNOSIS — N183 Chronic kidney disease, stage 3 unspecified: Secondary | ICD-10-CM | POA: Diagnosis not present

## 2020-07-21 DIAGNOSIS — D849 Immunodeficiency, unspecified: Secondary | ICD-10-CM | POA: Diagnosis not present

## 2020-07-21 DIAGNOSIS — Z20822 Contact with and (suspected) exposure to covid-19: Secondary | ICD-10-CM | POA: Diagnosis present

## 2020-07-21 DIAGNOSIS — K529 Noninfective gastroenteritis and colitis, unspecified: Secondary | ICD-10-CM | POA: Diagnosis not present

## 2020-07-21 DIAGNOSIS — K219 Gastro-esophageal reflux disease without esophagitis: Secondary | ICD-10-CM | POA: Diagnosis not present

## 2020-07-21 DIAGNOSIS — E1122 Type 2 diabetes mellitus with diabetic chronic kidney disease: Secondary | ICD-10-CM | POA: Diagnosis present

## 2020-07-21 DIAGNOSIS — M069 Rheumatoid arthritis, unspecified: Secondary | ICD-10-CM | POA: Diagnosis not present

## 2020-07-21 DIAGNOSIS — H903 Sensorineural hearing loss, bilateral: Secondary | ICD-10-CM | POA: Diagnosis not present

## 2020-07-21 DIAGNOSIS — I6782 Cerebral ischemia: Secondary | ICD-10-CM | POA: Diagnosis not present

## 2020-07-21 DIAGNOSIS — D800 Hereditary hypogammaglobulinemia: Secondary | ICD-10-CM | POA: Diagnosis not present

## 2020-07-21 DIAGNOSIS — G8929 Other chronic pain: Secondary | ICD-10-CM | POA: Diagnosis present

## 2020-07-21 DIAGNOSIS — J9601 Acute respiratory failure with hypoxia: Secondary | ICD-10-CM | POA: Diagnosis not present

## 2020-07-21 DIAGNOSIS — E785 Hyperlipidemia, unspecified: Secondary | ICD-10-CM | POA: Diagnosis present

## 2020-07-21 DIAGNOSIS — M06072 Rheumatoid arthritis without rheumatoid factor, left ankle and foot: Secondary | ICD-10-CM | POA: Diagnosis present

## 2020-07-21 DIAGNOSIS — R5381 Other malaise: Secondary | ICD-10-CM | POA: Diagnosis not present

## 2020-07-21 DIAGNOSIS — R0602 Shortness of breath: Secondary | ICD-10-CM | POA: Diagnosis not present

## 2020-07-21 DIAGNOSIS — Z7951 Long term (current) use of inhaled steroids: Secondary | ICD-10-CM | POA: Diagnosis not present

## 2020-07-21 DIAGNOSIS — M06071 Rheumatoid arthritis without rheumatoid factor, right ankle and foot: Secondary | ICD-10-CM | POA: Diagnosis not present

## 2020-07-21 DIAGNOSIS — R32 Unspecified urinary incontinence: Secondary | ICD-10-CM | POA: Diagnosis not present

## 2020-07-21 DIAGNOSIS — N1831 Chronic kidney disease, stage 3a: Secondary | ICD-10-CM | POA: Diagnosis present

## 2020-07-21 DIAGNOSIS — N281 Cyst of kidney, acquired: Secondary | ICD-10-CM | POA: Diagnosis not present

## 2020-07-21 DIAGNOSIS — G319 Degenerative disease of nervous system, unspecified: Secondary | ICD-10-CM | POA: Diagnosis not present

## 2020-07-21 DIAGNOSIS — J90 Pleural effusion, not elsewhere classified: Secondary | ICD-10-CM | POA: Diagnosis not present

## 2020-07-21 DIAGNOSIS — Z66 Do not resuscitate: Secondary | ICD-10-CM | POA: Diagnosis present

## 2020-07-21 DIAGNOSIS — I2609 Other pulmonary embolism with acute cor pulmonale: Secondary | ICD-10-CM | POA: Diagnosis not present

## 2020-07-21 DIAGNOSIS — Z7952 Long term (current) use of systemic steroids: Secondary | ICD-10-CM | POA: Diagnosis not present

## 2020-07-21 DIAGNOSIS — J9811 Atelectasis: Secondary | ICD-10-CM | POA: Diagnosis not present

## 2020-07-21 DIAGNOSIS — D509 Iron deficiency anemia, unspecified: Secondary | ICD-10-CM | POA: Diagnosis not present

## 2020-07-21 DIAGNOSIS — E611 Iron deficiency: Secondary | ICD-10-CM | POA: Diagnosis not present

## 2020-07-21 DIAGNOSIS — J84115 Respiratory bronchiolitis interstitial lung disease: Secondary | ICD-10-CM | POA: Diagnosis not present

## 2020-07-21 DIAGNOSIS — J439 Emphysema, unspecified: Secondary | ICD-10-CM | POA: Diagnosis not present

## 2020-07-21 DIAGNOSIS — Z7401 Bed confinement status: Secondary | ICD-10-CM | POA: Diagnosis not present

## 2020-07-21 DIAGNOSIS — H9312 Tinnitus, left ear: Secondary | ICD-10-CM | POA: Diagnosis not present

## 2020-07-21 DIAGNOSIS — Z87891 Personal history of nicotine dependence: Secondary | ICD-10-CM

## 2020-07-21 DIAGNOSIS — R0902 Hypoxemia: Secondary | ICD-10-CM | POA: Diagnosis not present

## 2020-07-21 DIAGNOSIS — R531 Weakness: Secondary | ICD-10-CM | POA: Diagnosis not present

## 2020-07-21 DIAGNOSIS — Z794 Long term (current) use of insulin: Secondary | ICD-10-CM | POA: Diagnosis not present

## 2020-07-21 DIAGNOSIS — J449 Chronic obstructive pulmonary disease, unspecified: Secondary | ICD-10-CM | POA: Diagnosis not present

## 2020-07-21 DIAGNOSIS — I2699 Other pulmonary embolism without acute cor pulmonale: Principal | ICD-10-CM | POA: Diagnosis present

## 2020-07-21 DIAGNOSIS — G4730 Sleep apnea, unspecified: Secondary | ICD-10-CM | POA: Diagnosis not present

## 2020-07-21 DIAGNOSIS — I2694 Multiple subsegmental pulmonary emboli without acute cor pulmonale: Secondary | ICD-10-CM | POA: Diagnosis not present

## 2020-07-21 DIAGNOSIS — I2602 Saddle embolus of pulmonary artery with acute cor pulmonale: Secondary | ICD-10-CM | POA: Diagnosis not present

## 2020-07-21 DIAGNOSIS — R509 Fever, unspecified: Secondary | ICD-10-CM | POA: Diagnosis not present

## 2020-07-21 DIAGNOSIS — R410 Disorientation, unspecified: Secondary | ICD-10-CM | POA: Diagnosis not present

## 2020-07-21 DIAGNOSIS — R52 Pain, unspecified: Secondary | ICD-10-CM | POA: Diagnosis not present

## 2020-07-21 DIAGNOSIS — E11618 Type 2 diabetes mellitus with other diabetic arthropathy: Secondary | ICD-10-CM | POA: Diagnosis not present

## 2020-07-21 MED ORDER — INSULIN ASPART 100 UNIT/ML IJ SOLN
0.0000 [IU] | Freq: Every day | INTRAMUSCULAR | Status: DC
  Administered 2020-07-24: 2 [IU] via SUBCUTANEOUS
  Administered 2020-07-25: 3 [IU] via SUBCUTANEOUS

## 2020-07-21 MED ORDER — KETOROLAC TROMETHAMINE 15 MG/ML IJ SOLN
15.0000 mg | Freq: Three times a day (TID) | INTRAMUSCULAR | Status: AC | PRN
Start: 2020-07-21 — End: 2020-07-22
  Administered 2020-07-21 – 2020-07-22 (×2): 15 mg via INTRAVENOUS
  Filled 2020-07-21 (×2): qty 1

## 2020-07-21 MED ORDER — INSULIN ASPART 100 UNIT/ML IJ SOLN
0.0000 [IU] | Freq: Three times a day (TID) | INTRAMUSCULAR | Status: DC
  Administered 2020-07-22 (×3): 3 [IU] via SUBCUTANEOUS
  Administered 2020-07-23: 2 [IU] via SUBCUTANEOUS
  Administered 2020-07-24 (×2): 5 [IU] via SUBCUTANEOUS
  Administered 2020-07-25 (×2): 3 [IU] via SUBCUTANEOUS
  Administered 2020-07-25 – 2020-07-26 (×2): 8 [IU] via SUBCUTANEOUS
  Administered 2020-07-26: 3 [IU] via SUBCUTANEOUS
  Administered 2020-07-26: 5 [IU] via SUBCUTANEOUS

## 2020-07-21 MED ORDER — ACETAMINOPHEN 325 MG PO TABS
650.0000 mg | ORAL_TABLET | Freq: Four times a day (QID) | ORAL | Status: DC | PRN
  Administered 2020-07-22 – 2020-07-26 (×3): 650 mg via ORAL
  Filled 2020-07-21 (×3): qty 2

## 2020-07-21 MED ORDER — ACETAMINOPHEN 650 MG RE SUPP: 650.0000 mg | Freq: Four times a day (QID) | RECTAL | Status: DC | PRN

## 2020-07-21 NOTE — Patient Outreach (Signed)
[place] [facility]) Care Management  07/21/2020  [name] 01/28/1941 [id number]  CSW received referral to reach out to pt regarding placement and her wish to seek placement and receive paperwork prior to her PCP appointment on 07/26/2020.  CSW attempted phone outreach to pt and was unsuccessful reaching pt but was able to leave a HIPPA compliant voice message.   CSW familiar with pt from recent involvement when pt asked that this CSW not proceed with placement assistance because [facility] SW was doing so.  CSW has left a message for [facility] SW to call with any updates on the status of their progress with this and will await callback from them as well.   Eduard Clos, MSW, [place] Worker  [place] [phone]

## 2020-07-21 NOTE — Telephone Encounter (Signed)
CSW received a call back from Krystal Eaton, Social Worker with [facility] agency who had been working with pt. She last spoke with pt on 07/17/2020 and at that time  had decided she did not want placement. Per Anderson Malta, it appears pt has been quite indecisive about what she wants to do; stay in her home versus moving into a facility (ALF, retirement community, etc). I have contacted pt on yesterday and left a message for callback and await her call. CSW will call pt again next week if no return call is received.    Eduard Clos, MSW, [place] Worker  [place] [phone]

## 2020-07-21 NOTE — H&P (Signed)
History and Physical    [name] JEH:[id number] DOB: 01-Mar-1941 DOA: 07/21/2020  PCP: [name], MD Patient coming from: [facility]  Chief Complaint: Chest pain  HPI: [name] is a 80 y.o. female with medical history significant of acquired hypogammaglobulinemia, anemia, asthma, emphysema, insulin-dependent type 2 diabetes, CKD stage IIIa, dysphagia, fibromyalgia, chronic pain, GERD, hearing loss, rheumatoid arthritis, OSA transferred from [facility] ED for management of acute PE with CT evidence of right heart strain.  Patient went to [facility] ED today with complaints of shortness of breath, cough, fever, nausea, vomiting, and malaise.  On EMS arrival, she was satting 88% on room air.  She was in slight respiratory distress on arrival to the ED.  Blood pressure was soft in the ED with systolic in the 37C and she was slightly tachycardic.  Satting well on 3 L supplemental oxygen.  Labs showing WBC 11.3, hemoglobin 11.6, hematocrit 37.3, platelet count 444K.  Sodium 135, potassium 4.7, chloride 104, bicarb 23, BUN 13, creatinine 0.8, glucose 180.  INR 1.0.  LFTs normal.  Troponin <0.01.  Pro BNP 1050.  Procalcitonin 0.11.  Lactic acid 1.7.  Respiratory viral panel including SARS-CoV-2 PCR negative.  UA without signs of infection.  CT angiogram chest showing acute bilateral PE with significantly greater clot burden in the left lung.  Showing CT evidence of right heart strain (RV/LV ratio of 1.3) consistent with at least submassive (intermediate risk) PE.  EKG showing sinus tachycardia, LAFB (no significant change since prior tracing). Patient was started on IV heparin and given 2 L normal saline boluses. ED physician discussed the case with Dr. Tacy Learn with critical care at [facility] who felt that the patient did not meet criteria for thrombolysis and recommended admission to progressive care unit, however, reconsult critical care service if her blood pressure continues to drop.  He also  advised to stop IV fluids.  Patient states she has had right-sided chest pain for the past few weeks and now it has moved to the left side.  Today she felt short of breath and called EMS.  Denies history of blood clots.  Denies history of any leg pain or swelling.  Reports limited mobility at baseline due to her chronic joint pains from rheumatoid arthritis.  Denies history of any recent surgeries.  Reports feeling nauseous for the past few days but has not vomited.  She is able to tolerate p.o. intake.  She is having regular bowel movements and no abdominal pain.  No other complaints.  Review of Systems:  All systems reviewed and apart from history of presenting illness, are negative.  Past Medical History:  Diagnosis Date  . Asthma   . Complication of anesthesia    Anesthesia told her years ago she got too cold during surgery  . Diabetes mellitus without complication ([place])   . Emphysema   . Fibromyalgia   . Osteoporosis   . Rheumatoid arthritis(714.0)   . Sleep apnea    no cpap    Past Surgical History:  Procedure Laterality Date  . ABDOMINAL HYSTERECTOMY    . ANKLE FRACTURE SURGERY  2007  . BREAST SURGERY     mammoplasty and then removed  . FOOT SURGERY     numerous  . I & D EXTREMITY Right 10/26/2014   Procedure: IRRIGATION AND DEBRIDEMENT RIGHT KNEE ;  Surgeon: Gaynelle Arabian, MD;  Location: [facility];  Service: Orthopedics;  Laterality: Right;  . IRRIGATION AND DEBRIDEMENT KNEE Right 03/09/2017  Procedure: IRRIGATION AND DEBRIDEMENT RIGHT KNEE PRE-PATELLAR BURSA;  Surgeon: Susa Day, MD;  Location: [facility];  Service: Orthopedics;  Laterality: Right;  . KNEE BURSECTOMY Right 09/16/2014   Procedure: EXCISON OF PRE PATELLA BURSA RIGHT KNEE ;  Surgeon: Gaynelle Arabian, MD;  Location: [facility];  Service: Orthopedics;  Laterality: Right;  . PARTIAL HYSTERECTOMY  1970  . TOTAL KNEE ARTHROPLASTY  2005 / 2006   x2     reports that she quit smoking about 26 years ago. She has never used  smokeless tobacco. She reports that she does not drink alcohol and does not use drugs.  Allergies  Allergen Reactions  . Penicillins Hives    Has patient had a PCN reaction causing immediate rash, facial/tongue/throat swelling, SOB or lightheadedness with hypotension: Yes Has patient had a PCN reaction causing severe rash involving mucus membranes or skin necrosis: No Has patient had a PCN reaction that required hospitalization: No Has patient had a PCN reaction occurring within the last 10 years: No If all of the above answers are "NO", then may proceed with Cephalosporin use. Patient has tolerated ceftriaxone in the recent past.  . Sulfamethoxazole-Trimethoprim Nausea And Vomiting    REACTION: stomach problems  . Meperidine Other (See Comments)  . Sulfa Antibiotics Nausea And Vomiting    REACTION: stomach problems  . Sulfonamide Derivatives Nausea And Vomiting    REACTION: stomach problems    Family History  Problem Relation Age of Onset  . Hypertension Other   . Diabetes Other   . Stroke Other   . Diabetes Mother   . Diabetes Father   . Diabetes Sister   . Diabetes Maternal Aunt   . Cancer Paternal Uncle   . Cancer Paternal Grandmother   . Breast cancer Paternal Grandmother     Prior to Admission medications   Medication Sig Start Date End Date Taking? Authorizing Provider  albuterol (VENTOLIN HFA) 108 (90 Base) MCG/ACT inhaler  07/05/19   [provider]  arformoterol (BROVANA) 15 MCG/2ML NEBU Take 2 mLs (15 mcg total) by nebulization 2 (two) times daily. 05/26/20 06/25/20  [name], MD  atorvastatin (LIPITOR) 20 MG tablet Take 20 mg by mouth daily. 11/29/19   [provider]  B-D UF III MINI PEN NEEDLES 31G X 5 MM MISC SMARTSIG:1 Each SUB-Q 4 Times Daily 10/22/19   [provider]  budesonide (PULMICORT) 0.25 MG/2ML nebulizer solution Take 2 mLs (0.25 mg total) by nebulization 2 (two) times daily. 05/26/20 06/25/20  [name], MD  cholecalciferol  (VITAMIN D) 1000 UNITS tablet Take 1,000 Units by mouth at bedtime.     [provider]  clotrimazole-betamethasone (LOTRISONE) cream Apply topically. Patient not taking: Reported on 06/05/2020 10/07/19   [provider]  Cyanocobalamin (B-12) 2500 MCG TABS Take 2,500 mcg by mouth at bedtime.     [provider]  DULoxetine (CYMBALTA) 60 MG capsule Take 60 mg by mouth at bedtime.     [provider]  ezetimibe (ZETIA) 10 MG tablet Take 10 mg by mouth at bedtime.  11/04/16   [provider]  ferrous sulfate 325 (65 FE) MG tablet Take 325 mg by mouth at bedtime.     [provider]  gabapentin (NEURONTIN) 100 MG capsule Take 100 mg by mouth 3 (three) times daily. 11/17/19   [provider]  insulin NPH-regular Human (70-30) 100 UNIT/ML injection Inject 14 Units into the skin 2 (two) times daily with a meal.    [provider]  Multiple Vitamin (MULTIVITAMIN WITH MINERALS) TABS tablet Take 1 tablet by mouth at bedtime.    [provider]  MYRBETRIQ 50 MG TB24 tablet Take 50 mg by mouth daily. 03/29/20   [provider]  Nebulizers (COMPRESSOR/NEBULIZER) MISC Nebulizer machine/mask/connector for asthma/emphysema 05/26/20   [name], MD  Omega-3 Fatty Acids (FISH OIL) 1200 MG CAPS Take 1,200 mg by mouth at bedtime.  Patient not taking: Reported on 06/05/2020    [provider]  [facility] VERIO test strip  10/13/18   [provider]  pantoprazole (PROTONIX) 40 MG tablet Take one tablet once every morning 05/26/20   [name], MD  predniSONE (DELTASONE) 5 MG tablet Take 5 mg by mouth daily with breakfast.    [provider]    Physical Exam: Vitals:   07/21/20 2200 07/21/20 2259 07/21/20 2300 07/22/20 0000  BP: (!) 109/55  121/67   Pulse: (!) 102 (!) 104 (!) 102   Resp: 20 (!) 21 (!) 26   Temp: 99.4 F (37.4 C)     TempSrc: Oral     SpO2: 95% 97% 95%   Weight: 77.8 kg   78.5 kg     Physical Exam Constitutional:      General: She is not in acute distress. HENT:     Head: Normocephalic and atraumatic.  Eyes:     Extraocular Movements: Extraocular movements intact.     Conjunctiva/sclera: Conjunctivae normal.  Cardiovascular:     Rate and Rhythm: Regular rhythm.     Pulses: Normal pulses.     Comments: Mildly tachycardic Pulmonary:     Effort: Pulmonary effort is normal. No respiratory distress.     Breath sounds: No wheezing or rales.     Comments: Satting 98-99% on 2 L supplemental oxygen Abdominal:     General: Bowel sounds are normal. There is no distension.     Palpations: Abdomen is soft.     Tenderness: There is no abdominal tenderness.  Musculoskeletal:        General: No swelling or tenderness.     Cervical back: Normal range of motion and neck supple.  Skin:    General: Skin is warm and dry.  Neurological:     General: No focal deficit present.     Mental Status: She is alert and oriented to person, place, and time.     Labs on Admission: I have personally reviewed following labs and imaging studies  CBC: No results for input(s): WBC, NEUTROABS, HGB, HCT, MCV, PLT in the last 168 hours. Basic Metabolic Panel: No results for input(s): NA, K, CL, CO2, GLUCOSE, BUN, CREATININE, CALCIUM, MG, PHOS in the last 168 hours. GFR: CrCl cannot be calculated (Patient's most recent lab result is older than the maximum 21 days allowed.). Liver Function Tests: No results for input(s): AST, ALT, ALKPHOS, BILITOT, PROT, ALBUMIN in the last 168 hours. No results for input(s): LIPASE, AMYLASE in the last 168 hours. No results for input(s): AMMONIA in the last 168 hours. Coagulation Profile: No results for input(s): INR, PROTIME in the last 168 hours. Cardiac Enzymes: No results for input(s): CKTOTAL, CKMB, CKMBINDEX, TROPONINI in the last 168 hours. BNP (last 3 results) No results for input(s): PROBNP in the last 8760 hours. HbA1C: Recent Labs     07/21/20 2341  HGBA1C 8.1*   CBG: Recent Labs  Lab 07/22/20 0105  GLUCAP 146*   Lipid Profile: No results for input(s): CHOL, HDL, LDLCALC, TRIG, CHOLHDL, LDLDIRECT in the last  72 hours. Thyroid Function Tests: No results for input(s): TSH, T4TOTAL, FREET4, T3FREE, THYROIDAB in the last 72 hours. Anemia Panel: No results for input(s): VITAMINB12, FOLATE, FERRITIN, TIBC, IRON, RETICCTPCT in the last 72 hours. Urine analysis:    Component Value Date/Time   COLORURINE YELLOW 05/22/2020 2129   APPEARANCEUR CLEAR 05/22/2020 2129   LABSPEC <1.005 (L) 05/22/2020 2129   PHURINE 5.0 05/22/2020 2129   GLUCOSEU 50 (A) 05/22/2020 2129   GLUCOSEU NEGATIVE 12/24/2016 1239   HGBUR NEGATIVE 05/22/2020 2129   BILIRUBINUR NEGATIVE 05/22/2020 2129   KETONESUR 5 (A) 05/22/2020 2129   PROTEINUR NEGATIVE 05/22/2020 2129   UROBILINOGEN 0.2 12/24/2016 1239   NITRITE NEGATIVE 05/22/2020 2129   LEUKOCYTESUR NEGATIVE 05/22/2020 2129    Radiological Exams on Admission: No results found.  Assessment/Plan Principal Problem:   Acute pulmonary embolism (HCC) Active Problems:   Asthma   Rheumatoid arthritis involving both feet with negative rheumatoid factor (HCC)   Type 2 diabetes mellitus without complication, without long-term current use of insulin (HCC)   Acute hypoxemic respiratory failure (HCC)   Acute hypoxemic respiratory failure secondary to acute bilateral PE with CT evidence of right heart strain Satting 88% on room air, currently requiring 2 L supplemental oxygen to maintain sats in the upper 90s. CT angiogram chest done at [facility] showing acute bilateral PE with significantly greater clot burden in the left lung.  Showing CT evidence of right heart strain (RV/LV ratio of 1.3) consistent with at least submassive (intermediate risk) PE.  Labs at [facility] showing troponin <0.01 and proBNP 1050.  Blood pressure was soft at [facility] ED and was given 2 L boluses.  ED physician discussed the  case with Dr. Tacy Learn with critical care at [facility] who felt that the patient did not meet criteria for thrombolysis and recommended admission to progressive care unit, however, reconsult critical care service if her blood pressure continues to drop.  Recommended stopping IV fluids.  Since arrival to [facility], her blood pressure has been stable.  PE likely secondary to chronic immobility due to history of rheumatoid arthritis. -Continue IV heparin.  Toradol as needed for pleuritic chest pain.  Serial troponin and BNP.  Echocardiogram and bilateral lower extremity Dopplers ordered.  Continuous pulse ox.  Continue supplemental oxygen, wean as tolerated.  Asthma Stable.  No signs of acute exacerbation. -Resume home inhalers after pharmacy med rec is done.  Insulin-dependent type 2 diabetes -Check A1c.  Sliding scale insulin moderate ACHS.  Resume home basal insulin after pharmacy med rec is done.  Rheumatoid arthritis -Resume home prednisone after pharmacy med rec is done  DVT prophylaxis: Heparin Code Status: Patient wishes to be DNR/DNI. Family Communication: No family available at this time. Disposition Plan: Status is: Inpatient  Remains inpatient appropriate because:Inpatient level of care appropriate due to severity of illness   Dispo: The patient is from: Home              Anticipated d/c is to: Home              Patient currently is not medically stable to d/c.   Difficult to place patient No  Level of care: Progressive care  Progressive The medical decision making on this patient was of high complexity and the patient is at high risk for clinical deterioration, therefore this is a level 3 visit.  Shela Leff MD [facility]  If 7PM-7AM, please contact night-coverage [email or web address]  07/22/2020, 1:56 AM

## 2020-07-22 ENCOUNTER — Inpatient Hospital Stay (HOSPITAL_COMMUNITY): Payer: Medicare Other

## 2020-07-22 DIAGNOSIS — I2699 Other pulmonary embolism without acute cor pulmonale: Secondary | ICD-10-CM

## 2020-07-22 DIAGNOSIS — I2602 Saddle embolus of pulmonary artery with acute cor pulmonale: Secondary | ICD-10-CM | POA: Diagnosis not present

## 2020-07-22 DIAGNOSIS — M06072 Rheumatoid arthritis without rheumatoid factor, left ankle and foot: Secondary | ICD-10-CM

## 2020-07-22 DIAGNOSIS — E119 Type 2 diabetes mellitus without complications: Secondary | ICD-10-CM

## 2020-07-22 DIAGNOSIS — J45909 Unspecified asthma, uncomplicated: Secondary | ICD-10-CM

## 2020-07-22 DIAGNOSIS — J9601 Acute respiratory failure with hypoxia: Secondary | ICD-10-CM

## 2020-07-22 DIAGNOSIS — M06071 Rheumatoid arthritis without rheumatoid factor, right ankle and foot: Secondary | ICD-10-CM

## 2020-07-22 LAB — HEMOGLOBIN A1C
Hgb A1c MFr Bld: 8.1 % — ABNORMAL HIGH (ref 4.8–5.6)
Mean Plasma Glucose: 185.77 mg/dL

## 2020-07-22 LAB — CBC
HCT: 31.7 % — ABNORMAL LOW (ref 36.0–46.0)
Hemoglobin: 9.6 g/dL — ABNORMAL LOW (ref 12.0–15.0)
MCH: 24.2 pg — ABNORMAL LOW (ref 26.0–34.0)
MCHC: 30.3 g/dL (ref 30.0–36.0)
MCV: 80.1 fL (ref 80.0–100.0)
Platelets: 406 10*3/uL — ABNORMAL HIGH (ref 150–400)
RBC: 3.96 MIL/uL (ref 3.87–5.11)
RDW: 15.9 % — ABNORMAL HIGH (ref 11.5–15.5)
WBC: 14 10*3/uL — ABNORMAL HIGH (ref 4.0–10.5)
nRBC: 0 % (ref 0.0–0.2)

## 2020-07-22 LAB — GLUCOSE, CAPILLARY
Glucose-Capillary: 146 mg/dL — ABNORMAL HIGH (ref 70–99)
Glucose-Capillary: 162 mg/dL — ABNORMAL HIGH (ref 70–99)
Glucose-Capillary: 153 mg/dL — ABNORMAL HIGH (ref 70–99)
Glucose-Capillary: 157 mg/dL — ABNORMAL HIGH (ref 70–99)
Glucose-Capillary: 146 mg/dL — ABNORMAL HIGH (ref 70–99)

## 2020-07-22 LAB — ECHOCARDIOGRAM COMPLETE
AR max vel: 1.15 cm2
AV Area VTI: 1.21 cm2
AV Area mean vel: 1.11 cm2
AV Mean grad: 9.8 mmHg
AV Peak grad: 17.6 mmHg
Ao pk vel: 2.1 m/s
Area-P 1/2: 3.06 cm2
Calc EF: 49.5 %
S' Lateral: 2.9 cm
Single Plane A2C EF: 43.1 %
Single Plane A4C EF: 56 %
Weight: 2768.98 oz

## 2020-07-22 LAB — HEPARIN LEVEL (UNFRACTIONATED)
Heparin Unfractionated: <0.1 IU/mL — ABNORMAL LOW (ref 0.30–0.70)
Heparin Unfractionated: 0.11 IU/mL — ABNORMAL LOW (ref 0.30–0.70)
Heparin Unfractionated: 0.12 IU/mL — ABNORMAL LOW (ref 0.30–0.70)

## 2020-07-22 LAB — BRAIN NATRIURETIC PEPTIDE: B Natriuretic Peptide: 84.4 pg/mL (ref 0.0–100.0)

## 2020-07-22 LAB — TROPONIN I (HIGH SENSITIVITY): Troponin I (High Sensitivity): 8 ng/L (ref <18)

## 2020-07-22 MED ORDER — HEPARIN BOLUS VIA INFUSION
2500.0000 [IU] | Freq: Once | INTRAVENOUS | Status: AC
  Administered 2020-07-22: 2500 [IU] via INTRAVENOUS
  Filled 2020-07-22: qty 2500

## 2020-07-22 MED ORDER — ENSURE ENLIVE PO LIQD
237.0000 mL | Freq: Two times a day (BID) | ORAL | Status: DC
  Administered 2020-07-23 – 2020-07-25 (×4): 237 mL via ORAL

## 2020-07-22 MED ORDER — PERFLUTREN LIPID MICROSPHERE
1.0000 mL | INTRAVENOUS | Status: AC | PRN
  Administered 2020-07-22: 2 mL via INTRAVENOUS
  Filled 2020-07-22: qty 10

## 2020-07-22 MED ORDER — HEPARIN (PORCINE) 25000 UT/250ML-% IV SOLN
1300.0000 [IU]/h | INTRAVENOUS | Status: DC
  Administered 2020-07-22: 1000 [IU]/h via INTRAVENOUS
  Filled 2020-07-22: qty 250

## 2020-07-22 MED ORDER — TRAMADOL HCL 50 MG PO TABS
50.0000 mg | ORAL_TABLET | Freq: Four times a day (QID) | ORAL | Status: DC | PRN
  Administered 2020-07-22 – 2020-07-23 (×3): 50 mg via ORAL
  Filled 2020-07-22 (×3): qty 1

## 2020-07-22 MED ORDER — HEPARIN (PORCINE) 25000 UT/250ML-% IV SOLN
2100.0000 [IU]/h | INTRAVENOUS | Status: DC
  Administered 2020-07-22: 1600 [IU]/h via INTRAVENOUS
  Administered 2020-07-23 (×2): 1900 [IU]/h via INTRAVENOUS
  Administered 2020-07-24: 2100 [IU]/h via INTRAVENOUS
  Filled 2020-07-22 (×2): qty 250
  Filled 2020-07-22: qty 500

## 2020-07-22 MED ORDER — HEPARIN BOLUS VIA INFUSION
2000.0000 [IU] | Freq: Once | INTRAVENOUS | Status: AC
  Administered 2020-07-22: 2000 [IU] via INTRAVENOUS
  Filled 2020-07-22: qty 2000

## 2020-07-22 NOTE — Progress Notes (Signed)
PROGRESS NOTE  BEYZA [name]  YSA:[id number] DOB: [birth year] DOA: 07/21/2020 PCP: [name], MD   Brief Narrative: [name] is a 80 y.o. female with a history of acquired hypogammaglobulinemia, anemia, asthma, emphysema, IDT2DM, stage IIIa CKD, dysphagia, fibromyalgia, chronic pain, GERD, hearing loss, rheumatoid arthritis, and OSA who presented to [facility] with dyspnea and chest pain found to have acute bilateral pulmonary emboli with hypoxia requiring 3L O2. Troponin negative though BP somewhat soft and RV/LV ratio by CTA was 1.3 consistent with right heart strain. Heparin infusion was started and the patient was transferred to [facility]. Echo is pending, though she remains hemodynamically stable. PT/OT consults also pending.  Assessment & Plan: Principal Problem:   Acute pulmonary embolism (HCC) Active Problems:   Asthma   Rheumatoid arthritis involving both feet with negative rheumatoid factor (HCC)   Type 2 diabetes mellitus without complication, without long-term current use of insulin (HCC)   Acute hypoxemic respiratory failure (HCC)  Acute hypoxic respiratory failure due to acute bilateral pulmonary emboli with right heart strain: RV/LV 1.3. Troponin wnl, BNP not elevated. - Wean oxygen as tolerated - Tylenol, toradol prn chest pain.  - Transition heparin to lovenox once we're confident no thrombolysis will be necessary, with plans for eventual DOAC - Follow up echocardiogram - If hypotension recurs would get PCCM involved and start stress dose steroids.   IDT2DM: HbA1c 8.1% - Plan to restart insulin once med rec verified  HLD:  - Continue statin, zetia  Fibromyalgia, RA:  - Continue duloxetine, gabapentin, chronic prednisone 5mg .   Asthma, emphysema: No exacerbation currently.  - Continue bronchodilators.  Iron deficiency anemia: No bleeding - Continue po iron  GERD:  - Continue PPI  RN Pressure Injury Documentation:    DVT prophylaxis: Heparin gtt Code Status:  DNR Family Communication: Daughter by phone Disposition Plan:  Status is: Inpatient  Remains inpatient appropriate because:Hemodynamically unstable and Ongoing diagnostic testing needed not appropriate for outpatient work up  Dispo: The patient is from: Home              Anticipated d/c is to: SNF              Patient currently is not medically stable to d/c.   Difficult to place patient No  Consultants:   None  Procedures:   None  Antimicrobials:  None   Subjective: Chest pain in lower left side with breathing is severe, intermittent, slightly improved with medications, associated with dyspnea on exertion. No new chest pain or other symptoms. No orhopnea.   Objective: Vitals:   07/21/20 2300 07/22/20 0000 07/22/20 0300 07/22/20 0723  BP: 121/67  (!) 117/45 102/76  Pulse: (!) 102  94 99  Resp: (!) 26  (!) 25 20  Temp:   98.5 F (36.9 C) 99.7 F (37.6 C)  TempSrc:   Axillary Oral  SpO2: 95%  96% 96%  Weight:  78.5 kg      Intake/Output Summary (Last 24 hours) at 07/22/2020 0945 Last data filed at 07/22/2020 0932 Gross per 24 hour  Intake 120 ml  Output 400 ml  Net -280 ml   Filed Weights   07/21/20 2200 07/22/20 0000  Weight: 77.8 kg 78.5 kg    Gen: 80 y.o. female in no distress Pulm: Non-labored breathing 2L O2. Clear to auscultation bilaterally.  CV: Regular rate and rhythm. No murmur, rub, or gallop. No JVD, no pitting pedal edema. GI: Abdomen soft, non-tender, non-distended, with normoactive bowel sounds. No organomegaly or  masses felt. Ext: Warm, dry. Hand deformities consistent with RA. Skin: No rashes, lesions or ulcers on visualized skin Neuro: Alert and oriented. No focal neurological deficits. Psych: Judgement and insight appear normal. Mood & affect appropriate.   Data Reviewed: I have personally reviewed following labs and imaging studies  HbA1C: Recent Labs    07/21/20 2341  HGBA1C 8.1*   CBG: Recent Labs  Lab 07/22/20 0105  07/22/20 0722  GLUCAP 146* 162*   Scheduled Meds: . insulin aspart  0-15 Units Subcutaneous TID WC  . insulin aspart  0-5 Units Subcutaneous QHS   Continuous Infusions: . heparin 1,300 Units/hr (07/22/20 0305)     LOS: 1 day   Time spent: 35 minutes.  Leslie Pour, MD [facility] [email or web address] 07/22/2020, 9:45 AM

## 2020-07-22 NOTE — Progress Notes (Signed)
On shift assessment pt febrile 101.1, BP 126/61, RR in high 30s, HR in mid 110s. SpO2 94% on 2.5 L via nasal cannula. On auscultation minimal wheezing to left upper lobe.  Mental status unchanged.  Pt denied chest pain and SOB. MD on call notified. Will continue heparin IV and administer Tylenol per MD.

## 2020-07-22 NOTE — Plan of Care (Signed)
  Problem: Clinical Measurements: Goal: Respiratory complications will improve Outcome: Progressing   Problem: Clinical Measurements: Goal: Cardiovascular complication will be avoided Outcome: Progressing   Problem: Coping: Goal: Level of anxiety will decrease Outcome: Progressing   

## 2020-07-22 NOTE — Progress Notes (Signed)
Initial Nutrition Assessment  DOCUMENTATION CODES:   Not applicable  INTERVENTION:  Provide Ensure Enlive po BID, each supplement provides 350 kcal and 20 grams of protein  Encourage adequate PO intake.   NUTRITION DIAGNOSIS:   Increased nutrient needs related to acute illness as evidenced by estimated needs.  GOAL:   Patient will meet greater than or equal to 90% of their needs  MONITOR:   PO intake,Supplement acceptance,Skin,Weight trends,Labs,I & O's  REASON FOR ASSESSMENT:   Malnutrition Screening Tool    ASSESSMENT:   80 y.o. female with a history of acquired hypogammaglobulinemia, anemia, asthma, emphysema, IDT2DM, stage IIIa CKD, dysphagia, fibromyalgia, chronic pain, GERD, hearing loss, rheumatoid arthritis, and OSA presents with dyspnea and chest pain found to have acute bilateral pulmonary emboli with hypoxia.   Pt unavailable during attempted time of contact. RD unable to obtain pt nutrition history at this time. Meal completion has been 75-100%. Per weight records, pt with a 8.6% weight loss in 2 month, significant for time frame. RD to order nutritional supplements to aid in caloric and protein needs. Unable to complete Nutrition-Focused physical exam at this time.   Labs and medications reviewed.   Diet Order:   Diet Order            Diet heart healthy/carb modified Room service appropriate? Yes; Fluid consistency: Thin  Diet effective now                 EDUCATION NEEDS:   Not appropriate for education at this time  Skin:  Skin Assessment: Reviewed RN Assessment  Last BM:  5/13  Height:   Ht Readings from Last 1 Encounters:  07/22/20 5' 5.98" (1.676 m)    Weight:   Wt Readings from Last 1 Encounters:  07/22/20 78.5 kg   BMI:  Body mass index is 27.95 kg/m.  Estimated Nutritional Needs:   Kcal:  1800-2000  Protein:  90-100 grams  Fluid:  >/= 1.8 L/day  Corrin Parker, MS, RD, LDN RD pager number/after hours weekend pager  number on Amion.

## 2020-07-22 NOTE — Progress Notes (Signed)
   07/22/20 1941  Assess: MEWS Score  Temp (!) 101.1 F (38.4 C)  BP 126/61  Pulse Rate (!) 117  ECG Heart Rate (!) 115  Resp (!) 23  Level of Consciousness Alert  SpO2 95 %  O2 Device Nasal Cannula  O2 Flow Rate (L/min) 2.5 L/min  Assess: MEWS Score  MEWS Temp 1  MEWS Systolic 0  MEWS Pulse 2  MEWS RR 1  MEWS LOC 0  MEWS Score 4  MEWS Score Color Red  Assess: if the MEWS score is Yellow or Red  Were vital signs taken at a resting state? Yes  Focused Assessment No change from prior assessment  Early Detection of Sepsis Score *See Row Information* Medium  MEWS guidelines implemented *See Row Information* Yes  Treat  MEWS Interventions Administered prn meds/treatments;Escalated (See documentation below);Consulted Respiratory Therapy  Take Vital Signs  Increase Vital Sign Frequency  Red: Q 1hr X 4 then Q 4hr X 4, if remains red, continue Q 4hrs  Escalate  MEWS: Escalate Red: discuss with charge nurse/RN and provider, consider discussing with RRT  Notify: Charge Nurse/RN  Name of Charge Nurse/RN Notified Katherine Roan, RN  Date Charge Nurse/RN Notified 07/22/20  Time Charge Nurse/RN Notified 2031  Notify: Provider  Provider Name/Title Nevada Crane, NP  Date Provider Notified 07/22/20  Time Provider Notified 2031  Notification Type Page  Notification Reason Other (Comment) (MEWS red is change from baseline)  Will continue to monitor pt closely, no change in assessment, no mental status change. Pt not in distress and denied SOB and chest pain.  No new interventions per Nevada Crane, MD.

## 2020-07-22 NOTE — Progress Notes (Signed)
  Echocardiogram 2D Echocardiogram WITH CONTRAST has been performed.  Merrie Roof F 07/22/2020, 11:55 AM

## 2020-07-22 NOTE — Evaluation (Signed)
Physical Therapy Evaluation Patient Details Name: [name] MRN: [id number] DOB: [birth year] Today's Date: 07/22/2020   History of Present Illness  [name] is a 80 y.o. female with medical history significant of acquired hypogammaglobulinemia, anemia, asthma, emphysema, insulin-dependent type 2 diabetes, CKD stage IIIa, dysphagia, fibromyalgia, chronic pain, GERD, hearing loss, rheumatoid arthritis, OSA transferred from [facility] ED for management of acute PE with CT evidence of right heart strain.  Clinical Impression  Patient received in bed, talking on phone. Patient reports she is just not feeling well and would like to put off PT, but is agreeable. She requires min assist for supine to sit due to reported neck pain with attempt to raise trunk. She has severe RA and has some difficulty using UEs for mobility assist. She uses motorized scooter at baseline for mobility. Patient was able to sit up on side of bed. O2 sats varying throughout session 80%s-100% on 2 lpm. Cues for pursed lip breathing while sitting. Patient declined further mobility this session.  Patient will continue to benefit from skilled PT while here to improve functional independence, and strength.         Follow Up Recommendations SNF    Equipment Recommendations  None recommended by PT;Other (comment) (none anticipated)    Recommendations for Other Services       Precautions / Restrictions Precautions Precautions: Fall Restrictions Weight Bearing Restrictions: No      Mobility  Bed Mobility Overal bed mobility: Needs Assistance Bed Mobility: Supine to Sit;Sit to Supine     Supine to sit: Min assist Sit to supine: Modified independent (Device/Increase time)   General bed mobility comments: patient required assist to raise trunk to seated position due to neck pain. Was able to go from sitting to supine without physical assist.    Transfers                 General transfer comment: not  attempted. Patient not feeling up to it. O2 saturations low.  Ambulation/Gait             General Gait Details: not attempted  Stairs            Wheelchair Mobility    Modified Rankin (Stroke Patients Only)       Balance Overall balance assessment: Needs assistance Sitting-balance support: Feet supported Sitting balance-Leahy Scale: Fair Sitting balance - Comments: unsteady at times with challenges to balance                                     Pertinent Vitals/Pain Pain Assessment: Faces Faces Pain Scale: Hurts even more Pain Location: neck and left side Pain Descriptors / Indicators: Discomfort;Sore Pain Intervention(s): Monitored during session;Repositioned    Home Living Family/patient expects to be discharged to:: Skilled nursing facility Living Arrangements: Alone                    Prior Function Level of Independence: Independent with assistive device(s)         Comments: son lives across the street, uses motorized WC, walks only very short distances with RW; independent with bathing/dressing, drives and uses motorized cart at grocery store     Hand Dominance   Dominant Hand: Right    Extremity/Trunk Assessment   Upper Extremity Assessment Upper Extremity Assessment: Defer to OT evaluation    Lower Extremity Assessment Lower Extremity Assessment: Generalized weakness;RLE deficits/detail;LLE deficits/detail  RLE Deficits / Details: multiple knee surgeries on right/RA LLE Deficits / Details: knee replacement on left/RA       Communication   Communication: HOH  Cognition Arousal/Alertness: Awake/alert Behavior During Therapy: Anxious;WFL for tasks assessed/performed Overall Cognitive Status: Within Functional Limits for tasks assessed                                        General Comments      Exercises     Assessment/Plan    PT Assessment Patient needs continued PT services  PT Problem  List Decreased strength;Decreased mobility;Decreased activity tolerance;Decreased balance;Pain       PT Treatment Interventions Therapeutic exercise;Gait training;Functional mobility training;Therapeutic activities;Patient/family education;Balance training    PT Goals (Current goals can be found in the Care Plan section)  Acute Rehab PT Goals Patient Stated Goal: to go to rehab, get stronger PT Goal Formulation: With patient Time For Goal Achievement: 08/05/20 Potential to Achieve Goals: Good    Frequency Min 3X/week   Barriers to discharge Decreased caregiver support      Co-evaluation               AM-PAC PT "6 Clicks" Mobility  Outcome Measure Help needed turning from your back to your side while in a flat bed without using bedrails?: A Little Help needed moving from lying on your back to sitting on the side of a flat bed without using bedrails?: A Little Help needed moving to and from a bed to a chair (including a wheelchair)?: A Lot Help needed standing up from a chair using your arms (e.g., wheelchair or bedside chair)?: A Little Help needed to walk in hospital room?: A Lot Help needed climbing 3-5 steps with a railing? : A Lot 6 Click Score: 15    End of Session Equipment Utilized During Treatment: Oxygen Activity Tolerance: Patient limited by fatigue;Other (comment);Patient limited by pain (SOB/O2 saturations) Patient left: in bed;with call bell/phone within reach;with bed alarm set Nurse Communication: Mobility status PT Visit Diagnosis: Other abnormalities of gait and mobility (R26.89);Muscle weakness (generalized) (M62.81);Difficulty in walking, not elsewhere classified (R26.2);Pain Pain - Right/Left: Left Pain - part of body:  (neck and left side)    Time: 1430-1445 PT Time Calculation (min) (ACUTE ONLY): 15 min   Charges:   PT Evaluation $PT Eval Moderate Complexity: 1 Mod          Razan Siler, PT, GCS 07/22/20,3:15 PM

## 2020-07-22 NOTE — Progress Notes (Signed)
BLE venous duplex has been completed.  Results can be found under chart review under CV PROC. 07/22/2020 3:26 PM Alois Colgan RVT, RDMS

## 2020-07-22 NOTE — Progress Notes (Addendum)
ANTICOAGULATION CONSULT NOTE - Initial Consult  Pharmacy Consult for Heparin Indication: pulmonary embolus  Patient Measurements: Weight: 78.5 kg (173 lb 1 oz) Heparin Dosing Weight: 75.4 kg   Vital Signs: Temp: 98 F (36.7 C) (05/14 1224) Temp Source: Oral (05/14 1224) BP: 116/60 (05/14 1224) Pulse Rate: 90 (05/14 1224)  Labs: Recent Labs    07/22/20 0104 07/22/20 0258 07/22/20 1124  HEPARINUNFRC <0.10*  --  0.11*  TROPONINIHS  --  8  --     CrCl cannot be calculated (Patient's most recent lab result is older than the maximum 21 days allowed.).   Medical History: Past Medical History:  Diagnosis Date  . Asthma   . Complication of anesthesia    Anesthesia told her years ago she got too cold during surgery  . Diabetes mellitus without complication ([place])   . Emphysema   . Fibromyalgia   . Osteoporosis   . Rheumatoid arthritis(714.0)   . Sleep apnea    no cpap    Assessment: 80 y.o. F presented to [place] with CP. CT shows acute b/l PE with evidence of RHS. Patient is not on anticoagulation PTA. Per RN - no issues with IV infusion or IV access in this patient and no signs of bleeding.  Goal of Therapy:  Heparin level 0.3-0.7 units/ml Monitor platelets by anticoagulation protocol: Yes   Plan:  RN aware of plan -- Rebolus heparin 2000 units and increase heparin gtt to 1600 units/hr. F/u 8hr heparin level at 2000 F/u CBC at 2000 F/u s/sx bleeding and transition to oral agents   Wilson Singer, PharmD PGY1 Pharmacy Resident 07/22/2020 12:43 PM

## 2020-07-22 NOTE — Progress Notes (Addendum)
ANTICOAGULATION CONSULT NOTE - Initial Consult  Pharmacy Consult for Heparin Indication: pulmonary embolus  Allergies  Allergen Reactions  . Penicillins Hives    Has patient had a PCN reaction causing immediate rash, facial/tongue/throat swelling, SOB or lightheadedness with hypotension: Yes Has patient had a PCN reaction causing severe rash involving mucus membranes or skin necrosis: No Has patient had a PCN reaction that required hospitalization: No Has patient had a PCN reaction occurring within the last 10 years: No If all of the above answers are "NO", then may proceed with Cephalosporin use. Patient has tolerated ceftriaxone in the recent past.  . Sulfamethoxazole-Trimethoprim Nausea And Vomiting    REACTION: stomach problems  . Meperidine Other (See Comments)  . Sulfa Antibiotics Nausea And Vomiting    REACTION: stomach problems  . Sulfonamide Derivatives Nausea And Vomiting    REACTION: stomach problems    Patient Measurements: Weight: 78.5 kg (173 lb 1 oz) Heparin Dosing Weight: TBW  Vital Signs:    Labs: No results for input(s): HGB, HCT, PLT, APTT, LABPROT, INR, HEPARINUNFRC, HEPRLOWMOCWT, CREATININE, CKTOTAL, CKMB, TROPONINIHS in the last 72 hours.  CrCl cannot be calculated (Patient's most recent lab result is older than the maximum 21 days allowed.).   Medical History: Past Medical History:  Diagnosis Date  . Asthma   . Complication of anesthesia    Anesthesia told her years ago she got too cold during surgery  . Diabetes mellitus without complication ([place])   . Emphysema   . Fibromyalgia   . Osteoporosis   . Rheumatoid arthritis(714.0)   . Sleep apnea    no cpap    Medications:  Awaiting home med rec  Assessment: 80 y.o. F presented to [place] with CP. CT shows acute b/l PE with evidence of RHS. Labs showing WBC 11.3, hemoglobin 11.6, hematocrit 37.3, platelet count 444K.  Sodium 135, potassium 4.7, chloride 104, bicarb 23, BUN 13, creatinine 0.8,  glucose 180.  INR 1.0.   Pt started on heparin 5000 unit bolus and gtt at 1000 units/hr ~1600 at [place]. To continue heparin upon admission here.  Goal of Therapy:  Heparin level 0.3-0.7 units/ml Monitor platelets by anticoagulation protocol: Yes   Plan:  Continue heparin gtt at 1000 units/hr Stat heparin level Daily heparin level and CBC  Sherlon Handing, PharmD, BCPS Please see amion for complete clinical pharmacist phone list 07/22/2020,12:35 AM   ADDENDUM: Heparin level undetectable on gtt at 1000 units/hr. No issues with line or bleeding reported per RN.  Plan:  Rebolus heparin 2500 units and increase heparin gtt to 1300 units/hr. F/u 8hr heparin level  Sherlon Handing, PharmD, BCPS Please see amion for complete clinical pharmacist phone list 07/22/2020 2:53 AM

## 2020-07-23 LAB — BASIC METABOLIC PANEL
Anion gap: 9 (ref 5–15)
BUN: 15 mg/dL (ref 8–23)
CO2: 20 mmol/L — ABNORMAL LOW (ref 22–32)
Calcium: 8.3 mg/dL — ABNORMAL LOW (ref 8.9–10.3)
Chloride: 106 mmol/L (ref 98–111)
Creatinine, Ser: 0.85 mg/dL (ref 0.44–1.00)
GFR, Estimated: >60 mL/min (ref >60)
Glucose, Bld: 148 mg/dL — ABNORMAL HIGH (ref 70–99)
Potassium: 4.2 mmol/L (ref 3.5–5.1)
Sodium: 135 mmol/L (ref 135–145)

## 2020-07-23 LAB — CBC
HCT: 33.7 % — ABNORMAL LOW (ref 36.0–46.0)
Hemoglobin: 9.8 g/dL — ABNORMAL LOW (ref 12.0–15.0)
MCH: 23.7 pg — ABNORMAL LOW (ref 26.0–34.0)
MCHC: 29.1 g/dL — ABNORMAL LOW (ref 30.0–36.0)
MCV: 81.6 fL (ref 80.0–100.0)
Platelets: 372 10*3/uL (ref 150–400)
RBC: 4.13 MIL/uL (ref 3.87–5.11)
RDW: 16.1 % — ABNORMAL HIGH (ref 11.5–15.5)
WBC: 11.4 10*3/uL — ABNORMAL HIGH (ref 4.0–10.5)
nRBC: 0 % (ref 0.0–0.2)

## 2020-07-23 LAB — GLUCOSE, CAPILLARY
Glucose-Capillary: 136 mg/dL — ABNORMAL HIGH (ref 70–99)
Glucose-Capillary: 120 mg/dL — ABNORMAL HIGH (ref 70–99)
Glucose-Capillary: 116 mg/dL — ABNORMAL HIGH (ref 70–99)
Glucose-Capillary: 142 mg/dL — ABNORMAL HIGH (ref 70–99)

## 2020-07-23 LAB — HEPARIN LEVEL (UNFRACTIONATED)
Heparin Unfractionated: 0.44 IU/mL (ref 0.30–0.70)
Heparin Unfractionated: 0.4 IU/mL (ref 0.30–0.70)

## 2020-07-23 MED ORDER — BUDESONIDE 0.25 MG/2ML IN SUSP
0.2500 mg | Freq: Two times a day (BID) | RESPIRATORY_TRACT | Status: DC
  Administered 2020-07-23 – 2020-07-26 (×8): 0.25 mg via RESPIRATORY_TRACT
  Filled 2020-07-23 (×8): qty 2

## 2020-07-23 MED ORDER — IPRATROPIUM-ALBUTEROL 0.5-2.5 (3) MG/3ML IN SOLN
3.0000 mL | Freq: Four times a day (QID) | RESPIRATORY_TRACT | Status: DC | PRN
  Administered 2020-07-23 (×2): 3 mL via RESPIRATORY_TRACT
  Filled 2020-07-23 (×2): qty 3

## 2020-07-23 MED ORDER — MIRABEGRON ER 25 MG PO TB24
50.0000 mg | ORAL_TABLET | Freq: Every day | ORAL | Status: DC
  Administered 2020-07-23 – 2020-07-26 (×4): 50 mg via ORAL
  Filled 2020-07-23 (×4): qty 2

## 2020-07-23 MED ORDER — HEPARIN BOLUS VIA INFUSION
2500.0000 [IU] | Freq: Once | INTRAVENOUS | Status: AC
  Administered 2020-07-23: 2500 [IU] via INTRAVENOUS
  Filled 2020-07-23: qty 2500

## 2020-07-23 MED ORDER — DIAZEPAM 2 MG PO TABS
2.0000 mg | ORAL_TABLET | Freq: Four times a day (QID) | ORAL | Status: DC | PRN
  Administered 2020-07-24 (×2): 2 mg via ORAL
  Filled 2020-07-23: qty 1

## 2020-07-23 MED ORDER — PREDNISONE 20 MG PO TABS
20.0000 mg | ORAL_TABLET | Freq: Every day | ORAL | Status: DC
  Administered 2020-07-23 – 2020-07-24 (×2): 20 mg via ORAL
  Filled 2020-07-23 (×2): qty 1

## 2020-07-23 MED ORDER — DULOXETINE HCL 60 MG PO CPEP
60.0000 mg | ORAL_CAPSULE | Freq: Every day | ORAL | Status: DC
  Administered 2020-07-23 – 2020-07-26 (×3): 60 mg via ORAL
  Filled 2020-07-23 (×4): qty 1

## 2020-07-23 MED ORDER — IPRATROPIUM-ALBUTEROL 0.5-2.5 (3) MG/3ML IN SOLN
3.0000 mL | Freq: Once | RESPIRATORY_TRACT | Status: AC
  Administered 2020-07-23: 3 mL via RESPIRATORY_TRACT
  Filled 2020-07-23: qty 3

## 2020-07-23 MED ORDER — ORAL CARE MOUTH RINSE
15.0000 mL | Freq: Two times a day (BID) | OROMUCOSAL | Status: DC
  Administered 2020-07-23 – 2020-07-26 (×7): 15 mL via OROMUCOSAL

## 2020-07-23 MED ORDER — OXYCODONE HCL 5 MG PO TABS
5.0000 mg | ORAL_TABLET | Freq: Four times a day (QID) | ORAL | Status: DC | PRN
  Administered 2020-07-23 – 2020-07-24 (×3): 10 mg via ORAL
  Administered 2020-07-24: 5 mg via ORAL
  Administered 2020-07-26: 10 mg via ORAL
  Filled 2020-07-23 (×4): qty 2
  Filled 2020-07-23: qty 1

## 2020-07-23 NOTE — Progress Notes (Addendum)
OT Cancellation Note  Patient Details Name: [name] MRN: [id number] DOB: Nov 14, 1940   Cancelled Treatment:    Reason Eval/Treat Not Completed: Patient not medically ready Upon OT arrival, pt with increased work of breath, stridor, and SpO2 84% on 3L/min via nasal canula, RR 30-33. Notified RN who arrived to provide pt with a breathing treatment. Pt continued to have increased WOB RR 28-31 audible wheezing, SpO2 94% on 3L/min via nasal canula, resting HR 100bpm-110bpm, BP 109/91mmHg. Pt also complaining of pain in her R elbow, noted elbow was swollen and warm to the touch, RN provided pt with some ice. RN recommended holding OT this date secondary to pt's status. OT will return tomorrow for comprehensive evaluation.   [facility] OTR/L Acute Rehabilitation Services Office: [place] 07/23/2020, 3:38 PM

## 2020-07-23 NOTE — Progress Notes (Signed)
PROGRESS NOTE  [name]  YSA:[id number] DOB: 1940/07/25 DOA: 07/21/2020 PCP: [name], MD   Brief Narrative: [name] is a 80 y.o. female with a history of acquired hypogammaglobulinemia, anemia, asthma, emphysema, IDT2DM, stage IIIa CKD, dysphagia, fibromyalgia, chronic pain, GERD, hearing loss, rheumatoid arthritis, and OSA who presented to [facility] with dyspnea and chest pain found to have acute bilateral pulmonary emboli with hypoxia requiring 3L O2. Troponin negative though BP somewhat soft and RV/LV ratio by CTA was 1.3 consistent with right heart strain. Heparin infusion was started and the patient was transferred to [facility]. Echo showed normal RV function, no DVT on LE U/S. Will require SNF at discharge per PT/OT.  Assessment & Plan: Principal Problem:   Acute pulmonary embolism (HCC) Active Problems:   Asthma   Rheumatoid arthritis involving both feet with negative rheumatoid factor (HCC)   Type 2 diabetes mellitus without complication, without long-term current use of insulin (HCC)   Acute hypoxemic respiratory failure (HCC)  Acute hypoxic respiratory failure due to acute bilateral pulmonary emboli with right heart strain: RV/LV 1.3. Troponin wnl, BNP not elevated. No RV strain on echo, LE U/S negative for DVT.  - Wean oxygen as tolerated - Tylenol, toradol prn chest pain. Will restart home oxycodone prn mod-severe pain as tramadol was ineffective. Pt is opioid tolerant.  - Bolus heparin, levels subtherapeutic. Transition heparin to lovenox once we're confident no thrombolysis will be necessary, with plans for eventual DOAC   IDT2DM: HbA1c 8.1% - Continue SSI, at goal.  HLD:  - Continue statin, zetia  Fibromyalgia, RA:  - Continue duloxetine, pt not taking gabapentin - Give prednisone 20mg  dose with borderline hypotension  Asthma, emphysema: No exacerbation currently.  - Continue home pulmicort - Add prn duonebs for wheezing/dyspnea. Wheezing currently. Give dose  now.  Iron deficiency anemia: No bleeding - Continue po iron  GERD:  - Continue PPI  DVT prophylaxis: Heparin gtt Code Status: DNR Family Communication: Daughter by phone 5/14. Disposition Plan:  Status is: Inpatient  Remains inpatient appropriate because:Hemodynamically unstable and Ongoing diagnostic testing needed not appropriate for outpatient work up  Dispo: The patient is from: Home              Anticipated d/c is to: SNF              Patient currently is not medically stable to d/c.   Difficult to place patient No  Consultants:   None  Procedures:   None  Antimicrobials:  None   Subjective: Chest pain is worse with some positions, mostly on the left, worse mostly with deep breaths. Dyspnea is present at rest, starting to notice some wheezing. Starting to get lower BP but asymptomatic from this.  Objective: Vitals:   07/23/20 0200 07/23/20 0349 07/23/20 0443 07/23/20 0749  BP:   (!) 110/39 (!) 109/50  Pulse: 89  89 (!) 101  Resp: (!) 24  (!) 21 20  Temp:  98.2 F (36.8 C) 97.6 F (36.4 C) 97.6 F (36.4 C)  TempSrc:  Oral Oral Oral  SpO2: 97%   98%  Weight:      Height:        Intake/Output Summary (Last 24 hours) at 07/23/2020 0835 Last data filed at 07/23/2020 0500 Gross per 24 hour  Intake 839.91 ml  Output 150 ml  Net 689.91 ml   Filed Weights   07/21/20 2200 07/22/20 0000 07/22/20 1224  Weight: 77.8 kg 78.5 kg 78.5 kg   Gen:  80 y.o. female in no distress Pulm: Nonlabored breathing room air. Wheezes, prolonged expiration. CV: Regular rate and rhythm. No murmur, rub, or gallop. No JVD, no pitting dependent edema. GI: Abdomen soft, non-tender, non-distended, with normoactive bowel sounds.  Ext: Warm, stable deformities. Skin: No new rashes, lesions or ulcers on visualized skin. Neuro: Alert and oriented. No focal neurological deficits. Psych: Judgement and insight appear fair. Mood euthymic & affect congruent. Behavior is appropriate.     Data Reviewed: I have personally reviewed following labs and imaging studies  HbA1C: Recent Labs    07/21/20 2341  HGBA1C 8.1*   CBG: Recent Labs  Lab 07/22/20 0105 07/22/20 0722 07/22/20 1201 07/22/20 1600 07/22/20 1939  GLUCAP 146* 162* 153* 157* 146*   Scheduled Meds: . budesonide  0.25 mg Nebulization BID  . DULoxetine  60 mg Oral QHS  . feeding supplement  237 mL Oral BID BM  . insulin aspart  0-15 Units Subcutaneous TID WC  . insulin aspart  0-5 Units Subcutaneous QHS  . mouth rinse  15 mL Mouth Rinse BID  . mirabegron ER  50 mg Oral QHS  . predniSONE  20 mg Oral See admin instructions   Continuous Infusions: . heparin 1,900 Units/hr (07/23/20 0814)     LOS: 2 days   Time spent: 35 minutes.  [name], MD [facility] [email or web address] 07/23/2020, 8:35 AM

## 2020-07-23 NOTE — NC FL2 (Signed)
[place] LEVEL OF CARE SCREENING TOOL     IDENTIFICATION  Patient Name: [name] Birthdate: 1940-09-04 Sex: female Admission Date (Current Location): 07/21/2020  [facility] and [place] Number:  Herbalist and Address:  The . [facility], [place] [address], [place], [place] [address]      Provider Number: [id number]  Attending Physician Name and Address:  [name], MD  Relative Name and Phone Number:  Quentin Ore, [phone]    Current Level of Care: Hospital Recommended Level of Care: [place] Prior Approval Number:    Date Approved/Denied:   PASRR Number: [id number]  Discharge Plan: SNF    Current Diagnoses: Patient Active Problem List   Diagnosis Date Noted  . Acute pulmonary embolism ([place]) 07/21/2020  . Hypoxia 05/23/2020  . Acute hypoxemic respiratory failure ([place]) 05/22/2020  . Rotator cuff arthropathy of left shoulder 12/13/2019  . Pain in joint of right shoulder 12/10/2019  . Pain in joint of left shoulder 12/10/2019  . Hearing loss, sensorineural, 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  . Tinnitus, left 01/08/2019  . DM type 2, not at goal [facility]) 06/03/2018  . Frequent infections 05/21/2018  . Gangrene ([place]) 05/21/2018  . Pain in finger 05/07/2018  . Family history of aneurysm 05/07/2018  . Diabetes mellitus due to underlying condition, controlled, with complication, without long-term current use of insulin ([place])   . Lower urinary tract infectious disease   . Palpitations 01/17/2018  . Dehydration 01/17/2018  . Hypotension 01/17/2018  . Symptom of blood in stool 01/17/2018  . Diarrhea in adult patient 01/17/2018  . 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  . Aftercare following joint replacement 08/21/2017  . 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 Closed displaced transverse fracture of right  patella 05/06/2017  . Anemia 04/16/2017  . Chronic interstitial lung disease ([place]) 04/16/2017  . Chronic kidney disease (CKD), stage III (moderate) ([place]) 04/16/2017  . Other hyperlipidemia 04/16/2017  . Type 2 diabetes mellitus without complication, without long-term current use of insulin ([place]) 04/16/2017  . Pain in right knee 03/25/2017  . Aftercare 03/25/2017  . Arthritis, septic, knee ([place]) 03/09/2017  . 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[place]) 06/04/2016  . Fibromyalgia 06/04/2016  . High risk medication use 06/04/2016  . Claw toe, acquired, left 01/29/2016  . Rheumatoid arthritis involving both feet with negative rheumatoid factor ([place]) 01/29/2016  . Septic prepatellar bursitis of right knee 10/26/2014  . Prepatellar bursitis of right knee 08/28/2014  . DYSPHAGIA ORAL PHASE 06/18/2007  . Asthma 05/14/2007  . SLEEP APNEA 05/14/2007  . Sleep apnea 05/14/2007  . GERD 05/13/2007  . Rheumatoid arthritis ([employer]) 05/13/2007  . OSTEOPOROSIS 05/13/2007  . DIARRHEA, CHRONIC 05/13/2007    Orientation RESPIRATION BLADDER Height & Weight     Self,Time,Situation,Place  O2 ([place] 3) Incontinent Weight: 173 lb 1 oz (78.5 kg) Height:  5' 5.98" (167.6 cm)  BEHAVIORAL SYMPTOMS/MOOD NEUROLOGICAL BOWEL NUTRITION STATUS      Incontinent Diet (See DC summary)  AMBULATORY STATUS COMMUNICATION OF NEEDS Skin   Extensive Assist Verbally Normal                       Personal Care Assistance Level of Assistance  Bathing,Feeding,Dressing Bathing Assistance: Limited assistance Feeding assistance: Independent Dressing Assistance: Limited assistance     Functional Limitations Info  Sight,Hearing,Speech Sight Info: Adequate Hearing Info: Impaired Speech Info: Adequate  SPECIAL CARE FACTORS FREQUENCY  PT (By licensed PT),OT (By licensed OT)     PT Frequency: 5x week OT Frequency: 5x week            Contractures      Additional Factors Info  Code Status,Allergies,Psychotropic,Insulin  Sliding Scale Code Status Info: DNR Allergies Info: Penicillins   Sulfamethoxazole-trimethoprim   Meperidine   Meperidine Hcl   Penicillin G Sodium   Sulfa Antibiotics   Sulfonamide Derivatives Psychotropic Info: Duloxetine (Cymbalta Insulin Sliding Scale Info: Insulin Aspart (novolog) 0-15 U 3x daily w/ meals; 0-5 U @ bedtime       Current Medications (07/23/2020):  This is the current hospital active medication list Current Facility-Administered Medications  Medication Dose Route Frequency Provider Last Rate Last Admin  . acetaminophen (TYLENOL) tablet 650 mg  650 mg Oral Q6H PRN Shela Leff, MD   650 mg at 07/23/20 0342   Or  . acetaminophen (TYLENOL) suppository 650 mg  650 mg Rectal Q6H PRN Shela Leff, MD      . budesonide (PULMICORT) nebulizer solution 0.25 mg  0.25 mg Nebulization BID Vance Gather B, MD   0.25 mg at 07/23/20 0857  . DULoxetine (CYMBALTA) DR capsule 60 mg  60 mg Oral QHS Vance Gather B, MD      . feeding supplement (ENSURE ENLIVE / ENSURE PLUS) liquid 237 mL  237 mL Oral BID BM Vance Gather B, MD   237 mL at 07/23/20 1142  . heparin ADULT infusion 100 units/mL (25000 units/235mL)  1,900 Units/hr Intravenous Continuous Wilson Singer I, RPH 19 mL/hr at 07/23/20 0814 1,900 Units/hr at 07/23/20 0814  . insulin aspart (novoLOG) injection 0-15 Units  0-15 Units Subcutaneous TID WC Shela Leff, MD   2 Units at 07/23/20 0850  . insulin aspart (novoLOG) injection 0-5 Units  0-5 Units Subcutaneous QHS Shela Leff, MD      . ipratropium-albuterol (DUONEB) 0.5-2.5 (3) MG/3ML nebulizer solution 3 mL  3 mL Nebulization Q6H PRN [name], MD   3 mL at 07/23/20 1504  . MEDLINE mouth rinse  15 mL Mouth Rinse BID [name], MD   15 mL at 07/23/20 0841  . mirabegron ER (MYRBETRIQ) tablet 50 mg  50 mg Oral QHS Vance Gather B, MD      . oxyCODONE (Oxy IR/ROXICODONE) immediate release tablet 5-10 mg  5-10 mg Oral Q6H PRN [name], MD   10 mg at  07/23/20 0849  . predniSONE (DELTASONE) tablet 20 mg  20 mg Oral Daily [name], MD   20 mg at 07/23/20 7062     Discharge Medications: Please see discharge summary for a list of discharge medications.  Relevant Imaging Results:  Relevant Lab Results:   Additional Information SSN [id number]  Coralee Pesa, LCSWA

## 2020-07-23 NOTE — Progress Notes (Addendum)
ANTICOAGULATION CONSULT NOTE - Initial Consult  Pharmacy Consult for Heparin Indication: pulmonary embolus  Patient Measurements: Height: 5' 5.98" (167.6 cm) Weight: 78.5 kg (173 lb 1 oz) IBW/kg (Calculated) : 59.26 Heparin Dosing Weight: 75.4 kg   Vital Signs: Temp: 99.6 F (37.6 C) (05/15 1159) Temp Source: Oral (05/15 1159) BP: 112/67 (05/15 1159) Pulse Rate: 102 (05/15 1159)  Labs: Recent Labs    07/22/20 0258 07/22/20 1124 07/22/20 1959 07/23/20 0348 07/23/20 1352  HGB  --   --  9.6* 9.8*  --   HCT  --   --  31.7* 33.7*  --   PLT  --   --  406* 372  --   HEPARINUNFRC  --  0.11* 0.12*  --  0.44  CREATININE  --   --   --  0.85  --   TROPONINIHS 8  --   --   --   --     Estimated Creatinine Clearance: 57.7 mL/min (by C-G formula based on SCr of 0.85 mg/dL).   Medical History: Past Medical History:  Diagnosis Date  . Asthma   . Complication of anesthesia    Anesthesia told her years ago she got too cold during surgery  . Diabetes mellitus without complication ([place])   . Emphysema   . Fibromyalgia   . Osteoporosis   . Rheumatoid arthritis(714.0)   . Sleep apnea    no cpap    Assessment: 80 y.o. F presented to [place] with CP. CT shows acute b/l PE with evidence of RHS. Patient is not on anticoagulation PTA. Per RN - no issues with IV infusion or IV access in this patient and no signs of bleeding.  CBC Hgb 9.8, Plt 372 HL 0.44 (therapeutic)  Goal of Therapy:  Heparin level 0.3-0.7 units/ml Monitor platelets by anticoagulation protocol: Yes   Plan:  Continue heparin at current rate F/u confirmatory level in 6hrs at 2100 F/u s/sx bleeding and transition to enoxaparin/oral agents   Wilson Singer, PharmD PGY1 Pharmacy Resident 07/23/2020 2:48 PM

## 2020-07-23 NOTE — Progress Notes (Signed)
ANTICOAGULATION CONSULT NOTE - Initial Consult  Pharmacy Consult for Heparin Indication: pulmonary embolus  Patient Measurements: Height: 5' 5.98" (167.6 cm) Weight: 78.5 kg (173 lb 1 oz) IBW/kg (Calculated) : 59.26 Heparin Dosing Weight: 75.4 kg   Vital Signs: Temp: 97.6 F (36.4 C) (05/15 0443) Temp Source: Oral (05/15 0443) BP: 110/39 (05/15 0443) Pulse Rate: 89 (05/15 0443)  Labs: Recent Labs    07/22/20 0104 07/22/20 0258 07/22/20 1124 07/22/20 1959 07/23/20 0348  HGB  --   --   --  9.6* 9.8*  HCT  --   --   --  31.7* 33.7*  PLT  --   --   --  406* 372  HEPARINUNFRC <0.10*  --  0.11* 0.12*  --   CREATININE  --   --   --   --  0.85  TROPONINIHS  --  8  --   --   --     Estimated Creatinine Clearance: 57.7 mL/min (by C-G formula based on SCr of 0.85 mg/dL).   Medical History: Past Medical History:  Diagnosis Date  . Asthma   . Complication of anesthesia    Anesthesia told her years ago she got too cold during surgery  . Diabetes mellitus without complication ([place])   . Emphysema   . Fibromyalgia   . Osteoporosis   . Rheumatoid arthritis(714.0)   . Sleep apnea    no cpap    Assessment: 80 y.o. F presented to [facility] with CP. CT shows acute b/l PE with evidence of RHS. Patient is not on anticoagulation PTA. Per RN - no issues with IV infusion or IV access in this patient and no signs of bleeding.  CBC Hgb 9.8, Plt 372 HL 0.12 (low)  Goal of Therapy:  Heparin level 0.3-0.7 units/ml Monitor platelets by anticoagulation protocol: Yes   Plan:  RN aware of plan -- Rebolus heparin 2500 units and increase heparin gtt to 1900 units/hr. F/u 6hr heparin level at 1400 F/u s/sx bleeding and transition to oral agents   Wilson Singer, PharmD PGY1 Pharmacy Resident 07/23/2020 7:23 AM

## 2020-07-23 NOTE — Progress Notes (Signed)
ANTICOAGULATION CONSULT NOTE - Follow up [place] for Heparin Indication: pulmonary embolus  Patient Measurements: Height: 5' 5.98" (167.6 cm) Weight: 78.5 kg (173 lb 1 oz) IBW/kg (Calculated) : 59.26 Heparin Dosing Weight: 75.4 kg   Vital Signs: Temp: 98 F (36.7 C) (05/15 2000) Temp Source: Oral (05/15 2000) BP: 107/45 (05/15 2000) Pulse Rate: 110 (05/15 1551)  Labs: Recent Labs    07/22/20 0258 07/22/20 1124 07/22/20 1959 07/23/20 0348 07/23/20 1352 07/23/20 2056  HGB  --   --  9.6* 9.8*  --   --   HCT  --   --  31.7* 33.7*  --   --   PLT  --   --  406* 372  --   --   HEPARINUNFRC  --    < > 0.12*  --  0.44 0.40  CREATININE  --   --   --  0.85  --   --   TROPONINIHS 8  --   --   --   --   --    < > = values in this interval not displayed.    Estimated Creatinine Clearance: 57.7 mL/min (by C-G formula based on SCr of 0.85 mg/dL).   Medical History: Past Medical History:  Diagnosis Date  . Asthma   . Complication of anesthesia    Anesthesia told her years ago she got too cold during surgery  . Diabetes mellitus without complication ([place])   . Emphysema   . Fibromyalgia   . Osteoporosis   . Rheumatoid arthritis(714.0)   . Sleep apnea    no cpap    Assessment: 80 y.o. F presented to [place] with CP. CT shows acute b/l PE with evidence of RHS. Patient is not on anticoagulation PTA. Per RN - no issues with IV infusion or IV access in this patient and no signs of bleeding. CBC Hgb 9.8, Plt 372 Heparin drip 1900 uts/hr HL 0.4 at goal   Goal of Therapy:  Heparin level 0.3-0.7 units/ml Monitor platelets by anticoagulation protocol: Yes   Plan:  Continue heparin at current rate 1900 uts/hr  F/u s/sx bleeding and transition to enoxaparin/oral agents    Bonnita Nasuti Pharm.D. CPP, BCPS Clinical Pharmacist [phone] 07/23/2020 9:36 PM

## 2020-07-23 NOTE — Progress Notes (Signed)
MD notified of patient's wheezing and tachypnea. Orders received.

## 2020-07-24 ENCOUNTER — Inpatient Hospital Stay (HOSPITAL_COMMUNITY): Payer: Medicare Other

## 2020-07-24 LAB — BASIC METABOLIC PANEL
Anion gap: 9 (ref 5–15)
Anion gap: 8 (ref 5–15)
BUN: 19 mg/dL (ref 8–23)
BUN: 22 mg/dL (ref 8–23)
CO2: 20 mmol/L — ABNORMAL LOW (ref 22–32)
CO2: 21 mmol/L — ABNORMAL LOW (ref 22–32)
Calcium: 8.1 mg/dL — ABNORMAL LOW (ref 8.9–10.3)
Calcium: 7.8 mg/dL — ABNORMAL LOW (ref 8.9–10.3)
Chloride: 105 mmol/L (ref 98–111)
Chloride: 104 mmol/L (ref 98–111)
Creatinine, Ser: 1.31 mg/dL — ABNORMAL HIGH (ref 0.44–1.00)
Creatinine, Ser: 1.05 mg/dL — ABNORMAL HIGH (ref 0.44–1.00)
GFR, Estimated: 42 mL/min — ABNORMAL LOW (ref >60)
GFR, Estimated: 54 mL/min — ABNORMAL LOW (ref >60)
Glucose, Bld: 128 mg/dL — ABNORMAL HIGH (ref 70–99)
Glucose, Bld: 232 mg/dL — ABNORMAL HIGH (ref 70–99)
Potassium: 4.7 mmol/L (ref 3.5–5.1)
Potassium: 5.4 mmol/L — ABNORMAL HIGH (ref 3.5–5.1)
Sodium: 134 mmol/L — ABNORMAL LOW (ref 135–145)
Sodium: 133 mmol/L — ABNORMAL LOW (ref 135–145)

## 2020-07-24 LAB — URINALYSIS, ROUTINE W REFLEX MICROSCOPIC
Bilirubin Urine: NEGATIVE
Glucose, UA: NEGATIVE mg/dL
Hgb urine dipstick: NEGATIVE
Ketones, ur: 5 mg/dL — AB
Leukocytes,Ua: NEGATIVE
Nitrite: NEGATIVE
Protein, ur: 30 mg/dL — AB
Specific Gravity, Urine: 1.03 (ref 1.005–1.030)
pH: 5 (ref 5.0–8.0)

## 2020-07-24 LAB — CBC
HCT: 31.8 % — ABNORMAL LOW (ref 36.0–46.0)
Hemoglobin: 9.5 g/dL — ABNORMAL LOW (ref 12.0–15.0)
MCH: 24.2 pg — ABNORMAL LOW (ref 26.0–34.0)
MCHC: 29.9 g/dL — ABNORMAL LOW (ref 30.0–36.0)
MCV: 81.1 fL (ref 80.0–100.0)
Platelets: 397 10*3/uL (ref 150–400)
RBC: 3.92 MIL/uL (ref 3.87–5.11)
RDW: 16.2 % — ABNORMAL HIGH (ref 11.5–15.5)
WBC: 15.1 10*3/uL — ABNORMAL HIGH (ref 4.0–10.5)
nRBC: 0 % (ref 0.0–0.2)

## 2020-07-24 LAB — GLUCOSE, CAPILLARY
Glucose-Capillary: 203 mg/dL — ABNORMAL HIGH (ref 70–99)
Glucose-Capillary: 163 mg/dL — ABNORMAL HIGH (ref 70–99)
Glucose-Capillary: 224 mg/dL — ABNORMAL HIGH (ref 70–99)
Glucose-Capillary: 210 mg/dL — ABNORMAL HIGH (ref 70–99)
Glucose-Capillary: 234 mg/dL — ABNORMAL HIGH (ref 70–99)

## 2020-07-24 LAB — SODIUM, URINE, RANDOM: Sodium, Ur: 21 mmol/L

## 2020-07-24 LAB — TROPONIN I (HIGH SENSITIVITY): Troponin I (High Sensitivity): 13 ng/L (ref <18)

## 2020-07-24 LAB — PROTEIN / CREATININE RATIO, URINE
Creatinine, Urine: 447.33 mg/dL
Protein Creatinine Ratio: 0.17 mg/mg{Cre} — ABNORMAL HIGH (ref 0.00–0.15)
Total Protein, Urine: 74 mg/dL

## 2020-07-24 LAB — HEPARIN LEVEL (UNFRACTIONATED)
Heparin Unfractionated: 0.26 IU/mL — ABNORMAL LOW (ref 0.30–0.70)
Heparin Unfractionated: 0.16 IU/mL — ABNORMAL LOW (ref 0.30–0.70)

## 2020-07-24 MED ORDER — SODIUM ZIRCONIUM CYCLOSILICATE 10 G PO PACK
10.0000 g | PACK | Freq: Once | ORAL | Status: AC
  Administered 2020-07-24: 10 g via ORAL
  Filled 2020-07-24: qty 1

## 2020-07-24 MED ORDER — SODIUM CHLORIDE 0.9 % IV SOLN: INTRAVENOUS | Status: AC

## 2020-07-24 MED ORDER — PREDNISONE 20 MG PO TABS
40.0000 mg | ORAL_TABLET | Freq: Every day | ORAL | Status: DC
  Administered 2020-07-24 – 2020-07-26 (×3): 40 mg via ORAL
  Filled 2020-07-24 (×4): qty 2

## 2020-07-24 MED ORDER — ENOXAPARIN SODIUM 80 MG/0.8ML IJ SOSY
80.0000 mg | PREFILLED_SYRINGE | Freq: Once | INTRAMUSCULAR | Status: AC
  Administered 2020-07-24: 80 mg via SUBCUTANEOUS
  Filled 2020-07-24: qty 0.8

## 2020-07-24 MED ORDER — ENOXAPARIN SODIUM 80 MG/0.8ML IJ SOSY
80.0000 mg | PREFILLED_SYRINGE | Freq: Two times a day (BID) | INTRAMUSCULAR | Status: DC
  Administered 2020-07-25 – 2020-07-26 (×3): 80 mg via SUBCUTANEOUS
  Filled 2020-07-24 (×4): qty 0.8

## 2020-07-24 NOTE — Progress Notes (Signed)
Patient had a brief altered mental status with mild confusion that was later followed by baseline mental status.  She was seen and examined.  She denied any headache or dizziness or blurred vision.  No chest pain or palpitations.  No paresthesias or focal muscle weakness.  During my interview she was A&O x4.  Right pupil slightly larger than left pupil.  CV, RRR, normal S1-S2, no M/G/R.  Lungs were clear to auscultation bilaterally.  Abdomen soft nontender.  Extremities with no edema.  Neurologically: Cranial nerves II through XII grossly intact.  Normal muscle strength in both upper and lower extremities 5/5.  Normal sensory exam to light touch.  She was alert and oriented x3.  Stat head CT scan came back negative for acute cardiopulmonary disease.  Blood glucose was 210.  EKG showed sinus rhythm with a rate of 82 with PACs LAFB and minimal voltage criteria for LVH with same diffuse have millimeter-1 mm ST segment elevation in anterolateral and inferior leads.  We will continue monitoring her.

## 2020-07-24 NOTE — Care Management Important Message (Signed)
Important Message  Patient Details  Name: [name] MRN: [id number] Date of Birth: 08/23/1940   Medicare Important Message Given:  Yes     [name] 07/24/2020, 2:57 PM

## 2020-07-24 NOTE — Evaluation (Signed)
Occupational Therapy Evaluation Patient Details Name: [name] MRN: [id number] DOB: 18-Oct-1940 Today's Date: 07/24/2020    History of Present Illness [name] is a 80 y.o. female with medical history significant of acquired hypogammaglobulinemia, anemia, asthma, emphysema, insulin-dependent type 2 diabetes, CKD stage IIIa, dysphagia, fibromyalgia, chronic pain, GERD, hearing loss, rheumatoid arthritis, OSA transferred from [facility] ED for management of acute PE with CT evidence of right heart strain.   Clinical Impression   PTA, pt was living at home alone, pt reports she was independent with ADL/IADL and mobility, pt was driving and grocery shopping with use of power shopping cart. Pt currently requires minguard for bed mobility, she was unable to progress into standing despite maxA from therapist. While sitting EOB pt had increased wob, RR 33, spO2 89% on 2lnc. Pt ST-II 1.56mm-2.2mm while sitting EOB, notified RN. Due to decline in current level of function, pt would benefit from acute OT to address established goals to facilitate safe D/C to venue listed below. At this time, recommend SNF follow-up. Will continue to follow acutely.     Follow Up Recommendations  SNF;Supervision/Assistance - 24 hour    Equipment Recommendations  3 in 1 bedside commode    Recommendations for Other Services       Precautions / Restrictions Precautions Precautions: Fall Restrictions Weight Bearing Restrictions: No      Mobility Bed Mobility Overal bed mobility: Needs Assistance Bed Mobility: Supine to Sit     Supine to sit: Min guard Sit to supine: Supervision   General bed mobility comments: hob elevated, pt with increased effort and time to progress to EOB initial assistance to remove covers from pt's feet to progress BLE    Transfers Overall transfer level: Needs assistance Equipment used: Rolling walker (2 wheeled) Transfers: Stand Pivot Transfers   Stand pivot  transfers: Min assist       General transfer comment: attempted sit<>stand but unable despite maxA support from therapist. Pt with then increased wob RR 33 and SpO2 89% noted pt's ST-II 2.0-2.3 at this time and deferred further mobilityRN notified    Balance Overall balance assessment: Needs assistance Sitting-balance support: Feet supported Sitting balance-Leahy Scale: Fair Sitting balance - Comments: unsteady at times with challenges to balance   Standing balance support: Bilateral upper extremity supported Standing balance-Leahy Scale: Poor                             ADL either performed or assessed with clinical judgement   ADL Overall ADL's : Needs assistance/impaired Eating/Feeding: Set up;Sitting   Grooming: Set up;Sitting   Upper Body Bathing: Minimal assistance;Sitting   Lower Body Bathing: Moderate assistance;Sit to/from stand   Upper Body Dressing : Minimal assistance;Sitting   Lower Body Dressing: Moderate assistance;Sit to/from Health and safety inspector Details (indicate cue type and reason): deferred, attempted to stand from EOB but pt unable at this time despite therapist providing maxA           General ADL Comments: pt limited to sitting EOB secondary to BLE weakness, increased work of breath RR 33, spO2 89% pt able to rebound to 92% with pursed lip breathing HR 110s sitting EOB     Vision         Perception     Praxis      Pertinent Vitals/Pain Pain Assessment: Faces Faces Pain Scale: Hurts a little bit Pain Location: mid back Pain Descriptors /  Indicators: Discomfort;Sore Pain Intervention(s): Monitored during session;Limited activity within patient's tolerance     Hand Dominance Right   Extremity/Trunk Assessment Upper Extremity Assessment Upper Extremity Assessment: Generalized weakness;RUE deficits/detail;LUE deficits/detail RUE Deficits / Details: hands contracted with MCP in extreme flexion digits 2-5, pt with poor  functional use of bilateral hands LUE Deficits / Details: hands contracted with MCP in extreme flexion digits 2-5, pt with poor functional use of bilateral hands   Lower Extremity Assessment Lower Extremity Assessment: Defer to PT evaluation RLE Deficits / Details: multiple knee surgeries on right/RA LLE Deficits / Details: knee replacement on left/RA   Cervical / Trunk Assessment Cervical / Trunk Assessment: Kyphotic   Communication Communication Communication: HOH   Cognition Arousal/Alertness: Awake/alert Behavior During Therapy: Anxious;WFL for tasks assessed/performed Overall Cognitive Status: No family/caregiver present to determine baseline cognitive functioning                                 General Comments: Pt follows commands consistently, is easily distractable, otherwise WFL   General Comments  HR 110s, sats 97% on 2L    Exercises     Shoulder Instructions      Home Living Family/patient expects to be discharged to:: Skilled nursing facility Living Arrangements: Alone                                      Prior Functioning/Environment Level of Independence: Independent with assistive device(s)        Comments: son lives across the street, uses motorized [facility], walks only very short distances with RW; independent with bathing/dressing, drives and uses motorized cart at grocery store        OT Problem List: Decreased strength;Decreased range of motion;Decreased activity tolerance;Impaired balance (sitting and/or standing);Cardiopulmonary status limiting activity;Pain;Impaired UE functional use      OT Treatment/Interventions: Self-care/ADL training;Therapeutic exercise;Energy conservation;DME and/or AE instruction;Therapeutic activities;Patient/family education;Balance training    OT Goals(Current goals can be found in the care plan section) Acute Rehab OT Goals Patient Stated Goal: to go to rehab, get stronger OT Goal  Formulation: With patient Time For Goal Achievement: 08/07/20 Potential to Achieve Goals: Good ADL Goals Pt Will Perform Lower Body Dressing: with modified independence;sit to/from stand Pt Will Transfer to Toilet: with modified independence;stand pivot transfer Additional ADL Goal #1: Pt will complete bed mobility with modified independence in preparation for ADL/IADL and mobility.  OT Frequency: Min 2X/week   Barriers to D/C:            Co-evaluation              AM-PAC OT "6 Clicks" Daily Activity     Outcome Measure Help from another person eating meals?: A Little Help from another person taking care of personal grooming?: A Little Help from another person toileting, which includes using toliet, bedpan, or urinal?: A Lot Help from another person bathing (including washing, rinsing, drying)?: A Lot Help from another person to put on and taking off regular upper body clothing?: A Little Help from another person to put on and taking off regular lower body clothing?: A Lot 6 Click Score: 15   End of Session Equipment Utilized During Treatment: Oxygen Nurse Communication: Mobility status  Activity Tolerance: Patient tolerated treatment well Patient left: in bed;with call bell/phone within reach;with bed alarm set;with family/visitor present  OT Visit Diagnosis:  Unsteadiness on feet (R26.81);Other abnormalities of gait and mobility (R26.89);Muscle weakness (generalized) (M62.81);Other symptoms and signs involving cognitive function;Pain Pain - part of body:  (back pain)                Time: 1240-1317 OT Time Calculation (min): 37 min Charges:  OT General Charges $OT Visit: 1 Visit OT Evaluation $OT Eval Moderate Complexity: 1 Mod OT Treatments $Self Care/Home Management : 8-22 mins  Helene Kelp OTR/L Acute Rehabilitation Services Office: [place] 07/24/2020, 4:00 PM

## 2020-07-24 NOTE — Progress Notes (Signed)
ANTICOAGULATION CONSULT NOTE - Follow Up Consult  Pharmacy Consult for heparin Indication: pulmonary embolus  Labs: Recent Labs    07/22/20 0258 07/22/20 1124 07/22/20 1959 07/23/20 0348 07/23/20 1352 07/23/20 2056 07/24/20 0225  HGB  --    < > 9.6* 9.8*  --   --  9.5*  HCT  --   --  31.7* 33.7*  --   --  31.8*  PLT  --   --  406* 372  --   --  397  HEPARINUNFRC  --    < > 0.12*  --  0.44 0.40 0.26*  CREATININE  --   --   --  0.85  --   --  1.31*  TROPONINIHS 8  --   --   --   --   --   --    < > = values in this interval not displayed.    Assessment: 80yo female subtherapeutic on heparin after two levels at goal; no gtt issues or signs of bleeding per RN though he does note that pt has been bending the arm where the heparin is running into [facility], thus causing some line occlusions.  Goal of Therapy:  Heparin level 0.3-0.7 units/ml   Plan:  Will increase heparin gtt by 10% to 2100 units/hr and check level in 8 hours; RN to attempt to place a new IV.    Wynona Neat, PharmD, BCPS  07/24/2020,3:32 AM

## 2020-07-24 NOTE — Progress Notes (Signed)
Physical Therapy Treatment Patient Details Name: MACEE VENABLES MRN: [id number] DOB: [birth year] Today's Date: 07/24/2020    History of Present Illness MARIXA MELLOTT is a 80 y.o. female with medical history significant of acquired hypogammaglobulinemia, anemia, asthma, emphysema, insulin-dependent type 2 diabetes, CKD stage IIIa, dysphagia, fibromyalgia, chronic pain, GERD, hearing loss, rheumatoid arthritis, OSA transferred from [facility] ED for management of acute PE with CT evidence of right heart strain.    PT Comments    Patient eager to attempt OOB to chair and required only min assist to do so. She is accustomed to pulling on her power chair to come to stand and pivot, therefore PT anchored RW while pt pulled herself to stand. Pt then able to pivot holding onto and maneuvering RW with minguard assist. She agrees she needs time with therapy to improve her strength and safety with mobility.     Follow Up Recommendations  SNF     Equipment Recommendations  None recommended by PT;Other (comment) (none anticipated)    Recommendations for Other Services       Precautions / Restrictions Precautions Precautions: Fall    Mobility  Bed Mobility Overal bed mobility: Needs Assistance Bed Mobility: Supine to Sit     Supine to sit: Supervision     General bed mobility comments: HOB elevated but no physical assist needed, just incr time and effort    Transfers Overall transfer level: Needs assistance Equipment used: Rolling walker (2 wheeled) Transfers: Stand Pivot Transfers   Stand pivot transfers: Min assist       General transfer comment: pt reports she usually pulls on her wheelchair to come to stand from her recliner; allowed pt to pull on RW and she stood herself (except for min assist to anchor RW); pivoted with min guard and was able to advance RW herself  Ambulation/Gait             General Gait Details: reports she uses a power chair and is due to get her  new power chair soon   Stairs             Wheelchair Mobility    Modified Rankin (Stroke Patients Only)       Balance Overall balance assessment: Needs assistance Sitting-balance support: Feet supported Sitting balance-Leahy Scale: Fair     Standing balance support: Bilateral upper extremity supported Standing balance-Leahy Scale: Poor                              Cognition Arousal/Alertness: Awake/alert Behavior During Therapy: Anxious;WFL for tasks assessed/performed Overall Cognitive Status: No family/caregiver present to determine baseline cognitive functioning                                 General Comments: pt recounting staff standing over her bed last night and setting off "poppers" and insists it was not a dream; easily self-distracted and requires cues to stay on task      Exercises      General Comments General comments (skin integrity, edema, etc.): HR 110s, sats 97% on 2L      Pertinent Vitals/Pain Pain Assessment: Faces Faces Pain Scale: Hurts a little bit Pain Location: neck and left side Pain Descriptors / Indicators: Discomfort;Sore Pain Intervention(s): Monitored during session;Limited activity within patient's tolerance    Home Living  Prior Function            PT Goals (current goals can now be found in the care plan section) Acute Rehab PT Goals Patient Stated Goal: to go to rehab, get stronger Time For Goal Achievement: 08/05/20 Potential to Achieve Goals: Good Progress towards PT goals: Progressing toward goals    Frequency    Min 2X/week      PT Plan Current plan remains appropriate;Frequency needs to be updated    Co-evaluation              AM-PAC PT "6 Clicks" Mobility   Outcome Measure  Help needed turning from your back to your side while in a flat bed without using bedrails?: A Little Help needed moving from lying on your back to sitting on the  side of a flat bed without using bedrails?: A Little Help needed moving to and from a bed to a chair (including a wheelchair)?: A Little Help needed standing up from a chair using your arms (e.g., wheelchair or bedside chair)?: A Little Help needed to walk in hospital room?: A Lot Help needed climbing 3-5 steps with a railing? : A Lot 6 Click Score: 16    End of Session Equipment Utilized During Treatment: Oxygen Activity Tolerance: Patient limited by fatigue Patient left: with call bell/phone within reach;in chair;with chair alarm set Nurse Communication: Mobility status PT Visit Diagnosis: Other abnormalities of gait and mobility (R26.89);Muscle weakness (generalized) (M62.81);Difficulty in walking, not elsewhere classified (R26.2);Pain Pain - Right/Left: Left Pain - part of body:  (neck and left side)     Time: 1062-6948 PT Time Calculation (min) (ACUTE ONLY): 32 min  Charges:  $Therapeutic Activity: 23-37 mins                      Arby Barrette, PT Pager [phone]    Rexanne Mano 07/24/2020, 3:28 PM

## 2020-07-24 NOTE — Significant Event (Signed)
Rapid Response Event Note   Reason for Call :  Changed in mental status  Initial Focused Assessment:  Initially patient was not following commands but RN was unsure if due to mental status change or just being defiant.  Attempted neuro check and NIH but patient continued to be defiant but noted R pupil 65mm reactive and left 26mm reactive.  CBG 210, pt diaphoretic. VSS  Call neuro as consult because patient didn't meet parameters for code stroke. Advised to let [facility] assess pt. After getting off the phone the patient began to follow all commands without difficulty and had no dysarthria or aphasia.  Patient moving all extremities with no facial droop or change in sensation.  Interventions:  Ct head Troponin ekg    Plan of Care:  Will continue to monitor on same unit and perform above taste and notify [facility] MD if any abnormality.    Event Summary:   MD Notified: 1936 Call Time: 1925 Arrival Time: 1921 End Time: 1940  Charlyne Quale, RN

## 2020-07-24 NOTE — Progress Notes (Signed)
Rapid Response nurse paged for patient's mental status change.  Patient alert to self and disoriented to situation, place and time. VSS. Blood sugar 210.

## 2020-07-24 NOTE — Progress Notes (Signed)
PROGRESS NOTE  [name]  DGU:[id number] DOB: 03-20-1940 DOA: 07/21/2020 PCP: [name], MD   Brief Narrative: [name] is a 80 y.o. female with a history of acquired hypogammaglobulinemia, anemia, asthma, emphysema, IDT2DM, stage IIIa CKD, dysphagia, fibromyalgia, chronic pain, GERD, hearing loss, rheumatoid arthritis, and OSA who presented to [facility] with dyspnea and chest pain found to have acute bilateral pulmonary emboli with hypoxia requiring 3L O2. Troponin negative though BP somewhat soft and RV/LV ratio by CTA was 1.3 consistent with right heart strain. Heparin infusion was started and the patient was transferred to [facility]. Echo showed normal RV function, no DVT on LE U/S. Will require SNF at discharge per PT/OT.  Assessment & Plan: Principal Problem:   Acute pulmonary embolism (HCC) Active Problems:   Asthma   Rheumatoid arthritis involving both feet with negative rheumatoid factor (HCC)   Type 2 diabetes mellitus without complication, without long-term current use of insulin (HCC)   Acute hypoxemic respiratory failure (HCC)  Acute hypoxic respiratory failure due to acute bilateral pulmonary emboli with right heart strain: RV/LV 1.3. Troponin wnl, BNP not elevated. No RV strain on echo, LE U/S negative for DVT.  - Wean oxygen as tolerated - Tylenol, toradol prn chest pain. Restarted home oxycodone prn mod-severe pain as tramadol was ineffective. Pt is opioid tolerant.  - Transition heparin to lovenox. Can switch to DOAC once renal function stabilized.    AKI:  - Renal U/S - UA, lytes, Cr ordered - Challenge with 1L IVF over course of 10 hours, recheck BMP.  IDT2DM: HbA1c 8.1% - Continue SSI, at goal.  Hypotension: This is chronic. ?if worsened overnight causing AKI. Note in conjunction with isolated fever and leukocytosis, despite these being suspected to be reactive to clot burden, we will monitor for infection.  - Check blood cultures, trend CBC - IVF as above - Check  UA. Urine culture from [facility] reviewed showing 20-50k of E. coli with some resistance (sensitive to CTX) though no pyuria on UA here.   HLD:  - Continue statin, zetia  Fibromyalgia, RA:  - Continue duloxetine, pt not taking gabapentin - Giving stress-dose steroids. Increase prednisone w/continued hypotension and wheezing.  Asthma, emphysema: No exacerbation currently.  - Continue home pulmicort - Add prn duonebs for wheezing/dyspnea. - Increase steroids.  Iron deficiency anemia: No bleeding - Continue po iron  GERD:  - Continue PPI  DVT prophylaxis: Heparin gtt > lovenox Code Status: DNR Family Communication: Daughter by phone 5/14, 5/15. Disposition Plan:  Status is: Inpatient  Remains inpatient appropriate because:Hemodynamically unstable and Ongoing diagnostic testing needed not appropriate for outpatient work up  Dispo: The patient is from: Home              Anticipated d/c is to: SNF              Patient currently is not medically stable to d/c.   Difficult to place patient No  Consultants:   None  Procedures:   None  Antimicrobials:  None   Subjective: Not eating much, no appetite. Didn't sleep well, feels her breathing is about the same and improved with breathing treatments. Denies palpitations or lightheadedness. Has stable chest pain which is better with oxycodone.  Objective: Vitals:   07/24/20 0405 07/24/20 0932 07/24/20 1214 07/24/20 1233  BP: (!) 128/48   (!) 97/40  Pulse: 99   (!) 108  Resp: 20   18  Temp: 98.5 F (36.9 C)  97.8 F (36.6 C)  TempSrc: Oral  Oral   SpO2: 95% 99%  93%  Weight:      Height:       No intake or output data in the 24 hours ending 07/24/20 1310 Filed Weights   07/21/20 2200 07/22/20 0000 07/22/20 1224  Weight: 77.8 kg 78.5 kg 78.5 kg   Gen: 80 y.o. female in no distress Pulm: Nonlabored breathing supplemental oxygen with scant end-expiratory wheezing which is improved from yesterday. CV: Regular rate and  rhythm. No murmur, rub, or gallop. No JVD, no pitting dependent edema. GI: Abdomen soft, non-tender, non-distended, with normoactive bowel sounds.  Ext: Warm, stable bilateral UE deformities Skin: No evidence of cellulitis or abscess or rashes, lesions or ulcers on visualized skin Neuro: Alert and oriented. No focal neurological deficits. Psych: Judgement and insight appear fair. Mood euthymic & affect congruent. Behavior is appropriate.    Data Reviewed: I have personally reviewed following labs and imaging studies  HbA1C: Recent Labs    07/21/20 2341  HGBA1C 8.1*   CBG: Recent Labs  Lab 07/23/20 1135 07/23/20 1557 07/23/20 2016 07/24/20 0914 07/24/20 1136  GLUCAP 120* 116* 142* 203* 163*   Scheduled Meds: . budesonide  0.25 mg Nebulization BID  . DULoxetine  60 mg Oral QHS  . feeding supplement  237 mL Oral BID BM  . insulin aspart  0-15 Units Subcutaneous TID WC  . insulin aspart  0-5 Units Subcutaneous QHS  . mouth rinse  15 mL Mouth Rinse BID  . mirabegron ER  50 mg Oral QHS  . predniSONE  20 mg Oral Daily   Continuous Infusions: . sodium chloride    . heparin 2,100 Units/hr (07/24/20 0956)     LOS: 3 days   Time spent: 35 minutes.  [name], MD [facility] [email or web address] 07/24/2020, 1:10 PM

## 2020-07-24 NOTE — TOC Initial Note (Signed)
Transition of Care [facility]) - Initial/Assessment Note    Patient Details  Name: [name] MRN: [id number] Date of Birth: 1940-05-29  Transition of [facility]) CM/SW Contact:    Joanne Chars, LCSW Phone Number: 07/24/2020, 10:35 AM  Clinical Narrative:    CSW met with pt regarding discharge recommendation for SNF.  Pt agreeable to this, states her daughter who is a nurse told her she did not have a choice.  Permission given to speak with daughter [name].  Choice document given, pt is interested in [facility] if possible.  Pt has [facility] HH, or else they just recently finished.  Pt is vaccinated for covid and boosted.  Pt asking about a power wheelchair that she is supposed to be completing an order form and how this would get to SNF.               Expected Discharge Plan: Skilled Nursing Facility Barriers to Discharge: Continued Medical Work up,SNF Pending bed offer   Patient Goals and CMS Choice Patient states their goals for this hospitalization and ongoing recovery are:: "I don't know" CMS Medicare.gov Compare Post Acute Care list provided to:: Patient Choice offered to / list presented to : Patient  Expected Discharge Plan and Services Expected Discharge Plan: [place] In-house Referral: Clinical Social Work   Post Acute Care Choice: [place] Living arrangements for the past 2 months: [place]                                      Prior Living Arrangements/Services Living arrangements for the past 2 months: Single Family Home Lives with:: Self Patient language and need for interpreter reviewed:: Yes Do you feel safe going back to the place where you live?: Yes      Need for Family Participation in Patient Care: Yes (Comment) Care giver support system in place?: Yes (comment) Current home services: Home PT,Home OT ([facility]) Criminal Activity/Legal Involvement Pertinent to Current Situation/Hospitalization: No - Comment  as needed  Activities of Daily Living Home Assistive Devices/Equipment: Bradley chair with back (motorized wheel chair) ADL Screening (condition at time of admission) Patient's cognitive ability adequate to safely complete daily activities?: Yes Is the patient deaf or have difficulty hearing?: Yes Does the patient have difficulty seeing, even when wearing glasses/contacts?: No Does the patient have difficulty concentrating, remembering, or making decisions?: No Patient able to express need for assistance with ADLs?: Yes Does the patient have difficulty dressing or bathing?: No Independently performs ADLs?: Yes (appropriate for developmental age) Does the patient have difficulty walking or climbing stairs?: Yes Weakness of Legs: Both Weakness of Arms/Hands: Both  Permission Sought/Granted Permission sought to share information with : Family Chief Financial Officer Permission granted to share information with : Yes, Verbal Permission Granted  Share Information with NAME: daughter [name]  Permission granted to share info w AGENCY: SNF        Emotional Assessment Appearance:: Appears stated age Attitude/Demeanor/Rapport: Engaged Affect (typically observed): Appropriate,Pleasant Orientation: : Oriented to Self,Oriented to Place,Oriented to  Time,Oriented to Situation Alcohol / Substance Use: Not Applicable Psych Involvement: No (comment)  Admission diagnosis:  Acute pulmonary embolism ([place]) [I26.99] Patient Active Problem List   Diagnosis Date Noted  . Acute pulmonary embolism ([place]) 07/21/2020  . Hypoxia 05/23/2020  . Acute hypoxemic respiratory failure ([place]) 05/22/2020  . Rotator cuff arthropathy of left shoulder 12/13/2019  .  Pain in joint of right shoulder 12/10/2019  . Pain in joint of left shoulder 12/10/2019  . Hearing loss, sensorineural, 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  . Tinnitus, left 01/08/2019  . DM type 2, not at goal [facility]) 06/03/2018  . Frequent  infections 05/21/2018  . Gangrene ([place]) 05/21/2018  . Pain in finger 05/07/2018  . Family history of aneurysm 05/07/2018  . Diabetes mellitus due to underlying condition, controlled, with complication, without long-term current use of insulin ([place])   . Lower urinary tract infectious disease   . Palpitations 01/17/2018  . Dehydration 01/17/2018  . Hypotension 01/17/2018  . Symptom of blood in stool 01/17/2018  . Diarrhea in adult patient 01/17/2018  . 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  . Aftercare following joint replacement 08/21/2017  . 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 Closed displaced transverse fracture of right patella 05/06/2017  . Anemia 04/16/2017  . Chronic interstitial lung disease ([place]) 04/16/2017  . Chronic kidney disease (CKD), stage III (moderate) ([place]) 04/16/2017  . Other hyperlipidemia 04/16/2017  . Type 2 diabetes mellitus without complication, without long-term current use of insulin ([place]) 04/16/2017  . Pain in right knee 03/25/2017  . Aftercare 03/25/2017  . Arthritis, septic, knee ([place]) 03/09/2017  . 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[place]) 06/04/2016  . Fibromyalgia 06/04/2016  . High risk medication use 06/04/2016  . Claw toe, acquired, left 01/29/2016  . 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 Septic prepatellar bursitis of right knee 10/26/2014  . Prepatellar bursitis of right knee 08/28/2014  . DYSPHAGIA ORAL PHASE 06/18/2007  . Asthma 05/14/2007  . SLEEP APNEA 05/14/2007  . Sleep apnea 05/14/2007  . GERD 05/13/2007  . Rheumatoid arthritis ([place]) 05/13/2007  . OSTEOPOROSIS 05/13/2007  . DIARRHEA, CHRONIC 05/13/2007   PCP:  [name], MD Pharmacy:   [place], [place] - [address] [place] [address] Phone: [phone] Fax:  [phone]     Social Determinants of Health (SDOH) Interventions    Readmission Risk Interventions No flowsheet data found.

## 2020-07-24 NOTE — Progress Notes (Signed)
ANTICOAGULATION CONSULT NOTE - Follow Up Consult  Pharmacy Consult for Heparin > Lovenox Indication: pulmonary embolus  Allergies  Allergen Reactions  . Penicillins Hives    Has patient had a PCN reaction causing immediate rash, facial/tongue/throat swelling, SOB or lightheadedness with hypotension: Yes Has patient had a PCN reaction causing severe rash involving mucus membranes or skin necrosis: No Has patient had a PCN reaction that required hospitalization: No Has patient had a PCN reaction occurring within the last 10 years: No If all of the above answers are "NO", then may proceed with Cephalosporin use. Patient has tolerated ceftriaxone in the recent past.  . Sulfamethoxazole-Trimethoprim Nausea And Vomiting and Other (See Comments)    Stomach problems  . Meperidine Other (See Comments)    Cramping  . Meperidine Hcl Other (See Comments)    Cramping   . Penicillin G Sodium Hives  . Sulfa Antibiotics Nausea And Vomiting and Other (See Comments)    Stomach problems/GI upset  . Sulfonamide Derivatives Nausea And Vomiting and Other (See Comments)    "Stomach problems"    Patient Measurements: Height: 5' 5.98" (167.6 cm) Weight: 78.5 kg (173 lb 1 oz) IBW/kg (Calculated) : 59.26  Vital Signs: Temp: 97.8 F (36.6 C) (05/16 1214) Temp Source: Oral (05/16 1214) BP: 97/40 (05/16 1233) Pulse Rate: 108 (05/16 1233)  Labs: Recent Labs    07/22/20 0258 07/22/20 1124 07/22/20 1959 07/23/20 0348 07/23/20 1352 07/23/20 2056 07/24/20 0225 07/24/20 1124  HGB  --    < > 9.6* 9.8*  --   --  9.5*  --   HCT  --   --  31.7* 33.7*  --   --  31.8*  --   PLT  --   --  406* 372  --   --  397  --   HEPARINUNFRC  --    < > 0.12*  --    < > 0.40 0.26* 0.16*  CREATININE  --   --   --  0.85  --   --  1.31*  --   TROPONINIHS 8  --   --   --   --   --   --   --    < > = values in this interval not displayed.    Estimated Creatinine Clearance: 37.4 mL/min (A) (by C-G formula based on SCr  of 1.31 mg/dL (H)).   Medications:  Scheduled:  . budesonide  0.25 mg Nebulization BID  . DULoxetine  60 mg Oral QHS  . feeding supplement  237 mL Oral BID BM  . insulin aspart  0-15 Units Subcutaneous TID WC  . insulin aspart  0-5 Units Subcutaneous QHS  . mouth rinse  15 mL Mouth Rinse BID  . mirabegron ER  50 mg Oral QHS  . predniSONE  40 mg Oral Q breakfast   Infusions:  . sodium chloride 100 mL/hr at 07/24/20 1317  . heparin 2,100 Units/hr (07/24/20 7782)    Assessment: 80 yo F with bilateral PE diagnosed at [facility].  Heparin level has been subtherapeutic x 2.  Per MD, patient is stable to transition to Lovenox at this time.   Goal of Therapy:  Anti-Xa level 0.6-1 units/ml 4hrs after LMWH dose given Monitor platelets by anticoagulation protocol: Yes   Plan:  D/C heparin infusion. Start Lovenox 80mg  SQ q12h Monitor CBC q72 hours  [employer], Pharm.D., BCPS Clinical Pharmacist  **Pharmacist phone directory can be found on [email or web address] listed under [place].  07/24/2020  1:31 PM

## 2020-07-25 ENCOUNTER — Ambulatory Visit: Payer: Self-pay | Admitting: *Deleted

## 2020-07-25 ENCOUNTER — Inpatient Hospital Stay (HOSPITAL_COMMUNITY): Payer: Medicare Other

## 2020-07-25 LAB — GLUCOSE, CAPILLARY
Glucose-Capillary: 179 mg/dL — ABNORMAL HIGH (ref 70–99)
Glucose-Capillary: 175 mg/dL — ABNORMAL HIGH (ref 70–99)
Glucose-Capillary: 252 mg/dL — ABNORMAL HIGH (ref 70–99)
Glucose-Capillary: 263 mg/dL — ABNORMAL HIGH (ref 70–99)

## 2020-07-25 LAB — CBC
HCT: 31.8 % — ABNORMAL LOW (ref 36.0–46.0)
Hemoglobin: 9.4 g/dL — ABNORMAL LOW (ref 12.0–15.0)
MCH: 23.6 pg — ABNORMAL LOW (ref 26.0–34.0)
MCHC: 29.6 g/dL — ABNORMAL LOW (ref 30.0–36.0)
MCV: 79.9 fL — ABNORMAL LOW (ref 80.0–100.0)
Platelets: 410 10*3/uL — ABNORMAL HIGH (ref 150–400)
RBC: 3.98 MIL/uL (ref 3.87–5.11)
RDW: 15.9 % — ABNORMAL HIGH (ref 11.5–15.5)
WBC: 10.8 10*3/uL — ABNORMAL HIGH (ref 4.0–10.5)
nRBC: 0 % (ref 0.0–0.2)

## 2020-07-25 LAB — BLOOD CULTURE ID PANEL (REFLEXED) - BCID2

## 2020-07-25 LAB — BASIC METABOLIC PANEL
Anion gap: 7 (ref 5–15)
BUN: 23 mg/dL (ref 8–23)
CO2: 21 mmol/L — ABNORMAL LOW (ref 22–32)
Calcium: 7.6 mg/dL — ABNORMAL LOW (ref 8.9–10.3)
Chloride: 105 mmol/L (ref 98–111)
Creatinine, Ser: 1.01 mg/dL — ABNORMAL HIGH (ref 0.44–1.00)
GFR, Estimated: 57 mL/min — ABNORMAL LOW (ref >60)
Glucose, Bld: 237 mg/dL — ABNORMAL HIGH (ref 70–99)
Potassium: 4.7 mmol/L (ref 3.5–5.1)
Sodium: 133 mmol/L — ABNORMAL LOW (ref 135–145)

## 2020-07-25 LAB — TROPONIN I (HIGH SENSITIVITY): Troponin I (High Sensitivity): 11 ng/L (ref <18)

## 2020-07-25 NOTE — Progress Notes (Signed)
PROGRESS NOTE  [name]  ION:[id number] DOB: 1941/01/24 DOA: 07/21/2020 PCP: [name], MD   Brief Narrative: [name] is a 80 y.o. female with a history of acquired hypogammaglobulinemia, anemia, asthma, emphysema, IDT2DM, stage IIIa CKD, dysphagia, fibromyalgia, chronic pain, GERD, hearing loss, rheumatoid arthritis, and OSA who presented to [facility] with dyspnea and chest pain found to have acute bilateral pulmonary emboli with hypoxia requiring 3L O2. Troponin negative though BP somewhat soft and RV/LV ratio by CTA was 1.3 consistent with right heart strain. Heparin infusion was started and the patient was transferred to [facility]. Echo showed normal RV function, no DVT on LE U/S. Will require SNF at discharge per PT/OT, with family's anticipation of requiring LTC thereafter.  Assessment & Plan: Principal Problem:   Acute pulmonary embolism (HCC) Active Problems:   Asthma   Rheumatoid arthritis involving both feet with negative rheumatoid factor (HCC)   Type 2 diabetes mellitus without complication, without long-term current use of insulin (HCC)   Acute hypoxemic respiratory failure (HCC)  Acute hypoxic respiratory failure due to acute bilateral pulmonary emboli with right heart strain: RV/LV 1.3. Troponin wnl, BNP not elevated. No RV strain on echo, LE U/S negative for DVT.  - Has been liberated from supplemental oxygen as of 5/17. - Continue home oxycodone prn mod-severe pain as tramadol was ineffective. Pt is opioid tolerant.  - Transition heparin to lovenox. Plan to DC on DOAC.  AKI:  - Renal U/S normal. Appeared prerenal, improved with trial of IV fluids. Recommend recheck in next week and avoidance of nephrotoxins.   IDT2DM: HbA1c 8.1% - Continue SSI, at goal.  Hypotension: This is chronic. ?if worsened overnight causing AKI. Note in conjunction with isolated fever and leukocytosis, despite these being suspected to be reactive to clot burden, we will monitor for infection.  - WBC  down without antimicrobials, cultures NGTD.  - Urine culture from [facility] reviewed showing 20-50k of E. coli with some resistance (sensitive to CTX) though no pyuria on UA here.   HLD:  - Continue statin, zetia  Fibromyalgia, RA:  - Continue duloxetine, pt not taking gabapentin - Giving stress-dose steroids. Increase prednisone w/continued hypotension and wheezing.  Asthma, emphysema with exacerbation: - Continue home pulmicort - Add prn duonebs for wheezing/dyspnea. - Wheezing subsided with increased steroids (prednisone up to 40mg  dosing).  Iron deficiency anemia: No bleeding - Continue po iron  GERD:  - Continue PPI  DVT prophylaxis: Heparin gtt > lovenox Code Status: DNR Family Communication: Daughter by phone 5/14, 5/15. Disposition Plan:  Status is: Inpatient  Remains inpatient appropriate because:Hemodynamically unstable and Ongoing diagnostic testing needed not appropriate for outpatient work up  Dispo: The patient is from: Home              Anticipated d/c is to: SNF              Patient currently is medically stable to d/c. We have worked diligently with family and facilities, though remote insurance issues stalling forward progress. Will likely be ready from this perspective for discharge 5/18.   Difficult to place patient No  Consultants:   None  Procedures:   None  Antimicrobials:  None   Subjective: Shortness of breath is improved but remains worse than baseline. Chest pain described as "pinching" worse with movements and deep breaths. No bleeding reported. The patient was not following commands last night so the RN called RRT and CT head was ordered which was nonacute. Troponin was checked and  normal.   Objective: Vitals:   07/25/20 0744 07/25/20 0800 07/25/20 1327 07/25/20 1534  BP: (!) 93/56  (!) 100/51 123/73  Pulse: 95  97 94  Resp: 19  18 18   Temp: 97.6 F (36.4 C)  97.7 F (36.5 C) 98.1 F (36.7 C)  TempSrc: Oral  Oral Oral  SpO2: 94% 94%  94% 94%  Weight:      Height:        Intake/Output Summary (Last 24 hours) at 07/25/2020 1549 Last data filed at 07/24/2020 1840 Gross per 24 hour  Intake 534.9 ml  Output --  Net 534.9 ml   Filed Weights   07/21/20 2200 07/22/20 0000 07/22/20 1224  Weight: 77.8 kg 78.5 kg 78.5 kg   Gen: 80 y.o. female in no distress Pulm: Nonlabored breathing room air. No wheezes, diminished throughout. CV: Regular rate and rhythm. No murmur, rub, or gallop. No JVD, no pitting dependent edema. GI: Abdomen soft, non-tender, non-distended, with normoactive bowel sounds.  Ext: Warm, no deformities Skin: No new rashes, lesions or ulcers on visualized skin. Neuro: Alert and oriented. No focal neurological deficits. Psych: Judgement and insight appear fair. Mood anxious & affect congruent. Behavior is appropriate. Verbose.    Data Reviewed: I have personally reviewed following labs and imaging studies  HbA1C: No results for input(s): HGBA1C in the last 72 hours. CBG: Recent Labs  Lab 07/24/20 1931 07/24/20 2226 07/25/20 0741 07/25/20 1240 07/25/20 1533  GLUCAP 210* 234* 179* 175* 252*   Scheduled Meds: . budesonide  0.25 mg Nebulization BID  . DULoxetine  60 mg Oral QHS  . enoxaparin (LOVENOX) injection  80 mg Subcutaneous Q12H  . feeding supplement  237 mL Oral BID BM  . insulin aspart  0-15 Units Subcutaneous TID WC  . insulin aspart  0-5 Units Subcutaneous QHS  . mouth rinse  15 mL Mouth Rinse BID  . mirabegron ER  50 mg Oral QHS  . predniSONE  40 mg Oral Q breakfast   Continuous Infusions:    LOS: 4 days   Time spent: 25 minutes.  [name], MD [facility] [email or web address] 07/25/2020, 3:49 PM

## 2020-07-25 NOTE — Consult Note (Signed)
   [facility] Inpatient Consult   07/25/2020  [name] 06/29/1940 [id number]  [facility] [ACO] Patient: Medicare CMS DCE  Primary Care Provider: [name], MD,  [facility], [place] is listed to provide the transition of care follow up and appointments.  Patient has been active with a [place] and a recent outreach with [facility] Social Worker referral on the day of hospital admission and with [facility] social worker from [place] noted in documentation.   Plan:  Patient will be followed by [place] [facility] PAC with traditional Medicare for any known or needs for transitional care needs for returning to post facility care or complex disease management, if the patient transitions to a [facility] affiliated facility.  Will continue to follow for disposition.  For questions or referrals, please contact:   Natividad Brood, RN BSN [facility] Liaison  [phone] business mobile phone Toll free office [phone]  Fax number: [phone] [email or web address]

## 2020-07-25 NOTE — Progress Notes (Signed)
PHARMACY - PHYSICIAN COMMUNICATION CRITICAL VALUE ALERT - BLOOD CULTURE IDENTIFICATION (BCID)  [name] is an 80 y.o. female who presented to [facility] on 07/21/2020 with a chief complaint of dyspnea and chest pain found to have acute pulmonary embolus  Assessment:  1/out of 4 bottles with GPCs identified as staph epi likely contaminate   Current antibiotics: None  Changes to prescribed antibiotics recommended:  No change recommended  Results for orders placed or performed during the hospital encounter of 07/21/20  Blood Culture ID Panel (Reflexed) (Collected: 07/24/2020  6:11 PM)  Result Value Ref Range   Enterococcus faecalis NOT DETECTED NOT DETECTED   Enterococcus Faecium NOT DETECTED NOT DETECTED   Listeria monocytogenes NOT DETECTED NOT DETECTED   Staphylococcus species DETECTED (A) NOT DETECTED   Staphylococcus aureus (BCID) NOT DETECTED NOT DETECTED   Staphylococcus epidermidis DETECTED (A) NOT DETECTED   Staphylococcus lugdunensis NOT DETECTED NOT DETECTED   Streptococcus species NOT DETECTED NOT DETECTED   Streptococcus agalactiae NOT DETECTED NOT DETECTED   Streptococcus pneumoniae NOT DETECTED NOT DETECTED   Streptococcus pyogenes NOT DETECTED NOT DETECTED   A.calcoaceticus-baumannii NOT DETECTED NOT DETECTED   Bacteroides fragilis NOT DETECTED NOT DETECTED   Enterobacterales NOT DETECTED NOT DETECTED   Enterobacter cloacae complex NOT DETECTED NOT DETECTED   Escherichia coli NOT DETECTED NOT DETECTED   Klebsiella aerogenes NOT DETECTED NOT DETECTED   Klebsiella oxytoca NOT DETECTED NOT DETECTED   Klebsiella pneumoniae NOT DETECTED NOT DETECTED   Proteus species NOT DETECTED NOT DETECTED   Salmonella species NOT DETECTED NOT DETECTED   Serratia marcescens NOT DETECTED NOT DETECTED   Haemophilus influenzae NOT DETECTED NOT DETECTED   Neisseria meningitidis NOT DETECTED NOT DETECTED   Pseudomonas aeruginosa NOT DETECTED NOT DETECTED   Stenotrophomonas maltophilia  NOT DETECTED NOT DETECTED   Candida albicans NOT DETECTED NOT DETECTED   Candida auris NOT DETECTED NOT DETECTED   Candida glabrata NOT DETECTED NOT DETECTED   Candida krusei NOT DETECTED NOT DETECTED   Candida parapsilosis NOT DETECTED NOT DETECTED   Candida tropicalis NOT DETECTED NOT DETECTED   Cryptococcus neoformans/gattii NOT DETECTED NOT DETECTED   Methicillin resistance mecA/C NOT DETECTED NOT [place] 07/25/2020  9:19 PM

## 2020-07-25 NOTE — TOC Progression Note (Addendum)
Transition of Care [facility]) - Progression Note    Patient Details  Name: [name] MRN: [id number] Date of Birth: [birth year]  Transition of Care [facility]) CM/SW Contact  Joanne Chars, LCSW Phone Number: 07/25/2020, 9:21 AM  Clinical Narrative:  CSW spoke with daughter [name], provided bed offers.  She reports her mother has needed to be in a facility for some time and will most likely change her mind about agreeing to SNF.  They are planning to pursue her going straight to LTC after rehab.  She will review the bed offers and discuss with her mother.  1100: [facility] cannot offer bed until later this week.  They also are not long term option.  Daughter informed, their next choice would be [name].   1200:  CSW spoke with [name] at [facility], she can accept pt and would be option for LTC, also does have private room available.  [name] found an issue with 3rd listed insurance, there is some sort of open claim from 1994 that needs to be closed or it will interfere with billing Medicare.  [name] provided [place] to fix this: [phone].  Daughter called and was told pt needs to give authorization for them to speak with her.  CSW and pt called, provided needed information for daughter to speak on pt behalf.  1330: Several additional phone calls to the recovery center while daughter was also on the line.  They report that the issue is not corrected but will not show in the system for 72 hours.  Daughter emailed two form letters for pt to sign which were then faxed to recovery center.  [name] at [facility] unable to verify in system.  MD informed.  Will continue to work on this Technical sales engineer.     1620: [name] at [facility] reports they have confirmed change and are all set for admission tomorrow.        Expected Discharge Plan: [place] Barriers to Discharge: Continued Medical Work up,SNF Pending bed offer  Expected Discharge Plan and Services Expected  Discharge Plan: [place] In-house Referral: Clinical Social Work   Post Acute Care Choice: [place] Living arrangements for the past 2 months: Single Family Home Expected Discharge Date: 07/25/20                                     Social Determinants of Health (SDOH) Interventions    Readmission Risk Interventions No flowsheet data found.

## 2020-07-25 NOTE — Progress Notes (Addendum)
Inpatient Diabetes Program Recommendations  AACE/ADA: New Consensus Statement on Inpatient Glycemic Control (2015)  Target Ranges:  Prepandial:   less than 140 mg/dL      Peak postprandial:   less than 180 mg/dL (1-2 hours)      Critically ill patients:  140 - 180 mg/dL   Lab Results  Component Value Date   GLUCAP 179 (H) 07/25/2020   HGBA1C 8.1 (H) 07/21/2020    Review of Glycemic Control Results for [name], [name] (MRN [id number]) as of 07/25/2020 11:22  Ref. Range 07/24/2020 09:14 07/24/2020 11:36 07/24/2020 16:04 07/24/2020 19:31 07/24/2020 22:26 07/25/2020 07:41  Glucose-Capillary Latest Ref Range: 70 - 99 mg/dL 203 (H) 163 (H) 224 (H) 210 (H) 234 (H) 179 (H)   Diabetes history: DM 2 Outpatient Diabetes medications: 70/30 14 units bid, metformin 1000 mg bid Current orders for Inpatient glycemic control:  Novolog 0-15 units tid + hs  Inpatient Diabetes Program Recommendations:    PO prednisone 40 mg Daily Glucose trends increase after steroid dose ad meals  - Add Novolog 4 units tid meal coverage if eating >50% of meals.  Thanks,  Tama Headings RN, MSN, BC-ADM Inpatient Diabetes Coordinator Team Pager [phone] (8a-5p) ]

## 2020-07-26 DIAGNOSIS — R079 Chest pain, unspecified: Secondary | ICD-10-CM | POA: Diagnosis not present

## 2020-07-26 DIAGNOSIS — M810 Age-related osteoporosis without current pathological fracture: Secondary | ICD-10-CM | POA: Diagnosis not present

## 2020-07-26 DIAGNOSIS — K529 Noninfective gastroenteritis and colitis, unspecified: Secondary | ICD-10-CM | POA: Diagnosis not present

## 2020-07-26 DIAGNOSIS — N179 Acute kidney failure, unspecified: Secondary | ICD-10-CM | POA: Diagnosis not present

## 2020-07-26 DIAGNOSIS — H903 Sensorineural hearing loss, bilateral: Secondary | ICD-10-CM | POA: Diagnosis not present

## 2020-07-26 DIAGNOSIS — E11618 Type 2 diabetes mellitus with other diabetic arthropathy: Secondary | ICD-10-CM | POA: Diagnosis not present

## 2020-07-26 DIAGNOSIS — G8929 Other chronic pain: Secondary | ICD-10-CM | POA: Diagnosis not present

## 2020-07-26 DIAGNOSIS — R0600 Dyspnea, unspecified: Secondary | ICD-10-CM | POA: Diagnosis not present

## 2020-07-26 DIAGNOSIS — K219 Gastro-esophageal reflux disease without esophagitis: Secondary | ICD-10-CM | POA: Diagnosis not present

## 2020-07-26 DIAGNOSIS — J45909 Unspecified asthma, uncomplicated: Secondary | ICD-10-CM | POA: Diagnosis not present

## 2020-07-26 DIAGNOSIS — D849 Immunodeficiency, unspecified: Secondary | ICD-10-CM | POA: Diagnosis not present

## 2020-07-26 DIAGNOSIS — Z794 Long term (current) use of insulin: Secondary | ICD-10-CM | POA: Diagnosis not present

## 2020-07-26 DIAGNOSIS — J84115 Respiratory bronchiolitis interstitial lung disease: Secondary | ICD-10-CM | POA: Diagnosis not present

## 2020-07-26 DIAGNOSIS — R32 Unspecified urinary incontinence: Secondary | ICD-10-CM | POA: Diagnosis not present

## 2020-07-26 DIAGNOSIS — F4323 Adjustment disorder with mixed anxiety and depressed mood: Secondary | ICD-10-CM | POA: Diagnosis not present

## 2020-07-26 DIAGNOSIS — M069 Rheumatoid arthritis, unspecified: Secondary | ICD-10-CM | POA: Diagnosis not present

## 2020-07-26 DIAGNOSIS — R0902 Hypoxemia: Secondary | ICD-10-CM | POA: Diagnosis not present

## 2020-07-26 DIAGNOSIS — M06071 Rheumatoid arthritis without rheumatoid factor, right ankle and foot: Secondary | ICD-10-CM | POA: Diagnosis not present

## 2020-07-26 DIAGNOSIS — D800 Hereditary hypogammaglobulinemia: Secondary | ICD-10-CM | POA: Diagnosis not present

## 2020-07-26 DIAGNOSIS — E611 Iron deficiency: Secondary | ICD-10-CM | POA: Diagnosis not present

## 2020-07-26 DIAGNOSIS — I2699 Other pulmonary embolism without acute cor pulmonale: Secondary | ICD-10-CM | POA: Diagnosis not present

## 2020-07-26 DIAGNOSIS — Z7401 Bed confinement status: Secondary | ICD-10-CM | POA: Diagnosis not present

## 2020-07-26 DIAGNOSIS — J9601 Acute respiratory failure with hypoxia: Secondary | ICD-10-CM | POA: Diagnosis not present

## 2020-07-26 DIAGNOSIS — G4730 Sleep apnea, unspecified: Secondary | ICD-10-CM | POA: Diagnosis not present

## 2020-07-26 DIAGNOSIS — M06072 Rheumatoid arthritis without rheumatoid factor, left ankle and foot: Secondary | ICD-10-CM | POA: Diagnosis not present

## 2020-07-26 DIAGNOSIS — N183 Chronic kidney disease, stage 3 unspecified: Secondary | ICD-10-CM | POA: Diagnosis not present

## 2020-07-26 DIAGNOSIS — F411 Generalized anxiety disorder: Secondary | ICD-10-CM | POA: Diagnosis not present

## 2020-07-26 DIAGNOSIS — M0689 Other specified rheumatoid arthritis, multiple sites: Secondary | ICD-10-CM | POA: Diagnosis not present

## 2020-07-26 DIAGNOSIS — R5381 Other malaise: Secondary | ICD-10-CM | POA: Diagnosis not present

## 2020-07-26 DIAGNOSIS — H9312 Tinnitus, left ear: Secondary | ICD-10-CM | POA: Diagnosis not present

## 2020-07-26 LAB — RESP PANEL BY RT-PCR (FLU A&B, COVID) ARPGX2
Influenza A by PCR: NEGATIVE
Influenza B by PCR: NEGATIVE
SARS Coronavirus 2 by RT PCR: NEGATIVE

## 2020-07-26 LAB — GLUCOSE, CAPILLARY
Glucose-Capillary: 167 mg/dL — ABNORMAL HIGH (ref 70–99)
Glucose-Capillary: 227 mg/dL — ABNORMAL HIGH (ref 70–99)
Glucose-Capillary: 260 mg/dL — ABNORMAL HIGH (ref 70–99)

## 2020-07-26 MED ORDER — OXYCODONE HCL 5 MG PO TABS: 5.0000 mg | ORAL_TABLET | Freq: Four times a day (QID) | ORAL | 0 refills | Status: DC | PRN

## 2020-07-26 MED ORDER — PREDNISONE 10 MG PO TABS: ORAL_TABLET | ORAL | 0 refills | Status: AC

## 2020-07-26 MED ORDER — APIXABAN 5 MG PO TABS: 5.0000 mg | ORAL_TABLET | Freq: Two times a day (BID) | ORAL | Status: DC

## 2020-07-26 MED ORDER — APIXABAN 5 MG PO TABS: 10.0000 mg | ORAL_TABLET | Freq: Two times a day (BID) | ORAL | 0 refills | Status: DC

## 2020-07-26 MED ORDER — APIXABAN 5 MG PO TABS
10.0000 mg | ORAL_TABLET | Freq: Two times a day (BID) | ORAL | Status: DC
  Administered 2020-07-26: 10 mg via ORAL
  Filled 2020-07-26: qty 2

## 2020-07-26 MED ORDER — APIXABAN 5 MG PO TABS: 5.0000 mg | ORAL_TABLET | Freq: Two times a day (BID) | ORAL | 0 refills | Status: DC

## 2020-07-26 NOTE — Progress Notes (Signed)
Report called to [place] at [facility].

## 2020-07-26 NOTE — TOC Transition Note (Signed)
Transition of Care [facility]) - CM/SW Discharge Note   Patient Details  Name: [name] MRN: [id number] Date of Birth: [birth year]  Transition of Care [facility]) CM/SW Contact:  Joanne Chars, LCSW Phone Number: 07/26/2020, 3:39 PM   Clinical Narrative:   Pt discharging to [facility].  RN call report to [phone].    Final next level of care: Skilled Nursing Facility Barriers to Discharge: Barriers Resolved   Patient Goals and CMS Choice Patient states their goals for this hospitalization and ongoing recovery are:: "I don't know" CMS Medicare.gov Compare Post Acute Care list provided to:: Patient Choice offered to / list presented to : Patient  Discharge Placement              Patient chooses bed at:  [facility]) Patient to be transferred to facility by: [facility] Name of family member notified: daughter Pam Patient and family notified of of transfer: 07/26/20  Discharge Plan and Services In-house Referral: Clinical Social Work   Post Acute Care Choice: [place]                               Social Determinants of Health ([place]) Interventions     Readmission Risk Interventions No flowsheet data found.

## 2020-07-26 NOTE — Discharge Instructions (Signed)
Please resume daily 5mg  prednisone after completing prednisone taper.  _____________________________________________ Information on my medicine - ELIQUIS (apixaban)  This medication education was reviewed with me or my healthcare representative as part of my discharge preparation.    Why was Eliquis prescribed for you? 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  What do You need to know about Eliquis ? The starting dose is 10 mg (two 5 mg tablets) taken TWICE daily for the FIRST SEVEN (7) DAYS, then on 08/02/2020 the dose is reduced to ONE 5 mg tablet taken TWICE daily.  Eliquis may be taken with or without food.   Try to take the dose about the same time in the morning and in the evening. If you have difficulty swallowing the tablet whole please discuss with your pharmacist how to take the medication safely.  Take Eliquis exactly as prescribed and DO NOT stop taking Eliquis without talking to the doctor who prescribed the medication.  Stopping may increase your risk of developing a new blood clot.  Refill your prescription before you run out.  After discharge, you should have regular check-up appointments with your healthcare provider that is prescribing your Eliquis.    What do you do if you miss a dose? If a dose of ELIQUIS is not taken at the scheduled time, take it as soon as possible on the same day and twice-daily administration should be resumed. The dose should not be doubled to make up for a missed dose.  Important Safety Information A possible side effect of Eliquis is bleeding. You should call your healthcare provider right away if you experience any of the following: ? Bleeding from an injury or your nose that does not stop. ? Unusual colored urine (red or dark brown) or unusual colored stools (red or  black). ? Unusual bruising for unknown reasons. ? A serious fall or if you hit your head (even if there is no bleeding).  Some medicines may interact with Eliquis and might increase your risk of bleeding or clotting while on Eliquis. To help avoid this, consult your healthcare provider or pharmacist prior to using any new prescription or non-prescription medications, including herbals, vitamins, non-steroidal anti-inflammatory drugs (NSAIDs) and supplements.  This website has more information on Eliquis (apixaban): http://www.eliquis.com/eliquis/home

## 2020-07-26 NOTE — Discharge Summary (Addendum)
Physician Discharge Summary  [name] [id number] DOB: 1940-05-05 DOA: 07/21/2020  PCP: [name], MD  Admit date: 07/21/2020 Discharge date: 07/26/2020  Admitted From: Home Disposition:  SNF  Recommendations for Outpatient Follow-up:  1. Follow up with PCP in 1-2 weeks  Discharge Condition:Stable CODE STATUS:DNR Diet recommendation: Diabetic   Brief/Interim Summary: 80 y.o. female with a history of acquired hypogammaglobulinemia, anemia, asthma, emphysema, IDT2DM, stage IIIa CKD, dysphagia, fibromyalgia, chronic pain, GERD, hearing loss, rheumatoid arthritis, and OSA who presented to [facility] with dyspnea and chest pain found to have acute bilateral pulmonary emboli with hypoxia requiring 3L O2. Troponin negative though BP somewhat soft and RV/LV ratio by CTA was 1.3 consistent with right heart strain  Discharge Diagnoses:  Principal Problem:   Acute pulmonary embolism ([place]) Active Problems:   Asthma   Rheumatoid arthritis involving both feet with negative rheumatoid factor (HCC)   Type 2 diabetes mellitus without complication, without long-term current use of insulin (HCC)   Acute hypoxemic respiratory failure (HCC)  Acute hypoxic respiratory failure due to acute bilateral pulmonary emboli with right heart strain: RV/LV 1.3. Troponin wnl, BNP not elevated. No RV strain on echo, LE U/S negative for DVT.  - Has been liberated from supplemental oxygen as of 5/17. - Continue home oxycodone prn mod-severe pain as tramadol was ineffective. Pt is opioid tolerant.  - Transition heparin to lovenox. Will transition to eliquis on d/c  AKI:  - Renal U/S normal. Appeared prerenal, improved with trial of IV fluids. Recommend recheck in 1-2 weeks and avoidance of nephrotoxins.   IDT2DM: HbA1c 8.1% - Continue SSI, at goal.  Hypotension: This is chronic. ?if worsened overnight causing AKI. Note in conjunction with isolated fever and leukocytosis, despite these being suspected to be  reactive to clot burden, we will monitor for infection.  - WBC down without antimicrobials, cultures NGTD.  - Urine culture from [facility] reviewed showing 20-50k of E. coli with some resistance (sensitive to CTX) though no pyuria on UA -Remained stable  HLD:  - Continue statin, zetia  Fibromyalgia, RA:  - Continue duloxetine, pt not taking gabapentin - Giving stress-dose steroids. Increase prednisone w/continued hypotension and wheezing, would wean steroids over time then resume daily 5mg  dosing  Asthma, emphysema with exacerbation: - Continue home pulmicort - Add prn duonebs for wheezing/dyspnea. - Wheezing subsided with increased steroids (prednisone up to 40mg  dosing). -Wean prednisone over time and resume home 5mg  dosing  Iron deficiency anemia: No bleeding - Continue po iron  GERD:  - Continue PPI  Discharge Instructions   Allergies as of 07/26/2020      Reactions   Penicillins Hives   Has patient had a PCN reaction causing immediate rash, facial/tongue/throat swelling, SOB or lightheadedness with hypotension: Yes Has patient had a PCN reaction causing severe rash involving mucus membranes or skin necrosis: No Has patient had a PCN reaction that required hospitalization: No Has patient had a PCN reaction occurring within the last 10 years: No If all of the above answers are "NO", then may proceed with Cephalosporin use. Patient has tolerated ceftriaxone in the recent past.   Sulfamethoxazole-trimethoprim Nausea And Vomiting, Other (See Comments)   Stomach problems   Meperidine Other (See Comments)   Cramping   Meperidine Hcl Other (See Comments)   Cramping   Penicillin G Sodium Hives   Sulfa Antibiotics Nausea And Vomiting, Other (See Comments)   Stomach problems/GI upset   Sulfonamide Derivatives Nausea And Vomiting, Other (See Comments)   "Stomach problems"  Medication List    STOP taking these medications   clotrimazole-betamethasone cream Commonly  known as: LOTRISONE   ferrous sulfate 325 (65 FE) MG tablet   Fish Oil 1200 MG Caps   gabapentin 100 MG capsule Commonly known as: NEURONTIN   multivitamin with minerals Tabs tablet     TAKE these medications   acetaminophen 500 MG tablet Commonly known as: TYLENOL Take 500-1,000 mg by mouth every 6 (six) hours as needed (for headaches).   albuterol 108 (90 Base) MCG/ACT inhaler Commonly known as: VENTOLIN HFA Inhale 2 puffs into the lungs every 6 (six) hours as needed for shortness of breath or wheezing.   apixaban 5 MG Tabs tablet Commonly known as: ELIQUIS Take 2 tablets (10 mg total) by mouth 2 (two) times daily.   apixaban 5 MG Tabs tablet Commonly known as: ELIQUIS Take 1 tablet (5 mg total) by mouth 2 (two) times daily. Start taking on: Aug 02, 2020   arformoterol 15 MCG/2ML Nebu Commonly known as: BROVANA Take 2 mLs (15 mcg total) by nebulization 2 (two) times daily.   atorvastatin 20 MG tablet Commonly known as: LIPITOR Take 10 mg by mouth at bedtime.   B-12 2500 MCG Tabs Take 2,500 mcg by mouth at bedtime.   B-D UF III MINI PEN NEEDLES 31G X 5 MM Misc Generic drug: Insulin Pen Needle SMARTSIG:1 Each SUB-Q 4 Times Daily   budesonide 0.25 MG/2ML nebulizer solution Commonly known as: PULMICORT Take 2 mLs (0.25 mg total) by nebulization 2 (two) times daily.   Compressor/Nebulizer [employer] for asthma/emphysema   DULoxetine 60 MG capsule Commonly known as: CYMBALTA Take 60 mg by mouth at bedtime.   ezetimibe 10 MG tablet Commonly known as: ZETIA Take 10 mg by mouth at bedtime.   insulin NPH-regular Human (70-30) 100 UNIT/ML injection Inject 14 Units into the skin 2 (two) times daily with a meal.   ipratropium-albuterol 0.5-2.5 (3) MG/3ML Soln Commonly known as: DUONEB Take 3 mLs by nebulization every 6 (six) hours as needed.   loperamide 2 MG tablet Commonly known as: IMODIUM A-D Take 2 mg by mouth 4 (four) times  daily as needed for diarrhea or loose stools.   metFORMIN 500 MG tablet Commonly known as: GLUCOPHAGE Take 1,000 mg by mouth at bedtime.   Myrbetriq 50 MG Tb24 tablet Generic drug: mirabegron ER Take 50 mg by mouth at bedtime.   One-A-Day Womens 50 Plus Tabs Take 1 tablet by mouth daily with breakfast.   OneTouch Verio test strip Generic drug: glucose blood   oxyCODONE 5 MG immediate release tablet Commonly known as: Oxy IR/ROXICODONE Take 1 tablet (5 mg total) by mouth every 6 (six) hours as needed for severe pain or moderate pain. What changed: how much to take   pantoprazole 40 MG tablet Commonly known as: Protonix Take one tablet once every morning What changed:   how much to take  how to take this  when to take this  additional instructions   predniSONE 5 MG tablet Commonly known as: DELTASONE Take 5 mg by mouth at bedtime. What changed: Another medication with the same name was changed. Make sure you understand how and when to take each.   predniSONE 10 MG tablet Commonly known as: DELTASONE Take 4 tablets (40 mg total) by mouth daily for 2 days, THEN 2 tablets (20 mg total) daily for 2 days, THEN 1 tablet (10 mg total) daily for 2 days. Start taking on: Jul 26, 2020 What changed:  medication strength  See the new instructions.   Vitamin D3 50 MCG (2000 UT) Tabs Take 2,000 Units by mouth daily. What changed: Another medication with the same name was removed. Continue taking this medication, and follow the directions you see here.       Contact information for after-discharge care    Destination    [facility] .   Service: Skilled Nursing Contact information: [place] [phone]                 Allergies  Allergen Reactions  . Penicillins Hives    Has patient had a PCN reaction causing immediate rash, facial/tongue/throat swelling, SOB or lightheadedness with hypotension: Yes Has  patient had a PCN reaction causing severe rash involving mucus membranes or skin necrosis: No Has patient had a PCN reaction that required hospitalization: No Has patient had a PCN reaction occurring within the last 10 years: No If all of the above answers are "NO", then may proceed with Cephalosporin use. Patient has tolerated ceftriaxone in the recent past.  . Sulfamethoxazole-Trimethoprim Nausea And Vomiting and Other (See Comments)    Stomach problems  . Meperidine Other (See Comments)    Cramping  . Meperidine Hcl Other (See Comments)    Cramping   . Penicillin G Sodium Hives  . Sulfa Antibiotics Nausea And Vomiting and Other (See Comments)    Stomach problems/GI upset  . Sulfonamide Derivatives Nausea And Vomiting and Other (See Comments)    "Stomach problems"    Procedures/Studies: CT HEAD WO CONTRAST  Result Date: 07/24/2020 CLINICAL DATA:  Delirium EXAM: CT HEAD WITHOUT CONTRAST TECHNIQUE: Contiguous axial images were obtained from the base of the skull through the vertex without intravenous contrast. COMPARISON:  12/28/2004 FINDINGS: Brain: There is no mass, hemorrhage or extra-axial collection. There is generalized atrophy without lobar predilection. Hypodensity of the white matter is most commonly associated with chronic microvascular disease. Vascular: No abnormal hyperdensity of the major intracranial arteries or dural venous sinuses. No intracranial atherosclerosis. Skull: The visualized skull base, calvarium and extracranial soft tissues are normal. Sinuses/Orbits: No fluid levels or advanced mucosal thickening of the visualized paranasal sinuses. No mastoid or middle ear effusion. The orbits are normal. IMPRESSION: Generalized atrophy and chronic microvascular ischemia without acute intracranial abnormality. Electronically Signed   By: Ulyses Jarred M.D.   On: 07/24/2020 21:52   US RENAL  Result Date: 07/25/2020 CLINICAL DATA:  Acute renal insufficiency EXAM: RENAL /  URINARY TRACT ULTRASOUND COMPLETE COMPARISON:  None. FINDINGS: Right Kidney: Renal measurements: 9.1 x 4.8 x 4.1 cm = volume: 95 mL. Echogenicity within normal limits. No mass or hydronephrosis visualized. 11 mm simple cortical cyst is seen arising exophytically from the upper pole. Left Kidney: Renal measurements: 9.9 x 4.7 x 4.1 cm = volume: 98 mL. Echogenicity within normal limits. No mass or hydronephrosis visualized. 8 mm simple cortical cyst is seen arising exophytically from the interpolar region of the left kidney. Bladder: Appears normal for degree of bladder distention. Bilateral ureteral jets are identified. Other: None. IMPRESSION: Normal renal sonogram. Electronically Signed   By: Fidela Salisbury MD   On: 07/25/2020 04:36   ECHOCARDIOGRAM COMPLETE  Result Date: 07/22/2020    ECHOCARDIOGRAM REPORT   Patient Name:   SHU LACOCK Date of Exam: 07/22/2020 Medical Rec #:  [id number]   Height:       66.0 in Accession #:    [id number]  Weight:  173.1 lb Date of Birth:  [birth year]   BSA:          1.881 m Patient Age:    19 years    BP:           114/44 mmHg Patient Gender: F           HR:           92 bpm. Exam Location:  Inpatient Procedure: 2D Echo, Cardiac Doppler, Color Doppler and Intracardiac            Opacification Agent Indications:    I26.02 Pulmonary embolus  History:        Patient has prior history of Echocardiogram examinations, most                 recent 01/18/2018. Signs/Symptoms:Shortness of Breath and Chest                 Pain.  Sonographer:    Merrie Roof Referring Phys: [id number] [place]  1. Left ventricular ejection fraction, by estimation, is 55 to 60%. The left ventricle has normal function. The left ventricle has no regional wall motion abnormalities. Left ventricular diastolic parameters are consistent with Grade I diastolic dysfunction (impaired relaxation). Elevated left ventricular end-diastolic pressure.  2. Right ventricular systolic function is normal.  The right ventricular size is normal. Tricuspid regurgitation signal is inadequate for assessing PA pressure.  3. The mitral valve is normal in structure. No evidence of mitral valve regurgitation. No evidence of mitral stenosis.  4. The aortic valve is tricuspid. Aortic valve regurgitation is not visualized. Mild aortic valve stenosis. Aortic valve mean gradient measures 9.8 mmHg. Aortic valve Vmax measures 2.10 m/s.  5. The inferior vena cava is normal in size with greater than 50% respiratory variability, suggesting right atrial pressure of 3 mmHg. FINDINGS  Left Ventricle: Left ventricular ejection fraction, by estimation, is 55 to 60%. The left ventricle has normal function. The left ventricle has no regional wall motion abnormalities. Definity contrast agent was given IV to delineate the left ventricular  endocardial borders. The left ventricular internal cavity size was normal in size. There is no left ventricular hypertrophy. Abnormal (paradoxical) septal motion, consistent with left bundle branch block. Left ventricular diastolic parameters are consistent with Grade I diastolic dysfunction (impaired relaxation). Elevated left ventricular end-diastolic pressure. Right Ventricle: The right ventricular size is normal. No increase in right ventricular wall thickness. Right ventricular systolic function is normal. Tricuspid regurgitation signal is inadequate for assessing PA pressure. Left Atrium: Left atrial size was normal in size. Right Atrium: Right atrial size was normal in size. Pericardium: There is no evidence of pericardial effusion. Mitral Valve: The mitral valve is normal in structure. Mild to moderate mitral annular calcification. No evidence of mitral valve regurgitation. No evidence of mitral valve stenosis. Tricuspid Valve: The tricuspid valve is normal in structure. Tricuspid valve regurgitation is trivial. No evidence of tricuspid stenosis. Aortic Valve: The aortic valve is tricuspid. Aortic  valve regurgitation is not visualized. Mild aortic stenosis is present. Aortic valve mean gradient measures 9.8 mmHg. Aortic valve peak gradient measures 17.6 mmHg. Aortic valve area, by VTI measures 1.21 cm. Pulmonic Valve: The pulmonic valve was normal in structure. Pulmonic valve regurgitation is not visualized. No evidence of pulmonic stenosis. Aorta: The aortic root is normal in size and structure. Venous: The inferior vena cava is normal in size with greater than 50% respiratory variability, suggesting right atrial pressure of 3 mmHg. IAS/Shunts: No atrial  level shunt detected by color flow Doppler.  LEFT VENTRICLE PLAX 2D LVIDd:         4.20 cm     Diastology LVIDs:         2.90 cm     LV e' medial:    5.55 cm/s LV PW:         0.80 cm     LV E/e' medial:  16.2 LV IVS:        0.90 cm     LV e' lateral:   6.52 cm/s LVOT diam:     1.80 cm     LV E/e' lateral: 13.8 LV SV:         40 LV SV Index:   21 LVOT Area:     2.54 cm  LV Volumes (MOD) LV vol d, MOD A2C: 90.0 ml LV vol d, MOD A4C: 84.2 ml LV vol s, MOD A2C: 51.2 ml LV vol s, MOD A4C: 37.1 ml LV SV MOD A2C:     38.8 ml LV SV MOD A4C:     84.2 ml LV SV MOD BP:      44.1 ml RIGHT VENTRICLE          IVC RV Basal diam:  3.10 cm  IVC diam: 1.40 cm LEFT ATRIUM           Index       RIGHT ATRIUM           Index LA diam:      2.50 cm 1.33 cm/m  RA Area:     15.00 cm LA Vol (A2C): 56.0 ml 29.78 ml/m RA Volume:   37.20 ml  19.78 ml/m LA Vol (A4C): 42.4 ml 22.55 ml/m  AORTIC VALVE AV Area (Vmax):    1.15 cm AV Area (Vmean):   1.11 cm AV Area (VTI):     1.21 cm AV Vmax:           209.75 cm/s AV Vmean:          144.500 cm/s AV VTI:            0.333 m AV Peak Grad:      17.6 mmHg AV Mean Grad:      9.8 mmHg LVOT Vmax:         94.90 cm/s LVOT Vmean:        63.000 cm/s LVOT VTI:          0.158 m LVOT/AV VTI ratio: 0.48  AORTA Ao Root diam: 3.10 cm Ao Asc diam:  3.00 cm MITRAL VALVE MV Area (PHT): 3.06 cm     SHUNTS MV Decel Time: 248 msec     Systemic VTI:  0.16 m  MV E velocity: 90.00 cm/s   Systemic Diam: 1.80 cm MV A velocity: 111.00 cm/s MV E/A ratio:  0.81 Fransico Him MD Electronically signed by Fransico Him MD Signature Date/Time: 07/22/2020/12:56:32 PM    Final    VAS [place] LOWER EXTREMITY VENOUS (DVT)  Result Date: 07/22/2020  Lower Venous DVT Study Patient Name:  MARIUM RAGAN  Date of Exam:   07/22/2020 Medical Rec #: [id number]    Accession #:    [id number] Date of Birth: 1940/11/19    Patient Gender: F Patient Age:   79Y Exam Location:  [facility] Procedure:      VAS [place] LOWER EXTREMITY VENOUS (DVT) Referring Phys: [id number] VASUNDHRA RATHORE --------------------------------------------------------------------------------  Indications: PE.  Comparison Study: Previous exam 05/31/20 - negative Performing Technologist: Jeral Fruit  Hill  Examination Guidelines: A complete evaluation includes B-mode imaging, spectral Doppler, color Doppler, and power Doppler as needed of all accessible portions of each vessel. Bilateral testing is considered an integral part of a complete examination. Limited examinations for reoccurring indications may be performed as noted. The reflux portion of the exam is performed with the patient in reverse Trendelenburg.  +---------+---------------+---------+-----------+----------+-------------------+ RIGHT    CompressibilityPhasicitySpontaneityPropertiesThrombus Aging      +---------+---------------+---------+-----------+----------+-------------------+ CFV      Full           Yes      Yes                                      +---------+---------------+---------+-----------+----------+-------------------+ SFJ      Full                                                             +---------+---------------+---------+-----------+----------+-------------------+ FV Prox  Full           Yes      Yes                                      +---------+---------------+---------+-----------+----------+-------------------+ FV Mid    Full           Yes      Yes                                      +---------+---------------+---------+-----------+----------+-------------------+ FV DistalFull           Yes      Yes                                      +---------+---------------+---------+-----------+----------+-------------------+ PFV      Full                                                             +---------+---------------+---------+-----------+----------+-------------------+ POP      Full           Yes      Yes                                      +---------+---------------+---------+-----------+----------+-------------------+ PTV      Full                                                             +---------+---------------+---------+-----------+----------+-------------------+ PERO     Full  Not well visualized +---------+---------------+---------+-----------+----------+-------------------+   +---------+---------------+---------+-----------+----------+-------------------+ LEFT     CompressibilityPhasicitySpontaneityPropertiesThrombus Aging      +---------+---------------+---------+-----------+----------+-------------------+ CFV      Full           Yes      Yes                                      +---------+---------------+---------+-----------+----------+-------------------+ SFJ      Full                                                             +---------+---------------+---------+-----------+----------+-------------------+ FV Prox  Full           Yes      Yes                                      +---------+---------------+---------+-----------+----------+-------------------+ FV Mid   Full           Yes      Yes                                      +---------+---------------+---------+-----------+----------+-------------------+ FV DistalFull           Yes      Yes                                       +---------+---------------+---------+-----------+----------+-------------------+ PFV      Full                                                             +---------+---------------+---------+-----------+----------+-------------------+ POP      Full           Yes      Yes                                      +---------+---------------+---------+-----------+----------+-------------------+ PTV      Full                                         Not well visualized +---------+---------------+---------+-----------+----------+-------------------+ PERO     Full                                         Not well visualized +---------+---------------+---------+-----------+----------+-------------------+     Summary: BILATERAL: - No evidence of deep vein thrombosis seen in the lower extremities, bilaterally. - No evidence of superficial venous thrombosis in the lower extremities, bilaterally. -No evidence of popliteal cyst, bilaterally.   *See table(s) above  for measurements and observations. Electronically signed by Servando Snare MD on 07/22/2020 at 4:05:12 PM.    Final     Subjective: Without complaints this AM  Discharge Exam: Vitals:   07/26/20 1123 07/26/20 1512  BP: 122/64 115/63  Pulse: 83 85  Resp: 19 20  Temp: (!) 97.5 F (36.4 C) 98.3 F (36.8 C)  SpO2: 95% 94%   Vitals:   07/26/20 0727 07/26/20 0845 07/26/20 1123 07/26/20 1512  BP: 130/73  122/64 115/63  Pulse: 83  83 85  Resp: 17  19 20   Temp: (!) 97.5 F (36.4 C)  (!) 97.5 F (36.4 C) 98.3 F (36.8 C)  TempSrc: Oral  Oral Oral  SpO2: 97% 95% 95% 94%  Weight:      Height:        General: Pt is alert, awake, not in acute distress Cardiovascular: RRR, S1/S2 +, no rubs, no gallops Respiratory: CTA bilaterally, no wheezing, no rhonchi Abdominal: Soft, NT, ND, bowel sounds + Extremities: no edema, no cyanosis   The results of significant diagnostics from this hospitalization (including imaging, microbiology,  ancillary and laboratory) are listed below for reference.     Microbiology: Recent Results (from the past 240 hour(s))  Culture, blood (routine x 2)     Status: None (Preliminary result)   Collection Time: 07/24/20  6:11 PM   Specimen: BLOOD  Result Value Ref Range Status   Specimen Description BLOOD RIGHT WRIST  Final   Special Requests   Final    BOTTLES DRAWN AEROBIC AND ANAEROBIC Blood Culture adequate volume   Culture   Final    NO GROWTH 2 DAYS Performed at [facility], [address], [place], [place] [address]    Report Status PENDING  Incomplete  Culture, blood (routine x 2)     Status: Abnormal (Preliminary result)   Collection Time: 07/24/20  6:11 PM   Specimen: BLOOD RIGHT HAND  Result Value Ref Range Status   Specimen Description BLOOD RIGHT HAND  Final   Special Requests   Final    BOTTLES DRAWN AEROBIC AND ANAEROBIC Blood Culture adequate volume   Culture  Setup Time   Final    GRAM POSITIVE COCCI IN CLUSTERS AEROBIC BOTTLE ONLY CRITICAL RESULT CALLED TO, READ BACK BY AND VERIFIED WITHLaurice Record [facility] 2115 07/25/20 A BROWNING Performed at [facility], [place] [address] [place], [place] [address]    Culture STAPHYLOCOCCUS EPIDERMIDIS (A)  Final   Report Status PENDING  Incomplete  Blood Culture ID Panel (Reflexed)     Status: Abnormal   Collection Time: 07/24/20  6:11 PM  Result Value Ref Range Status   Enterococcus faecalis NOT DETECTED NOT DETECTED Final   Enterococcus Faecium NOT DETECTED NOT DETECTED Final   Listeria monocytogenes NOT DETECTED NOT DETECTED Final   Staphylococcus species DETECTED (A) NOT DETECTED Final    Comment: CRITICAL RESULT CALLED TO, READ BACK BY AND VERIFIED WITH: C PIERCE PHARMD 2115 07/25/20 A BROWNING    Staphylococcus aureus (BCID) NOT DETECTED NOT DETECTED Final   Staphylococcus epidermidis DETECTED (A) NOT DETECTED Final    Comment: CRITICAL RESULT CALLED TO, READ BACK BY AND VERIFIED WITH: C PIERCE PHARMD 2115  07/25/20 A BROWNING    Staphylococcus lugdunensis NOT DETECTED NOT DETECTED Final   Streptococcus species NOT DETECTED NOT DETECTED Final   Streptococcus agalactiae NOT DETECTED NOT DETECTED Final   Streptococcus pneumoniae NOT DETECTED NOT DETECTED Final   Streptococcus pyogenes NOT DETECTED  NOT DETECTED Final   A.calcoaceticus-baumannii NOT DETECTED NOT DETECTED Final   Bacteroides fragilis NOT DETECTED NOT DETECTED Final   Enterobacterales NOT DETECTED NOT DETECTED Final   Enterobacter cloacae complex NOT DETECTED NOT DETECTED Final   Escherichia coli NOT DETECTED NOT DETECTED Final   Klebsiella aerogenes NOT DETECTED NOT DETECTED Final   Klebsiella oxytoca NOT DETECTED NOT DETECTED Final   Klebsiella pneumoniae NOT DETECTED NOT DETECTED Final   Proteus species NOT DETECTED NOT DETECTED Final   Salmonella species NOT DETECTED NOT DETECTED Final   Serratia marcescens NOT DETECTED NOT DETECTED Final   Haemophilus influenzae NOT DETECTED NOT DETECTED Final   Neisseria meningitidis NOT DETECTED NOT DETECTED Final   Pseudomonas aeruginosa NOT DETECTED NOT DETECTED Final   Stenotrophomonas maltophilia NOT DETECTED NOT DETECTED Final   Candida albicans NOT DETECTED NOT DETECTED Final   Candida auris NOT DETECTED NOT DETECTED Final   Candida glabrata NOT DETECTED NOT DETECTED Final   Candida krusei NOT DETECTED NOT DETECTED Final   Candida parapsilosis NOT DETECTED NOT DETECTED Final   Candida tropicalis NOT DETECTED NOT DETECTED Final   Cryptococcus neoformans/gattii NOT DETECTED NOT DETECTED Final   Methicillin resistance mecA/C NOT DETECTED NOT DETECTED Final    Comment: Performed at [facility], [address], [place], [place] [address]  Resp Panel by RT-PCR (Flu A&B, Covid) Nasopharyngeal Swab     Status: None   Collection Time: 07/26/20 10:15 AM   Specimen: Nasopharyngeal Swab; Nasopharyngeal(NP) swabs in vial transport medium  Result Value Ref Range Status   SARS  Coronavirus 2 by RT PCR NEGATIVE NEGATIVE Final    Comment: (NOTE) SARS-CoV-2 target nucleic acids are NOT DETECTED.  The SARS-CoV-2 RNA is generally detectable in upper respiratory specimens during the acute phase of infection. The lowest concentration of SARS-CoV-2 viral copies this assay can detect is 138 copies/mL. A negative result does not preclude SARS-Cov-2 infection and should not be used as the sole basis for treatment or other patient management decisions. A negative result may occur with  improper specimen collection/handling, submission of specimen other than nasopharyngeal swab, presence of viral mutation(s) within the areas targeted by this assay, and inadequate number of viral copies(<138 copies/mL). A negative result must be combined with clinical observations, patient history, and epidemiological information. The expected result is Negative.  Fact Sheet for Patients:  EntrepreneurPulse.com.au  Fact Sheet for Healthcare Providers:  IncredibleEmployment.be  This test is no t yet approved or cleared by the [place] FDA and  has been authorized for detection and/or diagnosis of SARS-CoV-2 by FDA under an Emergency Use Authorization (EUA). This EUA will remain  in effect (meaning this test can be used) for the duration of the COVID-19 declaration under Section 564(b)(1) of the Act, 21 U.S.C.section 360bbb-3(b)(1), unless the authorization is terminated  or revoked sooner.       Influenza A by PCR NEGATIVE NEGATIVE Final   Influenza B by PCR NEGATIVE NEGATIVE Final    Comment: (NOTE) The Xpert Xpress SARS-CoV-2/FLU/RSV plus assay is intended as an aid in the diagnosis of influenza from Nasopharyngeal swab specimens and should not be used as a sole basis for treatment. Nasal washings and aspirates are unacceptable for Xpert Xpress SARS-CoV-2/FLU/RSV testing.  Fact Sheet for  Patients: EntrepreneurPulse.com.au  Fact Sheet for Healthcare Providers: IncredibleEmployment.be  This test is not yet approved or cleared by the [place] FDA and has been authorized for detection and/or diagnosis of SARS-CoV-2 by FDA under an Emergency Use  Authorization (EUA). This EUA will remain in effect (meaning this test can be used) for the duration of the COVID-19 declaration under Section 564(b)(1) of the Act, 21 U.S.C. section 360bbb-3(b)(1), unless the authorization is terminated or revoked.  Performed at [facility], [place] [address] [place], [place] [address]      Labs: BNP (last 3 results) Recent Labs    05/22/20 1800 07/21/20 2341  BNP 74.1 123456   Basic Metabolic Panel: Recent Labs  Lab 07/23/20 0348 07/24/20 0225 07/24/20 1811 07/24/20 2347  NA 135 134* 133* 133*  K 4.2 4.7 5.4* 4.7  CL 106 105 104 105  CO2 20* 20* 21* 21*  GLUCOSE 148* 128* 232* 237*  BUN 15 19 22 23   CREATININE 0.85 1.31* 1.05* 1.01*  CALCIUM 8.3* 8.1* 7.8* 7.6*   Liver Function Tests: No results for input(s): AST, ALT, ALKPHOS, BILITOT, PROT, ALBUMIN in the last 168 hours. No results for input(s): LIPASE, AMYLASE in the last 168 hours. No results for input(s): AMMONIA in the last 168 hours. CBC: Recent Labs  Lab 07/22/20 1959 07/23/20 0348 07/24/20 0225 07/25/20 0723  WBC 14.0* 11.4* 15.1* 10.8*  HGB 9.6* 9.8* 9.5* 9.4*  HCT 31.7* 33.7* 31.8* 31.8*  MCV 80.1 81.6 81.1 79.9*  PLT 406* 372 397 410*   Cardiac Enzymes: No results for input(s): CKTOTAL, CKMB, CKMBINDEX, TROPONINI in the last 168 hours. BNP: Invalid input(s): POCBNP CBG: Recent Labs  Lab 07/25/20 1533 07/25/20 2140 07/26/20 0724 07/26/20 1125 07/26/20 1514  GLUCAP 252* 263* 167* 227* 260*   D-Dimer No results for input(s): DDIMER in the last 72 hours. Hgb A1c No results for input(s): HGBA1C in the last 72 hours. Lipid Profile No results for  input(s): CHOL, HDL, LDLCALC, TRIG, CHOLHDL, LDLDIRECT in the last 72 hours. Thyroid function studies No results for input(s): TSH, T4TOTAL, T3FREE, THYROIDAB in the last 72 hours.  Invalid input(s): FREET3 Anemia work up No results for input(s): VITAMINB12, FOLATE, FERRITIN, TIBC, IRON, RETICCTPCT in the last 72 hours. Urinalysis    Component Value Date/Time   COLORURINE AMBER (A) 07/24/2020 1102   APPEARANCEUR CLOUDY (A) 07/24/2020 1102   LABSPEC 1.030 07/24/2020 1102   PHURINE 5.0 07/24/2020 1102   GLUCOSEU NEGATIVE 07/24/2020 1102   GLUCOSEU NEGATIVE 12/24/2016 1239   HGBUR NEGATIVE 07/24/2020 1102   BILIRUBINUR NEGATIVE 07/24/2020 1102   KETONESUR 5 (A) 07/24/2020 1102   PROTEINUR 30 (A) 07/24/2020 1102   UROBILINOGEN 0.2 12/24/2016 1239   NITRITE NEGATIVE 07/24/2020 1102   LEUKOCYTESUR NEGATIVE 07/24/2020 1102   Sepsis Labs Invalid input(s): PROCALCITONIN,  WBC,  LACTICIDVEN Microbiology Recent Results (from the past 240 hour(s))  Culture, blood (routine x 2)     Status: None (Preliminary result)   Collection Time: 07/24/20  6:11 PM   Specimen: BLOOD  Result Value Ref Range Status   Specimen Description BLOOD RIGHT WRIST  Final   Special Requests   Final    BOTTLES DRAWN AEROBIC AND ANAEROBIC Blood Culture adequate volume   Culture   Final    NO GROWTH 2 DAYS Performed at [facility], [place] [address] [place], [place] [address]    Report Status PENDING  Incomplete  Culture, blood (routine x 2)     Status: Abnormal (Preliminary result)   Collection Time: 07/24/20  6:11 PM   Specimen: BLOOD RIGHT HAND  Result Value Ref Range Status   Specimen Description BLOOD RIGHT HAND  Final   Special Requests   Final  BOTTLES DRAWN AEROBIC AND ANAEROBIC Blood Culture adequate volume   Culture  Setup Time   Final    GRAM POSITIVE COCCI IN CLUSTERS AEROBIC BOTTLE ONLY CRITICAL RESULT CALLED TO, READ BACK BY AND VERIFIED WITHLaurice Record [facility] 2115 07/25/20 A  BROWNING Performed at [facility], [place] [address] [place], [place] [address]    Culture STAPHYLOCOCCUS EPIDERMIDIS (A)  Final   Report Status PENDING  Incomplete  Blood Culture ID Panel (Reflexed)     Status: Abnormal   Collection Time: 07/24/20  6:11 PM  Result Value Ref Range Status   Enterococcus faecalis NOT DETECTED NOT DETECTED Final   Enterococcus Faecium NOT DETECTED NOT DETECTED Final   Listeria monocytogenes NOT DETECTED NOT DETECTED Final   Staphylococcus species DETECTED (A) NOT DETECTED Final    Comment: CRITICAL RESULT CALLED TO, READ BACK BY AND VERIFIED WITH: C PIERCE PHARMD 2115 07/25/20 A BROWNING    Staphylococcus aureus (BCID) NOT DETECTED NOT DETECTED Final   Staphylococcus epidermidis DETECTED (A) NOT DETECTED Final    Comment: CRITICAL RESULT CALLED TO, READ BACK BY AND VERIFIED WITH: C PIERCE PHARMD 2115 07/25/20 A BROWNING    Staphylococcus lugdunensis NOT DETECTED NOT DETECTED Final   Streptococcus species NOT DETECTED NOT DETECTED Final   Streptococcus agalactiae NOT DETECTED NOT DETECTED Final   Streptococcus pneumoniae NOT DETECTED NOT DETECTED Final   Streptococcus pyogenes NOT DETECTED NOT DETECTED Final   A.calcoaceticus-baumannii NOT DETECTED NOT DETECTED Final   Bacteroides fragilis NOT DETECTED NOT DETECTED Final   Enterobacterales NOT DETECTED NOT DETECTED Final   Enterobacter cloacae complex NOT DETECTED NOT DETECTED Final   Escherichia coli NOT DETECTED NOT DETECTED Final   Klebsiella aerogenes NOT DETECTED NOT DETECTED Final   Klebsiella oxytoca NOT DETECTED NOT DETECTED Final   Klebsiella pneumoniae NOT DETECTED NOT DETECTED Final   Proteus species NOT DETECTED NOT DETECTED Final   Salmonella species NOT DETECTED NOT DETECTED Final   Serratia marcescens NOT DETECTED NOT DETECTED Final   Haemophilus influenzae NOT DETECTED NOT DETECTED Final   Neisseria meningitidis NOT DETECTED NOT DETECTED Final   Pseudomonas aeruginosa NOT DETECTED  NOT DETECTED Final   Stenotrophomonas maltophilia NOT DETECTED NOT DETECTED Final   Candida albicans NOT DETECTED NOT DETECTED Final   Candida auris NOT DETECTED NOT DETECTED Final   Candida glabrata NOT DETECTED NOT DETECTED Final   Candida krusei NOT DETECTED NOT DETECTED Final   Candida parapsilosis NOT DETECTED NOT DETECTED Final   Candida tropicalis NOT DETECTED NOT DETECTED Final   Cryptococcus neoformans/gattii NOT DETECTED NOT DETECTED Final   Methicillin resistance mecA/C NOT DETECTED NOT DETECTED Final    Comment: Performed at [facility], [address] [place], [place] [address]  Resp Panel by RT-PCR (Flu A&B, Covid) Nasopharyngeal Swab     Status: None   Collection Time: 07/26/20 10:15 AM   Specimen: Nasopharyngeal Swab; Nasopharyngeal(NP) swabs in vial transport medium  Result Value Ref Range Status   SARS Coronavirus 2 by RT PCR NEGATIVE NEGATIVE Final    Comment: (NOTE) SARS-CoV-2 target nucleic acids are NOT DETECTED.  The SARS-CoV-2 RNA is generally detectable in upper respiratory specimens during the acute phase of infection. The lowest concentration of SARS-CoV-2 viral copies this assay can detect is 138 copies/mL. A negative result does not preclude SARS-Cov-2 infection and should not be used as the sole basis for treatment or other patient management decisions. A negative result may occur with  improper specimen collection/handling, submission  of specimen other than nasopharyngeal swab, presence of viral mutation(s) within the areas targeted by this assay, and inadequate number of viral copies(<138 copies/mL). A negative result must be combined with clinical observations, patient history, and epidemiological information. The expected result is Negative.  Fact Sheet for Patients:  EntrepreneurPulse.com.au  Fact Sheet for Healthcare Providers:  IncredibleEmployment.be  This test is no t yet approved or cleared by the  [place] FDA and  has been authorized for detection and/or diagnosis of SARS-CoV-2 by FDA under an Emergency Use Authorization (EUA). This EUA will remain  in effect (meaning this test can be used) for the duration of the COVID-19 declaration under Section 564(b)(1) of the Act, 21 U.S.C.section 360bbb-3(b)(1), unless the authorization is terminated  or revoked sooner.       Influenza A by PCR NEGATIVE NEGATIVE Final   Influenza B by PCR NEGATIVE NEGATIVE Final    Comment: (NOTE) The Xpert Xpress SARS-CoV-2/FLU/RSV plus assay is intended as an aid in the diagnosis of influenza from Nasopharyngeal swab specimens and should not be used as a sole basis for treatment. Nasal washings and aspirates are unacceptable for Xpert Xpress SARS-CoV-2/FLU/RSV testing.  Fact Sheet for Patients: EntrepreneurPulse.com.au  Fact Sheet for Healthcare Providers: IncredibleEmployment.be  This test is not yet approved or cleared by the [place] FDA and has been authorized for detection and/or diagnosis of SARS-CoV-2 by FDA under an Emergency Use Authorization (EUA). This EUA will remain in effect (meaning this test can be used) for the duration of the COVID-19 declaration under Section 564(b)(1) of the Act, 21 U.S.C. section 360bbb-3(b)(1), unless the authorization is terminated or revoked.  Performed at [facility], [place] [address] [place], [place] [address]    Time spent: 30 min  SIGNED:   Marylu Lund, MD  [facility] 07/26/2020, 3:42 PM  If 7PM-7AM, please contact night-coverage

## 2020-07-26 NOTE — Progress Notes (Signed)
ANTICOAGULATION CONSULT NOTE - Follow Up Consult  Pharmacy Consult for Heparin > Lovenox > Apixaban Indication: pulmonary embolus  Allergies  Allergen Reactions  . Penicillins Hives    Has patient had a PCN reaction causing immediate rash, facial/tongue/throat swelling, SOB or lightheadedness with hypotension: Yes Has patient had a PCN reaction causing severe rash involving mucus membranes or skin necrosis: No Has patient had a PCN reaction that required hospitalization: No Has patient had a PCN reaction occurring within the last 10 years: No If all of the above answers are "NO", then may proceed with Cephalosporin use. Patient has tolerated ceftriaxone in the recent past.  . Sulfamethoxazole-Trimethoprim Nausea And Vomiting and Other (See Comments)    Stomach problems  . Meperidine Other (See Comments)    Cramping  . Meperidine Hcl Other (See Comments)    Cramping   . Penicillin G Sodium Hives  . Sulfa Antibiotics Nausea And Vomiting and Other (See Comments)    Stomach problems/GI upset  . Sulfonamide Derivatives Nausea And Vomiting and Other (See Comments)    "Stomach problems"    Patient Measurements: Height: 5' 5.98" (167.6 cm) Weight: 78.5 kg (173 lb 1 oz) IBW/kg (Calculated) : 59.26  Vital Signs: Temp: 98.3 F (36.8 C) (05/18 1512) Temp Source: Oral (05/18 1512) BP: 115/63 (05/18 1512) Pulse Rate: 85 (05/18 1512)  Labs: Recent Labs    07/23/20 2056 07/24/20 0225 07/24/20 1124 07/24/20 1811 07/24/20 2049 07/24/20 2347 07/25/20 0723  HGB  --  9.5*  --   --   --   --  9.4*  HCT  --  31.8*  --   --   --   --  31.8*  PLT  --  397  --   --   --   --  410*  HEPARINUNFRC 0.40 0.26* 0.16*  --   --   --   --   CREATININE  --  1.31*  --  1.05*  --  1.01*  --   TROPONINIHS  --   --   --   --  13 11  --     Estimated Creatinine Clearance: 48.6 mL/min (A) (by C-G formula based on SCr of 1.01 mg/dL (H)).   Assessment: 80 yo F with bilateral PE diagnosed at  [facility].  Heparin level has been subtherapeutic X 2.  Per MD, patient was stable for transition to transition to Lovenox on 5/16 (pt's Lovenox regimen is 80 mg SQ Q 12 hrs, last dose was at 0744 AM today). Pt to be discharged today. Pharmacy has been consulted to transition pt to apixaban for PE treatment.  H/H 9.4/31.8, plt 410; Scr 1.01, TBW CrCl ~57 ml/min. Per RN, no bleeding issues observed.   Goal of Therapy:  Monitor platelets by anticoagulation protocol: Yes   Plan:  D/C Lovenox 80 mg SQ Q 12 hrs Start apixaban at 2000 this evening (at time next dose of Lovenox would have been due):  apixaban 10 mg PO BID X 7 days, followed by apixaban 5 mg PO BID Monitor CBC Monitor for bleeding Provide apixaban education  Gillermina Hu, PharmD, BCPS, [facility] Clinical Pharmacist 07/26/2020 3:15 PM

## 2020-07-27 ENCOUNTER — Other Ambulatory Visit: Payer: Self-pay | Admitting: *Deleted

## 2020-07-27 ENCOUNTER — Telehealth: Payer: Self-pay | Admitting: *Deleted

## 2020-07-27 LAB — CULTURE, BLOOD (ROUTINE X 2): Special Requests: ADEQUATE

## 2020-07-27 NOTE — Patient Outreach (Signed)
[facility]) Care Management  07/27/2020  [name] 12/01/1940 [id number]  CSW made contact with pt today who confirmed she is at [facility] "but not at all happy". CSW suggested pt talk to the staff about getting her moved to a facility closer to her home in [facility] Stressed to her the importance of her getting the skilled care she needs and not going home prematurely. Also, stressed to her the importance of remembering she has been most recently wanting and needing to be placed and thus a facility setting is essential for her care needs at this time.   CSW made call to SW at [facility] where pt was admitted last night and left a message- will attempt to brief her on pt.  CSW will sign off at this time as pt has been placed in facility.   Eduard Clos, MSW, [place] Worker  [place] [phone]

## 2020-07-27 NOTE — Patient Outreach (Signed)
[facility]) Care Management  07/27/2020  [name] October 01, 1940 [id number]   Member was admitted to hospital 5/13-5/18, diagnosed with PE.  She was discharged to [facility]  Per chart, member should be pursuing long term placement.  Post Acute Care coordinator notified of discharge.  Will close case at this time.  [name], [place], MSN [place] [phone]

## 2020-07-28 ENCOUNTER — Ambulatory Visit: Payer: Self-pay | Admitting: *Deleted

## 2020-07-30 LAB — CULTURE, BLOOD (ROUTINE X 2)
Culture: NO GROWTH
Special Requests: ADEQUATE

## 2020-07-31 ENCOUNTER — Ambulatory Visit: Payer: Medicare Other | Admitting: *Deleted

## 2020-07-31 DIAGNOSIS — I2699 Other pulmonary embolism without acute cor pulmonale: Secondary | ICD-10-CM | POA: Diagnosis not present

## 2020-07-31 DIAGNOSIS — J9601 Acute respiratory failure with hypoxia: Secondary | ICD-10-CM | POA: Diagnosis not present

## 2020-07-31 DIAGNOSIS — N179 Acute kidney failure, unspecified: Secondary | ICD-10-CM | POA: Diagnosis not present

## 2020-07-31 DIAGNOSIS — M069 Rheumatoid arthritis, unspecified: Secondary | ICD-10-CM | POA: Diagnosis not present

## 2020-08-04 DIAGNOSIS — G8929 Other chronic pain: Secondary | ICD-10-CM | POA: Diagnosis not present

## 2020-08-04 DIAGNOSIS — M0689 Other specified rheumatoid arthritis, multiple sites: Secondary | ICD-10-CM | POA: Diagnosis not present

## 2020-08-04 DIAGNOSIS — I2699 Other pulmonary embolism without acute cor pulmonale: Secondary | ICD-10-CM | POA: Diagnosis not present

## 2020-08-10 ENCOUNTER — Ambulatory Visit: Payer: Medicare Other | Admitting: Podiatry

## 2020-08-10 ENCOUNTER — Telehealth: Payer: Self-pay | Admitting: Pulmonary Disease

## 2020-08-10 NOTE — Telephone Encounter (Signed)
Called and left detailed message on VM (DPR), that I was reaching out to [facility] for images and report for CT chest.   Fax sheet sent to [facility] [phone].  Nothing further at this time.

## 2020-08-11 DIAGNOSIS — M069 Rheumatoid arthritis, unspecified: Secondary | ICD-10-CM | POA: Diagnosis not present

## 2020-08-11 DIAGNOSIS — G8929 Other chronic pain: Secondary | ICD-10-CM | POA: Diagnosis not present

## 2020-08-11 DIAGNOSIS — R5381 Other malaise: Secondary | ICD-10-CM | POA: Diagnosis not present

## 2020-08-11 DIAGNOSIS — I2699 Other pulmonary embolism without acute cor pulmonale: Secondary | ICD-10-CM | POA: Diagnosis not present

## 2020-08-12 DIAGNOSIS — E11618 Type 2 diabetes mellitus with other diabetic arthropathy: Secondary | ICD-10-CM | POA: Diagnosis not present

## 2020-08-12 DIAGNOSIS — J439 Emphysema, unspecified: Secondary | ICD-10-CM | POA: Diagnosis not present

## 2020-08-12 DIAGNOSIS — E1122 Type 2 diabetes mellitus with diabetic chronic kidney disease: Secondary | ICD-10-CM | POA: Diagnosis not present

## 2020-08-12 DIAGNOSIS — J84115 Respiratory bronchiolitis interstitial lung disease: Secondary | ICD-10-CM | POA: Diagnosis not present

## 2020-08-12 DIAGNOSIS — I2699 Other pulmonary embolism without acute cor pulmonale: Secondary | ICD-10-CM | POA: Diagnosis not present

## 2020-08-12 DIAGNOSIS — D849 Immunodeficiency, unspecified: Secondary | ICD-10-CM | POA: Diagnosis not present

## 2020-08-12 DIAGNOSIS — I9589 Other hypotension: Secondary | ICD-10-CM | POA: Diagnosis not present

## 2020-08-12 DIAGNOSIS — Z7901 Long term (current) use of anticoagulants: Secondary | ICD-10-CM | POA: Diagnosis not present

## 2020-08-12 DIAGNOSIS — D801 Nonfamilial hypogammaglobulinemia: Secondary | ICD-10-CM | POA: Diagnosis not present

## 2020-08-12 DIAGNOSIS — J45909 Unspecified asthma, uncomplicated: Secondary | ICD-10-CM | POA: Diagnosis not present

## 2020-08-12 DIAGNOSIS — L89151 Pressure ulcer of sacral region, stage 1: Secondary | ICD-10-CM | POA: Diagnosis not present

## 2020-08-12 DIAGNOSIS — Z794 Long term (current) use of insulin: Secondary | ICD-10-CM | POA: Diagnosis not present

## 2020-08-12 DIAGNOSIS — N1831 Chronic kidney disease, stage 3a: Secondary | ICD-10-CM | POA: Diagnosis not present

## 2020-08-12 DIAGNOSIS — Z7952 Long term (current) use of systemic steroids: Secondary | ICD-10-CM | POA: Diagnosis not present

## 2020-08-12 DIAGNOSIS — M797 Fibromyalgia: Secondary | ICD-10-CM | POA: Diagnosis not present

## 2020-08-12 DIAGNOSIS — G4733 Obstructive sleep apnea (adult) (pediatric): Secondary | ICD-10-CM | POA: Diagnosis not present

## 2020-08-12 DIAGNOSIS — K529 Noninfective gastroenteritis and colitis, unspecified: Secondary | ICD-10-CM | POA: Diagnosis not present

## 2020-08-12 DIAGNOSIS — D631 Anemia in chronic kidney disease: Secondary | ICD-10-CM | POA: Diagnosis not present

## 2020-08-12 DIAGNOSIS — G894 Chronic pain syndrome: Secondary | ICD-10-CM | POA: Diagnosis not present

## 2020-08-12 DIAGNOSIS — M810 Age-related osteoporosis without current pathological fracture: Secondary | ICD-10-CM | POA: Diagnosis not present

## 2020-08-12 DIAGNOSIS — M06072 Rheumatoid arthritis without rheumatoid factor, left ankle and foot: Secondary | ICD-10-CM | POA: Diagnosis not present

## 2020-08-12 DIAGNOSIS — Z7984 Long term (current) use of oral hypoglycemic drugs: Secondary | ICD-10-CM | POA: Diagnosis not present

## 2020-08-12 DIAGNOSIS — M06071 Rheumatoid arthritis without rheumatoid factor, right ankle and foot: Secondary | ICD-10-CM | POA: Diagnosis not present

## 2020-08-12 DIAGNOSIS — H903 Sensorineural hearing loss, bilateral: Secondary | ICD-10-CM | POA: Diagnosis not present

## 2020-08-12 DIAGNOSIS — Z87891 Personal history of nicotine dependence: Secondary | ICD-10-CM | POA: Diagnosis not present

## 2020-08-14 DIAGNOSIS — I2699 Other pulmonary embolism without acute cor pulmonale: Secondary | ICD-10-CM | POA: Diagnosis not present

## 2020-08-14 DIAGNOSIS — M06071 Rheumatoid arthritis without rheumatoid factor, right ankle and foot: Secondary | ICD-10-CM | POA: Diagnosis not present

## 2020-08-14 DIAGNOSIS — M06072 Rheumatoid arthritis without rheumatoid factor, left ankle and foot: Secondary | ICD-10-CM | POA: Diagnosis not present

## 2020-08-14 DIAGNOSIS — G894 Chronic pain syndrome: Secondary | ICD-10-CM | POA: Diagnosis not present

## 2020-08-14 DIAGNOSIS — J439 Emphysema, unspecified: Secondary | ICD-10-CM | POA: Diagnosis not present

## 2020-08-14 DIAGNOSIS — E11618 Type 2 diabetes mellitus with other diabetic arthropathy: Secondary | ICD-10-CM | POA: Diagnosis not present

## 2020-08-16 ENCOUNTER — Telehealth: Payer: Self-pay | Admitting: Pulmonary Disease

## 2020-08-16 DIAGNOSIS — M06071 Rheumatoid arthritis without rheumatoid factor, right ankle and foot: Secondary | ICD-10-CM | POA: Diagnosis not present

## 2020-08-16 DIAGNOSIS — J439 Emphysema, unspecified: Secondary | ICD-10-CM | POA: Diagnosis not present

## 2020-08-16 DIAGNOSIS — M06072 Rheumatoid arthritis without rheumatoid factor, left ankle and foot: Secondary | ICD-10-CM | POA: Diagnosis not present

## 2020-08-16 DIAGNOSIS — G894 Chronic pain syndrome: Secondary | ICD-10-CM | POA: Diagnosis not present

## 2020-08-16 DIAGNOSIS — I2699 Other pulmonary embolism without acute cor pulmonale: Secondary | ICD-10-CM | POA: Diagnosis not present

## 2020-08-16 DIAGNOSIS — E11618 Type 2 diabetes mellitus with other diabetic arthropathy: Secondary | ICD-10-CM | POA: Diagnosis not present

## 2020-08-16 NOTE — Telephone Encounter (Signed)
ATC Patient.  LM to call back. 

## 2020-08-17 DIAGNOSIS — Z794 Long term (current) use of insulin: Secondary | ICD-10-CM | POA: Diagnosis not present

## 2020-08-17 DIAGNOSIS — E119 Type 2 diabetes mellitus without complications: Secondary | ICD-10-CM | POA: Diagnosis not present

## 2020-08-17 DIAGNOSIS — R109 Unspecified abdominal pain: Secondary | ICD-10-CM | POA: Diagnosis not present

## 2020-08-17 DIAGNOSIS — K921 Melena: Secondary | ICD-10-CM | POA: Diagnosis not present

## 2020-08-17 DIAGNOSIS — Z79899 Other long term (current) drug therapy: Secondary | ICD-10-CM | POA: Diagnosis not present

## 2020-08-17 DIAGNOSIS — Z7952 Long term (current) use of systemic steroids: Secondary | ICD-10-CM | POA: Diagnosis not present

## 2020-08-17 DIAGNOSIS — R58 Hemorrhage, not elsewhere classified: Secondary | ICD-10-CM | POA: Diagnosis not present

## 2020-08-17 DIAGNOSIS — J9811 Atelectasis: Secondary | ICD-10-CM | POA: Diagnosis not present

## 2020-08-17 DIAGNOSIS — I2699 Other pulmonary embolism without acute cor pulmonale: Secondary | ICD-10-CM | POA: Diagnosis not present

## 2020-08-17 DIAGNOSIS — K5730 Diverticulosis of large intestine without perforation or abscess without bleeding: Secondary | ICD-10-CM | POA: Diagnosis not present

## 2020-08-17 DIAGNOSIS — M069 Rheumatoid arthritis, unspecified: Secondary | ICD-10-CM | POA: Diagnosis not present

## 2020-08-17 DIAGNOSIS — K625 Hemorrhage of anus and rectum: Secondary | ICD-10-CM | POA: Diagnosis not present

## 2020-08-17 DIAGNOSIS — I959 Hypotension, unspecified: Secondary | ICD-10-CM | POA: Diagnosis not present

## 2020-08-17 DIAGNOSIS — Z7901 Long term (current) use of anticoagulants: Secondary | ICD-10-CM | POA: Diagnosis not present

## 2020-08-17 DIAGNOSIS — D649 Anemia, unspecified: Secondary | ICD-10-CM | POA: Diagnosis not present

## 2020-08-17 NOTE — Telephone Encounter (Signed)
Called and spoke to pt. Pt states she was scripted neb meds by Dr. Silas Flood when she was d/c from hospital. Pt states her PCP was going to change those meds but pt was questioning if that was ok. Pt is seeing her PCP tomorrow so she states she will stay on the meds Dr. Garald Braver scripted and if her PCP wanted to change them to have him call our office to discuss this with Dr. Silas Flood. Pt has an upcoming appt with Dr. Silas Flood on 08/31/20. Pt verbalized understanding and denied any further questions or concerns at this time.

## 2020-08-19 ENCOUNTER — Inpatient Hospital Stay (HOSPITAL_COMMUNITY)
Admission: AD | Admit: 2020-08-19 | Discharge: 2020-08-22 | DRG: 377 | Disposition: A | Discharge status: Home Health | Payer: Medicare Other | Source: Other Acute Inpatient Hospital | Attending: Internal Medicine | Admitting: Internal Medicine

## 2020-08-19 ENCOUNTER — Other Ambulatory Visit: Payer: Self-pay

## 2020-08-19 ENCOUNTER — Encounter (HOSPITAL_COMMUNITY): Payer: Self-pay | Admitting: Internal Medicine

## 2020-08-19 DIAGNOSIS — Z8371 Family history of colonic polyps: Secondary | ICD-10-CM

## 2020-08-19 DIAGNOSIS — K219 Gastro-esophageal reflux disease without esophagitis: Secondary | ICD-10-CM | POA: Diagnosis not present

## 2020-08-19 DIAGNOSIS — Z993 Dependence on wheelchair: Secondary | ICD-10-CM

## 2020-08-19 DIAGNOSIS — Z6829 Body mass index (BMI) 29.0-29.9, adult: Secondary | ICD-10-CM

## 2020-08-19 DIAGNOSIS — K449 Diaphragmatic hernia without obstruction or gangrene: Secondary | ICD-10-CM | POA: Diagnosis present

## 2020-08-19 DIAGNOSIS — E088 Diabetes mellitus due to underlying condition with unspecified complications: Secondary | ICD-10-CM | POA: Diagnosis not present

## 2020-08-19 DIAGNOSIS — I2699 Other pulmonary embolism without acute cor pulmonale: Secondary | ICD-10-CM | POA: Diagnosis not present

## 2020-08-19 DIAGNOSIS — Z90710 Acquired absence of both cervix and uterus: Secondary | ICD-10-CM

## 2020-08-19 DIAGNOSIS — L89151 Pressure ulcer of sacral region, stage 1: Secondary | ICD-10-CM | POA: Diagnosis not present

## 2020-08-19 DIAGNOSIS — Z66 Do not resuscitate: Secondary | ICD-10-CM | POA: Diagnosis not present

## 2020-08-19 DIAGNOSIS — Z794 Long term (current) use of insulin: Secondary | ICD-10-CM

## 2020-08-19 DIAGNOSIS — Z96653 Presence of artificial knee joint, bilateral: Secondary | ICD-10-CM | POA: Diagnosis not present

## 2020-08-19 DIAGNOSIS — K921 Melena: Secondary | ICD-10-CM | POA: Diagnosis not present

## 2020-08-19 DIAGNOSIS — Z7952 Long term (current) use of systemic steroids: Secondary | ICD-10-CM

## 2020-08-19 DIAGNOSIS — K317 Polyp of stomach and duodenum: Secondary | ICD-10-CM | POA: Diagnosis not present

## 2020-08-19 DIAGNOSIS — M810 Age-related osteoporosis without current pathological fracture: Secondary | ICD-10-CM | POA: Diagnosis not present

## 2020-08-19 DIAGNOSIS — M797 Fibromyalgia: Secondary | ICD-10-CM | POA: Diagnosis present

## 2020-08-19 DIAGNOSIS — K31811 Angiodysplasia of stomach and duodenum with bleeding: Secondary | ICD-10-CM | POA: Diagnosis not present

## 2020-08-19 DIAGNOSIS — Z7901 Long term (current) use of anticoagulants: Secondary | ICD-10-CM

## 2020-08-19 DIAGNOSIS — Z87891 Personal history of nicotine dependence: Secondary | ICD-10-CM

## 2020-08-19 DIAGNOSIS — Z7951 Long term (current) use of inhaled steroids: Secondary | ICD-10-CM

## 2020-08-19 DIAGNOSIS — Z882 Allergy status to sulfonamides status: Secondary | ICD-10-CM

## 2020-08-19 DIAGNOSIS — K5730 Diverticulosis of large intestine without perforation or abscess without bleeding: Secondary | ICD-10-CM | POA: Diagnosis present

## 2020-08-19 DIAGNOSIS — Z79899 Other long term (current) drug therapy: Secondary | ICD-10-CM

## 2020-08-19 DIAGNOSIS — Z888 Allergy status to other drugs, medicaments and biological substances status: Secondary | ICD-10-CM

## 2020-08-19 DIAGNOSIS — E119 Type 2 diabetes mellitus without complications: Secondary | ICD-10-CM | POA: Diagnosis present

## 2020-08-19 DIAGNOSIS — K222 Esophageal obstruction: Secondary | ICD-10-CM | POA: Diagnosis not present

## 2020-08-19 DIAGNOSIS — Z800 Family history of malignant neoplasm of digestive organs: Secondary | ICD-10-CM

## 2020-08-19 DIAGNOSIS — E7849 Other hyperlipidemia: Secondary | ICD-10-CM | POA: Diagnosis not present

## 2020-08-19 DIAGNOSIS — G4730 Sleep apnea, unspecified: Secondary | ICD-10-CM | POA: Diagnosis present

## 2020-08-19 DIAGNOSIS — Z20822 Contact with and (suspected) exposure to covid-19: Secondary | ICD-10-CM | POA: Diagnosis present

## 2020-08-19 DIAGNOSIS — Z880 Allergy status to penicillin: Secondary | ICD-10-CM

## 2020-08-19 DIAGNOSIS — E785 Hyperlipidemia, unspecified: Secondary | ICD-10-CM | POA: Diagnosis present

## 2020-08-19 DIAGNOSIS — Z86711 Personal history of pulmonary embolism: Secondary | ICD-10-CM

## 2020-08-19 DIAGNOSIS — K922 Gastrointestinal hemorrhage, unspecified: Secondary | ICD-10-CM | POA: Diagnosis present

## 2020-08-19 DIAGNOSIS — R627 Adult failure to thrive: Secondary | ICD-10-CM | POA: Diagnosis present

## 2020-08-19 DIAGNOSIS — K623 Rectal prolapse: Secondary | ICD-10-CM | POA: Diagnosis not present

## 2020-08-19 DIAGNOSIS — D509 Iron deficiency anemia, unspecified: Secondary | ICD-10-CM | POA: Diagnosis not present

## 2020-08-19 DIAGNOSIS — K635 Polyp of colon: Secondary | ICD-10-CM | POA: Diagnosis not present

## 2020-08-19 DIAGNOSIS — J439 Emphysema, unspecified: Secondary | ICD-10-CM | POA: Diagnosis present

## 2020-08-19 DIAGNOSIS — Z833 Family history of diabetes mellitus: Secondary | ICD-10-CM

## 2020-08-19 DIAGNOSIS — M069 Rheumatoid arthritis, unspecified: Secondary | ICD-10-CM | POA: Diagnosis present

## 2020-08-19 HISTORY — DX: Other pulmonary embolism without acute cor pulmonale: I26.99

## 2020-08-19 LAB — COMPREHENSIVE METABOLIC PANEL
ALT: 10 U/L (ref 0–44)
AST: 11 U/L — ABNORMAL LOW (ref 15–41)
Albumin: 2.6 g/dL — ABNORMAL LOW (ref 3.5–5.0)
Alkaline Phosphatase: 65 U/L (ref 38–126)
Anion gap: 8 (ref 5–15)
BUN: 7 mg/dL — ABNORMAL LOW (ref 8–23)
CO2: 24 mmol/L (ref 22–32)
Calcium: 7.8 mg/dL — ABNORMAL LOW (ref 8.9–10.3)
Chloride: 106 mmol/L (ref 98–111)
Creatinine, Ser: 0.71 mg/dL (ref 0.44–1.00)
GFR, Estimated: >60 mL/min (ref >60)
Glucose, Bld: 154 mg/dL — ABNORMAL HIGH (ref 70–99)
Potassium: 3.6 mmol/L (ref 3.5–5.1)
Sodium: 138 mmol/L (ref 135–145)
Total Bilirubin: 0.7 mg/dL (ref 0.3–1.2)
Total Protein: 5.7 g/dL — ABNORMAL LOW (ref 6.5–8.1)

## 2020-08-19 LAB — CBC
HCT: 30.5 % — ABNORMAL LOW (ref 36.0–46.0)
Hemoglobin: 9 g/dL — ABNORMAL LOW (ref 12.0–15.0)
MCH: 24.5 pg — ABNORMAL LOW (ref 26.0–34.0)
MCHC: 29.5 g/dL — ABNORMAL LOW (ref 30.0–36.0)
MCV: 83.1 fL (ref 80.0–100.0)
Platelets: 324 10*3/uL (ref 150–400)
RBC: 3.67 MIL/uL — ABNORMAL LOW (ref 3.87–5.11)
RDW: 18.7 % — ABNORMAL HIGH (ref 11.5–15.5)
WBC: 9.2 10*3/uL (ref 4.0–10.5)
nRBC: 0 % (ref 0.0–0.2)

## 2020-08-19 LAB — MAGNESIUM: Magnesium: 1.5 mg/dL — ABNORMAL LOW (ref 1.7–2.4)

## 2020-08-19 LAB — GLUCOSE, CAPILLARY: Glucose-Capillary: 137 mg/dL — ABNORMAL HIGH (ref 70–99)

## 2020-08-19 LAB — PROTIME-INR
INR: 1.1 (ref 0.8–1.2)
Prothrombin Time: 13.8 seconds (ref 11.4–15.2)

## 2020-08-19 MED ORDER — ARFORMOTEROL TARTRATE 15 MCG/2ML IN NEBU
15.0000 ug | INHALATION_SOLUTION | Freq: Every day | RESPIRATORY_TRACT | Status: DC
  Administered 2020-08-20: 15 ug via RESPIRATORY_TRACT
  Filled 2020-08-19 (×3): qty 2

## 2020-08-19 MED ORDER — PREDNISONE 5 MG PO TABS
5.0000 mg | ORAL_TABLET | Freq: Every day | ORAL | Status: DC
  Administered 2020-08-20 – 2020-08-21 (×3): 5 mg via ORAL
  Filled 2020-08-19 (×3): qty 1

## 2020-08-19 MED ORDER — PANTOPRAZOLE SODIUM 40 MG IV SOLR
40.0000 mg | INTRAVENOUS | Status: DC
  Administered 2020-08-21 – 2020-08-22 (×2): 40 mg via INTRAVENOUS
  Filled 2020-08-19 (×2): qty 40

## 2020-08-19 MED ORDER — HEPARIN (PORCINE) 25000 UT/250ML-% IV SOLN
950.0000 [IU]/h | INTRAVENOUS | Status: DC
  Filled 2020-08-19: qty 250

## 2020-08-19 MED ORDER — ALBUTEROL SULFATE (2.5 MG/3ML) 0.083% IN NEBU
2.5000 mg | INHALATION_SOLUTION | Freq: Four times a day (QID) | RESPIRATORY_TRACT | Status: DC | PRN
  Administered 2020-08-20: 2.5 mg via RESPIRATORY_TRACT
  Filled 2020-08-19: qty 3

## 2020-08-19 MED ORDER — INSULIN ASPART 100 UNIT/ML IJ SOLN
0.0000 [IU] | Freq: Four times a day (QID) | INTRAMUSCULAR | Status: DC
  Administered 2020-08-20: 1 [IU] via SUBCUTANEOUS

## 2020-08-19 MED ORDER — ACETAMINOPHEN 650 MG RE SUPP: 650.0000 mg | Freq: Four times a day (QID) | RECTAL | Status: DC | PRN

## 2020-08-19 MED ORDER — ACETAMINOPHEN 325 MG PO TABS: 650.0000 mg | ORAL_TABLET | Freq: Four times a day (QID) | ORAL | Status: DC | PRN

## 2020-08-19 NOTE — H&P (Signed)
History and Physical    PLEASE NOTE THAT DRAGON DICTATION SOFTWARE WAS USED IN THE CONSTRUCTION OF THIS NOTE.   [name] OQH:[id number] DOB: 26-May-1940 DOA: 08/19/2020  PCP: [name], MD Patient coming from: [facility] ED  I have personally briefly reviewed patient's old medical records in [place]  Chief Complaint: Bright red blood per rectum  HPI: [name] is a 80 y.o. female with medical history significant for acute bilateral pulmonary embolism diagnosed in May 2022 subsequently anticoagulated on Eliquis, COPD, type 2 diabetes mellitus, rheumatoid arthritis on chronic low-dose prednisone therapy, GERD, chronic anemia with baseline hemoglobin 9-12, who is admitted to [facility] on 08/19/2020 by way of transfer from [facility] emergency department for further evaluation and management of suspected acute lower gastrointestinal bleeding after presenting from home to the latter facility on 08/17/2020 complaining of bright red blood per rectum.  The patient reports an episode of hematochezia on 08/16/2020, followed by a second episode of hematochezia on the morning of 08/17/2020, but noting a significant decrease in interval volume of bright red blood associated with the second episode.  However, in the context of 2 episodes of hematochezia within 24 hours, the patient presented to [facility] emergency department on 08/17/2020 for further evaluation and management of bright red blood per rectum.  While at [facility] ED, she experienced 1 episode of dark-colored stool on 08/17/2020, but subsequently notes no additional hematochezia or dark-colored stool following this episode in spite of multiple bowel movements since that time.  She denies any associated abdominal pain, nausea, vomiting, or preceding trauma.  She also denies any recent or associated chest pain, shortness of breath, palpitations, dizziness, diaphoresis presyncope, or syncope.  Denies any recent subjective fever,  chills, or rigors.  No history of alcohol abuse or recent alcohol consumption.  Denies any history of known underlying liver disease.  She reports a history of GERD for which she conveys good compliance on home Protonix.  She was diagnosed with acute bilateral pulmonary embolism in May 2022 at which time she was started on Eliquis.  She notes good interval compliance on this anticoagulant, with most recent dose of such occurring on the morning of 08/17/2020.  Otherwise, she denies use of any other blood thinners at home, including no aspirin.  Denies any use of NSAIDs as an outpatient.  In the setting of rheumatoid arthritis, she confirms that she is on chronic low-dose prednisone therapy with prednisone 5 mg p.o. nightly, with most recent dose occurring on the evening of 08/18/2020.  Otherwise, denies any use of bisphosphonates or supplemental potassium as an outpatient.  She confirms that her most recent colonoscopy occurred approximately 2 years ago and was performed by [facility] gastroenterology.   Upon arrival at [facility] emergency department on 08/17/2020, hemoglobin was found to be 9.2 relative to most recent prior value of 9.4 on 07/25/2020.  Eliquis was held following patient's home dose on the morning of 08/17/2020, with ensuing initiation of heparin drip on 08/18/2020, upon which she arrives today at [place] discussed the patient's case with the on-call [facility] gastroenterologist, Dr.Magod, Who agreed to see the patient in consultation upon her arrival at [place] discussed the patient's case with [facility] hospitalist the evening of 08/17/2020, at which time the patient was accepted for transfer for further evaluation and management of suspected acute lower gastrointestinal bleed.  However, [place] hospitalist at that time conveyed to EDP that there were  no beds available in the [facility] system at the time.  Patient noted to be hemodynamically stable in the  absence of any additional evidence of ongoing bleeding, patient was accepted for transfer bed becoming available.  The patient denies any known recent COVID-19 exposures.  She denies any acute urinary symptoms, including no recent dysuria, gross hematuria, or change in urinary urgency/frequency.  No recent hemoptysis.  Denies any recent orthopnea, PND, or worsening of peripheral edema.    Review of Systems: As per HPI otherwise 10 point review of systems negative.   Past Medical History:  Diagnosis Date   Asthma    Complication of anesthesia    Anesthesia told her years ago she got too cold during surgery   Diabetes mellitus without complication ([place])    Emphysema    Fibromyalgia    Osteoporosis    Rheumatoid arthritis(714.0)    Sleep apnea    no cpap    Past Surgical History:  Procedure Laterality Date   ABDOMINAL HYSTERECTOMY     ANKLE FRACTURE SURGERY  2007   BREAST SURGERY     mammoplasty and then removed   FOOT SURGERY     numerous   I & D EXTREMITY Right 10/26/2014   Procedure: IRRIGATION AND DEBRIDEMENT RIGHT KNEE ;  Surgeon: Gaynelle Arabian, MD;  Location: [facility];  Service: Orthopedics;  Laterality: Right;   IRRIGATION AND DEBRIDEMENT KNEE Right 03/09/2017   Procedure: IRRIGATION AND DEBRIDEMENT RIGHT KNEE PRE-PATELLAR BURSA;  Surgeon: Susa Day, MD;  Location: [place];  Service: Orthopedics;  Laterality: Right;   KNEE BURSECTOMY Right 09/16/2014   Procedure: EXCISON OF PRE PATELLA BURSA RIGHT KNEE ;  Surgeon: Gaynelle Arabian, MD;  Location: [facility];  Service: Orthopedics;  Laterality: Right;   PARTIAL HYSTERECTOMY  1970   TOTAL KNEE ARTHROPLASTY  2005 / 2006   x2    Social History:  reports that she quit smoking about 26 years ago. She has never used smokeless tobacco. She reports that she does not drink alcohol and does not use drugs.   Allergies  Allergen Reactions   Penicillins Hives    Has patient had a PCN reaction causing immediate rash, facial/tongue/throat  swelling, SOB or lightheadedness with hypotension: Yes Has patient had a PCN reaction causing severe rash involving mucus membranes or skin necrosis: No Has patient had a PCN reaction that required hospitalization: No Has patient had a PCN reaction occurring within the last 10 years: No If all of the above answers are "NO", then may proceed with Cephalosporin use. Patient has tolerated ceftriaxone in the recent past.   Sulfamethoxazole-Trimethoprim Nausea And Vomiting and Other (See Comments)    Stomach problems   Meperidine Other (See Comments)    Cramping   Meperidine Hcl Other (See Comments)    Cramping    Penicillin G Sodium Hives   Sulfa Antibiotics Nausea And Vomiting and Other (See Comments)    Stomach problems/GI upset   Sulfonamide Derivatives Nausea And Vomiting and Other (See Comments)    "Stomach problems"    Family History  Problem Relation Age of Onset   Hypertension Other    Diabetes Other    Stroke Other    Diabetes Mother    Diabetes Father    Diabetes Sister    Diabetes Maternal Aunt    Cancer Paternal Uncle    Cancer Paternal Grandmother    Breast cancer Paternal Grandmother     Family history reviewed and not pertinent    Prior  to Admission medications   Medication Sig Start Date End Date Taking? Authorizing Provider  acetaminophen (TYLENOL) 500 MG tablet Take 500-1,000 mg by mouth every 6 (six) hours as needed (for headaches).    [provider]  albuterol (VENTOLIN HFA) 108 (90 Base) MCG/ACT inhaler Inhale 2 puffs into the lungs every 6 (six) hours as needed for shortness of breath or wheezing. 07/05/19   [provider]  apixaban (ELIQUIS) 5 MG TABS tablet Take 2 tablets (10 mg total) by mouth 2 (two) times daily. 07/26/20   Donne Hazel, MD  apixaban (ELIQUIS) 5 MG TABS tablet Take 1 tablet (5 mg total) by mouth 2 (two) times daily. 08/02/20 09/01/20  Donne Hazel, MD  arformoterol (BROVANA) 15 MCG/2ML NEBU Take 2 mLs (15 mcg  total) by nebulization 2 (two) times daily. 05/26/20 06/25/20  [name], MD  atorvastatin (LIPITOR) 20 MG tablet Take 10 mg by mouth at bedtime.    [provider]  B-D UF III MINI PEN NEEDLES 31G X 5 MM MISC SMARTSIG:1 Each SUB-Q 4 Times Daily 10/22/19   [provider]  budesonide (PULMICORT) 0.25 MG/2ML nebulizer solution Take 2 mLs (0.25 mg total) by nebulization 2 (two) times daily. 05/26/20 06/25/20  [name], MD  Cholecalciferol (VITAMIN D3) 50 MCG (2000 UT) TABS Take 2,000 Units by mouth daily.    [provider]  Cyanocobalamin (B-12) 2500 MCG TABS Take 2,500 mcg by mouth at bedtime.     [provider]  DULoxetine (CYMBALTA) 60 MG capsule Take 60 mg by mouth at bedtime.     [provider]  ezetimibe (ZETIA) 10 MG tablet Take 10 mg by mouth at bedtime.  11/04/16   [provider]  insulin NPH-regular Human (70-30) 100 UNIT/ML injection Inject 14 Units into the skin 2 (two) times daily with a meal.    [provider]  ipratropium-albuterol (DUONEB) 0.5-2.5 (3) MG/3ML SOLN Take 3 mLs by nebulization every 6 (six) hours as needed. Patient not taking: Reported on 07/22/2020 07/19/20   [provider]  loperamide (IMODIUM A-D) 2 MG tablet Take 2 mg by mouth 4 (four) times daily as needed for diarrhea or loose stools.    [provider]  metFORMIN (GLUCOPHAGE) 500 MG tablet Take 1,000 mg by mouth at bedtime.    [provider]  Multiple Vitamins-Minerals (ONE-A-DAY WOMENS 50 PLUS) TABS Take 1 tablet by mouth daily with breakfast.    [provider]  MYRBETRIQ 50 MG TB24 tablet Take 50 mg by mouth at bedtime. 03/29/20   [provider]  Nebulizers (COMPRESSOR/NEBULIZER) MISC Nebulizer machine/mask/connector for asthma/emphysema 05/26/20   [name], MD  [facility] VERIO test strip  10/13/18   [provider]  oxyCODONE (OXY IR/ROXICODONE) 5 MG immediate release tablet Take 1 tablet (5 mg  total) by mouth every 6 (six) hours as needed for severe pain or moderate pain. 07/26/20   Donne Hazel, MD  pantoprazole (PROTONIX) 40 MG tablet Take one tablet once every morning Patient taking differently: Take 40 mg by mouth at bedtime. 05/26/20   [name], MD  predniSONE (DELTASONE) 5 MG tablet Take 5 mg by mouth at bedtime.    [provider]     Objective    Physical Exam: Vitals:   08/19/20 2207  BP: 124/65  Pulse: (!) 104  Resp: 20  Temp: 98.3 F (36.8 C)  TempSrc: Oral  SpO2: 96%    General: appears to be stated age;  alert, oriented Skin: warm, dry, no rash Head:  AT/[place] Mouth:  Oral mucosa membranes appear moist, normal dentition Neck: supple; trachea midline Heart:  RRR; did not appreciate any M/R/G Lungs: CTAB, did not appreciate any wheezes, rales, or rhonchi Abdomen: + BS; soft, ND, NT Vascular: 2+ pedal pulses b/l; 2+ radial pulses b/l Extremities: Trace edema in bilateral lower extremities, no muscle wasting Neuro: strength and sensation intact in upper and lower extremities b/l   Labs on Admission: I have personally reviewed following labs and imaging studies  CBC: No results for input(s): WBC, NEUTROABS, HGB, HCT, MCV, PLT in the last 168 hours. Basic Metabolic Panel: No results for input(s): NA, K, CL, CO2, GLUCOSE, BUN, CREATININE, CALCIUM, MG, PHOS in the last 168 hours. GFR: CrCl cannot be calculated (Patient's most recent lab result is older than the maximum 21 days allowed.). Liver Function Tests: No results for input(s): AST, ALT, ALKPHOS, BILITOT, PROT, ALBUMIN in the last 168 hours. No results for input(s): LIPASE, AMYLASE in the last 168 hours. No results for input(s): AMMONIA in the last 168 hours. Coagulation Profile: No results for input(s): INR, PROTIME in the last 168 hours. Cardiac Enzymes: No results for input(s): CKTOTAL, CKMB, CKMBINDEX, TROPONINI in the last 168 hours. BNP (last 3 results) No results for input(s):  PROBNP in the last 8760 hours. HbA1C: No results for input(s): HGBA1C in the last 72 hours. CBG: No results for input(s): GLUCAP in the last 168 hours. Lipid Profile: No results for input(s): CHOL, HDL, LDLCALC, TRIG, CHOLHDL, LDLDIRECT in the last 72 hours. Thyroid Function Tests: No results for input(s): TSH, T4TOTAL, FREET4, T3FREE, THYROIDAB in the last 72 hours. Anemia Panel: No results for input(s): VITAMINB12, FOLATE, FERRITIN, TIBC, IRON, RETICCTPCT in the last 72 hours. Urine analysis:    Component Value Date/Time   COLORURINE AMBER (A) 07/24/2020 1102   APPEARANCEUR CLOUDY (A) 07/24/2020 1102   LABSPEC 1.030 07/24/2020 1102   PHURINE 5.0 07/24/2020 1102   GLUCOSEU NEGATIVE 07/24/2020 [place] 12/24/2016 1239   HGBUR NEGATIVE 07/24/2020 1102   BILIRUBINUR NEGATIVE 07/24/2020 1102   KETONESUR 5 (A) 07/24/2020 1102   PROTEINUR 30 (A) 07/24/2020 1102   UROBILINOGEN 0.2 12/24/2016 1239   NITRITE NEGATIVE 07/24/2020 1102   LEUKOCYTESUR NEGATIVE 07/24/2020 1102    Radiological Exams on Admission: No results found.   Assessment/Plan   EMILE RINGGENBERG is a 80 y.o. female with medical history significant for acute bilateral pulmonary embolism diagnosed in May 2022 subsequently anticoagulated on Eliquis, COPD, type 2 diabetes mellitus, rheumatoid arthritis on chronic low-dose prednisone therapy, GERD, chronic anemia with baseline hemoglobin 9-12, who is admitted to [facility] on 08/19/2020 by way of transfer from [facility] emergency department for further evaluation and management of suspected acute lower gastrointestinal bleeding after presenting from home to the latter facility on 08/17/2020 complaining of bright red blood per rectum.   Principal Problem:   Acute lower GI bleeding Active Problems:   GERD   Rheumatoid arthritis ([place])   Diabetes mellitus due to underlying condition, controlled, with complication, without long-term current use of insulin  ([place])   Other hyperlipidemia   Acute pulmonary embolism ([place])      #) Acute lower GI bleed: diagnosis on the basis of 2 episodes of hematochezia over a 24-hour period from 08/16/2020 to 08/17/2020, with a second episode associated with declining volume with associated bright red blood, with ensuing episode of dark-colored stool later in the day on 08/17/2020, in the  absence of any ensuing hematochezia or melena.  Rectal exam performed at [facility] emergency department on 08/17/2020 reportedly Hemoccult stool positive.  Presenting hemoglobin on 08/17/2020 reported to be 9.2, presenting no significant change from most recent prior value of 9.4 on 07/25/2020.  BUN value at that time is currently unclear to me. EDP discussed cased with Dr. Watt Climes of [facility], who agreed to see patient upon arrival at [facility].   Patient was hemodynamically stable throughout her course at [facility] ED leading up to her transfer to [facility] for further evaluation and management of suspected acute lower gastrointestinal bleed, with the above symptoms, trend, hemodynamic stability appearing consistent with acute lower source of GI bleed as opposed to representing an upper GI bleed source.  Upon arrival at [facility] this evening, noted to be normotensive, with mild tachycardia with heart rates in the low 100s.  Patient completely asymptomatic at this time.  This is in the context of initiation of Eliquis in May 2022 for diagnosis of bilateral acute pulmonary embolism at that time.  Otherwise, not on any blood thinners as an outpatient, including no aspirin.  Most recent dose of Eliquis occurred on the morning of 08/17/2020, for subsequently being started on heparin drip, upon which the patient arrives this evening.  Given recent diagnosis of acute bilateral pulmonary embolism in setting of presenting acute lower gastrointestinal bleed with stable hemoglobin thus far and apparent hemodynamic stability, will continue heparin drip  for now pending further evaluation of suspected acute lower GI bleed.  Not on any NSAIDs as an outpatient.  Of note, in the setting of a history of chronic anemia with baseline hemoglobin 9-12, stat CBC performed upon arrival was associated with hemoglobin of 9.0.  This is relative to most recent prior hemoglobin value of 9.2 when checked at [facility] ED on 08/17/2020.  Therefore, patient's hemoglobin appears to be stable over the last several days, and consistent with a documented history of chronic anemia and associated baseline hemoglobin range.   No known history of liver failure to warrant SBP prophylaxis.  Differential includes diverticular bleed, colorectal malignancy, bleeding colonic polyps, AVMs, hemorrhoids.  While there is clinical suspicion of a lower gastrointestinal source for patient's acute GI bleed, it is notable that she is on chronic prednisone therapy in the setting of rheumatoid arthritis, increasing her risk for development of upper GI bleed.  Given suspected lower GI source, there does not appear to be an indication for initiation of Protonix drip.  No overt indication for PRBC transfusion at this time, but will closely monitor ensuing hemoglobin trend via every 4 hour H&H values as well as close monitoring blood pressure heart rate consistent with determination of subsequent need for blood transfusion.  Presenting INR noted to be 1.1.     Plan: NPO. Monitor on telemetry. Monitor continuous pulse-ox. Maintain at least 2 large bore IV's. Check INR in the AM. Q4H H&H's have been ordered through9AM on 08/20/20. Will closely monitor these ensuing Hgb levels and correlate these data points with the patient's overall clinical picture including vital signs to determine need for subsequent transfusion.  Continue heparin drip in the setting of recent diagnosis of acute bilateral pulmonary emboli, as above.  Noted inpatient pharmacy consult has been placed assistance with this in the absence of bolus  dosing.  Type and screen.  Stat CMP has been ordered upon arrival to [facility], with result currently pending.  Will repeat BMP in the morning as well as CBC  at that time.  [facility] to be notified of patient's arrival, with additional recs to follow.  Check EKG.       #) Acute bilateral pulmonary emboli: Diagnosed in May 2022, with initiation of Eliquis at that time, with patient getting good interval compliance.  Most recent dose of Eliquis occurred on the morning of 08/17/2020, with subsequent holding in the setting of suspected acute lower gastrointestinal bleed, with ensuing initiation of heparin drip, as further detailed above.  We will continue heparin drip for now, as further discussed above.  Patient appears hemodynamically stable at this time.  Plan: Inpatient pharmacy consult placed for assistance with continuation of heparin drip upon which patient arrives from [facility] emergency department this evening.  Continue to hold home Eliquis.  Repeat CBC in the morning.  Further evaluation management of suspected presenting acute lower gastrointestinal bleed, as above.      #) COPD: Documented history of such in the setting of the patient being a former smoker, having completely quit smoking greater than 25 years ago.  Outpatient respiratory regimen includes [place] as well as as needed albuterol.  No evidence to suggest acute exacerbation of underlying COPD at this time.  Plan: Monitor continuous pulse oximetry in the setting of suspected presenting acute lower gastrointestinal bleed.  Check serum phosphorus level.  Continue to improve on.  As needed albuterol inhaler.       #) Type 2 diabetes mellitus: Outpatient insulin regimen consists of 70/30 insulin, taking 14 units sq twice daily with meals.  Additionally, she is on metformin as an outpatient.  Complicating factors include chronic low-dose prednisone therapy in the setting of rheumatoid arthritis.  Plan: In the setting of  current n.p.o. status, I have ordered Accu-Cheks every 6 hours with low-dose sliding scale insulin, with next Accu-Chek to occur now.  Hold home 70/30 insulin as well as metformin for now.       #) Rheumatoid arthritis: On chronic low-dose corticosteroid therapy with prednisone 5 mg p.o. nightly, most recent dose of prednisone occurred on the evening of 08/18/2020.  Plan: Continue home low-dose prednisone, with next dose to occur now, following which patient will be n.p.o in the setting of presenting acute lower gastrointestinal bleed.Marland Kitchen        #) GERD: Patient confirms a history of such and notes associated good compliance with home Protonix 40 mg p.o. nightly.    Plan: In the setting of n.p.o. status, hold home oral Protonix.  Rather, I have ordered Protonix 40 mg IV nightly, with next dose to occur now.     #) Hyperlipidemia: On atorvastatin as an outpatient.  Plan: In setting of current n.p.o. status, will hold home atorvastatin for now.      #) Chronic anemia: Documented history of such with chart review revealing baseline hemoglobin range of 9-12.  Suspect that this is consistent with anemia of chronic disease. In setting of presenting suspected acute lower gastrointestinal bleed, hemoglobin has remained within this baseline range, with stat CBC upon arrival at [facility] long this evening demonstrating hemoglobin of 9.0 relative to most recent prior value of 9.2 on 08/17/2020.  This evening CBC demonstrates MCV 83 as well as hypochromic finding in addition to mildly elevated RDW.   Plan: Every 4 hour H&H monitoring has been ordered through 9 AM 08/20/2020 in the setting of suspected presenting acute lower gastrointestinal bleed, as further detailed above.  Repeat INR in the morning.  Repeat CBC in the morning.  DVT prophylaxis: on Heparin drip, as above.   Code Status: DNR/DNI Family Communication: none Disposition Plan: Per Rounding Team;  Consults called: Dr. Watt Climes of  [facility] has has agreed to see the patient in consultation, as further detailed above Admission status: Observation; med telemetry     Of note, this patient was added by me to the following Admit List/Treatment Team: [email or web address].      PLEASE NOTE THAT DRAGON DICTATION SOFTWARE WAS USED IN THE CONSTRUCTION OF THIS NOTE.   [place] [facility] Pager [phone] From [place]  Otherwise, please contact night-coverage  [email or web address] Password [facility]   08/19/2020, 10:22 PM

## 2020-08-19 NOTE — Progress Notes (Signed)
Brief note regarding plan, with full H&P to follow:  80 year old female 80 year old female with recently diagnosed acute bilateral pulmonary embolism, with ensuing initiation anticoagulation, who is admitted to [facility] transfer from [facility] emergency department for further evaluation management of suspected acute lower gastrointestinal bleed at presenting to the latter facility complaining of hematochezia.  [facility] gastroenterology agreed to formally consult.  Checking the following STAT labs upon arrival at [facility]: CMP, CBC, INR, serum magnesium level.  Will Pursue every 4 hour H&H trending overnight.  NPO.     Babs Bertin, DO Hospitalist

## 2020-08-19 NOTE — Plan of Care (Signed)

## 2020-08-20 DIAGNOSIS — K622 Anal prolapse: Secondary | ICD-10-CM | POA: Diagnosis not present

## 2020-08-20 DIAGNOSIS — K317 Polyp of stomach and duodenum: Secondary | ICD-10-CM | POA: Diagnosis not present

## 2020-08-20 DIAGNOSIS — D649 Anemia, unspecified: Secondary | ICD-10-CM | POA: Diagnosis not present

## 2020-08-20 DIAGNOSIS — K921 Melena: Secondary | ICD-10-CM | POA: Diagnosis not present

## 2020-08-20 DIAGNOSIS — D124 Benign neoplasm of descending colon: Secondary | ICD-10-CM | POA: Diagnosis not present

## 2020-08-20 DIAGNOSIS — K31819 Angiodysplasia of stomach and duodenum without bleeding: Secondary | ICD-10-CM | POA: Diagnosis not present

## 2020-08-20 DIAGNOSIS — M797 Fibromyalgia: Secondary | ICD-10-CM | POA: Diagnosis present

## 2020-08-20 DIAGNOSIS — E119 Type 2 diabetes mellitus without complications: Secondary | ICD-10-CM | POA: Diagnosis not present

## 2020-08-20 DIAGNOSIS — K31811 Angiodysplasia of stomach and duodenum with bleeding: Secondary | ICD-10-CM | POA: Diagnosis not present

## 2020-08-20 DIAGNOSIS — M069 Rheumatoid arthritis, unspecified: Secondary | ICD-10-CM | POA: Diagnosis not present

## 2020-08-20 DIAGNOSIS — K623 Rectal prolapse: Secondary | ICD-10-CM | POA: Diagnosis present

## 2020-08-20 DIAGNOSIS — Z87891 Personal history of nicotine dependence: Secondary | ICD-10-CM | POA: Diagnosis not present

## 2020-08-20 DIAGNOSIS — D509 Iron deficiency anemia, unspecified: Secondary | ICD-10-CM | POA: Diagnosis not present

## 2020-08-20 DIAGNOSIS — K222 Esophageal obstruction: Secondary | ICD-10-CM | POA: Diagnosis not present

## 2020-08-20 DIAGNOSIS — K922 Gastrointestinal hemorrhage, unspecified: Secondary | ICD-10-CM | POA: Diagnosis not present

## 2020-08-20 DIAGNOSIS — J439 Emphysema, unspecified: Secondary | ICD-10-CM | POA: Diagnosis not present

## 2020-08-20 DIAGNOSIS — Z96653 Presence of artificial knee joint, bilateral: Secondary | ICD-10-CM | POA: Diagnosis present

## 2020-08-20 DIAGNOSIS — Z66 Do not resuscitate: Secondary | ICD-10-CM | POA: Diagnosis not present

## 2020-08-20 DIAGNOSIS — M810 Age-related osteoporosis without current pathological fracture: Secondary | ICD-10-CM | POA: Diagnosis present

## 2020-08-20 DIAGNOSIS — I2699 Other pulmonary embolism without acute cor pulmonale: Secondary | ICD-10-CM | POA: Diagnosis not present

## 2020-08-20 DIAGNOSIS — E785 Hyperlipidemia, unspecified: Secondary | ICD-10-CM | POA: Diagnosis not present

## 2020-08-20 DIAGNOSIS — K5730 Diverticulosis of large intestine without perforation or abscess without bleeding: Secondary | ICD-10-CM | POA: Diagnosis not present

## 2020-08-20 DIAGNOSIS — G4730 Sleep apnea, unspecified: Secondary | ICD-10-CM | POA: Diagnosis present

## 2020-08-20 DIAGNOSIS — L89151 Pressure ulcer of sacral region, stage 1: Secondary | ICD-10-CM | POA: Diagnosis not present

## 2020-08-20 DIAGNOSIS — K449 Diaphragmatic hernia without obstruction or gangrene: Secondary | ICD-10-CM | POA: Diagnosis not present

## 2020-08-20 DIAGNOSIS — R627 Adult failure to thrive: Secondary | ICD-10-CM | POA: Diagnosis present

## 2020-08-20 DIAGNOSIS — Z20822 Contact with and (suspected) exposure to covid-19: Secondary | ICD-10-CM | POA: Diagnosis not present

## 2020-08-20 DIAGNOSIS — Z7901 Long term (current) use of anticoagulants: Secondary | ICD-10-CM | POA: Diagnosis not present

## 2020-08-20 DIAGNOSIS — K219 Gastro-esophageal reflux disease without esophagitis: Secondary | ICD-10-CM | POA: Diagnosis present

## 2020-08-20 DIAGNOSIS — K3189 Other diseases of stomach and duodenum: Secondary | ICD-10-CM | POA: Diagnosis not present

## 2020-08-20 DIAGNOSIS — K635 Polyp of colon: Secondary | ICD-10-CM | POA: Diagnosis not present

## 2020-08-20 LAB — TYPE AND SCREEN
ABO/RH(D): B NEG
Antibody Screen: POSITIVE

## 2020-08-20 LAB — COMPREHENSIVE METABOLIC PANEL
ALT: 11 U/L (ref 0–44)
AST: 9 U/L — ABNORMAL LOW (ref 15–41)
Albumin: 2.5 g/dL — ABNORMAL LOW (ref 3.5–5.0)
Alkaline Phosphatase: 62 U/L (ref 38–126)
Anion gap: 8 (ref 5–15)
BUN: 6 mg/dL — ABNORMAL LOW (ref 8–23)
CO2: 25 mmol/L (ref 22–32)
Calcium: 8 mg/dL — ABNORMAL LOW (ref 8.9–10.3)
Chloride: 107 mmol/L (ref 98–111)
Creatinine, Ser: 0.67 mg/dL (ref 0.44–1.00)
GFR, Estimated: >60 mL/min (ref >60)
Glucose, Bld: 202 mg/dL — ABNORMAL HIGH (ref 70–99)
Potassium: 4 mmol/L (ref 3.5–5.1)
Sodium: 140 mmol/L (ref 135–145)
Total Bilirubin: 0.6 mg/dL (ref 0.3–1.2)
Total Protein: 5.7 g/dL — ABNORMAL LOW (ref 6.5–8.1)

## 2020-08-20 LAB — CBC
HCT: 31.7 % — ABNORMAL LOW (ref 36.0–46.0)
Hemoglobin: 9.3 g/dL — ABNORMAL LOW (ref 12.0–15.0)
MCH: 24.2 pg — ABNORMAL LOW (ref 26.0–34.0)
MCHC: 29.3 g/dL — ABNORMAL LOW (ref 30.0–36.0)
MCV: 82.3 fL (ref 80.0–100.0)
Platelets: 334 10*3/uL (ref 150–400)
RBC: 3.85 MIL/uL — ABNORMAL LOW (ref 3.87–5.11)
RDW: 18.6 % — ABNORMAL HIGH (ref 11.5–15.5)
WBC: 9 10*3/uL (ref 4.0–10.5)
nRBC: 0 % (ref 0.0–0.2)

## 2020-08-20 LAB — RESP PANEL BY RT-PCR (FLU A&B, COVID) ARPGX2
Influenza A by PCR: NEGATIVE
Influenza B by PCR: NEGATIVE
SARS Coronavirus 2 by RT PCR: NEGATIVE

## 2020-08-20 LAB — ABO/RH: ABO/RH(D): B NEG

## 2020-08-20 LAB — HEPARIN LEVEL (UNFRACTIONATED)
Heparin Unfractionated: 0.15 IU/mL — ABNORMAL LOW (ref 0.30–0.70)
Heparin Unfractionated: 0.33 IU/mL (ref 0.30–0.70)

## 2020-08-20 LAB — HEMOGLOBIN AND HEMATOCRIT, BLOOD
HCT: 31 % — ABNORMAL LOW (ref 36.0–46.0)
Hemoglobin: 9.2 g/dL — ABNORMAL LOW (ref 12.0–15.0)

## 2020-08-20 LAB — APTT: aPTT: 44 seconds — ABNORMAL HIGH (ref 24–36)

## 2020-08-20 LAB — GLUCOSE, CAPILLARY
Glucose-Capillary: 193 mg/dL — ABNORMAL HIGH (ref 70–99)
Glucose-Capillary: 166 mg/dL — ABNORMAL HIGH (ref 70–99)
Glucose-Capillary: 121 mg/dL — ABNORMAL HIGH (ref 70–99)
Glucose-Capillary: 123 mg/dL — ABNORMAL HIGH (ref 70–99)
Glucose-Capillary: 147 mg/dL — ABNORMAL HIGH (ref 70–99)

## 2020-08-20 LAB — PHOSPHORUS: Phosphorus: 3.2 mg/dL (ref 2.5–4.6)

## 2020-08-20 LAB — PROTIME-INR
INR: 1 (ref 0.8–1.2)
Prothrombin Time: 13.6 seconds (ref 11.4–15.2)

## 2020-08-20 LAB — MAGNESIUM: Magnesium: 1.5 mg/dL — ABNORMAL LOW (ref 1.7–2.4)

## 2020-08-20 MED ORDER — LINACLOTIDE 145 MCG PO CAPS
290.0000 ug | ORAL_CAPSULE | Freq: Once | ORAL | Status: AC
  Administered 2020-08-21: 290 ug via ORAL
  Filled 2020-08-20: qty 2

## 2020-08-20 MED ORDER — HEPARIN (PORCINE) 25000 UT/250ML-% IV SOLN
1300.0000 [IU]/h | INTRAVENOUS | Status: AC
  Administered 2020-08-20 (×2): 1150 [IU]/h via INTRAVENOUS
  Filled 2020-08-20 (×3): qty 250

## 2020-08-20 MED ORDER — ATORVASTATIN CALCIUM 10 MG PO TABS
20.0000 mg | ORAL_TABLET | Freq: Every day | ORAL | Status: DC
  Administered 2020-08-20 – 2020-08-21 (×2): 20 mg via ORAL
  Filled 2020-08-20 (×2): qty 2

## 2020-08-20 MED ORDER — BUDESONIDE 0.25 MG/2ML IN SUSP: 0.2500 mg | Freq: Two times a day (BID) | RESPIRATORY_TRACT | Status: DC | PRN

## 2020-08-20 MED ORDER — PEG 3350-KCL-NA BICARB-NACL 420 G PO SOLR: 4000.0000 mL | Freq: Once | ORAL | Status: DC

## 2020-08-20 MED ORDER — OXYCODONE HCL 5 MG PO TABS: 5.0000 mg | ORAL_TABLET | Freq: Four times a day (QID) | ORAL | Status: DC | PRN

## 2020-08-20 MED ORDER — EZETIMIBE 10 MG PO TABS
10.0000 mg | ORAL_TABLET | Freq: Every day | ORAL | Status: DC
  Administered 2020-08-20 – 2020-08-21 (×2): 10 mg via ORAL
  Filled 2020-08-20 (×3): qty 1

## 2020-08-20 MED ORDER — LINACLOTIDE 145 MCG PO CAPS
290.0000 ug | ORAL_CAPSULE | Freq: Once | ORAL | Status: AC
  Administered 2020-08-20: 290 ug via ORAL
  Filled 2020-08-20: qty 2

## 2020-08-20 MED ORDER — MAGNESIUM SULFATE 2 GM/50ML IV SOLN
2.0000 g | Freq: Once | INTRAVENOUS | Status: AC
  Administered 2020-08-20: 2 g via INTRAVENOUS
  Filled 2020-08-20: qty 50

## 2020-08-20 NOTE — Care Management Obs Status (Signed)
[place] NOTIFICATION   Patient Details  Name: RAILEY GLAD MRN: [id number] Date of Birth: [birth year]   Medicare Observation Status Notification Given:  Yes    Sherie Don, LCSW 08/20/2020, 2:11 PM

## 2020-08-20 NOTE — Progress Notes (Signed)
ANTICOAGULATION CONSULT NOTE - Initial Consult  Pharmacy Consult for Heparin Indication: pulmonary embolus  Allergies  Allergen Reactions   Penicillins Hives    Has patient had a PCN reaction causing immediate rash, facial/tongue/throat swelling, SOB or lightheadedness with hypotension: Yes Has patient had a PCN reaction causing severe rash involving mucus membranes or skin necrosis: No Has patient had a PCN reaction that required hospitalization: No Has patient had a PCN reaction occurring within the last 10 years: No If all of the above answers are "NO", then may proceed with Cephalosporin use. Patient has tolerated ceftriaxone in the recent past.   Sulfamethoxazole-Trimethoprim Nausea And Vomiting and Other (See Comments)    Stomach problems   Meperidine Other (See Comments)    Cramping   Meperidine Hcl Other (See Comments)    Cramping    Penicillin G Sodium Hives   Sulfa Antibiotics Nausea And Vomiting and Other (See Comments)    Stomach problems/GI upset   Sulfonamide Derivatives Nausea And Vomiting and Other (See Comments)    "Stomach problems"    Patient Measurements: Weight: 83 kg (182 lb 15.7 oz) Heparin Dosing Weight: 76 kg  Vital Signs: Temp: 98.3 F (36.8 C) (06/11 2207) Temp Source: Oral (06/11 2207) BP: 124/65 (06/11 2207) Pulse Rate: 104 (06/11 2207)  Labs: Recent Labs    08/19/20 2248  HGB 9.0*  HCT 30.5*  PLT 324  LABPROT 13.8  INR 1.1  CREATININE 0.71    Estimated Creatinine Clearance: 61.9 mL/min (by C-G formula based on SCr of 0.71 mg/dL).   Medical History: Past Medical History:  Diagnosis Date   Acute pulmonary embolism ([place])    b/l; dx'ed May '22   Asthma    Complication of anesthesia    Anesthesia told her years ago she got too cold during surgery   Diabetes mellitus without complication ([place])    Emphysema    Fibromyalgia    Osteoporosis    Rheumatoid arthritis(714.0)    Sleep apnea    no cpap    Medications:  PTA  Apixaban (pt reports last dose taken 6/9 AM) IV heparin started at [facility] on 6/10  Assessment: 80 yr female transferred from [facility] (arrived at [place] on 6/9) PMH significant for bilateral PE (dx 07/2020) and on Apixaban, COPD, DM, RA, chronic anemia Admitted with lower GI bleed  Goal of Therapy:  Heparin level 0.3-0.7 units/ml aPTT 66-102 seconds Monitor platelets by anticoagulation protocol: Yes   Plan:  No heparin bolus dosing per MD Continue heparin gtt @ current rate of 950 units/hr RN received report that rate was changed approx 20:00 on 6/11 at [facility] Will check aPTT and HL 8 hr after reported rate change  Daily heparin level & CBC while on IV heparin Use aPTT to monitor heparin until effects   Eriko Economos, Toribio Harbour, PharmD 08/20/2020,12:12 AM

## 2020-08-20 NOTE — H&P (View-Only) (Signed)
Referring Provider: [facility] Primary Care Physician:  [name], MD Primary Gastroenterologist:  Dr. Cristina Gong  Reason for Consultation: Hematochezia  HPI: [name] is a 80 y.o. female admitted through the emergency room yesterday evening in transfer from [facility], because of recent hematochezia.  The patient was diagnosed with acute bilateral pulmonary emboli last month and was started on Eliquis with good compliance.  She had hematochezia on June 8 and June 9, and presented on June 9 to the [facility] emergency room because of that bleeding.    No rabdominal pain or syncope.  The patient has known sigmoid diverticulosis based on her most recent colonoscopy, in November 2012, at which time she had a solitary diminutive adenoma.  She had had 3 previous colonoscopies all negative for polyps (2000, 2005, and 2008, performed because of a family history of colon polyps in her mother and a remote family history of colon cancer in an aunt).  The patient has had no further rectal bleeding for the past 3 days.  The patient reports that she has had pleuritic chest pain (nonexertional) on an occasional basis since her pulmonary emboli were diagnosed last month.  The patient was seen by me in the office upon referral from her primary physician almost 2 years ago, in August 2020, for evaluation of microcytic anemia.  And endoscopy and colonoscopy were scheduled at that time, but were canceled by the patient because "she did not feel up to it."  We then tried to reschedule her exams several months later but she still declined to have the procedures at that point, again "not feeling up to it.".  Unfortunately, the patient has severe rheumatoid arthritis and is wheelchair-bound and has very limited mobility.  Here in the hospital, the patient is being maintained on a heparin infusion.     Past Medical History:  Diagnosis Date   Acute pulmonary embolism ([place])    b/l; dx'ed May '22    Asthma    Complication of anesthesia    Anesthesia told her years ago she got too cold during surgery   Diabetes mellitus without complication ([place])    Emphysema    Fibromyalgia    Osteoporosis    Rheumatoid arthritis(714.0)    Sleep apnea    no cpap    Past Surgical History:  Procedure Laterality Date   ABDOMINAL HYSTERECTOMY     ANKLE FRACTURE SURGERY  2007   BREAST SURGERY     mammoplasty and then removed   FOOT SURGERY     numerous   I & D EXTREMITY Right 10/26/2014   Procedure: IRRIGATION AND DEBRIDEMENT RIGHT KNEE ;  Surgeon: Gaynelle Arabian, MD;  Location: [facility];  Service: Orthopedics;  Laterality: Right;   IRRIGATION AND DEBRIDEMENT KNEE Right 03/09/2017   Procedure: IRRIGATION AND DEBRIDEMENT RIGHT KNEE PRE-PATELLAR BURSA;  Surgeon: Susa Day, MD;  Location: [place];  Service: Orthopedics;  Laterality: Right;   KNEE BURSECTOMY Right 09/16/2014   Procedure: EXCISON OF PRE PATELLA BURSA RIGHT KNEE ;  Surgeon: Gaynelle Arabian, MD;  Location: [facility];  Service: Orthopedics;  Laterality: Right;   PARTIAL HYSTERECTOMY  1970   TOTAL KNEE ARTHROPLASTY  2005 / 2006   x2    Prior to Admission medications   Medication Sig Start Date End Date Taking? Authorizing Provider  acetaminophen (TYLENOL) 500 MG tablet Take 500-1,000 mg by mouth every 6 (six) hours as needed (for headaches).   Yes [provider]  albuterol (VENTOLIN HFA) 108 (90 Base)  MCG/ACT inhaler Inhale 2 puffs into the lungs every 6 (six) hours as needed for shortness of breath or wheezing. 07/05/19  Yes [provider]  arformoterol (BROVANA) 15 MCG/2ML NEBU Take 15 mcg by nebulization daily.   Yes [provider]  atorvastatin (LIPITOR) 20 MG tablet Take 20 mg by mouth at bedtime.   Yes [provider]  budesonide (PULMICORT) 0.25 MG/2ML nebulizer solution Take 0.25 mg by nebulization 2 (two) times daily as needed (sob/wheezing).   Yes [provider]  Cholecalciferol  (VITAMIN D3) 50 MCG (2000 UT) TABS Take 2,000 Units by mouth at bedtime.   Yes [provider]  Cyanocobalamin (B-12) 2500 MCG TABS Take 2,500 mcg by mouth at bedtime.    Yes [provider]  DULoxetine (CYMBALTA) 60 MG capsule Take 60 mg by mouth at bedtime.    Yes [provider]  ezetimibe (ZETIA) 10 MG tablet Take 10 mg by mouth at bedtime.  11/04/16  Yes [provider]  insulin NPH-regular Human (70-30) 100 UNIT/ML injection Inject 14 Units into the skin 2 (two) times daily with a meal.   Yes [provider]  loperamide (IMODIUM A-D) 2 MG tablet Take 2 mg by mouth 4 (four) times daily as needed for diarrhea or loose stools.   Yes [provider]  metFORMIN (GLUCOPHAGE) 500 MG tablet Take 1,000 mg by mouth at bedtime.   Yes [provider]  oxyCODONE (OXY IR/ROXICODONE) 5 MG immediate release tablet Take 1 tablet (5 mg total) by mouth every 6 (six) hours as needed for severe pain or moderate pain. 07/26/20  Yes Donne Hazel, MD  pantoprazole (PROTONIX) 40 MG tablet Take one tablet once every morning Patient taking differently: Take 40 mg by mouth at bedtime. 05/26/20  Yes [name], MD  predniSONE (DELTASONE) 20 MG tablet Take 20 mg by mouth daily as needed (pain).   Yes [provider]  predniSONE (DELTASONE) 5 MG tablet Take 5 mg by mouth at bedtime.   Yes [provider]  apixaban (ELIQUIS) 5 MG TABS tablet Take 2 tablets (10 mg total) by mouth 2 (two) times daily. Patient not taking: No sig reported 07/26/20   Donne Hazel, MD  apixaban (ELIQUIS) 5 MG TABS tablet Take 1 tablet (5 mg total) by mouth 2 (two) times daily. Patient not taking: No sig reported 08/02/20 09/01/20  Donne Hazel, MD  arformoterol (BROVANA) 15 MCG/2ML NEBU Take 2 mLs (15 mcg total) by nebulization 2 (two) times daily. 05/26/20 06/25/20  [name], MD  B-D UF III MINI PEN NEEDLES 31G X 5 MM MISC SMARTSIG:1 Each SUB-Q 4 Times Daily  10/22/19   [provider]  budesonide (PULMICORT) 0.25 MG/2ML nebulizer solution Take 2 mLs (0.25 mg total) by nebulization 2 (two) times daily. 05/26/20 06/25/20  [name], MD  ipratropium-albuterol (DUONEB) 0.5-2.5 (3) MG/3ML SOLN Take 3 mLs by nebulization every 6 (six) hours as needed. Patient not taking: Reported on 07/22/2020 07/19/20   [provider]  Nebulizers (COMPRESSOR/NEBULIZER) MISC Nebulizer machine/mask/connector for asthma/emphysema 05/26/20   [name], MD  [facility] VERIO test strip  10/13/18   [provider]  tolterodine (DETROL LA) 2 MG 24 hr capsule Take 2 mg by mouth daily. 08/15/20   [provider]  MYRBETRIQ 50 MG TB24 tablet Take 50 mg by mouth at bedtime. Patient not taking: Reported on 08/19/2020 03/29/20 08/19/20  [provider]    Current Facility-Administered Medications  Medication Dose Route  Frequency Provider Last Rate Last Admin   acetaminophen (TYLENOL) tablet 650 mg  650 mg Oral Q6H PRN Howerter, Justin B, DO       Or   acetaminophen (TYLENOL) suppository 650 mg  650 mg Rectal Q6H PRN Howerter, Justin B, DO       albuterol (PROVENTIL) (2.5 MG/3ML) 0.083% nebulizer solution 2.5 mg  2.5 mg Inhalation Q6H PRN Howerter, Justin B, DO   2.5 mg at 08/20/20 0619   arformoterol (BROVANA) nebulizer solution 15 mcg  15 mcg Nebulization Daily Howerter, Justin B, DO   15 mcg at 08/20/20 0904   heparin ADULT infusion 100 units/mL (25000 units/219mL)  1,150 Units/hr Intravenous Continuous Poindexter, Leann T, RPH 11.5 mL/hr at 08/20/20 1444 1,150 Units/hr at 08/20/20 1444   insulin aspart (novoLOG) injection 0-6 Units  0-6 Units Subcutaneous Q6H Howerter, Justin B, DO   1 Units at 08/20/20 0604   linaclotide (LINZESS) capsule 290 mcg  290 mcg Oral Once Ronald Lobo, MD       Derrill Memo ON 08/21/2020] linaclotide (LINZESS) capsule 290 mcg  290 mcg Oral Once Jayleon Mcfarlane, Herbie Baltimore, MD       pantoprazole (PROTONIX) injection 40 mg  40 mg  Intravenous Q24H Howerter, Justin B, DO       predniSONE (DELTASONE) tablet 5 mg  5 mg Oral QHS Howerter, Justin B, DO   5 mg at 08/20/20 0021    Allergies as of 08/17/2020 - Review Complete 07/23/2020  Allergen Reaction Noted   Penicillins Hives    Sulfamethoxazole-trimethoprim Nausea And Vomiting and Other (See Comments) 08/21/2017   Meperidine Other (See Comments) 05/14/2018   Meperidine hcl Other (See Comments) 06/27/2020   Penicillin g sodium Hives 06/27/2020   Sulfa antibiotics Nausea And Vomiting and Other (See Comments) 05/14/2018   Sulfonamide derivatives Nausea And Vomiting and Other (See Comments)     Family History  Problem Relation Age of Onset   Hypertension Other    Diabetes Other    Stroke Other    Diabetes Mother    Diabetes Father    Diabetes Sister    Diabetes Maternal Aunt    Cancer Paternal Uncle    Cancer Paternal Grandmother    Breast cancer Paternal Grandmother     Social History   Socioeconomic History   Marital status: Divorced    Spouse name: Not on file   Number of children: Not on file   Years of education: Not on file   Highest education level: Not on file  Occupational History   Not on file  Tobacco Use   Smoking status: Former    Pack years: 0.00    Types: Cigarettes    Quit date: 03/11/1994    Years since quitting: 26.4   Smokeless tobacco: Never  Vaping Use   Vaping Use: Never used  Substance and Sexual Activity   Alcohol use: No   Drug use: No   Sexual activity: Not on file  Other Topics Concern   Not on file  Social History Narrative   Not on file   Social Determinants of Health   Financial Resource Strain: Not on file  Food Insecurity: No Food Insecurity   Worried About Charity fundraiser in the Last Year: Never true   Ran Out of Food in the Last Year: Never true  Transportation Needs: Unmet Transportation Needs   Lack of Transportation (Medical): Yes   Lack of Transportation (Non-Medical): Yes  Physical Activity:  Not on file  Stress: Not on  file  Social Connections: Not on file  Intimate Partner Violence: Not on file    Review of Systems: See HPI  Physical Exam: Vital signs in last 24 hours: Temp:  [97.5 F (36.4 C)-98.5 F (36.9 C)] 97.5 F (36.4 C) (06/12 1451) Pulse Rate:  [85-104] 87 (06/12 1451) Resp:  [16-20] 16 (06/12 1451) BP: (110-135)/(53-65) 110/59 (06/12 1451) SpO2:  [96 %-100 %] 97 % (06/12 1451) Weight:  [83 kg] 83 kg (06/12 0000) Last BM Date: 08/18/20 General:   Alert,  Well-developed, well-nourished, pleasant and cooperative in NAD.  Very cognitively intact. Head:  Normocephalic and atraumatic. Eyes:  Sclera clear, no icterus.    Lungs:  Clear throughout to auscultation.   No wheezes, crackles, or rhonchi. No evident respiratory distress. Heart:   Regular rate and rhythm; no murmurs, clicks, rubs,  or gallops. Abdomen: Without mass-effect or tenderness Msk:   Significant rheumatoid deformities of both hands Neurologic:  Alert and coherent;  grossly normal neurologically. Psych:   Alert and cooperative. Normal mood and affect.  Intake/Output from previous day: No intake/output data recorded. Intake/Output this shift: Total I/O In: 76.1 [I.V.:76.1] Out: -   Lab Results: Recent Labs    08/19/20 2248 08/20/20 0503 08/20/20 0914  WBC 9.2 9.0  --   HGB 9.0* 9.3* 9.2*  HCT 30.5* 31.7* 31.0*  PLT 324 334  --    BMET Recent Labs    08/19/20 2248 08/20/20 0503  NA 138 140  K 3.6 4.0  CL 106 107  CO2 24 25  GLUCOSE 154* 202*  BUN 7* 6*  CREATININE 0.71 0.67  CALCIUM 7.8* 8.0*   LFT Recent Labs    08/20/20 0503  PROT 5.7*  ALBUMIN 2.5*  AST 9*  ALT 11  ALKPHOS 62  BILITOT 0.6   PT/INR Recent Labs    08/19/20 2248 08/20/20 0503  LABPROT 13.8 13.6  INR 1.1 1.0    Studies/Results: No results found.  Impression: 1.  Transient hematochezia while on anticoagulation 2.  Acute on chronic anemia, presumably with a posthemorrhagic element 3.   Prior history of microcytic anemia, going back at least 2 years, but with work-up not performed due to patient debility 4.  Sigmoid diverticulosis 5.  Family history of colon polyps, remote family history of colon cancer, personal history of solitary diminutive adenoma last colonoscoped 11 years ago 2.  Rheumatoid arthritis with severe mobility limitations  Plan: The patient is agreeable to endoscopy and colonoscopy for GI evaluation.  However, there are several impediments:  1.  First, there is limited scheduling availability.  The first available time for physician and endoscopy center and anesthesia is Tuesday afternoon, and currently, that will only allow time for colonoscopy unless the anesthesia department can allow [place] more time.  The patient is aware of this.  We will at least try to get a colonoscopy performed, but hopefully, an upper endoscopy can be added as well.  2.  The patient will need her heparin infusion stopped approximately 4 hours preprocedure.  3.  Prepping this patient will probably be quite difficult.  Even before she developed severe mobility issues, she was very concerned about drinking the "gallon jug."  Now, there will be difficulty with her transferring from the bed to the bedside commode without staff assistance.  Accordingly, I am giving her doses of Linzess today and tomorrow, before the actual prep is started.  Hopefully, this will lead to several bowel movements and get her partially cleaned out so  perhaps she will not have to consume the entire gallon jug of solution.     LOS: 1 day   Youlanda Mighty Kili Gracy  08/20/2020, 3:22 PM   Pager [phone] If no answer or after 5 PM call [phone]

## 2020-08-20 NOTE — Progress Notes (Signed)
[place] for Heparin Indication: pulmonary embolus  Allergies  Allergen Reactions   Penicillins Hives    Has patient had a PCN reaction causing immediate rash, facial/tongue/throat swelling, SOB or lightheadedness with hypotension: Yes Has patient had a PCN reaction causing severe rash involving mucus membranes or skin necrosis: No Has patient had a PCN reaction that required hospitalization: No Has patient had a PCN reaction occurring within the last 10 years: No If all of the above answers are "NO", then may proceed with Cephalosporin use. Patient has tolerated ceftriaxone in the recent past.   Sulfamethoxazole-Trimethoprim Nausea And Vomiting and Other (See Comments)    Stomach problems   Meperidine Other (See Comments)    Cramping   Meperidine Hcl Other (See Comments)    Cramping    Penicillin G Sodium Hives   Sulfa Antibiotics Nausea And Vomiting and Other (See Comments)    Stomach problems/GI upset   Sulfonamide Derivatives Nausea And Vomiting and Other (See Comments)    "Stomach problems"    Patient Measurements: Weight: 83 kg (182 lb 15.7 oz) Heparin Dosing Weight: 76 kg  Vital Signs: Temp: 97.7 F (36.5 C) (06/12 0549) Temp Source: Oral (06/12 0549) BP: 125/53 (06/12 0549) Pulse Rate: 92 (06/12 0549)  Labs: Recent Labs    08/19/20 2248 08/20/20 0503  HGB 9.0* 9.3*  HCT 30.5* 31.7*  PLT 324 334  APTT  --  44*  LABPROT 13.8 13.6  INR 1.1 1.0  HEPARINUNFRC  --  0.15*  CREATININE 0.71 0.67     Estimated Creatinine Clearance: 61.9 mL/min (by C-G formula based on SCr of 0.67 mg/dL).   Medical History: Past Medical History:  Diagnosis Date   Acute pulmonary embolism ([place])    b/l; dx'ed May '22   Asthma    Complication of anesthesia    Anesthesia told her years ago she got too cold during surgery   Diabetes mellitus without complication ([facility])    Emphysema    Fibromyalgia    Osteoporosis    Rheumatoid  arthritis(714.0)    Sleep apnea    no cpap    Medications:  PTA Apixaban (pt reports last dose taken 6/9 AM) IV heparin [facility] on 6/10  Assessment: 80 yr female transferred from [facility] (arrived at [place] on 6/9) PMH significant for bilateral PE (dx 07/2020) and on Apixaban, COPD, DM, RA, chronic anemia Admitted with lower GI bleed  08/20/20 Heparin level = 0.15 and aPTT = 44 sec with heparin gtt @ 950 units/hr Hgb = 9.3  PLTC 334K Patient with GI bleed  Goal of Therapy:  Heparin level 0.3-0.7 units/ml aPTT 66-102 seconds Monitor platelets by anticoagulation protocol: Yes   Plan:  No heparin bolus dosing per MD Increase heparin gtt to 1150 units/hr As HL and aPTT both subtherapeutic, will monitor heparin with just heparin levels Will check HL 8 hr after rate increase Daily heparin level & CBC while on IV heparin   Everette Rank, PharmD 08/20/2020,6:40 AM

## 2020-08-20 NOTE — Consult Note (Signed)
Referring Provider: [facility] Primary Care Physician:  [name], MD Primary Gastroenterologist:  Dr. Cristina Gong  Reason for Consultation: Hematochezia  HPI: [name] is a 80 y.o. female admitted through the emergency room yesterday evening in transfer from [facility], because of recent hematochezia.  The patient was diagnosed with acute bilateral pulmonary emboli last month and was started on Eliquis with good compliance.  She had hematochezia on June 8 and June 9, and presented on June 9 to the [facility] emergency room because of that bleeding.    No rabdominal pain or syncope.  The patient has known sigmoid diverticulosis based on her most recent colonoscopy, in November 2012, at which time she had a solitary diminutive adenoma.  She had had 3 previous colonoscopies all negative for polyps (2000, 2005, and 2008, performed because of a family history of colon polyps in her mother and a remote family history of colon cancer in an aunt).  The patient has had no further rectal bleeding for the past 3 days.  The patient reports that she has had pleuritic chest pain (nonexertional) on an occasional basis since her pulmonary emboli were diagnosed last month.  The patient was seen by me in the office upon referral from her primary physician almost 2 years ago, in August 2020, for evaluation of microcytic anemia.  And endoscopy and colonoscopy were scheduled at that time, but were canceled by the patient because "she did not feel up to it."  We then tried to reschedule her exams several months later but she still declined to have the procedures at that point, again "not feeling up to it.".  Unfortunately, the patient has severe rheumatoid arthritis and is wheelchair-bound and has very limited mobility.  Here in the hospital, the patient is being maintained on a heparin infusion.     Past Medical History:  Diagnosis Date   Acute pulmonary embolism ([place])    b/l; dx'ed May '22    Asthma    Complication of anesthesia    Anesthesia told her years ago she got too cold during surgery   Diabetes mellitus without complication ([place])    Emphysema    Fibromyalgia    Osteoporosis    Rheumatoid arthritis(714.0)    Sleep apnea    no cpap    Past Surgical History:  Procedure Laterality Date   ABDOMINAL HYSTERECTOMY     ANKLE FRACTURE SURGERY  2007   BREAST SURGERY     mammoplasty and then removed   FOOT SURGERY     numerous   I & D EXTREMITY Right 10/26/2014   Procedure: IRRIGATION AND DEBRIDEMENT RIGHT KNEE ;  Surgeon: Gaynelle Arabian, MD;  Location: [facility];  Service: Orthopedics;  Laterality: Right;   IRRIGATION AND DEBRIDEMENT KNEE Right 03/09/2017   Procedure: IRRIGATION AND DEBRIDEMENT RIGHT KNEE PRE-PATELLAR BURSA;  Surgeon: Susa Day, MD;  Location: [facility];  Service: Orthopedics;  Laterality: Right;   KNEE BURSECTOMY Right 09/16/2014   Procedure: EXCISON OF PRE PATELLA BURSA RIGHT KNEE ;  Surgeon: Gaynelle Arabian, MD;  Location: [facility];  Service: Orthopedics;  Laterality: Right;   PARTIAL HYSTERECTOMY  1970   TOTAL KNEE ARTHROPLASTY  2005 / 2006   x2    Prior to Admission medications   Medication Sig Start Date End Date Taking? Authorizing Provider  acetaminophen (TYLENOL) 500 MG tablet Take 500-1,000 mg by mouth every 6 (six) hours as needed (for headaches).   Yes [provider]  albuterol (VENTOLIN HFA) 108 (90 Base)  MCG/ACT inhaler Inhale 2 puffs into the lungs every 6 (six) hours as needed for shortness of breath or wheezing. 07/05/19  Yes [provider]  arformoterol (BROVANA) 15 MCG/2ML NEBU Take 15 mcg by nebulization daily.   Yes [provider]  atorvastatin (LIPITOR) 20 MG tablet Take 20 mg by mouth at bedtime.   Yes [provider]  budesonide (PULMICORT) 0.25 MG/2ML nebulizer solution Take 0.25 mg by nebulization 2 (two) times daily as needed (sob/wheezing).   Yes [provider]  Cholecalciferol  (VITAMIN D3) 50 MCG (2000 UT) TABS Take 2,000 Units by mouth at bedtime.   Yes [provider]  Cyanocobalamin (B-12) 2500 MCG TABS Take 2,500 mcg by mouth at bedtime.    Yes [provider]  DULoxetine (CYMBALTA) 60 MG capsule Take 60 mg by mouth at bedtime.    Yes [provider]  ezetimibe (ZETIA) 10 MG tablet Take 10 mg by mouth at bedtime.  11/04/16  Yes [provider]  insulin NPH-regular Human (70-30) 100 UNIT/ML injection Inject 14 Units into the skin 2 (two) times daily with a meal.   Yes [provider]  loperamide (IMODIUM A-D) 2 MG tablet Take 2 mg by mouth 4 (four) times daily as needed for diarrhea or loose stools.   Yes [provider]  metFORMIN (GLUCOPHAGE) 500 MG tablet Take 1,000 mg by mouth at bedtime.   Yes [provider]  oxyCODONE (OXY IR/ROXICODONE) 5 MG immediate release tablet Take 1 tablet (5 mg total) by mouth every 6 (six) hours as needed for severe pain or moderate pain. 07/26/20  Yes Donne Hazel, MD  pantoprazole (PROTONIX) 40 MG tablet Take one tablet once every morning Patient taking differently: Take 40 mg by mouth at bedtime. 05/26/20  Yes [name], MD  predniSONE (DELTASONE) 20 MG tablet Take 20 mg by mouth daily as needed (pain).   Yes [provider]  predniSONE (DELTASONE) 5 MG tablet Take 5 mg by mouth at bedtime.   Yes [provider]  apixaban (ELIQUIS) 5 MG TABS tablet Take 2 tablets (10 mg total) by mouth 2 (two) times daily. Patient not taking: No sig reported 07/26/20   Donne Hazel, MD  apixaban (ELIQUIS) 5 MG TABS tablet Take 1 tablet (5 mg total) by mouth 2 (two) times daily. Patient not taking: No sig reported 08/02/20 09/01/20  Donne Hazel, MD  arformoterol (BROVANA) 15 MCG/2ML NEBU Take 2 mLs (15 mcg total) by nebulization 2 (two) times daily. 05/26/20 06/25/20  [name], MD  B-D UF III MINI PEN NEEDLES 31G X 5 MM MISC SMARTSIG:1 Each SUB-Q 4 Times Daily  10/22/19   [provider]  budesonide (PULMICORT) 0.25 MG/2ML nebulizer solution Take 2 mLs (0.25 mg total) by nebulization 2 (two) times daily. 05/26/20 06/25/20  [name], MD  ipratropium-albuterol (DUONEB) 0.5-2.5 (3) MG/3ML SOLN Take 3 mLs by nebulization every 6 (six) hours as needed. Patient not taking: Reported on 07/22/2020 07/19/20   [provider]  Nebulizers (COMPRESSOR/NEBULIZER) MISC Nebulizer machine/mask/connector for asthma/emphysema 05/26/20   [name], MD  [facility] VERIO test strip  10/13/18   [provider]  tolterodine (DETROL LA) 2 MG 24 hr capsule Take 2 mg by mouth daily. 08/15/20   [provider]  MYRBETRIQ 50 MG TB24 tablet Take 50 mg by mouth at bedtime. Patient not taking: Reported on 08/19/2020 03/29/20 08/19/20  [provider]    Current Facility-Administered Medications  Medication Dose Route  Frequency Provider Last Rate Last Admin   acetaminophen (TYLENOL) tablet 650 mg  650 mg Oral Q6H PRN Howerter, Justin B, DO       Or   acetaminophen (TYLENOL) suppository 650 mg  650 mg Rectal Q6H PRN Howerter, Justin B, DO       albuterol (PROVENTIL) (2.5 MG/3ML) 0.083% nebulizer solution 2.5 mg  2.5 mg Inhalation Q6H PRN Howerter, Justin B, DO   2.5 mg at 08/20/20 0619   arformoterol (BROVANA) nebulizer solution 15 mcg  15 mcg Nebulization Daily Howerter, Justin B, DO   15 mcg at 08/20/20 0904   heparin ADULT infusion 100 units/mL (25000 units/262mL)  1,150 Units/hr Intravenous Continuous Poindexter, Leann T, RPH 11.5 mL/hr at 08/20/20 1444 1,150 Units/hr at 08/20/20 1444   insulin aspart (novoLOG) injection 0-6 Units  0-6 Units Subcutaneous Q6H Howerter, Justin B, DO   1 Units at 08/20/20 0604   linaclotide (LINZESS) capsule 290 mcg  290 mcg Oral Once Ronald Lobo, MD       Derrill Memo ON 08/21/2020] linaclotide (LINZESS) capsule 290 mcg  290 mcg Oral Once Buffy Ehler, Herbie Baltimore, MD       pantoprazole (PROTONIX) injection 40 mg  40 mg  Intravenous Q24H Howerter, Justin B, DO       predniSONE (DELTASONE) tablet 5 mg  5 mg Oral QHS Howerter, Justin B, DO   5 mg at 08/20/20 0021    Allergies as of 08/17/2020 - Review Complete 07/23/2020  Allergen Reaction Noted   Penicillins Hives    Sulfamethoxazole-trimethoprim Nausea And Vomiting and Other (See Comments) 08/21/2017   Meperidine Other (See Comments) 05/14/2018   Meperidine hcl Other (See Comments) 06/27/2020   Penicillin g sodium Hives 06/27/2020   Sulfa antibiotics Nausea And Vomiting and Other (See Comments) 05/14/2018   Sulfonamide derivatives Nausea And Vomiting and Other (See Comments)     Family History  Problem Relation Age of Onset   Hypertension Other    Diabetes Other    Stroke Other    Diabetes Mother    Diabetes Father    Diabetes Sister    Diabetes Maternal Aunt    Cancer Paternal Uncle    Cancer Paternal Grandmother    Breast cancer Paternal Grandmother     Social History   Socioeconomic History   Marital status: Divorced    Spouse name: Not on file   Number of children: Not on file   Years of education: Not on file   Highest education level: Not on file  Occupational History   Not on file  Tobacco Use   Smoking status: Former    Pack years: 0.00    Types: Cigarettes    Quit date: 03/11/1994    Years since quitting: 26.4   Smokeless tobacco: Never  Vaping Use   Vaping Use: Never used  Substance and Sexual Activity   Alcohol use: No   Drug use: No   Sexual activity: Not on file  Other Topics Concern   Not on file  Social History Narrative   Not on file   Social Determinants of Health   Financial Resource Strain: Not on file  Food Insecurity: No Food Insecurity   Worried About Charity fundraiser in the Last Year: Never true   Ran Out of Food in the Last Year: Never true  Transportation Needs: Unmet Transportation Needs   Lack of Transportation (Medical): Yes   Lack of Transportation (Non-Medical): Yes  Physical Activity:  Not on file  Stress: Not on  file  Social Connections: Not on file  Intimate Partner Violence: Not on file    Review of Systems: See HPI  Physical Exam: Vital signs in last 24 hours: Temp:  [97.5 F (36.4 C)-98.5 F (36.9 C)] 97.5 F (36.4 C) (06/12 1451) Pulse Rate:  [85-104] 87 (06/12 1451) Resp:  [16-20] 16 (06/12 1451) BP: (110-135)/(53-65) 110/59 (06/12 1451) SpO2:  [96 %-100 %] 97 % (06/12 1451) Weight:  [83 kg] 83 kg (06/12 0000) Last BM Date: 08/18/20 General:   Alert,  Well-developed, well-nourished, pleasant and cooperative in NAD.  Very cognitively intact. Head:  Normocephalic and atraumatic. Eyes:  Sclera clear, no icterus.    Lungs:  Clear throughout to auscultation.   No wheezes, crackles, or rhonchi. No evident respiratory distress. Heart:   Regular rate and rhythm; no murmurs, clicks, rubs,  or gallops. Abdomen: Without mass-effect or tenderness Msk:   Significant rheumatoid deformities of both hands Neurologic:  Alert and coherent;  grossly normal neurologically. Psych:   Alert and cooperative. Normal mood and affect.  Intake/Output from previous day: No intake/output data recorded. Intake/Output this shift: Total I/O In: 76.1 [I.V.:76.1] Out: -   Lab Results: Recent Labs    08/19/20 2248 08/20/20 0503 08/20/20 0914  WBC 9.2 9.0  --   HGB 9.0* 9.3* 9.2*  HCT 30.5* 31.7* 31.0*  PLT 324 334  --    BMET Recent Labs    08/19/20 2248 08/20/20 0503  NA 138 140  K 3.6 4.0  CL 106 107  CO2 24 25  GLUCOSE 154* 202*  BUN 7* 6*  CREATININE 0.71 0.67  CALCIUM 7.8* 8.0*   LFT Recent Labs    08/20/20 0503  PROT 5.7*  ALBUMIN 2.5*  AST 9*  ALT 11  ALKPHOS 62  BILITOT 0.6   PT/INR Recent Labs    08/19/20 2248 08/20/20 0503  LABPROT 13.8 13.6  INR 1.1 1.0    Studies/Results: No results found.  Impression: 1.  Transient hematochezia while on anticoagulation 2.  Acute on chronic anemia, presumably with a posthemorrhagic element 3.   Prior history of microcytic anemia, going back at least 2 years, but with work-up not performed due to patient debility 4.  Sigmoid diverticulosis 5.  Family history of colon polyps, remote family history of colon cancer, personal history of solitary diminutive adenoma last colonoscoped 11 years ago 46.  Rheumatoid arthritis with severe mobility limitations  Plan: The patient is agreeable to endoscopy and colonoscopy for GI evaluation.  However, there are several impediments:  1.  First, there is limited scheduling availability.  The first available time for physician and endoscopy center and anesthesia is Tuesday afternoon, and currently, that will only allow time for colonoscopy unless the anesthesia department can allow [place] more time.  The patient is aware of this.  We will at least try to get a colonoscopy performed, but hopefully, an upper endoscopy can be added as well.  2.  The patient will need her heparin infusion stopped approximately 4 hours preprocedure.  3.  Prepping this patient will probably be quite difficult.  Even before she developed severe mobility issues, she was very concerned about drinking the "gallon jug."  Now, there will be difficulty with her transferring from the bed to the bedside commode without staff assistance.  Accordingly, I am giving her doses of Linzess today and tomorrow, before the actual prep is started.  Hopefully, this will lead to several bowel movements and get her partially cleaned out so  perhaps she will not have to consume the entire gallon jug of solution.     LOS: 1 day   Youlanda Mighty Joie Hipps  08/20/2020, 3:22 PM   Pager [phone] If no answer or after 5 PM call [phone]

## 2020-08-20 NOTE — Progress Notes (Signed)
[place] for Heparin Indication: pulmonary embolus  Allergies  Allergen Reactions   Penicillins Hives    Has patient had a PCN reaction causing immediate rash, facial/tongue/throat swelling, SOB or lightheadedness with hypotension: Yes Has patient had a PCN reaction causing severe rash involving mucus membranes or skin necrosis: No Has patient had a PCN reaction that required hospitalization: No Has patient had a PCN reaction occurring within the last 10 years: No If all of the above answers are "NO", then may proceed with Cephalosporin use. Patient has tolerated ceftriaxone in the recent past.   Sulfamethoxazole-Trimethoprim Nausea And Vomiting and Other (See Comments)    Stomach problems   Meperidine Other (See Comments)    Cramping   Meperidine Hcl Other (See Comments)    Cramping    Penicillin G Sodium Hives   Sulfa Antibiotics Nausea And Vomiting and Other (See Comments)    Stomach problems/GI upset   Sulfonamide Derivatives Nausea And Vomiting and Other (See Comments)    "Stomach problems"    Patient Measurements: Weight: 83 kg (182 lb 15.7 oz) Heparin Dosing Weight: 76 kg  Vital Signs: Temp: 97.5 F (36.4 C) (06/12 1451) Temp Source: Oral (06/12 1451) BP: 110/59 (06/12 1451) Pulse Rate: 87 (06/12 1451)  Labs: Recent Labs    08/19/20 2248 08/20/20 0503 08/20/20 0914 08/20/20 1510  HGB 9.0* 9.3* 9.2*  --   HCT 30.5* 31.7* 31.0*  --   PLT 324 334  --   --   APTT  --  44*  --   --   LABPROT 13.8 13.6  --   --   INR 1.1 1.0  --   --   HEPARINUNFRC  --  0.15*  --  0.33  CREATININE 0.71 0.67  --   --      Estimated Creatinine Clearance: 61.9 mL/min (by C-G formula based on SCr of 0.67 mg/dL).   Medical History: Past Medical History:  Diagnosis Date   Acute pulmonary embolism ([place])    b/l; dx'ed May '22   Asthma    Complication of anesthesia    Anesthesia told her years ago she got too cold during surgery    Diabetes mellitus without complication ([place])    Emphysema    Fibromyalgia    Osteoporosis    Rheumatoid arthritis(714.0)    Sleep apnea    no cpap    Medications:  PTA Apixaban (pt reports last dose taken 6/9 AM) IV heparin started at [facility] on 6/10  Assessment: 80 yr female transferred from [facility] (arrived at [place] on 6/9) PMH significant for bilateral PE (dx 07/2020) on Apixaban, COPD, DM, RA, chronic anemia Admitted with lower GI bleed Pharmacy consulted for IV heparin dosing on admission. No bolus per MD.   08/20/20 AM heparin level and aPTT correlate, so now monitoring/titrating heparin infusion based on heparin levels only  1510 Heparin level = 0.33 units/mL, now therapeutic Hgb = 9.2, Pltc WNL Patient with GI bleed-plan for GI procedures likely on Tuesday  Goal of Therapy:  Heparin level 0.3-0.5 units/ml (target lower end of therapeutic range per Dr. Erlinda Hong) Monitor platelets by anticoagulation protocol: Yes   Plan:  Continue heparin infusion at 1150 units/hr Check heparin level in 8 hours to confirm remains within therapeutic range Daily heparin level & CBC while on IV heparin   Lindell Spar, PharmD, BCPS 08/20/2020,4:32 PM

## 2020-08-20 NOTE — Progress Notes (Signed)
PROGRESS NOTE    [name]  GYK:[id number] DOB: 12-04-1940 DOA: 08/19/2020 PCP: [name], MD    No chief complaint on file.   Brief Narrative:  [name] is a 80 y.o. female with medical history significant for acute bilateral pulmonary embolism diagnosed in May 2022 subsequently anticoagulated on Eliquis, COPD, type 2 diabetes mellitus, rheumatoid arthritis on chronic low-dose prednisone therapy,presents for hematochezia and dark stool  Subjective:  Denies pain, no n/v, no active bleed, reports started to feel weak a few days ago, continue to feel weak Tolerating heparin drip Denies feeling sob, no documented hypoxia, " they just put oxygen on me in the emergency room"  Assessment & Plan:   Principal Problem:   Acute lower GI bleeding Active Problems:   GERD   Rheumatoid arthritis ([place])   Diabetes mellitus due to underlying condition, controlled, with complication, without long-term current use of insulin (HCC)   Other hyperlipidemia   Acute pulmonary embolism (HCC)   GI bleed   GI bleed Report hematochezia and dark stool -Denies abdominal pain -Hemoglobin stable at baseline -Currently on IV PPI, heparin drip, hold Eliquis -[facility] consulted, plan EGD and colonoscopy, likely on Tuesday   Recent history of bilateral PE, diagnosed in May 2022 Hold Eliquis On heparin drip  Chronic normocytic anemia Hemoglobin around 9 in last 64-months between 11 and 13 in March 2022 Monitor CBC  Hypomagnesemia Replace mag  COPD, not on home o2 No wheezing, no cough, no hypoxia   Insulin-dependent type 2 diabetes Most recent A1c 8.1 in May/2022 Long-acting insulin held, on SSI  Hyperlipidemia Continue Lipitor  Failure to thrive, severe rheumatoid arthritis, wheelchair-bound, limited mobility, but lives by herself and still drives   Body mass index is 29.55 kg/m.Marland Kitchen    Skin Assessment:  I have examined the patient's skin and I agree with the wound assessment as  performed by the wound care RN as outlined below:  Pressure Injury 08/19/20 Coccyx Mid Stage 1 -  Intact skin with non-blanchable redness of a localized area usually over a bony prominence. redness (Active)  08/19/20 2213  Location: Coccyx  Location Orientation: Mid  Staging: Stage 1 -  Intact skin with non-blanchable redness of a localized area usually over a bony prominence.  Wound Description (Comments): redness  Present on Admission: Yes    Unresulted Labs (From admission, onward)     Start     Ordered   08/21/20 0500  CBC  Daily,   R     Question:  Specimen collection method  Answer:  Lab=Lab collect   08/19/20 2359   08/20/20 2300  Heparin level (unfractionated)  Once-Timed,   TIMED       Question:  Specimen collection method  Answer:  Lab=Lab collect   08/20/20 1640              DVT prophylaxis: SCDs Start: 08/19/20 2219   Code Status: DNR Family Communication: Patient Disposition:   Status is: Inpatient    Dispo: The patient is from: home , lives along, wheelchair-bound, still drives              Anticipated d/c is to: home with home health, need PT eval              Anticipated d/c date is: possible Tuesday after endoscope, needs GI clearance                 Consultants:  GI  Procedures:  none  Antimicrobials:  Anti-infectives (From admission, onward)    None           Objective: Vitals:   08/20/20 0904 08/20/20 1007 08/20/20 1451 08/20/20 2028  BP:  135/65 (!) 110/59 122/73  Pulse:  85 87 90  Resp:  18 16 15   Temp:  97.7 F (36.5 C) (!) 97.5 F (36.4 C) 98.2 F (36.8 C)  TempSrc:   Oral Oral  SpO2: 100% 100% 97% 100%  Weight:        Intake/Output Summary (Last 24 hours) at 08/20/2020 2144 Last data filed at 08/20/2020 1900 Gross per 24 hour  Intake 316.08 ml  Output --  Net 316.08 ml   Filed Weights   08/20/20 0000  Weight: 83 kg    Examination:  General exam: calm, NAD Respiratory system: Clear to auscultation.  Respiratory effort normal. Cardiovascular system: S1 & S2 heard, RRR. No JVD, no murmur, No pedal edema. Gastrointestinal system: Abdomen is nondistended, soft and nontender. Normal bowel sounds heard. Central nervous system: Alert and oriented. No focal neurological deficits. Extremities: chronic deformities in hands and feet Skin: No rashes, lesions or ulcers Psychiatry: Judgement and insight appear normal. Mood & affect appropriate.     Data Reviewed: I have personally reviewed following labs and imaging studies  CBC: Recent Labs  Lab 08/19/20 2248 08/20/20 0503 08/20/20 0914  WBC 9.2 9.0  --   HGB 9.0* 9.3* 9.2*  HCT 30.5* 31.7* 31.0*  MCV 83.1 82.3  --   PLT 324 334  --     Basic Metabolic Panel: Recent Labs  Lab 08/19/20 2248 08/20/20 0503  NA 138 140  K 3.6 4.0  CL 106 107  CO2 24 25  GLUCOSE 154* 202*  BUN 7* 6*  CREATININE 0.71 0.67  CALCIUM 7.8* 8.0*  MG 1.5* 1.5*  PHOS  --  3.2    GFR: Estimated Creatinine Clearance: 61.9 mL/min (by C-G formula based on SCr of 0.67 mg/dL).  Liver Function Tests: Recent Labs  Lab 08/19/20 2248 08/20/20 0503  AST 11* 9*  ALT 10 11  ALKPHOS 65 62  BILITOT 0.7 0.6  PROT 5.7* 5.7*  ALBUMIN 2.6* 2.5*    CBG: Recent Labs  Lab 08/19/20 2351 08/20/20 0545 08/20/20 0749 08/20/20 1147 08/20/20 1637  GLUCAP 137* 193* 166* 121* 123*     Recent Results (from the past 240 hour(s))  Resp Panel by RT-PCR (Flu A&B, Covid) Nasopharyngeal Swab     Status: None   Collection Time: 08/19/20 10:24 PM   Specimen: Nasopharyngeal Swab; Nasopharyngeal(NP) swabs in vial transport medium  Result Value Ref Range Status   SARS Coronavirus 2 by RT PCR NEGATIVE NEGATIVE Final    Comment: (NOTE) SARS-CoV-2 target nucleic acids are NOT DETECTED.  The SARS-CoV-2 RNA is generally detectable in upper respiratory specimens during the acute phase of infection. The lowest concentration of SARS-CoV-2 viral copies this assay can detect  is 138 copies/mL. A negative result does not preclude SARS-Cov-2 infection and should not be used as the sole basis for treatment or other patient management decisions. A negative result may occur with  improper specimen collection/handling, submission of specimen other than nasopharyngeal swab, presence of viral mutation(s) within the areas targeted by this assay, and inadequate number of viral copies(<138 copies/mL). A negative result must be combined with clinical observations, patient history, and epidemiological information. The expected result is Negative.  Fact Sheet for Patients:  EntrepreneurPulse.com.au  Fact Sheet for Healthcare Providers:  IncredibleEmployment.be  This test is  no t yet approved or cleared by the [place] and  has been authorized for detection and/or diagnosis of SARS-CoV-2 by FDA under an Emergency Use Authorization (EUA). This EUA will remain  in effect (meaning this test can be used) for the duration of the COVID-19 declaration under Section 564(b)(1) of the Act, 21 U.S.C.section 360bbb-3(b)(1), unless the authorization is terminated  or revoked sooner.       Influenza A by PCR NEGATIVE NEGATIVE Final   Influenza B by PCR NEGATIVE NEGATIVE Final    Comment: (NOTE) The Xpert Xpress SARS-CoV-2/FLU/RSV plus assay is intended as an aid in the diagnosis of influenza from Nasopharyngeal swab specimens and should not be used as a sole basis for treatment. Nasal washings and aspirates are unacceptable for Xpert Xpress SARS-CoV-2/FLU/RSV testing.  Fact Sheet for Patients: EntrepreneurPulse.com.au  Fact Sheet for Healthcare Providers: IncredibleEmployment.be  This test is not yet approved or cleared by the [place] FDA and has been authorized for detection and/or diagnosis of SARS-CoV-2 by FDA under an Emergency Use Authorization (EUA). This EUA will remain in effect  (meaning this test can be used) for the duration of the COVID-19 declaration under Section 564(b)(1) of the Act, 21 U.S.C. section 360bbb-3(b)(1), unless the authorization is terminated or revoked.  Performed at [facility], [place] [address], [place], [place] [address]          Radiology Studies: No results found.      Scheduled Meds:  arformoterol  15 mcg Nebulization Daily   atorvastatin  20 mg Oral QHS   ezetimibe  10 mg Oral QHS   insulin aspart  0-6 Units Subcutaneous Q6H   [START ON 08/21/2020] linaclotide  290 mcg Oral Once   pantoprazole (PROTONIX) IV  40 mg Intravenous Q24H   [START ON 08/21/2020] polyethylene glycol-electrolytes  4,000 mL Oral Once   predniSONE  5 mg Oral QHS   Continuous Infusions:  heparin 1,150 Units/hr (08/20/20 1444)     LOS: 1 day   Time spent: 56mins Greater than 50% of this time was spent in counseling, explanation of diagnosis, planning of further management, and coordination of care.   Voice Recognition Viviann Spare dictation system was used to create this note, attempts have been made to correct errors. Please contact the author with questions and/or clarifications.   Florencia Reasons, MD PhD FACP [facility]  Available via Epic secure chat 7am-7pm for nonurgent issues Please page for urgent issues To page the attending provider between 7A-7P or the covering provider during after hours 7P-7A, please log into the web site [email or web address] and access using universal [place] password for that web site. If you do not have the password, please call the hospital operator.    08/20/2020, 9:44 PM

## 2020-08-21 LAB — BASIC METABOLIC PANEL
Anion gap: 7 (ref 5–15)
BUN: 11 mg/dL (ref 8–23)
CO2: 25 mmol/L (ref 22–32)
Calcium: 8.4 mg/dL — ABNORMAL LOW (ref 8.9–10.3)
Chloride: 107 mmol/L (ref 98–111)
Creatinine, Ser: 0.78 mg/dL (ref 0.44–1.00)
GFR, Estimated: >60 mL/min (ref >60)
Glucose, Bld: 182 mg/dL — ABNORMAL HIGH (ref 70–99)
Potassium: 4.5 mmol/L (ref 3.5–5.1)
Sodium: 139 mmol/L (ref 135–145)

## 2020-08-21 LAB — CBC
HCT: 31.5 % — ABNORMAL LOW (ref 36.0–46.0)
Hemoglobin: 9.1 g/dL — ABNORMAL LOW (ref 12.0–15.0)
MCH: 24.1 pg — ABNORMAL LOW (ref 26.0–34.0)
MCHC: 28.9 g/dL — ABNORMAL LOW (ref 30.0–36.0)
MCV: 83.3 fL (ref 80.0–100.0)
Platelets: 379 10*3/uL (ref 150–400)
RBC: 3.78 MIL/uL — ABNORMAL LOW (ref 3.87–5.11)
RDW: 18.6 % — ABNORMAL HIGH (ref 11.5–15.5)
WBC: 9.5 10*3/uL (ref 4.0–10.5)
nRBC: 0 % (ref 0.0–0.2)

## 2020-08-21 LAB — GLUCOSE, CAPILLARY
Glucose-Capillary: 110 mg/dL — ABNORMAL HIGH (ref 70–99)
Glucose-Capillary: 121 mg/dL — ABNORMAL HIGH (ref 70–99)
Glucose-Capillary: 123 mg/dL — ABNORMAL HIGH (ref 70–99)
Glucose-Capillary: 140 mg/dL — ABNORMAL HIGH (ref 70–99)

## 2020-08-21 LAB — HEPARIN LEVEL (UNFRACTIONATED)
Heparin Unfractionated: 0.32 IU/mL (ref 0.30–0.70)
Heparin Unfractionated: 0.3 IU/mL (ref 0.30–0.70)

## 2020-08-21 LAB — MAGNESIUM: Magnesium: 2 mg/dL (ref 1.7–2.4)

## 2020-08-21 MED ORDER — PHENYLEPHRINE-MINERAL OIL-PET 0.25-14-74.9 % RE OINT
1.0000 "application " | TOPICAL_OINTMENT | Freq: Two times a day (BID) | RECTAL | Status: DC | PRN
  Administered 2020-08-21 – 2020-08-22 (×2): 1 via RECTAL
  Filled 2020-08-21: qty 57

## 2020-08-21 MED ORDER — PEG 3350-KCL-NA BICARB-NACL 420 G PO SOLR
4000.0000 mL | Freq: Once | ORAL | Status: AC
  Administered 2020-08-21: 4000 mL via ORAL

## 2020-08-21 MED ORDER — INSULIN ASPART 100 UNIT/ML IJ SOLN
0.0000 [IU] | Freq: Four times a day (QID) | INTRAMUSCULAR | Status: DC
  Administered 2020-08-22: 1 [IU] via SUBCUTANEOUS

## 2020-08-21 NOTE — Progress Notes (Signed)
[place] for Heparin Indication: pulmonary embolus  Allergies  Allergen Reactions   Penicillins Hives    Has patient had a PCN reaction causing immediate rash, facial/tongue/throat swelling, SOB or lightheadedness with hypotension: Yes Has patient had a PCN reaction causing severe rash involving mucus membranes or skin necrosis: No Has patient had a PCN reaction that required hospitalization: No Has patient had a PCN reaction occurring within the last 10 years: No If all of the above answers are "NO", then may proceed with Cephalosporin use. Patient has tolerated ceftriaxone in the recent past.   Sulfamethoxazole-Trimethoprim Nausea And Vomiting and Other (See Comments)    Stomach problems   Meperidine Other (See Comments)    Cramping   Meperidine Hcl Other (See Comments)    Cramping    Penicillin G Sodium Hives   Sulfa Antibiotics Nausea And Vomiting and Other (See Comments)    Stomach problems/GI upset   Sulfonamide Derivatives Nausea And Vomiting and Other (See Comments)    "Stomach problems"    Patient Measurements: Weight: 85.6 kg (188 lb 11.4 oz) Heparin Dosing Weight: 76 kg  Vital Signs: Temp: 97.8 F (36.6 C) (06/13 0349) Temp Source: Oral (06/13 0349) BP: 129/70 (06/13 0349) Pulse Rate: 94 (06/13 0349)  Labs: Recent Labs    08/19/20 2248 08/19/20 2248 08/20/20 0503 08/20/20 0914 08/20/20 1510 08/20/20 2324 08/21/20 0450  HGB 9.0*  --  9.3* 9.2*  --   --  9.1*  HCT 30.5*  --  31.7* 31.0*  --   --  31.5*  PLT 324  --  334  --   --   --  379  APTT  --   --  44*  --   --   --   --   LABPROT 13.8  --  13.6  --   --   --   --   INR 1.1  --  1.0  --   --   --   --   HEPARINUNFRC  --    < > 0.15*  --  0.33 0.32 0.30  CREATININE 0.71  --  0.67  --   --   --  0.78   < > = values in this interval not displayed.     Estimated Creatinine Clearance: 62.8 mL/min (by C-G formula based on SCr of 0.78 mg/dL).   Medications:   PTA Apixaban (pt reports last dose taken 6/9 AM) IV heparin started at [facility] on 6/10  Assessment: 80 yr female transferred from [facility] (arrived at [place] on 6/9) PMH significant for bilateral PE (dx 07/2020) on Apixaban, COPD, DM, RA, chronic anemia Admitted with lower GI bleed Pharmacy consulted for IV heparin dosing on admission. No bolus per MD.   08/21/20 Heparin level = 0.3 units/mL, now therapeutic on 1150 units/hr Hgb = 9.1 (low but stable), Pltc WNL Patient with GI bleed-plan for GI procedures likely on Tuesday  Goal of Therapy:  Heparin level 0.3-0.5 units/ml (target lower end of therapeutic range per Dr. Erlinda Hong) Monitor platelets by anticoagulation protocol: Yes   Plan:  Continue heparin infusion at 1150 units/hr Daily heparin level & CBC while on IV heparin F/u plans for holding heparin drip prior to procedure  Peggyann Juba, PharmD, BCPS Pharmacy: [phone] 08/21/2020,7:29 AM

## 2020-08-21 NOTE — Plan of Care (Signed)
  Problem: Pain Managment: Goal: General experience of comfort will improve Outcome: Progressing   Problem: Safety: Goal: Ability to remain free from injury will improve Outcome: Progressing   Problem: Skin Integrity: Goal: Risk for impaired skin integrity will decrease Outcome: Progressing   Problem: Bowel/Gastric: Goal: Will show no signs and symptoms of gastrointestinal bleeding Outcome: Progressing

## 2020-08-21 NOTE — Progress Notes (Signed)
[place] for Heparin Indication: pulmonary embolus  Allergies  Allergen Reactions   Penicillins Hives    Has patient had a PCN reaction causing immediate rash, facial/tongue/throat swelling, SOB or lightheadedness with hypotension: Yes Has patient had a PCN reaction causing severe rash involving mucus membranes or skin necrosis: No Has patient had a PCN reaction that required hospitalization: No Has patient had a PCN reaction occurring within the last 10 years: No If all of the above answers are "NO", then may proceed with Cephalosporin use. Patient has tolerated ceftriaxone in the recent past.   Sulfamethoxazole-Trimethoprim Nausea And Vomiting and Other (See Comments)    Stomach problems   Meperidine Other (See Comments)    Cramping   Meperidine Hcl Other (See Comments)    Cramping    Penicillin G Sodium Hives   Sulfa Antibiotics Nausea And Vomiting and Other (See Comments)    Stomach problems/GI upset   Sulfonamide Derivatives Nausea And Vomiting and Other (See Comments)    "Stomach problems"    Patient Measurements: Weight: 83 kg (182 lb 15.7 oz) Heparin Dosing Weight: 76 kg  Vital Signs: Temp: 98.2 F (36.8 C) (06/12 2028) Temp Source: Oral (06/12 2028) BP: 122/73 (06/12 2028) Pulse Rate: 90 (06/12 2028)  Labs: Recent Labs    08/19/20 2248 08/20/20 0503 08/20/20 0914 08/20/20 1510 08/20/20 2324  HGB 9.0* 9.3* 9.2*  --   --   HCT 30.5* 31.7* 31.0*  --   --   PLT 324 334  --   --   --   APTT  --  44*  --   --   --   LABPROT 13.8 13.6  --   --   --   INR 1.1 1.0  --   --   --   HEPARINUNFRC  --  0.15*  --  0.33 0.32  CREATININE 0.71 0.67  --   --   --      Estimated Creatinine Clearance: 61.9 mL/min (by C-G formula based on SCr of 0.67 mg/dL).   Medical History: Past Medical History:  Diagnosis Date   Acute pulmonary embolism ([place])    b/l; dx'ed May '22   Asthma    Complication of anesthesia    Anesthesia  told her years ago she got too cold during surgery   Diabetes mellitus without complication ([place])    Emphysema    Fibromyalgia    Osteoporosis    Rheumatoid arthritis(714.0)    Sleep apnea    no cpap    Medications:  PTA Apixaban (pt reports last dose taken 6/9 AM) IV heparin started at [facility] on 6/10  Assessment: 80 yr female transferred from [facility] (arrived at [place] on 6/9) PMH significant for bilateral PE (dx 07/2020) on Apixaban, COPD, DM, RA, chronic anemia Admitted with lower GI bleed Pharmacy consulted for IV heparin dosing on admission. No bolus per MD.   08/21/20 AM heparin level and aPTT correlate, so now monitoring/titrating heparin infusion based on heparin levels only  1510 Heparin level = 0.33 units/mL, now therapeutic Hgb = 9.2, Pltc WNL Patient with GI bleed-plan for GI procedures likely on Tuesday  Evening follow up: Heparin level = 0.32 (therapeutic) with heparin @ 1150 units/hr No new events noted  Goal of Therapy:  Heparin level 0.3-0.5 units/ml (target lower end of therapeutic range per Dr. Erlinda Hong) Monitor platelets by anticoagulation protocol: Yes   Plan:  Continue heparin infusion at 1150 units/hr Daily heparin level &  CBC while on IV heparin   Leone Haven, PharmD 08/21/2020,12:55 AM

## 2020-08-21 NOTE — TOC Initial Note (Signed)
Transition of Care [facility]) - Initial/Assessment Note    Patient Details  Name: [name] MRN: [id number] Date of Birth: 1940/06/27  [facility]) CM/SW Contact:    Illene Regulus, LCSW Phone Number: 08/21/2020, 3:00 PM  Clinical Narrative:                 Patient is a 80 year old female orientated x 4. CSW spoke with patient about dc plans. Patent reported wanting to go back home were she lives alone. Patient reported she is already set up with [facility] for PT OT and RN. CSW confirmed active status with Malachy Mood from [facility]. Patient reported needing transportation from the hospital to her home at d/c. Patient reported no DME needs. CSW spoke with patient about asking for help from social supports, to check in on her  when she is d/c from the hospital.CSW will follow.  Expected Discharge Plan: Home/Self Care Barriers to Discharge: No Barriers Identified   Patient Goals and CMS Choice Patient states their goals for this hospitalization and ongoing recovery are:: Plans to go home with Home Health.   Choice offered to / list presented to : NA  Expected Discharge Plan and Services Expected Discharge Plan: Home/Self Care       Living arrangements for the past 2 months: Single Family Home                           HH Arranged: RN, PT, OT [facility] Agency: [place] Date [place]: 08/21/20 Time [place]: [place] Representative spoke with at [place]: Malachy Mood  Prior Living Arrangements/Services Living arrangements for the past 2 months: [place] with:: Self Patient language and need for interpreter reviewed:: Yes Do you feel safe going back to the place where you live?: Yes      Need for Family Participation in Patient Care: No (Comment) Care giver support system in place?: Yes (comment) Current home services: Home OT, Home PT, Home RN Criminal Activity/Legal Involvement Pertinent to Current  Situation/Hospitalization: No - Comment as needed  Activities of Daily Living Home Assistive Devices/Equipment: Wheelchair ADL Screening (condition at time of admission) Patient's cognitive ability adequate to safely complete daily activities?: Yes Is the patient deaf or have difficulty hearing?: Yes Does the patient have difficulty seeing, even when wearing glasses/contacts?: No Does the patient have difficulty concentrating, remembering, or making decisions?: No Patient able to express need for assistance with ADLs?: Yes Does the patient have difficulty dressing or bathing?: Yes Independently performs ADLs?: No Does the patient have difficulty walking or climbing stairs?: Yes Weakness of Legs: Both Weakness of Arms/Hands: Both  Permission Sought/Granted Permission sought to share information with : Case Manager Permission granted to share information with : Yes, Release of Information Signed           Permission granted to share info w Contact Information:  Quentin Ore [phone])  Emotional Assessment Appearance:: Appears older than stated age Attitude/Demeanor/Rapport: Engaged, Gracious Affect (typically observed): Accepting, Calm, Hopeful Orientation: : Oriented to Self, Oriented to Place, Oriented to  Time, Oriented to Situation   Psych Involvement: No (comment)  Admission diagnosis:  Acute lower GI bleeding [K92.2] GI bleed [K92.2] Patient Active Problem List   Diagnosis Date Noted   GI bleed 08/20/2020   Acute lower GI bleeding 08/19/2020   Acute pulmonary embolism ([place]) 07/21/2020   Hypoxia 05/23/2020   Acute hypoxemic respiratory failure (  [place]) 05/22/2020   Rotator cuff arthropathy of left shoulder 12/13/2019   Pain in joint of right shoulder 12/10/2019   Pain in joint of left shoulder 12/10/2019   Hearing loss, sensorineural, 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   Tinnitus, left 01/08/2019   DM type 2, not at goal [facility]) 06/03/2018   Frequent infections 05/21/2018    Gangrene ([place]) 05/21/2018   Pain in finger 05/07/2018   Family history of aneurysm 05/07/2018   Diabetes mellitus due to underlying condition, controlled, with complication, without long-term current use of insulin ([place])    Lower urinary tract infectious disease    Palpitations 01/17/2018   Dehydration 01/17/2018   Hypotension 01/17/2018   Symptom of blood in stool 01/17/2018   Diarrhea in adult patient 01/17/2018   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   Aftercare following joint replacement 08/21/2017   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 with presence of antibiotic-impregnated cement spacer 06/05/2017   Closed displaced transverse fracture of right patella 05/06/2017   Anemia 04/16/2017   Chronic interstitial lung disease ([place]) 04/16/2017   Chronic kidney disease (CKD), stage III (moderate) ([place]) 04/16/2017   Other hyperlipidemia 04/16/2017   Type 2 diabetes mellitus without complication, without long-term current use of insulin ([place]) 04/16/2017   Pain in right knee 03/25/2017   Aftercare 03/25/2017   Arthritis, septic, knee ([place]) 03/09/2017   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 [place] [address] Phone: [phone] Fax: [phone]     Social Determinants of Health (SDOH) Interventions     Readmission Risk Interventions No flowsheet data found.

## 2020-08-21 NOTE — Progress Notes (Signed)
[facility] Gastroenterology Progress Note  [name] 80 y.o. Jul 16, 1940  CC: Rectal bleeding, anemia  Subjective: Patient reports one loose melenic stool overnight. Denies abdominal pain, nausea, vomiting.  ROS : Review of Systems  Gastrointestinal:  Positive for melena. Negative for abdominal pain, blood in stool, constipation, diarrhea, heartburn, nausea and vomiting.  Musculoskeletal:  Positive for joint pain. Negative for myalgias.    Objective: Vital signs in last 24 hours: Vitals:   08/20/20 2028 08/21/20 0349  BP: 122/73 129/70  Pulse: 90 94  Resp: 15 18  Temp: 98.2 F (36.8 C) 97.8 F (36.6 C)  SpO2: 100% 98%    Physical Exam:  General:  Lying in bed in no acute distress, oriented and cooperative  Head:  Normocephalic, without obvious abnormality, atraumatic  Eyes:  Anicteric sclera, EOMs intact   Lungs:   Clear to auscultation bilaterally, respirations unlabored  Heart:  Regular rate and rhythm, S1, S2 normal  Abdomen:   Soft, non-tender, non-distended, bowel sounds active all four quadrants  Extremities: Chronic deformities in hands due to rheumatoid arthritis    Lab Results: Recent Labs    08/20/20 0503 08/21/20 0450  NA 140 139  K 4.0 4.5  CL 107 107  CO2 25 25  GLUCOSE 202* 182*  BUN 6* 11  CREATININE 0.67 0.78  CALCIUM 8.0* 8.4*  MG 1.5* 2.0  PHOS 3.2  --    Recent Labs    08/19/20 2248 08/20/20 0503  AST 11* 9*  ALT 10 11  ALKPHOS 65 62  BILITOT 0.7 0.6  PROT 5.7* 5.7*  ALBUMIN 2.6* 2.5*   Recent Labs    08/20/20 0503 08/20/20 0914 08/21/20 0450  WBC 9.0  --  9.5  HGB 9.3* 9.2* 9.1*  HCT 31.7* 31.0* 31.5*  MCV 82.3  --  83.3  PLT 334  --  379   Recent Labs    08/19/20 2248 08/20/20 0503  LABPROT 13.8 13.6  INR 1.1 1.0    Assessment: Rectal bleeding, anemia -Hgb 9.1, stable -Normal BUN (11) and Cr (0.78) -Known diverticulosis, last colonoscopy 2012 with 1 tubular adenoma  Bilateral pulmonary embolism (07/2020), on  Heparin while admitted (Eliquis as an outpatient)  Rheumatoid arthritis  Plan: EGD and colonoscopy tomorrow.  We thoroughly discussed the procedure with the patient to include nature, alternatives, benefits, and risks (including but not limited to bleeding, infection, perforation, anesthesia/cardiac and pulmonary complications).  Patient verbalized understanding gave verbal consent to proceed with EGD and colonoscopy.  Clear liquid diet with Nulytely prep today.  N.p.o. after midnight.  Stop heparin by 08:30 tomorrow morning Tuesday 6/14.  Continue to monitor H&H with transfusion as needed to maintain hemoglobin greater than 7.  Continue Protonix daily.  [facility] will follow.   Salley Slaughter PA-C 08/21/2020, 9:28 AM  Contact #  [phone]

## 2020-08-21 NOTE — Plan of Care (Signed)

## 2020-08-21 NOTE — Progress Notes (Signed)
PROGRESS NOTE    [name]  XKG:[id number] DOB: [birth year] DOA: 08/19/2020 PCP: [name], MD   Chief Complain: Hematochezia/dark stool  Brief Narrative: Patient is a 80 year-old female with history of PE currently on Eliquis, COPD, type 2 diabetes, rheumatoid arthritis on chronic low-dose prednisone therapy who presented with hematochezia, dark stools.  Denies any abdominal pain, nausea or vomiting on presentation.  Hemoglobin is currently stable, no active bleeding at present.  Currently on IV PPI.  Also on heparin drip for PE.  [facility] consulted, plan for EGD/colonoscopy tomorrow.  Assessment & Plan:   Principal Problem:   Acute lower GI bleeding Active Problems:   GERD   Rheumatoid arthritis ([place])   Diabetes mellitus due to underlying condition, controlled, with complication, without long-term current use of insulin (HCC)   Other hyperlipidemia   Acute pulmonary embolism (HCC)   GI bleed   Acute GI bleed: Presented with hematochezia, diarrhea stools.  No abdominal pain, nausea or vomiting.  Hemoglobin currently stable at baseline.  On IV PPI.  Plan for EGD/colonoscopy tomorrow.  N.p.o. after midnight.  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  On Eliquis at home.  On heparin drip currently  Chronic normocytic anemia: Baseline hemoglobin around 9.  Continue to monitor.  COPD: Not on home oxygen.  Not on exacerbation at present.  Continue bronchodilators as needed.  Insulin-dependent type 2 diabetes: Recent hemoglobin A1c of 8.1.  Currently on sliding scale insulin.  Hyperlipidemia: On Lipitor  History of rheumatoid arthritis/debility/deconditioning: Follow-up with rheumatology as an outpatient recommended.  Patient is wheelchair-bound, limited mobility         DVT prophylaxis:Heparin IV Code Status: DNR Family Communication: None at bedside Status is: Inpatient  Remains inpatient appropriate because:IV treatments appropriate due to intensity of  illness or inability to take PO  Dispo: The patient is from: Home              Anticipated d/c is to: Home              Patient currently is not medically stable to d/c.   Difficult to place patient No     Consultants: GI  Procedures:None  Antimicrobials:  Anti-infectives (From admission, onward)    None       Subjective:  Patient seen and examined the bedside this morning.  Hemodynamically stable.  Comfortable.  Denies any abdomen pain, nausea or vomiting.  Last bowel movement had dark stool.  Objective: Vitals:   08/20/20 1451 08/20/20 2028 08/21/20 0349 08/21/20 0500  BP: (!) 110/59 122/73 129/70   Pulse: 87 90 94   Resp: 16 15 18    Temp: (!) 97.5 F (36.4 C) 98.2 F (36.8 C) 97.8 F (36.6 C)   TempSrc: Oral Oral Oral   SpO2: 97% 100% 98%   Weight:    85.6 kg    Intake/Output Summary (Last 24 hours) at 08/21/2020 1236 Last data filed at 08/21/2020 7026 Gross per 24 hour  Intake 931.15 ml  Output 700 ml  Net 231.15 ml   Filed Weights   08/20/20 0000 08/21/20 0500  Weight: 83 kg 85.6 kg    Examination:  General exam: Appears calm and comfortable ,Not in distress, pleasant elderly female  HEENT:PERRL,Oral mucosa moist, Ear/Nose normal on gross exam Respiratory system: Bilateral equal air entry, normal vesicular breath sounds, no wheezes or crackles  Cardiovascular system: S1 & S2 heard, RRR. No JVD, murmurs, rubs, gallops or clicks. No pedal edema. Gastrointestinal system:  Abdomen is nondistended, soft and nontender. No organomegaly or masses felt. Normal bowel sounds heard. Central nervous system: Alert and oriented. No focal neurological deficits. Extremities: No edema, no clubbing ,no cyanosis, deformities of the hands ` skin: No rashes, lesions or ulcers,no icterus ,no pallor   Data Reviewed: I have personally reviewed following labs and imaging studies  CBC: Recent Labs  Lab 08/19/20 2248 08/20/20 0503 08/20/20 0914 08/21/20 0450  WBC 9.2  9.0  --  9.5  HGB 9.0* 9.3* 9.2* 9.1*  HCT 30.5* 31.7* 31.0* 31.5*  MCV 83.1 82.3  --  83.3  PLT 324 334  --  644   Basic Metabolic Panel: Recent Labs  Lab 08/19/20 2248 08/20/20 0503 08/21/20 0450  NA 138 140 139  K 3.6 4.0 4.5  CL 106 107 107  CO2 24 25 25   GLUCOSE 154* 202* 182*  BUN 7* 6* 11  CREATININE 0.71 0.67 0.78  CALCIUM 7.8* 8.0* 8.4*  MG 1.5* 1.5* 2.0  PHOS  --  3.2  --    GFR: Estimated Creatinine Clearance: 62.8 mL/min (by C-G formula based on SCr of 0.78 mg/dL). Liver Function Tests: Recent Labs  Lab 08/19/20 2248 08/20/20 0503  AST 11* 9*  ALT 10 11  ALKPHOS 65 62  BILITOT 0.7 0.6  PROT 5.7* 5.7*  ALBUMIN 2.6* 2.5*   No results for input(s): LIPASE, AMYLASE in the last 168 hours. No results for input(s): AMMONIA in the last 168 hours. Coagulation Profile: Recent Labs  Lab 08/19/20 2248 08/20/20 0503  INR 1.1 1.0   Cardiac Enzymes: No results for input(s): CKTOTAL, CKMB, CKMBINDEX, TROPONINI in the last 168 hours. BNP (last 3 results) No results for input(s): PROBNP in the last 8760 hours. HbA1C: No results for input(s): HGBA1C in the last 72 hours. CBG: Recent Labs  Lab 08/20/20 1147 08/20/20 1637 08/20/20 2306 08/21/20 0614 08/21/20 1158  GLUCAP 121* 123* 147* 140* 123*   Lipid Profile: No results for input(s): CHOL, HDL, LDLCALC, TRIG, CHOLHDL, LDLDIRECT in the last 72 hours. Thyroid Function Tests: No results for input(s): TSH, T4TOTAL, FREET4, T3FREE, THYROIDAB in the last 72 hours. Anemia Panel: No results for input(s): VITAMINB12, FOLATE, FERRITIN, TIBC, IRON, RETICCTPCT in the last 72 hours. Sepsis Labs: No results for input(s): PROCALCITON, LATICACIDVEN in the last 168 hours.  Recent Results (from the past 240 hour(s))  Resp Panel by RT-PCR (Flu A&B, Covid) Nasopharyngeal Swab     Status: None   Collection Time: 08/19/20 10:24 PM   Specimen: Nasopharyngeal Swab; Nasopharyngeal(NP) swabs in vial transport medium  Result  Value Ref Range Status   SARS Coronavirus 2 by RT PCR NEGATIVE NEGATIVE Final    Comment: (NOTE) SARS-CoV-2 target nucleic acids are NOT DETECTED.  The SARS-CoV-2 RNA is generally detectable in upper respiratory specimens during the acute phase of infection. The lowest concentration of SARS-CoV-2 viral copies this assay can detect is 138 copies/mL. A negative result does not preclude SARS-Cov-2 infection and should not be used as the sole basis for treatment or other patient management decisions. A negative result may occur with  improper specimen collection/handling, submission of specimen other than nasopharyngeal swab, presence of viral mutation(s) within the areas targeted by this assay, and inadequate number of viral copies(<138 copies/mL). A negative result must be combined with clinical observations, patient history, and epidemiological information. The expected result is Negative.  Fact Sheet for Patients:  EntrepreneurPulse.com.au  Fact Sheet for Healthcare Providers:  IncredibleEmployment.be  This test is no t  yet approved or cleared by the [place] and  has been authorized for detection and/or diagnosis of SARS-CoV-2 by FDA under an Emergency Use Authorization (EUA). This EUA will remain  in effect (meaning this test can be used) for the duration of the COVID-19 declaration under Section 564(b)(1) of the Act, 21 U.S.C.section 360bbb-3(b)(1), unless the authorization is terminated  or revoked sooner.       Influenza A by PCR NEGATIVE NEGATIVE Final   Influenza B by PCR NEGATIVE NEGATIVE Final    Comment: (NOTE) The Xpert Xpress SARS-CoV-2/FLU/RSV plus assay is intended as an aid in the diagnosis of influenza from Nasopharyngeal swab specimens and should not be used as a sole basis for treatment. Nasal washings and aspirates are unacceptable for Xpert Xpress SARS-CoV-2/FLU/RSV testing.  Fact Sheet for  Patients: EntrepreneurPulse.com.au  Fact Sheet for Healthcare Providers: IncredibleEmployment.be  This test is not yet approved or cleared by the [place] FDA and has been authorized for detection and/or diagnosis of SARS-CoV-2 by FDA under an Emergency Use Authorization (EUA). This EUA will remain in effect (meaning this test can be used) for the duration of the COVID-19 declaration under Section 564(b)(1) of the Act, 21 U.S.C. section 360bbb-3(b)(1), unless the authorization is terminated or revoked.  Performed at [facility], [place] [address] [place], [place] [address]          Radiology Studies: No results found.      Scheduled Meds:  arformoterol  15 mcg Nebulization Daily   atorvastatin  20 mg Oral QHS   ezetimibe  10 mg Oral QHS   insulin aspart  0-6 Units Subcutaneous Q6H   pantoprazole (PROTONIX) IV  40 mg Intravenous Q24H   predniSONE  5 mg Oral QHS   Continuous Infusions:  heparin 1,150 Units/hr (08/21/20 0425)     LOS: 1 day    Time spent: 25 mins.More than 50% of that time was spent in counseling and/or coordination of care.      Shelly Coss, MD [facility] P6/13/2022, 12:36 PM

## 2020-08-21 NOTE — Evaluation (Signed)
Physical Therapy Evaluation Patient Details Name: [name] MRN: [id number] DOB: 14-Dec-1940 Today's Date: 08/21/2020   History of Present Illness  80 y.o. female admitted 08/19/20 for hematochezia and dark stool. Past medical history significant for acute bilateral pulmonary embolism diagnosed in May 2022 subsequently anticoagulated on Eliquis, COPD, type 2 diabetes mellitus, rheumatoid arthritis on chronic low-dose prednisone therapy  Clinical Impression  Pt admitted with above diagnosis. Pt currently with functional limitations due to the deficits listed below (see PT Problem List). Pt will benefit from skilled PT to increase their independence and safety with mobility to allow discharge to the venue listed below.  Pt assisted to/from [facility] (taking prep for colonoscopy).  Pt currently requiring min assist for transfers.  Pt needed assist for pericare however reports she uses bidet at home.  Pt recently back home from SNF for rehab and plans to return home upon d/c this admission.  Pt typically uses power w/c at baseline however reports she can walk a few steps with RW as well.     Follow Up Recommendations Home health PT;Supervision - Intermittent    Equipment Recommendations  None recommended by PT    Recommendations for Other Services       Precautions / Restrictions Precautions Precautions: Fall      Mobility  Bed Mobility Overal bed mobility: Needs Assistance Bed Mobility: Supine to Sit;Sit to Supine     Supine to sit: Supervision;HOB elevated Sit to supine: Supervision;HOB elevated   General bed mobility comments: increased time and effort    Transfers Overall transfer level: Needs assistance Equipment used: None Transfers: Sit to/from [facility] Sit to Stand: Min assist Stand pivot transfers: Min assist       General transfer comment: verbal cues for hand placement, pt likes BSC right in front of her, SPO2 92% on room air with transfer so kept O2  [place] off pt and RN aware  Ambulation/Gait                Stairs            Wheelchair Mobility    Modified Rankin (Stroke Patients Only)       Balance Overall balance assessment: Needs assistance         Standing balance support: Bilateral upper extremity supported Standing balance-Leahy Scale: Poor Standing balance comment: reliant on UE support               High Level Balance Comments: pt denies any recent falls             Pertinent Vitals/Pain Pain Assessment: No/denies pain    Home Living Family/patient expects to be discharged to:: Private residence Living Arrangements: Alone Available Help at Discharge: Family;Available PRN/intermittently   Home Access: Ramped entrance     Home Layout: One level Home Equipment: Wheelchair - power;Walker - 4 wheels      Prior Function Level of Independence: Independent with assistive device(s)         Comments: son lives across the street, uses motorized WC, walks only very short distances with RW; independent with bathing/dressing, drives and uses motorized cart at grocery store     Hand Dominance        Extremity/Trunk Assessment   Upper Extremity Assessment Upper Extremity Assessment:  (bil hand deformities from RA)    Lower Extremity Assessment Lower Extremity Assessment: Generalized weakness       Communication   Communication: HOH  Cognition Arousal/Alertness: Awake/alert Behavior During Therapy: [facility] for  tasks assessed/performed Overall Cognitive Status: Within Functional Limits for tasks assessed                                        General Comments      Exercises     Assessment/Plan    PT Assessment Patient needs continued PT services  PT Problem List Decreased strength;Decreased mobility;Decreased knowledge of use of DME;Decreased activity tolerance;Decreased balance       PT Treatment Interventions Gait training;DME instruction;Therapeutic  exercise;Functional mobility training;Therapeutic activities;Patient/family education;Balance training    PT Goals (Current goals can be found in the Care Plan section)  Acute Rehab PT Goals Patient Stated Goal: return home PT Goal Formulation: With patient Time For Goal Achievement: 09/04/20 Potential to Achieve Goals: Good    Frequency Min 3X/week   Barriers to discharge        Co-evaluation               AM-PAC PT "6 Clicks" Mobility  Outcome Measure Help needed turning from your back to your side while in a flat bed without using bedrails?: A Little Help needed moving from lying on your back to sitting on the side of a flat bed without using bedrails?: A Little Help needed moving to and from a bed to a chair (including a wheelchair)?: A Lot Help needed standing up from a chair using your arms (e.g., wheelchair or bedside chair)?: A Lot Help needed to walk in hospital room?: A Lot Help needed climbing 3-5 steps with a railing? : Total 6 Click Score: 13    End of Session Equipment Utilized During Treatment: Gait belt Activity Tolerance: Patient tolerated treatment well Patient left: with call bell/phone within reach;in bed;with bed alarm set Nurse Communication: Mobility status PT Visit Diagnosis: Other abnormalities of gait and mobility (R26.89)    Time: 1140-1200 PT Time Calculation (min) (ACUTE ONLY): 20 min   Charges:   PT Evaluation $PT Eval Low Complexity: 1 Low        Kati PT, DPT Acute Rehabilitation Services Pager: [phone] Office: [phone]  York Ram E 08/21/2020, 12:21 PM

## 2020-08-21 NOTE — Anesthesia Preprocedure Evaluation (Addendum)
Anesthesia Evaluation  Patient identified by MRN, date of birth, ID band Patient awake    Reviewed: Allergy & Precautions, NPO status , Patient's Chart, lab work & pertinent test results  Airway Mallampati: II  TM Distance: >3 FB Neck ROM: Full    Dental no notable dental hx. (+) Dental Advisory Given, Implants   Pulmonary sleep apnea , COPD, former smoker, PE   Pulmonary exam normal breath sounds clear to auscultation       Cardiovascular Normal cardiovascular exam Rhythm:Regular Rate:Normal     Neuro/Psych negative neurological ROS  negative psych ROS   GI/Hepatic Neg liver ROS, GERD  ,  Endo/Other  diabetes, Type 2  Renal/GU Renal disease     Musculoskeletal  (+) Arthritis , Rheumatoid disorders,    Abdominal (+) + obese,   Peds  Hematology  (+) Blood dyscrasia, anemia ,   Anesthesia Other Findings   Reproductive/Obstetrics                           Anesthesia Physical Anesthesia Plan  ASA: 3  Anesthesia Plan: MAC   Post-op Pain Management:    Induction:   PONV Risk Score and Plan: Treatment may vary due to age or medical condition  Airway Management Planned: Natural Airway  Additional Equipment: None  Intra-op Plan:   Post-operative Plan:   Informed Consent: I have reviewed the patients History and Physical, chart, labs and discussed the procedure including the risks, benefits and alternatives for the proposed anesthesia with the patient or authorized representative who has indicated his/her understanding and acceptance.     Dental advisory given  Plan Discussed with:   Anesthesia Plan Comments: (Anemia and Hematachezia for Colonoscopy)       Anesthesia Quick Evaluation

## 2020-08-22 ENCOUNTER — Inpatient Hospital Stay (HOSPITAL_COMMUNITY): Payer: Medicare Other | Admitting: Anesthesiology

## 2020-08-22 ENCOUNTER — Encounter (HOSPITAL_COMMUNITY): Payer: Self-pay | Admitting: Internal Medicine

## 2020-08-22 ENCOUNTER — Encounter (HOSPITAL_COMMUNITY)
Admission: AD | Disposition: A | Discharge status: Home / Self Care | Payer: Self-pay | Source: Other Acute Inpatient Hospital | Attending: Internal Medicine

## 2020-08-22 HISTORY — PX: ESOPHAGOGASTRODUODENOSCOPY (EGD) WITH PROPOFOL: SHX5813

## 2020-08-22 HISTORY — PX: COLONOSCOPY WITH PROPOFOL: SHX5780

## 2020-08-22 HISTORY — PX: POLYPECTOMY: SHX5525

## 2020-08-22 HISTORY — PX: HOT HEMOSTASIS: SHX5433

## 2020-08-22 LAB — GLUCOSE, CAPILLARY
Glucose-Capillary: 156 mg/dL — ABNORMAL HIGH (ref 70–99)
Glucose-Capillary: 124 mg/dL — ABNORMAL HIGH (ref 70–99)

## 2020-08-22 LAB — CBC
HCT: 32.4 % — ABNORMAL LOW (ref 36.0–46.0)
Hemoglobin: 9.5 g/dL — ABNORMAL LOW (ref 12.0–15.0)
MCH: 24.5 pg — ABNORMAL LOW (ref 26.0–34.0)
MCHC: 29.3 g/dL — ABNORMAL LOW (ref 30.0–36.0)
MCV: 83.5 fL (ref 80.0–100.0)
Platelets: 403 10*3/uL — ABNORMAL HIGH (ref 150–400)
RBC: 3.88 MIL/uL (ref 3.87–5.11)
RDW: 18.6 % — ABNORMAL HIGH (ref 11.5–15.5)
WBC: 8.5 10*3/uL (ref 4.0–10.5)
nRBC: 0 % (ref 0.0–0.2)

## 2020-08-22 LAB — HEPARIN LEVEL (UNFRACTIONATED): Heparin Unfractionated: 0.16 IU/mL — ABNORMAL LOW (ref 0.30–0.70)

## 2020-08-22 SURGERY — COLONOSCOPY WITH PROPOFOL
Anesthesia: Monitor Anesthesia Care

## 2020-08-22 MED ORDER — LACTATED RINGERS IV SOLN: INTRAVENOUS | Status: DC | PRN

## 2020-08-22 MED ORDER — PROPOFOL 500 MG/50ML IV EMUL
INTRAVENOUS | Status: DC | PRN
  Administered 2020-08-22: 125 ug/kg/min via INTRAVENOUS

## 2020-08-22 MED ORDER — PHENYLEPHRINE 40 MCG/ML (10ML) SYRINGE FOR IV PUSH (FOR BLOOD PRESSURE SUPPORT)
PREFILLED_SYRINGE | INTRAVENOUS | Status: DC | PRN
  Administered 2020-08-22: 80 ug via INTRAVENOUS
  Administered 2020-08-22: 120 ug via INTRAVENOUS
  Administered 2020-08-22: 80 ug via INTRAVENOUS

## 2020-08-22 MED ORDER — APIXABAN 5 MG PO TABS: 5.0000 mg | ORAL_TABLET | Freq: Two times a day (BID) | ORAL | 0 refills | Status: DC

## 2020-08-22 MED ORDER — DEXMEDETOMIDINE (PRECEDEX) IN NS 20 MCG/5ML (4 MCG/ML) IV SYRINGE
PREFILLED_SYRINGE | INTRAVENOUS | Status: DC | PRN
  Administered 2020-08-22 (×7): 4 ug via INTRAVENOUS

## 2020-08-22 MED ORDER — LIDOCAINE HCL (CARDIAC) PF 100 MG/5ML IV SOSY
PREFILLED_SYRINGE | INTRAVENOUS | Status: DC | PRN
  Administered 2020-08-22: 60 mg via INTRAVENOUS

## 2020-08-22 SURGICAL SUPPLY — 25 items

## 2020-08-22 NOTE — Anesthesia Postprocedure Evaluation (Signed)
Anesthesia Post Note  Patient: [name]  Procedure(s) Performed: COLONOSCOPY WITH PROPOFOL ESOPHAGOGASTRODUODENOSCOPY (EGD) WITH PROPOFOL POLYPECTOMY HOT HEMOSTASIS (ARGON PLASMA COAGULATION/BICAP)     Patient location during evaluation: Endoscopy Anesthesia Type: MAC Level of consciousness: awake and alert Pain management: pain level controlled Vital Signs Assessment: post-procedure vital signs reviewed and stable Respiratory status: spontaneous breathing, nonlabored ventilation, respiratory function stable and patient connected to nasal cannula oxygen Cardiovascular status: blood pressure returned to baseline and stable Postop Assessment: no apparent nausea or vomiting Anesthetic complications: no   No notable events documented.  Last Vitals:  Vitals:   08/22/20 1520 08/22/20 1530  BP: (!) 118/47 (!) 126/54  Pulse: 84 82  Resp: (!) 27 (!) 21  Temp:    SpO2: 96% 92%    Last Pain:  Vitals:   08/22/20 1520  TempSrc:   PainSc: 0-No pain                 Barnet Glasgow

## 2020-08-22 NOTE — Progress Notes (Signed)
Discharge instructions given with stated understanding.  Patient waiting for transportation home at this time 

## 2020-08-22 NOTE — Progress Notes (Signed)
Patient returns from endo at this time

## 2020-08-22 NOTE — Discharge Summary (Signed)
Physician Discharge Summary  KHRISTIE SAK AQT:[id number] DOB: November 25, 1940 DOA: 08/19/2020  PCP: [name], MD  Admit date: 08/19/2020 Discharge date: 08/22/2020  Admitted From: Home Disposition:  Home  Discharge Condition:Stable CODE STATUS:DNR Diet recommendation: Carb Modified    Brief/Interim Summary:  Patient is a 80 year-old female with history of PE currently on Eliquis, COPD, type 2 diabetes, rheumatoid arthritis on chronic low-dose prednisone therapy who presented with hematochezia, dark stools.  Denied any abdominal pain, nausea or vomiting on presentation.  Her hemoglobin remained stable throughout the hospitalization.  She was on heparin drip for PE.  [facility] consulted, she underwent endoscopy and colonoscopy today.  Her hemoglobin has remained stable and she is not actively bleeding, she is medically stable for discharge to home today.  Following problems were addressed during her hospitalization:  Upper GI bleed: Presented with hematochezia, diarrhea stools.  No abdominal pain, nausea or vomiting.  Hemoglobin currently stable at baseline.  Started on  IV PPI.  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 Also found to have colonic  polyp-removed, diverticulosis and anal/rectal prolapse. She takes Protonix at home.   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  On Eliquis at home.  On heparin drip currently.  She can resume her home Eliquis from tomorrow.   Chronic normocytic anemia: Baseline hemoglobin around 9.  Continue to monitor as an outpatient   COPD: Not on home oxygen.  Not on exacerbation at present.  Continue bronchodilators as needed.   Insulin-dependent type 2 diabetes: Recent hemoglobin A1c of 8.1.  Continue home regimen on discharge   Hyperlipidemia: On Lipitor   History of rheumatoid arthritis/debility/deconditioning: Follow-up with rheumatology as an outpatient recommended.  Patient  is wheelchair-bound, limited mobility.  PT recommended home health  Discharge Diagnoses:  Principal Problem:   Acute lower GI bleeding Active Problems:   GERD   Rheumatoid arthritis ([place])   Diabetes mellitus due to underlying condition, controlled, with complication, without long-term current use of insulin (HCC)   Other hyperlipidemia   Acute pulmonary embolism (HCC)   GI bleed    Discharge Instructions  Discharge Instructions     Diet Carb Modified   Complete by: As directed    Discharge instructions   Complete by: As directed    1)Please follow up with your PCP in 1-2 weeks 2)You can resume eliquis from tomorrow   Increase activity slowly   Complete by: As directed    No wound care   Complete by: As directed       Allergies as of 08/22/2020       Reactions   Penicillins Hives   Has patient had a PCN reaction causing immediate rash, facial/tongue/throat swelling, SOB or lightheadedness with hypotension: Yes Has patient had a PCN reaction causing severe rash involving mucus membranes or skin necrosis: No Has patient had a PCN reaction that required hospitalization: No Has patient had a PCN reaction occurring within the last 10 years: No If all of the above answers are "NO", then may proceed with Cephalosporin use. Patient has tolerated ceftriaxone in the recent past.   Sulfamethoxazole-trimethoprim Nausea And Vomiting, Other (See Comments)   Stomach problems   Meperidine Other (See Comments)   Cramping   Meperidine Hcl Other (See Comments)   Cramping   Penicillin G Sodium Hives   Sulfa Antibiotics Nausea And Vomiting, Other (See Comments)   Stomach problems/GI upset   Sulfonamide Derivatives Nausea And Vomiting, Other (  See Comments)   "Stomach problems"        Medication List     TAKE these medications    acetaminophen 500 MG tablet Commonly known as: TYLENOL Take 500-1,000 mg by mouth every 6 (six) hours as needed (for headaches).   albuterol 108 (90  Base) MCG/ACT inhaler Commonly known as: VENTOLIN HFA Inhale 2 puffs into the lungs every 6 (six) hours as needed for shortness of breath or wheezing.   apixaban 5 MG Tabs tablet Commonly known as: ELIQUIS Take 1 tablet (5 mg total) by mouth 2 (two) times daily. Start taking only from tomorrow What changed:  additional instructions Another medication with the same name was removed. Continue taking this medication, and follow the directions you see here.   arformoterol 15 MCG/2ML Nebu Commonly known as: BROVANA Take 15 mcg by nebulization daily. What changed: Another medication with the same name was removed. Continue taking this medication, and follow the directions you see here.   atorvastatin 20 MG tablet Commonly known as: LIPITOR Take 20 mg by mouth at bedtime.   B-12 2500 MCG Tabs Take 2,500 mcg by mouth at bedtime.   B-D UF III MINI PEN NEEDLES 31G X 5 MM Misc Generic drug: Insulin Pen Needle SMARTSIG:1 Each SUB-Q 4 Times Daily   budesonide 0.25 MG/2ML nebulizer solution Commonly known as: PULMICORT Take 0.25 mg by nebulization 2 (two) times daily as needed (sob/wheezing).   budesonide 0.25 MG/2ML nebulizer solution Commonly known as: PULMICORT Take 2 mLs (0.25 mg total) by nebulization 2 (two) times daily.   Compressor/Nebulizer [employer] for asthma/emphysema   DULoxetine 60 MG capsule Commonly known as: CYMBALTA Take 60 mg by mouth at bedtime.   ezetimibe 10 MG tablet Commonly known as: ZETIA Take 10 mg by mouth at bedtime.   insulin NPH-regular Human (70-30) 100 UNIT/ML injection Inject 14 Units into the skin 2 (two) times daily with a meal.   loperamide 2 MG tablet Commonly known as: IMODIUM A-D Take 2 mg by mouth 4 (four) times daily as needed for diarrhea or loose stools.   metFORMIN 500 MG tablet Commonly known as: GLUCOPHAGE Take 1,000 mg by mouth at bedtime.   OneTouch Verio test strip Generic drug: glucose blood    oxyCODONE 5 MG immediate release tablet Commonly known as: Oxy IR/ROXICODONE Take 1 tablet (5 mg total) by mouth every 6 (six) hours as needed for severe pain or moderate pain.   pantoprazole 40 MG tablet Commonly known as: Protonix Take one tablet once every morning What changed:  how much to take how to take this when to take this additional instructions   predniSONE 5 MG tablet Commonly known as: DELTASONE Take 5 mg by mouth at bedtime.   predniSONE 20 MG tablet Commonly known as: DELTASONE Take 20 mg by mouth daily as needed (pain).   tolterodine 2 MG 24 hr capsule Commonly known as: DETROL LA Take 2 mg by mouth daily.   Vitamin D3 50 MCG (2000 UT) Tabs Take 2,000 Units by mouth at bedtime.       ASK your doctor about these medications    ipratropium-albuterol 0.5-2.5 (3) MG/3ML Soln Commonly known as: DUONEB Take 3 mLs by nebulization every 6 (six) hours as needed.        Follow-up Information     [facility].   Contact information: [place] [address] [phone]         [name], MD.  Schedule an appointment as soon as possible for a visit in 1 week(s).   Specialty: Internal Medicine Contact information: [place] [address] [phone]                Allergies  Allergen Reactions   Penicillins Hives    Has patient had a PCN reaction causing immediate rash, facial/tongue/throat swelling, SOB or lightheadedness with hypotension: Yes Has patient had a PCN reaction causing severe rash involving mucus membranes or skin necrosis: No Has patient had a PCN reaction that required hospitalization: No Has patient had a PCN reaction occurring within the last 10 years: No If all of the above answers are "NO", then may proceed with Cephalosporin use. Patient has tolerated ceftriaxone in the recent past.   Sulfamethoxazole-Trimethoprim Nausea And Vomiting and Other (See  Comments)    Stomach problems   Meperidine Other (See Comments)    Cramping   Meperidine Hcl Other (See Comments)    Cramping    Penicillin G Sodium Hives   Sulfa Antibiotics Nausea And Vomiting and Other (See Comments)    Stomach problems/GI upset   Sulfonamide Derivatives Nausea And Vomiting and Other (See Comments)    "Stomach problems"    Consultations: GI   Procedures/Studies: CT HEAD WO CONTRAST  Result Date: 07/24/2020 CLINICAL DATA:  [place] EXAM: CT HEAD WITHOUT CONTRAST TECHNIQUE: Contiguous axial images were obtained from the base of the skull through the vertex without intravenous contrast. COMPARISON:  12/28/2004 FINDINGS: Brain: There is no mass, hemorrhage or extra-axial collection. There is generalized atrophy without lobar predilection. Hypodensity of the white matter is most commonly associated with chronic microvascular disease. Vascular: No abnormal hyperdensity of the major intracranial arteries or dural venous sinuses. No intracranial atherosclerosis. Skull: The visualized skull base, calvarium and extracranial soft tissues are normal. Sinuses/Orbits: No fluid levels or advanced mucosal thickening of the visualized paranasal sinuses. No mastoid or middle ear effusion. The orbits are normal. IMPRESSION: Generalized atrophy and chronic microvascular ischemia without acute intracranial abnormality. Electronically Signed   By: Ulyses Jarred M.D.   On: 07/24/2020 21:52   US RENAL  Result Date: 07/25/2020 CLINICAL DATA:  Acute renal insufficiency EXAM: RENAL / URINARY TRACT ULTRASOUND COMPLETE COMPARISON:  None. FINDINGS: Right Kidney: Renal measurements: 9.1 x 4.8 x 4.1 cm = volume: 95 mL. Echogenicity within normal limits. No mass or hydronephrosis visualized. 11 mm simple cortical cyst is seen arising exophytically from the upper pole. Left Kidney: Renal measurements: 9.9 x 4.7 x 4.1 cm = volume: 98 mL. Echogenicity within normal limits. No mass or hydronephrosis  visualized. 8 mm simple cortical cyst is seen arising exophytically from the interpolar region of the left kidney. Bladder: Appears normal for degree of bladder distention. Bilateral ureteral jets are identified. Other: None. IMPRESSION: Normal renal sonogram. Electronically Signed   By: Fidela Salisbury MD   On: 07/25/2020 04:36      Subjective: Patient seen and examined at the bedside this morning.  She is medically stable for discharge today after EGD and colonoscopy.  I called her son and  discussed about discharge planning  Discharge Exam: Vitals:   08/22/20 1520 08/22/20 1530  BP: (!) 118/47 (!) 126/54  Pulse: 84 82  Resp: (!) 27 (!) 21  Temp:    SpO2: 96% 92%   Vitals:   08/22/20 1508 08/22/20 1510 08/22/20 1520 08/22/20 1530  BP: (!) 96/46 (!) 111/40 (!) 118/47 (!) 126/54  Pulse: 86 85 84 82  Resp: (!) 22 (!)  29 (!) 27 (!) 21  Temp: 97.7 F (36.5 C)     TempSrc: Oral     SpO2: 98% 94% 96% 92%  Weight:        General: Pt is alert, awake, not in acute distress Cardiovascular: RRR, S1/S2 +, no rubs, no gallops Respiratory: CTA bilaterally, no wheezing, no rhonchi Abdominal: Soft, NT, ND, bowel sounds + Extremities: no edema, no cyanosis    The results of significant diagnostics from this hospitalization (including imaging, microbiology, ancillary and laboratory) are listed below for reference.     Microbiology: Recent Results (from the past 240 hour(s))  Resp Panel by RT-PCR (Flu A&B, Covid) Nasopharyngeal Swab     Status: None   Collection Time: 08/19/20 10:24 PM   Specimen: Nasopharyngeal Swab; Nasopharyngeal(NP) swabs in vial transport medium  Result Value Ref Range Status   SARS Coronavirus 2 by RT PCR NEGATIVE NEGATIVE Final    Comment: (NOTE) SARS-CoV-2 target nucleic acids are NOT DETECTED.  The SARS-CoV-2 RNA is generally detectable in upper respiratory specimens during the acute phase of infection. The lowest concentration of SARS-CoV-2 viral copies this  assay can detect is 138 copies/mL. A negative result does not preclude SARS-Cov-2 infection and should not be used as the sole basis for treatment or other patient management decisions. A negative result may occur with  improper specimen collection/handling, submission of specimen other than nasopharyngeal swab, presence of viral mutation(s) within the areas targeted by this assay, and inadequate number of viral copies(<138 copies/mL). A negative result must be combined with clinical observations, patient history, and epidemiological information. The expected result is Negative.  Fact Sheet for Patients:  EntrepreneurPulse.com.au  Fact Sheet for Healthcare Providers:  IncredibleEmployment.be  This test is no t yet approved or cleared by the [place] FDA and  has been authorized for detection and/or diagnosis of SARS-CoV-2 by FDA under an Emergency Use Authorization (EUA). This EUA will remain  in effect (meaning this test can be used) for the duration of the COVID-19 declaration under Section 564(b)(1) of the Act, 21 U.S.C.section 360bbb-3(b)(1), unless the authorization is terminated  or revoked sooner.       Influenza A by PCR NEGATIVE NEGATIVE Final   Influenza B by PCR NEGATIVE NEGATIVE Final    Comment: (NOTE) The Xpert Xpress SARS-CoV-2/FLU/RSV plus assay is intended as an aid in the diagnosis of influenza from Nasopharyngeal swab specimens and should not be used as a sole basis for treatment. Nasal washings and aspirates are unacceptable for Xpert Xpress SARS-CoV-2/FLU/RSV testing.  Fact Sheet for Patients: EntrepreneurPulse.com.au  Fact Sheet for Healthcare Providers: IncredibleEmployment.be  This test is not yet approved or cleared by the [place] FDA and has been authorized for detection and/or diagnosis of SARS-CoV-2 by FDA under an Emergency Use Authorization (EUA). This EUA will  remain in effect (meaning this test can be used) for the duration of the COVID-19 declaration under Section 564(b)(1) of the Act, 21 U.S.C. section 360bbb-3(b)(1), unless the authorization is terminated or revoked.  Performed at [facility], [place] [address], [place], [place] [address]      Labs: BNP (last 3 results) Recent Labs    05/22/20 1800 07/21/20 2341  BNP 74.1 28.4   Basic Metabolic Panel: Recent Labs  Lab 08/19/20 2248 08/20/20 0503 08/21/20 0450  NA 138 140 139  K 3.6 4.0 4.5  CL 106 107 107  CO2 24 25 25   GLUCOSE 154* 202* 182*  BUN 7* 6* 11  CREATININE  0.71 0.67 0.78  CALCIUM 7.8* 8.0* 8.4*  MG 1.5* 1.5* 2.0  PHOS  --  3.2  --    Liver Function Tests: Recent Labs  Lab 08/19/20 2248 08/20/20 0503  AST 11* 9*  ALT 10 11  ALKPHOS 65 62  BILITOT 0.7 0.6  PROT 5.7* 5.7*  ALBUMIN 2.6* 2.5*   No results for input(s): LIPASE, AMYLASE in the last 168 hours. No results for input(s): AMMONIA in the last 168 hours. CBC: Recent Labs  Lab 08/19/20 2248 08/20/20 0503 08/20/20 0914 08/21/20 0450 08/22/20 0517  WBC 9.2 9.0  --  9.5 8.5  HGB 9.0* 9.3* 9.2* 9.1* 9.5*  HCT 30.5* 31.7* 31.0* 31.5* 32.4*  MCV 83.1 82.3  --  83.3 83.5  PLT 324 334  --  379 403*   Cardiac Enzymes: No results for input(s): CKTOTAL, CKMB, CKMBINDEX, TROPONINI in the last 168 hours. BNP: Invalid input(s): POCBNP CBG: Recent Labs  Lab 08/21/20 1158 08/21/20 1809 08/21/20 2349 08/22/20 0550 08/22/20 1114  GLUCAP 123* 110* 121* 156* 124*   D-Dimer No results for input(s): DDIMER in the last 72 hours. Hgb A1c No results for input(s): HGBA1C in the last 72 hours. Lipid Profile No results for input(s): CHOL, HDL, LDLCALC, TRIG, CHOLHDL, LDLDIRECT in the last 72 hours. Thyroid function studies No results for input(s): TSH, T4TOTAL, T3FREE, THYROIDAB in the last 72 hours.  Invalid input(s): FREET3 Anemia work up No results for input(s): VITAMINB12,  FOLATE, FERRITIN, TIBC, IRON, RETICCTPCT in the last 72 hours. Urinalysis    Component Value Date/Time   COLORURINE AMBER (A) 07/24/2020 1102   APPEARANCEUR CLOUDY (A) 07/24/2020 1102   LABSPEC 1.030 07/24/2020 1102   PHURINE 5.0 07/24/2020 1102   GLUCOSEU NEGATIVE 07/24/2020 1102   GLUCOSEU NEGATIVE 12/24/2016 1239   HGBUR NEGATIVE 07/24/2020 1102   BILIRUBINUR NEGATIVE 07/24/2020 1102   KETONESUR 5 (A) 07/24/2020 1102   PROTEINUR 30 (A) 07/24/2020 1102   UROBILINOGEN 0.2 12/24/2016 1239   NITRITE NEGATIVE 07/24/2020 1102   LEUKOCYTESUR NEGATIVE 07/24/2020 1102   Sepsis Labs Invalid input(s): PROCALCITONIN,  WBC,  LACTICIDVEN Microbiology Recent Results (from the past 240 hour(s))  Resp Panel by RT-PCR (Flu A&B, Covid) Nasopharyngeal Swab     Status: None   Collection Time: 08/19/20 10:24 PM   Specimen: Nasopharyngeal Swab; Nasopharyngeal(NP) swabs in vial transport medium  Result Value Ref Range Status   SARS Coronavirus 2 by RT PCR NEGATIVE NEGATIVE Final    Comment: (NOTE) SARS-CoV-2 target nucleic acids are NOT DETECTED.  The SARS-CoV-2 RNA is generally detectable in upper respiratory specimens during the acute phase of infection. The lowest concentration of SARS-CoV-2 viral copies this assay can detect is 138 copies/mL. A negative result does not preclude SARS-Cov-2 infection and should not be used as the sole basis for treatment or other patient management decisions. A negative result may occur with  improper specimen collection/handling, submission of specimen other than nasopharyngeal swab, presence of viral mutation(s) within the areas targeted by this assay, and inadequate number of viral copies(<138 copies/mL). A negative result must be combined with clinical observations, patient history, and epidemiological information. The expected result is Negative.  Fact Sheet for Patients:  EntrepreneurPulse.com.au  Fact Sheet for Healthcare Providers:   IncredibleEmployment.be  This test is no t yet approved or cleared by the [place] FDA and  has been authorized for detection and/or diagnosis of SARS-CoV-2 by FDA under an Emergency Use Authorization (EUA). This EUA will remain  in effect (meaning  this test can be used) for the duration of the COVID-19 declaration under Section 564(b)(1) of the Act, 21 U.S.C.section 360bbb-3(b)(1), unless the authorization is terminated  or revoked sooner.       Influenza A by PCR NEGATIVE NEGATIVE Final   Influenza B by PCR NEGATIVE NEGATIVE Final    Comment: (NOTE) The Xpert Xpress SARS-CoV-2/FLU/RSV plus assay is intended as an aid in the diagnosis of influenza from Nasopharyngeal swab specimens and should not be used as a sole basis for treatment. Nasal washings and aspirates are unacceptable for Xpert Xpress SARS-CoV-2/FLU/RSV testing.  Fact Sheet for Patients: EntrepreneurPulse.com.au  Fact Sheet for Healthcare Providers: IncredibleEmployment.be  This test is not yet approved or cleared by the [place] FDA and has been authorized for detection and/or diagnosis of SARS-CoV-2 by FDA under an Emergency Use Authorization (EUA). This EUA will remain in effect (meaning this test can be used) for the duration of the COVID-19 declaration under Section 564(b)(1) of the Act, 21 U.S.C. section 360bbb-3(b)(1), unless the authorization is terminated or revoked.  Performed at [facility], [place] [address] [place], [place] [address]     Please note: You were cared for by a hospitalist during your hospital stay. Once you are discharged, your primary care physician will handle any further medical issues. Please note that NO REFILLS for any discharge medications will be authorized once you are discharged, as it is imperative that you return to your primary care physician (or establish a relationship with a primary care  physician if you do not have one) for your post hospital discharge needs so that they can reassess your need for medications and monitor your lab values.    Time coordinating discharge: 40 minutes  SIGNED:   Shelly Coss, MD  [facility] 08/22/2020, 4:21 PM Pager [phone]  If 7PM-7AM, please contact night-coverage [email or web address] Password [email or web address]

## 2020-08-22 NOTE — Interval H&P Note (Signed)
History and Physical Interval Note: 80/female with hematochezia, was on eliquis,last colonoscopy  in 2012 for an EGD and colonoscopy today.  08/22/2020 1:18 PM  [name]  has presented today for EGD and colonoscopy, with the diagnosis of Anemia, hematochezia.  The various methods of treatment have been discussed with the patient and family. After consideration of risks, benefits and other options for treatment, the patient has consented to  Procedure(s) with comments: COLONOSCOPY WITH PROPOFOL (N/A) - If schedule availability permits, would like to add on upper endoscopy as well ESOPHAGOGASTRODUODENOSCOPY (EGD) WITH PROPOFOL (N/A) as a surgical intervention.  The patient's history has been reviewed, patient examined, no change in status, stable for surgery.  I have reviewed the patient's chart and labs.  Questions were answered to the patient's satisfaction.     Ronnette Juniper

## 2020-08-22 NOTE — Progress Notes (Signed)
[place] for Heparin Indication: pulmonary embolus  Allergies  Allergen Reactions   Penicillins Hives    Has patient had a PCN reaction causing immediate rash, facial/tongue/throat swelling, SOB or lightheadedness with hypotension: Yes Has patient had a PCN reaction causing severe rash involving mucus membranes or skin necrosis: No Has patient had a PCN reaction that required hospitalization: No Has patient had a PCN reaction occurring within the last 10 years: No If all of the above answers are "NO", then may proceed with Cephalosporin use. Patient has tolerated ceftriaxone in the recent past.   Sulfamethoxazole-Trimethoprim Nausea And Vomiting and Other (See Comments)    Stomach problems   Meperidine Other (See Comments)    Cramping   Meperidine Hcl Other (See Comments)    Cramping    Penicillin G Sodium Hives   Sulfa Antibiotics Nausea And Vomiting and Other (See Comments)    Stomach problems/GI upset   Sulfonamide Derivatives Nausea And Vomiting and Other (See Comments)    "Stomach problems"    Patient Measurements: Weight: 82.9 kg (182 lb 12.2 oz) Heparin Dosing Weight: 76 kg  Vital Signs: Temp: 98 F (36.7 C) (06/14 0504) BP: 135/71 (06/14 0504) Pulse Rate: 89 (06/14 0504)  Labs: Recent Labs    08/19/20 2248 08/20/20 0503 08/20/20 0914 08/20/20 1510 08/20/20 2324 08/21/20 0450 08/22/20 0517  HGB 9.0* 9.3* 9.2*  --   --  9.1* 9.5*  HCT 30.5* 31.7* 31.0*  --   --  31.5* 32.4*  PLT 324 334  --   --   --  379 403*  APTT  --  44*  --   --   --   --   --   LABPROT 13.8 13.6  --   --   --   --   --   INR 1.1 1.0  --   --   --   --   --   HEPARINUNFRC  --  0.15*  --    < > 0.32 0.30 0.16*  CREATININE 0.71 0.67  --   --   --  0.78  --    < > = values in this interval not displayed.     Estimated Creatinine Clearance: 61.8 mL/min (by C-G formula based on SCr of 0.78 mg/dL).   Medications:  PTA Apixaban (pt reports last  dose taken 6/9 AM) IV heparin started at [facility] on 6/10  Assessment: 80 yr female transferred from [facility] (arrived at [facility] on 6/9) PMH significant for bilateral PE (dx 07/2020) on Apixaban, COPD, DM, RA, chronic anemia Admitted with lower GI bleed Pharmacy consulted for IV heparin dosing on admission. No bolus per MD.   08/22/20 Heparin level = 0.16 subtherapeutic on 1150 units/hr Hgb = 9.5 (low but stable), Pltc WNL Per RN no active bleeding overnight or line interruptions   Goal of Therapy:  Heparin level 0.3-0.5 units/ml (target lower end of therapeutic range per Dr. Erlinda Hong) Monitor platelets by anticoagulation protocol: Yes   Plan:  Increase heparin infusion to 1300 units/hr Hold heparin today at 0830 for procedure F/u when to resume   Dolly Rias RPh 08/22/2020, 6:08 AM

## 2020-08-22 NOTE — Progress Notes (Signed)
Patient to endo at this time.

## 2020-08-22 NOTE — Op Note (Signed)
[facility] Patient Name: [name] Procedure Date: 08/22/2020 MRN: [id number] Attending MD: Ronnette Juniper , MD Date of Birth: 13-Dec-1940 CSN: [id number] Age: 80 Admit Type: Inpatient Procedure:                Upper GI endoscopy Indications:              Unexplained iron deficiency anemia, Hematochezia Providers:                Ronnette Juniper, MD, Dulcy Fanny, Cletis Athens,                            Technician, Nicholes Calamity Referring MD:             [facility] Medicines:                Monitored Anesthesia Care Complications:            No immediate complications. Estimated blood loss:                            Minimal. Estimated Blood Loss:     Estimated blood loss was minimal. Procedure:                Pre-Anesthesia Assessment:                           - Prior to the procedure, a History and Physical                            was performed, and patient medications and                            allergies were reviewed. The patient's tolerance of                            previous anesthesia was also reviewed. The risks                            and benefits of the procedure and the sedation                            options and risks were discussed with the patient.                            All questions were answered, and informed consent                            was obtained. Prior Anticoagulants: The patient has                            taken Eliquis (apixaban), last dose was 5 days                            prior to procedure. ASA Grade Assessment: III - A  patient with severe systemic disease. After                            reviewing the risks and benefits, the patient was                            deemed in satisfactory condition to undergo the                            procedure.                           After obtaining informed consent, the endoscope was                            passed under direct  vision. Throughout the                            procedure, the patient's blood pressure, pulse, and                            oxygen saturations were monitored continuously. The                            [id number]-[id number] ([id number] Olympus gastroscope was                            introduced through the mouth, and advanced to the                            third part of duodenum. The upper GI endoscopy was                            accomplished without difficulty. The patient                            tolerated the procedure well. Scope In: Scope Out: Findings:      The upper third of the esophagus, middle third of the esophagus and       lower third of the esophagus were normal.      A widely patent Schatzki ring was found at the gastroesophageal junction.      Three 6 mm sessile erythematous polyps, likely hyperplastic vs       inflammatory, with no bleeding and no stigmata of recent bleeding were       found in the cardia, immediately below the GE junction. Biopsies were       taken with a cold forceps for histology.      A 3 cm hiatal hernia was present.      Localized mildly erythematous mucosa without bleeding was found in the       gastric antrum.      The cardia and gastric fundus were normal on retroflexion.      A single small angioectasia with typical arborization was found in the       third portion of the duodenum. Coagulation for tissue destruction using  argon plasma at 1 liter/minute and 15 watts was successful. Impression:               - Normal upper third of esophagus, middle third of                            esophagus and lower third of esophagus.                           - Widely patent Schatzki ring.                           - Three gastric polyps. Biopsied.                           - 3 cm hiatal hernia.                           - Erythematous mucosa in the antrum.                           - A single angioectasia in the duodenum. Treated                             with argon plasma coagulation (APC). Moderate Sedation:      Patient did not receive moderate sedation for this procedure, but       instead received monitored anesthesia care. Recommendation:           - Perform a colonoscopy today. Procedure Code(s):        --- Professional ---                           [phone], Esophagogastroduodenoscopy, flexible,                            transoral; with ablation of tumor(s), polyp(s), or                            other lesion(s) (includes pre- and post-dilation                            and guide wire passage, when performed)                           43239, 13, Esophagogastroduodenoscopy, flexible,                            transoral; with biopsy, single or multiple Diagnosis Code(s):        --- Professional ---                           K22.2, Esophageal obstruction                           K31.7, Polyp of stomach and duodenum  K44.9, Diaphragmatic hernia without obstruction or                            gangrene                           K31.89, Other diseases of stomach and duodenum                           K31.819, Angiodysplasia of stomach and duodenum                            without bleeding                           D50.9, Iron deficiency anemia, unspecified                           K92.1, Melena (includes Hematochezia) CPT copyright 2019 American Medical Association. All rights reserved. The codes documented in this report are preliminary and upon coder review may  be revised to meet current compliance requirements. Ronnette Juniper, MD 08/22/2020 2:54:11 PM This report has been signed electronically. Number of Addenda: 0

## 2020-08-22 NOTE — Op Note (Signed)
[facility] Patient Name: [name] Procedure Date: 08/22/2020 MRN: [id number] Attending MD: Ronnette Juniper , MD Date of Birth: [birth year] CSN: [id number] Age: 80 Admit Type: Inpatient Procedure:                Colonoscopy Indications:              Hematochezia, Unexplained iron deficiency anemia,                            last colonoscopy was in 2012 Providers:                Ronnette Juniper, MD, Dulcy Fanny, Cletis Athens,                            Technician, Nicholes Calamity Referring MD:             [facility] Medicines:                Monitored Anesthesia Care Complications:            No immediate complications. Estimated blood loss:                            None. Estimated Blood Loss:     Estimated blood loss was minimal. Procedure:                Pre-Anesthesia Assessment:                           - Prior to the procedure, a History and Physical                            was performed, and patient medications and                            allergies were reviewed. The patient's tolerance of                            previous anesthesia was also reviewed. The risks                            and benefits of the procedure and the sedation                            options and risks were discussed with the patient.                            All questions were answered, and informed consent                            was obtained. Prior Anticoagulants: The patient has                            taken Eliquis (apixaban), last dose was 5 days                            prior to  procedure. ASA Grade Assessment: III - A                            patient with severe systemic disease. After                            reviewing the risks and benefits, the patient was                            deemed in satisfactory condition to undergo the                            procedure.                           - Prior to the procedure, a History and Physical                             was performed, and patient medications and                            allergies were reviewed. The patient's tolerance of                            previous anesthesia was also reviewed. The risks                            and benefits of the procedure and the sedation                            options and risks were discussed with the patient.                            All questions were answered, and informed consent                            was obtained. Prior Anticoagulants: The patient has                            taken Eliquis (apixaban), last dose was 5 days                            prior to procedure. ASA Grade Assessment: III - A                            patient with severe systemic disease. After                            reviewing the risks and benefits, the patient was                            deemed in satisfactory condition to undergo the  procedure.                           After obtaining informed consent, the colonoscope                            was passed under direct vision. Throughout the                            procedure, the patient's blood pressure, pulse, and                            oxygen saturations were monitored continuously. The                            [id number]-H190DL ([id number] Olympus pediatric colonscope                            was introduced through the anus and advanced to the                            the terminal ileum. The colonoscopy was performed                            without difficulty. The patient tolerated the                            procedure well. The quality of the bowel                            preparation was good. Scope In: 2:27:54 PM Scope Out: 2:45:57 PM Scope Withdrawal Time: 0 hours 13 minutes 14 seconds  Total Procedure Duration: 0 hours 18 minutes 3 seconds  Findings:      The perianal exam findings include anal/rectal prolapse.      A 5 mm polyp was  found in the descending colon. The polyp was sessile.       The polyp was removed with a piecemeal technique using a cold biopsy       forceps. Resection and retrieval were complete.      Multiple small and large-mouthed diverticula were found in the sigmoid       colon and descending colon.      Retroflexion was attempted but was not successful due to presence of       rectal/anal prolapse. Impression:               - Anal/rectal prolapse. found on perianal exam.                           - One 5 mm polyp in the descending colon, removed                            piecemeal using a cold biopsy forceps. Resected and                            retrieved.                           -  Diverticulosis in the sigmoid colon and in the                            descending colon. Moderate Sedation:      Patient did not receive moderate sedation for this procedure, but       instead received monitored anesthesia care. Recommendation:           - High fiber diet.                           - Continue present medications.                           - Await pathology results.                           - Repeat colonoscopy for surveillance based on                            pathology results.                           - Resume Eliquis (apixaban) at prior dose tomorrow. Procedure Code(s):        --- Professional ---                           [phone], Colonoscopy, flexible; with biopsy, single                            or multiple Diagnosis Code(s):        --- Professional ---                           K63.5, Polyp of colon                           K92.1, Melena (includes Hematochezia)                           D50.9, Iron deficiency anemia, unspecified                           K57.30, Diverticulosis of large intestine without                            perforation or abscess without bleeding CPT copyright 2019 American Medical Association. All rights reserved. The codes documented in this report are  preliminary and upon coder review may  be revised to meet current compliance requirements. Ronnette Juniper, MD 08/22/2020 2:57:42 PM This report has been signed electronically. Number of Addenda: 0

## 2020-08-22 NOTE — Progress Notes (Signed)
Patient drank 2/3rd of the bowel prep. She had several watery stools, clear yellow in color. She received Preparation H ointment for complains of pain around the rectum (hemorrhoids). Maintained NPO diet after midnight.

## 2020-08-22 NOTE — Transfer of Care (Signed)
Immediate Anesthesia Transfer of Care Note  Patient: [name]  Procedure(s) Performed: COLONOSCOPY WITH PROPOFOL ESOPHAGOGASTRODUODENOSCOPY (EGD) WITH PROPOFOL POLYPECTOMY HOT HEMOSTASIS (ARGON PLASMA COAGULATION/BICAP)  Patient Location: PACU  Anesthesia Type:MAC  Level of Consciousness: awake and patient cooperative  Airway & Oxygen Therapy: Patient Spontanous Breathing  Post-op Assessment: Report given to RN and Post -op Vital signs reviewed and stable  Post vital signs: Reviewed and stable  Last Vitals:  Vitals Value Taken Time  BP    Temp    Pulse    Resp 27 08/22/20 1457  SpO2    Vitals shown include unvalidated device data.  Last Pain:  Vitals:   08/22/20 1317  TempSrc: Temporal  PainSc: 3          Complications: No notable events documented.

## 2020-08-22 NOTE — Plan of Care (Signed)

## 2020-08-22 NOTE — Progress Notes (Signed)
PROGRESS NOTE    [name]  WCH:[id number] DOB: September 20, 1940 DOA: 08/19/2020 PCP: [name], MD   Chief Complain: Hematochezia/dark stool  Brief Narrative: Patient is a 80 year-old female with history of PE currently on Eliquis, COPD, type 2 diabetes, rheumatoid arthritis on chronic low-dose prednisone therapy who presented with hematochezia, dark stools.  Denies any abdominal pain, nausea or vomiting on presentation.  Hemoglobin is currently stable, no active bleeding at present.  Currently on IV PPI.  Also on heparin drip for PE.  [facility] consulted, plan for EGD/colonoscopy today  Assessment & Plan:   Principal Problem:   Acute lower GI bleeding Active Problems:   GERD   Rheumatoid arthritis ([place])   Diabetes mellitus due to underlying condition, controlled, with complication, without long-term current use of insulin ([place])   Other hyperlipidemia   Acute pulmonary embolism ([facility])   GI bleed   Acute GI bleed: Presented with hematochezia, diarrhea stools.  No abdominal pain, nausea or vomiting.  Hemoglobin currently stable at baseline.  On IV PPI.  Plan for EGD/colonoscopy today.  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  On Eliquis at home.  On heparin drip currently  Chronic normocytic anemia: Baseline hemoglobin around 9.  Continue to monitor.  COPD: Not on home oxygen.  Not on exacerbation at present.  Continue bronchodilators as needed.  Insulin-dependent type 2 diabetes: Recent hemoglobin A1c of 8.1.  Currently on sliding scale insulin.  Hyperlipidemia: On Lipitor  History of rheumatoid arthritis/debility/deconditioning: Follow-up with rheumatology as an outpatient recommended.  Patient is wheelchair-bound, limited mobility.  PT recommended home health         DVT prophylaxis:Heparin IV Code Status: DNR Family Communication: None at bedside Status is: Inpatient  Remains inpatient appropriate because:IV treatments appropriate due to intensity of  illness or inability to take PO  Dispo: The patient is from: Home              Anticipated d/c is to: Home              Patient currently is not medically stable to d/c.   Difficult to place patient No     Consultants: GI  Procedures:None  Antimicrobials:  Anti-infectives (From admission, onward)    None       Subjective:  Patient seen and examined at bedside this morning.  Hemodynamically stable.  She had several bowel movements last night which were nonbloody.  Comfortable.  Waiting for EGD/colonoscopy  Objective: Vitals:   08/21/20 1348 08/21/20 2120 08/22/20 0500 08/22/20 0504  BP: 136/68 116/61  135/71  Pulse: 83 92  89  Resp: 16 18  18   Temp: 97.9 F (36.6 C) 98.3 F (36.8 C)  98 F (36.7 C)  TempSrc: Oral     SpO2: 97%   95%  Weight:   82.9 kg     Intake/Output Summary (Last 24 hours) at 08/22/2020 1228 Last data filed at 08/21/2020 1821 Gross per 24 hour  Intake 360 ml  Output --  Net 360 ml   Filed Weights   08/20/20 0000 08/21/20 0500 08/22/20 0500  Weight: 83 kg 85.6 kg 82.9 kg    Examination:  General exam: Overall comfortable, not in distress, pleasant elderly female HEENT: PERRL Respiratory system:  no wheezes or crackles  Cardiovascular system: S1 & S2 heard, RRR.  Gastrointestinal system: Abdomen is nondistended, soft and nontender. Central nervous system: Alert and oriented Extremities: No edema, no clubbing ,no cyanosis, deformities in the hands  Skin: No rashes, no ulcers,no icterus     Data Reviewed: I have personally reviewed following labs and imaging studies  CBC: Recent Labs  Lab 08/19/20 2248 08/20/20 0503 08/20/20 0914 08/21/20 0450 08/22/20 0517  WBC 9.2 9.0  --  9.5 8.5  HGB 9.0* 9.3* 9.2* 9.1* 9.5*  HCT 30.5* 31.7* 31.0* 31.5* 32.4*  MCV 83.1 82.3  --  83.3 83.5  PLT 324 334  --  379 229*   Basic Metabolic Panel: Recent Labs  Lab 08/19/20 2248 08/20/20 0503 08/21/20 0450  NA 138 140 139  K 3.6 4.0 4.5   CL 106 107 107  CO2 24 25 25   GLUCOSE 154* 202* 182*  BUN 7* 6* 11  CREATININE 0.71 0.67 0.78  CALCIUM 7.8* 8.0* 8.4*  MG 1.5* 1.5* 2.0  PHOS  --  3.2  --    GFR: Estimated Creatinine Clearance: 61.8 mL/min (by C-G formula based on SCr of 0.78 mg/dL). Liver Function Tests: Recent Labs  Lab 08/19/20 2248 08/20/20 0503  AST 11* 9*  ALT 10 11  ALKPHOS 65 62  BILITOT 0.7 0.6  PROT 5.7* 5.7*  ALBUMIN 2.6* 2.5*   No results for input(s): LIPASE, AMYLASE in the last 168 hours. No results for input(s): AMMONIA in the last 168 hours. Coagulation Profile: Recent Labs  Lab 08/19/20 2248 08/20/20 0503  INR 1.1 1.0   Cardiac Enzymes: No results for input(s): CKTOTAL, CKMB, CKMBINDEX, TROPONINI in the last 168 hours. BNP (last 3 results) No results for input(s): PROBNP in the last 8760 hours. HbA1C: No results for input(s): HGBA1C in the last 72 hours. CBG: Recent Labs  Lab 08/21/20 1158 08/21/20 1809 08/21/20 2349 08/22/20 0550 08/22/20 1114  GLUCAP 123* 110* 121* 156* 124*   Lipid Profile: No results for input(s): CHOL, HDL, LDLCALC, TRIG, CHOLHDL, LDLDIRECT in the last 72 hours. Thyroid Function Tests: No results for input(s): TSH, T4TOTAL, FREET4, T3FREE, THYROIDAB in the last 72 hours. Anemia Panel: No results for input(s): VITAMINB12, FOLATE, FERRITIN, TIBC, IRON, RETICCTPCT in the last 72 hours. Sepsis Labs: No results for input(s): PROCALCITON, LATICACIDVEN in the last 168 hours.  Recent Results (from the past 240 hour(s))  Resp Panel by RT-PCR (Flu A&B, Covid) Nasopharyngeal Swab     Status: None   Collection Time: 08/19/20 10:24 PM   Specimen: Nasopharyngeal Swab; Nasopharyngeal(NP) swabs in vial transport medium  Result Value Ref Range Status   SARS Coronavirus 2 by RT PCR NEGATIVE NEGATIVE Final    Comment: (NOTE) SARS-CoV-2 target nucleic acids are NOT DETECTED.  The SARS-CoV-2 RNA is generally detectable in upper respiratory specimens during the  acute phase of infection. The lowest concentration of SARS-CoV-2 viral copies this assay can detect is 138 copies/mL. A negative result does not preclude SARS-Cov-2 infection and should not be used as the sole basis for treatment or other patient management decisions. A negative result may occur with  improper specimen collection/handling, submission of specimen other than nasopharyngeal swab, presence of viral mutation(s) within the areas targeted by this assay, and inadequate number of viral copies(<138 copies/mL). A negative result must be combined with clinical observations, patient history, and epidemiological information. The expected result is Negative.  Fact Sheet for Patients:  EntrepreneurPulse.com.au  Fact Sheet for Healthcare Providers:  IncredibleEmployment.be  This test is no t yet approved or cleared by the [place] FDA and  has been authorized for detection and/or diagnosis of SARS-CoV-2 by FDA under an Emergency Use Authorization (EUA). This EUA will  remain  in effect (meaning this test can be used) for the duration of the COVID-19 declaration under Section 564(b)(1) of the Act, 21 U.S.C.section 360bbb-3(b)(1), unless the authorization is terminated  or revoked sooner.       Influenza A by PCR NEGATIVE NEGATIVE Final   Influenza B by PCR NEGATIVE NEGATIVE Final    Comment: (NOTE) The Xpert Xpress SARS-CoV-2/FLU/RSV plus assay is intended as an aid in the diagnosis of influenza from Nasopharyngeal swab specimens and should not be used as a sole basis for treatment. Nasal washings and aspirates are unacceptable for Xpert Xpress SARS-CoV-2/FLU/RSV testing.  Fact Sheet for Patients: EntrepreneurPulse.com.au  Fact Sheet for Healthcare Providers: IncredibleEmployment.be  This test is not yet approved or cleared by the [place] FDA and has been authorized for detection and/or  diagnosis of SARS-CoV-2 by FDA under an Emergency Use Authorization (EUA). This EUA will remain in effect (meaning this test can be used) for the duration of the COVID-19 declaration under Section 564(b)(1) of the Act, 21 U.S.C. section 360bbb-3(b)(1), unless the authorization is terminated or revoked.  Performed at [facility], [place] [address], [place], [place] [address]          Radiology Studies: No results found.      Scheduled Meds:  arformoterol  15 mcg Nebulization Daily   atorvastatin  20 mg Oral QHS   ezetimibe  10 mg Oral QHS   insulin aspart  0-6 Units Subcutaneous Q6H   pantoprazole (PROTONIX) IV  40 mg Intravenous Q24H   predniSONE  5 mg Oral QHS   Continuous Infusions:     LOS: 2 days    Time spent: 25 mins.More than 50% of that time was spent in counseling and/or coordination of care.      Shelly Coss, MD [facility] P6/14/2022, 12:28 PM

## 2020-08-23 ENCOUNTER — Other Ambulatory Visit: Payer: Self-pay

## 2020-08-23 DIAGNOSIS — E0829 Diabetes mellitus due to underlying condition with other diabetic kidney complication: Secondary | ICD-10-CM

## 2020-08-23 LAB — SURGICAL PATHOLOGY

## 2020-08-24 ENCOUNTER — Other Ambulatory Visit: Payer: Self-pay | Admitting: *Deleted

## 2020-08-24 ENCOUNTER — Encounter (HOSPITAL_COMMUNITY): Payer: Self-pay | Admitting: Gastroenterology

## 2020-08-24 DIAGNOSIS — M06071 Rheumatoid arthritis without rheumatoid factor, right ankle and foot: Secondary | ICD-10-CM | POA: Diagnosis not present

## 2020-08-24 DIAGNOSIS — J439 Emphysema, unspecified: Secondary | ICD-10-CM | POA: Diagnosis not present

## 2020-08-24 DIAGNOSIS — I2699 Other pulmonary embolism without acute cor pulmonale: Secondary | ICD-10-CM | POA: Diagnosis not present

## 2020-08-24 DIAGNOSIS — G894 Chronic pain syndrome: Secondary | ICD-10-CM | POA: Diagnosis not present

## 2020-08-24 DIAGNOSIS — E11618 Type 2 diabetes mellitus with other diabetic arthropathy: Secondary | ICD-10-CM | POA: Diagnosis not present

## 2020-08-24 DIAGNOSIS — M06072 Rheumatoid arthritis without rheumatoid factor, left ankle and foot: Secondary | ICD-10-CM | POA: Diagnosis not present

## 2020-08-24 NOTE — Patient Outreach (Signed)
[facility]) Care Management  08/24/2020  ELYNOR KALLENBERGER 1940-12-27 [id number]   Telephone Assessment-Unsuccessful Transition of care to be completed by provider Initial Outreach  RN attempted outreach call however unsuccessful. RN able to leave a HIPAA approved voice message requesting a call back.  Will schedule another outreach call over the next week for pending services. Will also send outreach letter accordingly.   [name], RN Care Management Coordinator [place] Office [phone]

## 2020-08-28 ENCOUNTER — Other Ambulatory Visit (HOSPITAL_COMMUNITY)
Admission: RE | Admit: 2020-08-28 | Discharge: 2020-08-28 | Disposition: A | Discharge status: Home / Self Care | Payer: Medicare Other | Source: Ambulatory Visit | Attending: Pulmonary Disease | Admitting: Pulmonary Disease

## 2020-08-28 DIAGNOSIS — Z01812 Encounter for preprocedural laboratory examination: Secondary | ICD-10-CM | POA: Diagnosis not present

## 2020-08-28 DIAGNOSIS — Z20822 Contact with and (suspected) exposure to covid-19: Secondary | ICD-10-CM | POA: Insufficient documentation

## 2020-08-28 LAB — SARS CORONAVIRUS 2 (TAT 6-24 HRS): SARS Coronavirus 2: NEGATIVE

## 2020-08-29 ENCOUNTER — Other Ambulatory Visit: Payer: Self-pay | Admitting: *Deleted

## 2020-08-29 DIAGNOSIS — E1151 Type 2 diabetes mellitus with diabetic peripheral angiopathy without gangrene: Secondary | ICD-10-CM | POA: Diagnosis not present

## 2020-08-29 DIAGNOSIS — R079 Chest pain, unspecified: Secondary | ICD-10-CM | POA: Diagnosis not present

## 2020-08-29 DIAGNOSIS — I2699 Other pulmonary embolism without acute cor pulmonale: Secondary | ICD-10-CM | POA: Diagnosis not present

## 2020-08-29 DIAGNOSIS — D649 Anemia, unspecified: Secondary | ICD-10-CM | POA: Diagnosis not present

## 2020-08-29 DIAGNOSIS — J449 Chronic obstructive pulmonary disease, unspecified: Secondary | ICD-10-CM | POA: Diagnosis not present

## 2020-08-29 DIAGNOSIS — K922 Gastrointestinal hemorrhage, unspecified: Secondary | ICD-10-CM | POA: Diagnosis not present

## 2020-08-29 DIAGNOSIS — E785 Hyperlipidemia, unspecified: Secondary | ICD-10-CM | POA: Diagnosis not present

## 2020-08-29 DIAGNOSIS — M069 Rheumatoid arthritis, unspecified: Secondary | ICD-10-CM | POA: Diagnosis not present

## 2020-08-29 NOTE — Patient Outreach (Signed)
[facility]) Care Management  08/29/2020  [name] May 29, 1940 [id number]  Telephone Assessment-Successful  RN spoke with pt today and introduced [facility] services. Pt did not have time to talk today and requested a call back next week.   Will follow up next week for possible needs.  [name], RN Care Management Coordinator [place] Office [phone]

## 2020-08-30 ENCOUNTER — Other Ambulatory Visit: Payer: Self-pay

## 2020-08-30 DIAGNOSIS — M0689 Other specified rheumatoid arthritis, multiple sites: Secondary | ICD-10-CM | POA: Diagnosis not present

## 2020-08-30 DIAGNOSIS — Z9181 History of falling: Secondary | ICD-10-CM | POA: Diagnosis not present

## 2020-08-30 DIAGNOSIS — Z4689 Encounter for fitting and adjustment of other specified devices: Secondary | ICD-10-CM | POA: Diagnosis not present

## 2020-08-30 DIAGNOSIS — J849 Interstitial pulmonary disease, unspecified: Secondary | ICD-10-CM

## 2020-08-31 ENCOUNTER — Ambulatory Visit (INDEPENDENT_AMBULATORY_CARE_PROVIDER_SITE_OTHER): Payer: Medicare Other | Admitting: Pulmonary Disease

## 2020-08-31 ENCOUNTER — Ambulatory Visit: Payer: Medicare Other

## 2020-08-31 ENCOUNTER — Encounter: Payer: Self-pay | Admitting: Pulmonary Disease

## 2020-08-31 ENCOUNTER — Other Ambulatory Visit: Payer: Self-pay

## 2020-08-31 VITALS — BP 118/70 | HR 91 | Ht 65.0 in | Wt 161.2 lb

## 2020-08-31 DIAGNOSIS — I2699 Other pulmonary embolism without acute cor pulmonale: Secondary | ICD-10-CM | POA: Diagnosis not present

## 2020-08-31 DIAGNOSIS — J849 Interstitial pulmonary disease, unspecified: Secondary | ICD-10-CM | POA: Diagnosis not present

## 2020-08-31 DIAGNOSIS — R0600 Dyspnea, unspecified: Secondary | ICD-10-CM | POA: Diagnosis not present

## 2020-08-31 DIAGNOSIS — R0609 Other forms of dyspnea: Secondary | ICD-10-CM

## 2020-08-31 LAB — PULMONARY FUNCTION TEST
DL/VA % pred: 149 %
DL/VA: 6.05 ml/min/mmHg/L
DLCO cor % pred: 25 %
DLCO cor: 4.92 ml/min/mmHg
DLCO unc % pred: 21 %
DLCO unc: 4.21 ml/min/mmHg
FEF 25-75 Post: 1.49 L/sec
FEF 25-75 Pre: 1.19 L/sec
FEF2575-%Change-Post: 25 %
FEF2575-%Pred-Post: 98 %
FEF2575-%Pred-Pre: 78 %
FEV1-%Change-Post: 5 %
FEV1-%Pred-Post: 82 %
FEV1-%Pred-Pre: 78 %
FEV1-Post: 1.71 L
FEV1-Pre: 1.62 L
FEV1FVC-%Change-Post: 8 %
FEV1FVC-%Pred-Pre: 99 %
FEV6-%Change-Post: -3 %
FEV6-%Pred-Post: 80 %
FEV6-%Pred-Pre: 83 %
FEV6-Post: 2.12 L
FEV6-Pre: 2.19 L
FEV6FVC-%Change-Post: 0 %
FEV6FVC-%Pred-Post: 105 %
FEV6FVC-%Pred-Pre: 105 %
FVC-%Change-Post: -3 %
FVC-%Pred-Post: 76 %
FVC-%Pred-Pre: 79 %
FVC-Post: 2.12 L
FVC-Pre: 2.2 L
Post FEV1/FVC ratio: 80 %
Post FEV6/FVC ratio: 100 %
Pre FEV1/FVC ratio: 74 %
Pre FEV6/FVC Ratio: 100 %
RV % pred: 90 %
RV: 2.19 L
TLC % pred: 80 %
TLC: 4.18 L

## 2020-08-31 NOTE — Progress Notes (Signed)
Full PFT completed today ? ?

## 2020-08-31 NOTE — Patient Instructions (Signed)
Nice to see you  Continue the Budesonide and arformoterol nebulizer twice a day  Use the ipratropium-albuterol (DUONEB) nebulized every 6 hours AS NEEDED for shortness of breath or wheeze.   We will get a scan in August to make sure clots are gone from the chest  Return to clinic in 2 months after VQ scan to discuss results with Dr. Silas Flood

## 2020-08-31 NOTE — Progress Notes (Signed)
_0  ID: [name], female    DOB: September 02, 1940, 80 y.o.   MRN: [id number]  Chief Complaint  Patient presents with   Follow-up    Per patient to discuss PFT results. States she has noticed an increase in her SOB due to the heat.     Referring provider: [name], MD  HPI:   80 year old whom we are seeing in hospital follow-up for dyspnea on exertion, hypoxemia, subsequent development of pulmonary embolus.  Discharge summary x3 reviewed.  Patient feels okay today.  Met in the hospital a few months ago, March 2022.  Distant site of hypoxemia.  At that time she had fallen and had chest pain.  Hypoxemia felt to be due to atelectasis based on imaging likely in the setting of splinting due to chest pain.  She returned home but still felt relatively unwell.  Eventually returned to the hospital 06/10/2020 with worsening shortness of breath, chest pain.  She was found to have pulmonary embolism.  Likely provoked in the setting of relative immobilization.  She was treated with IV heparin transition to Eliquis.  Unfortunately, she had bloody stools and was readmitted 08/2020.  Eliquis was held for a week or so.  No further bleeding.  Overall, her breathing to be slowly improving.  Still some dyspnea.  Seems to be improving with increased activity.  Had PFTs today.  Essentially normal with exception of severely reduced DLCO.  Reviewed recent TTE 07/2020 which does not demonstrate significant elevated pulmonary pressures.  CT chest 05/2020 demonstrated significant tracheobronchomalacia, bilateral linear infiltrates favored to be atelectasis.  No overt interstitial lung disease, fibrosis on my interpretation.  Questionaires / Pulmonary Flowsheets:   ACT:  Asthma Control Test ACT Total Score  05/13/2016 13  01/18/2016 6  10/18/2015 15    MMRC: No flowsheet data found.  Epworth:  No flowsheet data found.  Tests:   FENO:  Lab Results  Component Value Date   NITRICOXIDE 22 12/24/2016     PFT: PFT Results Latest Ref Rng & Units 08/31/2020 01/15/2017  FVC-Pre L 2.20 2.95  FVC-Predicted Pre % 79 101  FVC-Post L 2.12 2.90  FVC-Predicted Post % 76 99  Pre FEV1/FVC % % 74 83  Post FEV1/FCV % % 80 87  FEV1-Pre L 1.62 2.46  FEV1-Predicted Pre % 78 112  FEV1-Post L 1.71 2.53  DLCO uncorrected ml/min/mmHg 4.21 14.92  DLCO UNC% % 21 58  DLCO corrected ml/min/mmHg 4.92 15.41  DLCO COR %Predicted % 25 60  DLVA Predicted % 149 68  TLC L 4.18 5.04  TLC % Predicted % 80 96  RV % Predicted % 90 79  Personally reviewed and interpreted as normal spirometry without bronchodilator response, TLC within normal is, severely reduced DLCO.  WALK:  No flowsheet data found.  Imaging: Personally reviewed and as per EMR and discussion in this note  Lab Results: Notable for mild anemia CBC    Component Value Date/Time   WBC 8.5 08/22/2020 0517   RBC 3.88 08/22/2020 0517   HGB 9.5 (L) 08/22/2020 0517   HGB 13.3 08/30/2015 1649   HCT 32.4 (L) 08/22/2020 0517   HCT 41.2 08/30/2015 1649   PLT 403 (H) 08/22/2020 0517   PLT 379 08/30/2015 1649   MCV 83.5 08/22/2020 0517   MCV 83 08/30/2015 1649   MCH 24.5 (L) 08/22/2020 0517   MCHC 29.3 (L) 08/22/2020 0517   RDW 18.6 (H) 08/22/2020 0517   RDW 15.1 08/30/2015 1649   LYMPHSABS 1.7  06/10/2020 0839   LYMPHSABS 3.7 (H) 08/30/2015 1649   MONOABS 0.6 06/10/2020 0839   EOSABS 0.3 06/10/2020 0839   EOSABS 0.3 08/30/2015 1649   BASOSABS 0.1 06/10/2020 0839   BASOSABS 0.1 08/30/2015 1649    BMET    Component Value Date/Time   NA 139 08/21/2020 0450   K 4.5 08/21/2020 0450   CL 107 08/21/2020 0450   CO2 25 08/21/2020 0450   GLUCOSE 182 (H) 08/21/2020 0450   BUN 11 08/21/2020 0450   CREATININE 0.78 08/21/2020 0450   CREATININE 1.16 (H) 04/08/2017 1025   CALCIUM 8.4 (L) 08/21/2020 0450   GFRNONAA >60 08/21/2020 0450   GFRAA >60 01/19/2018 0821    BNP    Component Value Date/Time   BNP 84.4 07/21/2020 2341    ProBNP No  results found for: PROBNP  Specialty Problems       Pulmonary Problems   Asthma    Qualifier: Diagnosis of  By: Lockie Pares CMA, Katie         SLEEP APNEA    Qualifier: Diagnosis of  By: Lockie Pares CMA, Katie         Sleep apnea    Overview:  Qualifier: Diagnosis of  By: Lockie Pares CMA, Katie       Chronic interstitial lung disease ([place])   Acute hypoxemic respiratory failure (HCC)   Hypoxia    Allergies  Allergen Reactions   Penicillins Hives    Has patient had a PCN reaction causing immediate rash, facial/tongue/throat swelling, SOB or lightheadedness with hypotension: Yes Has patient had a PCN reaction causing severe rash involving mucus membranes or skin necrosis: No Has patient had a PCN reaction that required hospitalization: No Has patient had a PCN reaction occurring within the last 10 years: No If all of the above answers are "NO", then may proceed with Cephalosporin use. Patient has tolerated ceftriaxone in the recent past.   Sulfamethoxazole-Trimethoprim Nausea And Vomiting and Other (See Comments)    Stomach problems   Meperidine Other (See Comments)    Cramping   Meperidine Hcl Other (See Comments)    Cramping    Penicillin G Sodium Hives   Sulfa Antibiotics Nausea And Vomiting and Other (See Comments)    Stomach problems/GI upset   Sulfonamide Derivatives Nausea And Vomiting and Other (See Comments)    "Stomach problems"    Immunization History  Administered Date(s) Administered   Influenza, High Dose Seasonal PF 01/03/2016   Influenza,inj,quad, With Preservative 11/11/2016, 12/09/2017   Influenza-Unspecified 11/23/2016   Zoster Recombinat (Shingrix) 12/17/2016, 06/20/2017    Past Medical History:  Diagnosis Date   Acute pulmonary embolism (HCC)    b/l; dx'ed May '22   Asthma    Complication of anesthesia    Anesthesia told her years ago she got too cold during surgery   Diabetes mellitus without complication ([place])    Emphysema     Fibromyalgia    Osteoporosis    Rheumatoid arthritis(714.0)    Sleep apnea    no cpap    Tobacco History: Social History   Tobacco Use  Smoking Status Former   Pack years: 0.00   Types: Cigarettes   Quit date: 03/11/1994   Years since quitting: 26.4  Smokeless Tobacco Never   Counseling given: Not Answered   Continue to not smoke  Outpatient Encounter Medications as of 08/31/2020  Medication Sig   acetaminophen (TYLENOL) 500 MG tablet Take 500-1,000 mg by mouth every 6 (six) hours as needed (for headaches).  albuterol (VENTOLIN HFA) 108 (90 Base) MCG/ACT inhaler Inhale 2 puffs into the lungs every 6 (six) hours as needed for shortness of breath or wheezing.   apixaban (ELIQUIS) 5 MG TABS tablet Take 1 tablet (5 mg total) by mouth 2 (two) times daily. Start taking only from tomorrow   arformoterol (BROVANA) 15 MCG/2ML NEBU Take 15 mcg by nebulization daily.   atorvastatin (LIPITOR) 20 MG tablet Take 20 mg by mouth at bedtime.   B-D UF III MINI PEN NEEDLES 31G X 5 MM MISC SMARTSIG:1 Each SUB-Q 4 Times Daily   Cholecalciferol (VITAMIN D3) 50 MCG (2000 UT) TABS Take 2,000 Units by mouth at bedtime.   Cyanocobalamin (B-12) 2500 MCG TABS Take 2,500 mcg by mouth at bedtime.    DULoxetine (CYMBALTA) 60 MG capsule Take 60 mg by mouth at bedtime.    ezetimibe (ZETIA) 10 MG tablet Take 10 mg by mouth at bedtime.    insulin NPH-regular Human (70-30) 100 UNIT/ML injection Inject 14 Units into the skin 2 (two) times daily with a meal.   loperamide (IMODIUM A-D) 2 MG tablet Take 2 mg by mouth 4 (four) times daily as needed for diarrhea or loose stools.   metFORMIN (GLUCOPHAGE) 500 MG tablet Take 1,000 mg by mouth at bedtime.   Nebulizers (COMPRESSOR/NEBULIZER) MISC Nebulizer machine/mask/connector for asthma/emphysema   ONETOUCH VERIO test strip    oxyCODONE (OXY IR/ROXICODONE) 5 MG immediate release tablet Take 1 tablet (5 mg total) by mouth every 6 (six) hours as needed for severe pain or  moderate pain.   pantoprazole (PROTONIX) 40 MG tablet Take one tablet once every morning   predniSONE (DELTASONE) 20 MG tablet Take 20 mg by mouth daily as needed (pain).   predniSONE (DELTASONE) 5 MG tablet Take 5 mg by mouth at bedtime.   tolterodine (DETROL LA) 2 MG 24 hr capsule Take 2 mg by mouth daily.   budesonide (PULMICORT) 0.25 MG/2ML nebulizer solution Take 2 mLs (0.25 mg total) by nebulization 2 (two) times daily.   ipratropium-albuterol (DUONEB) 0.5-2.5 (3) MG/3ML SOLN Take 3 mLs by nebulization every 6 (six) hours as needed. (Patient not taking: No sig reported)   [DISCONTINUED] budesonide (PULMICORT) 0.25 MG/2ML nebulizer solution Take 0.25 mg by nebulization 2 (two) times daily as needed (sob/wheezing).   [DISCONTINUED] MYRBETRIQ 50 MG TB24 tablet Take 50 mg by mouth at bedtime. (Patient not taking: Reported on 08/19/2020)   No facility-administered encounter medications on file as of 08/31/2020.     Review of Systems  Review of Systems  No chest pain with exertion no orthopnea no PND no lower extremity swelling.  Comprehensive review of systems otherwise negative Physical Exam  BP 118/70   Pulse 91   Ht 5' 5" (1.651 m)   Wt 161 lb 3.2 oz (73.1 kg)   SpO2 95% Comment: on RA  BMI 26.83 kg/m   Wt Readings from Last 5 Encounters:  08/31/20 161 lb 3.2 oz (73.1 kg)  08/22/20 182 lb 12.2 oz (82.9 kg)  07/22/20 173 lb 1 oz (78.5 kg)  05/31/20 189 lb 6 oz (85.9 kg)  05/26/20 189 lb 6 oz (85.9 kg)    BMI Readings from Last 5 Encounters:  08/31/20 26.83 kg/m  08/22/20 29.51 kg/m  07/22/20 27.95 kg/m  05/31/20 30.57 kg/m  05/26/20 30.57 kg/m     Physical Exam General: In motorized chair, in no acute distress Eyes: EOMI, icterus Pulmonary: Clear to auscultation bilaterally, no wheezes, normal work of breathing   Assessment &  Plan:    Pulmonary embolism: Favor provoked in the setting of previous hospitalization, relative immobilization.  TTE that  hospitalization with normal RV function and size.  Favor 3 months of anticoagulation.  We will pursue ventilation scan as well as chest x-ray for further evaluation of chronic clot resolution.  Her symptom improvement is very reassuring that clot has improved.  Dyspnea: Likely multifactorial related to deconditioning, PE, anemia. PFTs normal with exception of severely reduced DLCO. No evidence or concern for PHTN  on TTE 07/2019.  Return in about 2 months (around 10/31/2020).   Lanier Clam, MD 08/31/2020   This appointment required 45 minutes of patient care (this includes precharting, chart review, review of results, face-to-face care, etc.).

## 2020-09-01 DIAGNOSIS — M06072 Rheumatoid arthritis without rheumatoid factor, left ankle and foot: Secondary | ICD-10-CM | POA: Diagnosis not present

## 2020-09-01 DIAGNOSIS — M06071 Rheumatoid arthritis without rheumatoid factor, right ankle and foot: Secondary | ICD-10-CM | POA: Diagnosis not present

## 2020-09-01 DIAGNOSIS — J449 Chronic obstructive pulmonary disease, unspecified: Secondary | ICD-10-CM | POA: Diagnosis not present

## 2020-09-01 DIAGNOSIS — D649 Anemia, unspecified: Secondary | ICD-10-CM | POA: Diagnosis not present

## 2020-09-01 DIAGNOSIS — I2699 Other pulmonary embolism without acute cor pulmonale: Secondary | ICD-10-CM | POA: Diagnosis not present

## 2020-09-01 DIAGNOSIS — G894 Chronic pain syndrome: Secondary | ICD-10-CM | POA: Diagnosis not present

## 2020-09-01 DIAGNOSIS — R7303 Prediabetes: Secondary | ICD-10-CM | POA: Diagnosis not present

## 2020-09-01 DIAGNOSIS — K922 Gastrointestinal hemorrhage, unspecified: Secondary | ICD-10-CM | POA: Diagnosis not present

## 2020-09-01 DIAGNOSIS — E11618 Type 2 diabetes mellitus with other diabetic arthropathy: Secondary | ICD-10-CM | POA: Diagnosis not present

## 2020-09-01 DIAGNOSIS — J439 Emphysema, unspecified: Secondary | ICD-10-CM | POA: Diagnosis not present

## 2020-09-04 DIAGNOSIS — Z961 Presence of intraocular lens: Secondary | ICD-10-CM | POA: Diagnosis not present

## 2020-09-04 DIAGNOSIS — H43393 Other vitreous opacities, bilateral: Secondary | ICD-10-CM | POA: Diagnosis not present

## 2020-09-04 DIAGNOSIS — E119 Type 2 diabetes mellitus without complications: Secondary | ICD-10-CM | POA: Diagnosis not present

## 2020-09-04 DIAGNOSIS — H524 Presbyopia: Secondary | ICD-10-CM | POA: Diagnosis not present

## 2020-09-07 ENCOUNTER — Ambulatory Visit (INDEPENDENT_AMBULATORY_CARE_PROVIDER_SITE_OTHER): Payer: Medicare Other | Admitting: Podiatry

## 2020-09-07 ENCOUNTER — Other Ambulatory Visit: Payer: Self-pay

## 2020-09-07 DIAGNOSIS — M79675 Pain in left toe(s): Secondary | ICD-10-CM

## 2020-09-07 DIAGNOSIS — M79674 Pain in right toe(s): Secondary | ICD-10-CM

## 2020-09-07 DIAGNOSIS — B351 Tinea unguium: Secondary | ICD-10-CM

## 2020-09-07 DIAGNOSIS — E1142 Type 2 diabetes mellitus with diabetic polyneuropathy: Secondary | ICD-10-CM

## 2020-09-07 NOTE — Progress Notes (Signed)
  Subjective:  Patient ID: [name], female    DOB: 11-19-1940,  MRN: [id number]  No chief complaint on file.  80 y.o. female presents with the above complaint. History confirmed with patient. Has been in and out of the hospital and state her nails grew very long.  Objective:  Physical Exam: warm, good capillary refill, nail exam onychomycosis of the toenails, no trophic changes or ulcerative lesions. DP pulses palpable, PT pulses palpable, and protective sensation absent. Severe rheumatoid changes to bilateral feet  No images are attached to the encounter.  Assessment:   1. Pain due to onychomycosis of toenails of both feet   2. Diabetic polyneuropathy associated with type 2 diabetes mellitus ([place])    Plan:  Patient was evaluated and treated and all questions answered.  Onychomycosis -Patient is diabetic with a qualifying condition for at risk foot care.  Procedure: Nail Debridement Type of Debridement: manual, sharp debridement. Instrumentation: Nail nipper, rotary burr. Number of Nails: 10  Return in about 3 months (around 12/08/2020).

## 2020-09-11 DIAGNOSIS — M810 Age-related osteoporosis without current pathological fracture: Secondary | ICD-10-CM | POA: Diagnosis not present

## 2020-09-11 DIAGNOSIS — G894 Chronic pain syndrome: Secondary | ICD-10-CM | POA: Diagnosis not present

## 2020-09-11 DIAGNOSIS — J439 Emphysema, unspecified: Secondary | ICD-10-CM | POA: Diagnosis not present

## 2020-09-11 DIAGNOSIS — D631 Anemia in chronic kidney disease: Secondary | ICD-10-CM | POA: Diagnosis not present

## 2020-09-11 DIAGNOSIS — H903 Sensorineural hearing loss, bilateral: Secondary | ICD-10-CM | POA: Diagnosis not present

## 2020-09-11 DIAGNOSIS — D801 Nonfamilial hypogammaglobulinemia: Secondary | ICD-10-CM | POA: Diagnosis not present

## 2020-09-11 DIAGNOSIS — Z794 Long term (current) use of insulin: Secondary | ICD-10-CM | POA: Diagnosis not present

## 2020-09-11 DIAGNOSIS — Q2733 Arteriovenous malformation of digestive system vessel: Secondary | ICD-10-CM | POA: Diagnosis not present

## 2020-09-11 DIAGNOSIS — I2699 Other pulmonary embolism without acute cor pulmonale: Secondary | ICD-10-CM | POA: Diagnosis not present

## 2020-09-11 DIAGNOSIS — J45909 Unspecified asthma, uncomplicated: Secondary | ICD-10-CM | POA: Diagnosis not present

## 2020-09-11 DIAGNOSIS — J84115 Respiratory bronchiolitis interstitial lung disease: Secondary | ICD-10-CM | POA: Diagnosis not present

## 2020-09-11 DIAGNOSIS — N1831 Chronic kidney disease, stage 3a: Secondary | ICD-10-CM | POA: Diagnosis not present

## 2020-09-11 DIAGNOSIS — K317 Polyp of stomach and duodenum: Secondary | ICD-10-CM | POA: Diagnosis not present

## 2020-09-11 DIAGNOSIS — E11618 Type 2 diabetes mellitus with other diabetic arthropathy: Secondary | ICD-10-CM | POA: Diagnosis not present

## 2020-09-11 DIAGNOSIS — G4733 Obstructive sleep apnea (adult) (pediatric): Secondary | ICD-10-CM | POA: Diagnosis not present

## 2020-09-11 DIAGNOSIS — I9589 Other hypotension: Secondary | ICD-10-CM | POA: Diagnosis not present

## 2020-09-11 DIAGNOSIS — E1122 Type 2 diabetes mellitus with diabetic chronic kidney disease: Secondary | ICD-10-CM | POA: Diagnosis not present

## 2020-09-11 DIAGNOSIS — Z87891 Personal history of nicotine dependence: Secondary | ICD-10-CM | POA: Diagnosis not present

## 2020-09-11 DIAGNOSIS — Z7984 Long term (current) use of oral hypoglycemic drugs: Secondary | ICD-10-CM | POA: Diagnosis not present

## 2020-09-11 DIAGNOSIS — K529 Noninfective gastroenteritis and colitis, unspecified: Secondary | ICD-10-CM | POA: Diagnosis not present

## 2020-09-11 DIAGNOSIS — D849 Immunodeficiency, unspecified: Secondary | ICD-10-CM | POA: Diagnosis not present

## 2020-09-11 DIAGNOSIS — M797 Fibromyalgia: Secondary | ICD-10-CM | POA: Diagnosis not present

## 2020-09-11 DIAGNOSIS — M06071 Rheumatoid arthritis without rheumatoid factor, right ankle and foot: Secondary | ICD-10-CM | POA: Diagnosis not present

## 2020-09-11 DIAGNOSIS — K5790 Diverticulosis of intestine, part unspecified, without perforation or abscess without bleeding: Secondary | ICD-10-CM | POA: Diagnosis not present

## 2020-09-11 DIAGNOSIS — M06072 Rheumatoid arthritis without rheumatoid factor, left ankle and foot: Secondary | ICD-10-CM | POA: Diagnosis not present

## 2020-09-12 ENCOUNTER — Encounter: Payer: Self-pay | Admitting: *Deleted

## 2020-09-12 ENCOUNTER — Other Ambulatory Visit: Payer: Self-pay | Admitting: *Deleted

## 2020-09-12 NOTE — Patient Outreach (Signed)
[facility]) Care Management  [place]  09/12/2020   [name] 04/27/1940 [id number]    Covering for assigned RNCM.  Outreach attempt #3, successful.  Was recently in hospital for DM management and GI bleed.  State she has not had any further bleeding since discharge, has had home health nurse come out for labs, all reportedly normal.  She has nursing and PT, was seen by PA at PCP office about 2 weeks ago.  Will follow up with PCP on 7/12.    Member was in SNF but didn't like it, discharged back home.  Still does not have adequate care in the home and very little support from family.  She is unable to cook, state she fixes small things like cans of soup when she is able to.  Discussed having delivered meals, state she will not eat them and declines.    Denies any urgent concerns, encouraged to contact this care manager with questions.  Encounter Medications:  Outpatient Encounter Medications as of 09/12/2020  Medication Sig Note   acetaminophen (TYLENOL) 500 MG tablet Take 500-1,000 mg by mouth every 6 (six) hours as needed (for headaches).    albuterol (VENTOLIN HFA) 108 (90 Base) MCG/ACT inhaler Inhale 2 puffs into the lungs every 6 (six) hours as needed for shortness of breath or wheezing.    apixaban (ELIQUIS) 5 MG TABS tablet Take 1 tablet (5 mg total) by mouth 2 (two) times daily. Start taking only from tomorrow    arformoterol (BROVANA) 15 MCG/2ML NEBU Take 15 mcg by nebulization daily.    atorvastatin (LIPITOR) 20 MG tablet Take 20 mg by mouth at bedtime.    B-D UF III MINI PEN NEEDLES 31G X 5 MM MISC SMARTSIG:1 Each SUB-Q 4 Times Daily    budesonide (PULMICORT) 0.25 MG/2ML nebulizer solution Take 2 mLs (0.25 mg total) by nebulization 2 (two) times daily. 07/22/2020: LF #120 ml's/30 days on 05/26/2020   Cholecalciferol (VITAMIN D3) 50 MCG (2000 UT) TABS Take 2,000 Units by mouth at bedtime.    Cyanocobalamin (B-12) 2500 MCG TABS Take 2,500 mcg by mouth at  bedtime.     DULoxetine (CYMBALTA) 60 MG capsule Take 60 mg by mouth at bedtime.     ezetimibe (ZETIA) 10 MG tablet Take 10 mg by mouth at bedtime.     insulin NPH-regular Human (70-30) 100 UNIT/ML injection Inject 14 Units into the skin 2 (two) times daily with a meal.    ipratropium-albuterol (DUONEB) 0.5-2.5 (3) MG/3ML SOLN Take 3 mLs by nebulization every 6 (six) hours as needed. (Patient not taking: No sig reported) 08/19/2020: Hasn't started.   loperamide (IMODIUM A-D) 2 MG tablet Take 2 mg by mouth 4 (four) times daily as needed for diarrhea or loose stools.    metFORMIN (GLUCOPHAGE) 500 MG tablet Take 1,000 mg by mouth at bedtime.    Nebulizers (COMPRESSOR/NEBULIZER) MISC Nebulizer machine/mask/connector for asthma/emphysema    ONETOUCH VERIO test strip  05/22/2020: Testing supplies    oxyCODONE (OXY IR/ROXICODONE) 5 MG immediate release tablet Take 1 tablet (5 mg total) by mouth every 6 (six) hours as needed for severe pain or moderate pain.    pantoprazole (PROTONIX) 40 MG tablet Take one tablet once every morning    predniSONE (DELTASONE) 20 MG tablet Take 20 mg by mouth daily as needed (pain).    predniSONE (DELTASONE) 5 MG tablet Take 5 mg by mouth at bedtime.    tolterodine (DETROL LA) 2 MG 24 hr capsule  Take 2 mg by mouth daily. 08/19/2020: Hasn't started.   [DISCONTINUED] MYRBETRIQ 50 MG TB24 tablet Take 50 mg by mouth at bedtime. (Patient not taking: Reported on 08/19/2020) 05/22/2020: LF 05/05/20, 30 DS    No facility-administered encounter medications on file as of 09/12/2020.    Functional Status:  In your present state of health, do you have any difficulty performing the following activities: 08/19/2020 07/21/2020  Hearing? Tempie Donning  Vision? N N  Difficulty concentrating or making decisions? N N  Walking or climbing stairs? Y Y  Dressing or bathing? Y N  Doing errands, shopping? Y Y  Some recent data might be hidden    Fall/Depression Screening: Fall Risk  09/12/2020 06/05/2020  07/23/2019  Falls in the past year? 0 1 1  Number falls in past yr: 0 0 0  Injury with Fall? 0 0 0  Risk for fall due to : - History of fall(s) History of fall(s);Impaired balance/gait;Impaired mobility;Impaired vision;Medication side effect  Follow up - - Falls evaluation completed;Education provided;Falls prevention discussed   PHQ 2/9 Scores 09/12/2020 06/05/2020 07/23/2019 01/08/2019 04/08/2017  PHQ - 2 Score 0 0 0 0 0    Assessment:   Care Plan Care Plan : Diabetes Type 2 (Adult)  Updates made by [name], RN since 09/12/2020 12:00 AM     Problem: Glycemic Management (Diabetes, Type 2)      Long-Range Goal: Glycemic Management Optimized   Start Date: 09/12/2020  Expected End Date: 12/11/2020  Priority: High  Note:   Evidence-based guidance:  Anticipate A1C testing (point-of-care) every 3 to 6 months based on goal attainment.  Review mutually-set A1C goal or target range.  Anticipate use of antihyperglycemic with or without insulin and periodic adjustments; consider active involvement of pharmacist.  Provide medical nutrition therapy and development of individualized eating.  Compare self-reported symptoms of hypo or hyperglycemia to blood glucose levels, diet and fluid intake, current medications, psychosocial and physiologic stressors, change in activity and barriers to care adherence.  Promote self-monitoring of blood glucose levels.  Assess and address barriers to management plan, such as food insecurity, age, developmental ability, depression, anxiety, fear of hypoglycemia or weight gain, as well as medication cost, side effects and complicated regimen.  Consider referral to community-based diabetes education program, visiting nurse, community health worker or health coach.  Encourage regular dental care for treatment of periodontal disease; refer to dental provider when needed.   Notes:     Task: Alleviate Barriers to Glycemic Management   Note:   Care Management  Activities:    - blood glucose monitoring encouraged - dental care encouraged - mutual A1C goal set or reviewed    Notes:     Problem: Disease Progression (Diabetes, Type 2)      Goal: Disease Progression Prevented or Minimized   Start Date: 09/12/2020  Expected End Date: 10/12/2020  Priority: Medium  Note:   Evidence-based guidance:  Prepare patient for laboratory and diagnostic exams based on risk and presentation.  Encourage lifestyle changes, such as increased intake of plant-based foods, stress reduction, consistent physical activity and smoking cessation to prevent long-term complications and chronic disease.   Individualize activity and exercise recommendations while considering potential limitations, such as neuropathy, retinopathy or the ability to prevent hyperglycemia or hypoglycemia.   Prepare patient for use of pharmacologic therapy that may include antihypertensive, analgesic, prostaglandin E1 with periodic adjustments, based on presenting chronic condition and laboratory results.  Assess signs/symptoms and risk factors for hypertension, sleep-disordered breathing, neuropathy (  including changes in gait and balance), retinopathy, nephropathy and sexual dysfunction.  Address pregnancy planning and contraceptive choice, especially when prescribing antihypertensive or statin.  Ensure completion of annual comprehensive foot exam and dilated eye exam.   Implement additional individualized goals and interventions based on identified risk factors.  Prepare patient for consultation or referral for specialist care, such as ophthalmology, neurology, cardiology, podiatry, nephrology or perinatology.   Notes:     Task: Monitor and Manage Follow-up for Comorbidities   Note:   Care Management Activities:    - healthy lifestyle promoted - reduction of sedentary activity encouraged    Notes:       Goals Addressed             This Visit's Progress    Set My Target A1C-Diabetes  Type 2       Timeframe:  Long-Range Goal Priority:  High Start Date:        7/5                     Expected End Date:       10/5                Follow Up Date 10/13/2020    Barriers: Health Behaviors Knowledge Support System  - set target A1C    Why is this important?   Your target A1C is decided together by you and your doctor.  It is based on several things like your age and other health issues.    Notes:   7/5 - Patient has minimal support in the home, unable to adequately care for herself but adamantly refuses to return to SNF      [facility] - Monitor and Manage My Blood Sugar-Diabetes Type 2       Timeframe:  Short-Term Goal Priority:  Medium Start Date:       7/5                      Expected End Date:        8/5               Barriers: Knowledge Support System  Follow Up Date 10/13/2020    - check blood sugar at prescribed times - check blood sugar if I feel it is too high or too low - take the blood sugar meter to all doctor visits    Why is this important?   Checking your blood sugar at home helps to keep it from getting very high or very low.  Writing the results in a diary or log helps the doctor know how to care for you.  Your blood sugar log should have the time, date and the results.  Also, write down the amount of insulin or other medicine that you take.  Other information, like what you ate, exercise done and how you were feeling, will also be helpful.     Notes:   7/5 - Report blood sugars are ok, encouraged to continue daily monitoring         Plan:  Follow-up: Patient agrees to Care Plan and Follow-up. Follow-up in 2 week(s)  Will provide update to assigned RNCM.  [name], [place], MSN [place] [phone]

## 2020-09-14 ENCOUNTER — Emergency Department (HOSPITAL_COMMUNITY)
Admission: EM | Admit: 2020-09-14 | Discharge: 2020-09-16 | Disposition: A | Discharge status: Home / Self Care | Payer: Medicare Other | Attending: Emergency Medicine | Admitting: Emergency Medicine

## 2020-09-14 ENCOUNTER — Emergency Department (HOSPITAL_COMMUNITY): Payer: Medicare Other

## 2020-09-14 ENCOUNTER — Other Ambulatory Visit: Payer: Self-pay

## 2020-09-14 ENCOUNTER — Encounter (HOSPITAL_COMMUNITY): Payer: Self-pay | Admitting: Emergency Medicine

## 2020-09-14 DIAGNOSIS — M791 Myalgia, unspecified site: Secondary | ICD-10-CM

## 2020-09-14 DIAGNOSIS — Z7901 Long term (current) use of anticoagulants: Secondary | ICD-10-CM | POA: Insufficient documentation

## 2020-09-14 DIAGNOSIS — J45909 Unspecified asthma, uncomplicated: Secondary | ICD-10-CM | POA: Insufficient documentation

## 2020-09-14 DIAGNOSIS — Z87891 Personal history of nicotine dependence: Secondary | ICD-10-CM | POA: Insufficient documentation

## 2020-09-14 DIAGNOSIS — R0600 Dyspnea, unspecified: Secondary | ICD-10-CM | POA: Diagnosis not present

## 2020-09-14 DIAGNOSIS — Z794 Long term (current) use of insulin: Secondary | ICD-10-CM | POA: Diagnosis not present

## 2020-09-14 DIAGNOSIS — R531 Weakness: Secondary | ICD-10-CM

## 2020-09-14 DIAGNOSIS — Z96653 Presence of artificial knee joint, bilateral: Secondary | ICD-10-CM | POA: Insufficient documentation

## 2020-09-14 DIAGNOSIS — I517 Cardiomegaly: Secondary | ICD-10-CM | POA: Diagnosis not present

## 2020-09-14 DIAGNOSIS — N183 Chronic kidney disease, stage 3 unspecified: Secondary | ICD-10-CM | POA: Diagnosis not present

## 2020-09-14 DIAGNOSIS — Z7984 Long term (current) use of oral hypoglycemic drugs: Secondary | ICD-10-CM | POA: Diagnosis not present

## 2020-09-14 DIAGNOSIS — Z20822 Contact with and (suspected) exposure to covid-19: Secondary | ICD-10-CM | POA: Diagnosis not present

## 2020-09-14 DIAGNOSIS — E1122 Type 2 diabetes mellitus with diabetic chronic kidney disease: Secondary | ICD-10-CM | POA: Insufficient documentation

## 2020-09-14 DIAGNOSIS — D631 Anemia in chronic kidney disease: Secondary | ICD-10-CM | POA: Diagnosis not present

## 2020-09-14 DIAGNOSIS — R000 Tachycardia, unspecified: Secondary | ICD-10-CM | POA: Diagnosis not present

## 2020-09-14 LAB — CBC WITH DIFFERENTIAL/PLATELET
Abs Immature Granulocytes: 0.07 10*3/uL (ref 0.00–0.07)
Basophils Absolute: 0.1 10*3/uL (ref 0.0–0.1)
Basophils Relative: 0 %
Eosinophils Absolute: 0.4 10*3/uL (ref 0.0–0.5)
Eosinophils Relative: 3 %
HCT: 34.8 % — ABNORMAL LOW (ref 36.0–46.0)
Hemoglobin: 10.3 g/dL — ABNORMAL LOW (ref 12.0–15.0)
Immature Granulocytes: 1 %
Lymphocytes Relative: 10 %
Lymphs Abs: 1.3 10*3/uL (ref 0.7–4.0)
MCH: 23.3 pg — ABNORMAL LOW (ref 26.0–34.0)
MCHC: 29.6 g/dL — ABNORMAL LOW (ref 30.0–36.0)
MCV: 78.7 fL — ABNORMAL LOW (ref 80.0–100.0)
Monocytes Absolute: 0.5 10*3/uL (ref 0.1–1.0)
Monocytes Relative: 4 %
Neutro Abs: 10.7 10*3/uL — ABNORMAL HIGH (ref 1.7–7.7)
Neutrophils Relative %: 82 %
Platelets: 487 10*3/uL — ABNORMAL HIGH (ref 150–400)
RBC: 4.42 MIL/uL (ref 3.87–5.11)
RDW: 17.9 % — ABNORMAL HIGH (ref 11.5–15.5)
WBC: 13 10*3/uL — ABNORMAL HIGH (ref 4.0–10.5)
nRBC: 0 % (ref 0.0–0.2)

## 2020-09-14 LAB — COMPREHENSIVE METABOLIC PANEL
ALT: 10 U/L (ref 0–44)
AST: 14 U/L — ABNORMAL LOW (ref 15–41)
Albumin: 2.7 g/dL — ABNORMAL LOW (ref 3.5–5.0)
Alkaline Phosphatase: 80 U/L (ref 38–126)
Anion gap: 12 (ref 5–15)
BUN: 19 mg/dL (ref 8–23)
CO2: 23 mmol/L (ref 22–32)
Calcium: 9.1 mg/dL (ref 8.9–10.3)
Chloride: 102 mmol/L (ref 98–111)
Creatinine, Ser: 0.82 mg/dL (ref 0.44–1.00)
GFR, Estimated: >60 mL/min (ref >60)
Glucose, Bld: 163 mg/dL — ABNORMAL HIGH (ref 70–99)
Potassium: 3.8 mmol/L (ref 3.5–5.1)
Sodium: 137 mmol/L (ref 135–145)
Total Bilirubin: 1.1 mg/dL (ref 0.3–1.2)
Total Protein: 6.4 g/dL — ABNORMAL LOW (ref 6.5–8.1)

## 2020-09-14 LAB — URINALYSIS, ROUTINE W REFLEX MICROSCOPIC
Bacteria, UA: NONE SEEN
Bilirubin Urine: NEGATIVE
Glucose, UA: NEGATIVE mg/dL
Hgb urine dipstick: NEGATIVE
Ketones, ur: 20 mg/dL — AB
Leukocytes,Ua: NEGATIVE
Nitrite: NEGATIVE
Protein, ur: 30 mg/dL — AB
Specific Gravity, Urine: 1.02 (ref 1.005–1.030)
pH: 5 (ref 5.0–8.0)

## 2020-09-14 LAB — TROPONIN I (HIGH SENSITIVITY)
Troponin I (High Sensitivity): 9 ng/L (ref <18)
Troponin I (High Sensitivity): 8 ng/L (ref <18)

## 2020-09-14 LAB — TYPE AND SCREEN
ABO/RH(D): B NEG
Antibody Screen: POSITIVE

## 2020-09-14 LAB — POC OCCULT BLOOD, ED: Fecal Occult Bld: NEGATIVE

## 2020-09-14 LAB — RESP PANEL BY RT-PCR (FLU A&B, COVID) ARPGX2
Influenza A by PCR: NEGATIVE
Influenza B by PCR: NEGATIVE
SARS Coronavirus 2 by RT PCR: NEGATIVE

## 2020-09-14 LAB — PROTIME-INR
INR: 1.5 — ABNORMAL HIGH (ref 0.8–1.2)
Prothrombin Time: 18.1 seconds — ABNORMAL HIGH (ref 11.4–15.2)

## 2020-09-14 LAB — CK: Total CK: 12 U/L — ABNORMAL LOW (ref 38–234)

## 2020-09-14 MED ORDER — ALBUTEROL SULFATE HFA 108 (90 BASE) MCG/ACT IN AERS: 2.0000 | INHALATION_SPRAY | Freq: Four times a day (QID) | RESPIRATORY_TRACT | Status: DC | PRN

## 2020-09-14 MED ORDER — METFORMIN HCL 500 MG PO TABS
1000.0000 mg | ORAL_TABLET | Freq: Every day | ORAL | Status: DC
  Administered 2020-09-14 – 2020-09-16 (×2): 1000 mg via ORAL
  Filled 2020-09-14 (×2): qty 2

## 2020-09-14 MED ORDER — ATORVASTATIN CALCIUM 10 MG PO TABS
20.0000 mg | ORAL_TABLET | Freq: Every day | ORAL | Status: DC
  Administered 2020-09-14 – 2020-09-16 (×2): 20 mg via ORAL
  Filled 2020-09-14 (×2): qty 2

## 2020-09-14 MED ORDER — VITAMIN D3 25 MCG (1000 UNIT) PO TABS
2000.0000 [IU] | ORAL_TABLET | Freq: Every day | ORAL | Status: DC
  Administered 2020-09-14 – 2020-09-16 (×2): 2000 [IU] via ORAL
  Filled 2020-09-14 (×3): qty 2

## 2020-09-14 MED ORDER — EZETIMIBE 10 MG PO TABS
10.0000 mg | ORAL_TABLET | Freq: Every day | ORAL | Status: DC
  Administered 2020-09-14 – 2020-09-16 (×2): 10 mg via ORAL
  Filled 2020-09-14 (×2): qty 1

## 2020-09-14 MED ORDER — ARFORMOTEROL TARTRATE 15 MCG/2ML IN NEBU
15.0000 ug | INHALATION_SOLUTION | Freq: Every day | RESPIRATORY_TRACT | Status: DC
  Filled 2020-09-14 (×4): qty 2

## 2020-09-14 MED ORDER — VITAMIN B-12 1000 MCG PO TABS
2500.0000 ug | ORAL_TABLET | Freq: Every day | ORAL | Status: DC
  Administered 2020-09-14 – 2020-09-16 (×2): 2500 ug via ORAL
  Filled 2020-09-14 (×2): qty 2.5

## 2020-09-14 MED ORDER — INSULIN ASPART PROT & ASPART (70-30 MIX) 100 UNIT/ML ~~LOC~~ SUSP
14.0000 [IU] | Freq: Two times a day (BID) | SUBCUTANEOUS | Status: DC
  Administered 2020-09-15 (×2): 14 [IU] via SUBCUTANEOUS
  Filled 2020-09-14: qty 10

## 2020-09-14 MED ORDER — PREDNISONE 5 MG PO TABS
5.0000 mg | ORAL_TABLET | Freq: Every day | ORAL | Status: DC
  Administered 2020-09-14 – 2020-09-16 (×2): 5 mg via ORAL
  Filled 2020-09-14 (×2): qty 1

## 2020-09-14 MED ORDER — DULOXETINE HCL 30 MG PO CPEP
60.0000 mg | ORAL_CAPSULE | Freq: Every day | ORAL | Status: DC
  Administered 2020-09-14 – 2020-09-16 (×3): 60 mg via ORAL
  Filled 2020-09-14 (×3): qty 2

## 2020-09-14 MED ORDER — BUDESONIDE 0.25 MG/2ML IN SUSP
0.2500 mg | Freq: Two times a day (BID) | RESPIRATORY_TRACT | Status: DC
  Filled 2020-09-14 (×2): qty 2

## 2020-09-14 MED ORDER — FESOTERODINE FUMARATE ER 4 MG PO TB24
4.0000 mg | ORAL_TABLET | Freq: Every day | ORAL | Status: DC
  Administered 2020-09-14 – 2020-09-16 (×2): 4 mg via ORAL
  Filled 2020-09-14 (×3): qty 1

## 2020-09-14 MED ORDER — MORPHINE SULFATE (PF) 4 MG/ML IV SOLN
4.0000 mg | Freq: Once | INTRAVENOUS | Status: AC
  Administered 2020-09-14: 4 mg via INTRAVENOUS
  Filled 2020-09-14: qty 1

## 2020-09-14 MED ORDER — SODIUM CHLORIDE 0.9 % IV BOLUS: 500.0000 mL | Freq: Once | INTRAVENOUS | Status: DC

## 2020-09-14 MED ORDER — OXYCODONE HCL 5 MG PO TABS
5.0000 mg | ORAL_TABLET | Freq: Four times a day (QID) | ORAL | Status: DC | PRN
  Administered 2020-09-14 – 2020-09-16 (×3): 5 mg via ORAL
  Filled 2020-09-14 (×3): qty 1

## 2020-09-14 MED ORDER — ALBUTEROL SULFATE (2.5 MG/3ML) 0.083% IN NEBU: 2.5000 mg | INHALATION_SOLUTION | Freq: Four times a day (QID) | RESPIRATORY_TRACT | Status: DC | PRN

## 2020-09-14 MED ORDER — ACETAMINOPHEN 500 MG PO TABS
1000.0000 mg | ORAL_TABLET | Freq: Four times a day (QID) | ORAL | Status: DC | PRN
  Administered 2020-09-16: 1000 mg via ORAL
  Filled 2020-09-14 (×2): qty 2

## 2020-09-14 MED ORDER — APIXABAN 5 MG PO TABS
5.0000 mg | ORAL_TABLET | Freq: Two times a day (BID) | ORAL | Status: DC
  Administered 2020-09-14 – 2020-09-16 (×3): 5 mg via ORAL
  Filled 2020-09-14 (×4): qty 1

## 2020-09-14 MED ORDER — PANTOPRAZOLE SODIUM 40 MG PO TBEC
40.0000 mg | DELAYED_RELEASE_TABLET | Freq: Every day | ORAL | Status: DC
  Administered 2020-09-16: 40 mg via ORAL
  Filled 2020-09-14 (×2): qty 1

## 2020-09-14 NOTE — [place] FL2 (Signed)
[place] LEVEL OF CARE SCREENING TOOL     IDENTIFICATION  Patient Name: [name] Birthdate: May 02, 1940 Sex: female Admission Date (Current Location): 09/14/2020  [facility] and [place] Number:  Herbalist and Address:  [facility],  [place], [place]      Provider Number: [id number]  Attending Physician Name and Address:  Valarie Merino, MD  Relative Name and Phone Number:  Quentin Ore son [phone]    Current Level of Care: Hospital Recommended Level of Care: [place] Prior Approval Number:    Date Approved/Denied:   PASRR Number: [id number]  Discharge Plan: SNF    Current Diagnoses: Patient Active Problem List   Diagnosis Date Noted   GI bleed 08/20/2020   Acute lower GI bleeding 08/19/2020   Acute pulmonary embolism ([place]) 07/21/2020   Hypoxia 05/23/2020   Acute hypoxemic respiratory failure ([place]) 05/22/2020   Rotator cuff arthropathy of left shoulder 12/13/2019   Pain in joint of right shoulder 12/10/2019   Pain in joint of left shoulder 12/10/2019   Hearing loss, sensorineural, 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   Tinnitus, left 01/08/2019   DM type 2, not at goal [facility]) 06/03/2018   Frequent infections 05/21/2018   Gangrene ([place]) 05/21/2018   Pain in finger 05/07/2018   Family history of aneurysm 05/07/2018   Diabetes mellitus due to underlying condition, controlled, with complication, without long-term current use of insulin ([place])    Lower urinary tract infectious disease    Palpitations 01/17/2018   Dehydration 01/17/2018   Hypotension 01/17/2018   Symptom of blood in stool 01/17/2018   Diarrhea in adult patient 01/17/2018   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   Aftercare following joint replacement 08/21/2017   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 with presence of antibiotic-impregnated cement spacer 06/05/2017   Closed displaced  transverse fracture of right patella 05/06/2017   Anemia 04/16/2017   Chronic interstitial lung disease ([place]) 04/16/2017   Chronic kidney disease (CKD), stage III (moderate) ([place]) 04/16/2017   Other hyperlipidemia 04/16/2017   Type 2 diabetes mellitus without complication, without long-term current use of insulin ([place]) 04/16/2017   Pain in right knee 03/25/2017   Aftercare 03/25/2017   Arthritis, septic, knee ([place]) 03/09/2017   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) 01/29/2016   Septic prepatellar bursitis of right knee 10/26/2014   Prepatellar bursitis of right knee 08/28/2014   DYSPHAGIA ORAL PHASE 06/18/2007   Asthma 05/14/2007   SLEEP APNEA 05/14/2007   Sleep apnea 05/14/2007   GERD 05/13/2007   Rheumatoid arthritis ([place]) 05/13/2007   OSTEOPOROSIS 05/13/2007   DIARRHEA, CHRONIC 05/13/2007    Orientation RESPIRATION BLADDER Height & Weight     Self, Time, Situation, Place  Normal Incontinent Weight: 73.1 kg Height:  5\' 5"  (165.1 cm)  BEHAVIORAL SYMPTOMS/MOOD NEUROLOGICAL BOWEL NUTRITION STATUS      Continent Diet (Carb Modified)  AMBULATORY STATUS COMMUNICATION OF NEEDS Skin   Extensive Assist Verbally Normal                       Personal Care Assistance Level of Assistance  Bathing, Feeding, Dressing Bathing Assistance: Limited assistance Feeding assistance: Independent Dressing Assistance: Maximum assistance     Functional Limitations Info  Sight, Hearing, Speech Sight Info:  Adequate Hearing Info: Adequate Speech Info: Adequate    SPECIAL CARE FACTORS FREQUENCY  PT (By licensed PT), OT (By licensed OT)     PT Frequency: 5x per week OT Frequency: 5x per week            Contractures Contractures Info: Not present    Additional Factors Info  Code Status,  Allergies Code Status Info: Full Code Allergies Info: Penicillins, Sulfamethoxazole-trimethoprim, Penicillin G Sodium, Sulfa Antibiotics, Sulfonamide Derivatives, Meperidine, Meperidine Hcl           Current Medications (09/14/2020):  This is the current hospital active medication list No current facility-administered medications for this encounter.   Current Outpatient Medications  Medication Sig Dispense Refill   acetaminophen (TYLENOL) 500 MG tablet Take 500-1,000 mg by mouth every 6 (six) hours as needed (for headaches).     albuterol (VENTOLIN HFA) 108 (90 Base) MCG/ACT inhaler Inhale 2 puffs into the lungs every 6 (six) hours as needed for shortness of breath or wheezing.     apixaban (ELIQUIS) 5 MG TABS tablet Take 1 tablet (5 mg total) by mouth 2 (two) times daily. Start taking only from tomorrow 60 tablet 0   arformoterol (BROVANA) 15 MCG/2ML NEBU Take 15 mcg by nebulization daily.     atorvastatin (LIPITOR) 20 MG tablet Take 20 mg by mouth at bedtime.     B-D UF III MINI PEN NEEDLES 31G X 5 MM MISC SMARTSIG:1 Each SUB-Q 4 Times Daily     budesonide (PULMICORT) 0.25 MG/2ML nebulizer solution Take 2 mLs (0.25 mg total) by nebulization 2 (two) times daily. 120 mL 0   Cholecalciferol (VITAMIN D3) 50 MCG (2000 UT) TABS Take 2,000 Units by mouth at bedtime.     Cyanocobalamin (B-12) 2500 MCG TABS Take 2,500 mcg by mouth at bedtime.      DULoxetine (CYMBALTA) 60 MG capsule Take 60 mg by mouth at bedtime.      ezetimibe (ZETIA) 10 MG tablet Take 10 mg by mouth at bedtime.      insulin NPH-regular Human (70-30) 100 UNIT/ML injection Inject 14 Units into the skin 2 (two) times daily with a meal.     ipratropium-albuterol (DUONEB) 0.5-2.5 (3) MG/3ML SOLN Take 3 mLs by nebulization every 6 (six) hours as needed. (Patient not taking: No sig reported)     loperamide (IMODIUM A-D) 2 MG tablet Take 2 mg by mouth 4 (four) times daily as needed for diarrhea or loose stools.     metFORMIN (GLUCOPHAGE)  500 MG tablet Take 1,000 mg by mouth at bedtime.     Nebulizers (COMPRESSOR/NEBULIZER) MISC Nebulizer machine/mask/connector for asthma/emphysema 1 each 0   ONETOUCH VERIO test strip      oxyCODONE (OXY IR/ROXICODONE) 5 MG immediate release tablet Take 1 tablet (5 mg total) by mouth every 6 (six) hours as needed for severe pain or moderate pain. 5 tablet 0   pantoprazole (PROTONIX) 40 MG tablet Take one tablet once every morning 30 tablet 5   predniSONE (DELTASONE) 20 MG tablet Take 20 mg by mouth daily as needed (pain).     predniSONE (DELTASONE) 5 MG tablet Take 5 mg by mouth at bedtime.     tolterodine (DETROL LA) 2 MG 24 hr capsule Take 2 mg by mouth daily.       Discharge Medications: Please see discharge summary for a list of discharge medications.  Relevant Imaging Results:  Relevant Lab Results:   Additional Information SSN [id number]  Erenest Rasher, RN

## 2020-09-14 NOTE — ED Triage Notes (Signed)
Pt [facility] from home with c/o generalized weakness. Pt reports she has not been out of bed for the past 2 days. No other complaints at this time.   BP-126/68 HR-81 99% room air RR-18 T- 97.9

## 2020-09-14 NOTE — ED Notes (Signed)
Patient refusing vitals

## 2020-09-14 NOTE — Progress Notes (Signed)
.Transition of Care [facility]) - Emergency Department Mini Assessment   Patient Details  Name: [name] MRN: [id number] Date of Birth: Dec 30, 1940  Transition of Care [facility]) CM/SW Contact:    Erenest Rasher, RN Phone Number: [phone] 09/14/2020, 3:34 PM   Clinical Narrative: [facility] CM/CSW spoke to pt and she has been to [facility] in the past. Contacted [facility] and they do have long term care beds. Pt states she has electric wheelchair at home. She lives alone with her dog. Her son lives across the street. Pt gave permission to create FL2 and fax SNF referral. She has been vaccinated and boosted for COVID. She is active with [facility] for [facility]. Waiting PT evaluation.    ED Mini Assessment: What brought you to the Emergency Department? : pain  Barriers to Discharge: Other (must enter comment) (waiting PT evaluation)   Interventions which prevented an admission or readmission: SNF Placement    Patient Contact and Communications   Patient states their goals for this hospitalization and ongoing recovery are:: patient wants nice long term care placement CMS Medicare.gov Compare Post Acute Care list provided to:: Patient Choice offered to / list presented to : Patient  Admission diagnosis:  weakness Patient Active Problem List   Diagnosis Date Noted   GI bleed 08/20/2020   Acute lower GI bleeding 08/19/2020   Acute pulmonary embolism ([place]) 07/21/2020   Hypoxia 05/23/2020   Acute hypoxemic respiratory failure ([place]) 05/22/2020   Rotator cuff arthropathy of left shoulder 12/13/2019   Pain in joint of right shoulder 12/10/2019   Pain in joint of left shoulder 12/10/2019   Hearing loss, sensorineural, 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   Tinnitus, left 01/08/2019   DM type 2, not at goal [facility]) 06/03/2018   Frequent infections 05/21/2018   Gangrene ([place]) 05/21/2018   Pain in finger 05/07/2018   Family history of aneurysm 05/07/2018   Diabetes mellitus due to underlying condition, controlled,  with complication, without long-term current use of insulin ([place])    Lower urinary tract infectious disease    Palpitations 01/17/2018   Dehydration 01/17/2018   Hypotension 01/17/2018   Symptom of blood in stool 01/17/2018   Diarrhea in adult patient 01/17/2018   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   Aftercare following joint replacement 08/21/2017   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 with presence of antibiotic-impregnated cement spacer 06/05/2017   Closed displaced transverse fracture of right patella 05/06/2017   Anemia 04/16/2017   Chronic interstitial lung disease ([place]) 04/16/2017   Chronic kidney disease (CKD), stage III (moderate) ([place]) 04/16/2017   Other hyperlipidemia 04/16/2017   Type 2 diabetes mellitus without complication, without long-term current use of insulin ([place]) 04/16/2017   Pain in right knee 03/25/2017   Aftercare 03/25/2017   Arthritis, septic, knee ([place]) 03/09/2017   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  [place] [id number] Phone: [phone] Fax: [phone]

## 2020-09-14 NOTE — ED Provider Notes (Signed)
[place] DEPT Provider Note   CSN: [id number] Arrival date & time: 09/14/20  2585     History Chief Complaint  Patient presents with   Weakness    [name] is a 80 y.o. female.  80 year old female with prior medical history as detailed below presents for evaluation.  Patient reports generalized weakness and diffuse body aches for last 2 to 3 days.  Patient lives at home by herself.  She reports that she has had significant difficulty getting out of bed over the last 48 hours secondary to her symptoms.  She denies fever.  She denies shortness of breath or chest pain.  The history is provided by the patient.  Weakness Severity:  Severe Onset quality:  Gradual Duration:  48 days Timing:  Constant Progression:  Worsening Chronicity:  New Relieved by:  Nothing Worsened by:  Nothing     Past Medical History:  Diagnosis Date   Acute pulmonary embolism (HCC)    b/l; dx'ed May '22   Asthma    Complication of anesthesia    Anesthesia told her years ago she got too cold during surgery   Diabetes mellitus without complication ([place])    Emphysema    Fibromyalgia    Osteoporosis    Rheumatoid arthritis(714.0)    Sleep apnea    no cpap    Patient Active Problem List   Diagnosis Date Noted   GI bleed 08/20/2020   Acute lower GI bleeding 08/19/2020   Acute pulmonary embolism ([place]) 07/21/2020   Hypoxia 05/23/2020   Acute hypoxemic respiratory failure ([place]) 05/22/2020   Rotator cuff arthropathy of left shoulder 12/13/2019   Pain in joint of right shoulder 12/10/2019   Pain in joint of left shoulder 12/10/2019   Hearing loss, sensorineural, 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   Tinnitus, left 01/08/2019   DM type 2, not at goal [facility]) 06/03/2018   Frequent infections 05/21/2018   Gangrene ([place]) 05/21/2018   Pain in finger 05/07/2018   Family history of aneurysm 05/07/2018   Diabetes mellitus due to underlying condition, controlled, with complication,  without long-term current use of insulin ([place])    Lower urinary tract infectious disease    Palpitations 01/17/2018   Dehydration 01/17/2018   Hypotension 01/17/2018   Symptom of blood in stool 01/17/2018   Diarrhea in adult patient 01/17/2018   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   Aftercare following joint replacement 08/21/2017   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 with presence of antibiotic-impregnated cement spacer 06/05/2017   Closed displaced transverse fracture of right patella 05/06/2017   Anemia 04/16/2017   Chronic interstitial lung disease ([place]) 04/16/2017   Chronic kidney disease (CKD), stage III (moderate) ([place]) 04/16/2017   Other hyperlipidemia 04/16/2017   Type 2 diabetes mellitus without complication, without long-term current use of insulin ([place]) 04/16/2017   Pain in right knee 03/25/2017   Aftercare 03/25/2017   Arthritis, septic, knee ([place]) 03/09/2017   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   Fibromyalgia 06/04/2016   High risk medication use 06/04/2016   Claw toe, acquired, left 01/29/2016   Rheumatoid arthritis involving both feet with negative rheumatoid factor ([name]) 01/29/2016   Septic prepatellar bursitis of right knee 10/26/2014   Prepatellar bursitis of right knee 08/28/2014   DYSPHAGIA ORAL PHASE 06/18/2007   Asthma 05/14/2007   SLEEP APNEA 05/14/2007   Sleep apnea 05/14/2007   GERD 05/13/2007   Rheumatoid arthritis ([place]) 05/13/2007   OSTEOPOROSIS 05/13/2007   DIARRHEA, CHRONIC  05/13/2007    Past Surgical History:  Procedure Laterality Date   ABDOMINAL HYSTERECTOMY     ANKLE FRACTURE SURGERY  2007   BREAST SURGERY     mammoplasty and then removed   COLONOSCOPY WITH PROPOFOL N/A 08/22/2020   Procedure: COLONOSCOPY WITH PROPOFOL;  Surgeon: Ronnette Juniper, MD;  Location: [facility];  Service: Gastroenterology;  Laterality: N/A;  If schedule availability permits, would like to add on upper  endoscopy as well   ESOPHAGOGASTRODUODENOSCOPY (EGD) WITH PROPOFOL N/A 08/22/2020   Procedure: ESOPHAGOGASTRODUODENOSCOPY (EGD) WITH PROPOFOL;  Surgeon: Ronnette Juniper, MD;  Location: [facility];  Service: Gastroenterology;  Laterality: N/A;   FOOT SURGERY     numerous   HOT HEMOSTASIS N/A 08/22/2020   Procedure: HOT HEMOSTASIS (ARGON PLASMA COAGULATION/BICAP);  Surgeon: Ronnette Juniper, MD;  Location: [facility];  Service: Gastroenterology;  Laterality: N/A;   I & D EXTREMITY Right 10/26/2014   Procedure: IRRIGATION AND DEBRIDEMENT RIGHT KNEE ;  Surgeon: Gaynelle Arabian, MD;  Location: [facility];  Service: Orthopedics;  Laterality: Right;   IRRIGATION AND DEBRIDEMENT KNEE Right 03/09/2017   Procedure: IRRIGATION AND DEBRIDEMENT RIGHT KNEE PRE-PATELLAR BURSA;  Surgeon: Susa Day, MD;  Location: [place];  Service: Orthopedics;  Laterality: Right;   KNEE BURSECTOMY Right 09/16/2014   Procedure: EXCISON OF PRE PATELLA BURSA RIGHT KNEE ;  Surgeon: Gaynelle Arabian, MD;  [facility];  Service: Orthopedics;  Laterality: Right;   PARTIAL HYSTERECTOMY  1970   POLYPECTOMY  08/22/2020   Procedure: POLYPECTOMY;  Surgeon: Ronnette Juniper, MD;  [facility];  Service: Gastroenterology;;   TOTAL KNEE ARTHROPLASTY  2005 / 2006   x2     OB History   No obstetric history on file.     Family History  Problem Relation Age of Onset   Hypertension Other    Diabetes Other    Stroke Other    Diabetes Mother    Diabetes Father    Diabetes Sister    Diabetes Maternal Aunt    Cancer Paternal Uncle    Cancer Paternal Grandmother    Breast cancer Paternal Grandmother     Social History   Tobacco Use   Smoking status: Former    Pack years: 0.00    Types: Cigarettes    Quit date: 03/11/1994    Years since quitting: 26.5   Smokeless tobacco: Never  Vaping Use   Vaping Use: Never used  Substance Use Topics   Alcohol use: No   Drug use: No    Home Medications Prior to Admission medications    Medication Sig Start Date End Date Taking? Authorizing Provider  acetaminophen (TYLENOL) 500 MG tablet Take 500-1,000 mg by mouth every 6 (six) hours as needed (for headaches).    [provider]  albuterol (VENTOLIN HFA) 108 (90 Base) MCG/ACT inhaler Inhale 2 puffs into the lungs every 6 (six) hours as needed for shortness of breath or wheezing. 07/05/19   [provider]  apixaban (ELIQUIS) 5 MG TABS tablet Take 1 tablet (5 mg total) by mouth 2 (two) times daily. Start taking only from tomorrow 08/22/20 09/21/20  Shelly Coss, MD  arformoterol (BROVANA) 15 MCG/2ML NEBU Take 15 mcg by nebulization daily.    [provider]  atorvastatin (LIPITOR) 20 MG tablet Take 20 mg by mouth at bedtime.    [provider]  B-D UF III MINI PEN NEEDLES 31G X 5 MM MISC SMARTSIG:1 Each SUB-Q 4 Times Daily 10/22/19   [provider]  budesonide (PULMICORT) 0.25 MG/2ML nebulizer solution Take 2 mLs (0.25 mg total) by nebulization 2 (two) times daily. 05/26/20 06/25/20  [name], MD  Cholecalciferol (VITAMIN D3) 50 MCG (2000 UT) TABS Take 2,000 Units by mouth at bedtime.    [provider]  Cyanocobalamin (B-12) 2500 MCG TABS Take 2,500 mcg by mouth at bedtime.     [provider]  DULoxetine (CYMBALTA) 60 MG capsule Take 60 mg by mouth at bedtime.     [provider]  ezetimibe (ZETIA) 10 MG tablet Take 10 mg by mouth at bedtime.  11/04/16   [provider]  insulin NPH-regular Human (70-30) 100 UNIT/ML injection Inject 14 Units into the skin 2 (two) times daily with a meal.    [provider]  ipratropium-albuterol (DUONEB) 0.5-2.5 (3) MG/3ML SOLN Take 3 mLs by nebulization every 6 (six) hours as needed. Patient not taking: No sig reported 07/19/20   [provider]  loperamide (IMODIUM A-D) 2 MG tablet Take 2 mg by mouth 4 (four) times daily as needed for diarrhea or loose stools.    [provider]  metFORMIN  (GLUCOPHAGE) 500 MG tablet Take 1,000 mg by mouth at bedtime.    [provider]  Nebulizers (COMPRESSOR/NEBULIZER) MISC Nebulizer machine/mask/connector for asthma/emphysema 05/26/20   [name], MD  [facility] VERIO test strip  10/13/18   [provider]  oxyCODONE (OXY IR/ROXICODONE) 5 MG immediate release tablet Take 1 tablet (5 mg total) by mouth every 6 (six) hours as needed for severe pain or moderate pain. 07/26/20   Donne Hazel, MD  pantoprazole (PROTONIX) 40 MG tablet Take one tablet once every morning 05/26/20   [name], MD  predniSONE (DELTASONE) 20 MG tablet Take 20 mg by mouth daily as needed (pain).    [provider]  predniSONE (DELTASONE) 5 MG tablet Take 5 mg by mouth at bedtime.    [provider]  tolterodine (DETROL LA) 2 MG 24 hr capsule Take 2 mg by mouth daily. 08/15/20   [provider]  MYRBETRIQ 50 MG TB24 tablet Take 50 mg by mouth at bedtime. Patient not taking: Reported on 08/19/2020 03/29/20 08/19/20  [provider]    Allergies    Penicillins, Sulfamethoxazole-trimethoprim, Meperidine, Meperidine hcl, Penicillin g sodium, Sulfa antibiotics, and Sulfonamide derivatives  Review of Systems   Review of Systems  Neurological:  Positive for weakness.  All other systems reviewed and are negative.  Physical Exam Updated Vital Signs BP 123/71   Pulse 77   Temp 98.1 F (36.7 C) (Oral)   Resp 18   Ht 5\' 5"  (1.651 m)   Wt 73.1 kg   SpO2 99%   BMI 26.83 kg/m   Physical Exam Vitals and nursing note reviewed.  Constitutional:      General: She is not in acute distress.    Appearance: She is well-developed.  HENT:     Head: Normocephalic and atraumatic.     Mouth/Throat:     Mouth: Mucous membranes are dry.  Eyes:     Conjunctiva/sclera: Conjunctivae normal.     Pupils: Pupils are equal, round, and reactive to light.  Cardiovascular:     Rate and Rhythm: Normal rate and regular rhythm.     Heart sounds:  Normal heart sounds.  Pulmonary:     Effort: Pulmonary effort is normal. No respiratory distress.     Breath sounds: Normal breath sounds.  Abdominal:     General: There is no distension.  Palpations: Abdomen is soft.     Tenderness: There is no abdominal tenderness.  Musculoskeletal:        General: No deformity. Normal range of motion.     Cervical back: Normal range of motion and neck supple.  Skin:    General: Skin is warm and dry.  Neurological:     General: No focal deficit present.     Mental Status: She is alert and oriented to person, place, and time. Mental status is at baseline.     Cranial Nerves: No cranial nerve deficit.     Sensory: No sensory deficit.     Motor: No weakness.     Coordination: Coordination normal.    ED Results / Procedures / Treatments   Labs (all labs ordered are listed, but only abnormal results are displayed) Labs Reviewed  COMPREHENSIVE METABOLIC PANEL - Abnormal; Notable for the following components:      Result Value   Glucose, Bld 163 (*)    Total Protein 6.4 (*)    Albumin 2.7 (*)    AST 14 (*)    All other components within normal limits  CBC WITH DIFFERENTIAL/PLATELET - Abnormal; Notable for the following components:   WBC 13.0 (*)    Hemoglobin 10.3 (*)    HCT 34.8 (*)    MCV 78.7 (*)    MCH 23.3 (*)    MCHC 29.6 (*)    RDW 17.9 (*)    Platelets 487 (*)    Neutro Abs 10.7 (*)    All other components within normal limits  PROTIME-INR - Abnormal; Notable for the following components:   Prothrombin Time 18.1 (*)    INR 1.5 (*)    All other components within normal limits  CK - Abnormal; Notable for the following components:   Total CK 12 (*)    All other components within normal limits  RESP PANEL BY RT-PCR (FLU A&B, COVID) ARPGX2  URINALYSIS, ROUTINE W REFLEX MICROSCOPIC  POC OCCULT BLOOD, ED  TYPE AND SCREEN  TROPONIN I (HIGH SENSITIVITY)  TROPONIN I (HIGH SENSITIVITY)    EKG EKG  Interpretation  Date/Time:  Thursday September 14 2020 09:16:23 EDT Ventricular Rate:  94 PR Interval:  193 QRS Duration: 113 QT Interval:  376 QTC Calculation: 471 R Axis:   -68 Text Interpretation: Sinus tachycardia Multiple premature complexes, vent & supraven Left anterior fascicular block Abnormal R-wave progression, late transition Probable left ventricular hypertrophy ST elevation, consider inferior injury Confirmed by Dene Gentry [phone]) on 09/14/2020 9:17:01 AM  Radiology DG Chest Port 1 View  Result Date: 09/14/2020 CLINICAL DATA:  Dyspnea EXAM: PORTABLE CHEST 1 VIEW COMPARISON:  07/21/2020 FINDINGS: Cardiomegaly. Both lungs are clear. The visualized skeletal structures are unremarkable. IMPRESSION: Cardiomegaly without acute abnormality of the lungs in AP portable projection. Electronically Signed   By: Eddie Candle M.D.   On: 09/14/2020 09:20    Procedures Procedures   Medications Ordered in ED Medications  morphine 4 MG/ML injection 4 mg (4 mg Intravenous Given 09/14/20 0923)  sodium chloride 0.9 % bolus 500 mL (500 mLs Intravenous Bolus 09/14/20 7893)    ED Course  I have reviewed the triage vital signs and the nursing notes.  Pertinent labs & imaging results that were available during my care of the patient were reviewed by me and considered in my medical decision making (see chart for details).    MDM Rules/Calculators/A&P  MDM  MSE complete  [name] was evaluated in Emergency Department on 09/14/2020 for the symptoms described in the history of present illness. She was evaluated in the context of the global COVID-19 pandemic, which necessitated consideration that the patient might be at risk for infection with the SARS-CoV-2 virus that causes COVID-19. Institutional protocols and algorithms that pertain to the evaluation of patients at risk for COVID-19 are in a state of rapid change based on information released by regulatory bodies including  the CDC and federal and state organizations. These policies and algorithms were followed during the patient's care in the ED.  Patient initially presented for evaluation of diffuse myalgias and weakness.  Further evaluation revealed that patient lives at home alone.  She is not appropriate for discharge.  Social work consulted regarding placement.  Patient is in agreement with plan for placement.  Screening labs obtained in the ED are without significant acute medical pathology.  Social work has initiated Secretary/administrator for placement. PT has completed evaluation.  Dr. Vanita Panda aware of pending pharmacy review of home medications (so that home meds can be ordered).  Final Clinical Impression(s) / ED Diagnoses Final diagnoses:  Myalgia  Weakness    Rx / DC Orders ED Discharge Orders     None        Valarie Merino, MD 09/14/20 1644

## 2020-09-14 NOTE — ED Notes (Signed)
Patient refuses to be monitored she wants the BP and pulse ox off. She said she did not agree to stay here. She states her medications were due a hour and a half ago. Pharmacy has verified medications recently and they are due at 2200.

## 2020-09-15 DIAGNOSIS — R531 Weakness: Secondary | ICD-10-CM | POA: Diagnosis not present

## 2020-09-15 NOTE — ED Notes (Signed)
Dinner tray given

## 2020-09-15 NOTE — Progress Notes (Addendum)
10:30am CSW send PT notes to [facility], waiting on review. CSW spoke with pt at bedside, pt reported wanting to make sure she will receive her wheel chair where ever she is placed. Pt reported needing a bedside commode and a private room before she commits to a facility for long term care. Pt also had questions about her insurance and different activities at facility.  CSW informed pt that she can call prospective facility about the different activities they provide. CSW is awaiting on phone call from [facility] facility about availability of bed.  2:40 CSW spoke with pt at bedside, informed CSW provided choice, pt agreed to go to [facility] for Rehab . CSW contact Nikki from [facility] farm to confirm patient's referral. Corey Harold was contacted unknown ETA but she is on the list. TOC sign off.

## 2020-09-15 NOTE — ED Notes (Signed)
Pt was educated about the need to have vitals updated and requested that this writer "turn off that loud machine." Pt is resting comfortably.

## 2020-09-15 NOTE — ED Provider Notes (Signed)
Ms Enrico has been evaluated in the ED with a complicated medical history including recent PE, recent admission for acute lower GI bleed, rheumatoid arthritis, debility and deconditioning who has been evaluated in the emergency department.     The patient is to be admitted to a SNF without the 3 night qualified hospital stay under the current medicare waiver.     Gareth Morgan, MD 09/15/20 [phone]

## 2020-09-15 NOTE — ED Notes (Signed)
Pt given lunch tray.

## 2020-09-15 NOTE — Discharge Instructions (Addendum)
You have been seen and discharged from the emergency department.  Follow-up with your primary provider for reevaluation and further care. Take home medications as prescribed. If you have any worsening symptoms or further concerns for your health please return to an emergency department for further evaluation. 

## 2020-09-15 NOTE — ED Notes (Signed)
PT at bedside.

## 2020-09-15 NOTE — Evaluation (Addendum)
Physical Therapy Evaluation Patient Details Name: [name] MRN: [id number] DOB: 08-Jan-1941 Today's Date: 09/15/2020   History of Present Illness  80 year old female with prior medical history of bilateral PE,COPD, RA,, DM2 presents  generalized weakness and diffuse body aches for last 2 to 3 days, inability to get out of bed.   Patient lives alone, Has HHPT,OT, RN.  Clinical Impression  The patient is eager to mobilize, see how she is doing as she had mobility difficulties PTA.  Patient  uses a power chair, transfers from bed to [facility] or toilet,  has been able to drive but not recently. Orders groceries on line/delivered.  Patient with significant RA deformities in hands and feet. Patient required steady assistance for stand and pivot, using back of a chair for support. Patient has been in [place] recenetly and prefers [facility] if availble vs going home with increased care.  Patient does have HHPT/OT/RN since DC from SNF.  Pt admitted with above diagnosis.  Pt currently with functional limitations due to the deficits listed below (see PT Problem List). Pt will benefit from skilled PT to increase their independence and safety with mobility to allow discharge to the venue listed below.       Follow Up Recommendations SNF    Equipment Recommendations  None recommended by PT    Recommendations for Other Services       Precautions / Restrictions        Mobility  Bed Mobility Overal bed mobility: Needs Assistance Bed Mobility: Supine to Sit;Sit to Supine     Supine to sit: Supervision Sit to supine: Supervision   General bed mobility comments: extra time, decreased control of descent    Transfers Overall transfer level: Needs assistance   Transfers: Sit to/from Stand;Stand Pivot Transfers Sit to Stand: Min assist Stand pivot transfers: Min assist       General transfer comment: small chair placed  at bedside with another chair. Patient stands  and  supports self holding  onto back of chair, slow pivots to turn to  the other chair, steady assistnace for balance. Patient then stood from [employer], holding onto beack of the other chair for suport, assist to rise and steady, small steps back to gurney.  Ambulation/Gait             General Gait Details: non ambulatory PTA  Stairs            Wheelchair Mobility    Modified Rankin (Stroke Patients Only)       Balance Overall balance assessment: Needs assistance Sitting-balance support: Feet supported;No upper extremity supported Sitting balance-Leahy Scale: Fair     Standing balance support: During functional activity;Bilateral upper extremity supported Standing balance-Leahy Scale: Poor                               Pertinent Vitals/Pain Pain Assessment: No/denies pain    Home Living Family/patient expects to be discharged to:: Private residence Living Arrangements: Alone Available Help at Discharge: Family;Available PRN/intermittently Type of Home: Mobile home Home Access: Ramped entrance     Home Layout: One level Home Equipment: Wheelchair - power;Walker - 4 wheels;Shower seat;Bedside commode      Prior Function Level of Independence: Independent with assistive device(s)         Comments: son lives across the street, uses motorized WC,  independent with bathing/dressing, limited  driving and uses motorized cart at grocery store  Hand Dominance   Dominant Hand: Right    Extremity/Trunk Assessment   Upper Extremity Assessment Upper Extremity Assessment:  (severe RA hand deformities)    Lower Extremity Assessment Lower Extremity Assessment:  (RA deformities, 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.)    Cervical / Trunk Assessment Cervical / Trunk Assessment: Kyphotic  Communication   Communication: No difficulties  Cognition Arousal/Alertness: Awake/alert Behavior During Therapy: WFL for tasks assessed/performed Overall Cognitive Status: Within  Functional Limits for tasks assessed                                        General Comments      Exercises     Assessment/Plan    PT Assessment Patient needs continued PT services  PT Problem List Decreased strength;Decreased mobility;Decreased range of motion;Decreased activity tolerance;Decreased balance;Decreased knowledge of use of DME       PT Treatment Interventions DME instruction;Therapeutic activities;Therapeutic exercise;Patient/family education;Functional mobility training    PT Goals (Current goals can be found in the Care Plan section)  Acute Rehab PT Goals Patient Stated Goal: i know i need long term care but I want to go home PT Goal Formulation: With patient Time For Goal Achievement: 09/29/20 Potential to Achieve Goals: Good    Frequency Min 2X/week   Barriers to discharge Decreased caregiver support      Co-evaluation               AM-PAC PT "6 Clicks" Mobility  Outcome Measure Help needed turning from your back to your side while in a flat bed without using bedrails?: A Little Help needed moving from lying on your back to sitting on the side of a flat bed without using bedrails?: A Little Help needed moving to and from a bed to a chair (including a wheelchair)?: A Little Help needed standing up from a chair using your arms (e.g., wheelchair or bedside chair)?: A Little Help needed to walk in hospital room?: A Lot Help needed climbing 3-5 steps with a railing? : Total 6 Click Score: 15    End of Session Equipment Utilized During Treatment: Gait belt Activity Tolerance: Patient tolerated treatment well Patient left: in bed;with call bell/phone within reach Nurse Communication: Mobility status PT Visit Diagnosis: Unsteadiness on feet (R26.81);Muscle weakness (generalized) (M62.81)    Time: 2633-3545 PT Time Calculation (min) (ACUTE ONLY): 30 min   Charges:   PT Evaluation $PT Eval Low Complexity: 1 Low PT  Treatments $Therapeutic Activity: 8-22 mins        Tresa Endo PT Acute Rehabilitation Services Pager [phone] Office [phone]   Claretha Cooper 09/15/2020, 9:38 AM

## 2020-09-16 DIAGNOSIS — D631 Anemia in chronic kidney disease: Secondary | ICD-10-CM | POA: Diagnosis not present

## 2020-09-16 DIAGNOSIS — Z20822 Contact with and (suspected) exposure to covid-19: Secondary | ICD-10-CM | POA: Diagnosis not present

## 2020-09-16 DIAGNOSIS — R531 Weakness: Secondary | ICD-10-CM | POA: Diagnosis not present

## 2020-09-16 DIAGNOSIS — R41841 Cognitive communication deficit: Secondary | ICD-10-CM | POA: Diagnosis not present

## 2020-09-16 DIAGNOSIS — Z794 Long term (current) use of insulin: Secondary | ICD-10-CM | POA: Diagnosis not present

## 2020-09-16 DIAGNOSIS — R5381 Other malaise: Secondary | ICD-10-CM | POA: Diagnosis not present

## 2020-09-16 DIAGNOSIS — M791 Myalgia, unspecified site: Secondary | ICD-10-CM | POA: Diagnosis not present

## 2020-09-16 DIAGNOSIS — M6281 Muscle weakness (generalized): Secondary | ICD-10-CM | POA: Diagnosis not present

## 2020-09-16 DIAGNOSIS — K5790 Diverticulosis of intestine, part unspecified, without perforation or abscess without bleeding: Secondary | ICD-10-CM | POA: Diagnosis not present

## 2020-09-16 DIAGNOSIS — R2681 Unsteadiness on feet: Secondary | ICD-10-CM | POA: Diagnosis not present

## 2020-09-16 DIAGNOSIS — M810 Age-related osteoporosis without current pathological fracture: Secondary | ICD-10-CM | POA: Diagnosis not present

## 2020-09-16 DIAGNOSIS — R278 Other lack of coordination: Secondary | ICD-10-CM | POA: Diagnosis not present

## 2020-09-16 DIAGNOSIS — K219 Gastro-esophageal reflux disease without esophagitis: Secondary | ICD-10-CM | POA: Diagnosis not present

## 2020-09-16 DIAGNOSIS — J449 Chronic obstructive pulmonary disease, unspecified: Secondary | ICD-10-CM | POA: Diagnosis not present

## 2020-09-16 DIAGNOSIS — G4730 Sleep apnea, unspecified: Secondary | ICD-10-CM | POA: Diagnosis not present

## 2020-09-16 DIAGNOSIS — R0902 Hypoxemia: Secondary | ICD-10-CM | POA: Diagnosis not present

## 2020-09-16 DIAGNOSIS — E1122 Type 2 diabetes mellitus with diabetic chronic kidney disease: Secondary | ICD-10-CM | POA: Diagnosis not present

## 2020-09-16 DIAGNOSIS — D638 Anemia in other chronic diseases classified elsewhere: Secondary | ICD-10-CM | POA: Diagnosis not present

## 2020-09-16 DIAGNOSIS — E119 Type 2 diabetes mellitus without complications: Secondary | ICD-10-CM | POA: Diagnosis not present

## 2020-09-16 DIAGNOSIS — M069 Rheumatoid arthritis, unspecified: Secondary | ICD-10-CM | POA: Diagnosis not present

## 2020-09-16 DIAGNOSIS — J45909 Unspecified asthma, uncomplicated: Secondary | ICD-10-CM | POA: Diagnosis not present

## 2020-09-16 DIAGNOSIS — E7849 Other hyperlipidemia: Secondary | ICD-10-CM | POA: Diagnosis not present

## 2020-09-16 DIAGNOSIS — I959 Hypotension, unspecified: Secondary | ICD-10-CM | POA: Diagnosis not present

## 2020-09-16 DIAGNOSIS — R2689 Other abnormalities of gait and mobility: Secondary | ICD-10-CM | POA: Diagnosis not present

## 2020-09-16 DIAGNOSIS — Z8719 Personal history of other diseases of the digestive system: Secondary | ICD-10-CM | POA: Diagnosis not present

## 2020-09-16 DIAGNOSIS — Z743 Need for continuous supervision: Secondary | ICD-10-CM | POA: Diagnosis not present

## 2020-09-16 DIAGNOSIS — F418 Other specified anxiety disorders: Secondary | ICD-10-CM | POA: Diagnosis not present

## 2020-09-16 NOTE — ED Notes (Signed)
Staff provided personal hygiene for pt and changed brief. purewick removed per pt request.

## 2020-09-18 ENCOUNTER — Other Ambulatory Visit: Payer: Self-pay | Admitting: *Deleted

## 2020-09-18 DIAGNOSIS — R5381 Other malaise: Secondary | ICD-10-CM | POA: Diagnosis not present

## 2020-09-18 DIAGNOSIS — E119 Type 2 diabetes mellitus without complications: Secondary | ICD-10-CM | POA: Diagnosis not present

## 2020-09-18 DIAGNOSIS — R531 Weakness: Secondary | ICD-10-CM | POA: Diagnosis not present

## 2020-09-18 DIAGNOSIS — Z794 Long term (current) use of insulin: Secondary | ICD-10-CM | POA: Diagnosis not present

## 2020-09-18 NOTE — Patient Outreach (Signed)
Member screened for potential [facility] Care Management needs.  Made aware by [place] that member is admitted to [facility].   Ms. Dias was active with [facility] RNCM prior. Update received from [place] (central) indicating member lived alone prior and had limited support. Reports member could benefit from alternative transition plans.  Communication sent to [facility] SW to inquire about transition plans and to make aware member was active with [place] Management prior. Passed along that [place] was recommending alternative plans such as long term care.   Will continue to follow while member resides in SNF. Will re-refer upon SNF transition to [place] if needed.    Marthenia Rolling, MSN, RN,BSN [place] Acute Care Coordinator [phone] [facility]) [phone]  (Toll free office)

## 2020-09-21 ENCOUNTER — Other Ambulatory Visit: Payer: Self-pay | Admitting: *Deleted

## 2020-09-21 NOTE — Patient Outreach (Signed)
[facility] Post- Acute Care Coordinator follow up. Ms. Goldsmith resides in [facility].   Update received from [place] who reports member's goal is for long term care.   Will update [facility] Care Management RNCM (central). Will continue to follow for any transition plan changes.     Marthenia Rolling, MSN, RN,BSN [place] Acute Care Coordinator [phone] [facility]) [phone]  (Toll free office)

## 2020-09-27 DIAGNOSIS — F418 Other specified anxiety disorders: Secondary | ICD-10-CM | POA: Diagnosis not present

## 2020-09-28 DIAGNOSIS — Z8719 Personal history of other diseases of the digestive system: Secondary | ICD-10-CM | POA: Diagnosis not present

## 2020-09-28 DIAGNOSIS — R531 Weakness: Secondary | ICD-10-CM | POA: Diagnosis not present

## 2020-09-28 DIAGNOSIS — M069 Rheumatoid arthritis, unspecified: Secondary | ICD-10-CM | POA: Diagnosis not present

## 2020-09-28 DIAGNOSIS — R5381 Other malaise: Secondary | ICD-10-CM | POA: Diagnosis not present

## 2020-10-02 ENCOUNTER — Other Ambulatory Visit: Payer: Self-pay | Admitting: *Deleted

## 2020-10-02 NOTE — Patient Outreach (Signed)
[facility]) Care Management  10/02/2020  STEPHIE FLINK 02/18/1941 [id number]   Case Closure  Pt currently at SNF level of care >10 days with plans to transition to LTC via [facility] liaison Marthenia Rolling, RN.   Will close this case and send letter to provider of pt's disposition with [facility].  [name], RN Care Management Coordinator [place] Office [phone]

## 2020-10-04 DIAGNOSIS — J449 Chronic obstructive pulmonary disease, unspecified: Secondary | ICD-10-CM | POA: Diagnosis not present

## 2020-10-04 DIAGNOSIS — R5381 Other malaise: Secondary | ICD-10-CM | POA: Diagnosis not present

## 2020-10-04 DIAGNOSIS — Z794 Long term (current) use of insulin: Secondary | ICD-10-CM | POA: Diagnosis not present

## 2020-10-04 DIAGNOSIS — E119 Type 2 diabetes mellitus without complications: Secondary | ICD-10-CM | POA: Diagnosis not present

## 2020-10-09 ENCOUNTER — Other Ambulatory Visit: Payer: Self-pay | Admitting: *Deleted

## 2020-10-09 DIAGNOSIS — I2699 Other pulmonary embolism without acute cor pulmonale: Secondary | ICD-10-CM | POA: Diagnosis not present

## 2020-10-09 DIAGNOSIS — E1151 Type 2 diabetes mellitus with diabetic peripheral angiopathy without gangrene: Secondary | ICD-10-CM | POA: Diagnosis not present

## 2020-10-09 DIAGNOSIS — M810 Age-related osteoporosis without current pathological fracture: Secondary | ICD-10-CM | POA: Diagnosis not present

## 2020-10-09 DIAGNOSIS — D692 Other nonthrombocytopenic purpura: Secondary | ICD-10-CM | POA: Diagnosis not present

## 2020-10-09 DIAGNOSIS — N1831 Chronic kidney disease, stage 3a: Secondary | ICD-10-CM | POA: Diagnosis not present

## 2020-10-09 DIAGNOSIS — E785 Hyperlipidemia, unspecified: Secondary | ICD-10-CM | POA: Diagnosis not present

## 2020-10-09 DIAGNOSIS — M069 Rheumatoid arthritis, unspecified: Secondary | ICD-10-CM | POA: Diagnosis not present

## 2020-10-09 DIAGNOSIS — D801 Nonfamilial hypogammaglobulinemia: Secondary | ICD-10-CM | POA: Diagnosis not present

## 2020-10-09 DIAGNOSIS — J45909 Unspecified asthma, uncomplicated: Secondary | ICD-10-CM | POA: Diagnosis not present

## 2020-10-09 DIAGNOSIS — F329 Major depressive disorder, single episode, unspecified: Secondary | ICD-10-CM | POA: Diagnosis not present

## 2020-10-09 NOTE — Addendum Note (Signed)
Addended by: Harless Litten on: 10/09/2020 02:27 PM   Modules accepted: Orders

## 2020-10-09 NOTE — Patient Outreach (Addendum)
[facility] Post- Acute Care Coordinator follow up.   Update received from [name] indicating member decided not to remain LTC and return home instead. [facility] home health was arranged.  Telephone call made to [name] to discuss [place] Management re-engagement. No answer. HIPAA compliant voicemail message left to request return call.   Addendum 1403 pm:  Spoke with [name] via phone at [phone]. States she had virtual appointment with PCP today. States she has 2 ladies there today that are making meals for her. States she will have assistance once a week. [name] states she wanted ALF not LTC. States she was unable to afford ALF. States [facility] has made contact. Discussed [facility] Care Management re-engagement. [name] is agreeable.   Will re-refer to [place] Management RNCM (central) for care coordination. [name] states her home phone is best the best contact number [phone].   Leslie Rolling, MSN, RN,BSN [place] Acute Care Coordinator [phone] [facility]) [phone]  (Toll free office)

## 2020-10-10 ENCOUNTER — Emergency Department (HOSPITAL_COMMUNITY)
Admission: EM | Admit: 2020-10-10 | Discharge: 2020-10-11 | Disposition: A | Discharge status: Home / Self Care | Payer: Medicare Other | Attending: Emergency Medicine | Admitting: Emergency Medicine

## 2020-10-10 ENCOUNTER — Encounter (HOSPITAL_COMMUNITY): Payer: Self-pay

## 2020-10-10 ENCOUNTER — Emergency Department (HOSPITAL_COMMUNITY): Payer: Medicare Other

## 2020-10-10 ENCOUNTER — Other Ambulatory Visit: Payer: Self-pay

## 2020-10-10 DIAGNOSIS — E1122 Type 2 diabetes mellitus with diabetic chronic kidney disease: Secondary | ICD-10-CM | POA: Diagnosis not present

## 2020-10-10 DIAGNOSIS — R0789 Other chest pain: Secondary | ICD-10-CM | POA: Diagnosis not present

## 2020-10-10 DIAGNOSIS — D649 Anemia, unspecified: Secondary | ICD-10-CM | POA: Diagnosis not present

## 2020-10-10 DIAGNOSIS — M06072 Rheumatoid arthritis without rheumatoid factor, left ankle and foot: Secondary | ICD-10-CM | POA: Diagnosis not present

## 2020-10-10 DIAGNOSIS — Z7901 Long term (current) use of anticoagulants: Secondary | ICD-10-CM | POA: Diagnosis not present

## 2020-10-10 DIAGNOSIS — Z87891 Personal history of nicotine dependence: Secondary | ICD-10-CM | POA: Diagnosis not present

## 2020-10-10 DIAGNOSIS — Z794 Long term (current) use of insulin: Secondary | ICD-10-CM | POA: Diagnosis not present

## 2020-10-10 DIAGNOSIS — R079 Chest pain, unspecified: Secondary | ICD-10-CM | POA: Insufficient documentation

## 2020-10-10 DIAGNOSIS — R0602 Shortness of breath: Secondary | ICD-10-CM | POA: Diagnosis not present

## 2020-10-10 DIAGNOSIS — I491 Atrial premature depolarization: Secondary | ICD-10-CM | POA: Diagnosis not present

## 2020-10-10 DIAGNOSIS — R000 Tachycardia, unspecified: Secondary | ICD-10-CM | POA: Insufficient documentation

## 2020-10-10 DIAGNOSIS — J45909 Unspecified asthma, uncomplicated: Secondary | ICD-10-CM | POA: Insufficient documentation

## 2020-10-10 DIAGNOSIS — G894 Chronic pain syndrome: Secondary | ICD-10-CM | POA: Diagnosis not present

## 2020-10-10 DIAGNOSIS — Z96659 Presence of unspecified artificial knee joint: Secondary | ICD-10-CM | POA: Insufficient documentation

## 2020-10-10 DIAGNOSIS — R109 Unspecified abdominal pain: Secondary | ICD-10-CM | POA: Diagnosis not present

## 2020-10-10 DIAGNOSIS — E11618 Type 2 diabetes mellitus with other diabetic arthropathy: Secondary | ICD-10-CM | POA: Diagnosis not present

## 2020-10-10 DIAGNOSIS — J439 Emphysema, unspecified: Secondary | ICD-10-CM | POA: Diagnosis not present

## 2020-10-10 DIAGNOSIS — Z7984 Long term (current) use of oral hypoglycemic drugs: Secondary | ICD-10-CM | POA: Insufficient documentation

## 2020-10-10 DIAGNOSIS — N289 Disorder of kidney and ureter, unspecified: Secondary | ICD-10-CM | POA: Diagnosis not present

## 2020-10-10 DIAGNOSIS — E860 Dehydration: Secondary | ICD-10-CM

## 2020-10-10 DIAGNOSIS — J90 Pleural effusion, not elsewhere classified: Secondary | ICD-10-CM | POA: Diagnosis not present

## 2020-10-10 DIAGNOSIS — J9811 Atelectasis: Secondary | ICD-10-CM | POA: Diagnosis not present

## 2020-10-10 DIAGNOSIS — N179 Acute kidney failure, unspecified: Secondary | ICD-10-CM

## 2020-10-10 DIAGNOSIS — N183 Chronic kidney disease, stage 3 unspecified: Secondary | ICD-10-CM | POA: Insufficient documentation

## 2020-10-10 DIAGNOSIS — I2699 Other pulmonary embolism without acute cor pulmonale: Secondary | ICD-10-CM | POA: Diagnosis not present

## 2020-10-10 DIAGNOSIS — M06071 Rheumatoid arthritis without rheumatoid factor, right ankle and foot: Secondary | ICD-10-CM | POA: Diagnosis not present

## 2020-10-10 LAB — HEPATIC FUNCTION PANEL
ALT: 16 U/L (ref 0–44)
AST: 21 U/L (ref 15–41)
Albumin: 2.6 g/dL — ABNORMAL LOW (ref 3.5–5.0)
Alkaline Phosphatase: 73 U/L (ref 38–126)
Bilirubin, Direct: 0.2 mg/dL (ref 0.0–0.2)
Indirect Bilirubin: 0.4 mg/dL (ref 0.3–0.9)
Total Bilirubin: 0.6 mg/dL (ref 0.3–1.2)
Total Protein: 5.6 g/dL — ABNORMAL LOW (ref 6.5–8.1)

## 2020-10-10 LAB — CBC WITH DIFFERENTIAL/PLATELET
Abs Immature Granulocytes: 0.15 10*3/uL — ABNORMAL HIGH (ref 0.00–0.07)
Basophils Absolute: 0.1 10*3/uL (ref 0.0–0.1)
Basophils Relative: 1 %
Eosinophils Absolute: 0.5 10*3/uL (ref 0.0–0.5)
Eosinophils Relative: 4 %
HCT: 28.8 % — ABNORMAL LOW (ref 36.0–46.0)
Hemoglobin: 8.6 g/dL — ABNORMAL LOW (ref 12.0–15.0)
Immature Granulocytes: 1 %
Lymphocytes Relative: 18 %
Lymphs Abs: 2.5 10*3/uL (ref 0.7–4.0)
MCH: 23.4 pg — ABNORMAL LOW (ref 26.0–34.0)
MCHC: 29.9 g/dL — ABNORMAL LOW (ref 30.0–36.0)
MCV: 78.3 fL — ABNORMAL LOW (ref 80.0–100.0)
Monocytes Absolute: 0.9 10*3/uL (ref 0.1–1.0)
Monocytes Relative: 7 %
Neutro Abs: 10 10*3/uL — ABNORMAL HIGH (ref 1.7–7.7)
Neutrophils Relative %: 69 %
Platelets: 483 10*3/uL — ABNORMAL HIGH (ref 150–400)
RBC: 3.68 MIL/uL — ABNORMAL LOW (ref 3.87–5.11)
RDW: 17.8 % — ABNORMAL HIGH (ref 11.5–15.5)
WBC: 14.1 10*3/uL — ABNORMAL HIGH (ref 4.0–10.5)
nRBC: 0 % (ref 0.0–0.2)

## 2020-10-10 LAB — TROPONIN I (HIGH SENSITIVITY)
Troponin I (High Sensitivity): 8 ng/L (ref <18)
Troponin I (High Sensitivity): 8 ng/L (ref <18)

## 2020-10-10 LAB — URINALYSIS, ROUTINE W REFLEX MICROSCOPIC
Bilirubin Urine: NEGATIVE
Glucose, UA: NEGATIVE mg/dL
Hgb urine dipstick: NEGATIVE
Ketones, ur: NEGATIVE mg/dL
Leukocytes,Ua: NEGATIVE
Nitrite: NEGATIVE
Protein, ur: NEGATIVE mg/dL
Specific Gravity, Urine: 1.017 (ref 1.005–1.030)
pH: 5 (ref 5.0–8.0)

## 2020-10-10 LAB — BASIC METABOLIC PANEL
Anion gap: 11 (ref 5–15)
BUN: 28 mg/dL — ABNORMAL HIGH (ref 8–23)
CO2: 23 mmol/L (ref 22–32)
Calcium: 8.9 mg/dL (ref 8.9–10.3)
Chloride: 103 mmol/L (ref 98–111)
Creatinine, Ser: 1.48 mg/dL — ABNORMAL HIGH (ref 0.44–1.00)
GFR, Estimated: 36 mL/min — ABNORMAL LOW (ref >60)
Glucose, Bld: 97 mg/dL (ref 70–99)
Potassium: 4.3 mmol/L (ref 3.5–5.1)
Sodium: 137 mmol/L (ref 135–145)

## 2020-10-10 LAB — LIPASE, BLOOD: Lipase: 28 U/L (ref 11–51)

## 2020-10-10 MED ORDER — SODIUM CHLORIDE 0.9 % IV BOLUS
1000.0000 mL | Freq: Once | INTRAVENOUS | Status: AC
  Administered 2020-10-10: 1000 mL via INTRAVENOUS

## 2020-10-10 MED ORDER — IOHEXOL 350 MG/ML SOLN
80.0000 mL | Freq: Once | INTRAVENOUS | Status: AC | PRN
  Administered 2020-10-10: 80 mL via INTRAVENOUS

## 2020-10-10 NOTE — Discharge Instructions (Addendum)
The CAT scan today showed that all the blood clots were gone.  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  Also today you continue to be anemic and your blood counts have dropped a little bit.  Normally they are 9 and today they were 8.  Also your kidney function is up from what it normally is and is 1.48.  It would be important for you to drink plenty of fluids and you need to follow-up with Dr. Joylene Draft for recheck of these labs.  You are occasionally throwing extra beats but otherwise your EKG looks normal today and your heart markers are normal without any signs of heart attack.  You do appear to be safe for discharge however after you get home if you start having worsening symptoms over the next few days start getting constant shortness of breath, constant chest pain, vomiting, fever or other concerns please return to the emergency room.

## 2020-10-10 NOTE — ED Provider Notes (Signed)
[facility] EMERGENCY DEPARTMENT Provider Note   CSN: [id number] Arrival date & time: 10/10/20  1746     History Chief Complaint  Patient presents with   Chest Pain   Shortness of Breath    [name] is a 80 y.o. female with a past medical history of fibromyalgia, PE diagnosed 3 months ago currently on Eliquis presenting to the ED for chest pain and shortness of breath.  Patient had a new caretaker at home today.  She noticed some labored breathing and noticed that her heart rate was in the 120s.  He was complaining of slight chest pain at that time as well.  Caretaker got worried and called 911.  She states that she has been in the ER his symptoms have resolved.  Denies any chest pain, shortness of breath currently.  Has been compliant with her Eliquis.  No abdominal pain, vomiting, fever, cough or sick contacts with similar symptoms.  She is only complaining of pain to her bilateral kidney area.  No dysuria.  HPI     Past Medical History:  Diagnosis Date   Acute pulmonary embolism ([place])    b/l; dx'ed May '22   Asthma    Complication of anesthesia    Anesthesia told her years ago she got too cold during surgery   Diabetes mellitus without complication ([place])    Emphysema    Fibromyalgia    Osteoporosis    Rheumatoid arthritis(714.0)    Sleep apnea    no cpap    Patient Active Problem List   Diagnosis Date Noted   GI bleed 08/20/2020   Acute lower GI bleeding 08/19/2020   Acute pulmonary embolism ([place]) 07/21/2020   Hypoxia 05/23/2020   Acute hypoxemic respiratory failure ([place]) 05/22/2020   Rotator cuff arthropathy of left shoulder 12/13/2019   Pain in joint of right shoulder 12/10/2019   Pain in joint of left shoulder 12/10/2019   Hearing loss, sensorineural, 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   Tinnitus, left 01/08/2019   DM type 2, not at goal [facility]) 06/03/2018   Frequent infections 05/21/2018   Gangrene ([place]) 05/21/2018   Pain in finger 05/07/2018    Family history of aneurysm 05/07/2018   Diabetes mellitus due to underlying condition, controlled, with complication, without long-term current use of insulin ([place])    Lower urinary tract infectious disease    Palpitations 01/17/2018   Dehydration 01/17/2018   Hypotension 01/17/2018   Symptom of blood in stool 01/17/2018   Diarrhea in adult patient 01/17/2018   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   Aftercare following joint replacement 08/21/2017   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 with presence of antibiotic-impregnated cement spacer 06/05/2017   Closed displaced transverse fracture of right patella 05/06/2017   Anemia 04/16/2017   Chronic interstitial lung disease ([place]) 04/16/2017   Chronic kidney disease (CKD), stage III (moderate) ([place]) 04/16/2017   Other hyperlipidemia 04/16/2017   Type 2 diabetes mellitus without complication, without long-term current use of insulin ([place]) 04/16/2017   Pain in right knee 03/25/2017   Aftercare 03/25/2017   Arthritis, septic, knee ([place]) 03/09/2017   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   Fibromyalgia 06/04/2016   High risk medication use 06/04/2016   Claw toe, acquired, left 01/29/2016   Rheumatoid arthritis involving both feet with negative rheumatoid factor ([place]) 01/29/2016   Septic prepatellar bursitis of right knee 10/26/2014   Prepatellar bursitis of right knee 08/28/2014   DYSPHAGIA ORAL PHASE 06/18/2007  Asthma 05/14/2007   SLEEP APNEA 05/14/2007   Sleep apnea 05/14/2007   GERD 05/13/2007   Rheumatoid arthritis ([place]) 05/13/2007   OSTEOPOROSIS 05/13/2007   DIARRHEA, CHRONIC 05/13/2007    Past Surgical History:  Procedure Laterality Date   ABDOMINAL HYSTERECTOMY     ANKLE FRACTURE SURGERY  2007   BREAST SURGERY     mammoplasty and then removed   COLONOSCOPY WITH PROPOFOL N/A 08/22/2020   Procedure: COLONOSCOPY WITH PROPOFOL;  Surgeon: Ronnette Juniper, MD;  Location: [facility]  ENDOSCOPY;  Service: Gastroenterology;  Laterality: N/A;  If schedule availability permits, would like to add on upper endoscopy as well   ESOPHAGOGASTRODUODENOSCOPY (EGD) WITH PROPOFOL N/A 08/22/2020   Procedure: ESOPHAGOGASTRODUODENOSCOPY (EGD) WITH PROPOFOL;  Surgeon: Ronnette Juniper, MD;  Location: [facility] ENDOSCOPY;  Service: Gastroenterology;  Laterality: N/A;   FOOT SURGERY     numerous   HOT HEMOSTASIS N/A 08/22/2020   Procedure: HOT HEMOSTASIS (ARGON PLASMA COAGULATION/BICAP);  Surgeon: Ronnette Juniper, MD;  Location: [facility];  Service: Gastroenterology;  Laterality: N/A;   I & D EXTREMITY Right 10/26/2014   Procedure: IRRIGATION AND DEBRIDEMENT RIGHT KNEE ;  Surgeon: Gaynelle Arabian, MD;  Location: [facility] ORS;  Service: Orthopedics;  Laterality: Right;   IRRIGATION AND DEBRIDEMENT KNEE Right 03/09/2017   Procedure: IRRIGATION AND DEBRIDEMENT RIGHT KNEE PRE-PATELLAR BURSA;  Surgeon: Susa Day, MD;  Location: [facility];  Service: Orthopedics;  Laterality: Right;   KNEE BURSECTOMY Right 09/16/2014   Procedure: EXCISON OF PRE PATELLA BURSA RIGHT KNEE ;  Surgeon: Gaynelle Arabian, MD;  Location: [facility] ORS;  Service: Orthopedics;  Laterality: Right;   PARTIAL HYSTERECTOMY  1970   POLYPECTOMY  08/22/2020   Procedure: POLYPECTOMY;  Surgeon: Ronnette Juniper, MD;  Location: [facility] ENDOS[facility];  Service: Gastroenterology;;   TOTAL KNEE ARTHROPLASTY  2005 / 2006   x2     OB History   No obstetric history on file.     Family History  Problem Relation Age of Onset   Hypertension Other    Diabetes Other    Stroke Other    Diabetes Mother    Diabetes Father    Diabetes Sister    Diabetes Maternal Aunt    Cancer Paternal Uncle    Cancer Paternal Grandmother    Breast cancer Paternal Grandmother     Social History   Tobacco Use   Smoking status: Former    Types: Cigarettes    Quit date: 03/11/1994    Years since quitting: 26.6   Smokeless tobacco: Never  Vaping Use   Vaping Use: Never used  Substance Use Topics    Alcohol use: No   Drug use: No    Home Medications Prior to Admission medications   Medication Sig Start Date End Date Taking? Authorizing Provider  acetaminophen (TYLENOL) 500 MG tablet Take 1,000 mg by mouth every 6 (six) hours as needed (for headaches).    [provider]  albuterol (VENTOLIN HFA) 108 (90 Base) MCG/ACT inhaler Inhale 2 puffs into the lungs every 6 (six) hours as needed for shortness of breath or wheezing. 07/05/19   [provider]  apixaban (ELIQUIS) 5 MG TABS tablet Take 1 tablet (5 mg total) by mouth 2 (two) times daily. Start taking only from tomorrow 08/22/20 09/21/20  Shelly Coss, MD  arformoterol (BROVANA) 15 MCG/2ML NEBU Take 15 mcg by nebulization daily.    [provider]  atorvastatin (LIPITOR) 20 MG tablet Take 20 mg by mouth at bedtime.    [provider]  B-D UF III MINI PEN NEEDLES 31G X 5 MM MISC SMARTSIG:1 Each SUB-Q 4 Times Daily 10/22/19   [provider]  budesonide (PULMICORT) 0.25 MG/2ML nebulizer solution Take 2 mLs (0.25 mg total) by nebulization 2 (two) times daily. 05/26/20 09/14/20  [name], MD  Cholecalciferol (VITAMIN D3) 50 MCG (2000 UT) TABS Take 2,000 Units by mouth at bedtime.    [provider]  Cyanocobalamin (B-12) 2500 MCG TABS Take 2,500 mcg by mouth at bedtime.     [provider]  DULoxetine (CYMBALTA) 60 MG capsule Take 60 mg by mouth at bedtime.     [provider]  ezetimibe (ZETIA) 10 MG tablet Take 10 mg by mouth at bedtime.  11/04/16   [provider]  insulin NPH-regular Human (70-30) 100 UNIT/ML injection Inject 14 Units into the skin 2 (two) times daily with a meal.    [provider]  ipratropium-albuterol (DUONEB) 0.5-2.5 (3) MG/3ML SOLN Take 3 mLs by nebulization every 6 (six) hours as needed (wheezing). 07/19/20   [provider]  loperamide (IMODIUM A-D) 2 MG tablet Take 2 mg by mouth 4 (four) times daily as needed for  diarrhea or loose stools.    [provider]  metFORMIN (GLUCOPHAGE) 500 MG tablet Take 1,000 mg by mouth at bedtime.    [provider]  Nebulizers (COMPRESSOR/NEBULIZER) MISC Nebulizer machine/mask/connector for asthma/emphysema 05/26/20   [name], MD  [facility] VERIO test strip  10/13/18   [provider]  oxyCODONE (OXY IR/ROXICODONE) 5 MG immediate release tablet Take 1 tablet (5 mg total) by mouth every 6 (six) hours as needed for severe pain or moderate pain. Patient taking differently: Take 10 mg by mouth daily as needed for severe pain or moderate pain. 07/26/20   Donne Hazel, MD  pantoprazole (PROTONIX) 40 MG tablet Take one tablet once every morning Patient taking differently: Take 40 mg by mouth daily. 05/26/20   [name], MD  predniSONE (DELTASONE) 20 MG tablet Take 20 mg by mouth daily as needed (pain).    [provider]  predniSONE (DELTASONE) 5 MG tablet Take 5 mg by mouth at bedtime.    [provider]  tolterodine (DETROL LA) 2 MG 24 hr capsule Take 2 mg by mouth daily. 08/15/20   [provider]  MYRBETRIQ 50 MG TB24 tablet Take 50 mg by mouth at bedtime. Patient not taking: Reported on 08/19/2020 03/29/20 08/19/20  [provider]    Allergies    Penicillins, Sulfamethoxazole-trimethoprim, Meperidine, Meperidine hcl, Penicillin g sodium, Sulfa antibiotics, and Sulfonamide derivatives  Review of Systems   Review of Systems  Constitutional:  Negative for appetite change, chills and fever.  Eyes:  Negative for photophobia and visual disturbance.  Cardiovascular:  Positive for chest pain. Negative for palpitations.  Gastrointestinal:  Negative for abdominal pain, blood in stool, constipation, diarrhea, nausea and vomiting.  Genitourinary:  Negative for dysuria, hematuria and urgency.  Musculoskeletal:  Positive for back pain. Negative for myalgias.  Skin:  Negative for rash.  Neurological:  Negative for  dizziness, weakness and light-headedness.   Physical Exam Updated Vital Signs BP 96/65   Pulse (!) 107   Temp 98.3 F (36.8 C) (Oral)   Resp 19   SpO2 99%   Physical Exam Vitals and nursing note reviewed.  Constitutional:      General: She is not in acute distress.    Appearance: She is well-developed.  HENT:     Head: Normocephalic and  atraumatic.     Nose: Nose normal.  Eyes:     General: No scleral icterus.       Left eye: No discharge.     Conjunctiva/sclera: Conjunctivae normal.  Cardiovascular:     Rate and Rhythm: Regular rhythm. Tachycardia present.     Heart sounds: Normal heart sounds. No murmur heard.   No friction rub. No gallop.  Pulmonary:     Effort: Pulmonary effort is normal. No respiratory distress.     Breath sounds: Normal breath sounds.  Abdominal:     General: Bowel sounds are normal. There is no distension.     Palpations: Abdomen is soft.     Tenderness: There is no abdominal tenderness. There is no guarding.  Musculoskeletal:        General: Normal range of motion.     Cervical back: Normal range of motion and neck supple.     Comments: Bilateral paraspinal musculature tenderness to the lumbar spine.  No midline tenderness.  Skin:    General: Skin is warm and dry.     Findings: No rash.  Neurological:     Mental Status: She is alert.     Motor: No abnormal muscle tone.     Coordination: Coordination normal.    ED Results / Procedures / Treatments   Labs (all labs ordered are listed, but only abnormal results are displayed) Labs Reviewed  BASIC METABOLIC PANEL  CBC WITH DIFFERENTIAL/PLATELET  URINALYSIS, ROUTINE W REFLEX MICROSCOPIC  TROPONIN I (HIGH SENSITIVITY)    EKG None  Radiology No results found.  Procedures Procedures   Medications Ordered in ED Medications - No data to display  ED Course  I have reviewed the triage vital signs and the nursing notes.  Pertinent labs & imaging results that were available during my  care of the patient were reviewed by me and considered in my medical decision making (see chart for details).    MDM Rules/Calculators/A&P                           80 year old female with a past medical history of PEs currently on Eliquis presenting to the ED for chest pain and shortness of breath.  Had a new caretaker in her home today and was concerned that her labored breathing.  She was found to be tachycardic and complained of chest pain shortness of breath at the time.  Her symptoms have resolved since arrival to the ER.  Was diagnosed with PE a few months ago has been compliant with her  Anticoagulant.  Her only complaint today is pain to bilateral kidney area but denies any dysuria.  On exam there are some tenderness in this area.  No abdominal tenderness.  No leg swelling bilaterally.  She is tachycardic here.  Oxygen saturations above 95% on room air.  Will obtain lab work, EKG, chest x-ray and reassess. Care handed off to oncoming provider pending workup and disposition.    Portions of this note were generated with [employer]ist. Dictation errors may occur despite best attempts at proofreading.  Final Clinical Impression(s) / ED Diagnoses Final diagnoses:  None    Rx / DC Orders ED Discharge Orders     None        Delia Heady, PA-C 10/10/20 1856    Blanchie Dessert, MD 10/10/20 2311

## 2020-10-10 NOTE — ED Notes (Signed)
Purewick applied and patient urinated.

## 2020-10-10 NOTE — ED Triage Notes (Signed)
Pt arrives via EMS from home. She states her home health nurse noticed her HR was 120 earlier. Pt reports she had a brief episode of SOB and CP at that time. Currently on Eliquis for PE

## 2020-10-10 NOTE — ED Notes (Signed)
Called [facility] to transport patient to home address. Patient uses motorized scooter to get around, lives alone. Per [facility] it will be a while, they have two other out of town patients before patient.

## 2020-10-11 ENCOUNTER — Other Ambulatory Visit: Payer: Self-pay | Admitting: *Deleted

## 2020-10-11 DIAGNOSIS — G4733 Obstructive sleep apnea (adult) (pediatric): Secondary | ICD-10-CM | POA: Diagnosis not present

## 2020-10-11 DIAGNOSIS — D631 Anemia in chronic kidney disease: Secondary | ICD-10-CM | POA: Diagnosis not present

## 2020-10-11 DIAGNOSIS — M797 Fibromyalgia: Secondary | ICD-10-CM | POA: Diagnosis not present

## 2020-10-11 DIAGNOSIS — Z794 Long term (current) use of insulin: Secondary | ICD-10-CM | POA: Diagnosis not present

## 2020-10-11 DIAGNOSIS — J45909 Unspecified asthma, uncomplicated: Secondary | ICD-10-CM | POA: Diagnosis not present

## 2020-10-11 DIAGNOSIS — Z87891 Personal history of nicotine dependence: Secondary | ICD-10-CM | POA: Diagnosis not present

## 2020-10-11 DIAGNOSIS — M06072 Rheumatoid arthritis without rheumatoid factor, left ankle and foot: Secondary | ICD-10-CM | POA: Diagnosis not present

## 2020-10-11 DIAGNOSIS — D801 Nonfamilial hypogammaglobulinemia: Secondary | ICD-10-CM | POA: Diagnosis not present

## 2020-10-11 DIAGNOSIS — J439 Emphysema, unspecified: Secondary | ICD-10-CM | POA: Diagnosis not present

## 2020-10-11 DIAGNOSIS — Z7984 Long term (current) use of oral hypoglycemic drugs: Secondary | ICD-10-CM | POA: Diagnosis not present

## 2020-10-11 DIAGNOSIS — I9589 Other hypotension: Secondary | ICD-10-CM | POA: Diagnosis not present

## 2020-10-11 DIAGNOSIS — M06071 Rheumatoid arthritis without rheumatoid factor, right ankle and foot: Secondary | ICD-10-CM | POA: Diagnosis not present

## 2020-10-11 DIAGNOSIS — H903 Sensorineural hearing loss, bilateral: Secondary | ICD-10-CM | POA: Diagnosis not present

## 2020-10-11 DIAGNOSIS — E1122 Type 2 diabetes mellitus with diabetic chronic kidney disease: Secondary | ICD-10-CM | POA: Diagnosis not present

## 2020-10-11 DIAGNOSIS — N1831 Chronic kidney disease, stage 3a: Secondary | ICD-10-CM | POA: Diagnosis not present

## 2020-10-11 DIAGNOSIS — Q2733 Arteriovenous malformation of digestive system vessel: Secondary | ICD-10-CM | POA: Diagnosis not present

## 2020-10-11 DIAGNOSIS — M1990 Unspecified osteoarthritis, unspecified site: Secondary | ICD-10-CM | POA: Diagnosis not present

## 2020-10-11 DIAGNOSIS — M810 Age-related osteoporosis without current pathological fracture: Secondary | ICD-10-CM | POA: Diagnosis not present

## 2020-10-11 DIAGNOSIS — Z7401 Bed confinement status: Secondary | ICD-10-CM | POA: Diagnosis not present

## 2020-10-11 DIAGNOSIS — J84115 Respiratory bronchiolitis interstitial lung disease: Secondary | ICD-10-CM | POA: Diagnosis not present

## 2020-10-11 DIAGNOSIS — K5790 Diverticulosis of intestine, part unspecified, without perforation or abscess without bleeding: Secondary | ICD-10-CM | POA: Diagnosis not present

## 2020-10-11 DIAGNOSIS — Z86711 Personal history of pulmonary embolism: Secondary | ICD-10-CM | POA: Diagnosis not present

## 2020-10-11 DIAGNOSIS — D849 Immunodeficiency, unspecified: Secondary | ICD-10-CM | POA: Diagnosis not present

## 2020-10-11 DIAGNOSIS — G894 Chronic pain syndrome: Secondary | ICD-10-CM | POA: Diagnosis not present

## 2020-10-11 DIAGNOSIS — E11618 Type 2 diabetes mellitus with other diabetic arthropathy: Secondary | ICD-10-CM | POA: Diagnosis not present

## 2020-10-11 DIAGNOSIS — M2550 Pain in unspecified joint: Secondary | ICD-10-CM | POA: Diagnosis not present

## 2020-10-11 DIAGNOSIS — F418 Other specified anxiety disorders: Secondary | ICD-10-CM | POA: Diagnosis not present

## 2020-10-11 NOTE — Patient Outreach (Signed)
[facility]) Care Management  10/11/2020  [name] [birth year] [id number]   Transition of care -SNF d/c 7/29 [name] ED visiting 8/2-8/3 (Anemia/SOB/Palpitations)  RN attempted outreach today however unsuccessful. No answer and unable to leave a HIPAA voice message.  Will continue attempts to reach pt for [facility] services over the next week.  [name], RN Care Management Coordinator [place] Office [phone]

## 2020-10-13 DIAGNOSIS — J84115 Respiratory bronchiolitis interstitial lung disease: Secondary | ICD-10-CM | POA: Diagnosis not present

## 2020-10-13 DIAGNOSIS — M06071 Rheumatoid arthritis without rheumatoid factor, right ankle and foot: Secondary | ICD-10-CM | POA: Diagnosis not present

## 2020-10-13 DIAGNOSIS — M06072 Rheumatoid arthritis without rheumatoid factor, left ankle and foot: Secondary | ICD-10-CM | POA: Diagnosis not present

## 2020-10-13 DIAGNOSIS — J439 Emphysema, unspecified: Secondary | ICD-10-CM | POA: Diagnosis not present

## 2020-10-13 DIAGNOSIS — G894 Chronic pain syndrome: Secondary | ICD-10-CM | POA: Diagnosis not present

## 2020-10-13 DIAGNOSIS — E1122 Type 2 diabetes mellitus with diabetic chronic kidney disease: Secondary | ICD-10-CM | POA: Diagnosis not present

## 2020-10-16 DIAGNOSIS — J439 Emphysema, unspecified: Secondary | ICD-10-CM | POA: Diagnosis not present

## 2020-10-16 DIAGNOSIS — J84115 Respiratory bronchiolitis interstitial lung disease: Secondary | ICD-10-CM | POA: Diagnosis not present

## 2020-10-16 DIAGNOSIS — D649 Anemia, unspecified: Secondary | ICD-10-CM | POA: Diagnosis not present

## 2020-10-16 DIAGNOSIS — G894 Chronic pain syndrome: Secondary | ICD-10-CM | POA: Diagnosis not present

## 2020-10-16 DIAGNOSIS — E119 Type 2 diabetes mellitus without complications: Secondary | ICD-10-CM | POA: Diagnosis not present

## 2020-10-16 DIAGNOSIS — M06072 Rheumatoid arthritis without rheumatoid factor, left ankle and foot: Secondary | ICD-10-CM | POA: Diagnosis not present

## 2020-10-16 DIAGNOSIS — M06071 Rheumatoid arthritis without rheumatoid factor, right ankle and foot: Secondary | ICD-10-CM | POA: Diagnosis not present

## 2020-10-16 DIAGNOSIS — E1122 Type 2 diabetes mellitus with diabetic chronic kidney disease: Secondary | ICD-10-CM | POA: Diagnosis not present

## 2020-10-17 ENCOUNTER — Other Ambulatory Visit: Payer: Self-pay | Admitting: *Deleted

## 2020-10-17 ENCOUNTER — Telehealth: Payer: Self-pay | Admitting: Pulmonary Disease

## 2020-10-17 NOTE — Patient Outreach (Signed)
[facility]) Care Management  10/17/2020  [name] January 11, 1941 [id number]   Outreach attempt #2, successful however member state this is not a good time to talk.  Request member call this care manager back. Will await call back, if no call back will have assigned RNCM follow up within the next week.  [name], [place], MSN [place] [phone]

## 2020-10-17 NOTE — Telephone Encounter (Signed)
CT scan reviewed. No need for VQ scan.

## 2020-10-17 NOTE — Telephone Encounter (Signed)
MH please advise if scan is still needed? Thanks :)

## 2020-10-17 NOTE — Telephone Encounter (Signed)
Noted. Will make [facility] team aware.   No further scan needed at this time. Ordering MD to contact patient regarding results of CT Angio.   Nothing further needed at this time.

## 2020-10-17 NOTE — Telephone Encounter (Signed)
I have cancelled the VQ scan.

## 2020-10-19 DIAGNOSIS — J84115 Respiratory bronchiolitis interstitial lung disease: Secondary | ICD-10-CM | POA: Diagnosis not present

## 2020-10-19 DIAGNOSIS — J439 Emphysema, unspecified: Secondary | ICD-10-CM | POA: Diagnosis not present

## 2020-10-19 DIAGNOSIS — G894 Chronic pain syndrome: Secondary | ICD-10-CM | POA: Diagnosis not present

## 2020-10-19 DIAGNOSIS — E1122 Type 2 diabetes mellitus with diabetic chronic kidney disease: Secondary | ICD-10-CM | POA: Diagnosis not present

## 2020-10-19 DIAGNOSIS — M06071 Rheumatoid arthritis without rheumatoid factor, right ankle and foot: Secondary | ICD-10-CM | POA: Diagnosis not present

## 2020-10-19 DIAGNOSIS — M06072 Rheumatoid arthritis without rheumatoid factor, left ankle and foot: Secondary | ICD-10-CM | POA: Diagnosis not present

## 2020-10-20 DIAGNOSIS — J439 Emphysema, unspecified: Secondary | ICD-10-CM | POA: Diagnosis not present

## 2020-10-20 DIAGNOSIS — M06071 Rheumatoid arthritis without rheumatoid factor, right ankle and foot: Secondary | ICD-10-CM | POA: Diagnosis not present

## 2020-10-20 DIAGNOSIS — J84115 Respiratory bronchiolitis interstitial lung disease: Secondary | ICD-10-CM | POA: Diagnosis not present

## 2020-10-20 DIAGNOSIS — M06072 Rheumatoid arthritis without rheumatoid factor, left ankle and foot: Secondary | ICD-10-CM | POA: Diagnosis not present

## 2020-10-20 DIAGNOSIS — G894 Chronic pain syndrome: Secondary | ICD-10-CM | POA: Diagnosis not present

## 2020-10-20 DIAGNOSIS — E1122 Type 2 diabetes mellitus with diabetic chronic kidney disease: Secondary | ICD-10-CM | POA: Diagnosis not present

## 2020-10-24 ENCOUNTER — Other Ambulatory Visit: Payer: Self-pay | Admitting: *Deleted

## 2020-10-24 DIAGNOSIS — G894 Chronic pain syndrome: Secondary | ICD-10-CM | POA: Diagnosis not present

## 2020-10-24 DIAGNOSIS — M06071 Rheumatoid arthritis without rheumatoid factor, right ankle and foot: Secondary | ICD-10-CM | POA: Diagnosis not present

## 2020-10-24 DIAGNOSIS — M06072 Rheumatoid arthritis without rheumatoid factor, left ankle and foot: Secondary | ICD-10-CM | POA: Diagnosis not present

## 2020-10-24 DIAGNOSIS — J84115 Respiratory bronchiolitis interstitial lung disease: Secondary | ICD-10-CM | POA: Diagnosis not present

## 2020-10-24 DIAGNOSIS — E1122 Type 2 diabetes mellitus with diabetic chronic kidney disease: Secondary | ICD-10-CM | POA: Diagnosis not present

## 2020-10-24 DIAGNOSIS — J439 Emphysema, unspecified: Secondary | ICD-10-CM | POA: Diagnosis not present

## 2020-10-24 NOTE — Patient Outreach (Signed)
[facility]) Care Management  10/24/2020  [name] 04-Sep-1940 [id number]   Telephone Assessment-Unsuccessful  Outreach #3  RN attempt an outreach call to pt today however unsuccessful. RN able to leave a HIPAA approved voice message requesting a call back.  Will reach out once again next month for pending services.  [name], RN Care Management Coordinator [place] Office [phone]

## 2020-10-25 DIAGNOSIS — J84115 Respiratory bronchiolitis interstitial lung disease: Secondary | ICD-10-CM | POA: Diagnosis not present

## 2020-10-25 DIAGNOSIS — G894 Chronic pain syndrome: Secondary | ICD-10-CM | POA: Diagnosis not present

## 2020-10-25 DIAGNOSIS — J439 Emphysema, unspecified: Secondary | ICD-10-CM | POA: Diagnosis not present

## 2020-10-25 DIAGNOSIS — M06071 Rheumatoid arthritis without rheumatoid factor, right ankle and foot: Secondary | ICD-10-CM | POA: Diagnosis not present

## 2020-10-25 DIAGNOSIS — M06072 Rheumatoid arthritis without rheumatoid factor, left ankle and foot: Secondary | ICD-10-CM | POA: Diagnosis not present

## 2020-10-25 DIAGNOSIS — E1122 Type 2 diabetes mellitus with diabetic chronic kidney disease: Secondary | ICD-10-CM | POA: Diagnosis not present

## 2020-10-27 ENCOUNTER — Other Ambulatory Visit: Payer: Self-pay

## 2020-10-27 ENCOUNTER — Emergency Department (HOSPITAL_COMMUNITY): Payer: Medicare Other

## 2020-10-27 ENCOUNTER — Encounter (HOSPITAL_COMMUNITY): Payer: Self-pay | Admitting: Emergency Medicine

## 2020-10-27 ENCOUNTER — Inpatient Hospital Stay (HOSPITAL_COMMUNITY)
Admission: EM | Admit: 2020-10-27 | Discharge: 2020-10-30 | DRG: 378 | Disposition: A | Discharge status: Home / Self Care | Payer: Medicare Other | Attending: Family Medicine | Admitting: Family Medicine

## 2020-10-27 DIAGNOSIS — Z20822 Contact with and (suspected) exposure to covid-19: Secondary | ICD-10-CM | POA: Diagnosis present

## 2020-10-27 DIAGNOSIS — R531 Weakness: Secondary | ICD-10-CM

## 2020-10-27 DIAGNOSIS — D649 Anemia, unspecified: Secondary | ICD-10-CM

## 2020-10-27 DIAGNOSIS — Z86010 Personal history of colonic polyps: Secondary | ICD-10-CM | POA: Diagnosis not present

## 2020-10-27 DIAGNOSIS — K922 Gastrointestinal hemorrhage, unspecified: Secondary | ICD-10-CM | POA: Diagnosis present

## 2020-10-27 DIAGNOSIS — N179 Acute kidney failure, unspecified: Secondary | ICD-10-CM | POA: Diagnosis present

## 2020-10-27 DIAGNOSIS — K31811 Angiodysplasia of stomach and duodenum with bleeding: Secondary | ICD-10-CM | POA: Diagnosis present

## 2020-10-27 DIAGNOSIS — J811 Chronic pulmonary edema: Secondary | ICD-10-CM | POA: Diagnosis not present

## 2020-10-27 DIAGNOSIS — I517 Cardiomegaly: Secondary | ICD-10-CM | POA: Diagnosis not present

## 2020-10-27 DIAGNOSIS — Z794 Long term (current) use of insulin: Secondary | ICD-10-CM

## 2020-10-27 DIAGNOSIS — K921 Melena: Secondary | ICD-10-CM | POA: Diagnosis not present

## 2020-10-27 DIAGNOSIS — Z87891 Personal history of nicotine dependence: Secondary | ICD-10-CM

## 2020-10-27 DIAGNOSIS — M069 Rheumatoid arthritis, unspecified: Secondary | ICD-10-CM | POA: Diagnosis present

## 2020-10-27 DIAGNOSIS — Z8719 Personal history of other diseases of the digestive system: Secondary | ICD-10-CM | POA: Diagnosis not present

## 2020-10-27 DIAGNOSIS — K449 Diaphragmatic hernia without obstruction or gangrene: Secondary | ICD-10-CM | POA: Diagnosis present

## 2020-10-27 DIAGNOSIS — E1151 Type 2 diabetes mellitus with diabetic peripheral angiopathy without gangrene: Secondary | ICD-10-CM | POA: Diagnosis not present

## 2020-10-27 DIAGNOSIS — G4730 Sleep apnea, unspecified: Secondary | ICD-10-CM | POA: Diagnosis present

## 2020-10-27 DIAGNOSIS — R1084 Generalized abdominal pain: Secondary | ICD-10-CM

## 2020-10-27 DIAGNOSIS — R1110 Vomiting, unspecified: Secondary | ICD-10-CM | POA: Diagnosis not present

## 2020-10-27 DIAGNOSIS — Z7951 Long term (current) use of inhaled steroids: Secondary | ICD-10-CM

## 2020-10-27 DIAGNOSIS — J439 Emphysema, unspecified: Secondary | ICD-10-CM | POA: Diagnosis present

## 2020-10-27 DIAGNOSIS — R000 Tachycardia, unspecified: Secondary | ICD-10-CM | POA: Diagnosis not present

## 2020-10-27 DIAGNOSIS — E119 Type 2 diabetes mellitus without complications: Secondary | ICD-10-CM | POA: Diagnosis present

## 2020-10-27 DIAGNOSIS — M810 Age-related osteoporosis without current pathological fracture: Secondary | ICD-10-CM | POA: Diagnosis present

## 2020-10-27 DIAGNOSIS — Z993 Dependence on wheelchair: Secondary | ICD-10-CM

## 2020-10-27 DIAGNOSIS — Z833 Family history of diabetes mellitus: Secondary | ICD-10-CM | POA: Diagnosis not present

## 2020-10-27 DIAGNOSIS — Z86711 Personal history of pulmonary embolism: Secondary | ICD-10-CM | POA: Diagnosis not present

## 2020-10-27 DIAGNOSIS — D509 Iron deficiency anemia, unspecified: Secondary | ICD-10-CM | POA: Diagnosis not present

## 2020-10-27 DIAGNOSIS — M797 Fibromyalgia: Secondary | ICD-10-CM | POA: Diagnosis present

## 2020-10-27 DIAGNOSIS — Z881 Allergy status to other antibiotic agents status: Secondary | ICD-10-CM | POA: Diagnosis not present

## 2020-10-27 DIAGNOSIS — R109 Unspecified abdominal pain: Secondary | ICD-10-CM | POA: Diagnosis not present

## 2020-10-27 DIAGNOSIS — Z8249 Family history of ischemic heart disease and other diseases of the circulatory system: Secondary | ICD-10-CM | POA: Diagnosis not present

## 2020-10-27 DIAGNOSIS — Z79899 Other long term (current) drug therapy: Secondary | ICD-10-CM

## 2020-10-27 DIAGNOSIS — Z882 Allergy status to sulfonamides status: Secondary | ICD-10-CM

## 2020-10-27 DIAGNOSIS — D62 Acute posthemorrhagic anemia: Secondary | ICD-10-CM | POA: Diagnosis present

## 2020-10-27 DIAGNOSIS — K862 Cyst of pancreas: Secondary | ICD-10-CM | POA: Diagnosis present

## 2020-10-27 DIAGNOSIS — Z90711 Acquired absence of uterus with remaining cervical stump: Secondary | ICD-10-CM | POA: Diagnosis not present

## 2020-10-27 DIAGNOSIS — J45909 Unspecified asthma, uncomplicated: Secondary | ICD-10-CM | POA: Diagnosis present

## 2020-10-27 DIAGNOSIS — Z7952 Long term (current) use of systemic steroids: Secondary | ICD-10-CM

## 2020-10-27 DIAGNOSIS — I700 Atherosclerosis of aorta: Secondary | ICD-10-CM | POA: Diagnosis present

## 2020-10-27 DIAGNOSIS — Z7901 Long term (current) use of anticoagulants: Secondary | ICD-10-CM | POA: Diagnosis not present

## 2020-10-27 DIAGNOSIS — Z7984 Long term (current) use of oral hypoglycemic drugs: Secondary | ICD-10-CM

## 2020-10-27 DIAGNOSIS — R059 Cough, unspecified: Secondary | ICD-10-CM | POA: Diagnosis not present

## 2020-10-27 DIAGNOSIS — Z96659 Presence of unspecified artificial knee joint: Secondary | ICD-10-CM | POA: Diagnosis present

## 2020-10-27 DIAGNOSIS — I2699 Other pulmonary embolism without acute cor pulmonale: Secondary | ICD-10-CM | POA: Diagnosis not present

## 2020-10-27 DIAGNOSIS — K3189 Other diseases of stomach and duodenum: Secondary | ICD-10-CM | POA: Diagnosis not present

## 2020-10-27 DIAGNOSIS — Z888 Allergy status to other drugs, medicaments and biological substances status: Secondary | ICD-10-CM

## 2020-10-27 DIAGNOSIS — Z880 Allergy status to penicillin: Secondary | ICD-10-CM | POA: Diagnosis not present

## 2020-10-27 LAB — URINALYSIS, ROUTINE W REFLEX MICROSCOPIC
Bilirubin Urine: NEGATIVE
Glucose, UA: NEGATIVE mg/dL
Hgb urine dipstick: NEGATIVE
Ketones, ur: NEGATIVE mg/dL
Leukocytes,Ua: NEGATIVE
Nitrite: NEGATIVE
Protein, ur: NEGATIVE mg/dL
Specific Gravity, Urine: 1.03 (ref 1.005–1.030)
pH: 6 (ref 5.0–8.0)

## 2020-10-27 LAB — CBC WITH DIFFERENTIAL/PLATELET
Abs Immature Granulocytes: 0.09 10*3/uL — ABNORMAL HIGH (ref 0.00–0.07)
Basophils Absolute: 0 10*3/uL (ref 0.0–0.1)
Basophils Relative: 0 %
Eosinophils Absolute: 0 10*3/uL (ref 0.0–0.5)
Eosinophils Relative: 0 %
HCT: 25.9 % — ABNORMAL LOW (ref 36.0–46.0)
Hemoglobin: 7.2 g/dL — ABNORMAL LOW (ref 12.0–15.0)
Immature Granulocytes: 1 %
Lymphocytes Relative: 13 %
Lymphs Abs: 1.4 10*3/uL (ref 0.7–4.0)
MCH: 21.7 pg — ABNORMAL LOW (ref 26.0–34.0)
MCHC: 27.8 g/dL — ABNORMAL LOW (ref 30.0–36.0)
MCV: 78 fL — ABNORMAL LOW (ref 80.0–100.0)
Monocytes Absolute: 0.6 10*3/uL (ref 0.1–1.0)
Monocytes Relative: 6 %
Neutro Abs: 8.8 10*3/uL — ABNORMAL HIGH (ref 1.7–7.7)
Neutrophils Relative %: 80 %
Platelets: 556 10*3/uL — ABNORMAL HIGH (ref 150–400)
RBC: 3.32 MIL/uL — ABNORMAL LOW (ref 3.87–5.11)
RDW: 17.5 % — ABNORMAL HIGH (ref 11.5–15.5)
WBC: 10.8 10*3/uL — ABNORMAL HIGH (ref 4.0–10.5)
nRBC: 0 % (ref 0.0–0.2)

## 2020-10-27 LAB — CK: Total CK: 19 U/L — ABNORMAL LOW (ref 38–234)

## 2020-10-27 LAB — RESP PANEL BY RT-PCR (FLU A&B, COVID) ARPGX2
Influenza A by PCR: NEGATIVE
Influenza B by PCR: NEGATIVE
SARS Coronavirus 2 by RT PCR: NEGATIVE

## 2020-10-27 LAB — COMPREHENSIVE METABOLIC PANEL
ALT: 11 U/L (ref 0–44)
AST: 22 U/L (ref 15–41)
Albumin: 3 g/dL — ABNORMAL LOW (ref 3.5–5.0)
Alkaline Phosphatase: 77 U/L (ref 38–126)
Anion gap: 8 (ref 5–15)
BUN: 28 mg/dL — ABNORMAL HIGH (ref 8–23)
CO2: 22 mmol/L (ref 22–32)
Calcium: 9.2 mg/dL (ref 8.9–10.3)
Chloride: 107 mmol/L (ref 98–111)
Creatinine, Ser: 1.21 mg/dL — ABNORMAL HIGH (ref 0.44–1.00)
GFR, Estimated: 46 mL/min — ABNORMAL LOW (ref >60)
Glucose, Bld: 133 mg/dL — ABNORMAL HIGH (ref 70–99)
Potassium: 4.6 mmol/L (ref 3.5–5.1)
Sodium: 137 mmol/L (ref 135–145)
Total Bilirubin: 0.5 mg/dL (ref 0.3–1.2)
Total Protein: 6.8 g/dL (ref 6.5–8.1)

## 2020-10-27 LAB — POC OCCULT BLOOD, ED: Fecal Occult Bld: NEGATIVE

## 2020-10-27 LAB — PREPARE RBC (CROSSMATCH)

## 2020-10-27 LAB — LIPASE, BLOOD: Lipase: 28 U/L (ref 11–51)

## 2020-10-27 MED ORDER — ATORVASTATIN CALCIUM 20 MG PO TABS
20.0000 mg | ORAL_TABLET | Freq: Every day | ORAL | Status: DC
  Administered 2020-10-29 – 2020-10-30 (×2): 20 mg via ORAL
  Filled 2020-10-27 (×2): qty 1

## 2020-10-27 MED ORDER — BUDESONIDE 0.25 MG/2ML IN SUSP
0.2500 mg | Freq: Two times a day (BID) | RESPIRATORY_TRACT | Status: DC
  Administered 2020-10-28 (×2): 0.25 mg via RESPIRATORY_TRACT
  Filled 2020-10-27 (×5): qty 2

## 2020-10-27 MED ORDER — DIPHENHYDRAMINE HCL 50 MG/ML IJ SOLN
25.0000 mg | Freq: Once | INTRAMUSCULAR | Status: AC
  Administered 2020-10-27: 25 mg via INTRAVENOUS
  Filled 2020-10-27: qty 1

## 2020-10-27 MED ORDER — VITAMIN B-12 1000 MCG PO TABS
2500.0000 ug | ORAL_TABLET | Freq: Every day | ORAL | Status: DC
  Administered 2020-10-27 – 2020-10-29 (×3): 2500 ug via ORAL
  Filled 2020-10-27 (×3): qty 3

## 2020-10-27 MED ORDER — FESOTERODINE FUMARATE ER 4 MG PO TB24
4.0000 mg | ORAL_TABLET | Freq: Every day | ORAL | Status: DC
  Administered 2020-10-29 – 2020-10-30 (×2): 4 mg via ORAL
  Filled 2020-10-27 (×3): qty 1

## 2020-10-27 MED ORDER — PANTOPRAZOLE INFUSION (NEW) - SIMPLE MED
8.0000 mg/h | INTRAVENOUS | Status: AC
  Administered 2020-10-27 – 2020-10-30 (×5): 8 mg/h via INTRAVENOUS
  Filled 2020-10-27 (×2): qty 80
  Filled 2020-10-27: qty 100
  Filled 2020-10-27: qty 80
  Filled 2020-10-27 (×3): qty 100
  Filled 2020-10-27 (×2): qty 80

## 2020-10-27 MED ORDER — IOHEXOL 350 MG/ML SOLN
80.0000 mL | Freq: Once | INTRAVENOUS | Status: AC | PRN
  Administered 2020-10-27: 65 mL via INTRAVENOUS

## 2020-10-27 MED ORDER — FERROUS GLUCONATE 324 (38 FE) MG PO TABS
324.0000 mg | ORAL_TABLET | ORAL | Status: DC
  Administered 2020-10-30: 324 mg via ORAL
  Filled 2020-10-27 (×2): qty 1

## 2020-10-27 MED ORDER — EZETIMIBE 10 MG PO TABS
10.0000 mg | ORAL_TABLET | Freq: Every day | ORAL | Status: DC
  Administered 2020-10-27 – 2020-10-29 (×3): 10 mg via ORAL
  Filled 2020-10-27 (×3): qty 1

## 2020-10-27 MED ORDER — ALBUTEROL SULFATE HFA 108 (90 BASE) MCG/ACT IN AERS: 2.0000 | INHALATION_SPRAY | Freq: Four times a day (QID) | RESPIRATORY_TRACT | Status: DC | PRN

## 2020-10-27 MED ORDER — DULOXETINE HCL 60 MG PO CPEP
60.0000 mg | ORAL_CAPSULE | Freq: Every day | ORAL | Status: DC
  Administered 2020-10-27 – 2020-10-29 (×3): 60 mg via ORAL
  Filled 2020-10-27: qty 2
  Filled 2020-10-27 (×2): qty 1

## 2020-10-27 MED ORDER — SODIUM CHLORIDE 0.9 % IV SOLN: 10.0000 mL/h | Freq: Once | INTRAVENOUS | Status: DC

## 2020-10-27 MED ORDER — PREDNISONE 5 MG PO TABS
5.0000 mg | ORAL_TABLET | Freq: Every day | ORAL | Status: DC
  Administered 2020-10-27 – 2020-10-29 (×3): 5 mg via ORAL
  Filled 2020-10-27 (×3): qty 1

## 2020-10-27 MED ORDER — PANTOPRAZOLE SODIUM 40 MG IV SOLR: 40.0000 mg | Freq: Two times a day (BID) | INTRAVENOUS | Status: DC

## 2020-10-27 MED ORDER — IOHEXOL 350 MG/ML SOLN
100.0000 mL | Freq: Once | INTRAVENOUS | Status: DC | PRN
Start: 2020-10-27 — End: 2020-10-27

## 2020-10-27 MED ORDER — PANTOPRAZOLE 80MG IVPB - SIMPLE MED
80.0000 mg | Freq: Once | INTRAVENOUS | Status: AC
  Administered 2020-10-27: 80 mg via INTRAVENOUS
  Filled 2020-10-27: qty 80

## 2020-10-27 MED ORDER — ALBUTEROL SULFATE (2.5 MG/3ML) 0.083% IN NEBU: 2.5000 mg | INHALATION_SOLUTION | Freq: Four times a day (QID) | RESPIRATORY_TRACT | Status: DC | PRN

## 2020-10-27 NOTE — ED Provider Notes (Signed)
Care assumed from [name] at shift change, please see her note for full details, but in brief [name] is a 80 y.o. female with a history of PE on anticoagulation presents for evaluation of low hemoglobin and myalgias.  She has had darker stools over the last few weeks, similar to her symptoms with previous GI bleed.  She has also been having some diffuse myalgias but no fevers, chills or infectious symptoms.  Is having some abdominal pain associated with symptoms.  Seen by PCP today and had a hemoglobin of 7.1 decreased from previous of 8.6 with baseline typically around 10.  Followed by [facility].  Work-up thus far significant for hemoglobin of 7.2, mild leukocytosis, Hemoccult was negative but there was no blood in the rectal vault at this time per prior provider so do not feel this negative result is very significant.  Creatinine slightly improved from previous.  Myalgia CK was ordered which was negative.  Type and screen pending and 1 unit transfusion ordered, as well as Protonix drip.  Given abdominal tenderness on exam CT is pending.  Afterwards patient will need GI consult and admission given bleeding on anticoagulation.  BP 117/62   Pulse 95   Temp 98 F (36.7 C) (Oral)   Resp 15   SpO2 100%    ED Course/Procedures   Labs Reviewed  CBC WITH DIFFERENTIAL/PLATELET - Abnormal; Notable for the following components:      Result Value   WBC 10.8 (*)    RBC 3.32 (*)    Hemoglobin 7.2 (*)    HCT 25.9 (*)    MCV 78.0 (*)    MCH 21.7 (*)    MCHC 27.8 (*)    RDW 17.5 (*)    Platelets 556 (*)    Neutro Abs 8.8 (*)    Abs Immature Granulocytes 0.09 (*)    All other components within normal limits  COMPREHENSIVE METABOLIC PANEL - Abnormal; Notable for the following components:   Glucose, Bld 133 (*)    BUN 28 (*)    Creatinine, Ser 1.21 (*)    Albumin 3.0 (*)    GFR, Estimated 46 (*)    All other components within normal limits  CK - Abnormal; Notable for the following  components:   Total CK 19 (*)    All other components within normal limits  RESP PANEL BY RT-PCR (FLU A&B, COVID) ARPGX2  LIPASE, BLOOD  URINALYSIS, ROUTINE W REFLEX MICROSCOPIC  POC OCCULT BLOOD, ED  TYPE AND SCREEN  PREPARE RBC (CROSSMATCH)    CT Abdomen Pelvis W Contrast  Result Date: 10/27/2020 CLINICAL DATA:  Generalized abdominal pain.  Vomiting.  Diarrhea. EXAM: CT ABDOMEN AND PELVIS WITH CONTRAST TECHNIQUE: Multidetector CT imaging of the abdomen and pelvis was performed using the standard protocol following bolus administration of intravenous contrast. CONTRAST:  29m OMNIPAQUE IOHEXOL 350 MG/ML SOLN COMPARISON:  10/10/2020 FINDINGS: Lower Chest: No acute findings. Mild bilateral lower lobe bronchiectasis and pleural-parenchymal scarring. Hepatobiliary: No hepatic masses identified. Gallbladder is unremarkable. No evidence of biliary ductal dilatation. Pancreas: 11 mm simple appearing cyst in the pancreatic neck remains stable. No evidence of pancreatic ductal dilatation or peripancreatic inflammatory changes. Spleen: Within normal limits in size. Splenic cysts remain stable, largest measuring 5 cm. Adrenals/Urinary Tract: A few tiny sub-cm renal cysts are again noted. No masses identified. No evidence of ureteral calculi or hydronephrosis. Unremarkable unopacified urinary bladder. Stomach/Bowel: No evidence of obstruction, inflammatory process or abnormal fluid collections. Normal appendix  visualized. Diverticulosis is seen mainly involving the sigmoid colon, however there is no evidence of diverticulitis. Vascular/Lymphatic: No pathologically enlarged lymph nodes. No acute vascular findings. Aortic atherosclerotic calcification noted. Reproductive: Prior hysterectomy noted. Adnexal regions are unremarkable in appearance. Other: Stable small bilateral inguinal hernias which contain only fat. Musculoskeletal: No suspicious bone lesions identified. Severe degenerative disc disease noted at L2-3,  L3-4, and L4-5. IMPRESSION: No acute findings within the abdomen or pelvis. Colonic diverticulosis, without radiographic evidence of diverticulitis. Stable 11 mm simple appearing cyst in pancreatic neck. Recommend continued imaging follow-up in 2 years, preferably with abdomen MRI without and with contrast. This recommendation follows ACR consensus guidelines: Management of Incidental Pancreatic Cysts: A White Paper of the ACR Incidental Findings Committee. [place] [id number]. Aortic Atherosclerosis (ICD10-I70.0). Electronically Signed   By: Marlaine Hind M.D.   On: 10/27/2020 17:30   DG Chest Portable 1 View  Result Date: 10/27/2020 CLINICAL DATA:  Cough, weakness and generalized pain. Vomiting for 4 days. EXAM: PORTABLE CHEST 1 VIEW COMPARISON:  October 10, 2020. FINDINGS: EKG leads project over the chest. Cardiomediastinal contours are stable likely with mild cardiac enlargement. Central pulmonary vascular congestion is a stable finding without signs of edema, consolidation or evidence of pleural effusion. No visible pneumothorax. On limited assessment no acute skeletal process. IMPRESSION: Stable mild cardiac enlargement and central pulmonary vascular congestion without acute process. Electronically Signed   By: Zetta Bills M.D.   On: 10/27/2020 14:54     Procedures  MDM   CT with diverticulosis and an unchanged 11 mm simple appearing cyst in the pancreatic neck but no other acute findings on CT.  Patient receiving blood transfusion, has remained hemodynamically stable.  Dr. Alessandra Bevels with [facility] consulted, they will plan to see the patient, patient can have clear liquids until midnight but then should be NPO.  Hold anticoagulation.  Will consult medicine for admission.  Case discussed with Dr. Flossie Buffy with [facility] hospitalist who will see and admit the patient.       Jacqlyn Larsen, PA-C 10/27/20 1839    Fredia Sorrow, MD 11/01/20 [phone]

## 2020-10-27 NOTE — H&P (Signed)
History and Physical    [name] [id number] DOB: 20-Mar-1940 DOA: 10/27/2020  PCP: [name], MD  Patient coming from: Home  I have personally briefly reviewed patient's old medical records in [place]  Chief Complaint: Melena, nausea vomiting  HPI: [name] is a 80 y.o. female with medical history significant for PE diagnosed in May 2022 currently on Eliquis, asthma, type 2 diabetes, history of GI bleed, rheumatoid arthritis on chronic low-dose prednisone therapy who presented from PCP with concerns of low hemoglobin.  Patient reports that for the past 4 days she has had significant weakness causing her not to be able to move around in her bed.  She is wheelchair-bound but is still normally able to transfer out of bed.  She normally takes 5 mg of prednisone for her rheumatoid arthritis and had to increase it to 45 mg today which gave her significant relief. She also notes several weeks of nausea and vomiting.  Denies any hematemesis but had an episode of bilious vomiting.  Also notes diarrhea with very dark stool but takes iron every other day.  Have some sharp abdominal pain and also to her lower back.  She presented to her PCP today with the symptoms and had lab work returning with hemoglobin of 7.1 and was prompted to present to the ED.  Patient was recently diagnosed with pulmonary embolism in May and started on Eliquis.  She then was found to have hematochezia in June.  Underwent EGD/colonoscopy on 6/14 which showed gastric polyps, nonbleeding duodenal AVM treated with cautery.  Also found to have colonic polyps removed, diverticulosis and anal/rectal prolapse.  She was then resumed on her home Eliquis with stable hemoglobin around 9.5.  She was recently evaluated in the ED on 8/2 with symptoms of weakness.  At that time hemoglobin noted to have downward trended to 8.6. She had repeat CTA chest showing resolution of prior pulmonary emboli without residual or chronic  thrombus.  No new or acute PE. CT of the abdomen with no acute findings.  In the ED today, she was afebrile and normotensive on room air.  Hemoglobin of 7.2.  Platelet 556.  Creatinine elevated 1.21 from a prior of 0.82.  BUN of 28.  Repeat CT abdomen and pelvis with no new acute findings. FOBT negative but no stool noted in rectal vault per ED PA.   ED PA Benedetto Goad discussed with [facility].  They recommend keeping her on clear liquid diet tonight and n.p.o. past midnight.  They will see in consultation in the morning.  Review of Systems:  Constitutional: No Weight Change, No Fever ENT/Mouth: No sore throat, No Rhinorrhea Eyes: No Eye Pain, No Vision Changes Cardiovascular: No Chest Pain, no SOB Respiratory: No Cough, No Sputum, No Wheezing, no Dyspnea  Gastrointestinal: +Nausea, + Vomiting, No Diarrhea, No Constipation, +Pain Genitourinary: no Urinary Incontinence, No Urgency, No Flank Pain Musculoskeletal: No Arthralgias, No Myalgias Skin: No Skin Lesions, No Pruritus, Neuro: no Weakness, No Numbness,  No Loss of Consciousness, No Syncope Psych: No Anxiety/Panic, No Depression, no decrease appetite Heme/Lymph: No Bruising, No Bleeding  Past Medical History:  Diagnosis Date   Acute pulmonary embolism (HCC)    b/l; dx'ed May '22   Asthma    Complication of anesthesia    Anesthesia told her years ago she got too cold during surgery   Diabetes mellitus without complication (HCC)    Emphysema    Fibromyalgia    Osteoporosis  Rheumatoid arthritis(714.0)    Sleep apnea    no cpap    Past Surgical History:  Procedure Laterality Date   ABDOMINAL HYSTERECTOMY     ANKLE FRACTURE SURGERY  2007   BREAST SURGERY     mammoplasty and then removed   COLONOSCOPY WITH PROPOFOL N/A 08/22/2020   Procedure: COLONOSCOPY WITH PROPOFOL;  Surgeon: Ronnette Juniper, MD;  Location: [facility];  Service: Gastroenterology;  Laterality: N/A;  If schedule availability permits, would like to add on  upper endoscopy as well   ESOPHAGOGASTRODUODENOSCOPY (EGD) WITH PROPOFOL N/A 08/22/2020   Procedure: ESOPHAGOGASTRODUODENOSCOPY (EGD) WITH PROPOFOL;  Surgeon: Ronnette Juniper, MD;  Location: [facility];  Service: Gastroenterology;  Laterality: N/A;   FOOT SURGERY     numerous   HOT HEMOSTASIS N/A 08/22/2020   Procedure: HOT HEMOSTASIS (ARGON PLASMA COAGULATION/BICAP);  Surgeon: Ronnette Juniper, MD;  Location: [facility];  Service: Gastroenterology;  Laterality: N/A;   I & D EXTREMITY Right 10/26/2014   Procedure: IRRIGATION AND DEBRIDEMENT RIGHT KNEE ;  Surgeon: Gaynelle Arabian, MD;  Location: [facility];  Service: Orthopedics;  Laterality: Right;   IRRIGATION AND DEBRIDEMENT KNEE Right 03/09/2017   Procedure: IRRIGATION AND DEBRIDEMENT RIGHT KNEE PRE-PATELLAR BURSA;  Surgeon: Susa Day, MD;  Location: [facility];  Service: Orthopedics;  Laterality: Right;   KNEE BURSECTOMY Right 09/16/2014   Procedure: EXCISON OF PRE PATELLA BURSA RIGHT KNEE ;  Surgeon: Gaynelle Arabian, MD;  Location: [facility];  Service: Orthopedics;  Laterality: Right;   PARTIAL HYSTERECTOMY  1970   POLYPECTOMY  08/22/2020   Procedure: POLYPECTOMY;  Surgeon: Ronnette Juniper, MD;  Location: [facility];  Service: Gastroenterology;;   TOTAL KNEE ARTHROPLASTY  2005 / 2006   x2     reports that she quit smoking about 26 years ago. Her smoking use included cigarettes. She has never used smokeless tobacco. She reports that she does not drink alcohol and does not use drugs. Social History  Allergies  Allergen Reactions   Penicillins Hives    Has patient had a PCN reaction causing immediate rash, facial/tongue/throat swelling, SOB or lightheadedness with hypotension: Yes Has patient had a PCN reaction causing severe rash involving mucus membranes or skin necrosis: No Has patient had a PCN reaction that required hospitalization: No Has patient had a PCN reaction occurring within the last 10 years: No If all of the above answers are "NO", then may  proceed with Cephalosporin use. Patient has tolerated ceftriaxone in the recent past.   Sulfamethoxazole-Trimethoprim Nausea And Vomiting and Other (See Comments)    Stomach problems   Adalimumab Other (See Comments)    "Not effective"   Hydroxychloroquine Sulfate Other (See Comments)    "Plaquenil did not work after [employer]"   Meperidine Other (See Comments)    Cramping   Meperidine Hcl Other (See Comments)    Cramping    Methotrexate Nausea And Vomiting and Other (See Comments)    "Projectile vomiting"   Penicillin G Sodium Hives   Phentermine-Topiramate Nausea Only and Other (See Comments)    "Nausea and dizziness & on re-trial, mental side effects so really cannot take it   Sulfa Antibiotics Nausea And Vomiting and Other (See Comments)    Stomach problems/GI upset, also   Sulfonamide Derivatives Nausea And Vomiting and Other (See Comments)    "Stomach problems"    Family History  Problem Relation Age of Onset   Hypertension Other    Diabetes Other    Stroke Other    Diabetes Mother  Diabetes Father    Diabetes Sister    Diabetes Maternal Aunt    Cancer Paternal Uncle    Cancer Paternal Grandmother    Breast cancer Paternal Grandmother      Prior to Admission medications   Medication Sig Start Date End Date Taking? Authorizing Provider  acetaminophen (TYLENOL) 500 MG tablet Take 1,000 mg by mouth every 6 (six) hours as needed (for headaches).    [provider]  albuterol (VENTOLIN HFA) 108 (90 Base) MCG/ACT inhaler Inhale 2 puffs into the lungs every 6 (six) hours as needed for shortness of breath or wheezing. 07/05/19   [provider]  apixaban (ELIQUIS) 5 MG TABS tablet Take 1 tablet (5 mg total) by mouth 2 (two) times daily. Start taking only from tomorrow 08/22/20 10/10/20  Shelly Coss, MD  arformoterol (BROVANA) 15 MCG/2ML NEBU Take 15 mcg by nebulization daily as needed (For shortness of breath).    [provider]  atorvastatin  (LIPITOR) 10 MG tablet Take 10 mg by mouth at bedtime.    [provider]  atorvastatin (LIPITOR) 20 MG tablet Take 20 mg by mouth daily. 10/23/20   [provider]  B-D UF III MINI PEN NEEDLES 31G X 5 MM MISC SMARTSIG:1 Each SUB-Q 4 Times Daily 10/22/19   [provider]  budesonide (PULMICORT) 0.25 MG/2ML nebulizer solution Take 2 mLs (0.25 mg total) by nebulization 2 (two) times daily. 05/26/20 09/14/20  [name], MD  Cyanocobalamin (B-12) 2500 MCG TABS Take 2,500 mcg by mouth at bedtime.     [provider]  DULoxetine (CYMBALTA) 60 MG capsule Take 60 mg by mouth at bedtime.     [provider]  ezetimibe (ZETIA) 10 MG tablet Take 10 mg by mouth at bedtime.  11/04/16   [provider]  ferrous gluconate (FERGON) 324 MG tablet Take 324 mg by mouth every evening.    [provider]  insulin NPH-regular Human (70-30) 100 UNIT/ML injection Inject 14 Units into the skin 2 (two) times daily with a meal.    [provider]  ipratropium-albuterol (DUONEB) 0.5-2.5 (3) MG/3ML SOLN Take 3 mLs by nebulization every 6 (six) hours as needed (wheezing). 07/19/20   [provider]  metFORMIN (GLUCOPHAGE) 1000 MG tablet Take 1,000 mg by mouth at bedtime.    [provider]  metFORMIN (GLUCOPHAGE) 500 MG tablet Take 500 mg by mouth 2 (two) times daily. 10/25/20   [provider]  NOVOLIN 70/30 KWIKPEN (70-30) 100 UNIT/ML KwikPen Inject into the skin. 10/20/20   [provider]  ondansetron (ZOFRAN-ODT) 8 MG disintegrating tablet Take by mouth. 10/24/20   [provider]  pantoprazole (PROTONIX) 40 MG tablet Take one tablet once every morning Patient taking differently: Take 40 mg by mouth daily. 05/26/20   [name], MD  predniSONE (DELTASONE) 5 MG tablet Take 5 mg by mouth at bedtime.    [provider]  tolterodine (DETROL LA) 2 MG 24 hr capsule Take 2 mg by mouth every evening. 08/15/20    [provider]  MYRBETRIQ 50 MG TB24 tablet Take 50 mg by mouth at bedtime. Patient not taking: Reported on 08/19/2020 03/29/20 08/19/20  [provider]    Physical Exam: Vitals:   10/27/20 1831 10/27/20 1854 10/27/20 1900 10/27/20 1903  BP: 134/70 138/63 132/68 127/65  Pulse: 96 96 96 96  Resp: '18 18 18 18  '$ Temp: 97.7 F (36.5 C)   97.9 F (36.6 C)  TempSrc: Oral  Oral  SpO2: 99% 98% 100% 100%    Constitutional: NAD, calm, comfortable, obese female laying flat in bed Vitals:   10/27/20 1831 10/27/20 1854 10/27/20 1900 10/27/20 1903  BP: 134/70 138/63 132/68 127/65  Pulse: 96 96 96 96  Resp: '18 18 18 18  '$ Temp: 97.7 F (36.5 C)   97.9 F (36.6 C)  TempSrc: Oral   Oral  SpO2: 99% 98% 100% 100%   Eyes: PERRL, lids and conjunctivae normal ENMT: Mucous membranes are moist.  Neck: normal, supple Respiratory: clear to auscultation bilaterally, no wheezing, no crackles. Normal respiratory effort. No accessory muscle use.  Cardiovascular: Regular rate and rhythm, no murmurs / rubs / gallops. No extremity edema.  Abdomen: Diffuse tenderness without any guarding, rigidity or rebound tenderness, no masses palpated. Bowel sounds positive.  Musculoskeletal: no clubbing / cyanosis. No joint deformity upper and lower extremities.  Normal muscle tone.  Upper extremity contracted in flexion Skin: no rashes, lesions, ulcers. No induration Neurologic: CN 2-12 grossly intact. Sensation intact, Strength 5/5 in all 4.  Psychiatric: Normal judgment and insight. Alert and oriented x 3. Normal mood.     Labs on Admission: I have personally reviewed following labs and imaging studies  CBC: Recent Labs  Lab 10/27/20 1326  WBC 10.8*  NEUTROABS 8.8*  HGB 7.2*  HCT 25.9*  MCV 78.0*  PLT [id number]*   Basic Metabolic Panel: Recent Labs  Lab 10/27/20 1326  NA 137  K 4.6  CL 107  CO2 22  GLUCOSE 133*  BUN 28*  CREATININE 1.21*  CALCIUM 9.2   GFR: CrCl cannot be  calculated (Unknown ideal weight.). Liver Function Tests: Recent Labs  Lab 10/27/20 1326  AST 22  ALT 11  ALKPHOS 77  BILITOT 0.5  PROT 6.8  ALBUMIN 3.0*   Recent Labs  Lab 10/27/20 1326  LIPASE 28   No results for input(s): AMMONIA in the last 168 hours. Coagulation Profile: No results for input(s): INR, PROTIME in the last 168 hours. Cardiac Enzymes: Recent Labs  Lab 10/27/20 1326  CKTOTAL 19*   BNP (last 3 results) No results for input(s): PROBNP in the last 8760 hours. HbA1C: No results for input(s): HGBA1C in the last 72 hours. CBG: No results for input(s): GLUCAP in the last 168 hours. Lipid Profile: No results for input(s): CHOL, HDL, LDLCALC, TRIG, CHOLHDL, LDLDIRECT in the last 72 hours. Thyroid Function Tests: No results for input(s): TSH, T4TOTAL, FREET4, T3FREE, THYROIDAB in the last 72 hours. Anemia Panel: No results for input(s): VITAMINB12, FOLATE, FERRITIN, TIBC, IRON, RETICCTPCT in the last 72 hours. Urine analysis:    Component Value Date/Time   COLORURINE YELLOW 10/10/2020 2040   APPEARANCEUR CLEAR 10/10/2020 2040   LABSPEC 1.017 10/10/2020 2040   PHURINE 5.0 10/10/2020 2040   GLUCOSEU NEGATIVE 10/10/2020 2040   GLUCOSEU NEGATIVE 12/24/2016 1239   HGBUR NEGATIVE 10/10/2020 2040   BILIRUBINUR NEGATIVE 10/10/2020 2040   KETONESUR NEGATIVE 10/10/2020 2040   PROTEINUR NEGATIVE 10/10/2020 2040   UROBILINOGEN 0.2 12/24/2016 1239   NITRITE NEGATIVE 10/10/2020 2040   LEUKOCYTESUR NEGATIVE 10/10/2020 2040    Radiological Exams on Admission: CT Abdomen Pelvis W Contrast  Result Date: 10/27/2020 CLINICAL DATA:  Generalized abdominal pain.  Vomiting.  Diarrhea. EXAM: CT ABDOMEN AND PELVIS WITH CONTRAST TECHNIQUE: Multidetector CT imaging of the abdomen and pelvis was performed using the standard protocol following bolus administration of intravenous contrast. CONTRAST:  19m OMNIPAQUE IOHEXOL 350 MG/ML SOLN COMPARISON:  10/10/2020 FINDINGS: Lower  Chest: No  acute findings. Mild bilateral lower lobe bronchiectasis and pleural-parenchymal scarring. Hepatobiliary: No hepatic masses identified. Gallbladder is unremarkable. No evidence of biliary ductal dilatation. Pancreas: 11 mm simple appearing cyst in the pancreatic neck remains stable. No evidence of pancreatic ductal dilatation or peripancreatic inflammatory changes. Spleen: Within normal limits in size. Splenic cysts remain stable, largest measuring 5 cm. Adrenals/Urinary Tract: A few tiny sub-cm renal cysts are again noted. No masses identified. No evidence of ureteral calculi or hydronephrosis. Unremarkable unopacified urinary bladder. Stomach/Bowel: No evidence of obstruction, inflammatory process or abnormal fluid collections. Normal appendix visualized. Diverticulosis is seen mainly involving the sigmoid colon, however there is no evidence of diverticulitis. Vascular/Lymphatic: No pathologically enlarged lymph nodes. No acute vascular findings. Aortic atherosclerotic calcification noted. Reproductive: Prior hysterectomy noted. Adnexal regions are unremarkable in appearance. Other: Stable small bilateral inguinal hernias which contain only fat. Musculoskeletal: No suspicious bone lesions identified. Severe degenerative disc disease noted at L2-3, L3-4, and L4-5. IMPRESSION: No acute findings within the abdomen or pelvis. Colonic diverticulosis, without radiographic evidence of diverticulitis. Stable 11 mm simple appearing cyst in pancreatic neck. Recommend continued imaging follow-up in 2 years, preferably with abdomen MRI without and with contrast. This recommendation follows ACR consensus guidelines: Management of Incidental Pancreatic Cysts: A White Paper of the ACR Incidental Findings Committee. [place] [id number]. Aortic Atherosclerosis (ICD10-I70.0). Electronically Signed   By: Marlaine Hind M.D.   On: 10/27/2020 17:30   DG Chest Portable 1 View  Result Date: 10/27/2020 CLINICAL  DATA:  Cough, weakness and generalized pain. Vomiting for 4 days. EXAM: PORTABLE CHEST 1 VIEW COMPARISON:  October 10, 2020. FINDINGS: EKG leads project over the chest. Cardiomediastinal contours are stable likely with mild cardiac enlargement. Central pulmonary vascular congestion is a stable finding without signs of edema, consolidation or evidence of pleural effusion. No visible pneumothorax. On limited assessment no acute skeletal process. IMPRESSION: Stable mild cardiac enlargement and central pulmonary vascular congestion without acute process. Electronically Signed   By: Zetta Bills M.D.   On: 10/27/2020 14:54      Assessment/Plan  Acute blood loss anemia/Upper GI bleed -has hx of GI bleed in June on Eliquis  - Underwent EGD/colonoscopy on 6/14 which showed gastric polyps, nonbleeding duodenal AVM treated with cautery.  -Last dose of Eliquis this morning 8/19. Will hold.  -Transfuse 1 unit packed red blood cell.  Repeat CBC following transfusion - transfusion threshold Hgb of <7 -Continue IV PPI infusion  Recent history of bilateral pulmonary embolism  -dx in May at [facility] and started on Eliquis. Likely due to sedentary lifestyle as patient is wheelchair-bound due to her rheumatoid arthritis -Had repeat CTA chest on 8/2 that showed resolution of her thrombus and due to acute pulmonary embolism.  Given her recurrent GI bleed, advise d/c Eliquis as benefit outweight risk at this time.   AKI - Creatinine elevated to 1.21 from prior 0 0.82 - Follow with repeat labs following transfusion - Avoid nephrotoxic agent  Rheumatoid arthritis - Continue low-dose prednisone  Insulin-dependent type 2 diabetes - Hold on sliding scale for now while she will be n.p.o. midnight  Pancreatic neck cyst - Incidental finding on CT abdomen of an 11 mm simple appearing cyst - Recommend follow-up in 2 years with abdominal MRI with and without contrast  Asthma/COPD - Continue home  bronchodilator  DVT prophylaxis:.SCD Code Status: Full Family Communication: Plan discussed with patient at bedside  disposition Plan: Home with at least 2 midnight stays  Consults called:  GI Admission status: inpatient  Level of care: Progressive-patient notified that we currently do not have progressive beds here at [facility] and was offered transfer to [facility].  However patient states that "she had bad experiences there before" and would like to remain here at [place] long even if it means remaining in the ED overnight.  Status is: Inpatient  Remains inpatient appropriate because:Inpatient level of care appropriate due to severity of illness  Dispo: The patient is from: Home              Anticipated d/c is to: Home              Patient currently is not medically stable to d/c.   Difficult to place patient No         Orene Desanctis DO [facility]   If 7PM-7AM, please contact night-coverage [email or web address]   10/27/2020, 7:11 PM

## 2020-10-27 NOTE — ED Provider Notes (Signed)
[facility] DEPT Provider Note   CSN: [id number] Arrival date & time: 10/27/20  1245     History Chief Complaint  Patient presents with   Weakness    [name] is a 80 y.o. female past medical history significant for PE on chronic anticoagulation, diabetes, fibromyalgia, prior lower GI bleed who presents for evaluation of multiple complaints.  Patient states she has had diffuse myalgias over the last 4 days.  Has had so much pain she is unable to get out of bed.  She is chronically wheelchair-bound.  She does not ambulate.  States she took a 40 mg dose of steroids yesterday which helped her she feels extreme fatigue.  She has a mild cough which is nonproductive.  States she has had darker stools over the last few weeks.  Some associated abdominal pain.  States is intermittent and cramping in nature.  She is also had multiple episodes of NBNB emesis.  Was seen by NP at PCP office today.  There is some concern for acute lower GI bleed given patient's chronically black stool and persistent anticoagulation use.  Denies fever, chills, headache, chest pain, shortness of breath, back pain, dysuria or hematuria.  According to PCP note patient hemoglobin 7.1.  They wanted her transfused, admitted with GI consult given this is reoccurring since she is on anticoagulation.  Patient unsure of her last bowel movement.  History obtained from patient and past medical records.  No interpreter used.  HPI     Past Medical History:  Diagnosis Date   Acute pulmonary embolism ([place])    b/l; dx'ed May '22   Asthma    Complication of anesthesia    Anesthesia told her years ago she got too cold during surgery   Diabetes mellitus without complication ([place])    Emphysema    Fibromyalgia    Osteoporosis    Rheumatoid arthritis(714.0)    Sleep apnea    no cpap    Patient Active Problem List   Diagnosis Date Noted   GI bleed 08/20/2020   Acute lower GI bleeding 08/19/2020    Acute pulmonary embolism ([place]) 07/21/2020   Hypoxia 05/23/2020   Acute hypoxemic respiratory failure ([place]) 05/22/2020   Rotator cuff arthropathy of left shoulder 12/13/2019   Pain in joint of right shoulder 12/10/2019   Pain in joint of left shoulder 12/10/2019   Hearing loss, sensorineural, 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   Tinnitus, left 01/08/2019   DM type 2, not at goal [facility]) 06/03/2018   Frequent infections 05/21/2018   Gangrene ([place]) 05/21/2018   Pain in finger 05/07/2018   Family history of aneurysm 05/07/2018   Diabetes mellitus due to underlying condition, controlled, with complication, without long-term current use of insulin ([place])    Lower urinary tract infectious disease    Palpitations 01/17/2018   Dehydration 01/17/2018   Hypotension 01/17/2018   Symptom of blood in stool 01/17/2018   Diarrhea in adult patient 01/17/2018   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   Aftercare following joint replacement 08/21/2017   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 with presence of antibiotic-impregnated cement spacer 06/05/2017   Closed displaced transverse fracture of right patella 05/06/2017   Anemia 04/16/2017   Chronic interstitial lung disease ([place]) 04/16/2017   Chronic kidney disease (CKD), stage III (moderate) ([place]) 04/16/2017   Other hyperlipidemia 04/16/2017   Type 2 diabetes mellitus without complication, without long-term current use of insulin ([place]) 04/16/2017   Pain in right  knee 03/25/2017   Aftercare 03/25/2017   Arthritis, septic, knee ([place]) 03/09/2017   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   Fibromyalgia 06/04/2016   High risk medication use 06/04/2016   Claw toe, acquired, left 01/29/2016   Rheumatoid arthritis involving both feet with negative rheumatoid factor ([place]) 01/29/2016   Septic prepatellar bursitis of right knee 10/26/2014   Prepatellar bursitis of right knee 08/28/2014   DYSPHAGIA ORAL PHASE  06/18/2007   Asthma 05/14/2007   SLEEP APNEA 05/14/2007   Sleep apnea 05/14/2007   GERD 05/13/2007   Rheumatoid arthritis ([place]) 05/13/2007   OSTEOPOROSIS 05/13/2007   DIARRHEA, CHRONIC 05/13/2007    Past Surgical History:  Procedure Laterality Date   ABDOMINAL HYSTERECTOMY     ANKLE FRACTURE SURGERY  2007   BREAST SURGERY     mammoplasty and then removed   COLONOSCOPY WITH PROPOFOL N/A 08/22/2020   Procedure: COLONOSCOPY WITH PROPOFOL;  Surgeon: Ronnette Juniper, MD;  Location: [facility];  Service: Gastroenterology;  Laterality: N/A;  If schedule availability permits, would like to add on upper endoscopy as well   ESOPHAGOGASTRODUODENOSCOPY (EGD) WITH PROPOFOL N/A 08/22/2020   Procedure: ESOPHAGOGASTRODUODENOSCOPY (EGD) WITH PROPOFOL;  Surgeon: Ronnette Juniper, MD;  Location: [facility];  Service: Gastroenterology;  Laterality: N/A;   FOOT SURGERY     numerous   HOT HEMOSTASIS N/A 08/22/2020   Procedure: HOT HEMOSTASIS (ARGON PLASMA COAGULATION/BICAP);  Surgeon: Ronnette Juniper, MD;  Location: [facility];  Service: Gastroenterology;  Laterality: N/A;   I & D EXTREMITY Right 10/26/2014   Procedure: IRRIGATION AND DEBRIDEMENT RIGHT KNEE ;  Surgeon: Gaynelle Arabian, MD;  Location: [facility];  Service: Orthopedics;  Laterality: Right;   IRRIGATION AND DEBRIDEMENT KNEE Right 03/09/2017   Procedure: IRRIGATION AND DEBRIDEMENT RIGHT KNEE PRE-PATELLAR BURSA;  Surgeon: Susa Day, MD;  Location: [facility];  Service: Orthopedics;  Laterality: Right;   KNEE BURSECTOMY Right 09/16/2014   Procedure: EXCISON OF PRE PATELLA BURSA RIGHT KNEE ;  Surgeon: Gaynelle Arabian, MD;  Location: [facility];  Service: Orthopedics;  Laterality: Right;   PARTIAL HYSTERECTOMY  1970   POLYPECTOMY  08/22/2020   Procedure: POLYPECTOMY;  Surgeon: Ronnette Juniper, MD;  Location: [facility];  Service: Gastroenterology;;   TOTAL KNEE ARTHROPLASTY  2005 / 2006   x2     OB History   No obstetric history on file.     Family History   Problem Relation Age of Onset   Hypertension Other    Diabetes Other    Stroke Other    Diabetes Mother    Diabetes Father    Diabetes Sister    Diabetes Maternal Aunt    Cancer Paternal Uncle    Cancer Paternal Grandmother    Breast cancer Paternal Grandmother     Social History   Tobacco Use   Smoking status: Former    Types: Cigarettes    Quit date: 03/11/1994    Years since quitting: 26.6   Smokeless tobacco: Never  Vaping Use   Vaping Use: Never used  Substance Use Topics   Alcohol use: No   Drug use: No    Home Medications Prior to Admission medications   Medication Sig Start Date End Date Taking? Authorizing Provider  acetaminophen (TYLENOL) 500 MG tablet Take 1,000 mg by mouth every 6 (six) hours as needed (for headaches).    [provider]  albuterol (VENTOLIN HFA) 108 (90 Base) MCG/ACT inhaler Inhale 2 puffs into the lungs every 6 (six) hours as needed for shortness of breath or  wheezing. 07/05/19   [provider]  apixaban (ELIQUIS) 5 MG TABS tablet Take 1 tablet (5 mg total) by mouth 2 (two) times daily. Start taking only from tomorrow 08/22/20 10/10/20  Shelly Coss, MD  arformoterol (BROVANA) 15 MCG/2ML NEBU Take 15 mcg by nebulization daily as needed (For shortness of breath).    [provider]  atorvastatin (LIPITOR) 10 MG tablet Take 10 mg by mouth at bedtime.    [provider]  B-D UF III MINI PEN NEEDLES 31G X 5 MM MISC SMARTSIG:1 Each SUB-Q 4 Times Daily 10/22/19   [provider]  budesonide (PULMICORT) 0.25 MG/2ML nebulizer solution Take 2 mLs (0.25 mg total) by nebulization 2 (two) times daily. 05/26/20 09/14/20  [name], MD  Cyanocobalamin (B-12) 2500 MCG TABS Take 2,500 mcg by mouth at bedtime.     [provider]  DULoxetine (CYMBALTA) 60 MG capsule Take 60 mg by mouth at bedtime.     [provider]  ezetimibe (ZETIA) 10 MG tablet Take 10 mg by mouth at bedtime.  11/04/16   [provider]  ferrous gluconate (FERGON) 324 MG tablet Take 324 mg by mouth every evening.    [provider]  insulin NPH-regular Human (70-30) 100 UNIT/ML injection Inject 14 Units into the skin 2 (two) times daily with a meal.    [provider]  ipratropium-albuterol (DUONEB) 0.5-2.5 (3) MG/3ML SOLN Take 3 mLs by nebulization every 6 (six) hours as needed (wheezing). 07/19/20   [provider]  metFORMIN (GLUCOPHAGE) 1000 MG tablet Take 1,000 mg by mouth at bedtime.    [provider]  pantoprazole (PROTONIX) 40 MG tablet Take one tablet once every morning Patient taking differently: Take 40 mg by mouth daily. 05/26/20   [name], MD  predniSONE (DELTASONE) 5 MG tablet Take 5 mg by mouth at bedtime.    [provider]  tolterodine (DETROL LA) 2 MG 24 hr capsule Take 2 mg by mouth every evening. 08/15/20   [provider]  MYRBETRIQ 50 MG TB24 tablet Take 50 mg by mouth at bedtime. Patient not taking: Reported on 08/19/2020 03/29/20 08/19/20  [provider]    Allergies    Penicillins, Sulfamethoxazole-trimethoprim, Meperidine, Meperidine hcl, Penicillin g sodium, Sulfa antibiotics, and Sulfonamide derivatives  Review of Systems   Review of Systems  HENT: Negative.    Respiratory: Negative.    Cardiovascular: Negative.   Gastrointestinal:  Positive for abdominal pain, blood in stool, diarrhea, nausea and vomiting. Negative for abdominal distention, anal bleeding, constipation and rectal pain.  Genitourinary: Negative.   Musculoskeletal: Negative.   Skin: Negative.   Neurological:  Positive for weakness and light-headedness.  All other systems reviewed and are negative.  Physical Exam Updated Vital Signs BP 117/62   Pulse 95   Temp 98 F (36.7 C) (Oral)   Resp 15   SpO2 100%   Physical Exam Vitals and nursing note reviewed.  Constitutional:      General: She is not in acute distress.    Appearance: She is  well-developed. She is ill-appearing (Chronically appearing). She is not toxic-appearing or diaphoretic.  HENT:     Head: Normocephalic and atraumatic.     Nose: Nose normal.     Mouth/Throat:     Mouth: Mucous membranes are moist.  Eyes:     Pupils: Pupils are equal, round, and reactive to light.  Cardiovascular:     Rate and Rhythm: Normal rate.  Pulses: Normal pulses.     Heart sounds: Normal heart sounds.  Pulmonary:     Effort: Pulmonary effort is normal. No respiratory distress.     Breath sounds: Normal breath sounds.  Abdominal:     General: Bowel sounds are normal. There is no distension.     Palpations: Abdomen is soft.     Tenderness: There is abdominal tenderness.  Genitourinary:    Comments: NO stool in rectal vault. Non tender Musculoskeletal:        General: Deformity present. No swelling, tenderness or signs of injury. Normal range of motion.     Cervical back: Normal range of motion.     Right lower leg: No edema.     Left lower leg: No edema.     Comments: Contractions to extremities  Skin:    General: Skin is warm and dry.     Capillary Refill: Capillary refill takes less than 2 seconds.  Neurological:     General: No focal deficit present.     Mental Status: She is alert.  Psychiatric:        Mood and Affect: Mood normal.    ED Results / Procedures / Treatments   Labs (all labs ordered are listed, but only abnormal results are displayed) Labs Reviewed  CBC WITH DIFFERENTIAL/PLATELET - Abnormal; Notable for the following components:      Result Value   WBC 10.8 (*)    RBC 3.32 (*)    Hemoglobin 7.2 (*)    HCT 25.9 (*)    MCV 78.0 (*)    MCH 21.7 (*)    MCHC 27.8 (*)    RDW 17.5 (*)    Platelets 556 (*)    Neutro Abs 8.8 (*)    Abs Immature Granulocytes 0.09 (*)    All other components within normal limits  COMPREHENSIVE METABOLIC PANEL - Abnormal; Notable for the following components:   Glucose, Bld 133 (*)    BUN 28 (*)    Creatinine,  Ser 1.21 (*)    Albumin 3.0 (*)    GFR, Estimated 46 (*)    All other components within normal limits  CK - Abnormal; Notable for the following components:   Total CK 19 (*)    All other components within normal limits  RESP PANEL BY RT-PCR (FLU A&B, COVID) ARPGX2  LIPASE, BLOOD  URINALYSIS, ROUTINE W REFLEX MICROSCOPIC  POC OCCULT BLOOD, ED  TYPE AND SCREEN  PREPARE RBC (CROSSMATCH)    EKG EKG Interpretation  Date/Time:  Friday October 27 2020 13:05:58 EDT Ventricular Rate:  102 PR Interval:  203 QRS Duration: 118 QT Interval:  411 QTC Calculation: 489 R Axis:   -41 Text Interpretation: Sinus tachycardia Atrial premature complexes Nonspecific IVCD with LAD Left ventricular hypertrophy No significant change since last tracing Confirmed by Calvert Cantor [phone]) on 10/27/2020 2:15:32 PM  Radiology DG Chest Portable 1 View  Result Date: 10/27/2020 CLINICAL DATA:  Cough, weakness and generalized pain. Vomiting for 4 days. EXAM: PORTABLE CHEST 1 VIEW COMPARISON:  October 10, 2020. FINDINGS: EKG leads project over the chest. Cardiomediastinal contours are stable likely with mild cardiac enlargement. Central pulmonary vascular congestion is a stable finding without signs of edema, consolidation or evidence of pleural effusion. No visible pneumothorax. On limited assessment no acute skeletal process. IMPRESSION: Stable mild cardiac enlargement and central pulmonary vascular congestion without acute process. Electronically Signed   By: Zetta Bills M.D.   On: 10/27/2020 14:54    Procedures .Critical  Care  Date/Time: 10/27/2020 3:20 PM Performed by: Nettie Elm, PA-C Authorized by: Nettie Elm, PA-C   Critical care provider statement:    Critical care time (minutes):  45   Critical care was necessary to treat or prevent imminent or life-threatening deterioration of the following conditions:  Circulatory failure   Critical care was time spent personally by me on the  following activities:  Discussions with consultants, evaluation of patient's response to treatment, examination of patient, ordering and performing treatments and interventions, ordering and review of laboratory studies, ordering and review of radiographic studies, pulse oximetry, re-evaluation of patient's condition, obtaining history from patient or surrogate and review of old charts   Medications Ordered in ED Medications  0.9 %  sodium chloride infusion (has no administration in time range)  pantoprazole (PROTONIX) 80 mg /NS 100 mL IVPB (has no administration in time range)  pantoprozole (PROTONIX) 80 mg /NS 100 mL infusion (has no administration in time range)  pantoprazole (PROTONIX) injection 40 mg (has no administration in time range)  iohexol (OMNIPAQUE) 350 MG/ML injection 100 mL (has no administration in time range)    ED Course  I have reviewed the triage vital signs and the nursing notes.  Pertinent labs & imaging results that were available during my care of the patient were reviewed by me and considered in my medical decision making (see chart for details).  Here for evaluation of anemia with associated weakness.  Seen by PCP earlier today noted to be anemic.  She has had a three-point hemoglobin drop over the last month.  Was seen 2 months ago for upper GI bleed, was scoped at that time.  She is having recurrent dark, tarry stools.  She has some generalized abdominal pain as well as intermittent NBNB emesis.  Nonproductive cough.  Has generalized weakness.  She is not ambulatory at baseline, chronically wheelchair-bound.  Plan on labs, imaging and reassess  Labs and imaging personally reviewed and interpreted:  CBC with leukocytosis at 10.8, hemoglobin seven-point baseline 9-10. CMP glucose 133, BUN 28, creatinine 1.21 CK19 Lipase 28 Occult negative however no stool in vault Chest x-ray without acute abnormality  Given persistent symptomatic anemia we will transfuse.  We  will also add Protonix drip given likely upper GI bleed.  Given she has abdominal tenderness will obtain CT abdomen pelvis  Care transferred to [facility] [place] who will follow up on imaging. Will likely need consults with GI and admit to hospitalist    MDM Rules/Calculators/A&P                            Final Clinical Impression(s) / ED Diagnoses Final diagnoses:  Weakness  Symptomatic anemia  Gastrointestinal hemorrhage with melena  Generalized abdominal pain    Rx / DC Orders ED Discharge Orders     None        Cherri Yera A, PA-C 10/27/20 1522    Truddie Hidden, MD 10/30/20 1014

## 2020-10-27 NOTE — ED Triage Notes (Signed)
Patient presents with weakness, generalized pain, vomiting. She reports being told by her MD she has low hemoglobin.

## 2020-10-28 ENCOUNTER — Encounter (HOSPITAL_COMMUNITY): Payer: Self-pay | Admitting: Family Medicine

## 2020-10-28 ENCOUNTER — Encounter (HOSPITAL_COMMUNITY)
Admission: EM | Disposition: A | Discharge status: Home / Self Care | Payer: Self-pay | Source: Home / Self Care | Attending: Family Medicine

## 2020-10-28 DIAGNOSIS — I700 Atherosclerosis of aorta: Secondary | ICD-10-CM

## 2020-10-28 DIAGNOSIS — K921 Melena: Secondary | ICD-10-CM

## 2020-10-28 HISTORY — PX: GIVENS CAPSULE STUDY: SHX5432

## 2020-10-28 LAB — BASIC METABOLIC PANEL
Anion gap: 7 (ref 5–15)
BUN: 20 mg/dL (ref 8–23)
CO2: 22 mmol/L (ref 22–32)
Calcium: 8.4 mg/dL — ABNORMAL LOW (ref 8.9–10.3)
Chloride: 108 mmol/L (ref 98–111)
Creatinine, Ser: 0.91 mg/dL (ref 0.44–1.00)
GFR, Estimated: >60 mL/min (ref >60)
Glucose, Bld: 174 mg/dL — ABNORMAL HIGH (ref 70–99)
Potassium: 4.2 mmol/L (ref 3.5–5.1)
Sodium: 137 mmol/L (ref 135–145)

## 2020-10-28 LAB — CBC
HCT: 27.2 % — ABNORMAL LOW (ref 36.0–46.0)
HCT: 26.2 % — ABNORMAL LOW (ref 36.0–46.0)
Hemoglobin: 7.9 g/dL — ABNORMAL LOW (ref 12.0–15.0)
Hemoglobin: 7.7 g/dL — ABNORMAL LOW (ref 12.0–15.0)
MCH: 23 pg — ABNORMAL LOW (ref 26.0–34.0)
MCH: 22.8 pg — ABNORMAL LOW (ref 26.0–34.0)
MCHC: 29 g/dL — ABNORMAL LOW (ref 30.0–36.0)
MCHC: 29.4 g/dL — ABNORMAL LOW (ref 30.0–36.0)
MCV: 79.3 fL — ABNORMAL LOW (ref 80.0–100.0)
MCV: 77.7 fL — ABNORMAL LOW (ref 80.0–100.0)
Platelets: 462 10*3/uL — ABNORMAL HIGH (ref 150–400)
Platelets: 426 10*3/uL — ABNORMAL HIGH (ref 150–400)
RBC: 3.43 MIL/uL — ABNORMAL LOW (ref 3.87–5.11)
RBC: 3.37 MIL/uL — ABNORMAL LOW (ref 3.87–5.11)
RDW: 18.2 % — ABNORMAL HIGH (ref 11.5–15.5)
RDW: 18.3 % — ABNORMAL HIGH (ref 11.5–15.5)
WBC: 9.5 10*3/uL (ref 4.0–10.5)
WBC: 8 10*3/uL (ref 4.0–10.5)
nRBC: 0 % (ref 0.0–0.2)
nRBC: 0 % (ref 0.0–0.2)

## 2020-10-28 SURGERY — IMAGING PROCEDURE, GI TRACT, INTRALUMINAL, VIA CAPSULE
Anesthesia: LOCAL

## 2020-10-28 MED ORDER — SODIUM CHLORIDE 0.9 % IV SOLN: INTRAVENOUS | Status: DC

## 2020-10-28 MED ORDER — POLYVINYL ALCOHOL 1.4 % OP SOLN
1.0000 [drp] | OPHTHALMIC | Status: DC | PRN
  Administered 2020-10-28 – 2020-10-29 (×2): 1 [drp] via OPHTHALMIC
  Filled 2020-10-28 (×2): qty 15

## 2020-10-28 SURGICAL SUPPLY — 1 items: TOWEL COTTON PACK 4EA (MISCELLANEOUS) ×4 IMPLANT

## 2020-10-28 NOTE — Plan of Care (Signed)
  Problem: Education: Goal: Ability to identify signs and symptoms of gastrointestinal bleeding will improve Outcome: Progressing   Problem: Bowel/Gastric: Goal: Will show no signs and symptoms of gastrointestinal bleeding Outcome: Progressing   Problem: Fluid Volume: Goal: Will show no signs and symptoms of excessive bleeding Outcome: Progressing   Problem: Clinical Measurements: Goal: Complications related to the disease process, condition or treatment will be avoided or minimized Outcome: Progressing   Problem: Education: Goal: Knowledge of General Education information will improve Description: Including pain rating scale, medication(s)/side effects and non-pharmacologic comfort measures Outcome: Progressing   Problem: Clinical Measurements: Goal: Ability to maintain clinical measurements within normal limits will improve Outcome: Progressing   Problem: Activity: Goal: Risk for activity intolerance will decrease Outcome: Progressing   Problem: Nutrition: Goal: Adequate nutrition will be maintained Outcome: Progressing   Problem: Elimination: Goal: Will not experience complications related to bowel motility Outcome: Progressing Goal: Will not experience complications related to urinary retention Outcome: Progressing   Problem: Pain Managment: Goal: General experience of comfort will improve Outcome: Progressing   Problem: Safety: Goal: Ability to remain free from injury will improve Outcome: Progressing

## 2020-10-28 NOTE — Assessment & Plan Note (Addendum)
--  CTA chest August 2 showed resolution of thrombus --No previous history of DVT.  -- Discussed with pulmonologist Dr. Silas Flood, we will permanently discontinue apixaban.

## 2020-10-28 NOTE — Assessment & Plan Note (Signed)
--  w/ multiple severe joint deformities --continue low-dose prednisone

## 2020-10-28 NOTE — Hospital Course (Addendum)
80 year old woman PMH PE May 2021 treated with apixaban, GI bleed in June 2020 on apixaban, rheumatoid arthritis on low-dose prednisone presented with melena, generalized weakness, several weeks nausea and vomiting.  Admitted for acute blood loss anemia, suspected upper GI bleed.  GI following, underwent capsule endoscopy with results pending.  Will not resume anticoagulation.  Home when cleared by GI.

## 2020-10-28 NOTE — Assessment & Plan Note (Addendum)
--  Status post EGD and colonoscopy June 14 showing gastric polyps, nonbleeding duodenal AVM treated with cautery.  Colonic polyps, diverticulosis.  Recent CT negative for obstruction or acute changes. --Eliquis appropriately discontinued --Hemoglobin up status post 1 unit PRBC, and trending upwards. --Continue PPI --Capsule endoscopy 8/20.  Await report.

## 2020-10-28 NOTE — Assessment & Plan Note (Signed)
--  Incidental finding, follow-up in 2 years with abdominal MRI with and without contrast

## 2020-10-28 NOTE — Progress Notes (Addendum)
PROGRESS NOTE  [name] [id number] DOB: 1940/05/31 DOA: 10/27/2020 PCP: [name], MD  Brief History   80 year old woman PMH PE May 2021 treated with apixaban, GI bleed in June 2020 on apixaban, rheumatoid arthritis on low-dose prednisone presented with melena, generalized weakness, several weeks nausea and vomiting.  Admitted for acute blood loss anemia, suspected upper GI bleed.  A & P  * Acute blood loss anemia --Status post EGD and colonoscopy June 14 showing gastric polyps, nonbleeding duodenal AVM treated with cautery.  Colonic polyps, diverticulosis.  Recent CT negative for obstruction or acute changes. --Eliquis on hold --Hemoglobin up status post 1 unit PRBC, transfuse for hemoglobin less than 7 --Continue IV PPI --Capsule endoscopy today per gastroenterology.  Type 2 diabetes mellitus without complication, with long-term current use of insulin (HCC) --SSI, resume long-acting when taking PO, can resume metformin on discharge  Rheumatoid arthritis ([place]) --w/ multiple severe joint deformities --continue low-dose prednisone  Aortic atherosclerosis ([place]) --continue atorvastatin  History of pulmonary embolism --CTA chest August 2 showed resolution of thrombus --No previous history of DVT.  We will stop apixaban given recurrent bleed.  Evaluate risk and benefit towards end of hospitalization.  Pancreatic cyst --Incidental finding, follow-up in 2 years with abdominal MRI with and without contrast  Disposition Plan:  Discussion:   Status is: Inpatient  Remains inpatient appropriate because:IV treatments appropriate due to intensity of illness or inability to take PO and Inpatient level of care appropriate due to severity of illness  Dispo: The patient is from: Home              Anticipated d/c is to: Home              Patient currently is not medically stable to d/c.   Difficult to place patient No  DVT prophylaxis: SCDs Start: 10/27/20 1926   Code Status: Full  Code Level of care: Progressive Family Communication: none  Murray Hodgkins, MD  [facility] Direct contact: see [email or web address] (further directions at bottom of note if needed) 7PM-7AM contact night coverage as at bottom of note 10/28/2020, 3:04 PM  LOS: 1 day   Consults:  GI   Procedures:  Capsule endoscopy  Interval History/Subjective  CC: f/u anemia  Feels ok, has had dark stools at home No complaints now  Objective   Vitals:  Vitals:   10/28/20 0413 10/28/20 1028  BP: 127/70 127/75  Pulse: 85 85  Resp: 19 14  Temp: 98.1 F (36.7 C) 97.8 F (36.6 C)  SpO2: 90% 94%    Exam: Physical Exam Constitutional:      General: She is not in acute distress.    Appearance: She is ill-appearing. She is not toxic-appearing.  Cardiovascular:     Rate and Rhythm: Normal rate and regular rhythm.     Heart sounds: No murmur heard. Pulmonary:     Effort: Pulmonary effort is normal.     Breath sounds: Normal breath sounds. No wheezing.  Musculoskeletal:     Comments: Multiple joint deformities  Neurological:     Mental Status: She is alert.  Psychiatric:        Mood and Affect: Mood normal.        Behavior: Behavior normal.    I have personally reviewed the labs and other data, making special note of:   Today's Data  Hgb 7.7, stable   Scheduled Meds:  atorvastatin  20 mg Oral Daily   budesonide  0.25 mg Nebulization BID   DULoxetine  60 mg Oral QHS   ezetimibe  10 mg Oral QHS   ferrous gluconate  324 mg Oral QODAY   fesoterodine  4 mg Oral Daily   [START ON 10/31/2020] pantoprazole  40 mg Intravenous Q12H   predniSONE  5 mg Oral QHS   vitamin B-12  2,500 mcg Oral QHS   Continuous Infusions:  sodium chloride     pantoprazole 8 mg/hr (10/27/20 2149)    Principal Problem:   Acute blood loss anemia Active Problems:   Rheumatoid arthritis (HCC)   Type 2 diabetes mellitus without complication, with long-term current use of insulin (HCC)   Asthma   Acute  lower GI bleeding   AKI (acute kidney injury) ([place])   Pancreatic cyst   History of pulmonary embolism   Aortic atherosclerosis ([place])   LOS: 1 day   How to contact the [facility] Attending or Consulting provider 7A - 7P or covering provider during after hours [place], for this patient?  Check the care team in [facility] and look for a) attending/consulting TRH provider listed and b) the [facility] team listed Log into [email or web address] and use [place]'s universal password to access. If you do not have the password, please contact the hospital operator. Locate the [facility] provider you are looking for under [facility] and page to a number that you can be directly reached. If you still have difficulty reaching the provider, please page the [facility] (Director on Call) for the Hospitalists listed on amion for assistance.

## 2020-10-28 NOTE — Progress Notes (Signed)
Capsule endoscopy ordered by Dr. Otis Brace, MD. Consent and pt identity were verified. Pt ingested the capsule at 1308. Pt and floor RN provided capsule endoscopy instructions. Pt and floor RN verbalized understanding.  Debarah Crape, BSN, RN

## 2020-10-28 NOTE — Plan of Care (Signed)
  Problem: Fluid Volume: Goal: Will show no signs and symptoms of excessive bleeding Outcome: Progressing   

## 2020-10-28 NOTE — Assessment & Plan Note (Signed)
-   continue atorvastatin

## 2020-10-28 NOTE — Consult Note (Signed)
Referring Provider:  TH/ED Primary Care Physician:  [name], MD Primary Gastroenterologist:  Dr. Cristina Gong   Reason for Consultation: Symptomatic anemia  HPI: [name] is a 80 y.o. female with past medical history of PE diagnosed in May 2022 currently on Eliquis, history of rheumatoid arthritis on chronic low-dose prednisone, history of diabetes and recent history of GI bleed was told to come to the hospital for low hemoglobin and weakness.  Patient seen and examined at bedside.  She denies seeing any black tarry stool or bright blood in the stool.  Denies abdominal pain, nausea or vomiting.  Her  hemoglobin on discharge was 9.5.  Hemoglobin on July 7 was 10.3 and hemoglobin in August 2 was 8.6.  Hemoglobin on admission yesterday was 7.2.  Normal LFTs.  Normal lipase.  Occult blood negative.  Previous GI work-up  She was admitted in the hospital in June 2022 with GI bleed.  Underwent EGD and colonoscopy in August 22, 2020.    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  Biopsy showed hyperplastic polyps.  Colonoscopy showed rectal prolapse, diverticulosis and small tubular adenoma in the descending colon.   Past Medical History:  Diagnosis Date   Acute pulmonary embolism ([place])    b/l; dx'ed May '22   Asthma    Complication of anesthesia    Anesthesia told her years ago she got too cold during surgery   Diabetes mellitus without complication ([place])    Emphysema    Fibromyalgia    Osteoporosis    Rheumatoid arthritis(714.0)    Sleep apnea    no cpap    Past Surgical History:  Procedure Laterality Date   ABDOMINAL HYSTERECTOMY     ANKLE FRACTURE SURGERY  2007   BREAST SURGERY     mammoplasty and then removed   COLONOSCOPY WITH PROPOFOL N/A 08/22/2020   Procedure: COLONOSCOPY WITH PROPOFOL;  Surgeon: Ronnette Juniper, MD;  Location: [facility];  Service: Gastroenterology;  Laterality:  N/A;  If schedule availability permits, would like to add on upper endoscopy as well   ESOPHAGOGASTRODUODENOSCOPY (EGD) WITH PROPOFOL N/A 08/22/2020   Procedure: ESOPHAGOGASTRODUODENOSCOPY (EGD) WITH PROPOFOL;  Surgeon: Ronnette Juniper, MD;  Location: [facility];  Service: Gastroenterology;  Laterality: N/A;   FOOT SURGERY     numerous   HOT HEMOSTASIS N/A 08/22/2020   Procedure: HOT HEMOSTASIS (ARGON PLASMA COAGULATION/BICAP);  Surgeon: Ronnette Juniper, MD;  Location: [facility];  Service: Gastroenterology;  Laterality: N/A;   I & D EXTREMITY Right 10/26/2014   Procedure: IRRIGATION AND DEBRIDEMENT RIGHT KNEE ;  Surgeon: Gaynelle Arabian, MD;  Location: [facility];  Service: Orthopedics;  Laterality: Right;   IRRIGATION AND DEBRIDEMENT KNEE Right 03/09/2017   Procedure: IRRIGATION AND DEBRIDEMENT RIGHT KNEE PRE-PATELLAR BURSA;  Surgeon: Susa Day, MD;  Location: [facility];  Service: Orthopedics;  Laterality: Right;   KNEE BURSECTOMY Right 09/16/2014   Procedure: EXCISON OF PRE PATELLA BURSA RIGHT KNEE ;  Surgeon: Gaynelle Arabian, MD;  Location: [facility];  Service: Orthopedics;  Laterality: Right;   PARTIAL HYSTERECTOMY  1970   POLYPECTOMY  08/22/2020   Procedure: POLYPECTOMY;  Surgeon: Ronnette Juniper, MD;  Location: [facility];  Service: Gastroenterology;;   TOTAL KNEE ARTHROPLASTY  2005 / 2006   x2    Prior to Admission medications   Medication Sig Start Date End Date Taking? Authorizing Provider  acetaminophen (TYLENOL) 500 MG tablet Take 1,000 mg by  mouth every 6 (six) hours as needed for mild pain (or headaches).   Yes [provider]  albuterol (VENTOLIN HFA) 108 (90 Base) MCG/ACT inhaler Inhale 2 puffs into the lungs every 6 (six) hours as needed for shortness of breath or wheezing. 07/05/19  Yes [provider]  apixaban (ELIQUIS) 5 MG TABS tablet Take 1 tablet (5 mg total) by mouth 2 (two) times daily. Start taking only from tomorrow Patient taking differently: Take 5 mg by mouth 2  (two) times daily. 08/22/20 10/27/21 Yes Shelly Coss, MD  arformoterol (BROVANA) 15 MCG/2ML NEBU Take 15 mcg by nebulization 2 (two) times daily as needed (for shortness of breath).   Yes [provider]  atorvastatin (LIPITOR) 20 MG tablet Take 20 mg by mouth daily. 10/23/20  Yes [provider]  budesonide (PULMICORT) 0.25 MG/2ML nebulizer solution Take 2 mLs (0.25 mg total) by nebulization 2 (two) times daily. 05/26/20 10/27/21 Yes [name], MD  Cyanocobalamin (B-12) 2500 MCG TABS Take 2,500 mcg by mouth at bedtime.    Yes [provider]  DULoxetine (CYMBALTA) 60 MG capsule Take 60 mg by mouth at bedtime.    Yes [provider]  ezetimibe (ZETIA) 10 MG tablet Take 10 mg by mouth at bedtime.  11/04/16  Yes [provider]  ferrous gluconate (FERGON) 324 MG tablet Take 324 mg by mouth every other day.   Yes [provider]  ipratropium-albuterol (DUONEB) 0.5-2.5 (3) MG/3ML SOLN Take 3 mLs by nebulization every 6 (six) hours as needed (wheezing). 07/19/20  Yes [provider]  metFORMIN (GLUCOPHAGE) 500 MG tablet Take 500 mg by mouth 2 (two) times daily. 10/25/20  Yes [provider]  NOVOLIN 70/30 KWIKPEN (70-30) 100 UNIT/ML KwikPen Inject 14 Units into the skin 2 (two) times daily. 10/20/20  Yes [provider]  ondansetron (ZOFRAN-ODT) 8 MG disintegrating tablet Take 8 mg by mouth every 8 (eight) hours as needed for nausea or vomiting (dissolve orally). 10/24/20  Yes [provider]  pantoprazole (PROTONIX) 40 MG tablet Take one tablet once every morning Patient taking differently: Take 40 mg by mouth daily before breakfast. 05/26/20  Yes Kc, Ramesh, MD  predniSONE (DELTASONE) 5 MG tablet Take 5 mg by mouth at bedtime.   Yes [provider]  tolterodine (DETROL LA) 2 MG 24 hr capsule Take 2 mg by mouth every evening. 08/15/20  Yes [provider]  B-D UF III MINI PEN NEEDLES 31G X 5 MM MISC  SMARTSIG:1 Each SUB-Q 4 Times Daily 10/22/19   [provider]  MYRBETRIQ 50 MG TB24 tablet Take 50 mg by mouth at bedtime. Patient not taking: Reported on 08/19/2020 03/29/20 08/19/20  [provider]    Scheduled Meds:  atorvastatin  20 mg Oral Daily   budesonide  0.25 mg Nebulization BID   DULoxetine  60 mg Oral QHS   ezetimibe  10 mg Oral QHS   ferrous gluconate  324 mg Oral QODAY   fesoterodine  4 mg Oral Daily   [START ON 10/31/2020] pantoprazole  40 mg Intravenous Q12H   predniSONE  5 mg Oral QHS   vitamin B-12  2,500 mcg Oral QHS   Continuous Infusions:  pantoprazole 8 mg/hr (10/27/20 2149)   PRN Meds:.albuterol  Allergies as of 10/27/2020 - Review Complete 10/27/2020  Allergen Reaction Noted   Penicillins Hives    Sulfamethoxazole-trimethoprim Nausea And Vomiting and Other (See Comments) 08/21/2017   Adalimumab Other (See Comments) 10/27/2020   Hydroxychloroquine sulfate  Other (See Comments) 10/27/2020   Meperidine Other (See Comments) 05/14/2018   Meperidine hcl Other (See Comments) 06/27/2020   Methotrexate Nausea And Vomiting and Other (See Comments) 10/27/2020   Penicillin g sodium Hives 06/27/2020   Phentermine-topiramate Nausea Only and Other (See Comments) 10/27/2020   Sulfa antibiotics Nausea And Vomiting and Other (See Comments) 05/14/2018   Sulfonamide derivatives Nausea And Vomiting and Other (See Comments)     Family History  Problem Relation Age of Onset   Hypertension Other    Diabetes Other    Stroke Other    Diabetes Mother    Diabetes Father    Diabetes Sister    Diabetes Maternal Aunt    Cancer Paternal Uncle    Cancer Paternal Grandmother    Breast cancer Paternal Grandmother     Social History   Socioeconomic History   Marital status: Divorced    Spouse name: Not on file   Number of children: Not on file   Years of education: Not on file   Highest education level: Not on file  Occupational History   Not on file   Tobacco Use   Smoking status: Former    Types: Cigarettes    Quit date: 03/11/1994    Years since quitting: 26.6   Smokeless tobacco: Never  Vaping Use   Vaping Use: Never used  Substance and Sexual Activity   Alcohol use: No   Drug use: No   Sexual activity: Not on file  Other Topics Concern   Not on file  Social History Narrative   Not on file   Social Determinants of Health   Financial Resource Strain: Not on file  Food Insecurity: No Food Insecurity   Worried About Charity fundraiser in the Last Year: Never true   Ran Out of Food in the Last Year: Never true  Transportation Needs: Unmet Transportation Needs   Lack of Transportation (Medical): Yes   Lack of Transportation (Non-Medical): Yes  Physical Activity: Not on file  Stress: Not on file  Social Connections: Not on file  Intimate Partner Violence: Not on file    Review of Systems: All negative except as stated above in HPI.  Physical Exam: Vital signs: Vitals:   10/28/20 0031 10/28/20 0413  BP: 123/62 127/70  Pulse: 60 85  Resp: 18 19  Temp: 97.6 F (36.4 C) 98.1 F (36.7 C)  SpO2: 99% 90%     General:   Alert,  Well-developed, well-nourished, pleasant and cooperative in NAD HEENT : Normocephalic, atraumatic, extraocular movement intact Lungs: No visible respiratory distress Heart:  Regular rate and rhythm; no murmurs, clicks, rubs,  or gallops. Abdomen: Soft, nontender, nondistended, bowel sounds present.  No peritoneal signs Neuro : Alert and oriented x3 Psych : Mood and affect normal Rectal:  Deferred  GI:  Lab Results: Recent Labs    10/27/20 1326 10/28/20 0258  WBC 10.8* 9.5  HGB 7.2* 7.9*  HCT 25.9* 27.2*  PLT 556* 462*   BMET Recent Labs    10/27/20 1326 10/28/20 0258  NA 137 137  K 4.6 4.2  CL 107 108  CO2 22 22  GLUCOSE 133* 174*  BUN 28* 20  CREATININE 1.21* 0.91  CALCIUM 9.2 8.4*   LFT Recent Labs    10/27/20 1326  PROT 6.8  ALBUMIN 3.0*  AST 22  ALT 11   ALKPHOS 77  BILITOT 0.5   PT/INR No results for input(s): LABPROT, INR in the last 72 hours.  Studies/Results: CT Abdomen Pelvis W Contrast  Result Date: 10/27/2020 CLINICAL DATA:  Generalized abdominal pain.  Vomiting.  Diarrhea. EXAM: CT ABDOMEN AND PELVIS WITH CONTRAST TECHNIQUE: Multidetector CT imaging of the abdomen and pelvis was performed using the standard protocol following bolus administration of intravenous contrast. CONTRAST:  52m OMNIPAQUE IOHEXOL 350 MG/ML SOLN COMPARISON:  10/10/2020 FINDINGS: Lower Chest: No acute findings. Mild bilateral lower lobe bronchiectasis and pleural-parenchymal scarring. Hepatobiliary: No hepatic masses identified. Gallbladder is unremarkable. No evidence of biliary ductal dilatation. Pancreas: 11 mm simple appearing cyst in the pancreatic neck remains stable. No evidence of pancreatic ductal dilatation or peripancreatic inflammatory changes. Spleen: Within normal limits in size. Splenic cysts remain stable, largest measuring 5 cm. Adrenals/Urinary Tract: A few tiny sub-cm renal cysts are again noted. No masses identified. No evidence of ureteral calculi or hydronephrosis. Unremarkable unopacified urinary bladder. Stomach/Bowel: No evidence of obstruction, inflammatory process or abnormal fluid collections. Normal appendix visualized. Diverticulosis is seen mainly involving the sigmoid colon, however there is no evidence of diverticulitis. Vascular/Lymphatic: No pathologically enlarged lymph nodes. No acute vascular findings. Aortic atherosclerotic calcification noted. Reproductive: Prior hysterectomy noted. Adnexal regions are unremarkable in appearance. Other: Stable small bilateral inguinal hernias which contain only fat. Musculoskeletal: No suspicious bone lesions identified. Severe degenerative disc disease noted at L2-3, L3-4, and L4-5. IMPRESSION: No acute findings within the abdomen or pelvis. Colonic diverticulosis, without radiographic evidence of  diverticulitis. Stable 11 mm simple appearing cyst in pancreatic neck. Recommend continued imaging follow-up in 2 years, preferably with abdomen MRI without and with contrast. This recommendation follows ACR consensus guidelines: Management of Incidental Pancreatic Cysts: A White Paper of the ACR Incidental Findings Committee. [id number] Aortic Atherosclerosis (ICD10-I70.0). Electronically Signed   By: JMarlaine HindM.D.   On: 10/27/2020 17:30   DG Chest Portable 1 View  Result Date: 10/27/2020 CLINICAL DATA:  Cough, weakness and generalized pain. Vomiting for 4 days. EXAM: PORTABLE CHEST 1 VIEW COMPARISON:  October 10, 2020. FINDINGS: EKG leads project over the chest. Cardiomediastinal contours are stable likely with mild cardiac enlargement. Central pulmonary vascular congestion is a stable finding without signs of edema, consolidation or evidence of pleural effusion. No visible pneumothorax. On limited assessment no acute skeletal process. IMPRESSION: Stable mild cardiac enlargement and central pulmonary vascular congestion without acute process. Electronically Signed   By: GZetta BillsM.D.   On: 10/27/2020 14:54    Impression/Plan: Symptomatic anemia with hemoglobin of 7.2 on admission.  Occult blood negative -History of pulmonary embolism diagnosed in May 2022.  Was on Eliquis.  Last dose yesterday -Rheumatoid arthritis on chronic low-dose prednisone  Recommendations ----------------------- -Plan for capsule endoscopy today.  Recent CT abdomen pelvis with contrast negative for obstruction or any acute changes in her GI system.  Patient with no history of prior surgery or radiation to abdomen and pelvis.  Risk of capsule retention discussed with the patient.  She verbalized understanding. -Monitor H&H.  Transfuse if hemoglobin less than 7 -Okay to have clear liquid diet after capsule endoscopy -GI will follow    LOS: 1 day   POtis Brace MD, FACP 10/28/2020, 8:32  AM  Contact #  [phone]

## 2020-10-28 NOTE — Assessment & Plan Note (Addendum)
--  SSI, resume long-acting when blood sugars trend up, can resume metformin on discharge

## 2020-10-29 LAB — CBC
HCT: 29 % — ABNORMAL LOW (ref 36.0–46.0)
Hemoglobin: 8.5 g/dL — ABNORMAL LOW (ref 12.0–15.0)
MCH: 23.3 pg — ABNORMAL LOW (ref 26.0–34.0)
MCHC: 29.3 g/dL — ABNORMAL LOW (ref 30.0–36.0)
MCV: 79.5 fL — ABNORMAL LOW (ref 80.0–100.0)
Platelets: 488 10*3/uL — ABNORMAL HIGH (ref 150–400)
RBC: 3.65 MIL/uL — ABNORMAL LOW (ref 3.87–5.11)
RDW: 18.4 % — ABNORMAL HIGH (ref 11.5–15.5)
WBC: 7.3 10*3/uL (ref 4.0–10.5)
nRBC: 0 % (ref 0.0–0.2)

## 2020-10-29 LAB — GLUCOSE, CAPILLARY
Glucose-Capillary: 167 mg/dL — ABNORMAL HIGH (ref 70–99)
Glucose-Capillary: 140 mg/dL — ABNORMAL HIGH (ref 70–99)

## 2020-10-29 MED ORDER — ENSURE ENLIVE PO LIQD: 237.0000 mL | Freq: Two times a day (BID) | ORAL | Status: DC

## 2020-10-29 MED ORDER — OLOPATADINE HCL 0.1 % OP SOLN
1.0000 [drp] | Freq: Two times a day (BID) | OPHTHALMIC | Status: DC
  Administered 2020-10-29 – 2020-10-30 (×3): 1 [drp] via OPHTHALMIC
  Filled 2020-10-29: qty 5

## 2020-10-29 MED ORDER — ADULT MULTIVITAMIN W/MINERALS CH
1.0000 | ORAL_TABLET | Freq: Every day | ORAL | Status: DC
  Administered 2020-10-29 – 2020-10-30 (×2): 1 via ORAL
  Filled 2020-10-29 (×2): qty 1

## 2020-10-29 MED ORDER — INSULIN ASPART 100 UNIT/ML IJ SOLN: 0.0000 [IU] | Freq: Every day | INTRAMUSCULAR | Status: DC

## 2020-10-29 MED ORDER — INSULIN ASPART 100 UNIT/ML IJ SOLN
0.0000 [IU] | Freq: Three times a day (TID) | INTRAMUSCULAR | Status: DC
  Administered 2020-10-29: 2 [IU] via SUBCUTANEOUS
  Administered 2020-10-30: 1 [IU] via SUBCUTANEOUS

## 2020-10-29 NOTE — Progress Notes (Signed)
PROGRESS NOTE  [name] [id number] DOB: Aug 11, 1940 DOA: 10/27/2020 PCP: [name], MD  Brief History   80 year old woman PMH PE May 2021 treated with apixaban, GI bleed in June 2020 on apixaban, rheumatoid arthritis on low-dose prednisone presented with melena, generalized weakness, several weeks nausea and vomiting.  Admitted for acute blood loss anemia, suspected upper GI bleed.  GI following, underwent capsule endoscopy with results pending.  Will not resume anticoagulation.  Home when cleared by GI.  A & P  * Acute blood loss anemia --Status post EGD and colonoscopy June 14 showing gastric polyps, nonbleeding duodenal AVM treated with cautery.  Colonic polyps, diverticulosis.  Recent CT negative for obstruction or acute changes. --Eliquis appropriately discontinued --Hemoglobin up status post 1 unit PRBC, and trending upwards. --Continue PPI --Capsule endoscopy 8/20.  Await report.  Type 2 diabetes mellitus without complication, with long-term current use of insulin (HCC) --SSI, resume long-acting when blood sugars trend up, can resume metformin on discharge  Rheumatoid arthritis ([place]) --w/ multiple severe joint deformities --continue low-dose prednisone  Aortic atherosclerosis ([place]) --continue atorvastatin  History of pulmonary embolism --CTA chest August 2 showed resolution of thrombus --No previous history of DVT.  -- Discussed with pulmonologist Dr. Silas Flood, we will permanently discontinue apixaban.  Pancreatic cyst --Incidental finding, follow-up in 2 years with abdominal MRI with and without contrast  Disposition Plan:  Discussion: Improved, hemoglobin stable, no apparent bleeding.  Await results of small bowel capsule endoscopy, once cleared by gastroenterology, discharge home.  Status is: Inpatient  Remains inpatient appropriate because:IV treatments appropriate due to intensity of illness or inability to take PO and Inpatient level of care appropriate  due to severity of illness  Dispo: The patient is from: Home              Anticipated d/c is to: Home              Patient currently is not medically stable to d/c.   Difficult to place patient No  DVT prophylaxis: SCDs Start: 10/27/20 1926   Code Status: Full Code Level of care: Progressive Family Communication: none  Murray Hodgkins, MD  [facility] Direct contact: see [email or web address] (further directions at bottom of note if needed) 7PM-7AM contact night coverage as at bottom of note 10/29/2020, 2:20 PM  LOS: 2 days   Consults:  GI   Procedures:  Capsule endoscopy  Interval History/Subjective  CC: f/u anemia  Feels fine, no complaints  Objective   Vitals:  Vitals:   10/29/20 0510 10/29/20 1315  BP: 136/81 (!) 121/58  Pulse: 86 92  Resp: 16 20  Temp: 97.9 F (36.6 C) (!) 97.5 F (36.4 C)  SpO2: 95% 95%    Exam: Physical Exam Vitals reviewed.  Constitutional:      General: She is not in acute distress. Cardiovascular:     Rate and Rhythm: Normal rate and regular rhythm.     Heart sounds: No murmur heard. Pulmonary:     Effort: Pulmonary effort is normal. No respiratory distress.     Breath sounds: Normal breath sounds. No wheezing.  Neurological:     Mental Status: She is alert.  Psychiatric:        Mood and Affect: Mood normal.        Behavior: Behavior normal.    I have personally reviewed the labs and other data, making special note of:   Today's Data  Hgb up to 8.5   Scheduled Meds:  atorvastatin  20 mg  Oral Daily   budesonide  0.25 mg Nebulization BID   DULoxetine  60 mg Oral QHS   ezetimibe  10 mg Oral QHS   feeding supplement  237 mL Oral BID BM   ferrous gluconate  324 mg Oral QODAY   fesoterodine  4 mg Oral Daily   multivitamin with minerals  1 tablet Oral Daily   olopatadine  1 drop Both Eyes BID   [START ON 10/31/2020] pantoprazole  40 mg Intravenous Q12H   predniSONE  5 mg Oral QHS   vitamin B-12  2,500 mcg Oral QHS    Continuous Infusions:  sodium chloride 50 mL/hr at 10/29/20 0342   pantoprazole 8 mg/hr (10/29/20 0342)    Principal Problem:   Acute blood loss anemia Active Problems:   Rheumatoid arthritis (HCC)   Type 2 diabetes mellitus without complication, with long-term current use of insulin (HCC)   Asthma   Acute lower GI bleeding   AKI (acute kidney injury) ([place])   Pancreatic cyst   History of pulmonary embolism   Aortic atherosclerosis ([place])   LOS: 2 days   How to contact the [facility] Attending or Consulting provider 7A - 7P or covering provider during after hours [place], for this patient?  Check the care team in [facility] and look for a) attending/consulting [facility] provider listed and b) the [facility] team listed Log into [email or web address] and use [place]'s universal password to access. If you do not have the password, please contact the hospital operator. Locate the [facility] provider you are looking for under [facility] and page to a number that you can be directly reached. If you still have difficulty reaching the provider, please page the [facility] (Director on Call) for the Hospitalists listed on amion for assistance.

## 2020-10-29 NOTE — Progress Notes (Signed)
Initial Nutrition Assessment  DOCUMENTATION CODES:   Not applicable  INTERVENTION:   -Ensure Enlive po BID, each supplement provides 350 kcal and 20 grams of protein  -MVI with minerals daily  NUTRITION DIAGNOSIS:   Increased nutrient needs related to acute illness as evidenced by estimated needs.  GOAL:   Patient will meet greater than or equal to 90% of their needs  MONITOR:   PO intake, Supplement acceptance, Labs, Weight trends, Skin, I & O's  REASON FOR ASSESSMENT:   Malnutrition Screening Tool    ASSESSMENT:   [name] is a 80 y.o. female with medical history significant for PE diagnosed in May 2022 currently on Eliquis, asthma, type 2 diabetes, history of GI bleed, rheumatoid arthritis on chronic low-dose prednisone therapy who presented from PCP with concerns of low hemoglobin.  Pt admitted with acute blood loss anemia, upper GIB.   8/20- s/p capsule endoscopy  Reviewed I/O's: -898 ml x 24 hours and -1.5 L since admission  UOP: 1.7 L x 24 hours  Pt unavailable at time of visit. Attempted to speak with pt via call to hospital room phone, however, unable to reach. RD unable to obtain further nutrition-related history or complete nutrition-focused physical exam at this time.    Pt just advanced to a regular diet this morning. No meal completion data currently available to assess at this time.    Reviewed wt hx; pt has experienced a 14.5% wt loss over the past 3 months, which is significant for time frame.   Per MD notes, possible discharge later today pending capsule endoscopy results.   Medications reviewed and include prednisone, vitamin B-12, and 0.9% sodium chloride infusion @ 50 ml/hr.   Labs reviewed.   Diet Order:   Diet Order             Diet regular Room service appropriate? Yes; Fluid consistency: Thin  Diet effective now                   EDUCATION NEEDS:   No education needs have been identified at this time  Skin:  Skin  Assessment: Reviewed RN Assessment  Last BM:  Unknown  Height:   Ht Readings from Last 1 Encounters:  10/28/20 '5\' 5"'$  (1.651 m)    Weight:   Wt Readings from Last 1 Encounters:  10/28/20 67.1 kg    Ideal Body Weight:  56.8 kg  BMI:  Body mass index is 24.62 kg/m.  Estimated Nutritional Needs:   Kcal:  1800-2000  Protein:  90-105 grams  Fluid:  > 1.8 L    Loistine Chance, RD, LDN, [place] Registered Dietitian II Certified Diabetes Care and Education Specialist Please refer to [facility] for RD and/or RD on-call/weekend/after hours pager

## 2020-10-29 NOTE — Progress Notes (Addendum)
[facility] Gastroenterology Progress Note  [name] 80 y.o. 21-Oct-1940  CC: Symptomatic anemia   Subjective: Patient seen and examined at bedside.  Denies acute GI issues.  Denies any bleeding episodes.  Denies abdominal pain.  ROS : Afebrile.  Negative for chest pain   Objective: Vital signs in last 24 hours: Vitals:   10/28/20 2007 10/29/20 0510  BP: 133/76 136/81  Pulse: 91 86  Resp: 20 16  Temp: 97.9 F (36.6 C) 97.9 F (36.6 C)  SpO2: 95% 95%    Physical Exam: General : Elderly appearing patient, not in acute distress Abdomen : Soft, nontender, nondistended, bowel sounds present.  No peritoneal signs Neuro : Alert and oriented x3 Psych : Mood and affect normal   Lab Results: Recent Labs    10/27/20 1326 10/28/20 0258  NA 137 137  K 4.6 4.2  CL 107 108  CO2 22 22  GLUCOSE 133* 174*  BUN 28* 20  CREATININE 1.21* 0.91  CALCIUM 9.2 8.4*   Recent Labs    10/27/20 1326  AST 22  ALT 11  ALKPHOS 77  BILITOT 0.5  PROT 6.8  ALBUMIN 3.0*   Recent Labs    10/27/20 1326 10/28/20 0258 10/28/20 1136 10/29/20 0807  WBC 10.8*   < > 8.0 7.3  NEUTROABS 8.8*  --   --   --   HGB 7.2*   < > 7.7* 8.5*  HCT 25.9*   < > 26.2* 29.0*  MCV 78.0*   < > 77.7* 79.5*  PLT 556*   < > 426* 488*   < > = values in this interval not displayed.   No results for input(s): LABPROT, INR in the last 72 hours.    Assessment/Plan: Symptomatic anemia with hemoglobin of 7.2 on admission.  Occult blood negative -History of pulmonary embolism diagnosed in May 2022.  Was on Eliquis.  Last dose  08/19  -Rheumatoid arthritis on chronic low-dose prednisone - 11 mm simple appearing cyst in the pancreatic neck .  Need outpatient MRI MRCP for follow-up in 2 years   Recommendations ----------------------- -Follow capsule endoscopy report.  Hopefully capsule study will be downloaded today and will be ready to read time later today -Patient would like to eat regular diet.  Order  placed -Recent imaging studies were negative for pulmonary embolism.  Patient does not want to restart Eliquis/anticoagulation because of recurrent bleeding.  Long term Anticoagulation management per primary team. -Monitor H&H. -Hopefully discharge later today or tomorrow depending on capsule endoscopy report   Otis Brace MD, [place] 10/29/2020, 9:58 AM  Contact #  [phone]

## 2020-10-29 NOTE — Brief Op Note (Signed)
10/27/2020 - 10/28/2020  3:46 PM  PATIENT:  [name]  80 y.o. female  PRE-OPERATIVE DIAGNOSIS:  anemia  POST-OPERATIVE DIAGNOSIS:  pt ingested pill camera  PROCEDURE:  Procedure(s): GIVENS CAPSULE STUDY (N/A)  SURGEON:  Surgeon(s) and Role:    * Tabbitha Janvrin, MD - Primary  Findings --------- -Few small erosions in the small bowel and 1 small AVM in the proximal duodenum with stigmata of recent bleeding but no active bleeding.  Recommendations -----------------------  -Recommend repeat EGD with APC if drop in hemoglobin or if there is a clinical need for long-term anticoagulation use -Recommend overnight observation -Keep n.p.o. past midnight -Repeat CBC in the morning - D/W patient. -GI will follow  Otis Brace MD, [place] 10/29/2020, 3:50 PM  Contact #  [phone]

## 2020-10-29 NOTE — Plan of Care (Signed)
  Problem: Clinical Measurements: Goal: Complications related to the disease process, condition or treatment will be avoided or minimized Outcome: Progressing   Problem: Education: Goal: Knowledge of General Education information will improve Description: Including pain rating scale, medication(s)/side effects and non-pharmacologic comfort measures Outcome: Progressing   Problem: Nutrition: Goal: Adequate nutrition will be maintained Outcome: Progressing   Problem: Pain Managment: Goal: General experience of comfort will improve Outcome: Progressing

## 2020-10-30 ENCOUNTER — Encounter (HOSPITAL_COMMUNITY): Payer: Self-pay | Admitting: Gastroenterology

## 2020-10-30 LAB — TYPE AND SCREEN
ABO/RH(D): B NEG
Antibody Screen: POSITIVE
Unit division: 0

## 2020-10-30 LAB — BPAM RBC
Blood Product Expiration Date: 202209012359
ISSUE DATE / TIME: 202208191812
Unit Type and Rh: 1700

## 2020-10-30 LAB — CBC
HCT: 29.3 % — ABNORMAL LOW (ref 36.0–46.0)
Hemoglobin: 8.5 g/dL — ABNORMAL LOW (ref 12.0–15.0)
MCH: 23 pg — ABNORMAL LOW (ref 26.0–34.0)
MCHC: 29 g/dL — ABNORMAL LOW (ref 30.0–36.0)
MCV: 79.4 fL — ABNORMAL LOW (ref 80.0–100.0)
Platelets: 410 10*3/uL — ABNORMAL HIGH (ref 150–400)
RBC: 3.69 MIL/uL — ABNORMAL LOW (ref 3.87–5.11)
RDW: 18.7 % — ABNORMAL HIGH (ref 11.5–15.5)
WBC: 8.2 10*3/uL (ref 4.0–10.5)
nRBC: 0 % (ref 0.0–0.2)

## 2020-10-30 LAB — GLUCOSE, CAPILLARY: Glucose-Capillary: 90 mg/dL (ref 70–99)

## 2020-10-30 MED ORDER — OLOPATADINE HCL 0.1 % OP SOLN: 1.0000 [drp] | Freq: Two times a day (BID) | OPHTHALMIC | 0 refills | Status: DC

## 2020-10-30 NOTE — Consult Note (Signed)
[facility] Inpatient Consult   10/30/2020  ALIEGHA HOCKENBERRY [birth year] [id number]  Patient is currently pending for with [facility] for chronic disease management services. Referral received 10/09/2020 from post acute care manager for services as patient was going home from ALF. Three unsuccessful attempts have been made within past month to speak to patient by telephone. Chart review reveals high risk score for unplanned readmission noted.   Plan: Will continue to follow for progression and disposition.  Of note, [facility] Care Management services does not replace or interfere with any services that are arranged by inpatient case management or social work.   Netta Cedars, MSN, [facility] Liaison Nurse Mobile Phone [phone]  Toll free office [phone]

## 2020-10-30 NOTE — Plan of Care (Signed)
  Problem: Education: Goal: Ability to identify signs and symptoms of gastrointestinal bleeding will improve 10/30/2020 1600 by Lennie Hummer, RN Outcome: Adequate for Discharge 10/30/2020 1502 by Lennie Hummer, RN Outcome: Adequate for Discharge   Problem: Bowel/Gastric: Goal: Will show no signs and symptoms of gastrointestinal bleeding 10/30/2020 1600 by Lennie Hummer, RN Outcome: Adequate for Discharge 10/30/2020 1502 by Lennie Hummer, RN Outcome: Adequate for Discharge   Problem: Fluid Volume: Goal: Will show no signs and symptoms of excessive bleeding Outcome: Adequate for Discharge   Problem: Clinical Measurements: Goal: Complications related to the disease process, condition or treatment will be avoided or minimized Outcome: Adequate for Discharge   Problem: Education: Goal: Knowledge of General Education information will improve Description: Including pain rating scale, medication(s)/side effects and non-pharmacologic comfort measures 10/30/2020 1600 by Lennie Hummer, RN Outcome: Adequate for Discharge 10/30/2020 1502 by Lennie Hummer, RN Outcome: Adequate for Discharge   Problem: Clinical Measurements: Goal: Ability to maintain clinical measurements within normal limits will improve Outcome: Adequate for Discharge   Problem: Activity: Goal: Risk for activity intolerance will decrease Outcome: Adequate for Discharge   Problem: Nutrition: Goal: Adequate nutrition will be maintained Outcome: Adequate for Discharge   Problem: Elimination: Goal: Will not experience complications related to bowel motility Outcome: Adequate for Discharge Goal: Will not experience complications related to urinary retention Outcome: Adequate for Discharge   Problem: Pain Managment: Goal: General experience of comfort will improve Outcome: Adequate for Discharge   Problem: Safety: Goal: Ability to remain free from injury will improve Outcome: Adequate for Discharge

## 2020-10-30 NOTE — Plan of Care (Signed)
  Problem: Education: Goal: Ability to identify signs and symptoms of gastrointestinal bleeding will improve 10/30/2020 1600 by Lennie Hummer, RN Outcome: Adequate for Discharge 10/30/2020 1600 by Lennie Hummer, RN Outcome: Adequate for Discharge 10/30/2020 1502 by Lennie Hummer, RN Outcome: Adequate for Discharge   Problem: Bowel/Gastric: Goal: Will show no signs and symptoms of gastrointestinal bleeding 10/30/2020 1600 by Lennie Hummer, RN Outcome: Adequate for Discharge 10/30/2020 1600 by Lennie Hummer, RN Outcome: Adequate for Discharge 10/30/2020 1502 by Lennie Hummer, RN Outcome: Adequate for Discharge   Problem: Fluid Volume: Goal: Will show no signs and symptoms of excessive bleeding 10/30/2020 1600 by Lennie Hummer, RN Outcome: Adequate for Discharge 10/30/2020 1600 by Lennie Hummer, RN Outcome: Adequate for Discharge   Problem: Clinical Measurements: Goal: Complications related to the disease process, condition or treatment will be avoided or minimized 10/30/2020 1600 by Lennie Hummer, RN Outcome: Adequate for Discharge 10/30/2020 1600 by Lennie Hummer, RN Outcome: Adequate for Discharge   Problem: Education: Goal: Knowledge of General Education information will improve Description: Including pain rating scale, medication(s)/side effects and non-pharmacologic comfort measures 10/30/2020 1600 by Lennie Hummer, RN Outcome: Adequate for Discharge 10/30/2020 1600 by Lennie Hummer, RN Outcome: Adequate for Discharge 10/30/2020 1502 by Lennie Hummer, RN Outcome: Adequate for Discharge   Problem: Clinical Measurements: Goal: Ability to maintain clinical measurements within normal limits will improve 10/30/2020 1600 by Lennie Hummer, RN Outcome: Adequate for Discharge 10/30/2020 1600 by Lennie Hummer, RN Outcome: Adequate for Discharge   Problem: Activity: Goal: Risk for activity intolerance will decrease 10/30/2020 1600 by Lennie Hummer,  RN Outcome: Adequate for Discharge 10/30/2020 1600 by Lennie Hummer, RN Outcome: Adequate for Discharge   Problem: Nutrition: Goal: Adequate nutrition will be maintained 10/30/2020 1600 by Lennie Hummer, RN Outcome: Adequate for Discharge 10/30/2020 1600 by Lennie Hummer, RN Outcome: Adequate for Discharge   Problem: Elimination: Goal: Will not experience complications related to bowel motility 10/30/2020 1600 by Lennie Hummer, RN Outcome: Adequate for Discharge 10/30/2020 1600 by Lennie Hummer, RN Outcome: Adequate for Discharge Goal: Will not experience complications related to urinary retention 10/30/2020 1600 by Lennie Hummer, RN Outcome: Adequate for Discharge 10/30/2020 1600 by Lennie Hummer, RN Outcome: Adequate for Discharge   Problem: Pain Managment: Goal: General experience of comfort will improve 10/30/2020 1600 by Lennie Hummer, RN Outcome: Adequate for Discharge 10/30/2020 1600 by Lennie Hummer, RN Outcome: Adequate for Discharge   Problem: Safety: Goal: Ability to remain free from injury will improve 10/30/2020 1600 by Lennie Hummer, RN Outcome: Adequate for Discharge 10/30/2020 1600 by Lennie Hummer, RN Outcome: Adequate for Discharge

## 2020-10-30 NOTE — Plan of Care (Signed)
  Problem: Education: Goal: Ability to identify signs and symptoms of gastrointestinal bleeding will improve Outcome: Adequate for Discharge   Problem: Bowel/Gastric: Goal: Will show no signs and symptoms of gastrointestinal bleeding Outcome: Adequate for Discharge   Problem: Education: Goal: Knowledge of General Education information will improve Description: Including pain rating scale, medication(s)/side effects and non-pharmacologic comfort measures Outcome: Adequate for Discharge

## 2020-10-30 NOTE — Discharge Summary (Signed)
Physician Discharge Summary  [name] [id number] DOB: 09/01/1940 DOA: 10/27/2020  PCP: [name], MD  Admit date: 10/27/2020 Discharge date: 10/30/2020  Recommendations for Outpatient Follow-up:  * Acute blood loss anemia --Status post EGD and colonoscopy June 14 showing gastric polyps, nonbleeding duodenal AVM treated with cautery.  Colonic polyps, diverticulosis.  Recent CT negative for obstruction or acute changes. 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  -- Discussed with pulmonologist Dr. Silas Flood, we will permanently discontinue apixaban.  Pancreatic cyst --Incidental finding, follow-up in 2 years with abdominal MRI with and without contrast   Follow-up Information     Buccini, Robert, MD. Schedule an appointment as soon as possible for a visit in 6 week(s).   Specialty: Gastroenterology Contact information: [id number] [place] [address] [phone]                  Discharge Diagnoses: Principal diagnosis is #1 Principal Problem:   Acute blood loss anemia Active Problems:   Rheumatoid arthritis ([place])   Type 2 diabetes mellitus without complication, with long-term current use of insulin (HCC)   Asthma   Acute lower GI bleeding   AKI (acute kidney injury) ([place])   Pancreatic cyst   History of pulmonary embolism   Aortic atherosclerosis ([place])   Discharge Condition: improved Disposition: home  Diet recommendation:  Diet Orders (From admission, onward)     Start      Ordered   10/30/20 1255  DIET SOFT Room service appropriate? Yes; Fluid consistency: Thin  Diet effective now       Question Answer Comment  Room service appropriate? Yes   Fluid consistency: Thin      10/30/20 1254   10/30/20 0000  Diet - low sodium heart healthy        10/30/20 1327   10/30/20 0000  Diet Carb Modified        10/30/20 1327             Filed Weights   10/28/20 0100  Weight: 67.1 kg    HPI/Hospital Course:   80 year old woman PMH PE May 2021 treated with apixaban, GI bleed in June 2020 on apixaban, rheumatoid arthritis on low-dose prednisone presented with melena, generalized weakness, several weeks nausea and vomiting.  Admitted for acute blood loss anemia, suspected upper GI bleed.  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  Discussed with gastroenterology, no further bleeding, hemoglobin stable, cleared for discharge, follow-up in the outpatient setting 2-3 weeks.   * Acute blood loss anemia --Status post EGD and colonoscopy June 14 showing gastric polyps, nonbleeding duodenal AVM treated with cautery.  Colonic polyps, diverticulosis.  Recent CT negative for obstruction or acute changes. 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  -- Discussed with pulmonologist Dr. Silas Flood, we will permanently discontinue apixaban.  Pancreatic cyst --Incidental finding, follow-up in 2 years with abdominal MRI with and without contrast     Today's assessment: S: CC: f/u bleeding  Feels good no complaints other than itching eyes from mascara No bleeding Ready to go home  O: Vitals:  Vitals:   10/29/20 2040 10/30/20 0425  BP: 116/60 139/82  Pulse: 97 86  Resp: 16 17  Temp: 98.2 F (36.8 C) 98.6 F (37 C)  SpO2: 93% 96%    Constitutional:  Appears calm and comfortable Eyes:  pupils and irises appear normal Normal lids and conjunctivae Respiratory:  CTA bilaterally, no w/r/r.  Respiratory effort normal.  Cardiovascular:  RRR, no m/r/g Abdomen:  soft Psychiatric:  Mental status Mood, affect appropriate  CBG stable Hgb stable 8.5  Discharge Instructions  Discharge Instructions     Diet - low sodium heart healthy   Complete by: As directed    Diet Carb Modified   Complete by: As directed    Discharge instructions   Complete by: As directed    Call your physician or seek immediate medical attention for bleeding, weakness, pain, fever or worsening of condition.   Increase activity slowly   Complete by: As directed       Allergies as of 10/30/2020       Reactions   Penicillins Hives   Has patient had a PCN reaction causing immediate rash, facial/tongue/throat swelling, SOB or lightheadedness with hypotension: Yes Has patient had a PCN reaction causing severe rash involving mucus membranes or skin necrosis: No Has patient had a PCN reaction that required hospitalization: No Has patient had a PCN reaction occurring within the last 10 years: No If all of the above answers are "NO", then may proceed with Cephalosporin use. Patient has tolerated ceftriaxone in the recent  past.   Sulfamethoxazole-trimethoprim Nausea And Vomiting, Other (See Comments)   Stomach problems   Adalimumab Other (See Comments)   "Not effective"   Hydroxychloroquine Sulfate Other (See Comments)   "Plaquenil did not work after [employer]"   Meperidine Other (See Comments)   Cramping   Meperidine Hcl Other (See Comments)   Cramping   Methotrexate Nausea And Vomiting, Other (See Comments)   "Projectile vomiting"   Penicillin G Sodium Hives   Phentermine-topiramate Nausea Only, Other (See Comments)   "Nausea and dizziness & on re-trial, mental side effects so really cannot take it   Sulfa Antibiotics Nausea And Vomiting, Other (See Comments)   Stomach problems/GI upset, also   Sulfonamide Derivatives Nausea And Vomiting, Other (See Comments)   "Stomach problems"        Medication List     STOP taking these medications    apixaban 5 MG Tabs tablet Commonly known as: ELIQUIS       TAKE these medications    acetaminophen 500 MG tablet Commonly known as: TYLENOL Take 1,000 mg by mouth every 6 (six) hours as needed for mild pain (or headaches).   albuterol 108 (  90 Base) MCG/ACT inhaler Commonly known as: VENTOLIN HFA Inhale 2 puffs into the lungs every 6 (six) hours as needed for shortness of breath or wheezing.   arformoterol 15 MCG/2ML Nebu Commonly known as: BROVANA Take 15 mcg by nebulization 2 (two) times daily as needed (for shortness of breath).   atorvastatin 20 MG tablet Commonly known as: LIPITOR Take 20 mg by mouth daily.   B-12 2500 MCG Tabs Take 2,500 mcg by mouth at bedtime.   B-D UF III MINI PEN NEEDLES 31G X 5 MM Misc Generic drug: Insulin Pen Needle SMARTSIG:1 Each SUB-Q 4 Times Daily   budesonide 0.25 MG/2ML nebulizer solution Commonly known as: PULMICORT Take 2 mLs (0.25 mg total) by nebulization 2 (two) times daily.   DULoxetine 60 MG capsule Commonly known as: CYMBALTA Take 60 mg by mouth at bedtime.   ezetimibe 10 MG tablet Commonly  known as: ZETIA Take 10 mg by mouth at bedtime.   ferrous gluconate 324 MG tablet Commonly known as: FERGON Take 324 mg by mouth every other day.   ipratropium-albuterol 0.5-2.5 (3) MG/3ML Soln Commonly known as: DUONEB Take 3 mLs by nebulization every 6 (six) hours as needed (wheezing).   metFORMIN 500 MG tablet Commonly known as: GLUCOPHAGE Take 500 mg by mouth 2 (two) times daily.   NovoLIN 70/30 Kwikpen (70-30) 100 UNIT/ML KwikPen Generic drug: insulin isophane & regular human KwikPen Inject 14 Units into the skin 2 (two) times daily.   olopatadine 0.1 % ophthalmic solution Commonly known as: PATANOL Place 1 drop into both eyes 2 (two) times daily.   ondansetron 8 MG disintegrating tablet Commonly known as: ZOFRAN-ODT Take 8 mg by mouth every 8 (eight) hours as needed for nausea or vomiting (dissolve orally).   pantoprazole 40 MG tablet Commonly known as: Protonix Take one tablet once every morning What changed:  how much to take how to take this when to take this additional instructions   predniSONE 5 MG tablet Commonly known as: DELTASONE Take 5 mg by mouth at bedtime.   tolterodine 2 MG 24 hr capsule Commonly known as: DETROL LA Take 2 mg by mouth every evening.       Allergies  Allergen Reactions   Penicillins Hives    Has patient had a PCN reaction causing immediate rash, facial/tongue/throat swelling, SOB or lightheadedness with hypotension: Yes Has patient had a PCN reaction causing severe rash involving mucus membranes or skin necrosis: No Has patient had a PCN reaction that required hospitalization: No Has patient had a PCN reaction occurring within the last 10 years: No If all of the above answers are "NO", then may proceed with Cephalosporin use. Patient has tolerated ceftriaxone in the recent past.   Sulfamethoxazole-Trimethoprim Nausea And Vomiting and Other (See Comments)    Stomach problems   Adalimumab Other (See Comments)    "Not  effective"   Hydroxychloroquine Sulfate Other (See Comments)    "Plaquenil did not work after [employer]"   Meperidine Other (See Comments)    Cramping   Meperidine Hcl Other (See Comments)    Cramping    Methotrexate Nausea And Vomiting and Other (See Comments)    "Projectile vomiting"   Penicillin G Sodium Hives   Phentermine-Topiramate Nausea Only and Other (See Comments)    "Nausea and dizziness & on re-trial, mental side effects so really cannot take it   Sulfa Antibiotics Nausea And Vomiting and Other (See Comments)    Stomach problems/GI upset, also   Sulfonamide Derivatives Nausea  And Vomiting and Other (See Comments)    "Stomach problems"    The results of significant diagnostics from this hospitalization (including imaging, microbiology, ancillary and laboratory) are listed below for reference.    Significant Diagnostic Studies: CT Abdomen Pelvis W Contrast  Result Date: 10/27/2020 CLINICAL DATA:  Generalized abdominal pain.  Vomiting.  Diarrhea. EXAM: CT ABDOMEN AND PELVIS WITH CONTRAST TECHNIQUE: Multidetector CT imaging of the abdomen and pelvis was performed using the standard protocol following bolus administration of intravenous contrast. CONTRAST:  66m OMNIPAQUE IOHEXOL 350 MG/ML SOLN COMPARISON:  10/10/2020 FINDINGS: Lower Chest: No acute findings. Mild bilateral lower lobe bronchiectasis and pleural-parenchymal scarring. Hepatobiliary: No hepatic masses identified. Gallbladder is unremarkable. No evidence of biliary ductal dilatation. Pancreas: 11 mm simple appearing cyst in the pancreatic neck remains stable. No evidence of pancreatic ductal dilatation or peripancreatic inflammatory changes. Spleen: Within normal limits in size. Splenic cysts remain stable, largest measuring 5 cm. Adrenals/Urinary Tract: A few tiny sub-cm renal cysts are again noted. No masses identified. No evidence of ureteral calculi or hydronephrosis. Unremarkable unopacified urinary bladder.  Stomach/Bowel: No evidence of obstruction, inflammatory process or abnormal fluid collections. Normal appendix visualized. Diverticulosis is seen mainly involving the sigmoid colon, however there is no evidence of diverticulitis. Vascular/Lymphatic: No pathologically enlarged lymph nodes. No acute vascular findings. Aortic atherosclerotic calcification noted. Reproductive: Prior hysterectomy noted. Adnexal regions are unremarkable in appearance. Other: Stable small bilateral inguinal hernias which contain only fat. Musculoskeletal: No suspicious bone lesions identified. Severe degenerative disc disease noted at L2-3, L3-4, and L4-5. IMPRESSION: No acute findings within the abdomen or pelvis. Colonic diverticulosis, without radiographic evidence of diverticulitis. Stable 11 mm simple appearing cyst in pancreatic neck. Recommend continued imaging follow-up in 2 years, preferably with abdomen MRI without and with contrast. This recommendation follows ACR consensus guidelines: Management of Incidental Pancreatic Cysts: A White Paper of the ACR Incidental Findings Committee. [id number] Aortic Atherosclerosis (ICD10-I70.0). Electronically Signed   By: JMarlaine HindM.D.   On: 10/27/2020 17:30   DG Chest Portable 1 View  Result Date: 10/27/2020 CLINICAL DATA:  Cough, weakness and generalized pain. Vomiting for 4 days. EXAM: PORTABLE CHEST 1 VIEW COMPARISON:  October 10, 2020. FINDINGS: EKG leads project over the chest. Cardiomediastinal contours are stable likely with mild cardiac enlargement. Central pulmonary vascular congestion is a stable finding without signs of edema, consolidation or evidence of pleural effusion. No visible pneumothorax. On limited assessment no acute skeletal process. IMPRESSION: Stable mild cardiac enlargement and central pulmonary vascular congestion without acute process. Electronically Signed   By: GZetta BillsM.D.   On: 10/27/2020 14:54    Microbiology: Recent  Results (from the past 240 hour(s))  Resp Panel by RT-PCR (Flu A&B, Covid) Nasopharyngeal Swab     Status: None   Collection Time: 10/27/20  2:56 PM   Specimen: Nasopharyngeal Swab; Nasopharyngeal(NP) swabs in vial transport medium  Result Value Ref Range Status   SARS Coronavirus 2 by RT PCR NEGATIVE NEGATIVE Final    Comment: (NOTE) SARS-CoV-2 target nucleic acids are NOT DETECTED.  The SARS-CoV-2 RNA is generally detectable in upper respiratory specimens during the acute phase of infection. The lowest concentration of SARS-CoV-2 viral copies this assay can detect is 138 copies/mL. A negative result does not preclude SARS-Cov-2 infection and should not be used as the sole basis for treatment or other patient management decisions. A negative result may occur with  improper specimen collection/handling, submission of specimen other than nasopharyngeal swab,  presence of viral mutation(s) within the areas targeted by this assay, and inadequate number of viral copies(<138 copies/mL). A negative result must be combined with clinical observations, patient history, and epidemiological information. The expected result is Negative.  Fact Sheet for Patients:  EntrepreneurPulse.com.au  Fact Sheet for Healthcare Providers:  IncredibleEmployment.be  This test is no t yet approved or cleared by the [place] FDA and  has been authorized for detection and/or diagnosis of SARS-CoV-2 by FDA under an Emergency Use Authorization (EUA). This EUA will remain  in effect (meaning this test can be used) for the duration of the COVID-19 declaration under Section 564(b)(1) of the Act, 21 U.S.C.section 360bbb-3(b)(1), unless the authorization is terminated  or revoked sooner.       Influenza A by PCR NEGATIVE NEGATIVE Final   Influenza B by PCR NEGATIVE NEGATIVE Final    Comment: (NOTE) The Xpert Xpress SARS-CoV-2/FLU/RSV plus assay is intended as an aid in the  diagnosis of influenza from Nasopharyngeal swab specimens and should not be used as a sole basis for treatment. Nasal washings and aspirates are unacceptable for Xpert Xpress SARS-CoV-2/FLU/RSV testing.  Fact Sheet for Patients: EntrepreneurPulse.com.au  Fact Sheet for Healthcare Providers: IncredibleEmployment.be  This test is not yet approved or cleared by the [place] FDA and has been authorized for detection and/or diagnosis of SARS-CoV-2 by FDA under an Emergency Use Authorization (EUA). This EUA will remain in effect (meaning this test can be used) for the duration of the COVID-19 declaration under Section 564(b)(1) of the Act, 21 U.S.C. section 360bbb-3(b)(1), unless the authorization is terminated or revoked.  Performed at [facility], [place] [address] [place], [place] [address]      Labs: Basic Metabolic Panel: Recent Labs  Lab 10/27/20 1326 10/28/20 0258  NA 137 137  K 4.6 4.2  CL 107 108  CO2 22 22  GLUCOSE 133* 174*  BUN 28* 20  CREATININE 1.21* 0.91  CALCIUM 9.2 8.4*   Liver Function Tests: Recent Labs  Lab 10/27/20 1326  AST 22  ALT 11  ALKPHOS 77  BILITOT 0.5  PROT 6.8  ALBUMIN 3.0*   Recent Labs  Lab 10/27/20 1326  LIPASE 28   CBC: Recent Labs  Lab 10/27/20 1326 10/28/20 0258 10/28/20 1136 10/29/20 0807 10/30/20 0417  WBC 10.8* 9.5 8.0 7.3 8.2  NEUTROABS 8.8*  --   --   --   --   HGB 7.2* 7.9* 7.7* 8.5* 8.5*  HCT 25.9* 27.2* 26.2* 29.0* 29.3*  MCV 78.0* 79.3* 77.7* 79.5* 79.4*  PLT 556* 462* 426* 488* 410*   Cardiac Enzymes: Recent Labs  Lab 10/27/20 1326  CKTOTAL 19*   CBG: Recent Labs  Lab 10/29/20 1657 10/29/20 2040 10/30/20 1128  GLUCAP 167* 140* 90    Principal Problem:   Acute blood loss anemia Active Problems:   Rheumatoid arthritis ([place])   Type 2 diabetes mellitus without complication, with long-term current use of insulin (HCC)   Asthma   Acute  lower GI bleeding   AKI (acute kidney injury) ([place])   Pancreatic cyst   History of pulmonary embolism   Aortic atherosclerosis ([place])   Time coordinating discharge: 25 minutes  Signed:  Murray Hodgkins, MD  [facility]  10/30/2020, 2:56 PM

## 2020-10-30 NOTE — Plan of Care (Signed)
  Problem: Education: Goal: Ability to identify signs and symptoms of gastrointestinal bleeding will improve Outcome: Progressing   Problem: Bowel/Gastric: Goal: Will show no signs and symptoms of gastrointestinal bleeding Outcome: Progressing   Problem: Fluid Volume: Goal: Will show no signs and symptoms of excessive bleeding Outcome: Progressing   Problem: Clinical Measurements: Goal: Complications related to the disease process, condition or treatment will be avoided or minimized Outcome: Progressing   Problem: Education: Goal: Knowledge of General Education information will improve Description: Including pain rating scale, medication(s)/side effects and non-pharmacologic comfort measures Outcome: Progressing   Problem: Clinical Measurements: Goal: Ability to maintain clinical measurements within normal limits will improve Outcome: Progressing   Problem: Activity: Goal: Risk for activity intolerance will decrease Outcome: Progressing   Problem: Nutrition: Goal: Adequate nutrition will be maintained Outcome: Progressing   Problem: Elimination: Goal: Will not experience complications related to bowel motility Outcome: Progressing Goal: Will not experience complications related to urinary retention Outcome: Progressing   Problem: Pain Managment: Goal: General experience of comfort will improve Outcome: Progressing   Problem: Safety: Goal: Ability to remain free from injury will improve Outcome: Progressing

## 2020-10-30 NOTE — TOC Transition Note (Signed)
Transition of Care [facility]) - CM/SW Discharge Note   Patient Details  Name: [name] MRN: [id number] Date of Birth: 04/27/1940  [facility] CM/SW Contact:  Dessa Phi, RN Phone Number: 10/30/2020, 3:18 PM   Clinical Narrative: Spoke to patient in rm about d/c plans-says she is going home,has a friend to pick her up-she says she will set up her [place] with [facility]-CM contacted [facility] to resume services rep Malachy Mood aware. Patient has own transport home.Informed patient of mail order through her pharmacy-voiced understanding. No further CM needs.      Final next level of care: Home/Self Care Barriers to Discharge: No Barriers Identified   Patient Goals and CMS Choice   CMS Medicare.gov Compare Post Acute Care list provided to:: Patient    Discharge Placement                       Discharge Plan and Services   Discharge Planning Services: CM Consult                                 Social Determinants of Health (SDOH) Interventions     Readmission Risk Interventions No flowsheet data found.

## 2020-10-31 ENCOUNTER — Ambulatory Visit (HOSPITAL_COMMUNITY): Payer: Medicare Other

## 2020-11-01 LAB — GLUCOSE, CAPILLARY: Glucose-Capillary: 136 mg/dL — ABNORMAL HIGH (ref 70–99)

## 2020-11-02 ENCOUNTER — Other Ambulatory Visit: Payer: Self-pay | Admitting: *Deleted

## 2020-11-02 DIAGNOSIS — E1122 Type 2 diabetes mellitus with diabetic chronic kidney disease: Secondary | ICD-10-CM | POA: Diagnosis not present

## 2020-11-02 DIAGNOSIS — D62 Acute posthemorrhagic anemia: Secondary | ICD-10-CM | POA: Diagnosis not present

## 2020-11-02 DIAGNOSIS — G894 Chronic pain syndrome: Secondary | ICD-10-CM | POA: Diagnosis not present

## 2020-11-02 DIAGNOSIS — J84115 Respiratory bronchiolitis interstitial lung disease: Secondary | ICD-10-CM | POA: Diagnosis not present

## 2020-11-02 DIAGNOSIS — M06072 Rheumatoid arthritis without rheumatoid factor, left ankle and foot: Secondary | ICD-10-CM | POA: Diagnosis not present

## 2020-11-02 DIAGNOSIS — J439 Emphysema, unspecified: Secondary | ICD-10-CM | POA: Diagnosis not present

## 2020-11-02 DIAGNOSIS — M06071 Rheumatoid arthritis without rheumatoid factor, right ankle and foot: Secondary | ICD-10-CM | POA: Diagnosis not present

## 2020-11-02 NOTE — Patient Outreach (Signed)
[facility]) Care Management  11/02/2020  [name] 1941/01/10 [id number]  Telephone Assessment-Successful concerning lab draw Transition of care completed by primary provider Discharged from the hospital 8/22  Pt called RN Case Manager [facility]) and requested this RN Case Manager to call this pt. Pt requesting resource for blood draw for Hgb stating this was the issues for her last admission. Pt encouraged to call her primary provider which she has done with a pending appointment on 8/29 or involved [facility] with the request.  Pt is aware [facility] does not provider labs in the home. Pt with understanding and will follow up with her provider.   RN inquired further to completed an updated assessment today however pt requested another call back on 9/1 next week after her follow up appointment.  Will follow up next week with update.  [name], RN Care Management Coordinator [place] [facility] [phone]

## 2020-11-06 DIAGNOSIS — K922 Gastrointestinal hemorrhage, unspecified: Secondary | ICD-10-CM | POA: Diagnosis not present

## 2020-11-06 DIAGNOSIS — M47812 Spondylosis without myelopathy or radiculopathy, cervical region: Secondary | ICD-10-CM | POA: Diagnosis not present

## 2020-11-06 DIAGNOSIS — N1831 Chronic kidney disease, stage 3a: Secondary | ICD-10-CM | POA: Diagnosis not present

## 2020-11-06 DIAGNOSIS — E1151 Type 2 diabetes mellitus with diabetic peripheral angiopathy without gangrene: Secondary | ICD-10-CM | POA: Diagnosis not present

## 2020-11-06 DIAGNOSIS — D649 Anemia, unspecified: Secondary | ICD-10-CM | POA: Diagnosis not present

## 2020-11-06 DIAGNOSIS — D801 Nonfamilial hypogammaglobulinemia: Secondary | ICD-10-CM | POA: Diagnosis not present

## 2020-11-06 DIAGNOSIS — M069 Rheumatoid arthritis, unspecified: Secondary | ICD-10-CM | POA: Diagnosis not present

## 2020-11-06 DIAGNOSIS — J849 Interstitial pulmonary disease, unspecified: Secondary | ICD-10-CM | POA: Diagnosis not present

## 2020-11-06 DIAGNOSIS — D8989 Other specified disorders involving the immune mechanism, not elsewhere classified: Secondary | ICD-10-CM | POA: Diagnosis not present

## 2020-11-06 DIAGNOSIS — M810 Age-related osteoporosis without current pathological fracture: Secondary | ICD-10-CM | POA: Diagnosis not present

## 2020-11-06 DIAGNOSIS — F329 Major depressive disorder, single episode, unspecified: Secondary | ICD-10-CM | POA: Diagnosis not present

## 2020-11-06 DIAGNOSIS — M5136 Other intervertebral disc degeneration, lumbar region: Secondary | ICD-10-CM | POA: Diagnosis not present

## 2020-11-07 ENCOUNTER — Other Ambulatory Visit: Payer: Self-pay

## 2020-11-07 ENCOUNTER — Inpatient Hospital Stay (HOSPITAL_COMMUNITY)
Admission: EM | Admit: 2020-11-07 | Discharge: 2020-11-16 | DRG: 378 | Disposition: A | Discharge status: Skilled Nursing Facility | Payer: Medicare Other | Attending: Internal Medicine | Admitting: Internal Medicine

## 2020-11-07 ENCOUNTER — Emergency Department (HOSPITAL_COMMUNITY): Payer: Medicare Other

## 2020-11-07 DIAGNOSIS — E1122 Type 2 diabetes mellitus with diabetic chronic kidney disease: Secondary | ICD-10-CM | POA: Diagnosis not present

## 2020-11-07 DIAGNOSIS — D509 Iron deficiency anemia, unspecified: Secondary | ICD-10-CM

## 2020-11-07 DIAGNOSIS — K31811 Angiodysplasia of stomach and duodenum with bleeding: Principal | ICD-10-CM | POA: Diagnosis present

## 2020-11-07 DIAGNOSIS — D638 Anemia in other chronic diseases classified elsewhere: Secondary | ICD-10-CM | POA: Diagnosis present

## 2020-11-07 DIAGNOSIS — M25562 Pain in left knee: Secondary | ICD-10-CM | POA: Diagnosis not present

## 2020-11-07 DIAGNOSIS — I959 Hypotension, unspecified: Secondary | ICD-10-CM | POA: Diagnosis present

## 2020-11-07 DIAGNOSIS — Z833 Family history of diabetes mellitus: Secondary | ICD-10-CM

## 2020-11-07 DIAGNOSIS — E785 Hyperlipidemia, unspecified: Secondary | ICD-10-CM | POA: Diagnosis present

## 2020-11-07 DIAGNOSIS — W19XXXA Unspecified fall, initial encounter: Secondary | ICD-10-CM | POA: Diagnosis not present

## 2020-11-07 DIAGNOSIS — D649 Anemia, unspecified: Secondary | ICD-10-CM | POA: Diagnosis not present

## 2020-11-07 DIAGNOSIS — M25561 Pain in right knee: Secondary | ICD-10-CM | POA: Diagnosis not present

## 2020-11-07 DIAGNOSIS — R58 Hemorrhage, not elsewhere classified: Secondary | ICD-10-CM | POA: Diagnosis not present

## 2020-11-07 DIAGNOSIS — K625 Hemorrhage of anus and rectum: Secondary | ICD-10-CM

## 2020-11-07 DIAGNOSIS — T1490XA Injury, unspecified, initial encounter: Secondary | ICD-10-CM | POA: Diagnosis not present

## 2020-11-07 DIAGNOSIS — M25552 Pain in left hip: Secondary | ICD-10-CM | POA: Diagnosis not present

## 2020-11-07 DIAGNOSIS — Z79899 Other long term (current) drug therapy: Secondary | ICD-10-CM

## 2020-11-07 DIAGNOSIS — R000 Tachycardia, unspecified: Secondary | ICD-10-CM | POA: Diagnosis not present

## 2020-11-07 DIAGNOSIS — M5134 Other intervertebral disc degeneration, thoracic region: Secondary | ICD-10-CM | POA: Diagnosis not present

## 2020-11-07 DIAGNOSIS — G9340 Encephalopathy, unspecified: Secondary | ICD-10-CM | POA: Diagnosis present

## 2020-11-07 DIAGNOSIS — R509 Fever, unspecified: Secondary | ICD-10-CM | POA: Diagnosis not present

## 2020-11-07 DIAGNOSIS — Z96653 Presence of artificial knee joint, bilateral: Secondary | ICD-10-CM | POA: Diagnosis present

## 2020-11-07 DIAGNOSIS — I10 Essential (primary) hypertension: Secondary | ICD-10-CM | POA: Diagnosis not present

## 2020-11-07 DIAGNOSIS — H9190 Unspecified hearing loss, unspecified ear: Secondary | ICD-10-CM | POA: Diagnosis present

## 2020-11-07 DIAGNOSIS — F419 Anxiety disorder, unspecified: Secondary | ICD-10-CM | POA: Diagnosis present

## 2020-11-07 DIAGNOSIS — Z7984 Long term (current) use of oral hypoglycemic drugs: Secondary | ICD-10-CM

## 2020-11-07 DIAGNOSIS — Z043 Encounter for examination and observation following other accident: Secondary | ICD-10-CM | POA: Diagnosis not present

## 2020-11-07 DIAGNOSIS — Z96642 Presence of left artificial hip joint: Secondary | ICD-10-CM | POA: Diagnosis not present

## 2020-11-07 DIAGNOSIS — M069 Rheumatoid arthritis, unspecified: Secondary | ICD-10-CM | POA: Diagnosis present

## 2020-11-07 DIAGNOSIS — M797 Fibromyalgia: Secondary | ICD-10-CM | POA: Diagnosis present

## 2020-11-07 DIAGNOSIS — R4182 Altered mental status, unspecified: Secondary | ICD-10-CM | POA: Diagnosis not present

## 2020-11-07 DIAGNOSIS — J449 Chronic obstructive pulmonary disease, unspecified: Secondary | ICD-10-CM | POA: Diagnosis present

## 2020-11-07 DIAGNOSIS — Z20822 Contact with and (suspected) exposure to covid-19: Secondary | ICD-10-CM | POA: Diagnosis not present

## 2020-11-07 DIAGNOSIS — D62 Acute posthemorrhagic anemia: Secondary | ICD-10-CM | POA: Diagnosis present

## 2020-11-07 DIAGNOSIS — S0990XA Unspecified injury of head, initial encounter: Secondary | ICD-10-CM | POA: Diagnosis not present

## 2020-11-07 DIAGNOSIS — M810 Age-related osteoporosis without current pathological fracture: Secondary | ICD-10-CM | POA: Diagnosis present

## 2020-11-07 DIAGNOSIS — H5789 Other specified disorders of eye and adnexa: Secondary | ICD-10-CM | POA: Diagnosis present

## 2020-11-07 DIAGNOSIS — R52 Pain, unspecified: Secondary | ICD-10-CM

## 2020-11-07 DIAGNOSIS — Z7952 Long term (current) use of systemic steroids: Secondary | ICD-10-CM

## 2020-11-07 DIAGNOSIS — J90 Pleural effusion, not elsewhere classified: Secondary | ICD-10-CM | POA: Diagnosis not present

## 2020-11-07 DIAGNOSIS — Z90710 Acquired absence of both cervix and uterus: Secondary | ICD-10-CM

## 2020-11-07 DIAGNOSIS — K623 Rectal prolapse: Secondary | ICD-10-CM | POA: Diagnosis present

## 2020-11-07 DIAGNOSIS — F32A Depression, unspecified: Secondary | ICD-10-CM | POA: Diagnosis present

## 2020-11-07 DIAGNOSIS — Z794 Long term (current) use of insulin: Secondary | ICD-10-CM

## 2020-11-07 DIAGNOSIS — K862 Cyst of pancreas: Secondary | ICD-10-CM | POA: Diagnosis not present

## 2020-11-07 DIAGNOSIS — K922 Gastrointestinal hemorrhage, unspecified: Secondary | ICD-10-CM | POA: Diagnosis not present

## 2020-11-07 DIAGNOSIS — R102 Pelvic and perineal pain: Secondary | ICD-10-CM | POA: Diagnosis not present

## 2020-11-07 DIAGNOSIS — Z888 Allergy status to other drugs, medicaments and biological substances status: Secondary | ICD-10-CM

## 2020-11-07 DIAGNOSIS — S8992XA Unspecified injury of left lower leg, initial encounter: Secondary | ICD-10-CM | POA: Diagnosis not present

## 2020-11-07 DIAGNOSIS — M545 Low back pain, unspecified: Secondary | ICD-10-CM | POA: Diagnosis not present

## 2020-11-07 DIAGNOSIS — G4733 Obstructive sleep apnea (adult) (pediatric): Secondary | ICD-10-CM | POA: Diagnosis present

## 2020-11-07 DIAGNOSIS — Z880 Allergy status to penicillin: Secondary | ICD-10-CM

## 2020-11-07 DIAGNOSIS — N183 Chronic kidney disease, stage 3 unspecified: Secondary | ICD-10-CM | POA: Diagnosis present

## 2020-11-07 DIAGNOSIS — E119 Type 2 diabetes mellitus without complications: Secondary | ICD-10-CM

## 2020-11-07 DIAGNOSIS — W06XXXA Fall from bed, initial encounter: Secondary | ICD-10-CM | POA: Diagnosis present

## 2020-11-07 DIAGNOSIS — Z86711 Personal history of pulmonary embolism: Secondary | ICD-10-CM

## 2020-11-07 DIAGNOSIS — Z882 Allergy status to sulfonamides status: Secondary | ICD-10-CM

## 2020-11-07 LAB — CBC WITH DIFFERENTIAL/PLATELET
Abs Immature Granulocytes: 0.11 10*3/uL — ABNORMAL HIGH (ref 0.00–0.07)
Basophils Absolute: 0.1 10*3/uL (ref 0.0–0.1)
Basophils Relative: 1 %
Eosinophils Absolute: 0.1 10*3/uL (ref 0.0–0.5)
Eosinophils Relative: 1 %
HCT: 31.1 % — ABNORMAL LOW (ref 36.0–46.0)
Hemoglobin: 9.2 g/dL — ABNORMAL LOW (ref 12.0–15.0)
Immature Granulocytes: 1 %
Lymphocytes Relative: 13 %
Lymphs Abs: 1.4 10*3/uL (ref 0.7–4.0)
MCH: 22.4 pg — ABNORMAL LOW (ref 26.0–34.0)
MCHC: 29.6 g/dL — ABNORMAL LOW (ref 30.0–36.0)
MCV: 75.9 fL — ABNORMAL LOW (ref 80.0–100.0)
Monocytes Absolute: 0.5 10*3/uL (ref 0.1–1.0)
Monocytes Relative: 4 %
Neutro Abs: 9.1 10*3/uL — ABNORMAL HIGH (ref 1.7–7.7)
Neutrophils Relative %: 80 %
Platelets: 369 10*3/uL (ref 150–400)
RBC: 4.1 MIL/uL (ref 3.87–5.11)
RDW: 18.8 % — ABNORMAL HIGH (ref 11.5–15.5)
WBC: 11.2 10*3/uL — ABNORMAL HIGH (ref 4.0–10.5)
nRBC: 0 % (ref 0.0–0.2)

## 2020-11-07 LAB — BASIC METABOLIC PANEL
Anion gap: 10 (ref 5–15)
BUN: 12 mg/dL (ref 8–23)
CO2: 22 mmol/L (ref 22–32)
Calcium: 8.8 mg/dL — ABNORMAL LOW (ref 8.9–10.3)
Chloride: 102 mmol/L (ref 98–111)
Creatinine, Ser: 0.75 mg/dL (ref 0.44–1.00)
GFR, Estimated: >60 mL/min (ref >60)
Glucose, Bld: 137 mg/dL — ABNORMAL HIGH (ref 70–99)
Potassium: 3.7 mmol/L (ref 3.5–5.1)
Sodium: 134 mmol/L — ABNORMAL LOW (ref 135–145)

## 2020-11-07 LAB — CK: Total CK: 33 U/L — ABNORMAL LOW (ref 38–234)

## 2020-11-07 MED ORDER — OXYCODONE-ACETAMINOPHEN 5-325 MG PO TABS
1.0000 | ORAL_TABLET | Freq: Once | ORAL | Status: AC
Start: 2020-11-07 — End: 2020-11-07
  Administered 2020-11-07: 1 via ORAL
  Filled 2020-11-07: qty 1

## 2020-11-07 MED ORDER — SODIUM CHLORIDE 0.9 % IV BOLUS
1000.0000 mL | Freq: Once | INTRAVENOUS | Status: AC
  Administered 2020-11-07: 1000 mL via INTRAVENOUS

## 2020-11-07 NOTE — ED Notes (Signed)
Patient transported to X-ray 

## 2020-11-07 NOTE — [place] FL2 (Signed)
[place] LEVEL OF CARE SCREENING TOOL     IDENTIFICATION  Patient Name: [name] Birthdate: 08-12-1940 Sex: female Admission Date (Current Location): 11/07/2020  [facility] and [place] Number:  Herbalist and Address:  [facility], [place] [address], [place], [place] [address]      Provider Number: [id number]  Attending Physician Name and Address:  Tegeler, Gwenyth Allegra, *  Relative Name and Phone Number:  Quentin Ore son [phone]    Current Level of Care: Hospital Recommended Level of Care: Nursing Facility Prior Approval Number:    Date Approved/Denied:   PASRR Number: [id number]  Discharge Plan: Home    Current Diagnoses: Patient Active Problem List   Diagnosis Date Noted   Aortic atherosclerosis ([place]) 10/28/2020   Acute blood loss anemia 10/27/2020   AKI (acute kidney injury) ([place]) 10/27/2020   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   History of pulmonary embolism 10/27/2020   GI bleed 08/20/2020   Acute lower GI bleeding 08/19/2020   Acute pulmonary embolism ([place]) 07/21/2020   Hypoxia 05/23/2020   Acute hypoxemic respiratory failure ([place]) 05/22/2020   Rotator cuff arthropathy of left shoulder 12/13/2019   Pain in joint of right shoulder 12/10/2019   Pain in joint of left shoulder 12/10/2019   Hearing loss, sensorineural, 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   Tinnitus, left 01/08/2019   DM type 2, not at goal [facility]) 06/03/2018   Frequent infections 05/21/2018   Gangrene ([place]) 05/21/2018   Pain in finger 05/07/2018   Family history of aneurysm 05/07/2018   Diabetes mellitus due to underlying condition, controlled, with complication, without long-term current use of insulin ([place])    Lower urinary tract infectious disease    Palpitations 01/17/2018   Dehydration 01/17/2018   Hypotension 01/17/2018   Symptom of blood in stool 01/17/2018   Diarrhea in adult patient 01/17/2018   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    Aftercare following joint replacement 08/21/2017   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 with presence of antibiotic-impregnated cement spacer 06/05/2017   Closed displaced transverse fracture of right patella 05/06/2017   Anemia 04/16/2017   Chronic interstitial lung disease ([place]) 04/16/2017   Chronic kidney disease (CKD), stage III (moderate) ([place]) 04/16/2017   Other hyperlipidemia 04/16/2017   Type 2 diabetes mellitus without complication, with long-term current use of insulin ([place]) 04/16/2017   Pain in right knee 03/25/2017   Aftercare 03/25/2017   Arthritis, septic, knee ([place]) 03/09/2017   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   Fibromyalgia 06/04/2016   High risk medication use 06/04/2016   Claw toe, acquired, left 01/29/2016   Rheumatoid arthritis involving both feet with negative rheumatoid factor ([place]) 01/29/2016   Septic prepatellar bursitis of right knee 10/26/2014   Prepatellar bursitis of right knee 08/28/2014   DYSPHAGIA ORAL PHASE 06/18/2007   Asthma 05/14/2007   SLEEP APNEA 05/14/2007   Sleep apnea 05/14/2007   GERD 05/13/2007   Rheumatoid arthritis ([place]) 05/13/2007   OSTEOPOROSIS 05/13/2007   DIARRHEA, CHRONIC 05/13/2007    Orientation RESPIRATION BLADDER Height & Weight     Self, Time, Situation, Place  Normal Incontinent Weight: 147 lb 14.9 oz (67.1 kg) Height:  '5\' 5"'$  (165.1 cm)  BEHAVIORAL SYMPTOMS/MOOD NEUROLOGICAL BOWEL NUTRITION STATUS      Continent Diet (Carb modified)  AMBULATORY STATUS COMMUNICATION OF NEEDS Skin   Extensive Assist Verbally Normal  Personal Care Assistance Level of Assistance  Bathing, Feeding, Dressing Bathing Assistance: Limited assistance Feeding assistance: Independent Dressing Assistance: Maximum assistance     Functional Limitations Info  Sight, Hearing, Speech Sight Info: Adequate Hearing Info: Adequate Speech Info: Adequate    SPECIAL CARE  FACTORS FREQUENCY                       Contractures      Additional Factors Info  Code Status Code Status Info: Full Allergies Info: Penicii;;ins, Sulfamethoxazole-trimethoprim, Penicillin G Sodium, Sulfa Antibiotics, Sulfonamide Derivatives, Meperidine, Meperidine Hcl           Current Medications (11/07/2020):  This is the current hospital active medication list No current facility-administered medications for this encounter.   Current Outpatient Medications  Medication Sig Dispense Refill   acetaminophen (TYLENOL) 500 MG tablet Take 1,000 mg by mouth every 6 (six) hours as needed for mild pain (or headaches).     albuterol (VENTOLIN HFA) 108 (90 Base) MCG/ACT inhaler Inhale 2 puffs into the lungs every 6 (six) hours as needed for shortness of breath or wheezing.     arformoterol (BROVANA) 15 MCG/2ML NEBU Take 15 mcg by nebulization 2 (two) times daily as needed (for shortness of breath).     atorvastatin (LIPITOR) 20 MG tablet Take 20 mg by mouth daily.     B-D UF III MINI PEN NEEDLES 31G X 5 MM MISC SMARTSIG:1 Each SUB-Q 4 Times Daily     budesonide (PULMICORT) 0.25 MG/2ML nebulizer solution Take 2 mLs (0.25 mg total) by nebulization 2 (two) times daily. 120 mL 0   Cyanocobalamin (B-12) 2500 MCG TABS Take 2,500 mcg by mouth at bedtime.      DULoxetine (CYMBALTA) 60 MG capsule Take 60 mg by mouth at bedtime.      ezetimibe (ZETIA) 10 MG tablet Take 10 mg by mouth at bedtime.      ferrous gluconate (FERGON) 324 MG tablet Take 324 mg by mouth every other day.     ipratropium-albuterol (DUONEB) 0.5-2.5 (3) MG/3ML SOLN Take 3 mLs by nebulization every 6 (six) hours as needed (wheezing).     metFORMIN (GLUCOPHAGE) 500 MG tablet Take 500 mg by mouth 2 (two) times daily.     NOVOLIN 70/30 KWIKPEN (70-30) 100 UNIT/ML KwikPen Inject 14 Units into the skin 2 (two) times daily.     olopatadine (PATANOL) 0.1 % ophthalmic solution Place 1 drop into both eyes 2 (two) times daily. 5 mL 0    ondansetron (ZOFRAN-ODT) 8 MG disintegrating tablet Take 8 mg by mouth every 8 (eight) hours as needed for nausea or vomiting (dissolve orally).     pantoprazole (PROTONIX) 40 MG tablet Take one tablet once every morning (Patient taking differently: Take 40 mg by mouth daily before breakfast.) 30 tablet 5   predniSONE (DELTASONE) 5 MG tablet Take 5 mg by mouth at bedtime.     tolterodine (DETROL LA) 2 MG 24 hr capsule Take 2 mg by mouth every evening.       Discharge Medications: Please see discharge summary for a list of discharge medications.  Relevant Imaging Results:  Relevant Lab Results:   Additional Information SSN [id number] Per family Pt has received Covid vacine plus booster  Vergie Living, LCSW

## 2020-11-07 NOTE — Social Work (Signed)
CSW met with Pt at bedside.  Pt A&Ox4. Pt does not meet criteria for admission, but Pt has been experiencing physical decline at home. Per Pt, her PCP has recommended long-term nursing home care but Pt has been reluctant to such placement. CSW and Pt discussed pros and cons of long-term placement and discussed Pt's increasing physical needs. CSW called daughter [name] @ [phone] due to the fact that son, Fritz Pickerel was unreachable by phone at this time. Pam lives in [place], Fritz Pickerel lives across the street from [place].   CSW will leave hand-off for first shift CSW to assist with d/c in the morning. CSW wrote FL2 for nursing facility to assist family with placement needs.

## 2020-11-07 NOTE — ED Provider Notes (Addendum)
[facility] EMERGENCY DEPARTMENT Provider Note   CSN: [id number] Arrival date & time: 11/07/20  1752     History Chief Complaint  Patient presents with   Lytle Michaels    [name] is a 80 y.o. female with a past medical history as noted below who presents to the ED after a fall.  Patient states she rolled out of her bed.  Patient notes she was lying on the floor for roughly 5 to 6 hours because she was unable to get up.  Patient endorses a posterior headache, bilateral knee pain, mid and low back pain.  Denies numbness/tingling.  No urinary incontinence.  No saddle paresthesias.  Patient denies visual changes, speech changes, or unilateral weakness.  No dizziness.  She typically gets around with an IT trainer wheelchair.  Denies chest pain and shortness of breath.  Chart reviewed.  Patient was recently admitted to the hospital on 8/19-8/22 due to GI bleed where she was discontinued on her Eliquis.  Patient denies any loss of consciousness during her fall.  She notes she hit her head on the floor.  No treatment prior to arrival.  No aggravating or alleviating factors.  History obtained from patient and past medical records. No interpreter used during encounter.       Past Medical History:  Diagnosis Date   Acute pulmonary embolism ([place])    b/l; dx'ed May '22   Asthma    Complication of anesthesia    Anesthesia told her years ago she got too cold during surgery   Diabetes mellitus without complication ([place])    Emphysema    Fibromyalgia    Osteoporosis    Rheumatoid arthritis(714.0)    Sleep apnea    no cpap    Patient Active Problem List   Diagnosis Date Noted   Aortic atherosclerosis ([place]) 10/28/2020   Acute blood loss anemia 10/27/2020   AKI (acute kidney injury) ([place]) 10/27/2020   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   History of pulmonary embolism 10/27/2020   GI bleed 08/20/2020   Acute lower GI bleeding 08/19/2020   Acute pulmonary embolism ([place]) 07/21/2020    Hypoxia 05/23/2020   Acute hypoxemic respiratory failure ([place]) 05/22/2020   Rotator cuff arthropathy of left shoulder 12/13/2019   Pain in joint of right shoulder 12/10/2019   Pain in joint of left shoulder 12/10/2019   Hearing loss, sensorineural, 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   Tinnitus, left 01/08/2019   DM type 2, not at goal [facility]) 06/03/2018   Frequent infections 05/21/2018   Gangrene ([place]) 05/21/2018   Pain in finger 05/07/2018   Family history of aneurysm 05/07/2018   Diabetes mellitus due to underlying condition, controlled, with complication, without long-term current use of insulin ([place])    Lower urinary tract infectious disease    Palpitations 01/17/2018   Dehydration 01/17/2018   Hypotension 01/17/2018   Symptom of blood in stool 01/17/2018   Diarrhea in adult patient 01/17/2018   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   Aftercare following joint replacement 08/21/2017   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 with presence of antibiotic-impregnated cement spacer 06/05/2017   Closed displaced transverse fracture of right patella 05/06/2017   Anemia 04/16/2017   Chronic interstitial lung disease ([place]) 04/16/2017   Chronic kidney disease (CKD), stage III (moderate) ([place]) 04/16/2017   Other hyperlipidemia 04/16/2017   Type 2 diabetes mellitus without complication, with long-term current use of insulin ([place]) 04/16/2017   Pain in right knee 03/25/2017   Aftercare  03/25/2017   Arthritis, septic, knee ([place]) 03/09/2017   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   Fibromyalgia 06/04/2016   High risk medication use 06/04/2016   Claw toe, acquired, left 01/29/2016   Rheumatoid arthritis involving both feet with negative rheumatoid factor ([place]) 01/29/2016   Septic prepatellar bursitis of right knee 10/26/2014   Prepatellar bursitis of right knee 08/28/2014   DYSPHAGIA ORAL PHASE 06/18/2007   Asthma 05/14/2007   SLEEP APNEA  05/14/2007   Sleep apnea 05/14/2007   GERD 05/13/2007   Rheumatoid arthritis ([place]) 05/13/2007   OSTEOPOROSIS 05/13/2007   DIARRHEA, CHRONIC 05/13/2007    Past Surgical History:  Procedure Laterality Date   ABDOMINAL HYSTERECTOMY     ANKLE FRACTURE SURGERY  2007   BREAST SURGERY     mammoplasty and then removed   COLONOSCOPY WITH PROPOFOL N/A 08/22/2020   Procedure: COLONOSCOPY WITH PROPOFOL;  Surgeon: Ronnette Juniper, MD;  Location: [facility];  Service: Gastroenterology;  Laterality: N/A;  If schedule availability permits, would like to add on upper endoscopy as well   ESOPHAGOGASTRODUODENOSCOPY (EGD) WITH PROPOFOL N/A 08/22/2020   Procedure: ESOPHAGOGASTRODUODENOSCOPY (EGD) WITH PROPOFOL;  Surgeon: Ronnette Juniper, MD;  Location: [facility];  Service: Gastroenterology;  Laterality: N/A;   FOOT SURGERY     numerous   GIVENS CAPSULE STUDY N/A 10/28/2020   Procedure: GIVENS CAPSULE STUDY;  Surgeon: Otis Brace, MD;  Location: [facility];  Service: Gastroenterology;  Laterality: N/A;   HOT HEMOSTASIS N/A 08/22/2020   Procedure: HOT HEMOSTASIS (ARGON PLASMA COAGULATION/BICAP);  Surgeon: Ronnette Juniper, MD;  Location: [facility];  Service: Gastroenterology;  Laterality: N/A;   I & D EXTREMITY Right 10/26/2014   Procedure: IRRIGATION AND DEBRIDEMENT RIGHT KNEE ;  Surgeon: Gaynelle Arabian, MD;  Location: [facility];  Service: Orthopedics;  Laterality: Right;   IRRIGATION AND DEBRIDEMENT KNEE Right 03/09/2017   Procedure: IRRIGATION AND DEBRIDEMENT RIGHT KNEE PRE-PATELLAR BURSA;  Surgeon: Susa Day, MD;  Location: [facility];  Service: Orthopedics;  Laterality: Right;   KNEE BURSECTOMY Right 09/16/2014   Procedure: EXCISON OF PRE PATELLA BURSA RIGHT KNEE ;  Surgeon: Gaynelle Arabian, MD;  Location: [facility];  Service: Orthopedics;  Laterality: Right;   PARTIAL HYSTERECTOMY  1970   POLYPECTOMY  08/22/2020   Procedure: POLYPECTOMY;  Surgeon: Ronnette Juniper, MD;  Location: [facility];  Service:  Gastroenterology;;   TOTAL KNEE ARTHROPLASTY  2005 / 2006   x2     OB History   No obstetric history on file.     Family History  Problem Relation Age of Onset   Hypertension Other    Diabetes Other    Stroke Other    Diabetes Mother    Diabetes Father    Diabetes Sister    Diabetes Maternal Aunt    Cancer Paternal Uncle    Cancer Paternal Grandmother    Breast cancer Paternal Grandmother     Social History   Tobacco Use   Smoking status: Former    Types: Cigarettes    Quit date: 03/11/1994    Years since quitting: 26.6   Smokeless tobacco: Never  Vaping Use   Vaping Use: Never used  Substance Use Topics   Alcohol use: No   Drug use: No    Home Medications Prior to Admission medications   Medication Sig Start Date End Date Taking? Authorizing Provider  acetaminophen (TYLENOL) 500 MG tablet Take 1,000 mg by mouth every 6 (six) hours as needed for mild pain (or headaches).    [provider]  albuterol (VENTOLIN HFA) 108 (90 Base) MCG/ACT inhaler Inhale 2 puffs into the lungs every 6 (six) hours as needed for shortness of breath or wheezing. 07/05/19   [provider]  arformoterol (BROVANA) 15 MCG/2ML NEBU Take 15 mcg by nebulization 2 (two) times daily as needed (for shortness of breath).    [provider]  atorvastatin (LIPITOR) 20 MG tablet Take 20 mg by mouth daily. 10/23/20   [provider]  B-D UF III MINI PEN NEEDLES 31G X 5 MM MISC SMARTSIG:1 Each SUB-Q 4 Times Daily 10/22/19   [provider]  budesonide (PULMICORT) 0.25 MG/2ML nebulizer solution Take 2 mLs (0.25 mg total) by nebulization 2 (two) times daily. 05/26/20 10/27/21  [name], MD  Cyanocobalamin (B-12) 2500 MCG TABS Take 2,500 mcg by mouth at bedtime.     [provider]  DULoxetine (CYMBALTA) 60 MG capsule Take 60 mg by mouth at bedtime.     [provider]  ezetimibe (ZETIA) 10 MG tablet Take 10 mg by mouth at bedtime.  11/04/16    [provider]  ferrous gluconate (FERGON) 324 MG tablet Take 324 mg by mouth every other day.    [provider]  ipratropium-albuterol (DUONEB) 0.5-2.5 (3) MG/3ML SOLN Take 3 mLs by nebulization every 6 (six) hours as needed (wheezing). 07/19/20   [provider]  metFORMIN (GLUCOPHAGE) 500 MG tablet Take 500 mg by mouth 2 (two) times daily. 10/25/20   [provider]  NOVOLIN 70/30 KWIKPEN (70-30) 100 UNIT/ML KwikPen Inject 14 Units into the skin 2 (two) times daily. 10/20/20   [provider]  olopatadine (PATANOL) 0.1 % ophthalmic solution Place 1 drop into both eyes 2 (two) times daily. 10/30/20   Samuella Cota, MD  ondansetron (ZOFRAN-ODT) 8 MG disintegrating tablet Take 8 mg by mouth every 8 (eight) hours as needed for nausea or vomiting (dissolve orally). 10/24/20   [provider]  pantoprazole (PROTONIX) 40 MG tablet Take one tablet once every morning Patient taking differently: Take 40 mg by mouth daily before breakfast. 05/26/20   [name], MD  predniSONE (DELTASONE) 5 MG tablet Take 5 mg by mouth at bedtime.    [provider]  tolterodine (DETROL LA) 2 MG 24 hr capsule Take 2 mg by mouth every evening. 08/15/20   [provider]  MYRBETRIQ 50 MG TB24 tablet Take 50 mg by mouth at bedtime. Patient not taking: Reported on 08/19/2020 03/29/20 08/19/20  [provider]    Allergies    Penicillins, Sulfamethoxazole-trimethoprim, Adalimumab, Hydroxychloroquine sulfate, Meperidine, Meperidine hcl, Methotrexate, Penicillin g sodium, Phentermine-topiramate, Sulfa antibiotics, and Sulfonamide derivatives  Review of Systems   Review of Systems  Eyes:  Negative for visual disturbance.  Respiratory:  Negative for shortness of breath.   Cardiovascular:  Negative for chest pain.  Gastrointestinal:  Negative for abdominal pain, nausea and vomiting.  Musculoskeletal:  Positive for arthralgias and back pain.   Neurological:  Positive for headaches. Negative for dizziness, speech difficulty, weakness and numbness.  All other systems reviewed and are negative.  Physical Exam Updated Vital Signs BP 95/71   Pulse 92   Temp 98.7 F (37.1 C) (Oral)   Resp (!) 28   Ht '5\' 5"'$  (1.651 m)   Wt 67.1 kg   SpO2 90%   BMI 24.62 kg/m   Physical Exam Vitals and nursing note reviewed.  Constitutional:      General: She is not in acute distress.    Appearance: She  is not ill-appearing.  HENT:     Head: Normocephalic.  Eyes:     Pupils: Pupils are equal, round, and reactive to light.  Neck:     Comments: No cervical midline tenderness Cardiovascular:     Rate and Rhythm: Normal rate and regular rhythm.     Pulses: Normal pulses.     Heart sounds: Normal heart sounds. No murmur heard.   No friction rub. No gallop.  Pulmonary:     Effort: Pulmonary effort is normal.     Breath sounds: Normal breath sounds.  Abdominal:     General: Abdomen is flat. There is no distension.     Palpations: Abdomen is soft.     Tenderness: There is no abdominal tenderness. There is no guarding or rebound.  Musculoskeletal:        General: Normal range of motion.     Cervical back: Neck supple.     Comments: Thoracic and lumbar midline tenderness. TTP throughout right hip.  Skin:    General: Skin is warm and dry.  Neurological:     General: No focal deficit present.     Mental Status: She is alert.     Comments: Speech is clear, able to follow commands CN III-XII intact strong and equal grip strength Sensation grossly intact throughout Moves extremities without ataxia, coordination intact No pronator drift   Psychiatric:        Mood and Affect: Mood normal.        Behavior: Behavior normal.    ED Results / Procedures / Treatments   Labs (all labs ordered are listed, but only abnormal results are displayed) Labs Reviewed  CK - Abnormal; Notable for the following components:      Result Value   Total  CK 33 (*)    All other components within normal limits  CBC WITH DIFFERENTIAL/PLATELET - Abnormal; Notable for the following components:   WBC 11.2 (*)    Hemoglobin 9.2 (*)    HCT 31.1 (*)    MCV 75.9 (*)    MCH 22.4 (*)    MCHC 29.6 (*)    RDW 18.8 (*)    Neutro Abs 9.1 (*)    Abs Immature Granulocytes 0.11 (*)    All other components within normal limits  BASIC METABOLIC PANEL - Abnormal; Notable for the following components:   Sodium 134 (*)    Glucose, Bld 137 (*)    Calcium 8.8 (*)    All other components within normal limits    EKG EKG Interpretation  Date/Time:  Tuesday November 07 2020 18:44:41 EDT Ventricular Rate:  100 PR Interval:  175 QRS Duration: 115 QT Interval:  363 QTC Calculation: 469 R Axis:   -65 Text Interpretation: Sinus tachycardia Atrial premature complexes Incomplete left bundle branch block Probable left ventricular hypertrophy ST elevation, consider inferior injury When compared to prior, similar appearance. No STEMI Confirmed by Antony Blackbird [phone]) on 11/07/2020 9:01:21 PM  Radiology DG Chest 1 View  Result Date: 11/07/2020 CLINICAL DATA:  Fall out of bed tonight. EXAM: CHEST  1 VIEW COMPARISON:  October 27, 2020. FINDINGS: Stable cardiomediastinal silhouette. Both lungs are clear. The visualized skeletal structures are unremarkable. IMPRESSION: No active disease. Electronically Signed   By: Marijo Conception M.D.   On: 11/07/2020 20:05   DG Pelvis 1-2 Views  Result Date: 11/07/2020 CLINICAL DATA:  Bilateral hip pain after fall out of bed tonight. EXAM: PELVIS - 1-2 VIEW COMPARISON:  None. FINDINGS: There is  no evidence of pelvic fracture or diastasis. No pelvic bone lesions are seen. IMPRESSION: Negative. Electronically Signed   By: Marijo Conception M.D.   On: 11/07/2020 20:06   CT HEAD WO CONTRAST (5MM)  Result Date: 11/07/2020 CLINICAL DATA:  Head injury after fall out of bed. EXAM: CT HEAD WITHOUT CONTRAST CT CERVICAL SPINE WITHOUT CONTRAST  TECHNIQUE: Multidetector CT imaging of the head and cervical spine was performed following the standard protocol without intravenous contrast. Multiplanar CT image reconstructions of the cervical spine were also generated. COMPARISON:  Jul 24, 2020. FINDINGS: CT HEAD FINDINGS Brain: Mild chronic ischemic white matter disease is noted. No mass effect or midline shift is noted. Ventricular size is within normal limits. There is no evidence of mass lesion, hemorrhage or acute infarction. Vascular: No hyperdense vessel or unexpected calcification. Skull: Normal. Negative for fracture or focal lesion. Sinuses/Orbits: No acute finding. Other: None. CT CERVICAL SPINE FINDINGS Alignment: Minimal grade 1 retrolisthesis of C2-3 is noted secondary to moderate degenerative disc disease at this level. Mild grade 1 anterolisthesis is noted at C4-5 and C5-6 and C6-7 secondary to posterior facet joint hypertrophy. Skull base and vertebrae: No acute fracture. No primary bone lesion or focal pathologic process. Soft tissues and spinal canal: No prevertebral fluid or swelling. No visible canal hematoma. Disc levels: Moderate degenerative disc disease is noted at C4-5, C5-6 and C6-7. Upper chest: Negative. Other: None. IMPRESSION: No acute intracranial abnormality seen. Moderate multilevel degenerative disc disease. No acute abnormality seen in the cervical spine. Electronically Signed   By: Marijo Conception M.D.   On: 11/07/2020 20:15   CT Cervical Spine Wo Contrast  Result Date: 11/07/2020 CLINICAL DATA:  Head injury after fall out of bed. EXAM: CT HEAD WITHOUT CONTRAST CT CERVICAL SPINE WITHOUT CONTRAST TECHNIQUE: Multidetector CT imaging of the head and cervical spine was performed following the standard protocol without intravenous contrast. Multiplanar CT image reconstructions of the cervical spine were also generated. COMPARISON:  Jul 24, 2020. FINDINGS: CT HEAD FINDINGS Brain: Mild chronic ischemic white matter disease is  noted. No mass effect or midline shift is noted. Ventricular size is within normal limits. There is no evidence of mass lesion, hemorrhage or acute infarction. Vascular: No hyperdense vessel or unexpected calcification. Skull: Normal. Negative for fracture or focal lesion. Sinuses/Orbits: No acute finding. Other: None. CT CERVICAL SPINE FINDINGS Alignment: Minimal grade 1 retrolisthesis of C2-3 is noted secondary to moderate degenerative disc disease at this level. Mild grade 1 anterolisthesis is noted at C4-5 and C5-6 and C6-7 secondary to posterior facet joint hypertrophy. Skull base and vertebrae: No acute fracture. No primary bone lesion or focal pathologic process. Soft tissues and spinal canal: No prevertebral fluid or swelling. No visible canal hematoma. Disc levels: Moderate degenerative disc disease is noted at C4-5, C5-6 and C6-7. Upper chest: Negative. Other: None. IMPRESSION: No acute intracranial abnormality seen. Moderate multilevel degenerative disc disease. No acute abnormality seen in the cervical spine. Electronically Signed   By: Marijo Conception M.D.   On: 11/07/2020 20:15   CT Thoracic Spine Wo Contrast  Result Date: 11/07/2020 CLINICAL DATA:  Mid-back pain EXAM: CT THORACIC SPINE WITHOUT CONTRAST TECHNIQUE: Multidetector CT images of the thoracic were obtained using the standard protocol without intravenous contrast. COMPARISON:  None. FINDINGS: Alignment: No substantial sagittal subluxation in the thoracic spine. Mild S-shaped thoracic curvature. Vertebrae: Vertebral body heights are maintained. No evidence of acute fracture Paraspinal and other soft tissues: Partially imaged layering small bilateral  pleural effusions with overlying opacities. Disc levels: Multilevel degenerative disc disease, greatest and moderate at T5-T6. IMPRESSION: 1. No evidence of acute fracture or traumatic malalignment. 2. Multilevel degenerative disc disease, greatest and moderate at T5-T6. 3. Partially imaged  layering small bilateral pleural effusions with overlying opacities. Electronically Signed   By: Margaretha Sheffield M.D.   On: 11/07/2020 20:04   CT Lumbar Spine Wo Contrast  Result Date: 11/07/2020 CLINICAL DATA:  Low back pain, trauma EXAM: CT LUMBAR SPINE WITHOUT CONTRAST TECHNIQUE: Multidetector CT imaging of the lumbar spine was performed without intravenous contrast administration. Multiplanar CT image reconstructions were also generated. COMPARISON:  CT abdomen/pelvis 10/27/2020. FINDINGS: Segmentation: 5 lumbar type vertebral bodies. Alignment: Similar alignment. Slight retrolisthesis of L5-S1.Dextrocurvature, centered at L3-L4. Vertebrae: Vertebral body heights are maintained. No evidence of acute fracture. Osteopenia. Paraspinal and other soft tissues: Aorto bi-iliac calcific atherosclerosis. Disc levels: Severe multilevel degenerative disc disease with disc height loss, endplate sclerosis vacuum disc phenomenon and posterior disc/osteophyte complexes. Multilevel facet arthropathy. Resulting multilevel foraminal stenosis, greatest and likely severe on the right at L5-S1. Multilevel disc bulges, including right inferiorly dissecting paracentral disc protrusion at L1-L2 that narrows the right subarticular recess. IMPRESSION: 1. No evidence of acute fracture or traumatic malalignment. 2. Severe multilevel degenerative disease with multilevel disc bulges, including right inferiorly dissecting paracentral disc protrusion at L1-L2. Multilevel foraminal stenosis, greatest and likely severe on the right at L5-S1. MRI of the lumbar spine could further characterize the canal and foramina if clinically indicated. 3. Dextrocurvature, centered at L3-L4. Electronically Signed   By: Margaretha Sheffield M.D.   On: 11/07/2020 20:17   DG Knee Complete 4 Views Left  Result Date: 11/07/2020 CLINICAL DATA:  Trauma to the left knee. EXAM: LEFT KNEE - COMPLETE 4+ VIEW COMPARISON:  Left knee radiograph dated 06/02/2007  FINDINGS: There is a total left knee arthroplasty. The arthroplasty appear intact in anatomic alignment. No evidence of loosening. There is no acute fracture or dislocation. Old healed fracture of proximal fibula. Bones are osteopenic. Soft tissues are unremarkable. No joint effusion. IMPRESSION: 1. No acute fracture or dislocation. 2. Total left knee arthroplasty appears intact and in anatomic alignment. Electronically Signed   By: [name] M.D.   On: 11/07/2020 20:02   DG Knee Complete 4 Views Right  Result Date: 11/07/2020 CLINICAL DATA:  Bilateral knee pain after fall out of bed tonight. EXAM: RIGHT KNEE - COMPLETE 4+ VIEW COMPARISON:  March 09, 2017. FINDINGS: There has been interval revision of right hip arthroplasty with long stem femoral and tibial components. No acute fracture or dislocation is noted. Vascular calcifications are noted. IMPRESSION: Postsurgical changes as described above. No definite acute abnormality seen. Electronically Signed   By: Marijo Conception M.D.   On: 11/07/2020 20:00    Procedures Procedures   Medications Ordered in ED Medications  oxyCODONE-acetaminophen (PERCOCET/ROXICET) 5-325 MG per tablet 1 tablet (1 tablet Oral Given 11/07/20 1943)  sodium chloride 0.9 % bolus 1,000 mL (0 mLs Intravenous Stopped 11/07/20 2130)    ED Course  I have reviewed the triage vital signs and the nursing notes.  Pertinent labs & imaging results that were available during my care of the patient were reviewed by me and considered in my medical decision making (see chart for details).  Clinical Course as of 11/08/20 0014  Tue Nov 07, 2020  2013 WBC(!): 11.2 [CA]  2034 Hemoglobin(!): 9.2 [CA]  2109 Attempted to call son to go over results. Will try again  soon [CA]    Clinical Course User Index [CA] Karie Kirks   MDM Rules/Calculators/A&P                          80 year old female presents to the ED after rolling out of her bed.  Patient was lying on  the floor for roughly 5 to 6 hours status post fall.  Patient was recently discontinued on Eliquis roughly a week ago during her most recent admission due to GI bleed.  No LOC.  Upon arrival, stable vitals.  Patient is afebrile, not tachycardic or hypoxic.  Patient in no acute distress.  Physical exam significant for thoracic and lumbar midline tenderness.  Tenderness palpation to right hip.  Normal speech.  No facial droop.  No signs of basilar skull fracture.  CT images ordered to rule out bony fractures or acute intracranial abnormalities.  X-rays of bilateral knee, hips, and chest rule out acute abnormalities.  IV fluids started. Routine labs and CK ordered to rule out rhabdomyolysis.  CT images personally reviewed which are negative for any acute abnormalities.  No bony fractures.  X-rays reviewed which are negative for any acute abnormalities.  CBC significant for mild leukocytosis at 11.2.  Anemia with hemoglobin at 9.2 which appears better than previous.  CK without signs of rhabdomyolysis.  BMP significant for hyponatremia 134.  Hyperglycemia 137 no anion gap.  Low suspicion for DKA.  Unable to get in touch with patient's son, Fritz Pickerel who helps take care of patient. Spoke to patient's daughter, [name], who notes patient is unsafe to go home and needs a SNF placement. Pam lives in [place] and notes that patient has worsening weakness and has been unable to take care of herself over the past few months. CSW and PT consulted.   Discussed with CSW who recommends holding over night until able to reach son to pick patient up.   Please call son in the AM to pick patient up. Patient handed off to Charlann Lange who will call son in the morning.  Final Clinical Impression(s) / ED Diagnoses Final diagnoses:  Fall, initial encounter    Rx / DC Orders ED Discharge Orders     None        Suzy Bouchard, PA-C 11/07/20 2359    Suzy Bouchard, PA-C 11/08/20 0015    Tegeler, Gwenyth Allegra,  MD 11/08/20 [phone]

## 2020-11-07 NOTE — Discharge Instructions (Addendum)
Contact a health care provider if: You develop new bleeding anywhere in the body. Get help right away if: You are very weak. You are short of breath. You have pain in your abdomen or chest. You are dizzy or feel faint. You have trouble concentrating. You have bloody stools, black stools, or tarry stools. You vomit repeatedly or you vomit up blood.

## 2020-11-07 NOTE — ED Triage Notes (Signed)
Pt BIB [facility] from home for a fall from bed. EMS reported that the pt stated she rolled out of bed and hit her head on the floor but could not get up and could not get to a phone. She states that she laid on the floor for 5.5 hrs until son came over to check on her.  He helped her back to bed and she decided to call EMS because pain was increasing from fall.  PT is currently complaining of bilateral knee pain, lumbar back pain and head pain.  Timeline is fuzzy but pt states she was taken off of her eliquis sometime in the last 3 weeks. Pt was recently hospitalized for PE.  EMS states pt stated she was last taken off Eliquis 1 week ago r/t GI bleed.   EMS had a difficult time assessing an accurate BP.  HR was 100, 02 sats 100% CBG 179.  Pt has 18G in right AC.

## 2020-11-08 LAB — CBC
HCT: 30.9 % — ABNORMAL LOW (ref 36.0–46.0)
Hemoglobin: 8.4 g/dL — ABNORMAL LOW (ref 12.0–15.0)
MCH: 22.3 pg — ABNORMAL LOW (ref 26.0–34.0)
MCHC: 27.2 g/dL — ABNORMAL LOW (ref 30.0–36.0)
MCV: 82 fL (ref 80.0–100.0)
Platelets: 364 10*3/uL (ref 150–400)
RBC: 3.77 MIL/uL — ABNORMAL LOW (ref 3.87–5.11)
RDW: 19.3 % — ABNORMAL HIGH (ref 11.5–15.5)
WBC: 11.1 10*3/uL — ABNORMAL HIGH (ref 4.0–10.5)
nRBC: 0 % (ref 0.0–0.2)

## 2020-11-08 LAB — CBG MONITORING, ED
Glucose-Capillary: 134 mg/dL — ABNORMAL HIGH (ref 70–99)
Glucose-Capillary: 143 mg/dL — ABNORMAL HIGH (ref 70–99)
Glucose-Capillary: 158 mg/dL — ABNORMAL HIGH (ref 70–99)

## 2020-11-08 LAB — HEMOGLOBIN A1C
Hgb A1c MFr Bld: 6.3 % — ABNORMAL HIGH (ref 4.8–5.6)
Mean Plasma Glucose: 134.11 mg/dL

## 2020-11-08 MED ORDER — METFORMIN HCL 500 MG PO TABS
500.0000 mg | ORAL_TABLET | Freq: Two times a day (BID) | ORAL | Status: DC
  Administered 2020-11-08 – 2020-11-09 (×3): 500 mg via ORAL
  Filled 2020-11-08 (×3): qty 1

## 2020-11-08 MED ORDER — FESOTERODINE FUMARATE ER 4 MG PO TB24
4.0000 mg | ORAL_TABLET | Freq: Every day | ORAL | Status: DC
  Administered 2020-11-08 – 2020-11-09 (×2): 4 mg via ORAL
  Filled 2020-11-08 (×2): qty 1

## 2020-11-08 MED ORDER — ATORVASTATIN CALCIUM 10 MG PO TABS
20.0000 mg | ORAL_TABLET | Freq: Every day | ORAL | Status: DC
  Administered 2020-11-08 – 2020-11-16 (×8): 20 mg via ORAL
  Filled 2020-11-08 (×9): qty 2

## 2020-11-08 MED ORDER — ALBUTEROL SULFATE HFA 108 (90 BASE) MCG/ACT IN AERS: 2.0000 | INHALATION_SPRAY | Freq: Four times a day (QID) | RESPIRATORY_TRACT | Status: DC | PRN

## 2020-11-08 MED ORDER — ONDANSETRON 4 MG PO TBDP
8.0000 mg | ORAL_TABLET | Freq: Three times a day (TID) | ORAL | Status: DC | PRN
  Administered 2020-11-09: 8 mg via ORAL
  Filled 2020-11-08: qty 2

## 2020-11-08 MED ORDER — VITAMIN B-12 1000 MCG PO TABS
2500.0000 ug | ORAL_TABLET | Freq: Every day | ORAL | Status: DC
  Administered 2020-11-08 – 2020-11-15 (×7): 2500 ug via ORAL
  Filled 2020-11-08 (×7): qty 3

## 2020-11-08 MED ORDER — EZETIMIBE 10 MG PO TABS
10.0000 mg | ORAL_TABLET | Freq: Every day | ORAL | Status: DC
  Administered 2020-11-08 – 2020-11-15 (×9): 10 mg via ORAL
  Filled 2020-11-08 (×9): qty 1

## 2020-11-08 MED ORDER — OLOPATADINE HCL 0.1 % OP SOLN
1.0000 [drp] | Freq: Two times a day (BID) | OPHTHALMIC | Status: DC | PRN
  Administered 2020-11-11 – 2020-11-12 (×3): 1 [drp] via OPHTHALMIC
  Filled 2020-11-08: qty 5

## 2020-11-08 MED ORDER — ACETAMINOPHEN 500 MG PO TABS
1000.0000 mg | ORAL_TABLET | Freq: Four times a day (QID) | ORAL | Status: DC | PRN
  Administered 2020-11-08 – 2020-11-09 (×2): 1000 mg via ORAL
  Filled 2020-11-08 (×2): qty 2

## 2020-11-08 MED ORDER — PANTOPRAZOLE SODIUM 40 MG PO TBEC
40.0000 mg | DELAYED_RELEASE_TABLET | Freq: Every day | ORAL | Status: DC
  Administered 2020-11-09: 40 mg via ORAL
  Filled 2020-11-08 (×2): qty 1

## 2020-11-08 MED ORDER — INSULIN ASPART 100 UNIT/ML IJ SOLN
0.0000 [IU] | Freq: Three times a day (TID) | INTRAMUSCULAR | Status: DC
  Administered 2020-11-08 (×2): 1 [IU] via SUBCUTANEOUS

## 2020-11-08 MED ORDER — SODIUM CHLORIDE 0.9 % IV BOLUS
500.0000 mL | Freq: Once | INTRAVENOUS | Status: AC
  Administered 2020-11-08: 500 mL via INTRAVENOUS

## 2020-11-08 MED ORDER — ALBUTEROL SULFATE (2.5 MG/3ML) 0.083% IN NEBU
2.5000 mg | INHALATION_SOLUTION | Freq: Four times a day (QID) | RESPIRATORY_TRACT | Status: DC | PRN
  Administered 2020-11-08: 2.5 mg via RESPIRATORY_TRACT
  Filled 2020-11-08: qty 3

## 2020-11-08 MED ORDER — DULOXETINE HCL 60 MG PO CPEP
60.0000 mg | ORAL_CAPSULE | Freq: Every day | ORAL | Status: DC
  Administered 2020-11-08 – 2020-11-15 (×9): 60 mg via ORAL
  Filled 2020-11-08 (×9): qty 1

## 2020-11-08 MED ORDER — METFORMIN HCL 500 MG PO TABS: 500.0000 mg | ORAL_TABLET | Freq: Two times a day (BID) | ORAL | Status: DC

## 2020-11-08 MED ORDER — FERROUS GLUCONATE 324 (38 FE) MG PO TABS
324.0000 mg | ORAL_TABLET | ORAL | Status: DC
  Administered 2020-11-08: 324 mg via ORAL
  Filled 2020-11-08: qty 1

## 2020-11-08 MED ORDER — B-12 2500 MCG PO TABS: 2500.0000 ug | ORAL_TABLET | Freq: Every day | ORAL | Status: DC

## 2020-11-08 NOTE — ED Notes (Signed)
Moved to regular hospital bed, "feel better", SW at [facility] updating pt on plan (maybe [facility] tomorrow???), pending dinner, phone charging to her laptop. Alert, NAD, calm, interactive, resps e/u.

## 2020-11-08 NOTE — ED Notes (Signed)
PT at BS.

## 2020-11-08 NOTE — ED Notes (Addendum)
Son [name] updated via phone.

## 2020-11-08 NOTE — Progress Notes (Signed)
Physical Therapy Evaluation Patient Details Name: [name] MRN: [id number] DOB: 02-16-1941 Today's Date: 11/08/2020   History of Present Illness  Pt is a 80yo female presenting to [facility] ED on 8/30 after a fall out of bed, laying on floor for 5+ hours. Imaging negative for acute findings. PMH: recent hospital 8/19-22 for GI bleed,  PE 07/2020, asthma, DM, fibromyalgia, RA, sleep apnea.  Clinical Impression  Pt presents with the impairments above and problems listed below. Required mod assist +2 for bed mobility and max assist +2 for attempted sit to stand transfer. Pt unable to stand secondary to pain. Pt exhibiting tremors of bilateral upper extremities and was unable to steady her hands to drink from a cup. Pt with decreased safety awareness and awareness of deficits despite education about current impairments from PT. Educated about recommendations for SNF as pt a very high fall risk; however, pt may refuse. If pt refuses, will require max HH services. We will continue to follow her acutely to promote independence with functional mobility.    Follow Up Recommendations SNF;Supervision/Assistance - 24 hour (If pt refuses, will need max [facility])    Equipment Recommendations  Other (comment);Hospital bed (hoyer lift with pad)    Recommendations for Other Services       Precautions / Restrictions Precautions Precautions: Fall Precaution Comments: Recent fall, generalized weakness Restrictions Weight Bearing Restrictions: No      Mobility  Bed Mobility Overal bed mobility: Needs Assistance Bed Mobility: Supine to Sit;Sit to Supine     Supine to sit: +2 for physical assistance;Mod assist Sit to supine: Mod assist;+2 for physical assistance   General bed mobility comments: Pt required mod assist +2 for physical assist for LE advancement off bed and trunk control to acheive upright sitting. For sit to supine pt required mod assist +2 for LE lift back to bed level and trunk control  during descent.    Transfers Overall transfer level: Needs assistance               General transfer comment: Sit to stand transfer attempted with max assist +2 but pt reported increased pain and inability to stand. Refused additional attempts.  Ambulation/Gait                Stairs            Wheelchair Mobility    Modified Rankin (Stroke Patients Only)       Balance Overall balance assessment: Needs assistance Sitting-balance support: Feet unsupported;Single extremity supported Sitting balance-Leahy Scale: Poor Sitting balance - Comments: Pt reliant on UE support and external support at times for sitting balance       Standing balance comment: Pt unable to stand                             Pertinent Vitals/Pain Pain Assessment: 0-10 Pain Score: 6  Pain Location: bilateral knees Pain Descriptors / Indicators: Grimacing;Guarding;Moaning Pain Intervention(s): Limited activity within patient's tolerance;Monitored during session;Repositioned    Home Living Family/patient expects to be discharged to:: Private residence Living Arrangements: Alone Available Help at Discharge: Family;Available PRN/intermittently (Son Fritz Pickerel across the street) Type of Home: Mobile home Home Access: Ramped entrance     Home Layout: One level Home Equipment: Kasandra Knudsen - single point;Walker - 2 wheels;Wheelchair - power;Bedside commode;Shower seat      Prior Function Level of Independence: Independent with assistive device(s)         Comments:  Uses motorized WC at home and pt reports she is able to transfer independently, independent with bathing/dressing, still driving, uses motorized cart at grocery store.     Hand Dominance   Dominant Hand: Right    Extremity/Trunk Assessment   Upper Extremity Assessment Upper Extremity Assessment: Generalized weakness;RUE deficits/detail;LUE deficits/detail RUE Deficits / Details: Gross strength 3/5. Pt has RA  nodules, ulnar drift, and is not able to fully open fingers or hold a cup to drink from. Pt had mild skin tear on R forearm, gauze applied and RN notified. Noted BUE tremors as well RUE Sensation: WNL LUE Deficits / Details: Gross strength 3/5. Pt has RA nodules and decreased ability to open fingers L > R. Pt also demonstrated BUE shaking that was new to pt; warm blankets applied. LUE Sensation: WNL    Lower Extremity Assessment Lower Extremity Assessment: Generalized weakness;RLE deficits/detail;LLE deficits/detail RLE Deficits / Details: Pt demonstrated decreased functional ROM of bilateral ankles and lateral drift of metatarsals consistent with RA. Pt reported inability to stand up secondary to pain in feet. RLE Sensation: WNL LLE Deficits / Details: Pt demonstrated decreased functional ROM of bilateral ankles and lateral drift of metatarsals consistent with RA. Pt reported inability to stand up secondary to pain in feet. LLE Sensation: WNL    Cervical / Trunk Assessment Cervical / Trunk Assessment: Kyphotic  Communication   Communication: No difficulties  Cognition Arousal/Alertness: Awake/alert Behavior During Therapy: Anxious Overall Cognitive Status: No family/caregiver present to determine baseline cognitive functioning                                 General Comments: Decreased safety awareness and awareness of deficits. Kept stating "I need to rest".      General Comments      Exercises     Assessment/Plan    PT Assessment Patient needs continued PT services  PT Problem List Decreased strength;Decreased mobility;Decreased range of motion;Decreased activity tolerance;Decreased balance;Decreased knowledge of use of DME;Pain       PT Treatment Interventions DME instruction;Therapeutic activities;Therapeutic exercise;Patient/family education;Functional mobility training;Wheelchair mobility training;Balance training;Neuromuscular re-education    PT Goals  (Current goals can be found in the Care Plan section)  Acute Rehab PT Goals Patient Stated Goal: I just want to rest and then go home. PT Goal Formulation: With patient Time For Goal Achievement: 11/21/20 Potential to Achieve Goals: Fair    Frequency Min 2X/week   Barriers to discharge Decreased caregiver support      Co-evaluation               AM-PAC PT "6 Clicks" Mobility  Outcome Measure Help needed turning from your back to your side while in a flat bed without using bedrails?: A Lot Help needed moving from lying on your back to sitting on the side of a flat bed without using bedrails?: Total Help needed moving to and from a bed to a chair (including a wheelchair)?: Total Help needed standing up from a chair using your arms (e.g., wheelchair or bedside chair)?: Total Help needed to walk in hospital room?: Total Help needed climbing 3-5 steps with a railing? : Total 6 Click Score: 7    End of Session Equipment Utilized During Treatment: Gait belt Activity Tolerance: Patient limited by pain;Patient limited by fatigue Patient left: in bed;with call bell/phone within reach (on stretcher in ED) Nurse Communication: Mobility status PT Visit Diagnosis: Unsteadiness on feet (R26.81);Muscle weakness (generalized) (  M62.81);History of falling (Z91.81);Difficulty in walking, not elsewhere classified (R26.2)    Time: [id number] PT Time Calculation (min) (ACUTE ONLY): 28 min   Charges:   PT Evaluation $PT Eval Moderate Complexity: 1 Mod PT Treatments $Therapeutic Activity: 8-22 mins        Dawayne Cirri, SPT Dawayne Cirri 11/08/2020, 11:10 AM

## 2020-11-08 NOTE — Progress Notes (Signed)
CSW contacted the following facilities   [facility]- No medicaid Beds   [facility]- No medicaid Beds   [facility]- Left a message   [facility]- Admissions is willing to review. Patients situation was explained    [facility]- Admissions is willing to review. Patients situation was explained   [facility]- Left a message   [facility]- Admissions cell phone is full. Unable to leave VM. Text message was sent.   [facility]. [facility]- Left a message

## 2020-11-08 NOTE — ED Notes (Signed)
Iv irriagtes ok and fluid ran in as ordered

## 2020-11-08 NOTE — Progress Notes (Signed)
Patient spoke with [employer] and agreed to fill out medicaid paperwork to be turned into social services on her behalf. CSW spoke with patient about her inability to care for herself and how it will not be safe for her to return home. Patient agreed to go to a SNF for long term if she qualifies. Patient stated she would like to go to [facility], [facility], [facility], [place]. CSW stated she can't guarantee they have beds and will send out other referrals. CSW updated patients daughter to see if they would be able to cover the difference of cost if patient makes to much money for medicaid. Patients daughter stated no.    Application was filled out and sent.

## 2020-11-08 NOTE — Progress Notes (Signed)
[facility]- 3 year waiting list for Medicaid beds  [facility]- Admissions stated they could possibly take patient if medicaid is approved.

## 2020-11-08 NOTE — ED Notes (Signed)
Son called. Message left.

## 2020-11-08 NOTE — ED Notes (Signed)
pts bp and down fluid bolus almost complete

## 2020-11-08 NOTE — Progress Notes (Signed)
CSW spoke with patients son who stated he will not pick his mother up. CSW explained that patient does not meet qualifications for long term care at a nursing home. Patient has no medicaid/payor source. CSW told the son they do have the option to pay out of pocket but that ranges from 5,000-10,000 a month. Patients son asked CSW what do you expect me to do? CSW stated that the family will need to come together and make a plan for her care at home and apply for medicaid. Patients son stated he is not coming to pick her up and will not be responsible for her care and if she gets hurt when she returns home. Patients son stated her doctor told him to bring her to the ED to get placed. CSW stated that the public cannot drop their family off in the hospital to be placed in a nursing home. CSW stated they will need to apply for medicaid and go through their PCP. Patients son stated he left a message with her doctor. Patients son stated he is going to let CSW and his mothers doctor work this out. CSW told patients son that she is not responsible for his mothers care. CSW stated he could hire someone to come into the home for a couple hours a day to care for his mother while they apply for medicaid. Patients son stated he will not be applying for medicaid and his sister who lives in [place], [place] probably will. Patients son stated he is more worried about the safety of his mother and does not care about the hospital policies. CSW stated she understands about the safety and during these times family will have to come together to care for patient. Patients son stated there is no family and its just him and he will not be responsible for that. CSW let patients son that his mother cannot reside in the ED. Patients son stated he understands and CSW stated they will have to transport her home.

## 2020-11-08 NOTE — ED Notes (Addendum)
Updated. Denies questions or needs. Mentions takes oxycodone for pain. C/o pain, "tylenol not working". Pharm tech notified.

## 2020-11-08 NOTE — ED Notes (Signed)
SW at BS

## 2020-11-08 NOTE — Progress Notes (Signed)
CSW spoke with patients daughter who stated she is in [place] on business but lives in [place]. Patients daughter stated that they have tried together mother into long-term care for awhile but she will not stay there. Patients daughter stated that she rather go home and she has burned her bridges with the family. Patients daughter stated they tried to apply for medicaid for her in the past and believes she makes to much money. Patients daughter stated there is nothing more they can do and her brother in not in good physical shape to care for patient. CSW stated she would see if her mother will talk to [employer] to get the process started for medicaid. Family refused to apply for patient. CSW stated if she gets denied then she will have to return home. Patients daughter stated she does not know what else to tell CSW. CSW stated she would keep her updated.

## 2020-11-09 ENCOUNTER — Encounter (HOSPITAL_COMMUNITY): Payer: Self-pay | Admitting: Internal Medicine

## 2020-11-09 ENCOUNTER — Other Ambulatory Visit: Payer: Self-pay | Admitting: *Deleted

## 2020-11-09 ENCOUNTER — Emergency Department (HOSPITAL_COMMUNITY): Payer: Medicare Other

## 2020-11-09 DIAGNOSIS — J90 Pleural effusion, not elsewhere classified: Secondary | ICD-10-CM | POA: Diagnosis not present

## 2020-11-09 DIAGNOSIS — G9340 Encephalopathy, unspecified: Secondary | ICD-10-CM | POA: Diagnosis present

## 2020-11-09 DIAGNOSIS — K625 Hemorrhage of anus and rectum: Secondary | ICD-10-CM | POA: Diagnosis not present

## 2020-11-09 DIAGNOSIS — R509 Fever, unspecified: Secondary | ICD-10-CM | POA: Diagnosis not present

## 2020-11-09 DIAGNOSIS — R4182 Altered mental status, unspecified: Secondary | ICD-10-CM | POA: Diagnosis not present

## 2020-11-09 LAB — CBC
HCT: 19.3 % — ABNORMAL LOW (ref 36.0–46.0)
Hemoglobin: 5.3 g/dL — CL (ref 12.0–15.0)
MCH: 21.8 pg — ABNORMAL LOW (ref 26.0–34.0)
MCHC: 27.5 g/dL — ABNORMAL LOW (ref 30.0–36.0)
MCV: 79.4 fL — ABNORMAL LOW (ref 80.0–100.0)
Platelets: 296 10*3/uL (ref 150–400)
RBC: 2.43 MIL/uL — ABNORMAL LOW (ref 3.87–5.11)
RDW: 19 % — ABNORMAL HIGH (ref 11.5–15.5)
WBC: 9.9 10*3/uL (ref 4.0–10.5)
nRBC: 0 % (ref 0.0–0.2)

## 2020-11-09 LAB — URINALYSIS, ROUTINE W REFLEX MICROSCOPIC
Bilirubin Urine: NEGATIVE
Glucose, UA: NEGATIVE mg/dL
Hgb urine dipstick: NEGATIVE
Ketones, ur: NEGATIVE mg/dL
Leukocytes,Ua: NEGATIVE
Nitrite: NEGATIVE
Protein, ur: NEGATIVE mg/dL
Specific Gravity, Urine: 1.02 (ref 1.005–1.030)
pH: 5 (ref 5.0–8.0)

## 2020-11-09 LAB — PREPARE RBC (CROSSMATCH)

## 2020-11-09 LAB — CBG MONITORING, ED
Glucose-Capillary: 184 mg/dL — ABNORMAL HIGH (ref 70–99)
Glucose-Capillary: 140 mg/dL — ABNORMAL HIGH (ref 70–99)
Glucose-Capillary: 128 mg/dL — ABNORMAL HIGH (ref 70–99)
Glucose-Capillary: 122 mg/dL — ABNORMAL HIGH (ref 70–99)

## 2020-11-09 LAB — HEMOGLOBIN AND HEMATOCRIT, BLOOD
HCT: 27.1 % — ABNORMAL LOW (ref 36.0–46.0)
Hemoglobin: 8 g/dL — ABNORMAL LOW (ref 12.0–15.0)

## 2020-11-09 LAB — LACTIC ACID, PLASMA: Lactic Acid, Venous: 1.3 mmol/L (ref 0.5–1.9)

## 2020-11-09 LAB — POC SARS CORONAVIRUS 2 AG -  ED: SARSCOV2ONAVIRUS 2 AG: NEGATIVE

## 2020-11-09 MED ORDER — SODIUM CHLORIDE 0.9 % IV SOLN: 10.0000 mL/h | Freq: Once | INTRAVENOUS | Status: DC

## 2020-11-09 MED ORDER — ARFORMOTEROL TARTRATE 15 MCG/2ML IN NEBU: 15.0000 ug | INHALATION_SOLUTION | Freq: Two times a day (BID) | RESPIRATORY_TRACT | Status: DC | PRN

## 2020-11-09 MED ORDER — LACTATED RINGERS IV BOLUS
1000.0000 mL | Freq: Once | INTRAVENOUS | Status: AC
Start: 2020-11-09 — End: 2020-11-09
  Administered 2020-11-09: 1000 mL via INTRAVENOUS

## 2020-11-09 MED ORDER — PANTOPRAZOLE SODIUM 40 MG IV SOLR
40.0000 mg | Freq: Two times a day (BID) | INTRAVENOUS | Status: DC
  Administered 2020-11-09 – 2020-11-12 (×6): 40 mg via INTRAVENOUS
  Filled 2020-11-09 (×6): qty 40

## 2020-11-09 MED ORDER — ENSURE ENLIVE PO LIQD
237.0000 mL | Freq: Two times a day (BID) | ORAL | Status: DC
  Administered 2020-11-09: 237 mL via ORAL
  Filled 2020-11-09 (×2): qty 237

## 2020-11-09 MED ORDER — IPRATROPIUM-ALBUTEROL 0.5-2.5 (3) MG/3ML IN SOLN: 3.0000 mL | Freq: Four times a day (QID) | RESPIRATORY_TRACT | Status: DC | PRN

## 2020-11-09 MED ORDER — BUDESONIDE 0.25 MG/2ML IN SUSP
0.2500 mg | Freq: Two times a day (BID) | RESPIRATORY_TRACT | Status: DC
  Administered 2020-11-09 – 2020-11-15 (×8): 0.25 mg via RESPIRATORY_TRACT
  Filled 2020-11-09 (×14): qty 2

## 2020-11-09 MED ORDER — SODIUM CHLORIDE 0.9 % IV BOLUS
500.0000 mL | Freq: Once | INTRAVENOUS | Status: AC
  Administered 2020-11-09: 500 mL via INTRAVENOUS

## 2020-11-09 MED ORDER — HYDROCORTISONE NA SUCCINATE PF 100 MG IJ SOLR
50.0000 mg | Freq: Two times a day (BID) | INTRAMUSCULAR | Status: DC
  Administered 2020-11-09 – 2020-11-11 (×4): 50 mg via INTRAVENOUS
  Filled 2020-11-09 (×4): qty 2

## 2020-11-09 MED ORDER — ACETAMINOPHEN 325 MG PO TABS
650.0000 mg | ORAL_TABLET | ORAL | Status: DC | PRN
Start: 2020-11-09 — End: 2020-11-09

## 2020-11-09 MED ORDER — SODIUM CHLORIDE 0.9 % IV BOLUS
1000.0000 mL | Freq: Once | INTRAVENOUS | Status: AC
  Administered 2020-11-09: 1000 mL via INTRAVENOUS

## 2020-11-09 MED ORDER — ACETAMINOPHEN 325 MG PO TABS
650.0000 mg | ORAL_TABLET | Freq: Four times a day (QID) | ORAL | Status: DC | PRN
  Administered 2020-11-10 – 2020-11-14 (×2): 650 mg via ORAL
  Filled 2020-11-09 (×2): qty 2

## 2020-11-09 MED ORDER — ACETAMINOPHEN 650 MG RE SUPP: 650.0000 mg | Freq: Four times a day (QID) | RECTAL | Status: DC | PRN

## 2020-11-09 NOTE — ED Notes (Signed)
I messaged Dr. Hal Hope that pt's repeat hgb 5.3

## 2020-11-09 NOTE — Progress Notes (Signed)
Patient was made aware of the bed offer at [facility]. Patient agreed to go. [facility] stated they can take patient tomorrow and told CSW to call in the morning.

## 2020-11-09 NOTE — ED Notes (Signed)
I got pt to eat half a fruit cup and half a cup of diet coke. Pt refusing to eat more. Pt tired. I'm still able to have a full conversation with her, but she sleeps a lot. Pt's faced cleaned up with wet wash cloth.

## 2020-11-09 NOTE — ED Notes (Signed)
Blood bank called and they said they will need some extra time to get her 2U ready because her antibody screen came up positive. They said they will call when they're available.

## 2020-11-09 NOTE — ED Provider Notes (Signed)
I was asked by the transition of care team to evaluate the patient as they found her to be confused compared to when they had seen her previously.  Patient tells me that she is home.  She denies any complaints.  Feels mildly warm to the touch.  Clear lung sounds no abdominal tenderness mild tachycardia.  CT of the head, UA was ordered by another ED provider.  Will recheck blood sugar.  Rectal temperature.    Deno Etienne, DO 11/09/20 1630

## 2020-11-09 NOTE — ED Notes (Signed)
The pt sats are staying up at present on the nasal 02

## 2020-11-09 NOTE — ED Notes (Signed)
Dr. Hal Hope aware of pt's hgb. Is going to call daughter to get consent signed for blood transfusion and order blood.

## 2020-11-09 NOTE — H&P (Signed)
History and Physical    [name] [id number] DOB: 08/09/1940 DOA: 11/07/2020  PCP: [name], MD  Patient coming from: Home.  Chief Complaint: Fall.  HPI: [name] is a 80 y.o. 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  Patient did hit her head.  She was laying on the floor for 5 to 6 hours until patient's son came to check on and was brought to the ER.   ED Course: Patient had CT head which did not show anything acute.  And patient was cleared for discharge back to home when patient was waiting to be discharged back home patient became confused and hypotensive and started having rectal bleeding.  Patient also had a temperature of 100.4 F.  Chest x-ray UA was unremarkable.  Patient's blood pressure was improving with fluids.  Repeat CBC showed hemoglobin dropped from 8-5.3.  Patient was ordered 2 units of PRBC transfusion fluid bolus admitted for acute GI bleed.  COVID test is pending.  Review of Systems: As per HPI, rest all negative.   Past Medical History:  Diagnosis Date   Acute pulmonary embolism ([place])    b/l; dx'ed May '22   Asthma    Complication of anesthesia    Anesthesia told her years ago she got too cold during surgery   Diabetes mellitus without complication ([place])    Emphysema    Fibromyalgia    Osteoporosis    Rheumatoid arthritis(714.0)    Sleep apnea    no cpap    Past Surgical History:  Procedure Laterality Date   ABDOMINAL HYSTERECTOMY     ANKLE FRACTURE SURGERY  2007   BREAST SURGERY     mammoplasty and then removed   COLONOSCOPY  WITH PROPOFOL N/A 08/22/2020   Procedure: COLONOSCOPY WITH PROPOFOL;  Surgeon: Ronnette Juniper, MD;  Location: [facility];  Service: Gastroenterology;  Laterality: N/A;  If schedule availability permits, would like to add on upper endoscopy as well   ESOPHAGOGASTRODUODENOSCOPY (EGD) WITH PROPOFOL N/A 08/22/2020   Procedure: ESOPHAGOGASTRODUODENOSCOPY (EGD) WITH PROPOFOL;  Surgeon: Ronnette Juniper, MD;  Location: [facility];  Service: Gastroenterology;  Laterality: N/A;   FOOT SURGERY     numerous   GIVENS CAPSULE STUDY N/A 10/28/2020   Procedure: GIVENS CAPSULE STUDY;  Surgeon: Otis Brace, MD;  Location: [facility];  Service: Gastroenterology;  Laterality: N/A;   HOT HEMOSTASIS N/A 08/22/2020   Procedure: HOT HEMOSTASIS (ARGON PLASMA COAGULATION/BICAP);  Surgeon: Ronnette Juniper, MD;  Location: [facility];  Service: Gastroenterology;  Laterality: N/A;   I & D EXTREMITY Right 10/26/2014   Procedure: IRRIGATION AND DEBRIDEMENT RIGHT KNEE ;  Surgeon: Gaynelle Arabian, MD;  Location: [facility];  Service: Orthopedics;  Laterality: Right;   IRRIGATION AND DEBRIDEMENT KNEE Right 03/09/2017   Procedure: IRRIGATION AND DEBRIDEMENT RIGHT KNEE PRE-PATELLAR BURSA;  Surgeon: Susa Day, MD;  Location: [facility];  Service: Orthopedics;  Laterality: Right;   KNEE BURSECTOMY Right 09/16/2014   Procedure: EXCISON OF PRE PATELLA BURSA RIGHT KNEE ;  Surgeon: Gaynelle Arabian, MD;  Location: [facility];  Service: Orthopedics;  Laterality: Right;  PARTIAL HYSTERECTOMY  1970   POLYPECTOMY  08/22/2020   Procedure: POLYPECTOMY;  Surgeon: Ronnette Juniper, MD;  [facility]: [facility];  Service: Gastroenterology;;   TOTAL KNEE ARTHROPLASTY  2005 / 2006   x2     reports that she quit smoking about 26 years ago. Her smoking use included cigarettes. She has never used smokeless tobacco. She reports that she does not drink alcohol and does not use drugs.  Allergies  Allergen Reactions   Penicillins Hives    Has patient had a PCN reaction  causing immediate rash, facial/tongue/throat swelling, SOB or lightheadedness with hypotension: Yes Has patient had a PCN reaction causing severe rash involving mucus membranes or skin necrosis: No Has patient had a PCN reaction that required hospitalization: No Has patient had a PCN reaction occurring within the last 10 years: No If all of the above answers are "NO", then may proceed with Cephalosporin use. Patient has tolerated ceftriaxone in the recent past.   Sulfamethoxazole-Trimethoprim Nausea And Vomiting and Other (See Comments)    Stomach problems   Adalimumab Other (See Comments)    "Not effective"   Hydroxychloroquine Sulfate Other (See Comments)    "Plaquenil did not work after [employer]"   Meperidine Other (See Comments)    Cramping   Meperidine Hcl Other (See Comments)    Cramping    Methotrexate Nausea And Vomiting and Other (See Comments)    "Projectile vomiting"   Penicillin G Sodium Hives   Phentermine-Topiramate Nausea Only and Other (See Comments)    "Nausea and dizziness & on re-trial, mental side effects so really cannot take it   Sulfa Antibiotics Nausea And Vomiting and Other (See Comments)    Stomach problems/GI upset, also   Sulfonamide Derivatives Nausea And Vomiting and Other (See Comments)    "Stomach problems"    Family History  Problem Relation Age of Onset   Hypertension Other    Diabetes Other    Stroke Other    Diabetes Mother    Diabetes Father    Diabetes Sister    Diabetes Maternal Aunt    Cancer Paternal Uncle    Cancer Paternal Grandmother    Breast cancer Paternal Grandmother     Prior to Admission medications   Medication Sig Start Date End Date Taking? Authorizing Provider  acetaminophen (TYLENOL) 500 MG tablet Take 1,000 mg by mouth every 6 (six) hours as needed for mild pain (or headaches).   Yes [provider]  albuterol (VENTOLIN HFA) 108 (90 Base) MCG/ACT inhaler Inhale 2 puffs into the lungs every 6 (six) hours as  needed for shortness of breath or wheezing. 07/05/19  Yes [provider]  arformoterol (BROVANA) 15 MCG/2ML NEBU Take 15 mcg by nebulization 2 (two) times daily as needed (for shortness of breath).   Yes [provider]  atorvastatin (LIPITOR) 20 MG tablet Take 20 mg by mouth daily. 10/23/20  Yes [provider]  budesonide (PULMICORT) 0.25 MG/2ML nebulizer solution Take 2 mLs (0.25 mg total) by nebulization 2 (two) times daily. 05/26/20 10/27/21 Yes [name], MD  Cyanocobalamin (B-12) 2500 MCG TABS Take 2,500 mcg by mouth at bedtime.    Yes [provider]  DULoxetine (CYMBALTA) 60 MG capsule Take 60 mg by mouth at bedtime.    Yes [provider]  ezetimibe (ZETIA) 10 MG tablet Take 10 mg by mouth at bedtime.  11/04/16  Yes [provider]  ferrous gluconate (FERGON) 324 MG tablet Take 324 mg by mouth every other  day.   Yes [provider]  ipratropium-albuterol (DUONEB) 0.5-2.5 (3) MG/3ML SOLN Take 3 mLs by nebulization every 6 (six) hours as needed (wheezing). 07/19/20  Yes [provider]  metFORMIN (GLUCOPHAGE) 500 MG tablet Take 500 mg by mouth 2 (two) times daily. 10/25/20  Yes [provider]  NOVOLIN 70/30 KWIKPEN (70-30) 100 UNIT/ML KwikPen Inject 10 Units into the skin 2 (two) times daily. 10/20/20  Yes [provider]  olopatadine (PATANOL) 0.1 % ophthalmic solution Place 1 drop into both eyes 2 (two) times daily. Patient taking differently: Place 1 drop into both eyes 2 (two) times daily as needed for allergies. 10/30/20  Yes Samuella Cota, MD  ondansetron (ZOFRAN-ODT) 8 MG disintegrating tablet Take 8 mg by mouth every 8 (eight) hours as needed for nausea or vomiting (dissolve orally). 10/24/20  Yes [provider]  pantoprazole (PROTONIX) 40 MG tablet Take one tablet once every morning Patient taking differently: Take 40 mg by mouth daily before breakfast. 05/26/20  Yes Kc, Ramesh, MD   tolterodine (DETROL LA) 2 MG 24 hr capsule Take 2 mg by mouth every evening. 08/15/20  Yes [provider]  B-D UF III MINI PEN NEEDLES 31G X 5 MM MISC SMARTSIG:1 Each SUB-Q 4 Times Daily 10/22/19   [provider]  predniSONE (DELTASONE) 5 MG tablet Take 5 mg by mouth at bedtime.    [provider]  MYRBETRIQ 50 MG TB24 tablet Take 50 mg by mouth at bedtime. Patient not taking: Reported on 08/19/2020 03/29/20 08/19/20  [provider]    Physical Exam: Constitutional: Moderately built and nourished. Vitals:   11/09/20 1800 11/09/20 1830 11/09/20 1835 11/09/20 1915  BP: 93/60 (!) 98/50  (!) 100/57  Pulse: (!) 101 98  99  Resp: (!) 25 (!) 30  (!) 28  Temp:   (!) 97.4 F (36.3 C)   TempSrc:   Oral   SpO2: 100% 97%  98%  Weight:      Height:       Eyes: Anicteric no pallor. ENMT: No discharge from the ears eyes nose and mouth. Neck: No mass felt.  No neck rigidity. Respiratory: No rhonchi or crepitations. Cardiovascular: S1-S2 heard. Abdomen: Soft nontender bowel sound present. Musculoskeletal: No edema. Skin: No rash. Neurologic: Alert awake oriented to her name.  Moving all extremities. Psychiatric: Appears confused.   Labs on Admission: I have personally reviewed following labs and imaging studies  CBC: Recent Labs  Lab 11/07/20 1829 11/08/20 1934 11/09/20 1646  WBC 11.2* 11.1*  --   NEUTROABS 9.1*  --   --   HGB 9.2* 8.4* 8.0*  HCT 31.1* 30.9* 27.1*  MCV 75.9* 82.0  --   PLT 369 364  --    Basic Metabolic Panel: Recent Labs  Lab 11/07/20 1829  NA 134*  K 3.7  CL 102  CO2 22  GLUCOSE 137*  BUN 12  CREATININE 0.75  CALCIUM 8.8*   GFR: Estimated Creatinine Clearance: 51.3 mL/min (by C-G formula based on SCr of 0.75 mg/dL). Liver Function Tests: No results for input(s): AST, ALT, ALKPHOS, BILITOT, PROT, ALBUMIN in the last 168 hours. No results for input(s): LIPASE, AMYLASE in the last 168 hours. No results for input(s):  AMMONIA in the last 168 hours. Coagulation Profile: No results for input(s): INR, PROTIME in the last 168 hours. Cardiac Enzymes: Recent Labs  Lab 11/07/20 1829  CKTOTAL 33*   BNP (last 3 results) No results for input(s): PROBNP in the  last 8760 hours. HbA1C: Recent Labs    11/07/20 1829  HGBA1C 6.3*   CBG: Recent Labs  Lab 11/08/20 2256 11/09/20 0909 11/09/20 1314 11/09/20 1644 11/09/20 2145  GLUCAP 158* 184* 140* 128* 122*   Lipid Profile: No results for input(s): CHOL, HDL, LDLCALC, TRIG, CHOLHDL, LDLDIRECT in the last 72 hours. Thyroid Function Tests: No results for input(s): TSH, T4TOTAL, FREET4, T3FREE, THYROIDAB in the last 72 hours. Anemia Panel: No results for input(s): VITAMINB12, FOLATE, FERRITIN, TIBC, IRON, RETICCTPCT in the last 72 hours. Urine analysis:    Component Value Date/Time   COLORURINE AMBER (A) 11/09/2020 1834   APPEARANCEUR CLEAR 11/09/2020 1834   LABSPEC 1.020 11/09/2020 1834   PHURINE 5.0 11/09/2020 1834   GLUCOSEU NEGATIVE 11/09/2020 1834   GLUCOSEU NEGATIVE 12/24/2016 1239   HGBUR NEGATIVE 11/09/2020 1834   BILIRUBINUR NEGATIVE 11/09/2020 1834   KETONESUR NEGATIVE 11/09/2020 1834   PROTEINUR NEGATIVE 11/09/2020 1834   UROBILINOGEN 0.2 12/24/2016 1239   NITRITE NEGATIVE 11/09/2020 1834   LEUKOCYTESUR NEGATIVE 11/09/2020 1834   Sepsis Labs: '@LABRCNTIP'$ (procalcitonin:4,lacticidven:4) )No results found for this or any previous visit (from the past 240 hour(s)).   Radiological Exams on Admission: CT HEAD WO CONTRAST (5MM)  Result Date: 11/09/2020 CLINICAL DATA:  Mental status change.  Unknown cause EXAM: CT HEAD WITHOUT CONTRAST TECHNIQUE: Contiguous axial images were obtained from the base of the skull through the vertex without intravenous contrast. COMPARISON:  CT head 11/07/2020 BRAIN: BRAIN Cerebral ventricle sizes are concordant with the degree of cerebral volume loss. Patchy and confluent areas of decreased attenuation are noted  throughout the deep and periventricular white matter of the cerebral hemispheres bilaterally, compatible with chronic microvascular ischemic disease. No evidence of large-territorial acute infarction. No parenchymal hemorrhage. No mass lesion. No extra-axial collection. No mass effect or midline shift. No hydrocephalus. Basilar cisterns are patent. Vascular: No hyperdense vessel. Atherosclerotic calcifications are present within the cavernous internal carotid and vertebral arteries. Skull: No acute fracture or focal lesion. Sinuses/Orbits: Paranasal sinuses and mastoid air cells are clear. Bilateral lens replacement. Otherwise the orbits are unremarkable. Other: None. IMPRESSION: No acute intracranial abnormality. Electronically Signed   By: Iven Finn M.D.   On: 11/09/2020 20:21   DG Chest Port 1 View  Result Date: 11/09/2020 CLINICAL DATA:  Fever EXAM: PORTABLE CHEST 1 VIEW COMPARISON:  11/07/2020 FINDINGS: Stable cardiomediastinal contours. Small left pleural effusion. No focal airspace consolidation. No pneumothorax. No acute bony findings. Degenerative changes of the bilateral shoulders. IMPRESSION: Small left pleural effusion. Electronically Signed   By: Davina Poke D.O.   On: 11/09/2020 18:27    EKG: Independently reviewed.  Sinus tachycardia.  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  In June 2022 patient had underwent EGD and colonoscopy.  EGD showed nonbleeding duodenal AVMs which were treated with cautery.  Colonoscopy showed colonic polyps and  diverticulosis.  Gastroenterology was planning repeat EGD if there is drop in hemoglobin.  I did discuss with Dr. Rosalie Gums gastroenterologist who knows the patient well.  Plan is to transfuse  at this time keep on Protonix and they will be seeing patient in consult. Acute blood loss anemia follow CBC after transfusion.  2 units PRBC has been ordered. Hypotension likely from blood loss.  I also ordered stress dose of steroids since patient is on long-term steroids.  Patient blood pressure is responding to fluids. Fever with encephalopathy cause not clear.  Blood cultures have been sent.  COVID test is pending.  UA and chest x-ray unremarkable.  We will closely observe.  I have not started any antibiotics for now. History of rheumatoid arthritis on prednisone presently on IV hydrocortisone at stress dose. Diabetes mellitus type 2 we will keep patient on sliding scale coverage. Prior history of pulm embolism presently off anticoagulation. History of pancreatic cyst. Recent fall CT head is unremarkable.  COVID test is pending.   Since patient is hypotensive with acute blood loss anemia will need close monitoring and further management and probably repeat EGD will need inpatient status.   DVT prophylaxis: SCDs.  Avoiding anticoagulation in the setting of GI bleed. Code Status: Full code. Family Communication: I was not able to reach patient's daughter or son.  But ER physician was able to reach and did take consent for blood transfusion. Disposition Plan: Home when stable. Consults called: Copywriter, advertising. Admission status: Inpatient.   Rise Patience MD [facility] Pager [phone].  If 7PM-7AM, please contact night-coverage [email or web address] Password [facility]  11/09/2020, 9:47 PM

## 2020-11-09 NOTE — ED Notes (Signed)
She has blood in her brief. It's bright red and looks like it's from the rectum. It isn't much, but she says she has hemorrhoids. I didn't look when I got the rectal T.  Message sent to Dr. Gilford Raid. Pt given a pepsi.

## 2020-11-09 NOTE — ED Provider Notes (Addendum)
Pt's daughter called and said pt seemed confused.  This is abnormal for patient.  Pt was admitted from 8/19-8/22 for acute blood loss anemia and came to the ED on 8/30 for a fall.  Trauma eval neg.  Hgb on the 30th was 9.2.  Pt did receive 1 unit prbc while admitted.  She has had a recent egd and colonoscopy showing gastric polyps, nonbleeding duodenal AVM treated with cautery.  Colonic polyps, diverticulosis. 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  Pt was on Eliquis, but that has stopped.  Pt has had some brbpr intermittently with + hemorrhoids.  Hgb has dropped down to 8.  Pt also had a low grade temp.  Covid neg.  CXR neg.  UA neg.  Pt has not wanted to eat or drink anything much and bp is soft.  Pt has been waiting in the ED for a bed at a SNF, but I think she has too much going on to be d/c.  Due to all this, pt was d/w Dr. Hal Hope who will admit for obs.  I spoke with pt's daughter who requests SNF at d/c.   Isla Pence, MD 11/09/20 2045  Pt's hgb continuing to trend downwards (hgb 5.3)  2 units prbcs ordered.  I did talk with pt's daughter earlier today and she had no problem with transfusion.  Pt also ok with getting blood.  CRITICAL CARE Performed by: Isla Pence   Total critical care time: 30 minutes  Critical care time was exclusive of separately billable procedures and treating other patients.  Critical care was necessary to treat or prevent imminent or life-threatening deterioration.  Critical care was time spent personally by me on the following activities: development of treatment plan with patient and/or surrogate as well as nursing, discussions with consultants, evaluation of patient's response to treatment, examination of patient, obtaining history from patient or surrogate, ordering and performing treatments and interventions, ordering and  review of laboratory studies, ordering and review of radiographic studies, pulse oximetry and re-evaluation of patient's condition.    Isla Pence, MD 11/09/20 [phone]

## 2020-11-09 NOTE — ED Notes (Signed)
Got patient up to the bedside toilet patient is now off the toilet back in bed on the monitor with call bell in reach

## 2020-11-09 NOTE — ED Notes (Addendum)
Pt took a couple bites of fruit and refused rest of meal. Notified Trifan MD and asked about possible order for ensure to help with nutrition if appropriate.

## 2020-11-09 NOTE — ED Notes (Signed)
Pt vomiyed a few minutes ago med given  Having a bm

## 2020-11-09 NOTE — ED Notes (Signed)
The pt falls asleep and her sats drop to 88-90% on room air  I placed nasal 02 at 2 liters on the pt she reports that she is tired of [place] not letting her sleep  Pure wick draining well

## 2020-11-09 NOTE — Progress Notes (Signed)
CSW left a message with [facility] who also accepted patient.

## 2020-11-09 NOTE — Progress Notes (Signed)
CSW contacted [facility] who stated they would take patient if she was fully vaccinated. CSW is waiting to here back from the facility due to patient only having 1 covid shot.

## 2020-11-09 NOTE — ED Notes (Signed)
Patient transported to CT 

## 2020-11-09 NOTE — ED Notes (Signed)
Checked patient cbg it was  

## 2020-11-09 NOTE — ED Notes (Signed)
Dr. Gilford Raid made aware of pt's rectal temperature. Also given EKG. Pt was changed, bed was changed, pt was repositioned in bed. Placed a new purewick in pt and changed out tubing/suction cannister. Dr. Gilford Raid aware we're waiting on fresh urine that way. Dr. Gilford Raid spoke with pt's daughter on the phone and updated her.

## 2020-11-09 NOTE — Progress Notes (Signed)
CSW spoke with [facility] who accepted patient but are not currently taking any new admits due to 3 nurses quitting. CSW was told by admissions they were told they should be able to take patient Tuesday 11/14/20

## 2020-11-09 NOTE — ED Notes (Signed)
ED physician at bedside.

## 2020-11-09 NOTE — ED Notes (Signed)
Pt continues to sleep. NAD noted. 

## 2020-11-09 NOTE — Patient Outreach (Signed)
[facility]) Care Management  11/09/2020  [name] 1940-03-16 [id number]   Telephone Assessment-Unsuccessful  RN attempted outreach however unsuccessful. Only able to leave a HIPAA approved voice message requesting a call back. Called with messages left to both the home and mobile contacts noted in [place].   Will attempt another outreach call over the next month.  [name], RN Care Management Coordinator [place] Office [phone]

## 2020-11-09 NOTE — ED Notes (Signed)
Admitting physician at bedside

## 2020-11-09 NOTE — ED Notes (Signed)
Pt resting in bed comfortably. Pt felt wet so I checked brief and it's dry with no bright red blood in it.

## 2020-11-09 NOTE — ED Notes (Signed)
I checked pt's brief and it's still dry.

## 2020-11-09 NOTE — ED Notes (Signed)
Pt repositioned in bed.

## 2020-11-09 NOTE — ED Notes (Signed)
Attempted to call report but they didn't answer.

## 2020-11-09 NOTE — ED Notes (Signed)
Got patient cleaned up placed a new external cath and placed a new brief patient is resting with call call bell in reach

## 2020-11-09 NOTE — ED Notes (Signed)
I took pt up to floor via hospital bed via monitor.

## 2020-11-09 NOTE — ED Notes (Signed)
Per pt's daughter pt is normally A&Ox4 and not confused. Pt has been confused during my care. Notified Trifan MD who is passing notification along to [facility] regarding assessing pt d/t pt being altered from baseline per family.

## 2020-11-09 NOTE — ED Notes (Signed)
Put patient on the bedpan patient has call bell in reach  

## 2020-11-09 NOTE — ED Notes (Signed)
Pt appears to be asleep. NAD noted. 

## 2020-11-09 NOTE — ED Notes (Signed)
Got patient off the bedpan patient is resting with call bell in reach 

## 2020-11-09 NOTE — ED Notes (Signed)
I introduced myself to pt. Pt AxO x4. Oriented to place, situation and person. Disoriented to year. Pt pulled up in bed. C/O chronic pain due to OA. Pt denies further needs.

## 2020-11-09 NOTE — ED Notes (Signed)
Checked patient cbg it was 189 notified the RN of blood sugar patient is resting with call bell in reach

## 2020-11-09 NOTE — Progress Notes (Signed)
CSW left a message with [facility] admissions. CSW never heard back from them yesterday.

## 2020-11-09 NOTE — ED Notes (Signed)
Diaper changed, pt pulled up in bed.

## 2020-11-09 NOTE — ED Notes (Signed)
Checked patient cbg it was 140 notified RN of blood sugar

## 2020-11-09 NOTE — ED Notes (Signed)
X RAY at bedside 

## 2020-11-09 NOTE — ED Notes (Signed)
Informed primary nurse of critical HgB

## 2020-11-09 NOTE — ED Notes (Signed)
Pt refusing to eat breakfast at this time.

## 2020-11-09 NOTE — Progress Notes (Signed)
Patients daughter and son was updated

## 2020-11-10 DIAGNOSIS — Z882 Allergy status to sulfonamides status: Secondary | ICD-10-CM | POA: Diagnosis not present

## 2020-11-10 DIAGNOSIS — G4730 Sleep apnea, unspecified: Secondary | ICD-10-CM | POA: Diagnosis not present

## 2020-11-10 DIAGNOSIS — I959 Hypotension, unspecified: Secondary | ICD-10-CM | POA: Diagnosis present

## 2020-11-10 DIAGNOSIS — K3189 Other diseases of stomach and duodenum: Secondary | ICD-10-CM | POA: Diagnosis not present

## 2020-11-10 DIAGNOSIS — K219 Gastro-esophageal reflux disease without esophagitis: Secondary | ICD-10-CM | POA: Diagnosis not present

## 2020-11-10 DIAGNOSIS — Z880 Allergy status to penicillin: Secondary | ICD-10-CM | POA: Diagnosis not present

## 2020-11-10 DIAGNOSIS — D638 Anemia in other chronic diseases classified elsewhere: Secondary | ICD-10-CM | POA: Diagnosis present

## 2020-11-10 DIAGNOSIS — R279 Unspecified lack of coordination: Secondary | ICD-10-CM | POA: Diagnosis not present

## 2020-11-10 DIAGNOSIS — Z7401 Bed confinement status: Secondary | ICD-10-CM | POA: Diagnosis not present

## 2020-11-10 DIAGNOSIS — K922 Gastrointestinal hemorrhage, unspecified: Secondary | ICD-10-CM | POA: Diagnosis not present

## 2020-11-10 DIAGNOSIS — D509 Iron deficiency anemia, unspecified: Secondary | ICD-10-CM | POA: Diagnosis not present

## 2020-11-10 DIAGNOSIS — K31811 Angiodysplasia of stomach and duodenum with bleeding: Secondary | ICD-10-CM | POA: Diagnosis present

## 2020-11-10 DIAGNOSIS — R2681 Unsteadiness on feet: Secondary | ICD-10-CM | POA: Diagnosis not present

## 2020-11-10 DIAGNOSIS — H9190 Unspecified hearing loss, unspecified ear: Secondary | ICD-10-CM | POA: Diagnosis present

## 2020-11-10 DIAGNOSIS — F419 Anxiety disorder, unspecified: Secondary | ICD-10-CM | POA: Diagnosis present

## 2020-11-10 DIAGNOSIS — Z20822 Contact with and (suspected) exposure to covid-19: Secondary | ICD-10-CM | POA: Diagnosis present

## 2020-11-10 DIAGNOSIS — G9340 Encephalopathy, unspecified: Secondary | ICD-10-CM | POA: Diagnosis present

## 2020-11-10 DIAGNOSIS — J449 Chronic obstructive pulmonary disease, unspecified: Secondary | ICD-10-CM | POA: Diagnosis present

## 2020-11-10 DIAGNOSIS — E7849 Other hyperlipidemia: Secondary | ICD-10-CM | POA: Diagnosis not present

## 2020-11-10 DIAGNOSIS — Z7901 Long term (current) use of anticoagulants: Secondary | ICD-10-CM | POA: Diagnosis not present

## 2020-11-10 DIAGNOSIS — R531 Weakness: Secondary | ICD-10-CM | POA: Diagnosis not present

## 2020-11-10 DIAGNOSIS — Z90710 Acquired absence of both cervix and uterus: Secondary | ICD-10-CM | POA: Diagnosis not present

## 2020-11-10 DIAGNOSIS — K862 Cyst of pancreas: Secondary | ICD-10-CM | POA: Diagnosis present

## 2020-11-10 DIAGNOSIS — M069 Rheumatoid arthritis, unspecified: Secondary | ICD-10-CM | POA: Diagnosis present

## 2020-11-10 DIAGNOSIS — E1122 Type 2 diabetes mellitus with diabetic chronic kidney disease: Secondary | ICD-10-CM | POA: Diagnosis present

## 2020-11-10 DIAGNOSIS — M797 Fibromyalgia: Secondary | ICD-10-CM | POA: Diagnosis present

## 2020-11-10 DIAGNOSIS — M6281 Muscle weakness (generalized): Secondary | ICD-10-CM | POA: Diagnosis not present

## 2020-11-10 DIAGNOSIS — D62 Acute posthemorrhagic anemia: Secondary | ICD-10-CM | POA: Diagnosis present

## 2020-11-10 DIAGNOSIS — N183 Chronic kidney disease, stage 3 unspecified: Secondary | ICD-10-CM | POA: Diagnosis present

## 2020-11-10 DIAGNOSIS — E785 Hyperlipidemia, unspecified: Secondary | ICD-10-CM | POA: Diagnosis present

## 2020-11-10 DIAGNOSIS — H5789 Other specified disorders of eye and adnexa: Secondary | ICD-10-CM | POA: Diagnosis present

## 2020-11-10 DIAGNOSIS — R58 Hemorrhage, not elsewhere classified: Secondary | ICD-10-CM | POA: Diagnosis not present

## 2020-11-10 DIAGNOSIS — W06XXXA Fall from bed, initial encounter: Secondary | ICD-10-CM | POA: Diagnosis present

## 2020-11-10 DIAGNOSIS — M810 Age-related osteoporosis without current pathological fracture: Secondary | ICD-10-CM | POA: Diagnosis present

## 2020-11-10 DIAGNOSIS — R41841 Cognitive communication deficit: Secondary | ICD-10-CM | POA: Diagnosis not present

## 2020-11-10 DIAGNOSIS — R5381 Other malaise: Secondary | ICD-10-CM | POA: Diagnosis not present

## 2020-11-10 DIAGNOSIS — D500 Iron deficiency anemia secondary to blood loss (chronic): Secondary | ICD-10-CM | POA: Diagnosis not present

## 2020-11-10 DIAGNOSIS — Z86711 Personal history of pulmonary embolism: Secondary | ICD-10-CM | POA: Diagnosis not present

## 2020-11-10 DIAGNOSIS — E1169 Type 2 diabetes mellitus with other specified complication: Secondary | ICD-10-CM | POA: Diagnosis not present

## 2020-11-10 DIAGNOSIS — G4733 Obstructive sleep apnea (adult) (pediatric): Secondary | ICD-10-CM | POA: Diagnosis present

## 2020-11-10 DIAGNOSIS — K625 Hemorrhage of anus and rectum: Secondary | ICD-10-CM | POA: Diagnosis not present

## 2020-11-10 DIAGNOSIS — M791 Myalgia, unspecified site: Secondary | ICD-10-CM | POA: Diagnosis not present

## 2020-11-10 DIAGNOSIS — F32A Depression, unspecified: Secondary | ICD-10-CM | POA: Diagnosis present

## 2020-11-10 DIAGNOSIS — K623 Rectal prolapse: Secondary | ICD-10-CM | POA: Diagnosis present

## 2020-11-10 DIAGNOSIS — R0902 Hypoxemia: Secondary | ICD-10-CM | POA: Diagnosis not present

## 2020-11-10 DIAGNOSIS — I69828 Other speech and language deficits following other cerebrovascular disease: Secondary | ICD-10-CM | POA: Diagnosis not present

## 2020-11-10 DIAGNOSIS — T1490XA Injury, unspecified, initial encounter: Secondary | ICD-10-CM | POA: Diagnosis present

## 2020-11-10 DIAGNOSIS — D631 Anemia in chronic kidney disease: Secondary | ICD-10-CM | POA: Diagnosis not present

## 2020-11-10 DIAGNOSIS — D649 Anemia, unspecified: Secondary | ICD-10-CM | POA: Diagnosis not present

## 2020-11-10 DIAGNOSIS — E119 Type 2 diabetes mellitus without complications: Secondary | ICD-10-CM | POA: Diagnosis not present

## 2020-11-10 LAB — GLUCOSE, CAPILLARY
Glucose-Capillary: 158 mg/dL — ABNORMAL HIGH (ref 70–99)
Glucose-Capillary: 136 mg/dL — ABNORMAL HIGH (ref 70–99)
Glucose-Capillary: 174 mg/dL — ABNORMAL HIGH (ref 70–99)
Glucose-Capillary: 215 mg/dL — ABNORMAL HIGH (ref 70–99)
Glucose-Capillary: 193 mg/dL — ABNORMAL HIGH (ref 70–99)
Glucose-Capillary: 153 mg/dL — ABNORMAL HIGH (ref 70–99)

## 2020-11-10 LAB — COMPREHENSIVE METABOLIC PANEL
ALT: 8 U/L (ref 0–44)
AST: 13 U/L — ABNORMAL LOW (ref 15–41)
Albumin: 1.7 g/dL — ABNORMAL LOW (ref 3.5–5.0)
Alkaline Phosphatase: 80 U/L (ref 38–126)
Anion gap: 9 (ref 5–15)
BUN: 12 mg/dL (ref 8–23)
CO2: 22 mmol/L (ref 22–32)
Calcium: 8.8 mg/dL — ABNORMAL LOW (ref 8.9–10.3)
Chloride: 106 mmol/L (ref 98–111)
Creatinine, Ser: 0.91 mg/dL (ref 0.44–1.00)
GFR, Estimated: >60 mL/min (ref >60)
Glucose, Bld: 169 mg/dL — ABNORMAL HIGH (ref 70–99)
Potassium: 4.2 mmol/L (ref 3.5–5.1)
Sodium: 137 mmol/L (ref 135–145)
Total Bilirubin: 0.5 mg/dL (ref 0.3–1.2)
Total Protein: 4.7 g/dL — ABNORMAL LOW (ref 6.5–8.1)

## 2020-11-10 LAB — CBC
HCT: 27.3 % — ABNORMAL LOW (ref 36.0–46.0)
HCT: 28.1 % — ABNORMAL LOW (ref 36.0–46.0)
HCT: 27 % — ABNORMAL LOW (ref 36.0–46.0)
Hemoglobin: 7.9 g/dL — ABNORMAL LOW (ref 12.0–15.0)
Hemoglobin: 8.3 g/dL — ABNORMAL LOW (ref 12.0–15.0)
Hemoglobin: 7.9 g/dL — ABNORMAL LOW (ref 12.0–15.0)
MCH: 22.5 pg — ABNORMAL LOW (ref 26.0–34.0)
MCH: 22.3 pg — ABNORMAL LOW (ref 26.0–34.0)
MCH: 22.4 pg — ABNORMAL LOW (ref 26.0–34.0)
MCHC: 28.9 g/dL — ABNORMAL LOW (ref 30.0–36.0)
MCHC: 29.5 g/dL — ABNORMAL LOW (ref 30.0–36.0)
MCHC: 29.3 g/dL — ABNORMAL LOW (ref 30.0–36.0)
MCV: 77.8 fL — ABNORMAL LOW (ref 80.0–100.0)
MCV: 75.5 fL — ABNORMAL LOW (ref 80.0–100.0)
MCV: 76.7 fL — ABNORMAL LOW (ref 80.0–100.0)
Platelets: 297 10*3/uL (ref 150–400)
Platelets: 305 10*3/uL (ref 150–400)
Platelets: 310 10*3/uL (ref 150–400)
RBC: 3.51 MIL/uL — ABNORMAL LOW (ref 3.87–5.11)
RBC: 3.72 MIL/uL — ABNORMAL LOW (ref 3.87–5.11)
RBC: 3.52 MIL/uL — ABNORMAL LOW (ref 3.87–5.11)
RDW: 19 % — ABNORMAL HIGH (ref 11.5–15.5)
RDW: 19 % — ABNORMAL HIGH (ref 11.5–15.5)
RDW: 19 % — ABNORMAL HIGH (ref 11.5–15.5)
WBC: 9.2 10*3/uL (ref 4.0–10.5)
WBC: 8 10*3/uL (ref 4.0–10.5)
WBC: 7.6 10*3/uL (ref 4.0–10.5)
nRBC: 0 % (ref 0.0–0.2)
nRBC: 0 % (ref 0.0–0.2)
nRBC: 0 % (ref 0.0–0.2)

## 2020-11-10 LAB — SARS CORONAVIRUS 2 (TAT 6-24 HRS): SARS Coronavirus 2: NEGATIVE

## 2020-11-10 MED ORDER — OXYCODONE-ACETAMINOPHEN 7.5-325 MG PO TABS
1.0000 | ORAL_TABLET | ORAL | Status: DC | PRN
Start: 2020-11-10 — End: 2020-11-12
  Administered 2020-11-10: 1 via ORAL
  Filled 2020-11-10: qty 1

## 2020-11-10 MED ORDER — DEXTROSE IN LACTATED RINGERS 5 % IV SOLN: INTRAVENOUS | Status: DC

## 2020-11-10 MED ORDER — SODIUM CHLORIDE 0.9 % IV SOLN: INTRAVENOUS | Status: DC

## 2020-11-10 MED ORDER — INSULIN ASPART 100 UNIT/ML IJ SOLN
0.0000 [IU] | INTRAMUSCULAR | Status: DC
  Administered 2020-11-10: 2 [IU] via SUBCUTANEOUS
  Administered 2020-11-10 – 2020-11-11 (×4): 1 [IU] via SUBCUTANEOUS
  Administered 2020-11-11 (×2): 2 [IU] via SUBCUTANEOUS

## 2020-11-10 NOTE — Consult Note (Signed)
Referring Provider:  ED Primary Care Physician:  [name], MD Primary Gastroenterologist:  Dr. Cristina Gong  Reason for Consultation: Symptomatic anemia  HPI: [name] is a 80 y.o. female with past medical history of PE diagnosed in May 2022 currently not on anticoagulation, history of rheumatoid arthritis on chronic low-dose prednisone, history of diabetes presented to the hospital yesterday with fall.  She was recently discharged after being admitted and treated for symptomatic anemia.  She was found to have hemoglobin of 5.3.  Please see previous GI work-up for details.  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  Because of lack of overt bleeding, she was discharged home and and her Eliquis was discontinued during last admission.  Patient seen and examined at bedside.  She denies seeing any black tarry stool or bright blood in the stool.  Denies abdominal pain, nausea or vomiting.  Complaining of occasional constipation.     Previous GI work-up   She was admitted in the hospital in June 2022 with GI bleed.  Underwent EGD and colonoscopy in August 22, 2020.     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  Biopsy showed hyperplastic polyps.   Colonoscopy showed rectal prolapse, diverticulosis and small tubular adenoma in the descending colon.  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  Past Medical History:  Diagnosis Date   Acute pulmonary embolism ([place])    b/l; dx'ed May '22   Asthma    Complication of anesthesia    Anesthesia told her years ago she got too cold during surgery   Diabetes mellitus without complication  ([place])    Emphysema    Fibromyalgia    Osteoporosis    Rheumatoid arthritis(714.0)    Sleep apnea    no cpap    Past Surgical History:  Procedure Laterality Date   ABDOMINAL HYSTERECTOMY     ANKLE FRACTURE SURGERY  2007   BREAST SURGERY     mammoplasty and then removed   COLONOSCOPY WITH PROPOFOL N/A 08/22/2020   Procedure: COLONOSCOPY WITH PROPOFOL;  Surgeon: Ronnette Juniper, MD;  Location: [facility];  Service: Gastroenterology;  Laterality: N/A;  If schedule availability permits, would like to add on upper endoscopy as well   ESOPHAGOGASTRODUODENOSCOPY (EGD) WITH PROPOFOL N/A 08/22/2020   Procedure: ESOPHAGOGASTRODUODENOSCOPY (EGD) WITH PROPOFOL;  Surgeon: Ronnette Juniper, MD;  Location: [facility];  Service: Gastroenterology;  Laterality: N/A;   FOOT SURGERY     numerous   GIVENS CAPSULE STUDY N/A 10/28/2020   Procedure: GIVENS CAPSULE STUDY;  Surgeon: Otis Brace, MD;  Location: [facility];  Service: Gastroenterology;  Laterality: N/A;   HOT HEMOSTASIS N/A 08/22/2020   Procedure: HOT HEMOSTASIS (ARGON PLASMA COAGULATION/BICAP);  Surgeon: Ronnette Juniper, MD;  Location: [facility];  Service: Gastroenterology;  Laterality: N/A;   I & D EXTREMITY Right 10/26/2014   Procedure: IRRIGATION AND DEBRIDEMENT RIGHT KNEE ;  Surgeon: Gaynelle Arabian, MD;  Location: [facility];  Service: Orthopedics;  Laterality: Right;   IRRIGATION AND DEBRIDEMENT KNEE Right 03/09/2017   Procedure: IRRIGATION AND DEBRIDEMENT RIGHT KNEE PRE-PATELLAR BURSA;  Surgeon: Susa Day, MD;  Location: [facility];  Service: Orthopedics;  Laterality: Right;   KNEE BURSECTOMY Right 09/16/2014   Procedure: EXCISON OF PRE PATELLA BURSA RIGHT KNEE ;  Surgeon: Gaynelle Arabian, MD;  Location: [facility];  Service: Orthopedics;  Laterality: Right;   PARTIAL HYSTERECTOMY  1970   POLYPECTOMY  08/22/2020   Procedure: POLYPECTOMY;  Surgeon: Ronnette Juniper, MD;  Location: [facility];  Service: Gastroenterology;;   TOTAL KNEE ARTHROPLASTY  2005 / 2006    x2    Prior to Admission medications   Medication Sig Start Date End Date Taking? Authorizing Provider  acetaminophen (TYLENOL) 500 MG tablet Take 1,000 mg by mouth every 6 (six) hours as needed for mild pain (or headaches).   Yes [provider]  albuterol (VENTOLIN HFA) 108 (90 Base) MCG/ACT inhaler Inhale 2 puffs into the lungs every 6 (six) hours as needed for shortness of breath or wheezing. 07/05/19  Yes [provider]  arformoterol (BROVANA) 15 MCG/2ML NEBU Take 15 mcg by nebulization 2 (two) times daily as needed (for shortness of breath).   Yes [provider]  atorvastatin (LIPITOR) 20 MG tablet Take 20 mg by mouth daily. 10/23/20  Yes [provider]  budesonide (PULMICORT) 0.25 MG/2ML nebulizer solution Take 2 mLs (0.25 mg total) by nebulization 2 (two) times daily. 05/26/20 10/27/21 Yes [name], MD  Cyanocobalamin (B-12) 2500 MCG TABS Take 2,500 mcg by mouth at bedtime.    Yes [provider]  DULoxetine (CYMBALTA) 60 MG capsule Take 60 mg by mouth at bedtime.    Yes [provider]  ezetimibe (ZETIA) 10 MG tablet Take 10 mg by mouth at bedtime.  11/04/16  Yes [provider]  ferrous gluconate (FERGON) 324 MG tablet Take 324 mg by mouth every other day.   Yes [provider]  ipratropium-albuterol (DUONEB) 0.5-2.5 (3) MG/3ML SOLN Take 3 mLs by nebulization every 6 (six) hours as needed (wheezing). 07/19/20  Yes [provider]  metFORMIN (GLUCOPHAGE) 500 MG tablet Take 500 mg by mouth 2 (two) times daily. 10/25/20  Yes [provider]  NOVOLIN 70/30 KWIKPEN (70-30) 100 UNIT/ML KwikPen Inject 10 Units into the skin 2 (two) times daily. 10/20/20  Yes [provider]  olopatadine (PATANOL) 0.1 % ophthalmic solution Place 1 drop into both eyes 2 (two) times daily. Patient taking differently: Place 1 drop into both eyes 2 (two) times daily as needed for allergies. 10/30/20  Yes Samuella Cota, MD  ondansetron (ZOFRAN-ODT) 8 MG disintegrating tablet Take 8 mg by mouth every 8 (eight) hours as needed for nausea or vomiting (dissolve orally). 10/24/20  Yes [provider]  pantoprazole (PROTONIX) 40 MG tablet Take one tablet once every morning Patient taking differently: Take 40 mg by mouth daily before breakfast. 05/26/20  Yes Kc, Ramesh, MD  tolterodine (DETROL LA) 2 MG 24 hr capsule Take 2 mg by mouth every evening. 08/15/20  Yes [provider]  B-D UF III MINI PEN NEEDLES 31G X 5 MM MISC SMARTSIG:1 Each SUB-Q 4 Times Daily 10/22/19   [provider]  predniSONE (DELTASONE) 5 MG tablet Take 5 mg by mouth at bedtime.    [provider]  MYRBETRIQ 50 MG TB24 tablet Take 50 mg by mouth at bedtime. Patient not taking: Reported on 08/19/2020 03/29/20 08/19/20  [provider]    Scheduled Meds:  atorvastatin  20 mg Oral Daily   budesonide  0.25 mg Nebulization BID   DULoxetine  60 mg Oral QHS   ezetimibe  10 mg Oral  QHS   hydrocortisone sod succinate (SOLU-CORTEF) inj  50 mg Intravenous Q12H   pantoprazole (PROTONIX) IV  40 mg Intravenous Q12H   vitamin B-12  2,500 mcg Oral QHS   Continuous Infusions:  dextrose 5% lactated ringers     PRN Meds:.acetaminophen **OR** acetaminophen, arformoterol, ipratropium-albuterol, olopatadine  Allergies as of 11/07/2020 - Review Complete 10/28/2020  Allergen Reaction Noted   Penicillins Hives    Sulfamethoxazole-trimethoprim Nausea And Vomiting and Other (See Comments) 08/21/2017   Adalimumab Other (See Comments) 10/27/2020   Hydroxychloroquine sulfate Other (See Comments) 10/27/2020   Meperidine Other (See Comments) 05/14/2018   Meperidine hcl Other (See Comments) 06/27/2020   Methotrexate Nausea And Vomiting and Other (See Comments) 10/27/2020   Penicillin g sodium Hives 06/27/2020   Phentermine-topiramate Nausea Only and Other (See Comments) 10/27/2020   Sulfa antibiotics Nausea And Vomiting and  Other (See Comments) 05/14/2018   Sulfonamide derivatives Nausea And Vomiting and Other (See Comments)     Family History  Problem Relation Age of Onset   Hypertension Other    Diabetes Other    Stroke Other    Diabetes Mother    Diabetes Father    Diabetes Sister    Diabetes Maternal Aunt    Cancer Paternal Uncle    Cancer Paternal Grandmother    Breast cancer Paternal Grandmother     Social History   Socioeconomic History   Marital status: Divorced    Spouse name: Not on file   Number of children: Not on file   Years of education: Not on file   Highest education level: Not on file  Occupational History   Not on file  Tobacco Use   Smoking status: Former    Types: Cigarettes    Quit date: 03/11/1994    Years since quitting: 26.6   Smokeless tobacco: Never  Vaping Use   Vaping Use: Never used  Substance and Sexual Activity   Alcohol use: No   Drug use: No   Sexual activity: Not on file  Other Topics Concern   Not on file  Social History Narrative   Not on file   Social Determinants of Health   Financial Resource Strain: Not on file  Food Insecurity: No Food Insecurity   Worried About Charity fundraiser in the Last Year: Never true   Arboriculturist in the Last Year: Never true  Transportation Needs: Unmet Transportation Needs   Lack of Transportation (Medical): Yes   Lack of Transportation (Non-Medical): Yes  Physical Activity: Not on file  Stress: Not on file  Social Connections: Not on file  Intimate Partner Violence: Not on file    Review of Systems: All negative except as stated above in HPI.  Physical Exam: Vital signs: Vitals:   11/10/20 0003 11/10/20 0358  BP: (!) 99/59 111/64  Pulse: 93 88  Resp: 17 17  Temp: 97.7 F (36.5 C) (!) 97.5 F (36.4 C)  SpO2: 91% 91%   Last BM Date: 11/10/20 General:   Elderly patient, not in acute distress pleasant and cooperative in NAD Lungs:  Clear throughout to auscultation.  Anterior exam only Heart:   Regular rate and rhythm; no murmurs, clicks, rubs,  or gallops. Abdomen: Soft, nontender, nondistended, bowel sounds present.  No peritoneal signs Neuro : Alert and oriented x3 Psych : Anxious Rectal:  Deferred  GI:  Lab Results: Recent Labs    11/10/20 0138 11/10/20 0440 11/10/20 0844  WBC 9.2 8.0 7.6  HGB 7.9* 8.3*  7.9*  HCT 27.3* 28.1* 27.0*  PLT 297 305 310   BMET Recent Labs    11/07/20 1829 11/10/20 0440  NA 134* 137  K 3.7 4.2  CL 102 106  CO2 22 22  GLUCOSE 137* 169*  BUN 12 12  CREATININE 0.75 0.91  CALCIUM 8.8* 8.8*   LFT Recent Labs    11/10/20 0440  PROT 4.7*  ALBUMIN 1.7*  AST 13*  ALT 8  ALKPHOS 80  BILITOT 0.5   PT/INR No results for input(s): LABPROT, INR in the last 72 hours.   Studies/Results: CT HEAD WO CONTRAST (5MM)  Result Date: 11/09/2020 CLINICAL DATA:  Mental status change.  Unknown cause EXAM: CT HEAD WITHOUT CONTRAST TECHNIQUE: Contiguous axial images were obtained from the base of the skull through the vertex without intravenous contrast. COMPARISON:  CT head 11/07/2020 BRAIN: BRAIN Cerebral ventricle sizes are concordant with the degree of cerebral volume loss. Patchy and confluent areas of decreased attenuation are noted throughout the deep and periventricular white matter of the cerebral hemispheres bilaterally, compatible with chronic microvascular ischemic disease. No evidence of large-territorial acute infarction. No parenchymal hemorrhage. No mass lesion. No extra-axial collection. No mass effect or midline shift. No hydrocephalus. Basilar cisterns are patent. Vascular: No hyperdense vessel. Atherosclerotic calcifications are present within the cavernous internal carotid and vertebral arteries. Skull: No acute fracture or focal lesion. Sinuses/Orbits: Paranasal sinuses and mastoid air cells are clear. Bilateral lens replacement. Otherwise the orbits are unremarkable. Other: None. IMPRESSION: No acute intracranial abnormality.  Electronically Signed   By: Iven Finn M.D.   On: 11/09/2020 20:21   DG Chest Port 1 View  Result Date: 11/09/2020 CLINICAL DATA:  Fever EXAM: PORTABLE CHEST 1 VIEW COMPARISON:  11/07/2020 FINDINGS: Stable cardiomediastinal contours. Small left pleural effusion. No focal airspace consolidation. No pneumothorax. No acute bony findings. Degenerative changes of the bilateral shoulders. IMPRESSION: Small left pleural effusion. Electronically Signed   By: Davina Poke D.O.   On: 11/09/2020 18:27    Impression/Plan: -Symptomatic anemia with hemoglobin of 5.3.  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 -Acute blood loss anemia.  Hemoglobin improved to 7.9 with blood transfusion -Stable atrial fibrillation.  Not on anticoagulation now  Recommendations ----------------------- -Offered push enteroscopy today but patient declined because he was feeling weak and fatigue. -Okay to have soft diet today.  N.p.o. past midnight. -push enteroscopy tomorrow -Continue PPI -Her H&H.  Transfuse if hemoglobin less than 8 -GI will follow  Risks (bleeding, infection, bowel perforation that could require surgery, sedation-related changes in cardiopulmonary systems), benefits (identification and possible treatment of source of symptoms, exclusion of certain causes of symptoms), and alternatives (watchful waiting, radiographic imaging studies, empiric medical treatment)  were explained to patient/family in detail and patient wishes to proceed.     LOS: 0 days   Otis Brace  MD, [place] 11/10/2020, 9:59 AM  Contact #  [phone]

## 2020-11-10 NOTE — Progress Notes (Signed)
[name] is a 80 y.o. female patient admitted to [facility]. Awake, alert - oriented  X 2-3 - no acute distress noted.  VSS - Blood pressure (!) 99/59, pulse 93, temperature 97.7 F (36.5 C), temperature source Oral, resp. rate 17, height '5\' 5"'$  (1.651 m), weight 67.1 kg, SpO2 91 %.    IV in place, occlusive dsg intact without redness.   Will cont to eval and treat per MD orders.  Vidal Schwalbe, RN 11/10/2020 12:14 AM

## 2020-11-10 NOTE — H&P (View-Only) (Signed)
Referring Provider:  ED Primary Care Physician:  [name], MD Primary Gastroenterologist:  Dr. Cristina Gong  Reason for Consultation: Symptomatic anemia  HPI: [name] is a 80 y.o. female with past medical history of PE diagnosed in May 2022 currently not on anticoagulation, history of rheumatoid arthritis on chronic low-dose prednisone, history of diabetes presented to the hospital yesterday with fall.  She was recently discharged after being admitted and treated for symptomatic anemia.  She was found to have hemoglobin of 5.3.  Please see previous GI work-up for details.  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  Because of lack of overt bleeding, she was discharged home and and her Eliquis was discontinued during last admission.  Patient seen and examined at bedside.  She denies seeing any black tarry stool or bright blood in the stool.  Denies abdominal pain, nausea or vomiting.  Complaining of occasional constipation.     Previous GI work-up   She was admitted in the hospital in June 2022 with GI bleed.  Underwent EGD and colonoscopy in August 22, 2020.     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  Biopsy showed hyperplastic polyps.   Colonoscopy showed rectal prolapse, diverticulosis and small tubular adenoma in the descending colon.  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  Past Medical History:  Diagnosis Date   Acute pulmonary embolism ([place])    b/l; dx'ed May '22   Asthma    Complication of anesthesia    Anesthesia told her years ago she got too cold during surgery   Diabetes mellitus without complication  ([place])    Emphysema    Fibromyalgia    Osteoporosis    Rheumatoid arthritis(714.0)    Sleep apnea    no cpap    Past Surgical History:  Procedure Laterality Date   ABDOMINAL HYSTERECTOMY     ANKLE FRACTURE SURGERY  2007   BREAST SURGERY     mammoplasty and then removed   COLONOSCOPY WITH PROPOFOL N/A 08/22/2020   Procedure: COLONOSCOPY WITH PROPOFOL;  Surgeon: Ronnette Juniper, MD;  [facility]: [facility];  Service: Gastroenterology;  Laterality: N/A;  If schedule availability permits, would like to add on upper endoscopy as well   ESOPHAGOGASTRODUODENOSCOPY (EGD) WITH PROPOFOL N/A 08/22/2020   Procedure: ESOPHAGOGASTRODUODENOSCOPY (EGD) WITH PROPOFOL;  Surgeon: Ronnette Juniper, MD;  [facility];  Service: Gastroenterology;  Laterality: N/A;   FOOT SURGERY     numerous   GIVENS CAPSULE STUDY N/A 10/28/2020   Procedure: GIVENS CAPSULE STUDY;  Surgeon: Otis Brace, MD;  [facility];  Service: Gastroenterology;  Laterality: N/A;   HOT HEMOSTASIS N/A 08/22/2020   Procedure: HOT HEMOSTASIS (ARGON PLASMA COAGULATION/BICAP);  Surgeon: Ronnette Juniper, MD;  Location: [facility];  Service: Gastroenterology;  Laterality: N/A;   I & D EXTREMITY Right 10/26/2014   Procedure: IRRIGATION AND DEBRIDEMENT RIGHT KNEE ;  Surgeon: Gaynelle Arabian, MD;  [facility];  Service: Orthopedics;  Laterality: Right;   IRRIGATION AND DEBRIDEMENT KNEE Right 03/09/2017   Procedure: IRRIGATION AND DEBRIDEMENT RIGHT KNEE PRE-PATELLAR BURSA;  Surgeon: Susa Day, MD;  Location: [facility];  Service: Orthopedics;  Laterality: Right;   KNEE BURSECTOMY Right 09/16/2014   Procedure: EXCISON OF PRE PATELLA BURSA RIGHT KNEE ;  Surgeon: Gaynelle Arabian, MD;  [facility];  Service: Orthopedics;  Laterality: Right;   PARTIAL HYSTERECTOMY  1970   POLYPECTOMY  08/22/2020   Procedure: POLYPECTOMY;  Surgeon: Ronnette Juniper, MD;  [facility];  Service: Gastroenterology;;   TOTAL KNEE ARTHROPLASTY  2005 / 2006    x2    Prior to Admission medications   Medication Sig Start Date End Date Taking? Authorizing Provider  acetaminophen (TYLENOL) 500 MG tablet Take 1,000 mg by mouth every 6 (six) hours as needed for mild pain (or headaches).   Yes [provider]  albuterol (VENTOLIN HFA) 108 (90 Base) MCG/ACT inhaler Inhale 2 puffs into the lungs every 6 (six) hours as needed for shortness of breath or wheezing. 07/05/19  Yes [provider]  arformoterol (BROVANA) 15 MCG/2ML NEBU Take 15 mcg by nebulization 2 (two) times daily as needed (for shortness of breath).   Yes [provider]  atorvastatin (LIPITOR) 20 MG tablet Take 20 mg by mouth daily. 10/23/20  Yes [provider]  budesonide (PULMICORT) 0.25 MG/2ML nebulizer solution Take 2 mLs (0.25 mg total) by nebulization 2 (two) times daily. 05/26/20 10/27/21 Yes [name], MD  Cyanocobalamin (B-12) 2500 MCG TABS Take 2,500 mcg by mouth at bedtime.    Yes [provider]  DULoxetine (CYMBALTA) 60 MG capsule Take 60 mg by mouth at bedtime.    Yes [provider]  ezetimibe (ZETIA) 10 MG tablet Take 10 mg by mouth at bedtime.  11/04/16  Yes [provider]  ferrous gluconate (FERGON) 324 MG tablet Take 324 mg by mouth every other day.   Yes [provider]  ipratropium-albuterol (DUONEB) 0.5-2.5 (3) MG/3ML SOLN Take 3 mLs by nebulization every 6 (six) hours as needed (wheezing). 07/19/20  Yes [provider]  metFORMIN (GLUCOPHAGE) 500 MG tablet Take 500 mg by mouth 2 (two) times daily. 10/25/20  Yes [provider]  NOVOLIN 70/30 KWIKPEN (70-30) 100 UNIT/ML KwikPen Inject 10 Units into the skin 2 (two) times daily. 10/20/20  Yes [provider]  olopatadine (PATANOL) 0.1 % ophthalmic solution Place 1 drop into both eyes 2 (two) times daily. Patient taking differently: Place 1 drop into both eyes 2 (two) times daily as needed for allergies. 10/30/20  Yes Samuella Cota, MD  ondansetron (ZOFRAN-ODT) 8 MG disintegrating tablet Take 8 mg by mouth every 8 (eight) hours as needed for nausea or vomiting (dissolve orally). 10/24/20  Yes [provider]  pantoprazole (PROTONIX) 40 MG tablet Take one tablet once every morning Patient taking differently: Take 40 mg by mouth daily before breakfast. 05/26/20  Yes Kc, Ramesh, MD  tolterodine (DETROL LA) 2 MG 24 hr capsule Take 2 mg by mouth every evening. 08/15/20  Yes [provider]  B-D UF III MINI PEN NEEDLES 31G X 5 MM MISC SMARTSIG:1 Each SUB-Q 4 Times Daily 10/22/19   [provider]  predniSONE (DELTASONE) 5 MG tablet Take 5 mg by mouth at bedtime.    [provider]  MYRBETRIQ 50 MG TB24 tablet Take 50 mg by mouth at bedtime. Patient not taking: Reported on 08/19/2020 03/29/20 08/19/20  [provider]    Scheduled Meds:  atorvastatin  20 mg Oral Daily   budesonide  0.25 mg Nebulization BID   DULoxetine  60 mg Oral QHS   ezetimibe  10 mg Oral  QHS   hydrocortisone sod succinate (SOLU-CORTEF) inj  50 mg Intravenous Q12H   pantoprazole (PROTONIX) IV  40 mg Intravenous Q12H   vitamin B-12  2,500 mcg Oral QHS   Continuous Infusions:  dextrose 5% lactated ringers     PRN Meds:.acetaminophen **OR** acetaminophen, arformoterol, ipratropium-albuterol, olopatadine  Allergies as of 11/07/2020 - Review Complete 10/28/2020  Allergen Reaction Noted   Penicillins Hives    Sulfamethoxazole-trimethoprim Nausea And Vomiting and Other (See Comments) 08/21/2017   Adalimumab Other (See Comments) 10/27/2020   Hydroxychloroquine sulfate Other (See Comments) 10/27/2020   Meperidine Other (See Comments) 05/14/2018   Meperidine hcl Other (See Comments) 06/27/2020   Methotrexate Nausea And Vomiting and Other (See Comments) 10/27/2020   Penicillin g sodium Hives 06/27/2020   Phentermine-topiramate Nausea Only and Other (See Comments) 10/27/2020   Sulfa antibiotics Nausea And Vomiting and  Other (See Comments) 05/14/2018   Sulfonamide derivatives Nausea And Vomiting and Other (See Comments)     Family History  Problem Relation Age of Onset   Hypertension Other    Diabetes Other    Stroke Other    Diabetes Mother    Diabetes Father    Diabetes Sister    Diabetes Maternal Aunt    Cancer Paternal Uncle    Cancer Paternal Grandmother    Breast cancer Paternal Grandmother     Social History   Socioeconomic History   Marital status: Divorced    Spouse name: Not on file   Number of children: Not on file   Years of education: Not on file   Highest education level: Not on file  Occupational History   Not on file  Tobacco Use   Smoking status: Former    Types: Cigarettes    Quit date: 03/11/1994    Years since quitting: 26.6   Smokeless tobacco: Never  Vaping Use   Vaping Use: Never used  Substance and Sexual Activity   Alcohol use: No   Drug use: No   Sexual activity: Not on file  Other Topics Concern   Not on file  Social History Narrative   Not on file   Social Determinants of Health   Financial Resource Strain: Not on file  Food Insecurity: No Food Insecurity   Worried About Charity fundraiser in the Last Year: Never true   Arboriculturist in the Last Year: Never true  Transportation Needs: Unmet Transportation Needs   Lack of Transportation (Medical): Yes   Lack of Transportation (Non-Medical): Yes  Physical Activity: Not on file  Stress: Not on file  Social Connections: Not on file  Intimate Partner Violence: Not on file    Review of Systems: All negative except as stated above in HPI.  Physical Exam: Vital signs: Vitals:   11/10/20 0003 11/10/20 0358  BP: (!) 99/59 111/64  Pulse: 93 88  Resp: 17 17  Temp: 97.7 F (36.5 C) (!) 97.5 F (36.4 C)  SpO2: 91% 91%   Last BM Date: 11/10/20 General:   Elderly patient, not in acute distress pleasant and cooperative in NAD Lungs:  Clear throughout to auscultation.  Anterior exam only Heart:   Regular rate and rhythm; no murmurs, clicks, rubs,  or gallops. Abdomen: Soft, nontender, nondistended, bowel sounds present.  No peritoneal signs Neuro : Alert and oriented x3 Psych : Anxious Rectal:  Deferred  GI:  Lab Results: Recent Labs    11/10/20 0138 11/10/20 0440 11/10/20 0844  WBC 9.2 8.0 7.6  HGB 7.9* 8.3*  7.9*  HCT 27.3* 28.1* 27.0*  PLT 297 305 310   BMET Recent Labs    11/07/20 1829 11/10/20 0440  NA 134* 137  K 3.7 4.2  CL 102 106  CO2 22 22  GLUCOSE 137* 169*  BUN 12 12  CREATININE 0.75 0.91  CALCIUM 8.8* 8.8*   LFT Recent Labs    11/10/20 0440  PROT 4.7*  ALBUMIN 1.7*  AST 13*  ALT 8  ALKPHOS 80  BILITOT 0.5   PT/INR No results for input(s): LABPROT, INR in the last 72 hours.   Studies/Results: CT HEAD WO CONTRAST (5MM)  Result Date: 11/09/2020 CLINICAL DATA:  Mental status change.  Unknown cause EXAM: CT HEAD WITHOUT CONTRAST TECHNIQUE: Contiguous axial images were obtained from the base of the skull through the vertex without intravenous contrast. COMPARISON:  CT head 11/07/2020 BRAIN: BRAIN Cerebral ventricle sizes are concordant with the degree of cerebral volume loss. Patchy and confluent areas of decreased attenuation are noted throughout the deep and periventricular white matter of the cerebral hemispheres bilaterally, compatible with chronic microvascular ischemic disease. No evidence of large-territorial acute infarction. No parenchymal hemorrhage. No mass lesion. No extra-axial collection. No mass effect or midline shift. No hydrocephalus. Basilar cisterns are patent. Vascular: No hyperdense vessel. Atherosclerotic calcifications are present within the cavernous internal carotid and vertebral arteries. Skull: No acute fracture or focal lesion. Sinuses/Orbits: Paranasal sinuses and mastoid air cells are clear. Bilateral lens replacement. Otherwise the orbits are unremarkable. Other: None. IMPRESSION: No acute intracranial abnormality.  Electronically Signed   By: Iven Finn M.D.   On: 11/09/2020 20:21   DG Chest Port 1 View  Result Date: 11/09/2020 CLINICAL DATA:  Fever EXAM: PORTABLE CHEST 1 VIEW COMPARISON:  11/07/2020 FINDINGS: Stable cardiomediastinal contours. Small left pleural effusion. No focal airspace consolidation. No pneumothorax. No acute bony findings. Degenerative changes of the bilateral shoulders. IMPRESSION: Small left pleural effusion. Electronically Signed   By: Davina Poke D.O.   On: 11/09/2020 18:27    Impression/Plan: -Symptomatic anemia with hemoglobin of 5.3.  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 -Acute blood loss anemia.  Hemoglobin improved to 7.9 with blood transfusion -Stable atrial fibrillation.  Not on anticoagulation now  Recommendations ----------------------- -Offered push enteroscopy today but patient declined because he was feeling weak and fatigue. -Okay to have soft diet today.  N.p.o. past midnight. -push enteroscopy tomorrow -Continue PPI -Her H&H.  Transfuse if hemoglobin less than 8 -GI will follow  Risks (bleeding, infection, bowel perforation that could require surgery, sedation-related changes in cardiopulmonary systems), benefits (identification and possible treatment of source of symptoms, exclusion of certain causes of symptoms), and alternatives (watchful waiting, radiographic imaging studies, empiric medical treatment)  were explained to patient/family in detail and patient wishes to proceed.     LOS: 0 days   Otis Brace  MD, [place] 11/10/2020, 9:59 AM  Contact #  [phone]

## 2020-11-10 NOTE — Consult Note (Signed)
   [facility] Inpatient Consult   11/10/2020  [name] 04/07/1940 [id number]  [name] [ACO] Patient:  Medicare CMS DCE  Primary Care Provider:  [name], MD listed and endorsed by patient [place] a call from patient's [name] who alerted that patient has admitted today. Patient is currently active with [name] for chronic disease management services.  Patient has been engaged by a [name] but difficulty maintaining the call for appropriate program review.  Reviewed progress notes and inpatient TOC   1520 Met with the patient at the bedside and spoke with her at length regarding hospitalizations and missed calls with her RN Care Coordinator.  She states she has been so weak and not able to do much other than some small self care.  She states her son lives across the street from her and she has a dog he is checking on for her. She uses the [facility] transportation and is appreciative for it with her motorized wheelchair.  She verbalizes wanting to update her Advanced Directives and POA information.  Expressed to her that sometimes a Palliative Care consult can be very helpful and directional. She states that she and her daughter were working things out and wanted to update her AD.  She states, "my daughter is a Marine scientist and I think this would be good for me to do."   She states, "Can you asked for these orders for me, as I would like to have this updated and some things changed for."   Plan: Will message attending  MD with patient's request when available via secure chat. Follow up patient for progress.  Spoke with Inpatient Transition Of Care [TOC] [name] team member to make aware that [name] following. Of note, [name] services does not replace or interfere with any services that are needed or arranged by inpatient [name] care management team.     For additional questions or referrals please contact:   Natividad Brood, RN BSN [facility] Liaison  [phone] business mobile phone Toll free office [phone]  Fax number: [phone] [email or web address]

## 2020-11-10 NOTE — TOC Initial Note (Signed)
Transition of Care [facility]) - Initial/Assessment Note    Patient Details  Name: [name] MRN: [id number] Date of Birth: 21-May-1940  [facility]) CM/SW Contact:    Emeterio Reeve, LCSW Phone Number: 11/10/2020, 1:08 PM  Clinical Narrative:                  CSW received SNF consult. CSW met with pt at bedside. CSW introduced self and explained role at the hospital. Pt reports that PTA she lived at home alone. Pt reports she was independent with mobility and ADL's.   CSW reviewed PT/OT recommendations for SNF. Pt reports she is ok with SNF. Pt was sent out for SNF in the ED. CSW gave pt all bed offers. Pt reports they are covid vaccinated.  CSW will continue to follow.   Expected Discharge Plan: Skilled Nursing Facility Barriers to Discharge: Continued Medical Work up   Patient Goals and CMS Choice Patient states their goals for this hospitalization and ongoing recovery are:: to get stronger CMS Medicare.gov Compare Post Acute Care list provided to:: Patient Choice offered to / list presented to : Patient  Expected Discharge Plan and Services Expected Discharge Plan: [place] arrangements for the past 2 months: Single Family Home                                      Prior Living Arrangements/Services Living arrangements for the past 2 months: Single Family Home Lives with:: Self Patient language and need for interpreter reviewed:: Yes Do you feel safe going back to the place where you live?: Yes      Need for Family Participation in Patient Care: Yes (Comment) Care giver support system in place?: Yes (comment) Current home services: Home OT, Home PT Criminal Activity/Legal Involvement Pertinent to Current Situation/Hospitalization: No - Comment as needed  Activities of Daily Living      Permission Sought/Granted Permission sought to share information with : Family Supports, Chartered certified accountant  granted to share information with : Yes, Verbal Permission Granted     Permission granted to share info w AGENCY: SNF        Emotional Assessment Appearance:: Appears stated age Attitude/Demeanor/Rapport: Engaged Affect (typically observed): Appropriate Orientation: : Oriented to Self, Oriented to Place, Oriented to  Time, Oriented to Situation   Psych Involvement: No (comment)  Admission diagnosis:  Injury [T14.90XA] Rectal bleeding [K62.5] Pain [R52] Fall, initial encounter [W19.XXXA] Gastrointestinal hemorrhage, unspecified gastrointestinal hemorrhage type [K92.2] Anemia, unspecified type [D64.9] Acute GI bleeding [K92.2] Patient Active Problem List   Diagnosis Date Noted   Acute GI bleeding 11/10/2020   Rectal bleeding 11/09/2020   Acute encephalopathy 11/09/2020   Aortic atherosclerosis ([place]) 10/28/2020   Acute blood loss anemia 10/27/2020   AKI (acute kidney injury) ([place]) 10/27/2020   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   History of pulmonary embolism 10/27/2020   GI bleed 08/20/2020   Acute lower GI bleeding 08/19/2020   Acute pulmonary embolism ([place]) 07/21/2020   Hypoxia 05/23/2020   Acute hypoxemic respiratory failure ([place]) 05/22/2020   Rotator cuff arthropathy of left shoulder 12/13/2019   Pain in joint of right shoulder 12/10/2019   Pain in joint of left shoulder 12/10/2019   Hearing loss, sensorineural, 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   Tinnitus, left 01/08/2019   DM type 2, not at goal [facility]) 06/03/2018   Frequent infections 05/21/2018  Gangrene ([place]) 05/21/2018   Pain in finger 05/07/2018   Family history of aneurysm 05/07/2018   Diabetes mellitus due to underlying condition, controlled, with complication, without long-term current use of insulin ([place])    Lower urinary tract infectious disease    Palpitations 01/17/2018   Dehydration 01/17/2018   Hypotension 01/17/2018   Symptom of blood in stool 01/17/2018   Diarrhea in adult patient 01/17/2018   Presence of right  artificial knee joint 08/21/2017   Aftercare following joint replacement 08/21/2017   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 with presence of antibiotic-impregnated cement spacer 06/05/2017   Closed displaced transverse fracture of right patella 05/06/2017   Anemia 04/16/2017   Chronic interstitial lung disease ([place]) 04/16/2017   Chronic kidney disease (CKD), stage III (moderate) ([place]) 04/16/2017   Other hyperlipidemia 04/16/2017   Type 2 diabetes mellitus without complication, with long-term current use of insulin ([place]) 04/16/2017   Pain in right knee 03/25/2017   Aftercare 03/25/2017   Arthritis, septic, knee ([place]) 03/09/2017   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 [place] [address] Phone: [phone] Fax: [phone]     Social Determinants of Health (SDOH) Interventions    Readmission Risk Interventions No flowsheet data found.  Emeterio Reeve, LCSW Clinical Social Worker

## 2020-11-10 NOTE — Progress Notes (Signed)
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  Presented to emergency room 11/07/2020 with a fall from bed Patient was cleared for discharge home and was waiting to be sent home, became confused hypotensive started having rectal bleeding with a fever 100.4 hemoglobin dropped from 8-5.3 patient was ordered 2 units PRBC  Hospital-Problem based course  Acute blood loss anemia Status post 2 units PRBC 9/1--as no further bleeding can check only daily Suspect AVM.  Defer to Dr. Dulcy Fanny EGD in am 9/3--NPO for now Continue Protonix iv 40 bid Resume ferrous gluconate q. OD on discharge Prior pulmonary embolism-Eliquis d/c 10/30/20 CT last admit not revealing for PE--Hold further DOAC Rh Arthritis on Prednisone as OP Prednisone hed--stress dosing Cortef 50 q 12 Given 3 liters fluids in ED--start d5/LR 75 cc/h Monitor BP trends DM TY 2 D5 LR as above -Home med metformin 500 twice daily, 70/30 insulin 10 twice daily held at this time Would get every 4 sliding scale coverage only at this time as is n.p.o. OSA not on BiPAP  COPD--not oxygen dependent priro to admit May continue albuterol 6 as needed Brovana 2 puffs twice daily Pulmicort 0.25 twice daily DuoNeb every 6 Steroids as above Obtain  desat screen and discontinue oxygen if no needs Depression/anxiety Can continue Cymbalta 60 hs  DVT prophylaxis: SCD Code Status: Full code Family Communication: discussed with Pam daughter [phone] Disposition:  Status is: Inpatient  Remains inpatient appropriate because:Persistent severe electrolyte disturbances, Unsafe d/c plan, and IV treatments appropriate due to intensity of illness or inability to take PO  Dispo: The patient is from: Home              Anticipated d/c is to: SNF              Patient currently is not medically stable to d/c.   Difficult to place patient No  Consultants:  Gastroenterology  Procedures:   Antimicrobials:     Subjective: Awake coherent seems a little hard of hearing and slightly sleepy No further bleeding overnight No chest pain no fever no chills Coherent  Objective: Vitals:   11/09/20 2200 11/09/20 2344 11/10/20 0003 11/10/20 0358  BP: (!) 100/54  (!) 99/59 111/64  Pulse: 91  93 88  Resp: (!) '23  17 17  '$ Temp:  (!) 97.3 F (36.3 C) 97.7 F (36.5 C) (!) 97.5 F (36.4 C)  TempSrc:  Oral Oral Oral  SpO2: 99%  91% 91%  Weight:      Height:        Intake/Output Summary (Last 24 hours) at 11/10/2020 0724 Last data filed at 11/10/2020 0003 Gross per 24 hour  Intake 2500 ml  Output 700 ml  Net 1800 ml   Filed Weights   11/07/20 1812  Weight: 67.1 kg    Examination:  Awake hard of hearing pleasant coherent mild pallor no ict CTA B no added sound  no rales no rhonchi Abdomen is soft slightly distended, no rebound no guarding ROM is intact moving 4 limbs equally without significant deficit Neurologically intact Psych euthymic   Data Reviewed: personally reviewed   CBC    Component Value Date/Time   WBC 8.0 11/10/2020 0440   RBC 3.72 (L) 11/10/2020 0440   HGB 8.3 (L) 11/10/2020 0440   HGB 13.3 08/30/2015 1649   HCT 28.1 (L) 11/10/2020 0440   HCT 41.2 08/30/2015 1649   PLT 305 11/10/2020 0440   PLT 379 08/30/2015  1649   MCV 75.5 (L) 11/10/2020 0440   MCV 83 08/30/2015 1649   MCH 22.3 (L) 11/10/2020 0440   MCHC 29.5 (L) 11/10/2020 0440   RDW 19.0 (H) 11/10/2020 0440   RDW 15.1 08/30/2015 1649   LYMPHSABS 1.4 11/07/2020 1829   LYMPHSABS 3.7 (H) 08/30/2015 1649   MONOABS 0.5 11/07/2020 1829   EOSABS 0.1 11/07/2020 1829   EOSABS 0.3 08/30/2015 1649   BASOSABS 0.1 11/07/2020 1829   BASOSABS 0.1 08/30/2015 1649   CMP Latest Ref Rng & Units 11/10/2020 11/07/2020 10/28/2020  Glucose 70 - 99 mg/dL 169(H) 137(H) 174(H)  BUN 8 - 23 mg/dL '12 12 20  '$ Creatinine 0.44 - 1.00 mg/dL 0.91 0.75 0.91  Sodium 135 - 145 mmol/L 137 134(L) 137  Potassium 3.5 - 5.1 mmol/L 4.2 3.7 4.2  Chloride 98 - 111 mmol/L 106 102 108  CO2 22 - 32 mmol/L '22 22 22  '$ Calcium 8.9 - 10.3 mg/dL 8.8(L) 8.8(L) 8.4(L)  Total Protein 6.5 - 8.1 g/dL 4.7(L) - -  Total Bilirubin 0.3 - 1.2 mg/dL 0.5 - -  Alkaline Phos 38 - 126 U/L 80 - -  AST 15 - 41 U/L 13(L) - -  ALT 0 - 44 U/L 8 - -     Radiology Studies: CT HEAD WO CONTRAST (5MM)  Result Date: 11/09/2020 CLINICAL DATA:  Mental status change.  Unknown cause EXAM: CT HEAD WITHOUT CONTRAST TECHNIQUE: Contiguous axial images were obtained from the base of the skull through the vertex without intravenous contrast. COMPARISON:  CT head 11/07/2020 BRAIN: BRAIN Cerebral ventricle sizes are concordant with the degree of cerebral volume loss. Patchy and confluent areas of decreased attenuation are noted throughout the deep and periventricular white matter of the cerebral hemispheres bilaterally, compatible with chronic microvascular ischemic disease. No evidence of large-territorial acute infarction. No parenchymal hemorrhage. No mass lesion. No extra-axial collection. No mass effect or midline shift. No hydrocephalus. Basilar cisterns are patent. Vascular: No hyperdense vessel. Atherosclerotic calcifications are present within the cavernous internal carotid and vertebral arteries. Skull: No acute  fracture or focal lesion. Sinuses/Orbits: Paranasal sinuses and mastoid air cells are clear. Bilateral lens replacement. Otherwise the orbits are unremarkable. Other: None. IMPRESSION: No acute intracranial abnormality. Electronically Signed   By: Iven Finn M.D.   On: 11/09/2020 20:21   DG Chest Port 1 View  Result Date: 11/09/2020 CLINICAL DATA:  Fever EXAM: PORTABLE CHEST 1 VIEW COMPARISON:  11/07/2020 FINDINGS: Stable cardiomediastinal contours. Small left pleural effusion. No focal airspace consolidation. No pneumothorax. No acute bony findings. Degenerative changes of the bilateral shoulders. IMPRESSION: Small left pleural effusion. Electronically Signed   By: Davina Poke D.O.   On: 11/09/2020 18:27     Scheduled Meds:  atorvastatin  20 mg Oral Daily   budesonide  0.25 mg Nebulization BID   DULoxetine  60 mg Oral QHS   ezetimibe  10 mg Oral QHS   hydrocortisone sod succinate (SOLU-CORTEF) inj  50 mg Intravenous Q12H   insulin aspart  0-6 Units Subcutaneous Q4H   pantoprazole (PROTONIX) IV  40 mg Intravenous Q12H   vitamin B-12  2,500 mcg Oral QHS   Continuous Infusions:  sodium chloride Stopped (11/09/20 2306)     LOS: 0 days   Time spent: [place], MD [facility] To contact the attending provider between 7A-7P or the covering provider during after hours 7P-7A, please log into the web site [email or web address] and access using universal [place] password for that web site. If you do not have the password, please call the hospital operator.  11/10/2020, 7:24 AM

## 2020-11-10 NOTE — Progress Notes (Signed)
Lab called, pt recent HGB went up from 5.3 at 2200 to 7.9 at 0138. Pt have order for blood transfusion of 2 units PRBC. Verified transfusion order with provider on call J. Olena Heckle NP. Will hold transfusion for now till the next blood draw. Will continue to monitor pt.

## 2020-11-11 ENCOUNTER — Inpatient Hospital Stay (HOSPITAL_COMMUNITY): Payer: Medicare Other | Admitting: Certified Registered Nurse Anesthetist

## 2020-11-11 ENCOUNTER — Encounter (HOSPITAL_COMMUNITY)
Admission: EM | Disposition: A | Discharge status: Skilled Nursing Facility | Payer: Self-pay | Source: Home / Self Care | Attending: Family Medicine

## 2020-11-11 ENCOUNTER — Encounter (HOSPITAL_COMMUNITY): Payer: Self-pay | Admitting: Internal Medicine

## 2020-11-11 DIAGNOSIS — K862 Cyst of pancreas: Secondary | ICD-10-CM

## 2020-11-11 DIAGNOSIS — M069 Rheumatoid arthritis, unspecified: Secondary | ICD-10-CM

## 2020-11-11 DIAGNOSIS — E119 Type 2 diabetes mellitus without complications: Secondary | ICD-10-CM

## 2020-11-11 DIAGNOSIS — K922 Gastrointestinal hemorrhage, unspecified: Secondary | ICD-10-CM

## 2020-11-11 HISTORY — PX: ENTEROSCOPY: SHX5533

## 2020-11-11 LAB — CBC
HCT: 26.7 % — ABNORMAL LOW (ref 36.0–46.0)
Hemoglobin: 7.9 g/dL — ABNORMAL LOW (ref 12.0–15.0)
MCH: 22.4 pg — ABNORMAL LOW (ref 26.0–34.0)
MCHC: 29.6 g/dL — ABNORMAL LOW (ref 30.0–36.0)
MCV: 75.9 fL — ABNORMAL LOW (ref 80.0–100.0)
Platelets: 342 10*3/uL (ref 150–400)
RBC: 3.52 MIL/uL — ABNORMAL LOW (ref 3.87–5.11)
RDW: 18.8 % — ABNORMAL HIGH (ref 11.5–15.5)
WBC: 10.7 10*3/uL — ABNORMAL HIGH (ref 4.0–10.5)
nRBC: 0 % (ref 0.0–0.2)

## 2020-11-11 LAB — COMPREHENSIVE METABOLIC PANEL
ALT: 7 U/L (ref 0–44)
AST: 16 U/L (ref 15–41)
Albumin: 1.8 g/dL — ABNORMAL LOW (ref 3.5–5.0)
Alkaline Phosphatase: 80 U/L (ref 38–126)
Anion gap: 6 (ref 5–15)
BUN: 17 mg/dL (ref 8–23)
CO2: 22 mmol/L (ref 22–32)
Calcium: 8.4 mg/dL — ABNORMAL LOW (ref 8.9–10.3)
Chloride: 109 mmol/L (ref 98–111)
Creatinine, Ser: 1.06 mg/dL — ABNORMAL HIGH (ref 0.44–1.00)
GFR, Estimated: 53 mL/min — ABNORMAL LOW (ref >60)
Glucose, Bld: 222 mg/dL — ABNORMAL HIGH (ref 70–99)
Potassium: 3.9 mmol/L (ref 3.5–5.1)
Sodium: 137 mmol/L (ref 135–145)
Total Bilirubin: 0.1 mg/dL — ABNORMAL LOW (ref 0.3–1.2)
Total Protein: 4.8 g/dL — ABNORMAL LOW (ref 6.5–8.1)

## 2020-11-11 LAB — GLUCOSE, CAPILLARY
Glucose-Capillary: 186 mg/dL — ABNORMAL HIGH (ref 70–99)
Glucose-Capillary: 175 mg/dL — ABNORMAL HIGH (ref 70–99)
Glucose-Capillary: 129 mg/dL — ABNORMAL HIGH (ref 70–99)
Glucose-Capillary: 193 mg/dL — ABNORMAL HIGH (ref 70–99)
Glucose-Capillary: 201 mg/dL — ABNORMAL HIGH (ref 70–99)

## 2020-11-11 SURGERY — ENTEROSCOPY
Anesthesia: Monitor Anesthesia Care

## 2020-11-11 MED ORDER — PROPOFOL 10 MG/ML IV BOLUS
INTRAVENOUS | Status: DC | PRN
  Administered 2020-11-11: 20 mg via INTRAVENOUS

## 2020-11-11 MED ORDER — PROPOFOL 500 MG/50ML IV EMUL
INTRAVENOUS | Status: DC | PRN
  Administered 2020-11-11: 125 ug/kg/min via INTRAVENOUS

## 2020-11-11 MED ORDER — LIDOCAINE 2% (20 MG/ML) 5 ML SYRINGE
INTRAMUSCULAR | Status: DC | PRN
  Administered 2020-11-11: 40 mg via INTRAVENOUS

## 2020-11-11 MED ORDER — INSULIN ASPART 100 UNIT/ML IJ SOLN
0.0000 [IU] | Freq: Three times a day (TID) | INTRAMUSCULAR | Status: DC
  Administered 2020-11-12: 2 [IU] via SUBCUTANEOUS
  Administered 2020-11-13 – 2020-11-15 (×4): 1 [IU] via SUBCUTANEOUS

## 2020-11-11 MED ORDER — PREDNISONE 5 MG PO TABS
5.0000 mg | ORAL_TABLET | Freq: Every day | ORAL | Status: DC
  Administered 2020-11-11 – 2020-11-15 (×5): 5 mg via ORAL
  Filled 2020-11-11 (×5): qty 1

## 2020-11-11 MED ORDER — LACTATED RINGERS IV SOLN: INTRAVENOUS | Status: DC

## 2020-11-11 NOTE — Transfer of Care (Signed)
Immediate Anesthesia Transfer of Care Note  Patient: [name]  Procedure(s) Performed: ENTEROSCOPY  Patient Location: Endoscopy Unit  Anesthesia Type:MAC  Level of Consciousness: drowsy  Airway & Oxygen Therapy: Patient Spontanous Breathing  Post-op Assessment: Report given to RN and Post -op Vital signs reviewed and stable  Post vital signs: Reviewed and stable  Last Vitals:  Vitals Value Taken Time  BP 97/50 11/11/20 1113  Temp    Pulse 88 11/11/20 1114  Resp 19 11/11/20 1114  SpO2 94 % 11/11/20 1114  Vitals shown include unvalidated device data.  Last Pain:  Vitals:   11/11/20 0945  TempSrc: Oral  PainSc: 0-No pain         Complications: No notable events documented.

## 2020-11-11 NOTE — Anesthesia Preprocedure Evaluation (Signed)
Anesthesia Evaluation  Patient identified by MRN, date of birth, ID band Patient awake    Reviewed: Allergy & Precautions, NPO status , Patient's Chart, lab work & pertinent test results  History of Anesthesia Complications Negative for: history of anesthetic complications  Airway Mallampati: II  TM Distance: >3 FB Neck ROM: Full    Dental no notable dental hx. (+) Dental Advisory Given, Implants   Pulmonary sleep apnea , COPD, former smoker, PE   breath sounds clear to auscultation       Cardiovascular  Rhythm:Regular Rate:Normal     Neuro/Psych negative neurological ROS  negative psych ROS   GI/Hepatic Neg liver ROS, GERD  ,  Endo/Other  diabetes, Type 2  Renal/GU Renal diseaseLab Results      Component                Value               Date                      CREATININE               1.06 (H)            11/11/2020                Musculoskeletal  (+) Arthritis , Rheumatoid disorders,    Abdominal (+) + obese,   Peds  Hematology  (+) Blood dyscrasia, anemia , Lab Results      Component                Value               Date                      WBC                      10.7 (H)            11/11/2020                HGB                      7.9 (L)             11/11/2020                HCT                      26.7 (L)            11/11/2020                MCV                      75.9 (L)            11/11/2020                PLT                      342                 11/11/2020              Anesthesia Other Findings   Reproductive/Obstetrics  Anesthesia Physical Anesthesia Plan  ASA: 3  Anesthesia Plan: MAC   Post-op Pain Management:    Induction: Intravenous  PONV Risk Score and Plan: 2 and Propofol infusion and Treatment may vary due to age or medical condition  Airway Management Planned: Nasal Cannula  Additional Equipment:  None  Intra-op Plan:   Post-operative Plan:   Informed Consent: I have reviewed the patients History and Physical, chart, labs and discussed the procedure including the risks, benefits and alternatives for the proposed anesthesia with the patient or authorized representative who has indicated his/her understanding and acceptance.     Dental advisory given  Plan Discussed with: CRNA and Anesthesiologist  Anesthesia Plan Comments:         Anesthesia Quick Evaluation

## 2020-11-11 NOTE — Progress Notes (Signed)
No bloody stools noted overnight. Will monitor pt.

## 2020-11-11 NOTE — Interval H&P Note (Signed)
History and Physical Interval Note:  11/11/2020 10:16 AM  [name]  has presented today for surgery, with the diagnosis of Symptomatic anemia.  The various methods of treatment have been discussed with the patient and family. After consideration of risks, benefits and other options for treatment, the patient has consented to  Procedure(s): ENTEROSCOPY (N/A) as a surgical intervention.  The patient's history has been reviewed, patient examined, no change in status, stable for surgery.  I have reviewed the patient's chart and labs.  Questions were answered to the patient's satisfaction.     Landry Dyke

## 2020-11-11 NOTE — Op Note (Signed)
[facility] Patient Name: [name] Procedure Date : 11/11/2020 MRN: [id number] Attending MD: Arta Silence , MD Date of Birth: [birth year] CSN: [id number] Age: 80 Admit Type: Inpatient Procedure:                Small bowel enteroscopy Indications:              Anemia Providers:                Arta Silence, MD, Mariana Arn, Laverda Sorenson,                            Technician, Clearnce Sorrel, CRNA Referring MD:             [facility]. Medicines:                Monitored Anesthesia Care Complications:            No immediate complications. Estimated Blood Loss:     Estimated blood loss: none. Procedure:                Pre-Anesthesia Assessment:                           - Prior to the procedure, a History and Physical                            was performed, and patient medications and                            allergies were reviewed. The patient's tolerance of                            previous anesthesia was also reviewed. The risks                            and benefits of the procedure and the sedation                            options and risks were discussed with the patient.                            All questions were answered, and informed consent                            was obtained. Prior Anticoagulants: The patient has                            taken no previous anticoagulant or antiplatelet                            agents. ASA Grade Assessment: III - A patient with                            severe systemic disease. After reviewing the risks  and benefits, the patient was deemed in                            satisfactory condition to undergo the procedure.                           After obtaining informed consent, the endoscope was                            passed under direct vision. Throughout the                            procedure, the patient's blood pressure, pulse, and                            oxygen  saturations were monitored continuously. The                            [id number] Olympus endoscope was introduced                            through the mouth and advanced to the fourth part                            of duodenum. The small bowel enteroscopy was                            accomplished without difficulty. The patient                            tolerated the procedure well. Scope In: Scope Out: Findings:      The esophagus was normal.      The stomach was normal.      There was no evidence of significant pathology in the duodenal bulb, in       the first portion of the duodenum, in the second portion of the       duodenum, in the third portion of the duodenum and in the fourth portion       of the duodenum. Suspect I reached the junction of D4 and proximal       jejunum. No old/fresh blood seen. No AVMs seen. One lacteal noted. No       source of bleeding identified. Impression:               - Normal esophagus.                           - Normal stomach.                           - Normal duodenal bulb, first portion of the                            duodenum, second portion of the duodenum, third  portion of the duodenum and fourth portion of the                            duodenum. Suspect I reached D4/proximal jejunum                            junction. Recommendation:           - Return patient to hospital ward for ongoing care.                           - Full liquid diet today.                           - Continue present medications.                           - Stay off anticoagulants for the foreseeable                            future, unless absolutely needed.                           - Not convinced all of patient's anemia is due to                            GI source. Consider hematology consultation, prior                            to reconsideration of repeating same sets of GI                            tests.                            [facility] will follow. Procedure Code(s):        --- Professional ---                           [phone], Small intestinal endoscopy, enteroscopy                            beyond second portion of duodenum, not including                            ileum; diagnostic, including collection of                            specimen(s) by brushing or washing, when performed                            (separate procedure) Diagnosis Code(s):        --- Professional ---                           D64.9, Anemia, unspecified CPT copyright 2019 American Medical Association. All rights  reserved. The codes documented in this report are preliminary and upon coder review may  be revised to meet current compliance requirements. Arta Silence, MD 11/11/2020 11:22:01 AM This report has been signed electronically. Number of Addenda: 0

## 2020-11-11 NOTE — Progress Notes (Addendum)
PROGRESS NOTE    [name]  [id number] DOB: [birth year] DOA: 11/07/2020 PCP: [name], MD    Brief Narrative:  Mrs. Stenerson was admitted to the hospital with the working diagnosis of acute on chronic symptomatic anemia, suspected gastrointestnal bleeding.   80 year old female past medical history for pulmonary embolism, type 2 diabetes mellitus, rheumatoid arthritis and pancreatic cyst who presented after mechanical fall.  Apparently patient stayed laying on the floor for about 5 to 6 hours until her son found her and brought her to the emergency room.  She had a recent hospitalization and underwent GI work-up for gastrointestinal bleeding.  Capsule endoscopy showed small erosions in the small bowel, 1 small bowel AVM with stigmata of recent bleeding. On her initial physical examination she was febrile 100.4 F, blood pressure 93/60, heart rate 101, respiratory rate 25, oxygen saturation 97%, her lungs are clear to auscultation bilaterally, heart S1-S2, present, rhythmic, her abdomen was soft, no lower extremity edema.  White count 9.9, hemoglobin 5.3, hematocrit 19.3, platelets 296.Marland Kitchen  Urinalysis negative for infection, specific gravity 1.020.  Head CT negative for acute changes. Chest radiograph no infiltrates.  EKG 107 bpm, left axis deviation, normal intervals, sinus rhythm, PAC, poor R wave progression, no significant ST segment or T wave changes.  Patient received 2 packed red blood cell transfusions and underwent small bowel enteroscopy.  No signs of active bleeding.   Assessment & Plan:   Principal Problem:   Rectal bleeding Active Problems:   Rheumatoid arthritis ([place])   Chronic kidney disease (CKD), stage III (moderate) (HCC)   DM type 2, not at goal [facility])   Pancreatic cyst   Acute encephalopathy   Acute GI bleeding   Acute blood loss anemia, suspected GI bleeding (small bowel). Patient had endoscopic procedure this am, no abdominal pain, no nausea or vomiting,  no melena or hematochezia.  Follow up hgb and hct is 7,9/ 26,7 after 2 units PRBC. Small bowel enteroscopy with no acute bleeding.   Plan to continue close follow up on hgb and hct Transfuse if hgb less than 7.  Check iron panel, haptoglobin and LDH. Note her MCV is low and RDW is high.   2. Pulmonary embolism. Patient not longer on anticoagulation, due to bleeding.  Discontinue on 08/22.   3. T2DM/ dyslipidemia. Continue glucose cover and monitoring with insulin sliding scalae, patient now on soft diet.   Continue with atorvastatin and ezetimibe.   4. COPD. No clinical signs of exacerbation, continue with bronchodilator therapy.  5. RA. No active flare, continue with prednisone  5 mg at bedtime, no further need for stress dose steroids.   6. Depression. Continue with duloxetine.   Patient continue to be at high risk for worsening anemia   Status is: Inpatient  Remains inpatient appropriate because:Inpatient level of care appropriate due to severity of illness  Dispo: The patient is from: Home              Anticipated d/c is to: Home              Patient currently is not medically stable to d/c.   Difficult to place patient No   DVT prophylaxis: Scd   Code Status:    full  Family Communication:   No family at the bedside      Consultants:  GI   Procedures:  Small bowel enteroscopy     Subjective: Patient with no nausea or vomiting, no abdominal pain, continue to be very weak  and deconditioned. No dyspnea or chest pain.   Objective: Vitals:   11/11/20 0945 11/11/20 1113 11/11/20 1133 11/11/20 1143  BP: 128/64 (!) 97/50 (!) 109/55 118/61  Pulse: 91 60 88 85  Resp: 18 (!) '26 18 18  '$ Temp: 97.8 F (36.6 C) 97.7 F (36.5 C)    TempSrc: Oral Temporal    SpO2: 97% 90% 94% 95%  Weight:      Height:        Intake/Output Summary (Last 24 hours) at 11/11/2020 1318 Last data filed at 11/11/2020 1107 Gross per 24 hour  Intake 1550.85 ml  Output --  Net 1550.85 ml    Filed Weights   11/07/20 1812  Weight: 67.1 kg    Examination:   General: Not in pain or dyspnea  Neurology: Awake and alert, non focal  E ENT: mild pallor, no icterus, oral mucosa moist Cardiovascular: No JVD. S1-S2 present, rhythmic, no gallops, rubs, or murmurs. No lower extremity edema. Pulmonary: positive breath sounds bilaterally, with no wheezing, rhonchi or rales. Gastrointestinal. Abdomen soft and non tender Skin. No rashes Musculoskeletal: significant proximal and distal joint deformities in hands and feet.      Data Reviewed: I have personally reviewed following labs and imaging studies  CBC: Recent Labs  Lab 11/07/20 1829 11/08/20 1934 11/09/20 2200 11/10/20 0138 11/10/20 0440 11/10/20 0844 11/11/20 0101  WBC 11.2*   < > 9.9 9.2 8.0 7.6 10.7*  NEUTROABS 9.1*  --   --   --   --   --   --   HGB 9.2*   < > 5.3* 7.9* 8.3* 7.9* 7.9*  HCT 31.1*   < > 19.3* 27.3* 28.1* 27.0* 26.7*  MCV 75.9*   < > 79.4* 77.8* 75.5* 76.7* 75.9*  PLT 369   < > 296 297 305 310 342   < > = values in this interval not displayed.   Basic Metabolic Panel: Recent Labs  Lab 11/07/20 1829 11/10/20 0440 11/11/20 0101  NA 134* 137 137  K 3.7 4.2 3.9  CL 102 106 109  CO2 '22 22 22  '$ GLUCOSE 137* 169* 222*  BUN '12 12 17  '$ CREATININE 0.75 0.91 1.06*  CALCIUM 8.8* 8.8* 8.4*   GFR: Estimated Creatinine Clearance: 38.7 mL/min (A) (by C-G formula based on SCr of 1.06 mg/dL (H)). Liver Function Tests: Recent Labs  Lab 11/10/20 0440 11/11/20 0101  AST 13* 16  ALT 8 7  ALKPHOS 80 80  BILITOT 0.5 0.1*  PROT 4.7* 4.8*  ALBUMIN 1.7* 1.8*   No results for input(s): LIPASE, AMYLASE in the last 168 hours. No results for input(s): AMMONIA in the last 168 hours. Coagulation Profile: No results for input(s): INR, PROTIME in the last 168 hours. Cardiac Enzymes: Recent Labs  Lab 11/07/20 1829  CKTOTAL 33*   BNP (last 3 results) No results for input(s): PROBNP in the last 8760  hours. HbA1C: No results for input(s): HGBA1C in the last 72 hours. CBG: Recent Labs  Lab 11/10/20 1950 11/10/20 2326 11/11/20 0355 11/11/20 0748 11/11/20 1205  GLUCAP 193* 153* 186* 175* 129*   Lipid Profile: No results for input(s): CHOL, HDL, LDLCALC, TRIG, CHOLHDL, LDLDIRECT in the last 72 hours. Thyroid Function Tests: No results for input(s): TSH, T4TOTAL, FREET4, T3FREE, THYROIDAB in the last 72 hours. Anemia Panel: No results for input(s): VITAMINB12, FOLATE, FERRITIN, TIBC, IRON, RETICCTPCT in the last 72 hours.    Radiology Studies: I have reviewed all of the imaging during this hospital  visit personally     Scheduled Meds:  atorvastatin  20 mg Oral Daily   budesonide  0.25 mg Nebulization BID   DULoxetine  60 mg Oral QHS   ezetimibe  10 mg Oral QHS   hydrocortisone sod succinate (SOLU-CORTEF) inj  50 mg Intravenous Q12H   insulin aspart  0-6 Units Subcutaneous Q4H   pantoprazole (PROTONIX) IV  40 mg Intravenous Q12H   vitamin B-12  2,500 mcg Oral QHS   Continuous Infusions:  dextrose 5% lactated ringers 75 mL/hr at 11/11/20 0411   lactated ringers 10 mL/hr at 11/11/20 1001     LOS: 1 day        Anabela Crayton Gerome Apley, MD

## 2020-11-11 NOTE — Progress Notes (Signed)
Pt's Hgb at this time is 7.9, verified with MD on call if we need to resume BT. Per hospitalist on call, hold BT as pt's Vitals are within normal limits and not active bleeding is noted. Will monitor pt.

## 2020-11-12 LAB — CBC WITH DIFFERENTIAL/PLATELET
Abs Immature Granulocytes: 0.12 10*3/uL — ABNORMAL HIGH (ref 0.00–0.07)
Basophils Absolute: 0 10*3/uL (ref 0.0–0.1)
Basophils Relative: 0 %
Eosinophils Absolute: 0 10*3/uL (ref 0.0–0.5)
Eosinophils Relative: 0 %
HCT: 24.6 % — ABNORMAL LOW (ref 36.0–46.0)
Hemoglobin: 7.2 g/dL — ABNORMAL LOW (ref 12.0–15.0)
Immature Granulocytes: 2 %
Lymphocytes Relative: 17 %
Lymphs Abs: 1.3 10*3/uL (ref 0.7–4.0)
MCH: 22.2 pg — ABNORMAL LOW (ref 26.0–34.0)
MCHC: 29.3 g/dL — ABNORMAL LOW (ref 30.0–36.0)
MCV: 75.9 fL — ABNORMAL LOW (ref 80.0–100.0)
Monocytes Absolute: 0.5 10*3/uL (ref 0.1–1.0)
Monocytes Relative: 6 %
Neutro Abs: 5.9 10*3/uL (ref 1.7–7.7)
Neutrophils Relative %: 75 %
Platelets: 321 10*3/uL (ref 150–400)
RBC: 3.24 MIL/uL — ABNORMAL LOW (ref 3.87–5.11)
RDW: 18.8 % — ABNORMAL HIGH (ref 11.5–15.5)
WBC: 7.8 10*3/uL (ref 4.0–10.5)
nRBC: 0 % (ref 0.0–0.2)

## 2020-11-12 LAB — FERRITIN: Ferritin: 63 ng/mL (ref 11–307)

## 2020-11-12 LAB — IRON AND TIBC
Iron: 14 ug/dL — ABNORMAL LOW (ref 28–170)
Saturation Ratios: 7 % — ABNORMAL LOW (ref 10.4–31.8)
TIBC: 189 ug/dL — ABNORMAL LOW (ref 250–450)
UIBC: 175 ug/dL

## 2020-11-12 LAB — LACTATE DEHYDROGENASE: LDH: 145 U/L (ref 98–192)

## 2020-11-12 LAB — GLUCOSE, CAPILLARY
Glucose-Capillary: 134 mg/dL — ABNORMAL HIGH (ref 70–99)
Glucose-Capillary: 96 mg/dL (ref 70–99)

## 2020-11-12 MED ORDER — PANTOPRAZOLE SODIUM 40 MG PO TBEC
40.0000 mg | DELAYED_RELEASE_TABLET | Freq: Two times a day (BID) | ORAL | Status: DC
  Administered 2020-11-12 – 2020-11-16 (×8): 40 mg via ORAL
  Filled 2020-11-12 (×8): qty 1

## 2020-11-12 MED ORDER — SODIUM CHLORIDE 0.9 % IV SOLN
250.0000 mg | Freq: Every day | INTRAVENOUS | Status: AC
  Administered 2020-11-12: 250 mg via INTRAVENOUS
  Filled 2020-11-12: qty 20

## 2020-11-12 MED ORDER — POLYETHYLENE GLYCOL 3350 17 G PO PACK
17.0000 g | PACK | Freq: Two times a day (BID) | ORAL | Status: DC
  Filled 2020-11-12 (×5): qty 1

## 2020-11-12 NOTE — Plan of Care (Signed)
No source of bleeding seen.  She at this point appears to have acute on chronic anemia without overt blood loss.  She has undergone multiple endoscopic procedures without clear etiology discovered.  Would recommend hematology consultation for evaluation of ongoing anemia, now without overt GI bleeding.  Patient tells me one of her relatives has/had bone marrow problem (type?).  No further GI work-up anticipated.  Patient can continue on pantoprazole 40 mg po qd, stay off anticoagulation if feasible, and can follow-up with Dr. Alessandra Bevels in the next 3-4 weeks.  [facility] will sign-off; please call with questions; thank you for the consultation.

## 2020-11-12 NOTE — Progress Notes (Addendum)
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  Presented to emergency room 11/07/2020 with a fall from bed In Ed cleared for discharge home and was waiting to be sent home, became confused hypotensive started having rectal bleeding with a fever 100.4 hemoglobin dropped from 8--->5.3 patient was ordered 2 units PRBC  Underwent nonrevealing small bowel enteroscopy on 9/3  Hospital-Problem based course  Acute blood loss anemia History of rectal prolapse hemorrhoid Status post 2 units PRBC 9/1. Switch Protonix to p.o. twice daily- okayed for soft diet by GI Iron levels are low so we will give IV iron 9/4 May require further transfusion-await LDH haptoglobin to rule out hemolysis If hemoglobin below 7 will transfuse again  Per gastroenterology would start on MiraLAX and refer as an outpatient to surgery for prolapsed rectum Resume ferrous gluconate q. OD on discharge Prior pulmonary embolism-Eliquis d/c 10/30/20 CT last admit not revealing for PE--Hold further DOAC at this time Rh Arthritis on Prednisone as OP stress dosing Cortef 50 q 12 on admit and now back to prednisone 5 daily Fluid resuscitated in ED because of hypotension-now saline locked blood pressures are stable in the 130s to 140s DM  TY 2 Home med metformin 500 twice daily, 70/30 insulin 10 twice daily held at this time-- continue sensitive sliding scale until discharge CBGs 134-200 OSA not on BiPAP  COPD--not oxygen dependent priro to admit May continue albuterol 6 as needed Brovana 2 puffs twice daily Pulmicort 0.25 twice daily DuoNeb every 6 Steroids as above Obtain desat screen and discontinue oxygen if no needs Depression/anxiety Can continue Cymbalta 60 hs  DVT prophylaxis: SCD Code Status: Full code Family Communication: Called and D/W Pam daughter [phone] Disposition:  Status is: Inpatient  Remains inpatient appropriate because:Persistent severe electrolyte disturbances, Unsafe d/c plan, and IV treatments appropriate due to intensity of illness or inability to take PO  Dispo: The patient is from: Home              Anticipated d/c is to: SNF              Patient currently is not medically stable to d/c.   Difficult to place patient No  Consultants:  Gastroenterology  Procedures:   Antimicrobials:     Subjective:  Apparently was hallucinating which could have been post anesthesia yesterday She also is intolerant to some opiates and gets symptoms with this She has no chest pain no fever  No nausea no vomiting at this time tolerated breakfast  Objective: Vitals:   11/11/20 2020 11/11/20 2102 11/12/20 0450 11/12/20 0847  BP: 128/77  139/82 (!) 145/74  Pulse: 65  88 79  Resp: '17  18 16  '$ Temp: 98.3 F (36.8 C)  98.1 F (36.7 C) 97.8 F (36.6 C)  TempSrc: Oral  Oral Oral  SpO2: 94% 96% 97% 96%  Weight:  Height:        Intake/Output Summary (Last 24 hours) at 11/12/2020 1024 Last data filed at 11/12/2020 0431 Gross per 24 hour  Intake 370 ml  Output 300 ml  Net 70 ml    Filed Weights   11/07/20 1812  Weight: 67.1 kg    Examination:  Hard of hearing coherent no distress no icterus More conversant no hallucinations today Chest clear no added sound no rales no  rhonchi Abdomen soft no rebound no guarding rectal exam shows however some smooth muscle protruding out with straining ROM intact no focal deficit Neurologically intact   Data Reviewed: personally reviewed   CBC    Component Value Date/Time   WBC 7.8 11/12/2020 0208   RBC 3.24 (L) 11/12/2020 0208   HGB 7.2 (L) 11/12/2020 0208   HGB 13.3 08/30/2015 1649   HCT 24.6 (L) 11/12/2020 0208   HCT 41.2 08/30/2015 1649   PLT 321 11/12/2020 0208   PLT 379 08/30/2015 1649   MCV 75.9 (L) 11/12/2020 0208   MCV 83 08/30/2015 1649   MCH 22.2 (L) 11/12/2020 0208   MCHC 29.3 (L) 11/12/2020 0208   RDW 18.8 (H) 11/12/2020 0208   RDW 15.1 08/30/2015 1649   LYMPHSABS 1.3 11/12/2020 0208   LYMPHSABS 3.7 (H) 08/30/2015 1649   MONOABS 0.5 11/12/2020 0208   EOSABS 0.0 11/12/2020 0208   EOSABS 0.3 08/30/2015 1649   BASOSABS 0.0 11/12/2020 0208   BASOSABS 0.1 08/30/2015 1649   CMP Latest Ref Rng & Units 11/11/2020 11/10/2020 11/07/2020  Glucose 70 - 99 mg/dL 222(H) 169(H) 137(H)  BUN 8 - 23 mg/dL '17 12 12  '$ Creatinine 0.44 - 1.00 mg/dL 1.06(H) 0.91 0.75  Sodium 135 - 145 mmol/L 137 137 134(L)  Potassium 3.5 - 5.1 mmol/L 3.9 4.2 3.7  Chloride 98 - 111 mmol/L 109 106 102  CO2 22 - 32 mmol/L '22 22 22  '$ Calcium 8.9 - 10.3 mg/dL 8.4(L) 8.8(L) 8.8(L)  Total Protein 6.5 - 8.1 g/dL 4.8(L) 4.7(L) -  Total Bilirubin 0.3 - 1.2 mg/dL 0.1(L) 0.5 -  Alkaline Phos 38 - 126 U/L 80 80 -  AST 15 - 41 U/L 16 13(L) -  ALT 0 - 44 U/L 7 8 -     Radiology Studies: No results found.   Scheduled Meds:  atorvastatin  20 mg Oral Daily   budesonide  0.25 mg Nebulization BID   DULoxetine  60 mg Oral QHS   ezetimibe  10 mg Oral QHS   insulin aspart  0-6 Units Subcutaneous TID AC & HS   pantoprazole (PROTONIX) IV  40 mg Intravenous Q12H   predniSONE  5 mg Oral QHS   vitamin B-12  2,500 mcg Oral QHS   Continuous Infusions:  lactated ringers 10 mL/hr at 11/11/20 1001     LOS: 2 days   Time spent: [place], MD [facility] To contact the attending provider between 7A-7P or the covering provider during after hours 7P-7A, please log into the web site [email or web address] and access using universal [place] password for that web site. If you do not have the password, please call the hospital operator.  11/12/2020, 10:24 AM

## 2020-11-12 NOTE — Hospital Course (Signed)
Recommendation:           - Return patient to hospital ward for ongoing care.                           - Full liquid diet today.                           - Continue present medications.                           - Stay off anticoagulants for the foreseeable                            future, unless absolutely needed.                           - Not convinced all of patient's anemia is due to                            GI source. Consider hematology consultation, prior                            to reconsideration of repeating same sets of GI                            tests.                           [facility] will follow.

## 2020-11-13 ENCOUNTER — Encounter (HOSPITAL_COMMUNITY): Payer: Self-pay | Admitting: Gastroenterology

## 2020-11-13 DIAGNOSIS — K625 Hemorrhage of anus and rectum: Secondary | ICD-10-CM

## 2020-11-13 LAB — TYPE AND SCREEN
ABO/RH(D): B NEG
Antibody Screen: POSITIVE
Unit division: 0
Unit division: 0

## 2020-11-13 LAB — COMPREHENSIVE METABOLIC PANEL
ALT: 20 U/L (ref 0–44)
AST: 40 U/L (ref 15–41)
Albumin: 1.9 g/dL — ABNORMAL LOW (ref 3.5–5.0)
Alkaline Phosphatase: 94 U/L (ref 38–126)
Anion gap: 7 (ref 5–15)
BUN: 16 mg/dL (ref 8–23)
CO2: 22 mmol/L (ref 22–32)
Calcium: 8.3 mg/dL — ABNORMAL LOW (ref 8.9–10.3)
Chloride: 111 mmol/L (ref 98–111)
Creatinine, Ser: 0.87 mg/dL (ref 0.44–1.00)
GFR, Estimated: >60 mL/min (ref >60)
Glucose, Bld: 148 mg/dL — ABNORMAL HIGH (ref 70–99)
Potassium: 3.7 mmol/L (ref 3.5–5.1)
Sodium: 140 mmol/L (ref 135–145)
Total Bilirubin: 0.1 mg/dL — ABNORMAL LOW (ref 0.3–1.2)
Total Protein: 4.6 g/dL — ABNORMAL LOW (ref 6.5–8.1)

## 2020-11-13 LAB — RETICULOCYTES
Immature Retic Fract: 35 % — ABNORMAL HIGH (ref 2.3–15.9)
RBC.: 3.55 MIL/uL — ABNORMAL LOW (ref 3.87–5.11)
Retic Count, Absolute: 47.9 10*3/uL (ref 19.0–186.0)
Retic Ct Pct: 1.4 % (ref 0.4–3.1)

## 2020-11-13 LAB — GLUCOSE, CAPILLARY
Glucose-Capillary: 108 mg/dL — ABNORMAL HIGH (ref 70–99)
Glucose-Capillary: 110 mg/dL — ABNORMAL HIGH (ref 70–99)
Glucose-Capillary: 153 mg/dL — ABNORMAL HIGH (ref 70–99)
Glucose-Capillary: 153 mg/dL — ABNORMAL HIGH (ref 70–99)
Glucose-Capillary: 166 mg/dL — ABNORMAL HIGH (ref 70–99)
Glucose-Capillary: 97 mg/dL (ref 70–99)

## 2020-11-13 LAB — BPAM RBC
Blood Product Expiration Date: 202210032359
Blood Product Expiration Date: 202210042359
Unit Type and Rh: 1700
Unit Type and Rh: 1700

## 2020-11-13 LAB — CBC WITH DIFFERENTIAL/PLATELET
Abs Immature Granulocytes: 0.33 10*3/uL — ABNORMAL HIGH (ref 0.00–0.07)
Basophils Absolute: 0 10*3/uL (ref 0.0–0.1)
Basophils Relative: 0 %
Eosinophils Absolute: 0.2 10*3/uL (ref 0.0–0.5)
Eosinophils Relative: 2 %
HCT: 26.8 % — ABNORMAL LOW (ref 36.0–46.0)
Hemoglobin: 7.5 g/dL — ABNORMAL LOW (ref 12.0–15.0)
Immature Granulocytes: 3 %
Lymphocytes Relative: 13 %
Lymphs Abs: 1.2 10*3/uL (ref 0.7–4.0)
MCH: 21.7 pg — ABNORMAL LOW (ref 26.0–34.0)
MCHC: 28 g/dL — ABNORMAL LOW (ref 30.0–36.0)
MCV: 77.5 fL — ABNORMAL LOW (ref 80.0–100.0)
Monocytes Absolute: 0.8 10*3/uL (ref 0.1–1.0)
Monocytes Relative: 8 %
Neutro Abs: 7.2 10*3/uL (ref 1.7–7.7)
Neutrophils Relative %: 74 %
Platelets: 338 10*3/uL (ref 150–400)
RBC: 3.46 MIL/uL — ABNORMAL LOW (ref 3.87–5.11)
RDW: 19.1 % — ABNORMAL HIGH (ref 11.5–15.5)
WBC: 9.8 10*3/uL (ref 4.0–10.5)
nRBC: 0.3 % — ABNORMAL HIGH (ref 0.0–0.2)

## 2020-11-13 LAB — FOLATE: Folate: 6.8 ng/mL (ref >5.9)

## 2020-11-13 LAB — VITAMIN B12: Vitamin B-12: 2772 pg/mL — ABNORMAL HIGH (ref 180–914)

## 2020-11-13 MED ORDER — OLOPATADINE HCL 0.1 % OP SOLN
1.0000 [drp] | Freq: Three times a day (TID) | OPHTHALMIC | Status: DC
  Administered 2020-11-13 – 2020-11-15 (×6): 1 [drp] via OPHTHALMIC

## 2020-11-13 MED ORDER — OXYCODONE HCL 5 MG PO TABS
5.0000 mg | ORAL_TABLET | Freq: Four times a day (QID) | ORAL | Status: DC | PRN
  Administered 2020-11-13 – 2020-11-15 (×4): 5 mg via ORAL
  Filled 2020-11-13 (×4): qty 1

## 2020-11-13 NOTE — Consult Note (Signed)
[place]  [phone] Fax:(336) [place]    Referral MD  Reason for Referral: Anemia  Chief Complaint  Patient presents with   Fall    HPI:   This is a 80 year old female patient with past medical history significant for COPD, prior PE Eliquis discontinued in May 2022, type 2 diabetes, rheumatoid arthritis with some major joint deformity on chronic low-dose prednisone, obstructive sleep apnea, recently admitted with acute blood loss anemia.  She had capsule endoscopy which demonstrated small bowel AVM, small bowel erosions and Eliquis was discontinued during that hospital stay.  She read presented with a fall from bed on 11/07/2020 and was found to have a hemoglobin of 5.3 hence received another blood transfusion.   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   Biopsy showed hyperplastic polyps. Colonoscopy showed rectal prolapse, diverticulosis and small tubular adenoma in the descending colon. Capsule endoscopy on Oct 29, 2020 showed few small erosions in small bowel, 1 small AVM in the proximal duodenum with stigmata of recent bleeding. Because she was not thought to have any recent bleed which would explain her hemoglobin of 5.3 for this hospital admission, hematology was consulted to evaluate for any other underlying hematological process. She has had multiple ER visits and hospital admission recently for various complaints including PE, generalized weakness, blood loss anemia.  At baseline she has multiple medical comorbidities. She tells me that she feels very weak, has lost a bunch of weight in the past several months.  She notices occasional dark stool, takes iron once every other day and has been taking oral iron supplementation for the past 2 months.  She had a history of anemia for several years prior  to this. RA with some major deformity, uses prednisone 5 mg p.o. daily.  She lives independently and is hoping to go to some long-term care or rehab since she is unable to care for herself. Rest of the pertinent 10 point ROS reviewed and negative.  Past Medical History:  Diagnosis Date   Acute pulmonary embolism ([place])    b/l; dx'ed May '22   Asthma    Complication of anesthesia    Anesthesia told her years ago she got too cold during surgery   Diabetes mellitus without complication ([place])    Emphysema    Fibromyalgia    Osteoporosis    Rheumatoid arthritis(714.0)    Sleep apnea    no cpap  :   Past Surgical History:  Procedure Laterality Date   ABDOMINAL HYSTERECTOMY     ANKLE FRACTURE SURGERY  2007   BREAST SURGERY     mammoplasty and then removed   COLONOSCOPY WITH PROPOFOL N/A 08/22/2020   Procedure: COLONOSCOPY WITH PROPOFOL;  Surgeon: Ronnette Juniper, MD;  Location: [facility];  Service: Gastroenterology;  Laterality: N/A;  If schedule availability permits, would like to add on upper endoscopy as well   ESOPHAGOGASTRODUODENOSCOPY (EGD) WITH PROPOFOL N/A 08/22/2020   Procedure: ESOPHAGOGASTRODUODENOSCOPY (EGD) WITH PROPOFOL;  Surgeon: Ronnette Juniper, MD;  Location: [facility];  Service: Gastroenterology;  Laterality: N/A;   FOOT SURGERY     numerous   GIVENS CAPSULE STUDY N/A 10/28/2020   Procedure: GIVENS CAPSULE STUDY;  Surgeon: Otis Brace, MD;  Location: [facility];  Service: Gastroenterology;  Laterality: N/A;   HOT HEMOSTASIS N/A 08/22/2020   Procedure: HOT HEMOSTASIS (ARGON PLASMA  COAGULATION/BICAP);  Surgeon: Ronnette Juniper, MD;  Location: [facility];  Service: Gastroenterology;  Laterality: N/A;   I & D EXTREMITY Right 10/26/2014   Procedure: IRRIGATION AND DEBRIDEMENT RIGHT KNEE ;  Surgeon: Gaynelle Arabian, MD;  Location: [facility];  Service: Orthopedics;  Laterality: Right;   IRRIGATION AND DEBRIDEMENT KNEE Right 03/09/2017   Procedure: IRRIGATION AND DEBRIDEMENT RIGHT  KNEE PRE-PATELLAR BURSA;  Surgeon: Susa Day, MD;  Location: [place];  Service: Orthopedics;  Laterality: Right;   KNEE BURSECTOMY Right 09/16/2014   Procedure: EXCISON OF PRE PATELLA BURSA RIGHT KNEE ;  Surgeon: Gaynelle Arabian, MD;  Location: [facility];  Service: Orthopedics;  Laterality: Right;   PARTIAL HYSTERECTOMY  1970   POLYPECTOMY  08/22/2020   Procedure: POLYPECTOMY;  Surgeon: Ronnette Juniper, MD;  Location: [facility];  Service: Gastroenterology;;   TOTAL KNEE ARTHROPLASTY  2005 / 2006   x2  :   Current Facility-Administered Medications  Medication Dose Route Frequency Provider Last Rate Last Admin   acetaminophen (TYLENOL) tablet 650 mg  650 mg Oral Q6H PRN Arta Silence, MD   650 mg at 11/10/20 1700   Or   acetaminophen (TYLENOL) suppository 650 mg  650 mg Rectal Q6H PRN Arta Silence, MD       arformoterol (BROVANA) nebulizer solution 15 mcg  15 mcg Nebulization BID PRN Arta Silence, MD       atorvastatin (LIPITOR) tablet 20 mg  20 mg Oral Daily Arta Silence, MD   20 mg at 11/13/20 1044   budesonide (PULMICORT) nebulizer solution 0.25 mg  0.25 mg Nebulization BID Arta Silence, MD   0.25 mg at 11/13/20 0855   DULoxetine (CYMBALTA) DR capsule 60 mg  60 mg Oral Michell Heinrich, MD   60 mg at 11/12/20 2134   ezetimibe (ZETIA) tablet 10 mg  10 mg Oral Michell Heinrich, MD   10 mg at 11/12/20 2134   insulin aspart (novoLOG) injection 0-6 Units  0-6 Units Subcutaneous TID AC & HS Arrien, Jimmy Picket, MD   2 Units at 11/12/20 1300   ipratropium-albuterol (DUONEB) 0.5-2.5 (3) MG/3ML nebulizer solution 3 mL  3 mL Nebulization Q6H PRN Arta Silence, MD       lactated ringers infusion   Intravenous Continuous Arta Silence, MD 10 mL/hr at 11/11/20 1001 Restarted at 11/11/20 1053   olopatadine (PATANOL) 0.1 % ophthalmic solution 1 drop  1 drop Both Eyes BID PRN Arta Silence, MD   1 drop at 11/12/20 2205   pantoprazole (PROTONIX) EC tablet 40 mg  40 mg Oral BID  Nita Sells, MD   40 mg at 11/13/20 1044   polyethylene glycol (MIRALAX / GLYCOLAX) packet 17 g  17 g Oral BID Nita Sells, MD       predniSONE (DELTASONE) tablet 5 mg  5 mg Oral QHS Arrien, Jimmy Picket, MD   5 mg at 11/12/20 2134   vitamin B-12 (CYANOCOBALAMIN) tablet 2,500 mcg  2,500 mcg Oral Michell Heinrich, MD   2,500 mcg at 11/12/20 2134      Allergies  Allergen Reactions   Penicillins Hives    Has patient had a PCN reaction causing immediate rash, facial/tongue/throat swelling, SOB or lightheadedness with hypotension: Yes Has patient had a PCN reaction causing severe rash involving mucus membranes or skin necrosis: No Has patient had a PCN reaction that required hospitalization: No Has patient had a PCN reaction occurring within the last 10 years: No If all of the above answers are "NO", then may  proceed with Cephalosporin use. Patient has tolerated ceftriaxone in the recent past.   Sulfamethoxazole-Trimethoprim Nausea And Vomiting and Other (See Comments)    Stomach problems   Adalimumab Other (See Comments)    "Not effective"   Hydroxychloroquine Sulfate Other (See Comments)    "Plaquenil did not work after [employer]"   Meperidine Other (See Comments)    Cramping   Meperidine Hcl Other (See Comments)    Cramping    Methotrexate Nausea And Vomiting and Other (See Comments)    "Projectile vomiting"   Penicillin G Sodium Hives   Phentermine-Topiramate Nausea Only and Other (See Comments)    "Nausea and dizziness & on re-trial, mental side effects so really cannot take it   Sulfa Antibiotics Nausea And Vomiting and Other (See Comments)    Stomach problems/GI upset, also   Sulfonamide Derivatives Nausea And Vomiting and Other (See Comments)    "Stomach problems"  :   Family History  Problem Relation Age of Onset   Hypertension Other    Diabetes Other    Stroke Other    Diabetes Mother    Diabetes Father    Diabetes Sister    Diabetes Maternal  Aunt    Cancer Paternal Uncle    Cancer Paternal Grandmother    Breast cancer Paternal Grandmother   :   Social History   Socioeconomic History   Marital status: Divorced    Spouse name: Not on file   Number of children: Not on file   Years of education: Not on file   Highest education level: Not on file  Occupational History   Not on file  Tobacco Use   Smoking status: Former    Types: Cigarettes    Quit date: 03/11/1994    Years since quitting: 26.6   Smokeless tobacco: Never  Vaping Use   Vaping Use: Never used  Substance and Sexual Activity   Alcohol use: No   Drug use: No   Sexual activity: Not on file  Other Topics Concern   Not on file  Social History Narrative   Not on file   Social Determinants of Health   Financial Resource Strain: Not on file  Food Insecurity: No Food Insecurity   Worried About Charity fundraiser in the Last Year: Never true   Arboriculturist in the Last Year: Never true  Transportation Needs: Unmet Transportation Needs   Lack of Transportation (Medical): Yes   Lack of Transportation (Non-Medical): Yes  Physical Activity: Not on file  Stress: Not on file  Social Connections: Not on file  Intimate Partner Violence: Not on file   Exam: Patient Vitals for the past 24 hrs:  BP Temp Temp src Pulse Resp SpO2  11/13/20 0856 -- -- -- -- -- 92 %  11/13/20 0807 137/89 97.7 F (36.5 C) Oral 93 18 92 %  11/13/20 0312 (!) 146/73 97.9 F (36.6 C) Oral 95 18 94 %  11/13/20 0023 137/74 97.6 F (36.4 C) Oral 95 17 93 %  11/12/20 2117 125/77 (!) 97.5 F (36.4 C) Oral 92 18 94 %  11/12/20 1600 117/67 98.3 F (36.8 C) Oral 88 17 94 %   Physical Exam Constitutional:      Appearance: She is ill-appearing (pallor).  HENT:     Head: Normocephalic and atraumatic.  Cardiovascular:     Rate and Rhythm: Normal rate and regular rhythm.  Pulmonary:     Effort: Pulmonary effort is normal.     Breath  sounds: Normal breath sounds.     Comments: Ant  lung fields Abdominal:     General: Abdomen is flat. There is no distension.     Palpations: Abdomen is soft. There is no mass.  Musculoskeletal:        General: Swelling and deformity present.     Cervical back: Normal range of motion. No rigidity.  Lymphadenopathy:     Cervical: No cervical adenopathy.  Skin:    General: Skin is warm and dry.  Neurological:     Mental Status: She is alert.  Psychiatric:        Mood and Affect: Mood normal.      Lab Results  Component Value Date   WBC 9.8 11/13/2020   HGB 7.5 (L) 11/13/2020   HCT 26.8 (L) 11/13/2020   PLT 338 11/13/2020   GLUCOSE 148 (H) 11/13/2020   ALT 20 11/13/2020   AST 40 11/13/2020   NA 140 11/13/2020   K 3.7 11/13/2020   CL 111 11/13/2020   CREATININE 0.87 11/13/2020   BUN 16 11/13/2020   CO2 22 11/13/2020    DG Chest 1 View  Result Date: 11/07/2020 CLINICAL DATA:  Fall out of bed tonight. EXAM: CHEST  1 VIEW COMPARISON:  October 27, 2020. FINDINGS: Stable cardiomediastinal silhouette. Both lungs are clear. The visualized skeletal structures are unremarkable. IMPRESSION: No active disease. Electronically Signed   By: Marijo Conception M.D.   On: 11/07/2020 20:05   DG Pelvis 1-2 Views  Result Date: 11/07/2020 CLINICAL DATA:  Bilateral hip pain after fall out of bed tonight. EXAM: PELVIS - 1-2 VIEW COMPARISON:  None. FINDINGS: There is no evidence of pelvic fracture or diastasis. No pelvic bone lesions are seen. IMPRESSION: Negative. Electronically Signed   By: Marijo Conception M.D.   On: 11/07/2020 20:06   CT HEAD WO CONTRAST (5MM)  Result Date: 11/09/2020 CLINICAL DATA:  Mental status change.  Unknown cause EXAM: CT HEAD WITHOUT CONTRAST TECHNIQUE: Contiguous axial images were obtained from the base of the skull through the vertex without intravenous contrast. COMPARISON:  CT head 11/07/2020 BRAIN: BRAIN Cerebral ventricle sizes are concordant with the degree of cerebral volume loss. Patchy and confluent areas of  decreased attenuation are noted throughout the deep and periventricular white matter of the cerebral hemispheres bilaterally, compatible with chronic microvascular ischemic disease. No evidence of large-territorial acute infarction. No parenchymal hemorrhage. No mass lesion. No extra-axial collection. No mass effect or midline shift. No hydrocephalus. Basilar cisterns are patent. Vascular: No hyperdense vessel. Atherosclerotic calcifications are present within the cavernous internal carotid and vertebral arteries. Skull: No acute fracture or focal lesion. Sinuses/Orbits: Paranasal sinuses and mastoid air cells are clear. Bilateral lens replacement. Otherwise the orbits are unremarkable. Other: None. IMPRESSION: No acute intracranial abnormality. Electronically Signed   By: Iven Finn M.D.   On: 11/09/2020 20:21   CT HEAD WO CONTRAST (5MM)  Result Date: 11/07/2020 CLINICAL DATA:  Head injury after fall out of bed. EXAM: CT HEAD WITHOUT CONTRAST CT CERVICAL SPINE WITHOUT CONTRAST TECHNIQUE: Multidetector CT imaging of the head and cervical spine was performed following the standard protocol without intravenous contrast. Multiplanar CT image reconstructions of the cervical spine were also generated. COMPARISON:  Jul 24, 2020. FINDINGS: CT HEAD FINDINGS Brain: Mild chronic ischemic white matter disease is noted. No mass effect or midline shift is noted. Ventricular size is within normal limits. There is no evidence of mass lesion, hemorrhage or acute infarction. Vascular: No hyperdense vessel  or unexpected calcification. Skull: Normal. Negative for fracture or focal lesion. Sinuses/Orbits: No acute finding. Other: None. CT CERVICAL SPINE FINDINGS Alignment: Minimal grade 1 retrolisthesis of C2-3 is noted secondary to moderate degenerative disc disease at this level. Mild grade 1 anterolisthesis is noted at C4-5 and C5-6 and C6-7 secondary to posterior facet joint hypertrophy. Skull base and vertebrae: No acute  fracture. No primary bone lesion or focal pathologic process. Soft tissues and spinal canal: No prevertebral fluid or swelling. No visible canal hematoma. Disc levels: Moderate degenerative disc disease is noted at C4-5, C5-6 and C6-7. Upper chest: Negative. Other: None. IMPRESSION: No acute intracranial abnormality seen. Moderate multilevel degenerative disc disease. No acute abnormality seen in the cervical spine. Electronically Signed   By: Marijo Conception M.D.   On: 11/07/2020 20:15   CT Cervical Spine Wo Contrast  Result Date: 11/07/2020 CLINICAL DATA:  Head injury after fall out of bed. EXAM: CT HEAD WITHOUT CONTRAST CT CERVICAL SPINE WITHOUT CONTRAST TECHNIQUE: Multidetector CT imaging of the head and cervical spine was performed following the standard protocol without intravenous contrast. Multiplanar CT image reconstructions of the cervical spine were also generated. COMPARISON:  Jul 24, 2020. FINDINGS: CT HEAD FINDINGS Brain: Mild chronic ischemic white matter disease is noted. No mass effect or midline shift is noted. Ventricular size is within normal limits. There is no evidence of mass lesion, hemorrhage or acute infarction. Vascular: No hyperdense vessel or unexpected calcification. Skull: Normal. Negative for fracture or focal lesion. Sinuses/Orbits: No acute finding. Other: None. CT CERVICAL SPINE FINDINGS Alignment: Minimal grade 1 retrolisthesis of C2-3 is noted secondary to moderate degenerative disc disease at this level. Mild grade 1 anterolisthesis is noted at C4-5 and C5-6 and C6-7 secondary to posterior facet joint hypertrophy. Skull base and vertebrae: No acute fracture. No primary bone lesion or focal pathologic process. Soft tissues and spinal canal: No prevertebral fluid or swelling. No visible canal hematoma. Disc levels: Moderate degenerative disc disease is noted at C4-5, C5-6 and C6-7. Upper chest: Negative. Other: None. IMPRESSION: No acute intracranial abnormality seen. Moderate  multilevel degenerative disc disease. No acute abnormality seen in the cervical spine. Electronically Signed   By: Marijo Conception M.D.   On: 11/07/2020 20:15   CT Thoracic Spine Wo Contrast  Result Date: 11/07/2020 CLINICAL DATA:  Mid-back pain EXAM: CT THORACIC SPINE WITHOUT CONTRAST TECHNIQUE: Multidetector CT images of the thoracic were obtained using the standard protocol without intravenous contrast. COMPARISON:  None. FINDINGS: Alignment: No substantial sagittal subluxation in the thoracic spine. Mild S-shaped thoracic curvature. Vertebrae: Vertebral body heights are maintained. No evidence of acute fracture Paraspinal and other soft tissues: Partially imaged layering small bilateral pleural effusions with overlying opacities. Disc levels: Multilevel degenerative disc disease, greatest and moderate at T5-T6. IMPRESSION: 1. No evidence of acute fracture or traumatic malalignment. 2. Multilevel degenerative disc disease, greatest and moderate at T5-T6. 3. Partially imaged layering small bilateral pleural effusions with overlying opacities. Electronically Signed   By: Margaretha Sheffield M.D.   On: 11/07/2020 20:04   CT Lumbar Spine Wo Contrast  Result Date: 11/07/2020 CLINICAL DATA:  Low back pain, trauma EXAM: CT LUMBAR SPINE WITHOUT CONTRAST TECHNIQUE: Multidetector CT imaging of the lumbar spine was performed without intravenous contrast administration. Multiplanar CT image reconstructions were also generated. COMPARISON:  CT abdomen/pelvis 10/27/2020. FINDINGS: Segmentation: 5 lumbar type vertebral bodies. Alignment: Similar alignment. Slight retrolisthesis of L5-S1.Dextrocurvature, centered at L3-L4. Vertebrae: Vertebral body heights are maintained. No evidence of acute  fracture. Osteopenia. Paraspinal and other soft tissues: Aorto bi-iliac calcific atherosclerosis. Disc levels: Severe multilevel degenerative disc disease with disc height loss, endplate sclerosis vacuum disc phenomenon and posterior  disc/osteophyte complexes. Multilevel facet arthropathy. Resulting multilevel foraminal stenosis, greatest and likely severe on the right at L5-S1. Multilevel disc bulges, including right inferiorly dissecting paracentral disc protrusion at L1-L2 that narrows the right subarticular recess. IMPRESSION: 1. No evidence of acute fracture or traumatic malalignment. 2. Severe multilevel degenerative disease with multilevel disc bulges, including right inferiorly dissecting paracentral disc protrusion at L1-L2. Multilevel foraminal stenosis, greatest and likely severe on the right at L5-S1. MRI of the lumbar spine could further characterize the canal and foramina if clinically indicated. 3. Dextrocurvature, centered at L3-L4. Electronically Signed   By: Margaretha Sheffield M.D.   On: 11/07/2020 20:17   CT Abdomen Pelvis W Contrast  Result Date: 10/27/2020 CLINICAL DATA:  Generalized abdominal pain.  Vomiting.  Diarrhea. EXAM: CT ABDOMEN AND PELVIS WITH CONTRAST TECHNIQUE: Multidetector CT imaging of the abdomen and pelvis was performed using the standard protocol following bolus administration of intravenous contrast. CONTRAST:  58m OMNIPAQUE IOHEXOL 350 MG/ML SOLN COMPARISON:  10/10/2020 FINDINGS: Lower Chest: No acute findings. Mild bilateral lower lobe bronchiectasis and pleural-parenchymal scarring. Hepatobiliary: No hepatic masses identified. Gallbladder is unremarkable. No evidence of biliary ductal dilatation. Pancreas: 11 mm simple appearing cyst in the pancreatic neck remains stable. No evidence of pancreatic ductal dilatation or peripancreatic inflammatory changes. Spleen: Within normal limits in size. Splenic cysts remain stable, largest measuring 5 cm. Adrenals/Urinary Tract: A few tiny sub-cm renal cysts are again noted. No masses identified. No evidence of ureteral calculi or hydronephrosis. Unremarkable unopacified urinary bladder. Stomach/Bowel: No evidence of obstruction, inflammatory process or  abnormal fluid collections. Normal appendix visualized. Diverticulosis is seen mainly involving the sigmoid colon, however there is no evidence of diverticulitis. Vascular/Lymphatic: No pathologically enlarged lymph nodes. No acute vascular findings. Aortic atherosclerotic calcification noted. Reproductive: Prior hysterectomy noted. Adnexal regions are unremarkable in appearance. Other: Stable small bilateral inguinal hernias which contain only fat. Musculoskeletal: No suspicious bone lesions identified. Severe degenerative disc disease noted at L2-3, L3-4, and L4-5. IMPRESSION: No acute findings within the abdomen or pelvis. Colonic diverticulosis, without radiographic evidence of diverticulitis. Stable 11 mm simple appearing cyst in pancreatic neck. Recommend continued imaging follow-up in 2 years, preferably with abdomen MRI without and with contrast. This recommendation follows ACR consensus guidelines: Management of Incidental Pancreatic Cysts: A White Paper of the ACR Incidental Findings Committee. [id number] Aortic Atherosclerosis (ICD10-I70.0). Electronically Signed   By: JMarlaine HindM.D.   On: 10/27/2020 17:30   DG Chest Port 1 View  Result Date: 11/09/2020 CLINICAL DATA:  Fever EXAM: PORTABLE CHEST 1 VIEW COMPARISON:  11/07/2020 FINDINGS: Stable cardiomediastinal contours. Small left pleural effusion. No focal airspace consolidation. No pneumothorax. No acute bony findings. Degenerative changes of the bilateral shoulders. IMPRESSION: Small left pleural effusion. Electronically Signed   By: NDavina PokeD.O.   On: 11/09/2020 18:27   DG Chest Portable 1 View  Result Date: 10/27/2020 CLINICAL DATA:  Cough, weakness and generalized pain. Vomiting for 4 days. EXAM: PORTABLE CHEST 1 VIEW COMPARISON:  October 10, 2020. FINDINGS: EKG leads project over the chest. Cardiomediastinal contours are stable likely with mild cardiac enlargement. Central pulmonary vascular congestion is a  stable finding without signs of edema, consolidation or evidence of pleural effusion. No visible pneumothorax. On limited assessment no acute skeletal process. IMPRESSION: Stable mild cardiac enlargement  and central pulmonary vascular congestion without acute process. Electronically Signed   By: Zetta Bills M.D.   On: 10/27/2020 14:54   DG Knee Complete 4 Views Left  Result Date: 11/07/2020 CLINICAL DATA:  Trauma to the left knee. EXAM: LEFT KNEE - COMPLETE 4+ VIEW COMPARISON:  Left knee radiograph dated 06/02/2007 FINDINGS: There is a total left knee arthroplasty. The arthroplasty appear intact in anatomic alignment. No evidence of loosening. There is no acute fracture or dislocation. Old healed fracture of proximal fibula. Bones are osteopenic. Soft tissues are unremarkable. No joint effusion. IMPRESSION: 1. No acute fracture or dislocation. 2. Total left knee arthroplasty appears intact and in anatomic alignment. Electronically Signed   By: [name] M.D.   On: 11/07/2020 20:02   DG Knee Complete 4 Views Right  Result Date: 11/07/2020 CLINICAL DATA:  Bilateral knee pain after fall out of bed tonight. EXAM: RIGHT KNEE - COMPLETE 4+ VIEW COMPARISON:  March 09, 2017. FINDINGS: There has been interval revision of right hip arthroplasty with long stem femoral and tibial components. No acute fracture or dislocation is noted. Vascular calcifications are noted. IMPRESSION: Postsurgical changes as described above. No definite acute abnormality seen. Electronically Signed   By: Marijo Conception M.D.   On: 11/07/2020 20:00     DG Chest 1 View  Result Date: 11/07/2020 CLINICAL DATA:  Fall out of bed tonight. EXAM: CHEST  1 VIEW COMPARISON:  October 27, 2020. FINDINGS: Stable cardiomediastinal silhouette. Both lungs are clear. The visualized skeletal structures are unremarkable. IMPRESSION: No active disease. Electronically Signed   By: Marijo Conception M.D.   On: 11/07/2020 20:05   DG Pelvis 1-2  Views  Result Date: 11/07/2020 CLINICAL DATA:  Bilateral hip pain after fall out of bed tonight. EXAM: PELVIS - 1-2 VIEW COMPARISON:  None. FINDINGS: There is no evidence of pelvic fracture or diastasis. No pelvic bone lesions are seen. IMPRESSION: Negative. Electronically Signed   By: Marijo Conception M.D.   On: 11/07/2020 20:06   CT HEAD WO CONTRAST (5MM)  Result Date: 11/09/2020 CLINICAL DATA:  Mental status change.  Unknown cause EXAM: CT HEAD WITHOUT CONTRAST TECHNIQUE: Contiguous axial images were obtained from the base of the skull through the vertex without intravenous contrast. COMPARISON:  CT head 11/07/2020 BRAIN: BRAIN Cerebral ventricle sizes are concordant with the degree of cerebral volume loss. Patchy and confluent areas of decreased attenuation are noted throughout the deep and periventricular white matter of the cerebral hemispheres bilaterally, compatible with chronic microvascular ischemic disease. No evidence of large-territorial acute infarction. No parenchymal hemorrhage. No mass lesion. No extra-axial collection. No mass effect or midline shift. No hydrocephalus. Basilar cisterns are patent. Vascular: No hyperdense vessel. Atherosclerotic calcifications are present within the cavernous internal carotid and vertebral arteries. Skull: No acute fracture or focal lesion. Sinuses/Orbits: Paranasal sinuses and mastoid air cells are clear. Bilateral lens replacement. Otherwise the orbits are unremarkable. Other: None. IMPRESSION: No acute intracranial abnormality. Electronically Signed   By: Iven Finn M.D.   On: 11/09/2020 20:21   CT HEAD WO CONTRAST (5MM)  Result Date: 11/07/2020 CLINICAL DATA:  Head injury after fall out of bed. EXAM: CT HEAD WITHOUT CONTRAST CT CERVICAL SPINE WITHOUT CONTRAST TECHNIQUE: Multidetector CT imaging of the head and cervical spine was performed following the standard protocol without intravenous contrast. Multiplanar CT image reconstructions of the  cervical spine were also generated. COMPARISON:  Jul 24, 2020. FINDINGS: CT HEAD FINDINGS Brain: Mild chronic ischemic white  matter disease is noted. No mass effect or midline shift is noted. Ventricular size is within normal limits. There is no evidence of mass lesion, hemorrhage or acute infarction. Vascular: No hyperdense vessel or unexpected calcification. Skull: Normal. Negative for fracture or focal lesion. Sinuses/Orbits: No acute finding. Other: None. CT CERVICAL SPINE FINDINGS Alignment: Minimal grade 1 retrolisthesis of C2-3 is noted secondary to moderate degenerative disc disease at this level. Mild grade 1 anterolisthesis is noted at C4-5 and C5-6 and C6-7 secondary to posterior facet joint hypertrophy. Skull base and vertebrae: No acute fracture. No primary bone lesion or focal pathologic process. Soft tissues and spinal canal: No prevertebral fluid or swelling. No visible canal hematoma. Disc levels: Moderate degenerative disc disease is noted at C4-5, C5-6 and C6-7. Upper chest: Negative. Other: None. IMPRESSION: No acute intracranial abnormality seen. Moderate multilevel degenerative disc disease. No acute abnormality seen in the cervical spine. Electronically Signed   By: Marijo Conception M.D.   On: 11/07/2020 20:15   CT Cervical Spine Wo Contrast  Result Date: 11/07/2020 CLINICAL DATA:  Head injury after fall out of bed. EXAM: CT HEAD WITHOUT CONTRAST CT CERVICAL SPINE WITHOUT CONTRAST TECHNIQUE: Multidetector CT imaging of the head and cervical spine was performed following the standard protocol without intravenous contrast. Multiplanar CT image reconstructions of the cervical spine were also generated. COMPARISON:  Jul 24, 2020. FINDINGS: CT HEAD FINDINGS Brain: Mild chronic ischemic white matter disease is noted. No mass effect or midline shift is noted. Ventricular size is within normal limits. There is no evidence of mass lesion, hemorrhage or acute infarction. Vascular: No hyperdense  vessel or unexpected calcification. Skull: Normal. Negative for fracture or focal lesion. Sinuses/Orbits: No acute finding. Other: None. CT CERVICAL SPINE FINDINGS Alignment: Minimal grade 1 retrolisthesis of C2-3 is noted secondary to moderate degenerative disc disease at this level. Mild grade 1 anterolisthesis is noted at C4-5 and C5-6 and C6-7 secondary to posterior facet joint hypertrophy. Skull base and vertebrae: No acute fracture. No primary bone lesion or focal pathologic process. Soft tissues and spinal canal: No prevertebral fluid or swelling. No visible canal hematoma. Disc levels: Moderate degenerative disc disease is noted at C4-5, C5-6 and C6-7. Upper chest: Negative. Other: None. IMPRESSION: No acute intracranial abnormality seen. Moderate multilevel degenerative disc disease. No acute abnormality seen in the cervical spine. Electronically Signed   By: Marijo Conception M.D.   On: 11/07/2020 20:15   CT Thoracic Spine Wo Contrast  Result Date: 11/07/2020 CLINICAL DATA:  Mid-back pain EXAM: CT THORACIC SPINE WITHOUT CONTRAST TECHNIQUE: Multidetector CT images of the thoracic were obtained using the standard protocol without intravenous contrast. COMPARISON:  None. FINDINGS: Alignment: No substantial sagittal subluxation in the thoracic spine. Mild S-shaped thoracic curvature. Vertebrae: Vertebral body heights are maintained. No evidence of acute fracture Paraspinal and other soft tissues: Partially imaged layering small bilateral pleural effusions with overlying opacities. Disc levels: Multilevel degenerative disc disease, greatest and moderate at T5-T6. IMPRESSION: 1. No evidence of acute fracture or traumatic malalignment. 2. Multilevel degenerative disc disease, greatest and moderate at T5-T6. 3. Partially imaged layering small bilateral pleural effusions with overlying opacities. Electronically Signed   By: Margaretha Sheffield M.D.   On: 11/07/2020 20:04   CT Lumbar Spine Wo Contrast  Result  Date: 11/07/2020 CLINICAL DATA:  Low back pain, trauma EXAM: CT LUMBAR SPINE WITHOUT CONTRAST TECHNIQUE: Multidetector CT imaging of the lumbar spine was performed without intravenous contrast administration. Multiplanar CT image reconstructions were also  generated. COMPARISON:  CT abdomen/pelvis 10/27/2020. FINDINGS: Segmentation: 5 lumbar type vertebral bodies. Alignment: Similar alignment. Slight retrolisthesis of L5-S1.Dextrocurvature, centered at L3-L4. Vertebrae: Vertebral body heights are maintained. No evidence of acute fracture. Osteopenia. Paraspinal and other soft tissues: Aorto bi-iliac calcific atherosclerosis. Disc levels: Severe multilevel degenerative disc disease with disc height loss, endplate sclerosis vacuum disc phenomenon and posterior disc/osteophyte complexes. Multilevel facet arthropathy. Resulting multilevel foraminal stenosis, greatest and likely severe on the right at L5-S1. Multilevel disc bulges, including right inferiorly dissecting paracentral disc protrusion at L1-L2 that narrows the right subarticular recess. IMPRESSION: 1. No evidence of acute fracture or traumatic malalignment. 2. Severe multilevel degenerative disease with multilevel disc bulges, including right inferiorly dissecting paracentral disc protrusion at L1-L2. Multilevel foraminal stenosis, greatest and likely severe on the right at L5-S1. MRI of the lumbar spine could further characterize the canal and foramina if clinically indicated. 3. Dextrocurvature, centered at L3-L4. Electronically Signed   By: Margaretha Sheffield M.D.   On: 11/07/2020 20:17   CT Abdomen Pelvis W Contrast  Result Date: 10/27/2020 CLINICAL DATA:  Generalized abdominal pain.  Vomiting.  Diarrhea. EXAM: CT ABDOMEN AND PELVIS WITH CONTRAST TECHNIQUE: Multidetector CT imaging of the abdomen and pelvis was performed using the standard protocol following bolus administration of intravenous contrast. CONTRAST:  83m OMNIPAQUE IOHEXOL 350 MG/ML SOLN  COMPARISON:  10/10/2020 FINDINGS: Lower Chest: No acute findings. Mild bilateral lower lobe bronchiectasis and pleural-parenchymal scarring. Hepatobiliary: No hepatic masses identified. Gallbladder is unremarkable. No evidence of biliary ductal dilatation. Pancreas: 11 mm simple appearing cyst in the pancreatic neck remains stable. No evidence of pancreatic ductal dilatation or peripancreatic inflammatory changes. Spleen: Within normal limits in size. Splenic cysts remain stable, largest measuring 5 cm. Adrenals/Urinary Tract: A few tiny sub-cm renal cysts are again noted. No masses identified. No evidence of ureteral calculi or hydronephrosis. Unremarkable unopacified urinary bladder. Stomach/Bowel: No evidence of obstruction, inflammatory process or abnormal fluid collections. Normal appendix visualized. Diverticulosis is seen mainly involving the sigmoid colon, however there is no evidence of diverticulitis. Vascular/Lymphatic: No pathologically enlarged lymph nodes. No acute vascular findings. Aortic atherosclerotic calcification noted. Reproductive: Prior hysterectomy noted. Adnexal regions are unremarkable in appearance. Other: Stable small bilateral inguinal hernias which contain only fat. Musculoskeletal: No suspicious bone lesions identified. Severe degenerative disc disease noted at L2-3, L3-4, and L4-5. IMPRESSION: No acute findings within the abdomen or pelvis. Colonic diverticulosis, without radiographic evidence of diverticulitis. Stable 11 mm simple appearing cyst in pancreatic neck. Recommend continued imaging follow-up in 2 years, preferably with abdomen MRI without and with contrast. This recommendation follows ACR consensus guidelines: Management of Incidental Pancreatic Cysts: A White Paper of the ACR Incidental Findings Committee. [id number] Aortic Atherosclerosis (ICD10-I70.0). Electronically Signed   By: JMarlaine HindM.D.   On: 10/27/2020 17:30   DG Chest Port 1  View  Result Date: 11/09/2020 CLINICAL DATA:  Fever EXAM: PORTABLE CHEST 1 VIEW COMPARISON:  11/07/2020 FINDINGS: Stable cardiomediastinal contours. Small left pleural effusion. No focal airspace consolidation. No pneumothorax. No acute bony findings. Degenerative changes of the bilateral shoulders. IMPRESSION: Small left pleural effusion. Electronically Signed   By: NDavina PokeD.O.   On: 11/09/2020 18:27   DG Chest Portable 1 View  Result Date: 10/27/2020 CLINICAL DATA:  Cough, weakness and generalized pain. Vomiting for 4 days. EXAM: PORTABLE CHEST 1 VIEW COMPARISON:  October 10, 2020. FINDINGS: EKG leads project over the chest. Cardiomediastinal contours are stable likely with mild cardiac enlargement. Central  pulmonary vascular congestion is a stable finding without signs of edema, consolidation or evidence of pleural effusion. No visible pneumothorax. On limited assessment no acute skeletal process. IMPRESSION: Stable mild cardiac enlargement and central pulmonary vascular congestion without acute process. Electronically Signed   By: Zetta Bills M.D.   On: 10/27/2020 14:54   DG Knee Complete 4 Views Left  Result Date: 11/07/2020 CLINICAL DATA:  Trauma to the left knee. EXAM: LEFT KNEE - COMPLETE 4+ VIEW COMPARISON:  Left knee radiograph dated 06/02/2007 FINDINGS: There is a total left knee arthroplasty. The arthroplasty appear intact in anatomic alignment. No evidence of loosening. There is no acute fracture or dislocation. Old healed fracture of proximal fibula. Bones are osteopenic. Soft tissues are unremarkable. No joint effusion. IMPRESSION: 1. No acute fracture or dislocation. 2. Total left knee arthroplasty appears intact and in anatomic alignment. Electronically Signed   By: [name] M.D.   On: 11/07/2020 20:02   DG Knee Complete 4 Views Right  Result Date: 11/07/2020 CLINICAL DATA:  Bilateral knee pain after fall out of bed tonight. EXAM: RIGHT KNEE - COMPLETE 4+ VIEW  COMPARISON:  March 09, 2017. FINDINGS: There has been interval revision of right hip arthroplasty with long stem femoral and tibial components. No acute fracture or dislocation is noted. Vascular calcifications are noted. IMPRESSION: Postsurgical changes as described above. No definite acute abnormality seen. Electronically Signed   By: Marijo Conception M.D.   On: 11/07/2020 20:00    Assessment and Plan:   This is a pleasant 80 year old female patient with multiple medical comorbidities including rheumatoid arthritis, obstructive sleep apnea, type 2 diabetes, blood loss anemia with multiple hospital admissions, pulmonary embolism referred to hematology because of unexplained anemia and no clear evidence of GI bleed on this hospitalization.  She appears to have no evidence of hemolysis or no clear evidence of bone marrow failure.  Her white blood cell count and platelet count are completely normal.  LDH, total bilirubin, creatinine and liver enzymes are completely normal.  With major drop in hemoglobin, we only think of 2 major causes such as GI bleed versus hemolysis.  She has responded very well to blood transfusion and has retained her hemoglobin over 7 g/dL for the past 3 to 4 days.  She has some degree of microcytosis and hypochromia, I think she will benefit from continued oral iron supplementation at this time. I do not see an immediate indication for bone marrow aspiration and biopsy although it might be more practical for her to proceed with bone marrow aspiration and biopsy inpatient.  Another potential etiology for microcytic anemia in elderly besides iron deficiency sideroblastic anemia. Bone marrow biopsy typically shows ringed sideroblasts in this case.  Even if we were to find sideroblastic anemia or myelodysplastic syndrome, she is not a candidate for any chemotherapy.  She might still undergo periodic transfusions.  I will order a CT-guided bone marrow aspiration and biopsy inpatient and  will arrange for outpatient follow-up.  I have discussed the plan of care briefly with Dr. Verlon Au.  Other possible reason for her chronic anemia which is gradually worsening is anemia of chronic disease along with some iron deficiency.   I have reviewed labs over the past several years.  She has moderate anemia for several years with hemoglobin of about 9 to 10 g/dL, normocytic at times microcytic at times.  Most recently she was noted to have a hemoglobin of 5.3 and she required transfusion and her hemoglobin has stayed  over 7 g/dL for the last few days of the hospital admission.  There appears to be no evidence of hemolysis.  There appears to be no evidence of bone marrow failure necessarily, her white blood count and platelet count are completely normal.  No transaminitis.  Creatinine is normal.  Total bili is low and LDH is normal and consistent with hemolysis.  Ferritin of 63, possible mild iron deficiency. She had serum protein electrophoresis done in March 2020 with no evidence of monoclonal protein.  The length of time of the face-to-face encounter was 70 minutes. More than 50% of time was spent counseling and coordination of care.  Thank you for this referral.

## 2020-11-13 NOTE — Progress Notes (Signed)
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  Presented to emergency room 11/07/2020 with a fall from bed In Ed cleared for discharge home and was waiting to be sent home, became confused hypotensive started having rectal bleeding with a fever 100.4 hemoglobin dropped from 8--->5.3 patient was ordered 2 units PRBC  Underwent nonrevealing small bowel enteroscopy on 9/3  Hospital-Problem based course  Acute blood loss anemia History of rectal prolapse hemorrhoid Status post 2 units PRBC 9/1. Switch Protonix to p.o. twice daily- continue soft diet by GI gave IV iron 9/4 Per gastroenterology would start on MiraLAX and refer as an outpatient to surgery for prolapsed rectum Resume ferrous gluconate q. OD on discharge Discussed with hematology Dr. Eula Fried in unlikely her anemia 2/2 hemolysis--could be bleed superimposed on chr Iron def anemia--CT BM biopsy prior to d/c Prior pulmonary embolism-Eliquis d/c 10/30/20 CT last admit not revealing for PE--Hold further DOAC at this time Rh Arthritis on Prednisone as OP stress dosing Cortef 50 q 12 on admit and now back to prednisone 5 daily resuscitated in ED 2/2-- saline locked blood pressures are stable DM TY 2 Home med metformin 500 twice daily, 70/30  insulin 10 twice daily held at this time-- continue sensitive sliding scale until discharge CBGs 100-150 OSA not on BiPAP  COPD--not oxygen dependent priro to admit May continue albuterol 6 as needed Brovana 2 puffs twice daily Pulmicort 0.25 twice daily DuoNeb every 6 Steroids as above Obtain desat screen and discontinue oxygen if no needs Depression/anxiety Can continue Cymbalta 60 hs  DVT prophylaxis: SCD Code Status: Full code Family Communication: Called and D/W Pam daughter [phone] on 11/13/20 but no answer Disposition:  Status is: Inpatient  Remains inpatient appropriate because:Persistent severe electrolyte disturbances, Unsafe d/c plan, and IV treatments appropriate due to intensity of illness or inability to take PO  Dispo: The patient is from: Home              Anticipated d/c is to: SNF in the next 24 to 48 hours once biopsy is done              Patient currently is not medically stable to d/c.   Difficult to place patient No  Consultants:  Gastroenterology  Procedures:   Antimicrobials:     Subjective:   replaced her on her pain meds at her request although I do not see a prescription for oxycodone in the outpatient setting She has no distress she is eating ice cream She complains of loss of memory but is no longer hallucinating No fever no chills no chest pain No dark or tarry stool I have explained to her that MiraLAX will help her until we can get her to the  with follow-up with general surgery for her prolapse  Objective: Vitals:   11/13/20 0023 11/13/20 0312 11/13/20 0807 11/13/20 0856  BP: 137/74 Marland Kitchen)  146/73 137/89   Pulse: 95 95 93   Resp: '17 18 18   '$ Temp: 97.6 F (36.4 C) 97.9 F (36.6 C) 97.7 F (36.5 C)   TempSrc: Oral Oral Oral   SpO2: 93% 94% 92% 92%  Weight:      Height:        Intake/Output Summary (Last 24 hours) at 11/13/2020 1448 Last data filed at 11/13/2020 1443 Gross per 24 hour  Intake --  Output 1450 ml  Net -1450 ml     Filed Weights   11/07/20 1812  Weight: 67.1 kg    Examination:  No icterus no pallor looks stable EOMI NCAT no focal deficit CTA no rales no rhonchi Abdomen soft nontender no rebound no guarding Neurologically intact no focal deficit Significant deformities to upper extremity digits etc. Psych euthymic mood congruent   Data Reviewed: personally reviewed   CBC    Component Value Date/Time   WBC 9.8 11/13/2020 0032   RBC 3.46 (L) 11/13/2020 0032   HGB 7.5 (L) 11/13/2020 0032   HGB 13.3 08/30/2015 1649   HCT 26.8 (L) 11/13/2020 0032   HCT 41.2 08/30/2015 1649   PLT 338 11/13/2020 0032   PLT 379 08/30/2015 1649   MCV 77.5 (L) 11/13/2020 0032   MCV 83 08/30/2015 1649   MCH 21.7 (L) 11/13/2020 0032   MCHC 28.0 (L) 11/13/2020 0032   RDW 19.1 (H) 11/13/2020 0032   RDW 15.1 08/30/2015 1649   LYMPHSABS 1.2 11/13/2020 0032   LYMPHSABS 3.7 (H) 08/30/2015 1649   MONOABS 0.8 11/13/2020 0032   EOSABS 0.2 11/13/2020 0032   EOSABS 0.3 08/30/2015 1649   BASOSABS 0.0 11/13/2020 0032   BASOSABS 0.1 08/30/2015 1649   CMP Latest Ref Rng & Units 11/13/2020 11/11/2020 11/10/2020  Glucose 70 - 99 mg/dL 148(H) 222(H) 169(H)  BUN 8 - 23 mg/dL '16 17 12  '$ Creatinine 0.44 - 1.00 mg/dL 0.87 1.06(H) 0.91  Sodium 135 - 145 mmol/L 140 137 137  Potassium 3.5 - 5.1 mmol/L 3.7 3.9 4.2  Chloride 98 - 111 mmol/L 111 109 106  CO2 22 - 32 mmol/L '22 22 22  '$ Calcium 8.9 - 10.3 mg/dL 8.3(L) 8.4(L) 8.8(L)  Total Protein 6.5 - 8.1 g/dL 4.6(L) 4.8(L) 4.7(L)  Total Bilirubin 0.3 - 1.2 mg/dL 0.1(L) 0.1(L) 0.5  Alkaline Phos 38 - 126 U/L 94 80 80  AST 15 - 41 U/L 40 16 13(L)  ALT 0 - 44 U/L '20 7 8     '$ Radiology Studies: No results found.   Scheduled Meds:  atorvastatin  20 mg Oral Daily   budesonide  0.25 mg Nebulization BID   DULoxetine  60 mg Oral QHS   ezetimibe  10 mg Oral QHS   insulin aspart  0-6 Units Subcutaneous TID AC & HS   olopatadine  1 drop Both Eyes TID   pantoprazole  40 mg Oral  BID   polyethylene glycol  17 g Oral BID   predniSONE  5 mg Oral QHS   vitamin B-12  2,500 mcg Oral QHS   Continuous Infusions:  lactated ringers 10 mL/hr at 11/11/20 1001     LOS: 3 days   Time spent: 25  Nita Sells, MD [facility] To contact the attending provider between 7A-7P or the covering provider during after hours 7P-7A, please log into the web site [email or web address] and access using universal [place] password for that web site. If you do not have the password, please call the hospital operator.  11/13/2020, 2:48 PM

## 2020-11-13 NOTE — Progress Notes (Signed)
Physical Therapy Re-evaluation Patient Details Name: [name] MRN: [id number] DOB: 1941/01/08 Today's Date: 11/13/2020    History of Present Illness Pt is a 80yo female presenting to [facility] ED on 8/30 after a fall out of bed, laying on floor for 5+ hours. Imaging negative for acute findings.  Pt was going home on  PMH: recent hospital 8/19-22 for GI bleed,  PE 07/2020, asthma, DM, fibromyalgia, RA, sleep apnea.    PT Comments    Pt admitted with above diagnosis. Pt was able to pivot to the recliner with +2 max assist needing assist with weight shifting. Pt maintained flexed posture. Pt states she used to do this transfer on her own.  Pt is concerned about being able to care for herself at home and agrees with SNF. Performed Re-evaluation today as the MD had discharged order over weekend thinking pt was going home and then pt with worsening medical status.  Will continue toward goals. Pt currently with functional limitations due to balance and endruance deficits. Pt will benefit from skilled PT to increase their independence and safety with mobility to allow discharge to the venue listed below.      Follow Up Recommendations  SNF;Supervision/Assistance - 24 hour     Equipment Recommendations  Other (comment);Hospital bed (hoyer lift with pad)    Recommendations for Other Services       Precautions / Restrictions Precautions Precautions: Fall Precaution Comments: Recent fall, generalized weakness    Mobility  Bed Mobility Overal bed mobility: Needs Assistance Bed Mobility: Supine to Sit;Sit to Supine     Supine to sit: +2 for physical assistance;Mod assist Sit to supine: Mod assist;+2 for physical assistance   General bed mobility comments: Pt required mod assist +2 for physical assist for LE advancement off bed and trunk control to acheive upright sitting.    Transfers Overall transfer level: Needs assistance Equipment used: 2 person hand held assist Transfers: Stand Pivot  Transfers;Sit to/from Stand Sit to Stand: Max assist;+2 physical assistance;From elevated surface Stand pivot transfers: Max assist;+2 physical assistance;From elevated surface       General transfer comment: Sit to stand transfer with max assist +2 with pt able to take pivotal steps to chair with assist to weight shift pt. Pt with flexed posture and maintained hip and knee flexion with the transfer. Pt reports she is much weaker and that she used to pivot on her own.  Ambulation/Gait                 Stairs             Wheelchair Mobility    Modified Rankin (Stroke Patients Only)       Balance Overall balance assessment: Needs assistance Sitting-balance support: No upper extremity supported;Feet supported Sitting balance-Leahy Scale: Fair Sitting balance - Comments: can sit EOB on her own.   Standing balance support: Bilateral upper extremity supported;During functional activity Standing balance-Leahy Scale: Poor Standing balance comment: relies on UE support for balance                            Cognition Arousal/Alertness: Awake/alert Behavior During Therapy: Anxious Overall Cognitive Status: No family/caregiver present to determine baseline cognitive functioning                                 General Comments: Decreased safety awareness and awareness of deficits. Kept  stating "I need to rest".      Exercises General Exercises - Lower Extremity Ankle Circles/Pumps: AROM;Both;10 reps;Supine Long Arc Quad: AROM;Both;10 reps;Seated    General Comments        Pertinent Vitals/Pain Pain Location: bilateral knees Pain Descriptors / Indicators: Grimacing;Guarding;Moaning    Home Living                      Prior Function            PT Goals (current goals can now be found in the care plan section) Acute Rehab PT Goals Patient Stated Goal: I just want to rest and then go home. Progress towards PT goals:  Progressing toward goals    Frequency    Min 2X/week      PT Plan Current plan remains appropriate    Co-evaluation              AM-PAC PT "6 Clicks" Mobility   Outcome Measure  Help needed turning from your back to your side while in a flat bed without using bedrails?: A Lot Help needed moving from lying on your back to sitting on the side of a flat bed without using bedrails?: Total Help needed moving to and from a bed to a chair (including a wheelchair)?: Total Help needed standing up from a chair using your arms (e.g., wheelchair or bedside chair)?: Total Help needed to walk in hospital room?: Total Help needed climbing 3-5 steps with a railing? : Total 6 Click Score: 7    End of Session Equipment Utilized During Treatment: Gait belt Activity Tolerance: Patient limited by pain;Patient limited by fatigue Patient left: with call bell/phone within reach;in chair;with chair alarm set Nurse Communication: Mobility status PT Visit Diagnosis: Unsteadiness on feet (R26.81);Muscle weakness (generalized) (M62.81);History of falling (Z91.81);Difficulty in walking, not elsewhere classified (R26.2)     Time: [id number] PT Time Calculation (min) (ACUTE ONLY): 18 min  Charges:  $Therapeutic Activity: 8-22 mins                     [name] M,PT Acute Rehab Services [phone] (pager)    [name] 11/13/2020, 3:39 PM

## 2020-11-13 NOTE — Anesthesia Postprocedure Evaluation (Signed)
Anesthesia Post Note  Patient: [name]  Procedure(s) Performed: ENTEROSCOPY     Patient location during evaluation: Endoscopy Anesthesia Type: MAC Level of consciousness: awake and alert Pain management: pain level controlled Vital Signs Assessment: post-procedure vital signs reviewed and stable Respiratory status: spontaneous breathing, nonlabored ventilation, respiratory function stable and patient connected to nasal cannula oxygen Cardiovascular status: stable and blood pressure returned to baseline Postop Assessment: no apparent nausea or vomiting Anesthetic complications: no   No notable events documented.  Last Vitals:  Vitals:   11/13/20 0807 11/13/20 0856  BP: 137/89   Pulse: 93   Resp: 18   Temp: 36.5 C   SpO2: 92% 92%    Last Pain:  Vitals:   11/13/20 0838  TempSrc:   PainSc: 0-No pain                 Maya Scholer

## 2020-11-13 NOTE — TOC Progression Note (Signed)
Transition of Care [facility]) - Progression Note    Patient Details  Name: GEORGEANA OERTEL MRN: [id number] Date of Birth: 1940/07/23  Transition [facility]) CM/SW [place], [place] Phone Number: 11/13/2020, 12:33 PM  Clinical Narrative:   CSW met with patient to discuss bed offers, and patient expressed that she really wants to get into [facility] in [place]. Patient discussed how she has tried on her last few admissions and they have been unable to admit her and she is unsure why. Referral had not been sent to [facility] yet, CSW sent referral and asked them to review. CSW to follow.    Expected Discharge Plan: [place] Barriers to Discharge: Continued Medical Work up  Expected Discharge Plan and Services Expected Discharge Plan: [place] arrangements for the past 2 months: Single Family Home                                       Social Determinants of Health (SDOH) Interventions    Readmission Risk Interventions No flowsheet data found.

## 2020-11-14 ENCOUNTER — Telehealth: Payer: Self-pay | Admitting: Hematology and Oncology

## 2020-11-14 ENCOUNTER — Encounter (HOSPITAL_COMMUNITY): Payer: Self-pay | Admitting: Internal Medicine

## 2020-11-14 LAB — CBC WITH DIFFERENTIAL/PLATELET
Abs Immature Granulocytes: 0.44 10*3/uL — ABNORMAL HIGH (ref 0.00–0.07)
Basophils Absolute: 0.1 10*3/uL (ref 0.0–0.1)
Basophils Relative: 1 %
Eosinophils Absolute: 0.3 10*3/uL (ref 0.0–0.5)
Eosinophils Relative: 3 %
HCT: 28.6 % — ABNORMAL LOW (ref 36.0–46.0)
Hemoglobin: 8.2 g/dL — ABNORMAL LOW (ref 12.0–15.0)
Immature Granulocytes: 4 %
Lymphocytes Relative: 13 %
Lymphs Abs: 1.3 10*3/uL (ref 0.7–4.0)
MCH: 21.9 pg — ABNORMAL LOW (ref 26.0–34.0)
MCHC: 28.7 g/dL — ABNORMAL LOW (ref 30.0–36.0)
MCV: 76.5 fL — ABNORMAL LOW (ref 80.0–100.0)
Monocytes Absolute: 0.7 10*3/uL (ref 0.1–1.0)
Monocytes Relative: 7 %
Neutro Abs: 7.3 10*3/uL (ref 1.7–7.7)
Neutrophils Relative %: 72 %
Platelets: 354 10*3/uL (ref 150–400)
RBC: 3.74 MIL/uL — ABNORMAL LOW (ref 3.87–5.11)
RDW: 19.3 % — ABNORMAL HIGH (ref 11.5–15.5)
WBC: 10.1 10*3/uL (ref 4.0–10.5)
nRBC: 0.4 % — ABNORMAL HIGH (ref 0.0–0.2)

## 2020-11-14 LAB — GLUCOSE, CAPILLARY
Glucose-Capillary: 112 mg/dL — ABNORMAL HIGH (ref 70–99)
Glucose-Capillary: 131 mg/dL — ABNORMAL HIGH (ref 70–99)
Glucose-Capillary: 132 mg/dL — ABNORMAL HIGH (ref 70–99)
Glucose-Capillary: 175 mg/dL — ABNORMAL HIGH (ref 70–99)

## 2020-11-14 LAB — CULTURE, BLOOD (ROUTINE X 2)
Culture: NO GROWTH
Culture: NO GROWTH
Special Requests: ADEQUATE
Special Requests: ADEQUATE

## 2020-11-14 LAB — HAPTOGLOBIN: Haptoglobin: 313 mg/dL (ref 42–346)

## 2020-11-14 MED ORDER — OXYCODONE HCL 5 MG PO TABS: 5.0000 mg | ORAL_TABLET | Freq: Four times a day (QID) | ORAL | 0 refills | Status: DC | PRN

## 2020-11-14 NOTE — Consult Note (Signed)
Chief Complaint: Microcytic anemia   Supervising Physician: Corrie Mckusick  Patient Status: [facility] - In-pt  History of Present Illness: [name] is a 80 y.o. female was seen in consultation today for bone marrow biopsy in the setting of anemia. Pt was admitted with acute blood loss.  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  Eliquis has been discontinued during this admission.  Has been taking iron supplements at home due to anemia.  At admission Hgb was 5.3.   Request for procedure by Dr. Lonell Face  Past Medical History:  Diagnosis Date   Acute pulmonary embolism ([place])    b/l; dx'ed May '22   Asthma    Complication of anesthesia    Anesthesia told her years ago she got too cold during surgery   Diabetes mellitus without complication ([place])    Emphysema    Fibromyalgia    Osteoporosis    Rheumatoid arthritis(714.0)    Sleep apnea    no cpap    Past Surgical History:  Procedure Laterality Date   ABDOMINAL HYSTERECTOMY     ANKLE FRACTURE SURGERY  2007   BREAST SURGERY     mammoplasty and then removed   COLONOSCOPY WITH PROPOFOL N/A 08/22/2020   Procedure: COLONOSCOPY WITH PROPOFOL;  Surgeon: Ronnette Juniper, MD;  Location: [facility] ENDOSCOPY;  Service: Gastroenterology;  Laterality: N/A;  If schedule availability permits, would like to add on upper endoscopy as well   ENTEROSCOPY N/A 11/11/2020   Procedure: ENTEROSCOPY;  Surgeon: Arta Silence, MD;  Location: [facility] ENDOSCOPY;  Service: Endoscopy;  Laterality: N/A;   ESOPHAGOGASTRODUODENOSCOPY (EGD) WITH PROPOFOL N/A 08/22/2020   Procedure: ESOPHAGOGASTRODUODENOSCOPY (EGD) WITH PROPOFOL;  Surgeon: Ronnette Juniper, MD;  Location: [facility] ENDOSCOPY;  Service: Gastroenterology;  Laterality: N/A;   FOOT SURGERY     numerous   GIVENS CAPSULE STUDY N/A 10/28/2020   Procedure: GIVENS CAPSULE STUDY;  Surgeon: Otis Brace, MD;  Location: [facility]OS[facility];  Service: Gastroenterology;  Laterality: N/A;   HOT HEMOSTASIS N/A 08/22/2020    Procedure: HOT HEMOSTASIS (ARGON PLASMA COAGULATION/BICAP);  Surgeon: Ronnette Juniper, MD;  Location: [facility];  Service: Gastroenterology;  Laterality: N/A;   I & D EXTREMITY Right 10/26/2014   Procedure: IRRIGATION AND DEBRIDEMENT RIGHT KNEE ;  Surgeon: Gaynelle Arabian, MD;  Location: [facility];  Service: Orthopedics;  Laterality: Right;   IRRIGATION AND DEBRIDEMENT KNEE Right 03/09/2017   Procedure: IRRIGATION AND DEBRIDEMENT RIGHT KNEE PRE-PATELLAR BURSA;  Surgeon: Susa Day, MD;  Location: [facility];  Service: Orthopedics;  Laterality: Right;   KNEE BURSECTOMY Right 09/16/2014   Procedure: EXCISON OF PRE PATELLA BURSA RIGHT KNEE ;  Surgeon: Gaynelle Arabian, MD;  Location: [facility];  Service: Orthopedics;  Laterality: Right;   PARTIAL HYSTERECTOMY  1970   POLYPECTOMY  08/22/2020   Procedure: POLYPECTOMY;  Surgeon: Ronnette Juniper, MD;  Location: [facility];  Service: Gastroenterology;;   TOTAL KNEE ARTHROPLASTY  2005 / 2006   x2    Allergies: Penicillins, Sulfamethoxazole-trimethoprim, Adalimumab, Hydroxychloroquine sulfate, Meperidine, Meperidine hcl, Methotrexate, Penicillin g sodium, Phentermine-topiramate, Sulfa antibiotics, and Sulfonamide derivatives  Medications: Prior to Admission medications   Medication Sig Start Date End Date Taking? Authorizing Provider  acetaminophen (TYLENOL) 500 MG tablet Take 1,000 mg by mouth every 6 (six) hours as needed for mild pain (or headaches).   Yes [provider]  albuterol (VENTOLIN HFA) 108 (90 Base) MCG/ACT inhaler Inhale 2 puffs into the lungs every 6 (six) hours as needed for shortness of breath or wheezing. 07/05/19  Yes [provider]  arformoterol (BROVANA) 15 MCG/2ML NEBU Take 15 mcg by nebulization 2 (two) times daily as needed (for shortness of breath).   Yes [provider]  atorvastatin (LIPITOR) 20 MG tablet Take 20 mg by mouth daily. 10/23/20  Yes [provider]  budesonide (PULMICORT) 0.25 MG/2ML nebulizer  solution Take 2 mLs (0.25 mg total) by nebulization 2 (two) times daily. 05/26/20 10/27/21 Yes [name], MD  Cyanocobalamin (B-12) 2500 MCG TABS Take 2,500 mcg by mouth at bedtime.    Yes [provider]  DULoxetine (CYMBALTA) 60 MG capsule Take 60 mg by mouth at bedtime.    Yes [provider]  ezetimibe (ZETIA) 10 MG tablet Take 10 mg by mouth at bedtime.  11/04/16  Yes [provider]  ferrous gluconate (FERGON) 324 MG tablet Take 324 mg by mouth every other day.   Yes [provider]  ipratropium-albuterol (DUONEB) 0.5-2.5 (3) MG/3ML SOLN Take 3 mLs by nebulization every 6 (six) hours as needed (wheezing). 07/19/20  Yes [provider]  metFORMIN (GLUCOPHAGE) 500 MG tablet Take 500 mg by mouth 2 (two) times daily. 10/25/20  Yes [provider]  NOVOLIN 70/30 KWIKPEN (70-30) 100 UNIT/ML KwikPen Inject 10 Units into the skin 2 (two) times daily. 10/20/20  Yes [provider]  olopatadine (PATANOL) 0.1 % ophthalmic solution Place 1 drop into both eyes 2 (two) times daily. Patient taking differently: Place 1 drop into both eyes 2 (two) times daily as needed for allergies. 10/30/20  Yes Samuella Cota, MD  ondansetron (ZOFRAN-ODT) 8 MG disintegrating tablet Take 8 mg by mouth every 8 (eight) hours as needed for nausea or vomiting (dissolve orally). 10/24/20  Yes [provider]  pantoprazole (PROTONIX) 40 MG tablet Take one tablet once every morning Patient taking differently: Take 40 mg by mouth daily before breakfast. 05/26/20  Yes Kc, Ramesh, MD  tolterodine (DETROL LA) 2 MG 24 hr capsule Take 2 mg by mouth every evening. 08/15/20  Yes [provider]  B-D UF III MINI PEN NEEDLES 31G X 5 MM MISC SMARTSIG:1 Each SUB-Q 4 Times Daily 10/22/19   [provider]  oxyCODONE (OXY IR/ROXICODONE) 5 MG immediate release tablet Take 1 tablet (5 mg total) by mouth every 6 (six) hours as needed for up to 20 doses for severe pain.  11/14/20   Nita Sells, MD  predniSONE (DELTASONE) 5 MG tablet Take 5 mg by mouth at bedtime.    [provider]  MYRBETRIQ 50 MG TB24 tablet Take 50 mg by mouth at bedtime. Patient not taking: Reported on 08/19/2020 03/29/20 08/19/20  [provider]     Family History  Problem Relation Age of Onset   Hypertension Other    Diabetes Other    Stroke Other    Diabetes Mother    Diabetes Father    Diabetes Sister    Diabetes Maternal Aunt    Cancer Paternal Uncle    Cancer Paternal Grandmother    Breast cancer Paternal Grandmother     Social History   Socioeconomic History   Marital status: Divorced    Spouse name: Not on file   Number of children: Not on file   Years of education: Not on file   Highest education level: Not on file  Occupational History   Not on file  Tobacco Use   Smoking status: Former    Types: Cigarettes    Quit date: 03/11/1994    Years since quitting:  26.6   Smokeless tobacco: Never  Vaping Use   Vaping Use: Never used  Substance and Sexual Activity   Alcohol use: No   Drug use: No   Sexual activity: Not on file  Other Topics Concern   Not on file  Social History Narrative   Not on file   Social Determinants of Health   Financial Resource Strain: Not on file  Food Insecurity: No Food Insecurity   Worried About Running Out of Food in the Last Year: Never true   Ran Out of Food in the Last Year: Never true  Transportation Needs: Unmet Transportation Needs   Lack of Transportation (Medical): Yes   Lack of Transportation (Non-Medical): Yes  Physical Activity: Not on file  Stress: Not on file  Social Connections: Not on file    Review of Systems: A 12 point ROS discussed and pertinent positives are indicated in the HPI above.  All other systems are negative.  Review of Systems  Constitutional:  Positive for activity change and unexpected weight change.  Gastrointestinal:  Positive for blood in stool.  Neurological:   Positive for weakness.  Psychiatric/Behavioral:  Negative for behavioral problems and confusion.    Vital Signs: BP (!) 152/101 (BP Location: Right Arm)   Pulse 88   Temp 98.8 F (37.1 C) (Oral)   Resp 17   Ht 5' 5"  (1.651 m)   Wt 147 lb 14.9 oz (67.1 kg)   SpO2 94%   BMI 24.62 kg/m   Physical Exam HENT:     Head: Normocephalic and atraumatic.     Mouth/Throat:     Mouth: Mucous membranes are dry.  Cardiovascular:     Rate and Rhythm: Normal rate and regular rhythm.  Pulmonary:     Effort: Pulmonary effort is normal.     Breath sounds: Normal breath sounds.  Abdominal:     Palpations: Abdomen is soft.  Skin:    General: Skin is warm and dry.     Coloration: Skin is pale.  Neurological:     General: No focal deficit present.     Mental Status: She is alert and oriented to person, place, and time.  Psychiatric:        Behavior: Behavior normal.    Imaging: DG Chest 1 View  Result Date: 11/07/2020 CLINICAL DATA:  Fall out of bed tonight. EXAM: CHEST  1 VIEW COMPARISON:  October 27, 2020. FINDINGS: Stable cardiomediastinal silhouette. Both lungs are clear. The visualized skeletal structures are unremarkable. IMPRESSION: No active disease. Electronically Signed   By: Marijo Conception M.D.   On: 11/07/2020 20:05   DG Pelvis 1-2 Views  Result Date: 11/07/2020 CLINICAL DATA:  Bilateral hip pain after fall out of bed tonight. EXAM: PELVIS - 1-2 VIEW COMPARISON:  None. FINDINGS: There is no evidence of pelvic fracture or diastasis. No pelvic bone lesions are seen. IMPRESSION: Negative. Electronically Signed   By: Marijo Conception M.D.   On: 11/07/2020 20:06   CT HEAD WO CONTRAST (5MM)  Result Date: 11/09/2020 CLINICAL DATA:  Mental status change.  Unknown cause EXAM: CT HEAD WITHOUT CONTRAST TECHNIQUE: Contiguous axial images were obtained from the base of the skull through the vertex without intravenous contrast. COMPARISON:  CT head 11/07/2020 BRAIN: BRAIN Cerebral ventricle sizes  are concordant with the degree of cerebral volume loss. Patchy and confluent areas of decreased attenuation are noted throughout the deep and periventricular white matter of the cerebral hemispheres bilaterally, compatible with chronic microvascular ischemic  disease. No evidence of large-territorial acute infarction. No parenchymal hemorrhage. No mass lesion. No extra-axial collection. No mass effect or midline shift. No hydrocephalus. Basilar cisterns are patent. Vascular: No hyperdense vessel. Atherosclerotic calcifications are present within the cavernous internal carotid and vertebral arteries. Skull: No acute fracture or focal lesion. Sinuses/Orbits: Paranasal sinuses and mastoid air cells are clear. Bilateral lens replacement. Otherwise the orbits are unremarkable. Other: None. IMPRESSION: No acute intracranial abnormality. Electronically Signed   By: Iven Finn M.D.   On: 11/09/2020 20:21   CT HEAD WO CONTRAST (5MM)  Result Date: 11/07/2020 CLINICAL DATA:  Head injury after fall out of bed. EXAM: CT HEAD WITHOUT CONTRAST CT CERVICAL SPINE WITHOUT CONTRAST TECHNIQUE: Multidetector CT imaging of the head and cervical spine was performed following the standard protocol without intravenous contrast. Multiplanar CT image reconstructions of the cervical spine were also generated. COMPARISON:  Jul 24, 2020. FINDINGS: CT HEAD FINDINGS Brain: Mild chronic ischemic white matter disease is noted. No mass effect or midline shift is noted. Ventricular size is within normal limits. There is no evidence of mass lesion, hemorrhage or acute infarction. Vascular: No hyperdense vessel or unexpected calcification. Skull: Normal. Negative for fracture or focal lesion. Sinuses/Orbits: No acute finding. Other: None. CT CERVICAL SPINE FINDINGS Alignment: Minimal grade 1 retrolisthesis of C2-3 is noted secondary to moderate degenerative disc disease at this level. Mild grade 1 anterolisthesis is noted at C4-5 and C5-6 and  C6-7 secondary to posterior facet joint hypertrophy. Skull base and vertebrae: No acute fracture. No primary bone lesion or focal pathologic process. Soft tissues and spinal canal: No prevertebral fluid or swelling. No visible canal hematoma. Disc levels: Moderate degenerative disc disease is noted at C4-5, C5-6 and C6-7. Upper chest: Negative. Other: None. IMPRESSION: No acute intracranial abnormality seen. Moderate multilevel degenerative disc disease. No acute abnormality seen in the cervical spine. Electronically Signed   By: Marijo Conception M.D.   On: 11/07/2020 20:15   CT Cervical Spine Wo Contrast  Result Date: 11/07/2020 CLINICAL DATA:  Head injury after fall out of bed. EXAM: CT HEAD WITHOUT CONTRAST CT CERVICAL SPINE WITHOUT CONTRAST TECHNIQUE: Multidetector CT imaging of the head and cervical spine was performed following the standard protocol without intravenous contrast. Multiplanar CT image reconstructions of the cervical spine were also generated. COMPARISON:  Jul 24, 2020. FINDINGS: CT HEAD FINDINGS Brain: Mild chronic ischemic white matter disease is noted. No mass effect or midline shift is noted. Ventricular size is within normal limits. There is no evidence of mass lesion, hemorrhage or acute infarction. Vascular: No hyperdense vessel or unexpected calcification. Skull: Normal. Negative for fracture or focal lesion. Sinuses/Orbits: No acute finding. Other: None. CT CERVICAL SPINE FINDINGS Alignment: Minimal grade 1 retrolisthesis of C2-3 is noted secondary to moderate degenerative disc disease at this level. Mild grade 1 anterolisthesis is noted at C4-5 and C5-6 and C6-7 secondary to posterior facet joint hypertrophy. Skull base and vertebrae: No acute fracture. No primary bone lesion or focal pathologic process. Soft tissues and spinal canal: No prevertebral fluid or swelling. No visible canal hematoma. Disc levels: Moderate degenerative disc disease is noted at C4-5, C5-6 and C6-7. Upper  chest: Negative. Other: None. IMPRESSION: No acute intracranial abnormality seen. Moderate multilevel degenerative disc disease. No acute abnormality seen in the cervical spine. Electronically Signed   By: Marijo Conception M.D.   On: 11/07/2020 20:15   CT Thoracic Spine Wo Contrast  Result Date: 11/07/2020 CLINICAL DATA:  Mid-back pain EXAM:  CT THORACIC SPINE WITHOUT CONTRAST TECHNIQUE: Multidetector CT images of the thoracic were obtained using the standard protocol without intravenous contrast. COMPARISON:  None. FINDINGS: Alignment: No substantial sagittal subluxation in the thoracic spine. Mild S-shaped thoracic curvature. Vertebrae: Vertebral body heights are maintained. No evidence of acute fracture Paraspinal and other soft tissues: Partially imaged layering small bilateral pleural effusions with overlying opacities. Disc levels: Multilevel degenerative disc disease, greatest and moderate at T5-T6. IMPRESSION: 1. No evidence of acute fracture or traumatic malalignment. 2. Multilevel degenerative disc disease, greatest and moderate at T5-T6. 3. Partially imaged layering small bilateral pleural effusions with overlying opacities. Electronically Signed   By: Margaretha Sheffield M.D.   On: 11/07/2020 20:04   CT Lumbar Spine Wo Contrast  Result Date: 11/07/2020 CLINICAL DATA:  Low back pain, trauma EXAM: CT LUMBAR SPINE WITHOUT CONTRAST TECHNIQUE: Multidetector CT imaging of the lumbar spine was performed without intravenous contrast administration. Multiplanar CT image reconstructions were also generated. COMPARISON:  CT abdomen/pelvis 10/27/2020. FINDINGS: Segmentation: 5 lumbar type vertebral bodies. Alignment: Similar alignment. Slight retrolisthesis of L5-S1.Dextrocurvature, centered at L3-L4. Vertebrae: Vertebral body heights are maintained. No evidence of acute fracture. Osteopenia. Paraspinal and other soft tissues: Aorto bi-iliac calcific atherosclerosis. Disc levels: Severe multilevel degenerative  disc disease with disc height loss, endplate sclerosis vacuum disc phenomenon and posterior disc/osteophyte complexes. Multilevel facet arthropathy. Resulting multilevel foraminal stenosis, greatest and likely severe on the right at L5-S1. Multilevel disc bulges, including right inferiorly dissecting paracentral disc protrusion at L1-L2 that narrows the right subarticular recess. IMPRESSION: 1. No evidence of acute fracture or traumatic malalignment. 2. Severe multilevel degenerative disease with multilevel disc bulges, including right inferiorly dissecting paracentral disc protrusion at L1-L2. Multilevel foraminal stenosis, greatest and likely severe on the right at L5-S1. MRI of the lumbar spine could further characterize the canal and foramina if clinically indicated. 3. Dextrocurvature, centered at L3-L4. Electronically Signed   By: Margaretha Sheffield M.D.   On: 11/07/2020 20:17   CT Abdomen Pelvis W Contrast  Result Date: 10/27/2020 CLINICAL DATA:  Generalized abdominal pain.  Vomiting.  Diarrhea. EXAM: CT ABDOMEN AND PELVIS WITH CONTRAST TECHNIQUE: Multidetector CT imaging of the abdomen and pelvis was performed using the standard protocol following bolus administration of intravenous contrast. CONTRAST:  32m OMNIPAQUE IOHEXOL 350 MG/ML SOLN COMPARISON:  10/10/2020 FINDINGS: Lower Chest: No acute findings. Mild bilateral lower lobe bronchiectasis and pleural-parenchymal scarring. Hepatobiliary: No hepatic masses identified. Gallbladder is unremarkable. No evidence of biliary ductal dilatation. Pancreas: 11 mm simple appearing cyst in the pancreatic neck remains stable. No evidence of pancreatic ductal dilatation or peripancreatic inflammatory changes. Spleen: Within normal limits in size. Splenic cysts remain stable, largest measuring 5 cm. Adrenals/Urinary Tract: A few tiny sub-cm renal cysts are again noted. No masses identified. No evidence of ureteral calculi or hydronephrosis. Unremarkable unopacified  urinary bladder. Stomach/Bowel: No evidence of obstruction, inflammatory process or abnormal fluid collections. Normal appendix visualized. Diverticulosis is seen mainly involving the sigmoid colon, however there is no evidence of diverticulitis. Vascular/Lymphatic: No pathologically enlarged lymph nodes. No acute vascular findings. Aortic atherosclerotic calcification noted. Reproductive: Prior hysterectomy noted. Adnexal regions are unremarkable in appearance. Other: Stable small bilateral inguinal hernias which contain only fat. Musculoskeletal: No suspicious bone lesions identified. Severe degenerative disc disease noted at L2-3, L3-4, and L4-5. IMPRESSION: No acute findings within the abdomen or pelvis. Colonic diverticulosis, without radiographic evidence of diverticulitis. Stable 11 mm simple appearing cyst in pancreatic neck. Recommend continued imaging follow-up in 2 years, preferably with  abdomen MRI without and with contrast. This recommendation follows ACR consensus guidelines: Management of Incidental Pancreatic Cysts: A White Paper of the ACR Incidental Findings Committee. [place] [id number]. Aortic Atherosclerosis (ICD10-I70.0). Electronically Signed   By: Marlaine Hind M.D.   On: 10/27/2020 17:30   DG Chest Port 1 View  Result Date: 11/09/2020 CLINICAL DATA:  Fever EXAM: PORTABLE CHEST 1 VIEW COMPARISON:  11/07/2020 FINDINGS: Stable cardiomediastinal contours. Small left pleural effusion. No focal airspace consolidation. No pneumothorax. No acute bony findings. Degenerative changes of the bilateral shoulders. IMPRESSION: Small left pleural effusion. Electronically Signed   By: Davina Poke D.O.   On: 11/09/2020 18:27   DG Chest Portable 1 View  Result Date: 10/27/2020 CLINICAL DATA:  Cough, weakness and generalized pain. Vomiting for 4 days. EXAM: PORTABLE CHEST 1 VIEW COMPARISON:  October 10, 2020. FINDINGS: EKG leads project over the chest. Cardiomediastinal contours are  stable likely with mild cardiac enlargement. Central pulmonary vascular congestion is a stable finding without signs of edema, consolidation or evidence of pleural effusion. No visible pneumothorax. On limited assessment no acute skeletal process. IMPRESSION: Stable mild cardiac enlargement and central pulmonary vascular congestion without acute process. Electronically Signed   By: Zetta Bills M.D.   On: 10/27/2020 14:54   DG Knee Complete 4 Views Left  Result Date: 11/07/2020 CLINICAL DATA:  Trauma to the left knee. EXAM: LEFT KNEE - COMPLETE 4+ VIEW COMPARISON:  Left knee radiograph dated 06/02/2007 FINDINGS: There is a total left knee arthroplasty. The arthroplasty appear intact in anatomic alignment. No evidence of loosening. There is no acute fracture or dislocation. Old healed fracture of proximal fibula. Bones are osteopenic. Soft tissues are unremarkable. No joint effusion. IMPRESSION: 1. No acute fracture or dislocation. 2. Total left knee arthroplasty appears intact and in anatomic alignment. Electronically Signed   By: [name] M.D.   On: 11/07/2020 20:02   DG Knee Complete 4 Views Right  Result Date: 11/07/2020 CLINICAL DATA:  Bilateral knee pain after fall out of bed tonight. EXAM: RIGHT KNEE - COMPLETE 4+ VIEW COMPARISON:  March 09, 2017. FINDINGS: There has been interval revision of right hip arthroplasty with long stem femoral and tibial components. No acute fracture or dislocation is noted. Vascular calcifications are noted. IMPRESSION: Postsurgical changes as described above. No definite acute abnormality seen. Electronically Signed   By: Marijo Conception M.D.   On: 11/07/2020 20:00    Labs:  CBC: Recent Labs    11/11/20 0101 11/12/20 0208 11/13/20 0032 11/14/20 0048  WBC 10.7* 7.8 9.8 10.1  HGB 7.9* 7.2* 7.5* 8.2*  HCT 26.7* 24.6* 26.8* 28.6*  PLT 342 321 338 354    COAGS: Recent Labs    08/19/20 2248 08/20/20 0503 09/14/20 0911  INR 1.1 1.0 1.5*  APTT   --  44*  --     BMP: Recent Labs    11/07/20 1829 11/10/20 0440 11/11/20 0101 11/13/20 0032  NA 134* 137 137 140  K 3.7 4.2 3.9 3.7  CL 102 106 109 111  CO2 22 22 22 22   GLUCOSE 137* 169* 222* 148*  BUN 12 12 17 16   CALCIUM 8.8* 8.8* 8.4* 8.3*  CREATININE 0.75 0.91 1.06* 0.87  GFRNONAA >60 >60 53* >60    LIVER FUNCTION TESTS: Recent Labs    10/27/20 1326 11/10/20 0440 11/11/20 0101 11/13/20 0032  BILITOT 0.5 0.5 0.1* 0.1*  AST 22 13* 16 40  ALT 11 8 7 20   ALKPHOS  77 80 80 94  PROT 6.8 4.7* 4.8* 4.6*  ALBUMIN 3.0* 1.7* 1.8* 1.9*    Assessment and Plan:  Macrocytic anemia with plan to proceed with bone marrow biopsy.  Anticipate tomorrow (9/7) morning.    Risks and benefits of bone marrow biopsy was discussed with the patient and/or patient's family including, but not limited to bleeding, infection, damage to adjacent structures or low yield requiring additional tests.  All of the questions were answered and there is agreement to proceed.  Consent signed and in chart.   Thank you for this interesting consult.  I greatly enjoyed meeting [name] and look forward to participating in their care.  A copy of this report was sent to the requesting provider on this date.  Electronically Signed: Trenton Founds Boisseau 11/14/2020, 12:36 PM   I spent a total of 20 Minutes  in face to face in clinical consultation, greater than 50% of which was counseling/coordinating care for bone marrow biopsy.

## 2020-11-14 NOTE — Discharge Summary (Signed)
Physician Discharge Summary  [name] SAY:[id number] DOB: 08/19/1940 DOA: 11/07/2020  PCP: [name], MD  Admit date: 11/07/2020 Discharge date: 11/14/2020  Time spent: 36 minutes  Recommendations for Outpatient Follow-up:  Recommend outpatient CBC Chem-12 1 week at skilled facility Follow-up results of CT-guided bone marrow biopsy performed this admission and will need follow-up with hematology Dr.Iruku in the outpatient setting Will need outpatient referral to general surgery regarding rectal prolapse as might have bearing on chronic low volume blood loss  Discharge Diagnoses:  MAIN problem for hospitalization   Possible GI bleed with unclear etiology at time of discharge  Please see below for itemized issues addressed in [place]- refer to other progress notes for clarity if needed  Discharge Condition: Fair  Diet recommendation: Heart healthy  Filed Weights   11/07/20 1812  Weight: 67.1 kg    History of present illness:  80 year old white female Prior PE on Eliquis diagnosed 07/28/2020,  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   Presented to emergency room 11/07/2020 with a fall from bed In Ed cleared for discharge home and was waiting to be sent home, became confused hypotensive started having rectal bleeding with a fever 100.4 hemoglobin dropped from 8--->5.3 patient was ordered 2 units PRBC   Underwent nonrevealing small bowel enteroscopy on 9/3--GI  signed off did not feel patient needed further work-up for colonoscopy  Hospital Course:  Acute blood loss anemia History of rectal prolapse hemorrhoid Status post 2 units PRBC 9/1. Switch Protonix to p.o. twice daily- continue soft diet by GI gave IV iron 9/4 Per gastroenterology would start on MiraLAX and refer as an outpatient to surgery for prolapsed rectum Resume ferrous gluconate q. OD on discharge Discussed with hematology Dr. Eula Fried in unlikely her anemia 2/2 hemolysis--could be bleed superimposed on chr Iron def anemia--CT BM biopsy to be done prior to d/c-- follow in the outpatient setting  by a hematologist who saw the patient Prior pulmonary embolism-Eliquis d/c 10/30/20 ct last admit not revealing for PE--Hold further DOAC at this time Rh Arthritis on Prednisone as OP stress dosing Cortef 50 q 12 on admit and now back to prednisone 5 daily resuscitated in ED 2/2-- saline locked blood pressures are stable DM TY 2 Home med metformin 500 twice daily, 70/30 insulin 10 twice daily held ----resumed at time of discharge CBGs 100-140 OSA not on BiPAP  COPD--not oxygen dependent priro to admit May continue albuterol 6 as needed Brovana 2 puffs twice daily Pulmicort 0.25 twice daily DuoNeb every 6 Steroids as above No need oxygen this admit post-procedure Depression/anxiety continue Cymbalta 60 hs  Procedures: EDG   9.3 Impression:               - Normal esophagus.                           - Normal stomach.                           -  Normal duodenal bulb, first portion of the                            duodenum, second portion of the duodenum, third                            portion of the duodenum and fourth portion of the                            duodenum. Suspect I reached D4/proximal jejunum                            junction. Recommendation:           - Return patient to hospital ward for ongoing care.                           - Full liquid diet today.                            - Continue present medications.                           - Stay off anticoagulants for the foreseeable                            future, unless absolutely needed.  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  Discharge Instructions   Discharge Instructions     Diet - low sodium heart healthy   Complete by: As directed    Increase activity slowly   Complete by: As directed       Allergies as of 11/14/2020       Reactions   Penicillins Hives   Has patient had a PCN reaction causing immediate rash, facial/tongue/throat swelling, SOB or lightheadedness with hypotension: Yes Has patient had a PCN reaction causing severe rash involving mucus membranes or skin necrosis: No Has patient had a PCN reaction that required hospitalization: No Has patient had a PCN reaction occurring within the last 10 years: No If all of the above answers are "NO", then may proceed with Cephalosporin use. Patient has tolerated ceftriaxone in the recent past.   Sulfamethoxazole-trimethoprim Nausea And Vomiting, Other (See Comments)   Stomach problems   Adalimumab Other (See Comments)   "Not effective"   Hydroxychloroquine Sulfate Other (See Comments)   "Plaquenil did not work after awhile"   Meperidine Other (See Comments)   Cramping   Meperidine Hcl Other (See Comments)   Cramping   Methotrexate Nausea And Vomiting, Other (See Comments)   "  Projectile vomiting"   Penicillin G  Sodium Hives   Phentermine-topiramate Nausea Only, Other (See Comments)   "Nausea and dizziness & on re-trial, mental side effects so really cannot take it   Sulfa Antibiotics Nausea And Vomiting, Other (See Comments)   Stomach problems/GI upset, also   Sulfonamide Derivatives Nausea And Vomiting, Other (See Comments)   "Stomach problems"        Medication List     STOP taking these medications    albuterol 108 (90 Base) MCG/ACT inhaler Commonly known as: VENTOLIN HFA       TAKE these medications    acetaminophen 500 MG tablet Commonly known as: TYLENOL Take 1,000 mg by mouth every 6 (six) hours as needed for mild pain (or headaches).   arformoterol 15 MCG/2ML Nebu Commonly known as: BROVANA Take 15 mcg by nebulization 2 (two) times daily as needed (for shortness of breath).   atorvastatin 20 MG tablet Commonly known as: LIPITOR Take 20 mg by mouth daily.   B-12 2500 MCG Tabs Take 2,500 mcg by mouth at bedtime.   B-D UF III MINI PEN NEEDLES 31G X 5 MM Misc Generic drug: Insulin Pen Needle SMARTSIG:1 Each SUB-Q 4 Times Daily   budesonide 0.25 MG/2ML nebulizer solution Commonly known as: PULMICORT Take 2 mLs (0.25 mg total) by nebulization 2 (two) times daily.   DULoxetine 60 MG capsule Commonly known as: CYMBALTA Take 60 mg by mouth at bedtime.   ezetimibe 10 MG tablet Commonly known as: ZETIA Take 10 mg by mouth at bedtime.   ferrous gluconate 324 MG tablet Commonly known as: FERGON Take 324 mg by mouth every other day.   ipratropium-albuterol 0.5-2.5 (3) MG/3ML Soln Commonly known as: DUONEB Take 3 mLs by nebulization every 6 (six) hours as needed (wheezing).   metFORMIN 500 MG tablet Commonly known as: GLUCOPHAGE Take 500 mg by mouth 2 (two) times daily.   NovoLIN 70/30 Kwikpen (70-30) 100 UNIT/ML KwikPen Generic drug: insulin isophane & regular human KwikPen Inject 10 Units into the skin 2 (two) times daily.   olopatadine 0.1 % ophthalmic  solution Commonly known as: PATANOL Place 1 drop into both eyes 2 (two) times daily. What changed:  when to take this reasons to take this   ondansetron 8 MG disintegrating tablet Commonly known as: ZOFRAN-ODT Take 8 mg by mouth every 8 (eight) hours as needed for nausea or vomiting (dissolve orally).   oxyCODONE 5 MG immediate release tablet Commonly known as: Oxy IR/ROXICODONE Take 1 tablet (5 mg total) by mouth every 6 (six) hours as needed for up to 20 doses for severe pain.   pantoprazole 40 MG tablet Commonly known as: Protonix Take one tablet once every morning What changed:  how much to take how to take this when to take this additional instructions   predniSONE 5 MG tablet Commonly known as: DELTASONE Take 5 mg by mouth at bedtime.   tolterodine 2 MG 24 hr capsule Commonly known as: DETROL LA Take 2 mg by mouth every evening.       Allergies  Allergen Reactions   Penicillins Hives    Has patient had a PCN reaction causing immediate rash, facial/tongue/throat swelling, SOB or lightheadedness with hypotension: Yes Has patient had a PCN reaction causing severe rash involving mucus membranes or skin necrosis: No Has patient had a PCN reaction that required hospitalization: No Has patient had a PCN reaction occurring within the last 10 years: No If all of the above answers are "NO",  then may proceed with Cephalosporin use. Patient has tolerated ceftriaxone in the recent past.   Sulfamethoxazole-Trimethoprim Nausea And Vomiting and Other (See Comments)    Stomach problems   Adalimumab Other (See Comments)    "Not effective"   Hydroxychloroquine Sulfate Other (See Comments)    "Plaquenil did not work after [employer]"   Meperidine Other (See Comments)    Cramping   Meperidine Hcl Other (See Comments)    Cramping    Methotrexate Nausea And Vomiting and Other (See Comments)    "Projectile vomiting"   Penicillin G Sodium Hives   Phentermine-Topiramate Nausea Only  and Other (See Comments)    "Nausea and dizziness & on re-trial, mental side effects so really cannot take it   Sulfa Antibiotics Nausea And Vomiting and Other (See Comments)    Stomach problems/GI upset, also   Sulfonamide Derivatives Nausea And Vomiting and Other (See Comments)    "Stomach problems"    Follow-up Information     Schedule an appointment as soon as possible for a visit  with [name], MD.   Specialty: Internal Medicine Contact information: [address] [place] [address] [phone]                  The results of significant diagnostics from this hospitalization (including imaging, microbiology, ancillary and laboratory) are listed below for reference.    Significant Diagnostic Studies: DG Chest 1 View  Result Date: 11/07/2020 CLINICAL DATA:  Fall out of bed tonight. EXAM: CHEST  1 VIEW COMPARISON:  October 27, 2020. FINDINGS: Stable cardiomediastinal silhouette. Both lungs are clear. The visualized skeletal structures are unremarkable. IMPRESSION: No active disease. Electronically Signed   By: Marijo Conception M.D.   On: 11/07/2020 20:05   DG Pelvis 1-2 Views  Result Date: 11/07/2020 CLINICAL DATA:  Bilateral hip pain after fall out of bed tonight. EXAM: PELVIS - 1-2 VIEW COMPARISON:  None. FINDINGS: There is no evidence of pelvic fracture or diastasis. No pelvic bone lesions are seen. IMPRESSION: Negative. Electronically Signed   By: Marijo Conception M.D.   On: 11/07/2020 20:06   CT HEAD WO CONTRAST (5MM)  Result Date: 11/09/2020 CLINICAL DATA:  Mental status change.  Unknown cause EXAM: CT HEAD WITHOUT CONTRAST TECHNIQUE: Contiguous axial images were obtained from the base of the skull through the vertex without intravenous contrast. COMPARISON:  CT head 11/07/2020 BRAIN: BRAIN Cerebral ventricle sizes are concordant with the degree of cerebral volume loss. Patchy and confluent areas of decreased attenuation are noted throughout the deep and  periventricular white matter of the cerebral hemispheres bilaterally, compatible with chronic microvascular ischemic disease. No evidence of large-territorial acute infarction. No parenchymal hemorrhage. No mass lesion. No extra-axial collection. No mass effect or midline shift. No hydrocephalus. Basilar cisterns are patent. Vascular: No hyperdense vessel. Atherosclerotic calcifications are present within the cavernous internal carotid and vertebral arteries. Skull: No acute fracture or focal lesion. Sinuses/Orbits: Paranasal sinuses and mastoid air cells are clear. Bilateral lens replacement. Otherwise the orbits are unremarkable. Other: None. IMPRESSION: No acute intracranial abnormality. Electronically Signed   By: Iven Finn M.D.   On: 11/09/2020 20:21   CT HEAD WO CONTRAST (5MM)  Result Date: 11/07/2020 CLINICAL DATA:  Head injury after fall out of bed. EXAM: CT HEAD WITHOUT CONTRAST CT CERVICAL SPINE WITHOUT CONTRAST TECHNIQUE: Multidetector CT imaging of the head and cervical spine was performed following the standard protocol without intravenous contrast. Multiplanar CT image reconstructions of the cervical spine were also  generated. COMPARISON:  Jul 24, 2020. FINDINGS: CT HEAD FINDINGS Brain: Mild chronic ischemic white matter disease is noted. No mass effect or midline shift is noted. Ventricular size is within normal limits. There is no evidence of mass lesion, hemorrhage or acute infarction. Vascular: No hyperdense vessel or unexpected calcification. Skull: Normal. Negative for fracture or focal lesion. Sinuses/Orbits: No acute finding. Other: None. CT CERVICAL SPINE FINDINGS Alignment: Minimal grade 1 retrolisthesis of C2-3 is noted secondary to moderate degenerative disc disease at this level. Mild grade 1 anterolisthesis is noted at C4-5 and C5-6 and C6-7 secondary to posterior facet joint hypertrophy. Skull base and vertebrae: No acute fracture. No primary bone lesion or focal pathologic  process. Soft tissues and spinal canal: No prevertebral fluid or swelling. No visible canal hematoma. Disc levels: Moderate degenerative disc disease is noted at C4-5, C5-6 and C6-7. Upper chest: Negative. Other: None. IMPRESSION: No acute intracranial abnormality seen. Moderate multilevel degenerative disc disease. No acute abnormality seen in the cervical spine. Electronically Signed   By: Marijo Conception M.D.   On: 11/07/2020 20:15   CT Cervical Spine Wo Contrast  Result Date: 11/07/2020 CLINICAL DATA:  Head injury after fall out of bed. EXAM: CT HEAD WITHOUT CONTRAST CT CERVICAL SPINE WITHOUT CONTRAST TECHNIQUE: Multidetector CT imaging of the head and cervical spine was performed following the standard protocol without intravenous contrast. Multiplanar CT image reconstructions of the cervical spine were also generated. COMPARISON:  Jul 24, 2020. FINDINGS: CT HEAD FINDINGS Brain: Mild chronic ischemic white matter disease is noted. No mass effect or midline shift is noted. Ventricular size is within normal limits. There is no evidence of mass lesion, hemorrhage or acute infarction. Vascular: No hyperdense vessel or unexpected calcification. Skull: Normal. Negative for fracture or focal lesion. Sinuses/Orbits: No acute finding. Other: None. CT CERVICAL SPINE FINDINGS Alignment: Minimal grade 1 retrolisthesis of C2-3 is noted secondary to moderate degenerative disc disease at this level. Mild grade 1 anterolisthesis is noted at C4-5 and C5-6 and C6-7 secondary to posterior facet joint hypertrophy. Skull base and vertebrae: No acute fracture. No primary bone lesion or focal pathologic process. Soft tissues and spinal canal: No prevertebral fluid or swelling. No visible canal hematoma. Disc levels: Moderate degenerative disc disease is noted at C4-5, C5-6 and C6-7. Upper chest: Negative. Other: None. IMPRESSION: No acute intracranial abnormality seen. Moderate multilevel degenerative disc disease. No acute  abnormality seen in the cervical spine. Electronically Signed   By: Marijo Conception M.D.   On: 11/07/2020 20:15   CT Thoracic Spine Wo Contrast  Result Date: 11/07/2020 CLINICAL DATA:  Mid-back pain EXAM: CT THORACIC SPINE WITHOUT CONTRAST TECHNIQUE: Multidetector CT images of the thoracic were obtained using the standard protocol without intravenous contrast. COMPARISON:  None. FINDINGS: Alignment: No substantial sagittal subluxation in the thoracic spine. Mild S-shaped thoracic curvature. Vertebrae: Vertebral body heights are maintained. No evidence of acute fracture Paraspinal and other soft tissues: Partially imaged layering small bilateral pleural effusions with overlying opacities. Disc levels: Multilevel degenerative disc disease, greatest and moderate at T5-T6. IMPRESSION: 1. No evidence of acute fracture or traumatic malalignment. 2. Multilevel degenerative disc disease, greatest and moderate at T5-T6. 3. Partially imaged layering small bilateral pleural effusions with overlying opacities. Electronically Signed   By: Margaretha Sheffield M.D.   On: 11/07/2020 20:04   CT Lumbar Spine Wo Contrast  Result Date: 11/07/2020 CLINICAL DATA:  Low back pain, trauma EXAM: CT LUMBAR SPINE WITHOUT CONTRAST TECHNIQUE: Multidetector CT imaging of  the lumbar spine was performed without intravenous contrast administration. Multiplanar CT image reconstructions were also generated. COMPARISON:  CT abdomen/pelvis 10/27/2020. FINDINGS: Segmentation: 5 lumbar type vertebral bodies. Alignment: Similar alignment. Slight retrolisthesis of L5-S1.Dextrocurvature, centered at L3-L4. Vertebrae: Vertebral body heights are maintained. No evidence of acute fracture. Osteopenia. Paraspinal and other soft tissues: Aorto bi-iliac calcific atherosclerosis. Disc levels: Severe multilevel degenerative disc disease with disc height loss, endplate sclerosis vacuum disc phenomenon and posterior disc/osteophyte complexes. Multilevel facet  arthropathy. Resulting multilevel foraminal stenosis, greatest and likely severe on the right at L5-S1. Multilevel disc bulges, including right inferiorly dissecting paracentral disc protrusion at L1-L2 that narrows the right subarticular recess. IMPRESSION: 1. No evidence of acute fracture or traumatic malalignment. 2. Severe multilevel degenerative disease with multilevel disc bulges, including right inferiorly dissecting paracentral disc protrusion at L1-L2. Multilevel foraminal stenosis, greatest and likely severe on the right at L5-S1. MRI of the lumbar spine could further characterize the canal and foramina if clinically indicated. 3. Dextrocurvature, centered at L3-L4. Electronically Signed   By: Margaretha Sheffield M.D.   On: 11/07/2020 20:17   CT Abdomen Pelvis W Contrast  Result Date: 10/27/2020 CLINICAL DATA:  Generalized abdominal pain.  Vomiting.  Diarrhea. EXAM: CT ABDOMEN AND PELVIS WITH CONTRAST TECHNIQUE: Multidetector CT imaging of the abdomen and pelvis was performed using the standard protocol following bolus administration of intravenous contrast. CONTRAST:  42m OMNIPAQUE IOHEXOL 350 MG/ML SOLN COMPARISON:  10/10/2020 FINDINGS: Lower Chest: No acute findings. Mild bilateral lower lobe bronchiectasis and pleural-parenchymal scarring. Hepatobiliary: No hepatic masses identified. Gallbladder is unremarkable. No evidence of biliary ductal dilatation. Pancreas: 11 mm simple appearing cyst in the pancreatic neck remains stable. No evidence of pancreatic ductal dilatation or peripancreatic inflammatory changes. Spleen: Within normal limits in size. Splenic cysts remain stable, largest measuring 5 cm. Adrenals/Urinary Tract: A few tiny sub-cm renal cysts are again noted. No masses identified. No evidence of ureteral calculi or hydronephrosis. Unremarkable unopacified urinary bladder. Stomach/Bowel: No evidence of obstruction, inflammatory process or abnormal fluid collections. Normal appendix  visualized. Diverticulosis is seen mainly involving the sigmoid colon, however there is no evidence of diverticulitis. Vascular/Lymphatic: No pathologically enlarged lymph nodes. No acute vascular findings. Aortic atherosclerotic calcification noted. Reproductive: Prior hysterectomy noted. Adnexal regions are unremarkable in appearance. Other: Stable small bilateral inguinal hernias which contain only fat. Musculoskeletal: No suspicious bone lesions identified. Severe degenerative disc disease noted at L2-3, L3-4, and L4-5. IMPRESSION: No acute findings within the abdomen or pelvis. Colonic diverticulosis, without radiographic evidence of diverticulitis. Stable 11 mm simple appearing cyst in pancreatic neck. Recommend continued imaging follow-up in 2 years, preferably with abdomen MRI without and with contrast. This recommendation follows ACR consensus guidelines: Management of Incidental Pancreatic Cysts: A White Paper of the ACR Incidental Findings Committee. [id number] Aortic Atherosclerosis (ICD10-I70.0). Electronically Signed   By: JMarlaine HindM.D.   On: 10/27/2020 17:30   DG Chest Port 1 View  Result Date: 11/09/2020 CLINICAL DATA:  Fever EXAM: PORTABLE CHEST 1 VIEW COMPARISON:  11/07/2020 FINDINGS: Stable cardiomediastinal contours. Small left pleural effusion. No focal airspace consolidation. No pneumothorax. No acute bony findings. Degenerative changes of the bilateral shoulders. IMPRESSION: Small left pleural effusion. Electronically Signed   By: NDavina PokeD.O.   On: 11/09/2020 18:27   DG Chest Portable 1 View  Result Date: 10/27/2020 CLINICAL DATA:  Cough, weakness and generalized pain. Vomiting for 4 days. EXAM: PORTABLE CHEST 1 VIEW COMPARISON:  October 10, 2020. FINDINGS: EKG  leads project over the chest. Cardiomediastinal contours are stable likely with mild cardiac enlargement. Central pulmonary vascular congestion is a stable finding without signs of edema,  consolidation or evidence of pleural effusion. No visible pneumothorax. On limited assessment no acute skeletal process. IMPRESSION: Stable mild cardiac enlargement and central pulmonary vascular congestion without acute process. Electronically Signed   By: Zetta Bills M.D.   On: 10/27/2020 14:54   DG Knee Complete 4 Views Left  Result Date: 11/07/2020 CLINICAL DATA:  Trauma to the left knee. EXAM: LEFT KNEE - COMPLETE 4+ VIEW COMPARISON:  Left knee radiograph dated 06/02/2007 FINDINGS: There is a total left knee arthroplasty. The arthroplasty appear intact in anatomic alignment. No evidence of loosening. There is no acute fracture or dislocation. Old healed fracture of proximal fibula. Bones are osteopenic. Soft tissues are unremarkable. No joint effusion. IMPRESSION: 1. No acute fracture or dislocation. 2. Total left knee arthroplasty appears intact and in anatomic alignment. Electronically Signed   By: [name] M.D.   On: 11/07/2020 20:02   DG Knee Complete 4 Views Right  Result Date: 11/07/2020 CLINICAL DATA:  Bilateral knee pain after fall out of bed tonight. EXAM: RIGHT KNEE - COMPLETE 4+ VIEW COMPARISON:  March 09, 2017. FINDINGS: There has been interval revision of right hip arthroplasty with long stem femoral and tibial components. No acute fracture or dislocation is noted. Vascular calcifications are noted. IMPRESSION: Postsurgical changes as described above. No definite acute abnormality seen. Electronically Signed   By: Marijo Conception M.D.   On: 11/07/2020 20:00    Microbiology: Recent Results (from the past 240 hour(s))  SARS CORONAVIRUS 2 (TAT 6-24 HRS) Nasopharyngeal Nasopharyngeal Swab     Status: None   Collection Time: 11/09/20  9:49 PM   Specimen: Nasopharyngeal Swab  Result Value Ref Range Status   SARS Coronavirus 2 NEGATIVE NEGATIVE Final    Comment: (NOTE) SARS-CoV-2 target nucleic acids are NOT DETECTED.  The SARS-CoV-2 RNA is generally detectable in upper  and lower respiratory specimens during the acute phase of infection. Negative results do not preclude SARS-CoV-2 infection, do not rule out co-infections with other pathogens, and should not be used as the sole basis for treatment or other patient management decisions. Negative results must be combined with clinical observations, patient history, and epidemiological information. The expected result is Negative.  Fact Sheet for Patients: SugarRoll.be  Fact Sheet for Healthcare Providers: https://www.woods-mathews.com/  This test is not yet approved or cleared by the [place] FDA and  has been authorized for detection and/or diagnosis of SARS-CoV-2 by FDA under an Emergency Use Authorization (EUA). This EUA will remain  in effect (meaning this test can be used) for the duration of the COVID-19 declaration under Se ction 564(b)(1) of the Act, 21 U.S.C. section 360bbb-3(b)(1), unless the authorization is terminated or revoked sooner.  Performed at [facility], [place] [address], [place], [place] [address]   Culture, blood (routine x 2)     Status: None   Collection Time: 11/09/20 10:00 PM   Specimen: BLOOD  Result Value Ref Range Status   Specimen Description BLOOD BLOOD RIGHT WRIST  Final   Special Requests   Final    BOTTLES DRAWN AEROBIC AND ANAEROBIC Blood Culture adequate volume   Culture   Final    NO GROWTH 5 DAYS Performed at [facility], [place] [address] [place], [place] [address]    Report Status 11/14/2020 FINAL  Final  Culture, blood (routine  x 2)     Status: None   Collection Time: 11/09/20 10:00 PM   Specimen: BLOOD RIGHT FOREARM  Result Value Ref Range Status   Specimen Description BLOOD RIGHT FOREARM  Final   Special Requests   Final    BOTTLES DRAWN AEROBIC AND ANAEROBIC Blood Culture adequate volume   Culture   Final    NO GROWTH 5 DAYS Performed at [facility], [address], [place],  [place] [address]    Report Status 11/14/2020 FINAL  Final     Labs: Basic Metabolic Panel: Recent Labs  Lab 11/07/20 1829 11/10/20 0440 11/11/20 0101 11/13/20 0032  NA 134* 137 137 140  K 3.7 4.2 3.9 3.7  CL 102 106 109 111  CO2 22 22 22 22   GLUCOSE 137* 169* 222* 148*  BUN 12 12 17 16   CREATININE 0.75 0.91 1.06* 0.87  CALCIUM 8.8* 8.8* 8.4* 8.3*   Liver Function Tests: Recent Labs  Lab 11/10/20 0440 11/11/20 0101 11/13/20 0032  AST 13* 16 40  ALT 8 7 20   ALKPHOS 80 80 94  BILITOT 0.5 0.1* 0.1*  PROT 4.7* 4.8* 4.6*  ALBUMIN 1.7* 1.8* 1.9*   No results for input(s): LIPASE, AMYLASE in the last 168 hours. No results for input(s): AMMONIA in the last 168 hours. CBC: Recent Labs  Lab 11/07/20 1829 11/08/20 1934 11/10/20 0844 11/11/20 0101 11/12/20 0208 11/13/20 0032 11/14/20 0048  WBC 11.2*   < > 7.6 10.7* 7.8 9.8 10.1  NEUTROABS 9.1*  --   --   --  5.9 7.2 7.3  HGB 9.2*   < > 7.9* 7.9* 7.2* 7.5* 8.2*  HCT 31.1*   < > 27.0* 26.7* 24.6* 26.8* 28.6*  MCV 75.9*   < > 76.7* 75.9* 75.9* 77.5* 76.5*  PLT 369   < > 310 342 321 338 354   < > = values in this interval not displayed.   Cardiac Enzymes: Recent Labs  Lab 11/07/20 1829  CKTOTAL 33*   BNP: BNP (last 3 results) Recent Labs    05/22/20 1800 07/21/20 2341  BNP 74.1 84.4    ProBNP (last 3 results) No results for input(s): PROBNP in the last 8760 hours.  CBG: Recent Labs  Lab 11/13/20 1134 11/13/20 1745 11/13/20 2041 11/14/20 0802 11/14/20 1220  GLUCAP 110* 97 166* 112* 131*       Signed:  Nita Sells MD   [facility] 11/14/2020, 12:34 PM

## 2020-11-14 NOTE — TOC Progression Note (Signed)
Transition of Care [facility]) - Progression Note    Patient Details  Name: [name] MRN: [id number] Date of Birth: [birth year]  Transition of Care [facility]) CM/SW Contact  Emeterio Reeve, [place] Phone Number: 11/14/2020, 12:51 PM  Clinical Narrative:     CSW met with pt at bedside. CSW re gave pt bed offers. Pt stated she wants [facility]. CSW informed pt she was declined, no reason given. CSW asked pt to review accepted facilities and give answer today.   Expected Discharge Plan: [place] Barriers to Discharge: Continued Medical Work up  Expected Discharge Plan and Services Expected Discharge Plan: [place] arrangements for the past 2 months: Single Family Home Expected Discharge Date: 11/15/20                                     Social Determinants of Health (SDOH) Interventions    Readmission Risk Interventions No flowsheet data found.  Emeterio Reeve, LCSW Clinical Social Worker

## 2020-11-14 NOTE — Telephone Encounter (Signed)
Scheduled per sch msg. Called, not able to leave msg

## 2020-11-15 ENCOUNTER — Inpatient Hospital Stay (HOSPITAL_COMMUNITY): Payer: Medicare Other

## 2020-11-15 LAB — GLUCOSE, CAPILLARY
Glucose-Capillary: 210 mg/dL — ABNORMAL HIGH (ref 70–99)
Glucose-Capillary: 178 mg/dL — ABNORMAL HIGH (ref 70–99)
Glucose-Capillary: 84 mg/dL (ref 70–99)
Glucose-Capillary: 153 mg/dL — ABNORMAL HIGH (ref 70–99)
Glucose-Capillary: 158 mg/dL — ABNORMAL HIGH (ref 70–99)

## 2020-11-15 LAB — SARS CORONAVIRUS 2 (TAT 6-24 HRS): SARS Coronavirus 2: NEGATIVE

## 2020-11-15 LAB — KAPPA/LAMBDA LIGHT CHAINS
Kappa free light chain: 31.3 mg/L — ABNORMAL HIGH (ref 3.3–19.4)
Kappa, lambda light chain ratio: 0.97 (ref 0.26–1.65)
Lambda free light chains: 32.2 mg/L — ABNORMAL HIGH (ref 5.7–26.3)

## 2020-11-15 MED ORDER — KETOROLAC TROMETHAMINE 0.5 % OP SOLN
1.0000 [drp] | Freq: Four times a day (QID) | OPHTHALMIC | Status: DC
  Administered 2020-11-15 – 2020-11-16 (×4): 1 [drp] via OPHTHALMIC
  Filled 2020-11-15: qty 5

## 2020-11-15 MED ORDER — EYE WASH OPHTH SOLN
1.0000 [drp] | OPHTHALMIC | Status: DC | PRN
  Filled 2020-11-15: qty 118

## 2020-11-15 MED ORDER — OXYCODONE HCL 5 MG PO TABS: 5.0000 mg | ORAL_TABLET | Freq: Four times a day (QID) | ORAL | 0 refills | Status: AC | PRN

## 2020-11-15 MED ORDER — FENTANYL CITRATE (PF) 100 MCG/2ML IJ SOLN
INTRAMUSCULAR | Status: AC
  Filled 2020-11-15: qty 2

## 2020-11-15 MED ORDER — MIDAZOLAM HCL 2 MG/2ML IJ SOLN
INTRAMUSCULAR | Status: DC | PRN
  Administered 2020-11-15 (×2): 1 mg via INTRAVENOUS

## 2020-11-15 MED ORDER — LIDOCAINE-EPINEPHRINE 1 %-1:100000 IJ SOLN
INTRAMUSCULAR | Status: AC
  Filled 2020-11-15: qty 1

## 2020-11-15 MED ORDER — FENTANYL CITRATE (PF) 100 MCG/2ML IJ SOLN
INTRAMUSCULAR | Status: DC | PRN
  Administered 2020-11-15: 50 ug via INTRAVENOUS
  Administered 2020-11-15 (×2): 25 ug via INTRAVENOUS

## 2020-11-15 MED ORDER — MIDAZOLAM HCL 2 MG/2ML IJ SOLN
INTRAMUSCULAR | Status: AC
  Filled 2020-11-15: qty 2

## 2020-11-15 MED ORDER — LIDOCAINE HCL 1 % IJ SOLN
INTRAMUSCULAR | Status: AC
  Filled 2020-11-15: qty 10

## 2020-11-15 NOTE — Procedures (Signed)
Vascular and Interventional Radiology Procedure Note  Patient: [name] DOB: 08-11-1940 Medical Record Number: [id number] Note Date/Time: 11/15/20 10:20 AM   Performing Physician: Michaelle Birks, MD Assistant(s): None  Diagnosis: Anemia  Procedure: BONE MARROW ASPIRATION AND BIOPSY  Anesthesia: Conscious Sedation Complications: None Estimated Blood Loss: Minimal Specimens: Sent for Pathology  Findings:  Successful CT-guided bone marrow biopsy A total of 1 cores were obtained. Hemostasis of the tract was achieved using Manual Pressure.  Plan: Bed rest for 1 hours.  See detailed procedure note with images in PACS. The patient tolerated the procedure well without incident or complication and was returned to Floor Bed in stable condition.    Michaelle Birks, MD [facility]ists [facility] Radiology   Pager. [phone]

## 2020-11-15 NOTE — Care Management Obs Status (Signed)
[place] NOTIFICATION   Patient Details  Name: [name] MRN: [id number] Date of Birth: 11/03/1940   Medicare Observation Status Notification Given:       [name] 11/15/2020, 4:26 PM

## 2020-11-15 NOTE — Sedation Documentation (Signed)
Patient is resting

## 2020-11-15 NOTE — Sedation Documentation (Signed)
Report called to [place], floor rn

## 2020-11-15 NOTE — Sedation Documentation (Signed)
Patient is resting comfortably. 

## 2020-11-15 NOTE — Sedation Documentation (Signed)
Pt arrived to radiology nurses station preprocedure. Pt is alert and oriented, npo, consent signed. Vitals stable. Awaiting MD to see pt. Pt denies pain only states that her eyes are itchy. Provided warm, wet cloth to wipe eyes, pt appreciative.

## 2020-11-15 NOTE — Progress Notes (Signed)
[facility] PROGRESS NOTE  Patient: ADIYAH LAME YRY:[id number]   PCP: [name], MD DOB: 1940-12-12   DOA: 11/07/2020   DOS: 11/15/2020    Subjective: Seen after bone marrow biopsy.  No pain.  No nausea no vomiting.  Concerned with regards to discharge.  Objective:  Vitals:   11/15/20 1015 11/15/20 1709  BP: (!) 149/79 113/65  Pulse: 94 (!) 106  Resp: 15 18  Temp:  98 F (36.7 C)  SpO2: 90% 92%    S1-S2 present. Clear to auscultation. Bowel sound present. No focal deficit. No edema.  Assessment and plan: Anemia. Continue to follow-up with hematology outpatient.  Rectal prolapse. Requires outpatient surgical follow-up.   Author: Berle Mull, MD [facility] 11/15/2020 8:03 PM   If 7PM-7AM, please contact night-coverage at [email or web address]

## 2020-11-15 NOTE — Sedation Documentation (Signed)
Pt in CT and prepped for BM Bx. Vitals stable, cytology present for case. Pt has no complaints at this time.

## 2020-11-15 NOTE — Care Management Important Message (Signed)
Important Message  Patient Details  Name: [name] MRN: [id number] Date of Birth: November 11, 1940   Medicare Important Message Given:  Yes     [name] 11/15/2020, 4:20 PM

## 2020-11-15 NOTE — TOC Progression Note (Addendum)
Transition of Care [facility]) - Progression Note    Patient Details  Name: ALAINAH PHANG MRN: [id number] Date of Birth: 07/12/1940  Transition of Care [facility]) CM/SW Contact  Emeterio Reeve, [place] Phone Number: 11/15/2020, 12:58 PM  Clinical Narrative:     CSW met with pt at bedside. Pt inquired about [facility]. CSW explained again that [facility] is not an option for SNF. CSW urged pt to pick one of the 6 facilities that accepted her.   Pt asked CSW to call her son about another facility. [facility] [place].  Pts son requested that [place] call his sister to help with DC planning. CSW called Pam and left a message requesting a call back  1:00pm- CSW called [facility] and they do not admit pts who do not live in there community.  1:30 CSW spoke to pts daughter on phone. Daughter stated they chose [facility]. CSW confirmed that [facility] can accept her for SNF tomorrow. CSW requested covid test from MD  Expected Discharge Plan: [place] Barriers to Discharge: Continued Medical Work up  Expected Discharge Plan and Services Expected Discharge Plan: [place] arrangements for the past 2 months: Single Family Home Expected Discharge Date: 11/15/20                                     Social Determinants of Health (SDOH) Interventions    Readmission Risk Interventions No flowsheet data found.  Emeterio Reeve, LCSW Clinical Social Worker

## 2020-11-15 NOTE — Sedation Documentation (Signed)
Vital signs stable. 

## 2020-11-16 DIAGNOSIS — R0902 Hypoxemia: Secondary | ICD-10-CM | POA: Diagnosis not present

## 2020-11-16 DIAGNOSIS — M069 Rheumatoid arthritis, unspecified: Secondary | ICD-10-CM | POA: Diagnosis not present

## 2020-11-16 DIAGNOSIS — Q2739 Arteriovenous malformation, other site: Secondary | ICD-10-CM | POA: Diagnosis not present

## 2020-11-16 DIAGNOSIS — Z8719 Personal history of other diseases of the digestive system: Secondary | ICD-10-CM | POA: Diagnosis not present

## 2020-11-16 DIAGNOSIS — E7849 Other hyperlipidemia: Secondary | ICD-10-CM | POA: Diagnosis not present

## 2020-11-16 DIAGNOSIS — I69828 Other speech and language deficits following other cerebrovascular disease: Secondary | ICD-10-CM | POA: Diagnosis not present

## 2020-11-16 DIAGNOSIS — N183 Chronic kidney disease, stage 3 unspecified: Secondary | ICD-10-CM | POA: Diagnosis not present

## 2020-11-16 DIAGNOSIS — M47819 Spondylosis without myelopathy or radiculopathy, site unspecified: Secondary | ICD-10-CM | POA: Diagnosis not present

## 2020-11-16 DIAGNOSIS — K219 Gastro-esophageal reflux disease without esophagitis: Secondary | ICD-10-CM | POA: Diagnosis not present

## 2020-11-16 DIAGNOSIS — M25561 Pain in right knee: Secondary | ICD-10-CM | POA: Diagnosis not present

## 2020-11-16 DIAGNOSIS — M797 Fibromyalgia: Secondary | ICD-10-CM | POA: Diagnosis not present

## 2020-11-16 DIAGNOSIS — Z79899 Other long term (current) drug therapy: Secondary | ICD-10-CM | POA: Diagnosis not present

## 2020-11-16 DIAGNOSIS — Z86711 Personal history of pulmonary embolism: Secondary | ICD-10-CM | POA: Diagnosis not present

## 2020-11-16 DIAGNOSIS — J449 Chronic obstructive pulmonary disease, unspecified: Secondary | ICD-10-CM | POA: Diagnosis not present

## 2020-11-16 DIAGNOSIS — E119 Type 2 diabetes mellitus without complications: Secondary | ICD-10-CM | POA: Diagnosis not present

## 2020-11-16 DIAGNOSIS — J439 Emphysema, unspecified: Secondary | ICD-10-CM | POA: Diagnosis not present

## 2020-11-16 DIAGNOSIS — R279 Unspecified lack of coordination: Secondary | ICD-10-CM | POA: Diagnosis not present

## 2020-11-16 DIAGNOSIS — R5381 Other malaise: Secondary | ICD-10-CM | POA: Diagnosis not present

## 2020-11-16 DIAGNOSIS — M6281 Muscle weakness (generalized): Secondary | ICD-10-CM | POA: Diagnosis not present

## 2020-11-16 DIAGNOSIS — G9340 Encephalopathy, unspecified: Secondary | ICD-10-CM | POA: Diagnosis not present

## 2020-11-16 DIAGNOSIS — J90 Pleural effusion, not elsewhere classified: Secondary | ICD-10-CM | POA: Diagnosis not present

## 2020-11-16 DIAGNOSIS — K623 Rectal prolapse: Secondary | ICD-10-CM | POA: Diagnosis not present

## 2020-11-16 DIAGNOSIS — M810 Age-related osteoporosis without current pathological fracture: Secondary | ICD-10-CM | POA: Diagnosis not present

## 2020-11-16 DIAGNOSIS — E1169 Type 2 diabetes mellitus with other specified complication: Secondary | ICD-10-CM | POA: Diagnosis not present

## 2020-11-16 DIAGNOSIS — M50321 Other cervical disc degeneration at C4-C5 level: Secondary | ICD-10-CM | POA: Diagnosis not present

## 2020-11-16 DIAGNOSIS — R58 Hemorrhage, not elsewhere classified: Secondary | ICD-10-CM | POA: Diagnosis not present

## 2020-11-16 DIAGNOSIS — M791 Myalgia, unspecified site: Secondary | ICD-10-CM | POA: Diagnosis not present

## 2020-11-16 DIAGNOSIS — R41841 Cognitive communication deficit: Secondary | ICD-10-CM | POA: Diagnosis not present

## 2020-11-16 DIAGNOSIS — R2681 Unsteadiness on feet: Secondary | ICD-10-CM | POA: Diagnosis not present

## 2020-11-16 DIAGNOSIS — D500 Iron deficiency anemia secondary to blood loss (chronic): Secondary | ICD-10-CM | POA: Diagnosis not present

## 2020-11-16 DIAGNOSIS — Z7401 Bed confinement status: Secondary | ICD-10-CM | POA: Diagnosis not present

## 2020-11-16 DIAGNOSIS — Z794 Long term (current) use of insulin: Secondary | ICD-10-CM | POA: Diagnosis not present

## 2020-11-16 DIAGNOSIS — K862 Cyst of pancreas: Secondary | ICD-10-CM | POA: Diagnosis not present

## 2020-11-16 DIAGNOSIS — K625 Hemorrhage of anus and rectum: Secondary | ICD-10-CM | POA: Diagnosis not present

## 2020-11-16 DIAGNOSIS — G4730 Sleep apnea, unspecified: Secondary | ICD-10-CM | POA: Diagnosis not present

## 2020-11-16 DIAGNOSIS — D649 Anemia, unspecified: Secondary | ICD-10-CM | POA: Diagnosis not present

## 2020-11-16 DIAGNOSIS — K922 Gastrointestinal hemorrhage, unspecified: Secondary | ICD-10-CM | POA: Diagnosis not present

## 2020-11-16 DIAGNOSIS — R197 Diarrhea, unspecified: Secondary | ICD-10-CM | POA: Diagnosis not present

## 2020-11-16 DIAGNOSIS — D62 Acute posthemorrhagic anemia: Secondary | ICD-10-CM | POA: Diagnosis not present

## 2020-11-16 DIAGNOSIS — Z803 Family history of malignant neoplasm of breast: Secondary | ICD-10-CM | POA: Diagnosis not present

## 2020-11-16 DIAGNOSIS — R531 Weakness: Secondary | ICD-10-CM | POA: Diagnosis not present

## 2020-11-16 DIAGNOSIS — Z7951 Long term (current) use of inhaled steroids: Secondary | ICD-10-CM | POA: Diagnosis not present

## 2020-11-16 DIAGNOSIS — M25562 Pain in left knee: Secondary | ICD-10-CM | POA: Diagnosis not present

## 2020-11-16 DIAGNOSIS — D631 Anemia in chronic kidney disease: Secondary | ICD-10-CM | POA: Diagnosis not present

## 2020-11-16 DIAGNOSIS — D638 Anemia in other chronic diseases classified elsewhere: Secondary | ICD-10-CM | POA: Diagnosis not present

## 2020-11-16 DIAGNOSIS — E1122 Type 2 diabetes mellitus with diabetic chronic kidney disease: Secondary | ICD-10-CM | POA: Diagnosis not present

## 2020-11-16 LAB — CBC
HCT: 29.4 % — ABNORMAL LOW (ref 36.0–46.0)
Hemoglobin: 8.6 g/dL — ABNORMAL LOW (ref 12.0–15.0)
MCH: 22.6 pg — ABNORMAL LOW (ref 26.0–34.0)
MCHC: 29.3 g/dL — ABNORMAL LOW (ref 30.0–36.0)
MCV: 77.4 fL — ABNORMAL LOW (ref 80.0–100.0)
Platelets: 408 10*3/uL — ABNORMAL HIGH (ref 150–400)
RBC: 3.8 MIL/uL — ABNORMAL LOW (ref 3.87–5.11)
RDW: 21.4 % — ABNORMAL HIGH (ref 11.5–15.5)
WBC: 10.4 10*3/uL (ref 4.0–10.5)
nRBC: 0 % (ref 0.0–0.2)

## 2020-11-16 LAB — GLUCOSE, CAPILLARY
Glucose-Capillary: 113 mg/dL — ABNORMAL HIGH (ref 70–99)
Glucose-Capillary: 146 mg/dL — ABNORMAL HIGH (ref 70–99)
Glucose-Capillary: 173 mg/dL — ABNORMAL HIGH (ref 70–99)

## 2020-11-16 LAB — PROTEIN ELECTROPHORESIS, SERUM
A/G Ratio: 1 (ref 0.7–1.7)
Albumin ELP: 2.1 g/dL — ABNORMAL LOW (ref 2.9–4.4)
Alpha-1-Globulin: 0.3 g/dL (ref 0.0–0.4)
Alpha-2-Globulin: 0.7 g/dL (ref 0.4–1.0)
Beta Globulin: 0.7 g/dL (ref 0.7–1.3)
Gamma Globulin: 0.6 g/dL (ref 0.4–1.8)
Globulin, Total: 2.2 g/dL (ref 2.2–3.9)
Total Protein ELP: 4.3 g/dL — ABNORMAL LOW (ref 6.0–8.5)

## 2020-11-16 MED ORDER — PSYLLIUM 58.6 % PO PACK: 1.0000 | PACK | Freq: Every day | ORAL | 12 refills | Status: AC

## 2020-11-16 MED ORDER — KETOROLAC TROMETHAMINE 0.5 % OP SOLN: 1.0000 [drp] | Freq: Four times a day (QID) | OPHTHALMIC | 0 refills | Status: DC

## 2020-11-16 NOTE — Progress Notes (Signed)
Physical Therapy Treatment Patient Details Name: [name] MRN: [id number] DOB: [birth year] Today's Date: 11/16/2020    History of Present Illness Pt is a 80yo female presenting [facility] ED on 8/30 after a fall out of bed, laying on floor for 5+ hours. Imaging negative for acute findings.  Pt was going home on  PMH: recent hospital 8/19-22 for GI bleed,  PE 07/2020, asthma, DM, fibromyalgia, RA, sleep apnea.    PT Comments    Pt admitted with above diagnosis. Pt was able to sit EOB for 10 min but refused to get OOB.  Pt was able to take side steps to [facility] with mod assist of 2.  PErformed some exercises as well.  Pt currently with functional limitations due to balance and endurance deficits. Pt is self limiting.  Pt will benefit from skilled PT to increase their independence and safety with mobility to allow discharge to the venue listed below.      Follow Up Recommendations  SNF;Supervision/Assistance - 24 hour     Equipment Recommendations  Other (comment);Hospital bed (hoyer lift with pad)    Recommendations for Other Services       Precautions / Restrictions Precautions Precautions: Fall Precaution Comments: Recent fall, generalized weakness Restrictions Weight Bearing Restrictions: No    Mobility  Bed Mobility Overal bed mobility: Needs Assistance Bed Mobility: Supine to Sit;Sit to Supine     Supine to sit: +2 for physical assistance;Mod assist Sit to supine: Mod assist;+2 for physical assistance   General bed mobility comments: Pt required mod assist +2 for physical assist for LE advancement off bed and trunk control to acheive upright sitting.    Transfers Overall transfer level: Needs assistance Equipment used: 2 person hand held assist Transfers: Sit to/from Stand Sit to Stand: Max assist;+2 physical assistance;From elevated surface;Mod assist         General transfer comment: Sit to stand transfer with max assist +2 with pt able to take  a few steps to [facility] side  stepping. Pt with flexed posture and maintained hip and knee flexion. Pt refused to get to recliner.  Washed pts back as well.  Ambulation/Gait                 Stairs             Wheelchair Mobility    Modified Rankin (Stroke Patients Only)       Balance Overall balance assessment: Needs assistance Sitting-balance support: No upper extremity supported;Feet supported Sitting balance-Leahy Scale: Fair Sitting balance - Comments: can sit EOB on her own.   Standing balance support: Bilateral upper extremity supported;During functional activity Standing balance-Leahy Scale: Poor Standing balance comment: relies on UE support for balance                            Cognition Arousal/Alertness: Awake/alert Behavior During Therapy: Anxious Overall Cognitive Status: No family/caregiver present to determine baseline cognitive functioning                                 General Comments: Decreased safety awareness and awareness of deficits. Kept stating "I need to rest".      Exercises General Exercises - Lower Extremity Ankle Circles/Pumps: AROM;Both;10 reps;Supine Long Arc Quad: AROM;Both;10 reps;Seated    General Comments        Pertinent Vitals/Pain Pain Assessment: Faces Faces Pain Scale: Hurts little  more Pain Location: bilateral knees Pain Descriptors / Indicators: Grimacing;Guarding;Moaning Pain Intervention(s): Limited activity within patient's tolerance;Monitored during session;Repositioned    Home Living                      Prior Function            PT Goals (current goals can now be found in the care plan section) Progress towards PT goals: Progressing toward goals    Frequency    Min 2X/week      PT Plan Current plan remains appropriate    Co-evaluation              AM-PAC PT "6 Clicks" Mobility   Outcome Measure  Help needed turning from your back to your side while in a flat bed without  using bedrails?: A Lot Help needed moving from lying on your back to sitting on the side of a flat bed without using bedrails?: Total Help needed moving to and from a bed to a chair (including a wheelchair)?: Total Help needed standing up from a chair using your arms (e.g., wheelchair or bedside chair)?: Total Help needed to walk in hospital room?: Total Help needed climbing 3-5 steps with a railing? : Total 6 Click Score: 7    End of Session Equipment Utilized During Treatment: Gait belt Activity Tolerance: Patient limited by pain;Patient limited by fatigue Patient left: with call bell/phone within reach;in bed;with bed alarm set Nurse Communication: Mobility status PT Visit Diagnosis: Unsteadiness on feet (R26.81);Muscle weakness (generalized) (M62.81);History of falling (Z91.81);Difficulty in walking, not elsewhere classified (R26.2)     Time: [id number] PT Time Calculation (min) (ACUTE ONLY): 20 min  Charges:  $Therapeutic Activity: 8-22 mins                     [name] M,PT Acute Rehab Services [phone] (pager)    [name] 11/16/2020, 1:57 PM

## 2020-11-16 NOTE — TOC Transition Note (Signed)
Transition of Care [facility]) - CM/SW Discharge Note   Patient Details  Name: [name] MRN: [id number] Date of Birth: Oct 14, 1940  Transition of Care [facility]) CM/SW Contact:  Emeterio Reeve, LCSW Phone Number: 11/16/2020, 11:45 AM   Clinical Narrative:     Patient will DC to: [facility]  Anticipated DC date: 11/16/20 Family notified: Pt will notify Transport by: Corey Harold     Per MD patient ready for DC to [facility]. RN, patient, patient's family, and facility notified of DC. Discharge Summary and FL2 sent to facility. DC packet on chart. Insurance Josem Kaufmann has been received and pt is covid negative. Ambulance transport requested for patient.    RN to call report to [phone]. Pt will go to room 509.  CSW will sign off for now as social work intervention is no longer needed. Please consult [place] again if new needs arise.   Final next level of care: Skilled Nursing Facility Barriers to Discharge: Barriers Resolved   Patient Goals and CMS Choice Patient states their goals for this hospitalization and ongoing recovery are:: to get stronger CMS Medicare.gov Compare Post Acute Care list provided to:: Patient Choice offered to / list presented to : Patient  Discharge Placement              Patient [facility] bed [facility] Patient to be transferred to facility by: [facility] Name of family member notified: Pt will notify family Patient and family notified of of transfer: 11/16/20  Discharge Plan and Services                                     Social Determinants of Health (SDOH) Interventions     Readmission Risk Interventions No flowsheet data found.   Emeterio Reeve, LCSW Clinical Social Worker

## 2020-11-16 NOTE — Discharge Summary (Signed)
[facility] Discharge Summary   Patient: [name] [id number]  PCP: [name], MD  Date of admission: 11/07/2020   Date of discharge:  11/16/2020     Discharge Diagnoses:  Principal Problem:   Rectal bleeding due to acute on chronic blood loss from rectal prolapse and anticoagulation.   Active Problems:   Rheumatoid arthritis ([place])   Chronic kidney disease (CKD), stage III (moderate) (HCC)   DM type 2, not at goal [facility])   Pancreatic cyst   Acute encephalopathy   Acute GI bleeding  Admitted From: home Disposition:  SNF   Recommendations for Outpatient Follow-up:  PCP: follow up in 1 week with CBC and BMP Follow up with Gastroenterology in 3-4 weeks with Dr Alessandra Bevels Follow up with Hematology, follow up on results of [place] biopsy Establish care with General surgery and consider treatment option for rectal prolapse  Follow up LABS/TEST:  CBC and BMP in 1 week,    Follow-up Information     [name], MD. Schedule an appointment as soon as possible for a visit .   Specialty: Internal Medicine Contact information: [address] [place] [address] [phone]         Benay Pike, MD Follow up.   Specialty: Hematology and Oncology Why: office will call you Contact information: [place] [address] [phone]                Discharge Instructions     Ambulatory referral to General Surgery   Complete by: As directed    Ambulatory referral to Hematology / Oncology   Complete by: As directed    Diet - low sodium heart healthy   Complete by: As directed    Discharge instructions   Complete by: As directed    Contact a health care provider if:  You develop new bleeding anywhere in the body. Get help right away if:  You are very weak.  You are short of breath.  You have pain in your abdomen or chest.  You are dizzy or feel faint.  You have trouble concentrating.  You have bloody stools, black stools, or  tarry stools.  You vomit repeatedly or you vomit up blood.   Increase activity slowly   Complete by: As directed        Diet recommendation: Regular diet  Activity: The patient is advised to gradually reintroduce usual activities, as tolerated  Discharge Condition: stable  Code Status: Full code   History of present illness: As per the H and P dictated on admission, "[name] is a 80 y.o. 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  Patient did hit her head.  She was laying on the floor for 5 to 6 hours until patient's son came to check on and was brought to the ER.    ED Course: Patient had CT head which did not show anything acute.  And patient was cleared for discharge back to home when patient was waiting to be discharged back home patient became confused and hypotensive and started having rectal bleeding.  Patient also had a temperature of 100.4 F.  Chest x-ray UA was unremarkable.  Patient's blood pressure was improving with fluids.  Repeat CBC showed hemoglobin dropped from 8-5.3.  Patient was ordered 2 units of PRBC transfusion fluid bolus admitted for acute GI bleed.  COVID test is pending."  Hospital Course:  Pt had recent admission 8/19-- 8/22 acute blood loss anemia - underwent capsule endoscopy- small bowel erosions, small bowel AVM- -Eliquis was discontinued that hospital stay after discussion pulmonary as CT chest showed resolution of thrombus. 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   Presented to emergency room 11/07/2020 with a fall from bed. In Ed  cleared for discharge home and was waiting to be sent home, became confused, hypotensive started having rectal bleeding with a fever 100.4 hemoglobin dropped from 8--->5.3 patient was ordered 2 units PRBC.   Underwent nonrevealing small bowel enteroscopy on 9/3  Summary of her active problems in the hospital is as following.  Acute on chronic blood loss anemia History of rectal prolapse  Status post 2 units PRBC 9/1. Switch Protonix to p.o. twice daily- continue soft diet by GI gave IV iron 9/4 Per gastroenterology would start on MiraLAX and refer as an outpatient to surgery for prolapsed rectum, pt refused miralax so will switch to metamucil. Resume ferrous gluconate q. OD on discharge Discussed with hematology Dr. Eula Fried in unlikely her anemia 2/2 hemolysis--could be bleed superimposed on chr Iron def anemia--CT BM biopsy prior to d/c  Prior pulmonary embolism-Eliquis d/c 10/30/20 CT last admit not revealing for PE--Hold further DOAC at this time  Rh Arthritis on Prednisone as OP stress dosing Cortef 50 q 12 on admit and now back to prednisone 5 daily blood pressures are stable  DM TY 2 Home med metformin 500 twice daily, 70/30 insulin 10 twice daily held at this time  OSA not on BiPAP  COPD--not oxygen dependent priro to admit May continue albuterol 6 as needed Brovana 2 puffs twice daily Pulmicort 0.25 twice daily DuoNeb every 6 Steroids as above  Depression/anxiety Can continue Cymbalta 60 hs  Eye itching Ketorolac eye drops No evidence of infection  Pain control  [place] Controlled Substance Reporting System database was reviewed. 5 day supply was provided. Patient was instructed, not to drive, operate heavy machinery, perform activities at heights, swimming or participation in water activities or provide baby sitting services while on Pain, Sleep and Anxiety Medications; until her outpatient Physician has advised to do so again.  - Also recommended to not to  take more than prescribed Pain, Sleep and Anxiety Medications.  Patient was seen by physical therapy, who recommended SNF. On the day of the discharge the patient's vitals were stable, and no other new acute medical condition were reported. The patient was felt safe to be discharge at SNF with Therapy.  Consultants: Gastroenterology , hematology Procedures: small bowel push enteroscopy   DISCHARGE MEDICATION: Allergies as of 11/16/2020       Reactions   Penicillins Hives   Has patient had a PCN reaction causing immediate rash, facial/tongue/throat swelling, SOB or lightheadedness with hypotension: Yes Has patient had a PCN reaction causing severe rash involving mucus membranes or skin necrosis: No Has patient had a PCN reaction that required hospitalization: No Has patient had a PCN reaction occurring within the last 10 years: No If all of the above answers are "NO", then may proceed with Cephalosporin use. Patient has tolerated ceftriaxone in the recent past.   Sulfamethoxazole-trimethoprim Nausea And Vomiting,  Other (See Comments)   Stomach problems   Adalimumab Other (See Comments)   "Not effective"   Hydroxychloroquine Sulfate Other (See Comments)   "Plaquenil did not work after [employer]"   Meperidine Other (See Comments)   Cramping   Meperidine Hcl Other (See Comments)   Cramping   Methotrexate Nausea And Vomiting, Other (See Comments)   "Projectile vomiting"   Penicillin G Sodium Hives   Phentermine-topiramate Nausea Only, Other (See Comments)   "Nausea and dizziness & on re-trial, mental side effects so really cannot take it   Sulfa Antibiotics Nausea And Vomiting, Other (See Comments)   Stomach problems/GI upset, also   Sulfonamide Derivatives Nausea And Vomiting, Other (See Comments)   "Stomach problems"        Medication List     STOP taking these medications    albuterol 108 (90 Base) MCG/ACT inhaler Commonly known as: VENTOLIN HFA   olopatadine 0.1 %  ophthalmic solution Commonly known as: PATANOL       TAKE these medications    acetaminophen 500 MG tablet Commonly known as: TYLENOL Take 1,000 mg by mouth every 6 (six) hours as needed for mild pain (or headaches).   arformoterol 15 MCG/2ML Nebu Commonly known as: BROVANA Take 15 mcg by nebulization 2 (two) times daily as needed (for shortness of breath).   atorvastatin 20 MG tablet Commonly known as: LIPITOR Take 20 mg by mouth daily.   B-12 2500 MCG Tabs Take 2,500 mcg by mouth at bedtime.   B-D UF III MINI PEN NEEDLES 31G X 5 MM Misc Generic drug: Insulin Pen Needle SMARTSIG:1 Each SUB-Q 4 Times Daily   budesonide 0.25 MG/2ML nebulizer solution Commonly known as: PULMICORT Take 2 mLs (0.25 mg total) by nebulization 2 (two) times daily.   DULoxetine 60 MG capsule Commonly known as: CYMBALTA Take 60 mg by mouth at bedtime.   ezetimibe 10 MG tablet Commonly known as: ZETIA Take 10 mg by mouth at bedtime.   ferrous gluconate 324 MG tablet Commonly known as: FERGON Take 324 mg by mouth every other day.   ipratropium-albuterol 0.5-2.5 (3) MG/3ML Soln Commonly known as: DUONEB Take 3 mLs by nebulization every 6 (six) hours as needed (wheezing).   ketorolac 0.5 % ophthalmic solution Commonly known as: ACULAR Place 1 drop into both eyes 4 (four) times daily.   metFORMIN 500 MG tablet Commonly known as: GLUCOPHAGE Take 500 mg by mouth 2 (two) times daily.   NovoLIN 70/30 Kwikpen (70-30) 100 UNIT/ML KwikPen Generic drug: insulin isophane & regular human KwikPen Inject 10 Units into the skin 2 (two) times daily.   ondansetron 8 MG disintegrating tablet Commonly known as: ZOFRAN-ODT Take 8 mg by mouth every 8 (eight) hours as needed for nausea or vomiting (dissolve orally).   oxyCODONE 5 MG immediate release tablet Commonly known as: Oxy IR/ROXICODONE Take 1 tablet (5 mg total) by mouth every 6 (six) hours as needed for up to 20 doses for severe pain.    pantoprazole 40 MG tablet Commonly known as: Protonix Take one tablet once every morning What changed:  how much to take how to take this when to take this additional instructions   predniSONE 5 MG tablet Commonly known as: DELTASONE Take 5 mg by mouth at bedtime.   tolterodine 2 MG 24 hr capsule Commonly known as: DETROL LA Take 2 mg by mouth every evening.        Discharge Exam: Filed Weights   11/07/20 1812 11/15/20 2100  Weight:  67.1 kg 70 kg   Vitals:   11/16/20 0539 11/16/20 0753  BP: 136/75 (!) 146/78  Pulse: 89 85  Resp: 20 19  Temp: 97.7 F (36.5 C) 97.7 F (36.5 C)  SpO2: 97% 93%   General: Appear in mild distress, no Rash; Oral Mucosa Clear, moist. no Abnormal Neck Mass Or lumps, Conjunctiva normal  Cardiovascular: S1 and S2 Present, no Murmur, Respiratory: good respiratory effort, Bilateral Air entry present and CTA, no Crackles, no wheezes Abdomen: Bowel Sound present, Soft and no tenderness Extremities: no Pedal edema Neurology: alert and oriented to time, place, and person affect appropriate. no new focal deficit Gait not checked due to patient safety concerns  The results of significant diagnostics from this hospitalization (including imaging, microbiology, ancillary and laboratory) are listed below for reference.    Significant Diagnostic Studies: DG Chest 1 View  Result Date: 11/07/2020 CLINICAL DATA:  Fall out of bed tonight. EXAM: CHEST  1 VIEW COMPARISON:  October 27, 2020. FINDINGS: Stable cardiomediastinal silhouette. Both lungs are clear. The visualized skeletal structures are unremarkable. IMPRESSION: No active disease. Electronically Signed   By: Marijo Conception M.D.   On: 11/07/2020 20:05   DG Pelvis 1-2 Views  Result Date: 11/07/2020 CLINICAL DATA:  Bilateral hip pain after fall out of bed tonight. EXAM: PELVIS - 1-2 VIEW COMPARISON:  None. FINDINGS: There is no evidence of pelvic fracture or diastasis. No pelvic bone lesions are  seen. IMPRESSION: Negative. Electronically Signed   By: Marijo Conception M.D.   On: 11/07/2020 20:06   CT HEAD WO CONTRAST (5MM)  Result Date: 11/09/2020 CLINICAL DATA:  Mental status change.  Unknown cause EXAM: CT HEAD WITHOUT CONTRAST TECHNIQUE: Contiguous axial images were obtained from the base of the skull through the vertex without intravenous contrast. COMPARISON:  CT head 11/07/2020 BRAIN: BRAIN Cerebral ventricle sizes are concordant with the degree of cerebral volume loss. Patchy and confluent areas of decreased attenuation are noted throughout the deep and periventricular white matter of the cerebral hemispheres bilaterally, compatible with chronic microvascular ischemic disease. No evidence of large-territorial acute infarction. No parenchymal hemorrhage. No mass lesion. No extra-axial collection. No mass effect or midline shift. No hydrocephalus. Basilar cisterns are patent. Vascular: No hyperdense vessel. Atherosclerotic calcifications are present within the cavernous internal carotid and vertebral arteries. Skull: No acute fracture or focal lesion. Sinuses/Orbits: Paranasal sinuses and mastoid air cells are clear. Bilateral lens replacement. Otherwise the orbits are unremarkable. Other: None. IMPRESSION: No acute intracranial abnormality. Electronically Signed   By: Iven Finn M.D.   On: 11/09/2020 20:21   CT HEAD WO CONTRAST (5MM)  Result Date: 11/07/2020 CLINICAL DATA:  Head injury after fall out of bed. EXAM: CT HEAD WITHOUT CONTRAST CT CERVICAL SPINE WITHOUT CONTRAST TECHNIQUE: Multidetector CT imaging of the head and cervical spine was performed following the standard protocol without intravenous contrast. Multiplanar CT image reconstructions of the cervical spine were also generated. COMPARISON:  Jul 24, 2020. FINDINGS: CT HEAD FINDINGS Brain: Mild chronic ischemic white matter disease is noted. No mass effect or midline shift is noted. Ventricular size is within normal limits. There  is no evidence of mass lesion, hemorrhage or acute infarction. Vascular: No hyperdense vessel or unexpected calcification. Skull: Normal. Negative for fracture or focal lesion. Sinuses/Orbits: No acute finding. Other: None. CT CERVICAL SPINE FINDINGS Alignment: Minimal grade 1 retrolisthesis of C2-3 is noted secondary to moderate degenerative disc disease at this level. Mild grade 1 anterolisthesis is noted at C4-5  and C5-6 and C6-7 secondary to posterior facet joint hypertrophy. Skull base and vertebrae: No acute fracture. No primary bone lesion or focal pathologic process. Soft tissues and spinal canal: No prevertebral fluid or swelling. No visible canal hematoma. Disc levels: Moderate degenerative disc disease is noted at C4-5, C5-6 and C6-7. Upper chest: Negative. Other: None. IMPRESSION: No acute intracranial abnormality seen. Moderate multilevel degenerative disc disease. No acute abnormality seen in the cervical spine. Electronically Signed   By: Marijo Conception M.D.   On: 11/07/2020 20:15   CT Cervical Spine Wo Contrast  Result Date: 11/07/2020 CLINICAL DATA:  Head injury after fall out of bed. EXAM: CT HEAD WITHOUT CONTRAST CT CERVICAL SPINE WITHOUT CONTRAST TECHNIQUE: Multidetector CT imaging of the head and cervical spine was performed following the standard protocol without intravenous contrast. Multiplanar CT image reconstructions of the cervical spine were also generated. COMPARISON:  Jul 24, 2020. FINDINGS: CT HEAD FINDINGS Brain: Mild chronic ischemic white matter disease is noted. No mass effect or midline shift is noted. Ventricular size is within normal limits. There is no evidence of mass lesion, hemorrhage or acute infarction. Vascular: No hyperdense vessel or unexpected calcification. Skull: Normal. Negative for fracture or focal lesion. Sinuses/Orbits: No acute finding. Other: None. CT CERVICAL SPINE FINDINGS Alignment: Minimal grade 1 retrolisthesis of C2-3 is noted secondary to moderate  degenerative disc disease at this level. Mild grade 1 anterolisthesis is noted at C4-5 and C5-6 and C6-7 secondary to posterior facet joint hypertrophy. Skull base and vertebrae: No acute fracture. No primary bone lesion or focal pathologic process. Soft tissues and spinal canal: No prevertebral fluid or swelling. No visible canal hematoma. Disc levels: Moderate degenerative disc disease is noted at C4-5, C5-6 and C6-7. Upper chest: Negative. Other: None. IMPRESSION: No acute intracranial abnormality seen. Moderate multilevel degenerative disc disease. No acute abnormality seen in the cervical spine. Electronically Signed   By: Marijo Conception M.D.   On: 11/07/2020 20:15   CT Thoracic Spine Wo Contrast  Result Date: 11/07/2020 CLINICAL DATA:  Mid-back pain EXAM: CT THORACIC SPINE WITHOUT CONTRAST TECHNIQUE: Multidetector CT images of the thoracic were obtained using the standard protocol without intravenous contrast. COMPARISON:  None. FINDINGS: Alignment: No substantial sagittal subluxation in the thoracic spine. Mild S-shaped thoracic curvature. Vertebrae: Vertebral body heights are maintained. No evidence of acute fracture Paraspinal and other soft tissues: Partially imaged layering small bilateral pleural effusions with overlying opacities. Disc levels: Multilevel degenerative disc disease, greatest and moderate at T5-T6. IMPRESSION: 1. No evidence of acute fracture or traumatic malalignment. 2. Multilevel degenerative disc disease, greatest and moderate at T5-T6. 3. Partially imaged layering small bilateral pleural effusions with overlying opacities. Electronically Signed   By: Margaretha Sheffield M.D.   On: 11/07/2020 20:04   CT Lumbar Spine Wo Contrast  Result Date: 11/07/2020 CLINICAL DATA:  Low back pain, trauma EXAM: CT LUMBAR SPINE WITHOUT CONTRAST TECHNIQUE: Multidetector CT imaging of the lumbar spine was performed without intravenous contrast administration. Multiplanar CT image reconstructions  were also generated. COMPARISON:  CT abdomen/pelvis 10/27/2020. FINDINGS: Segmentation: 5 lumbar type vertebral bodies. Alignment: Similar alignment. Slight retrolisthesis of L5-S1.Dextrocurvature, centered at L3-L4. Vertebrae: Vertebral body heights are maintained. No evidence of acute fracture. Osteopenia. Paraspinal and other soft tissues: Aorto bi-iliac calcific atherosclerosis. Disc levels: Severe multilevel degenerative disc disease with disc height loss, endplate sclerosis vacuum disc phenomenon and posterior disc/osteophyte complexes. Multilevel facet arthropathy. Resulting multilevel foraminal stenosis, greatest and likely severe on the right at L5-S1.  Multilevel disc bulges, including right inferiorly dissecting paracentral disc protrusion at L1-L2 that narrows the right subarticular recess. IMPRESSION: 1. No evidence of acute fracture or traumatic malalignment. 2. Severe multilevel degenerative disease with multilevel disc bulges, including right inferiorly dissecting paracentral disc protrusion at L1-L2. Multilevel foraminal stenosis, greatest and likely severe on the right at L5-S1. MRI of the lumbar spine could further characterize the canal and foramina if clinically indicated. 3. Dextrocurvature, centered at L3-L4. Electronically Signed   By: Margaretha Sheffield M.D.   On: 11/07/2020 20:17   CT Abdomen Pelvis W Contrast  Result Date: 10/27/2020 CLINICAL DATA:  Generalized abdominal pain.  Vomiting.  Diarrhea. EXAM: CT ABDOMEN AND PELVIS WITH CONTRAST TECHNIQUE: Multidetector CT imaging of the abdomen and pelvis was performed using the standard protocol following bolus administration of intravenous contrast. CONTRAST:  44m OMNIPAQUE IOHEXOL 350 MG/ML SOLN COMPARISON:  10/10/2020 FINDINGS: Lower Chest: No acute findings. Mild bilateral lower lobe bronchiectasis and pleural-parenchymal scarring. Hepatobiliary: No hepatic masses identified. Gallbladder is unremarkable. No evidence of biliary ductal  dilatation. Pancreas: 11 mm simple appearing cyst in the pancreatic neck remains stable. No evidence of pancreatic ductal dilatation or peripancreatic inflammatory changes. Spleen: Within normal limits in size. Splenic cysts remain stable, largest measuring 5 cm. Adrenals/Urinary Tract: A few tiny sub-cm renal cysts are again noted. No masses identified. No evidence of ureteral calculi or hydronephrosis. Unremarkable unopacified urinary bladder. Stomach/Bowel: No evidence of obstruction, inflammatory process or abnormal fluid collections. Normal appendix visualized. Diverticulosis is seen mainly involving the sigmoid colon, however there is no evidence of diverticulitis. Vascular/Lymphatic: No pathologically enlarged lymph nodes. No acute vascular findings. Aortic atherosclerotic calcification noted. Reproductive: Prior hysterectomy noted. Adnexal regions are unremarkable in appearance. Other: Stable small bilateral inguinal hernias which contain only fat. Musculoskeletal: No suspicious bone lesions identified. Severe degenerative disc disease noted at L2-3, L3-4, and L4-5. IMPRESSION: No acute findings within the abdomen or pelvis. Colonic diverticulosis, without radiographic evidence of diverticulitis. Stable 11 mm simple appearing cyst in pancreatic neck. Recommend continued imaging follow-up in 2 years, preferably with abdomen MRI without and with contrast. This recommendation follows ACR consensus guidelines: Management of Incidental Pancreatic Cysts: A White Paper of the ACR Incidental Findings Committee. JDeaf [id number] Aortic Atherosclerosis (ICD10-I70.0). Electronically Signed   By: JMarlaine HindM.D.   On: 10/27/2020 17:30   DG Chest Port 1 View  Result Date: 11/09/2020 CLINICAL DATA:  Fever EXAM: PORTABLE CHEST 1 VIEW COMPARISON:  11/07/2020 FINDINGS: Stable cardiomediastinal contours. Small left pleural effusion. No focal airspace consolidation. No pneumothorax. No acute bony  findings. Degenerative changes of the bilateral shoulders. IMPRESSION: Small left pleural effusion. Electronically Signed   By: NDavina PokeD.O.   On: 11/09/2020 18:27   DG Chest Portable 1 View  Result Date: 10/27/2020 CLINICAL DATA:  Cough, weakness and generalized pain. Vomiting for 4 days. EXAM: PORTABLE CHEST 1 VIEW COMPARISON:  October 10, 2020. FINDINGS: EKG leads project over the chest. Cardiomediastinal contours are stable likely with mild cardiac enlargement. Central pulmonary vascular congestion is a stable finding without signs of edema, consolidation or evidence of pleural effusion. No visible pneumothorax. On limited assessment no acute skeletal process. IMPRESSION: Stable mild cardiac enlargement and central pulmonary vascular congestion without acute process. Electronically Signed   By: GZetta BillsM.D.   On: 10/27/2020 14:54   DG Knee Complete 4 Views Left  Result Date: 11/07/2020 CLINICAL DATA:  Trauma to the left knee. 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 The arthroplasty appear intact in anatomic alignment. No evidence of loosening. There is no acute fracture or dislocation. Old healed fracture of proximal fibula. Bones are osteopenic. Soft tissues are unremarkable. No joint effusion. IMPRESSION: 1. No acute fracture or dislocation. 2. Total left knee arthroplasty appears intact and in anatomic alignment. Electronically Signed   By: [name] M.D.   On: 11/07/2020 20:02   DG Knee Complete 4 Views Right  Result Date: 11/07/2020 CLINICAL DATA:  Bilateral knee pain after fall out of bed tonight. EXAM: RIGHT KNEE - COMPLETE 4+ VIEW COMPARISON:  March 09, 2017. FINDINGS: There has been interval revision of right hip arthroplasty with long stem femoral and tibial components. No acute fracture or dislocation is noted. Vascular calcifications are noted. IMPRESSION: Postsurgical  changes as described above. No definite acute abnormality seen. Electronically Signed   By: Marijo Conception M.D.   On: 11/07/2020 20:00   CT BONE MARROW BIOPSY & ASPIRATION  Result Date: 11/15/2020 INDICATION: Anemia. EXAM: CT GUIDED BONE MARROW ASPIRATION AND CORE BIOPSY MEDICATIONS: None. ANESTHESIA/SEDATION: Moderate (conscious) sedation was employed during this procedure. A total of 2 milligrams versed and 100 micrograms fentanyl were administered intravenously. The patient's level of consciousness and vital signs were monitored continuously by radiology nursing throughout the procedure under my direct supervision. Total monitored sedation time: 20 minutes FLUOROSCOPY TIME:  CT dose was not reported. COMPLICATIONS: None immediate. Estimated blood loss: <5 mL PROCEDURE: Informed written consent was obtained from the patient after a thorough discussion of the procedural risks, benefits and alternatives. All questions were addressed. Maximal Sterile Barrier Technique was utilized including caps, mask, sterile gowns, sterile gloves, sterile drape, hand hygiene and skin antiseptic. A timeout was performed prior to the initiation of the procedure. The patient was positioned prone and non-contrast localization CT was performed of the pelvis to demonstrate the iliac marrow spaces. Maximal barrier sterile technique utilized including caps, mask, sterile gowns, sterile gloves, large sterile drape, hand hygiene, and betadine prep. Under sterile conditions and local anesthesia, an 11 gauge coaxial bone biopsy needle was advanced into the RIGHT iliac marrow space. Needle position was confirmed with CT imaging. Initially, bone marrow aspiration was performed. Next, the 11 gauge outer cannula was utilized to obtain a RIGHT iliac bone marrow core biopsy. Needle was removed. Hemostasis was obtained with compression. The patient tolerated the procedure well. Samples were prepared with the cytotechnologist. IMPRESSION:  Successful CT guided bone marrow aspiration and biopsy, as above. Michaelle Birks, MD [facility] Electronically Signed   By: Michaelle Birks M.D.   On: 11/15/2020 11:17    Microbiology: Recent Results (from the past 240 hour(s))  SARS CORONAVIRUS 2 (TAT 6-24 HRS) Nasopharyngeal Nasopharyngeal Swab     Status: None   Collection Time: 11/09/20  9:49 PM   Specimen: Nasopharyngeal Swab  Result Value Ref Range Status   SARS Coronavirus 2 NEGATIVE NEGATIVE Final    Comment: (NOTE) SARS-CoV-2 target nucleic acids are NOT DETECTED.  The SARS-CoV-2 RNA is generally detectable in upper and lower respiratory specimens during the acute phase of infection. Negative results do not preclude SARS-CoV-2 infection, do not rule out co-infections with other pathogens, and should not be used as the sole basis for treatment or other patient management decisions. Negative results must be combined with clinical observations, patient history, and epidemiological information. The expected result is Negative.  Fact Sheet for Patients: SugarRoll.be  Fact Sheet for Healthcare Providers: https://www.woods-mathews.com/  This test is not yet approved or cleared by the [place] FDA and  has been authorized for detection and/or diagnosis of SARS-CoV-2 by FDA under an Emergency Use Authorization (EUA). This EUA will remain  in effect (meaning this test can be used) for the duration of the COVID-19 declaration under Se ction 564(b)(1) of the Act, 21 U.S.C. section 360bbb-3(b)(1), unless the authorization is terminated or revoked sooner.  Performed at [facility], [place] [address] [place], [place] [address]   Culture, blood (routine x 2)     Status: None   Collection Time: 11/09/20 10:00 PM   Specimen: BLOOD  Result Value Ref Range Status   Specimen Description BLOOD BLOOD RIGHT WRIST  Final   Special  Requests   Final    BOTTLES DRAWN AEROBIC AND ANAEROBIC Blood Culture adequate volume   Culture   Final    NO GROWTH 5 DAYS Performed at [facility], [place] [address], [place], [place] [address]    Report Status 11/14/2020 FINAL  Final  Culture, blood (routine x 2)     Status: None   Collection Time: 11/09/20 10:00 PM   Specimen: BLOOD RIGHT FOREARM  Result Value Ref Range Status   Specimen Description BLOOD RIGHT FOREARM  Final   Special Requests   Final    BOTTLES DRAWN AEROBIC AND ANAEROBIC Blood Culture adequate volume   Culture   Final    NO GROWTH 5 DAYS Performed at [facility], [place] [name], [place], [place] [address]    Report Status 11/14/2020 FINAL  Final  SARS CORONAVIRUS 2 (TAT 6-24 HRS) Nasopharyngeal Nasopharyngeal Swab     Status: None   Collection Time: 11/15/20  4:26 PM   Specimen: Nasopharyngeal Swab  Result Value Ref Range Status   SARS Coronavirus 2 NEGATIVE NEGATIVE Final    Comment: (NOTE) SARS-CoV-2 target nucleic acids are NOT DETECTED.  The SARS-CoV-2 RNA is generally detectable in upper and lower respiratory specimens during the acute phase of infection. Negative results do not preclude SARS-CoV-2 infection, do not rule out co-infections with other pathogens, and should not be used as the sole basis for treatment or other patient management decisions. Negative results must be combined with clinical observations, patient history, and epidemiological information. The expected result is Negative.  Fact Sheet for Patients: SugarRoll.be  Fact Sheet for Healthcare Providers: https://www.woods-mathews.com/  This test is not yet approved or cleared by the [place] FDA and  has been authorized for detection and/or diagnosis of SARS-CoV-2 by FDA under an Emergency Use Authorization (EUA). This EUA will remain  in effect (meaning this test can be used) for the duration of the COVID-19 declaration  under Se ction 564(b)(1) of the Act, 21 U.S.C. section 360bbb-3(b)(1), unless the authorization is terminated or revoked sooner.  Performed at [facility], [place] [address], [place], [place] [address]      Labs: CBC: Recent Labs  Lab 11/11/20 0101 11/12/20 0208 11/13/20 0032 11/14/20 0048 11/16/20 0936  WBC 10.7* 7.8 9.8 10.1 10.4  NEUTROABS  --  5.9 7.2 7.3  --   HGB 7.9* 7.2* 7.5* 8.2* 8.6*  HCT 26.7* 24.6* 26.8* 28.6* 29.4*  MCV 75.9* 75.9* 77.5* 76.5* 77.4*  PLT 342 321 338 354 056*   Basic Metabolic Panel: Recent Labs  Lab 11/10/20 0440 11/11/20 0101 11/13/20 0032  NA 137 137 140  K 4.2  3.9 3.7  CL 106 109 111  CO2 _0 GLUCOSE 169* 222* 148*  BUN _1 CREATININE 0.91 1.06* 0.87  CALCIUM 8.8* 8.4* 8.3*   Liver Function Tests: Recent Labs  Lab 11/10/20 0440 11/11/20 0101 11/13/20 0032  AST 13* 16 40  ALT _2 ALKPHOS 80 80 94  BILITOT 0.5 0.1* 0.1*  PROT 4.7* 4.8* 4.6*  ALBUMIN 1.7* 1.8* 1.9*   CBG: Recent Labs  Lab 11/15/20 1225 11/15/20 1703 11/15/20 2014 11/16/20 0001 11/16/20 0806  GLUCAP 84 153* 158* 173* 146*   Time spent: 35 minutes  Signed:  Berle Mull  [facility] 11/16/2020

## 2020-11-16 NOTE — Progress Notes (Signed)
[name] to be D/C'd  per MD order.  Discussed with the Lauren at [facility].  VSS, Skin clean, dry and intact without evidence of skin break down, no evidence of skin tears noted.  IV catheter discontinued intact. Site without signs and symptoms of complications. Dressing and pressure applied.  An After Visit Summary was printed and given to the patient and PTAR to give the facility once on arrival.  Patient to be escorted via [facility].

## 2020-11-21 ENCOUNTER — Other Ambulatory Visit: Payer: Self-pay | Admitting: *Deleted

## 2020-11-21 NOTE — Patient Outreach (Signed)
[facility]) Care Management  11/21/2020  SAHORY SITTERLY [birth year] [id number]   Case Closure  Pt recent admitted into the hospital and went to [facility] post op discharge.   Case has been closed and provider has been updated on pt's disposition with [facility] services.  [name], RN Care Management Coordinator [place] Office [phone]

## 2020-11-22 ENCOUNTER — Other Ambulatory Visit: Payer: Self-pay | Admitting: *Deleted

## 2020-11-22 ENCOUNTER — Ambulatory Visit: Payer: Medicare Other | Admitting: Hematology and Oncology

## 2020-11-22 ENCOUNTER — Encounter (HOSPITAL_COMMUNITY): Payer: Self-pay | Admitting: Hematology and Oncology

## 2020-11-22 ENCOUNTER — Ambulatory Visit: Payer: Medicare Other | Admitting: *Deleted

## 2020-11-22 NOTE — Patient Outreach (Signed)
Ms. Mcgowan resides in [facility]. She has been active with [facility].   Communication sent to [facility] to make aware writer is following for [facility] re-engagement and to request palliative care consult at [facility].  Will continue to follow for transition plans while member resides in [place].    Marthenia Rolling, MSN, RN,BSN [place] Acute Care Coordinator [phone] [facility]) [phone]  (Toll free office)

## 2020-11-23 ENCOUNTER — Telehealth: Payer: Self-pay | Admitting: Hematology and Oncology

## 2020-11-23 DIAGNOSIS — R5381 Other malaise: Secondary | ICD-10-CM | POA: Diagnosis not present

## 2020-11-23 DIAGNOSIS — K623 Rectal prolapse: Secondary | ICD-10-CM | POA: Diagnosis not present

## 2020-11-23 DIAGNOSIS — Z8719 Personal history of other diseases of the digestive system: Secondary | ICD-10-CM | POA: Diagnosis not present

## 2020-11-23 DIAGNOSIS — D500 Iron deficiency anemia secondary to blood loss (chronic): Secondary | ICD-10-CM | POA: Diagnosis not present

## 2020-11-23 NOTE — Telephone Encounter (Signed)
Scheduled per sch msg. Called and left msg. Left call back number for scheduling as well as transportation if needed

## 2020-11-29 ENCOUNTER — Other Ambulatory Visit: Payer: Self-pay | Admitting: *Deleted

## 2020-11-29 NOTE — Progress Notes (Signed)
Leslie NOTE  Patient Care Team: [name], MD as PCP - General (Internal Medicine) Hallows, Verlee Monte, MD as Consulting Physician (Orthopedic Surgery)  CHIEF COMPLAINTS/PURPOSE OF CONSULTATION:  Anemia.  ASSESSMENT & PLAN:  No problem-specific Assessment & Plan notes found for this encounter.  Orders Placed This Encounter  Procedures   CBC with Differential/Platelet    Standing Status:   Standing    Number of Occurrences:   22    Standing Expiration Date:   11/30/2021   Iron and TIBC    Standing Status:   Future    Number of Occurrences:   1    Standing Expiration Date:   11/30/2021   Ferritin    Standing Status:   Future    Number of Occurrences:   1    Standing Expiration Date:   11/30/2021   Comprehensive metabolic panel    Standing Status:   Standing    Number of Occurrences:   33    Standing Expiration Date:   11/30/2021   Lactate dehydrogenase    Standing Status:   Future    Number of Occurrences:   1    Standing Expiration Date:   11/30/2021   Reticulocytes    Standing Status:   Future    Number of Occurrences:   1    Standing Expiration Date:   11/30/2021   This is a pleasant 80 year old female patient with COPD, prior PE on Eliquis which was discontinued in May 2022, type 2 diabetes, rheumatoid arthritis, chronic prednisone use, obstructive sleep apnea who was seen in the hospital with acute blood loss anemia.  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  While she was in the hospital, hematology was consulted for any concern for primary bone marrow disorder. She had a bone marrow biopsy which did not show any evidence of primary hematological disorder.  Her hemoglobin today is over 10 g/dL which is better than her baseline for the past year or so.  At this time I do believe she has mostly anemia from acute blood loss related to her arteriovenous malformations as well as some anemia of chronic disease from rheumatoid  arthritis.  There is no evidence of primary hematological disorder.  I have tried to explain this multiple times to the patient today , called her daughter went straight to voicemail, left my call back number. At this time there is no further recommendations from hematology.  We will see her 1 more time in about 8 weeks and if her blood counts are looking normal or close to baseline at that time, she can return to hematology as needed.  She needs to establish with PCP who can monitor her labs as well as rheumatology for management of her rheumatoid arthritis. Thank you for consulting [place] in the care of this patient.  Please do not hesitate to contact us with any additional questions or concerns.  HISTORY OF PRESENTING ILLNESS:  [name] 80 y.o. female is here because of Anemia.  This is a 80 year old female patient with past medical history significant for COPD, prior PE Eliquis discontinued in May 2022, type 2 diabetes, rheumatoid arthritis with some major joint deformity on chronic low-dose prednisone, obstructive sleep apnea, recently admitted with acute blood loss anemia.  She had capsule endoscopy which demonstrated small bowel AVM, small bowel erosions and Eliquis was discontinued during that hospital stay.  She then presented with a fall from bed on 11/07/2020  and was found to have a hemoglobin of 5.3 hence received another blood transfusion.   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   Biopsy showed hyperplastic polyps. Colonoscopy showed rectal prolapse, diverticulosis and small tubular adenoma in the descending colon. Capsule endoscopy on Oct 29, 2020 showed few small erosions in small bowel, 1 small AVM in the proximal duodenum with stigmata of recent bleeding. Because she was not thought to have any recent bleed which would explain her hemoglobin of 5.3 for this hospital admission,  hematology was consulted to evaluate for any other underlying hematological process. She has had multiple ER visits and hospital admission recently for various complaints including PE, generalized weakness, blood loss anemia.  At baseline she has multiple medical comorbidities. She had BMB which didn't show evidence of any primary hematological disorder. Although Dr Gari Crown felt there could be hemolysis based on smear findings, her T bili is completely normal, not indicative of hemolysis. On further discussion, he suggested that these could also be autoimmune related changes. She missed her last appointment scheduled with [place].  Today she came with personnel from rehab. She has not been doing very well at the rehab, says she might need help for more time. She is eventually hoping to find some one to help her at home. Appetite is ok, but she doesn't eat much at the rehab, she says food is terrible. No bleeding from her stool that she noticed No recent falls. No change in breathing  Rest of the pertinent 10 point ROS reviewed and neg  MEDICAL HISTORY:  Past Medical History:  Diagnosis Date   Acute pulmonary embolism ([place])    b/l; dx'ed May '22   Asthma    Complication of anesthesia    Anesthesia told her years ago she got too cold during surgery   Diabetes mellitus without complication ([place])    Emphysema    Fibromyalgia    Osteoporosis    Rheumatoid arthritis(714.0)    Sleep apnea    no cpap    SURGICAL HISTORY: Past Surgical History:  Procedure Laterality Date   ABDOMINAL HYSTERECTOMY     ANKLE FRACTURE SURGERY  2007   BREAST SURGERY     mammoplasty and then removed   COLONOSCOPY WITH PROPOFOL N/A 08/22/2020   Procedure: COLONOSCOPY WITH PROPOFOL;  Surgeon: Ronnette Juniper, MD;  Location: [facility];  Service: Gastroenterology;  Laterality: N/A;  If schedule availability permits, would like to add on upper endoscopy as well   ENTEROSCOPY N/A 11/11/2020   Procedure: ENTEROSCOPY;   Surgeon: Arta Silence, MD;  Location[facility] END[facility]COPY;  Service: Endoscopy;  Laterality: N/A;   ESOPHAGOGASTRODUODENOSCOPY (EGD) WITH PROPOFOL N/A 08/22/2020   Procedure: ESOPHAGOGASTRODUODENOSCOPY (EGD) WITH PROPOFOL;  Surgeon: Ronnette Juniper, MD;  Location: [facility] ENDOSCOPY;  Service: Gastroenterology;  Laterality: N/A;   FOOT SURGERY     numerous   GIVENS CAPSULE STUDY N/A 10/28/2020   Procedure: GIVENS CAPSULE STUDY;  Surgeon: Otis Brace, MD;  Location: [facility];  Service: Gastroenterology;  Laterality: N/A;   HOT HEMOSTASIS N/A 08/22/2020   Procedure: HOT HEMOSTASIS (ARGON PLASMA COAGULATION/BICAP);  Surgeon: Ronnette Juniper, MD;  Location: [facility];  Service: Gastroenterology;  Laterality: N/A;   I & D EXTREMITY Right 10/26/2014   Procedure: IRRIGATION AND DEBRIDEMENT RIGHT KNEE ;  Surgeon: Gaynelle Arabian, MD;  Location: [facility];  Service: Orthopedics;  Laterality: Right;   IRRIGATION AND DEBRIDEMENT KNEE  Right 03/09/2017   Procedure: IRRIGATION AND DEBRIDEMENT RIGHT KNEE PRE-PATELLAR BURSA;  Surgeon: Susa Day, MD;  Location: [facility];  Service: Orthopedics;  Laterality: Right;   KNEE BURSECTOMY Right 09/16/2014   Procedure: EXCISON OF PRE PATELLA BURSA RIGHT KNEE ;  Surgeon: Gaynelle Arabian, MD;  Location: [facility];  Service: Orthopedics;  Laterality: Right;   PARTIAL HYSTERECTOMY  1970   POLYPECTOMY  08/22/2020   Procedure: POLYPECTOMY;  Surgeon: Ronnette Juniper, MD;  Location: [facility];  Service: Gastroenterology;;   TOTAL KNEE ARTHROPLASTY  2005 / 2006   x2    SOCIAL HISTORY: Social History   Socioeconomic History   Marital status: Divorced    Spouse name: Not on file   Number of children: Not on file   Years of education: Not on file   Highest education level: Not on file  Occupational History   Not on file  Tobacco Use   Smoking status: Former    Types: Cigarettes    Quit date: 03/11/1994    Years since quitting: 26.7   Smokeless tobacco: Never  Vaping Use   Vaping Use:  Never used  Substance and Sexual Activity   Alcohol use: No   Drug use: No   Sexual activity: Not on file  Other Topics Concern   Not on file  Social History Narrative   Not on file   Social Determinants of Health   Financial Resource Strain: Not on file  Food Insecurity: No Food Insecurity   Worried About Charity fundraiser in the Last Year: Never true   Ran Out of Food in the Last Year: Never true  Transportation Needs: Unmet Transportation Needs   Lack of Transportation (Medical): Yes   Lack of Transportation (Non-Medical): Yes  Physical Activity: Not on file  Stress: Not on file  Social Connections: Not on file  Intimate Partner Violence: Not on file    FAMILY HISTORY: Family History  Problem Relation Age of Onset   Hypertension Other    Diabetes Other    Stroke Other    Diabetes Mother    Diabetes Father    Diabetes Sister    Diabetes Maternal Aunt    Cancer Paternal Uncle    Cancer Paternal Grandmother    Breast cancer Paternal Grandmother     ALLERGIES:  is allergic to penicillins, sulfamethoxazole-trimethoprim, adalimumab, hydroxychloroquine sulfate, meperidine, meperidine hcl, methotrexate, penicillin g sodium, phentermine-topiramate, sulfa antibiotics, and sulfonamide derivatives.  MEDICATIONS:  Current Outpatient Medications  Medication Sig Dispense Refill   acetaminophen (TYLENOL) 500 MG tablet Take 1,000 mg by mouth every 6 (six) hours as needed for mild pain (or headaches).     arformoterol (BROVANA) 15 MCG/2ML NEBU Take 15 mcg by nebulization 2 (two) times daily as needed (for shortness of breath).     atorvastatin (LIPITOR) 20 MG tablet Take 20 mg by mouth daily.     B-D UF III MINI PEN NEEDLES 31G X 5 MM MISC SMARTSIG:1 Each SUB-Q 4 Times Daily     budesonide (PULMICORT) 0.25 MG/2ML nebulizer solution Take 2 mLs (0.25 mg total) by nebulization 2 (two) times daily. 120 mL 0   Cyanocobalamin (B-12) 2500 MCG TABS Take 2,500 mcg by mouth at bedtime.       DULoxetine (CYMBALTA) 60 MG capsule Take 60 mg by mouth at bedtime.      ezetimibe (ZETIA) 10 MG tablet Take 10 mg by mouth at bedtime.      ferrous gluconate (FERGON) 324 MG tablet Take 324  mg by mouth every other day.     ipratropium-albuterol (DUONEB) 0.5-2.5 (3) MG/3ML SOLN Take 3 mLs by nebulization every 6 (six) hours as needed (wheezing).     ketorolac (ACULAR) 0.5 % ophthalmic solution Place 1 drop into both eyes 4 (four) times daily. 5 mL 0   loperamide (IMODIUM) 2 MG capsule Take by mouth as needed for diarrhea or loose stools.     metFORMIN (GLUCOPHAGE) 500 MG tablet Take 500 mg by mouth 2 (two) times daily.     NOVOLIN 70/30 KWIKPEN (70-30) 100 UNIT/ML KwikPen Inject 10 Units into the skin 2 (two) times daily.     ondansetron (ZOFRAN-ODT) 8 MG disintegrating tablet Take 8 mg by mouth every 8 (eight) hours as needed for nausea or vomiting (dissolve orally).     pantoprazole (PROTONIX) 40 MG tablet Take one tablet once every morning (Patient taking differently: Take 40 mg by mouth daily before breakfast.) 30 tablet 5   predniSONE (DELTASONE) 5 MG tablet Take 5 mg by mouth at bedtime.     psyllium (METAMUCIL) 58.6 % packet Take 1 packet by mouth daily. 30 each 12   tolterodine (DETROL LA) 2 MG 24 hr capsule Take 2 mg by mouth every evening.     No current facility-administered medications for this visit.   PHYSICAL EXAMINATION: ECOG PERFORMANCE STATUS: 3 - Symptomatic, >50% confined to bed  Vitals:   11/30/20 0849  BP: 112/60  Pulse: 95  Resp: 16  Temp: 98.1 F (36.7 C)  SpO2: 99%   Filed Weights    GENERAL:alert, no distress and comfortable, in a wheel chair SKIN: skin color, texture, turgor are normal, no rashes or significant lesions Neck: no palpable adenopathy Chest: CTA bilaterally Heart: RRR Abdomen: soft, non tender Ext: no edema MSK: Severe RA changes noted.  LABORATORY DATA:  I have reviewed the data as listed Lab Results  Component Value Date   WBC  9.5 11/30/2020   HGB 10.3 (L) 11/30/2020   HCT 35.6 (L) 11/30/2020   MCV 76.6 (L) 11/30/2020   PLT 458 (H) 11/30/2020     Chemistry      Component Value Date/Time   NA 142 11/30/2020 1021   K 4.4 11/30/2020 1021   CL 106 11/30/2020 1021   CO2 24 11/30/2020 1021   BUN 25 (H) 11/30/2020 1021   CREATININE 0.88 11/30/2020 1021   CREATININE 1.16 (H) 04/08/2017 1025      Component Value Date/Time   CALCIUM 9.5 11/30/2020 1021   ALKPHOS 88 11/30/2020 1021   AST 11 (L) 11/30/2020 1021   ALT 6 11/30/2020 1021   BILITOT 0.4 11/30/2020 1021       RADIOGRAPHIC STUDIES: I have personally reviewed the radiological images as listed and agreed with the findings in the report. DG Chest 1 View  Result Date: 11/07/2020 CLINICAL DATA:  Fall out of bed tonight. EXAM: CHEST  1 VIEW COMPARISON:  October 27, 2020. FINDINGS: Stable cardiomediastinal silhouette. Both lungs are clear. The visualized skeletal structures are unremarkable. IMPRESSION: No active disease. Electronically Signed   By: Marijo Conception M.D.   On: 11/07/2020 20:05   DG Pelvis 1-2 Views  Result Date: 11/07/2020 CLINICAL DATA:  Bilateral hip pain after fall out of bed tonight. EXAM: PELVIS - 1-2 VIEW COMPARISON:  None. FINDINGS: There is no evidence of pelvic fracture or diastasis. No pelvic bone lesions are seen. IMPRESSION: Negative. Electronically Signed   By: Marijo Conception M.D.   On: 11/07/2020 20:06  CT HEAD WO CONTRAST (5MM)  Result Date: 11/09/2020 CLINICAL DATA:  Mental status change.  Unknown cause EXAM: CT HEAD WITHOUT CONTRAST TECHNIQUE: Contiguous axial images were obtained from the base of the skull through the vertex without intravenous contrast. COMPARISON:  CT head 11/07/2020 BRAIN: BRAIN Cerebral ventricle sizes are concordant with the degree of cerebral volume loss. Patchy and confluent areas of decreased attenuation are noted throughout the deep and periventricular white matter of the cerebral hemispheres  bilaterally, compatible with chronic microvascular ischemic disease. No evidence of large-territorial acute infarction. No parenchymal hemorrhage. No mass lesion. No extra-axial collection. No mass effect or midline shift. No hydrocephalus. Basilar cisterns are patent. Vascular: No hyperdense vessel. Atherosclerotic calcifications are present within the cavernous internal carotid and vertebral arteries. Skull: No acute fracture or focal lesion. Sinuses/Orbits: Paranasal sinuses and mastoid air cells are clear. Bilateral lens replacement. Otherwise the orbits are unremarkable. Other: None. IMPRESSION: No acute intracranial abnormality. Electronically Signed   By: Iven Finn M.D.   On: 11/09/2020 20:21   CT HEAD WO CONTRAST (5MM)  Result Date: 11/07/2020 CLINICAL DATA:  Head injury after fall out of bed. EXAM: CT HEAD WITHOUT CONTRAST CT CERVICAL SPINE WITHOUT CONTRAST TECHNIQUE: Multidetector CT imaging of the head and cervical spine was performed following the standard protocol without intravenous contrast. Multiplanar CT image reconstructions of the cervical spine were also generated. COMPARISON:  Jul 24, 2020. FINDINGS: CT HEAD FINDINGS Brain: Mild chronic ischemic white matter disease is noted. No mass effect or midline shift is noted. Ventricular size is within normal limits. There is no evidence of mass lesion, hemorrhage or acute infarction. Vascular: No hyperdense vessel or unexpected calcification. Skull: Normal. Negative for fracture or focal lesion. Sinuses/Orbits: No acute finding. Other: None. CT CERVICAL SPINE FINDINGS Alignment: Minimal grade 1 retrolisthesis of C2-3 is noted secondary to moderate degenerative disc disease at this level. Mild grade 1 anterolisthesis is noted at C4-5 and C5-6 and C6-7 secondary to posterior facet joint hypertrophy. Skull base and vertebrae: No acute fracture. No primary bone lesion or focal pathologic process. Soft tissues and spinal canal: No prevertebral  fluid or swelling. No visible canal hematoma. Disc levels: Moderate degenerative disc disease is noted at C4-5, C5-6 and C6-7. Upper chest: Negative. Other: None. IMPRESSION: No acute intracranial abnormality seen. Moderate multilevel degenerative disc disease. No acute abnormality seen in the cervical spine. Electronically Signed   By: Marijo Conception M.D.   On: 11/07/2020 20:15   CT Cervical Spine Wo Contrast  Result Date: 11/07/2020 CLINICAL DATA:  Head injury after fall out of bed. EXAM: CT HEAD WITHOUT CONTRAST CT CERVICAL SPINE WITHOUT CONTRAST TECHNIQUE: Multidetector CT imaging of the head and cervical spine was performed following the standard protocol without intravenous contrast. Multiplanar CT image reconstructions of the cervical spine were also generated. COMPARISON:  Jul 24, 2020. FINDINGS: CT HEAD FINDINGS Brain: Mild chronic ischemic white matter disease is noted. No mass effect or midline shift is noted. Ventricular size is within normal limits. There is no evidence of mass lesion, hemorrhage or acute infarction. Vascular: No hyperdense vessel or unexpected calcification. Skull: Normal. Negative for fracture or focal lesion. Sinuses/Orbits: No acute finding. Other: None. CT CERVICAL SPINE FINDINGS Alignment: Minimal grade 1 retrolisthesis of C2-3 is noted secondary to moderate degenerative disc disease at this level. Mild grade 1 anterolisthesis is noted at C4-5 and C5-6 and C6-7 secondary to posterior facet joint hypertrophy. Skull base and vertebrae: No acute fracture. No primary bone lesion or  focal pathologic process. Soft tissues and spinal canal: No prevertebral fluid or swelling. No visible canal hematoma. Disc levels: Moderate degenerative disc disease is noted at C4-5, C5-6 and C6-7. Upper chest: Negative. Other: None. IMPRESSION: No acute intracranial abnormality seen. Moderate multilevel degenerative disc disease. No acute abnormality seen in the cervical spine. Electronically Signed    By: Marijo Conception M.D.   On: 11/07/2020 20:15   CT Thoracic Spine Wo Contrast  Result Date: 11/07/2020 CLINICAL DATA:  Mid-back pain EXAM: CT THORACIC SPINE WITHOUT CONTRAST TECHNIQUE: Multidetector CT images of the thoracic were obtained using the standard protocol without intravenous contrast. COMPARISON:  None. FINDINGS: Alignment: No substantial sagittal subluxation in the thoracic spine. Mild S-shaped thoracic curvature. Vertebrae: Vertebral body heights are maintained. No evidence of acute fracture Paraspinal and other soft tissues: Partially imaged layering small bilateral pleural effusions with overlying opacities. Disc levels: Multilevel degenerative disc disease, greatest and moderate at T5-T6. IMPRESSION: 1. No evidence of acute fracture or traumatic malalignment. 2. Multilevel degenerative disc disease, greatest and moderate at T5-T6. 3. Partially imaged layering small bilateral pleural effusions with overlying opacities. Electronically Signed   By: Margaretha Sheffield M.D.   On: 11/07/2020 20:04   CT Lumbar Spine Wo Contrast  Result Date: 11/07/2020 CLINICAL DATA:  Low back pain, trauma EXAM: CT LUMBAR SPINE WITHOUT CONTRAST TECHNIQUE: Multidetector CT imaging of the lumbar spine was performed without intravenous contrast administration. Multiplanar CT image reconstructions were also generated. COMPARISON:  CT abdomen/pelvis 10/27/2020. FINDINGS: Segmentation: 5 lumbar type vertebral bodies. Alignment: Similar alignment. Slight retrolisthesis of L5-S1.Dextrocurvature, centered at L3-L4. Vertebrae: Vertebral body heights are maintained. No evidence of acute fracture. Osteopenia. Paraspinal and other soft tissues: Aorto bi-iliac calcific atherosclerosis. Disc levels: Severe multilevel degenerative disc disease with disc height loss, endplate sclerosis vacuum disc phenomenon and posterior disc/osteophyte complexes. Multilevel facet arthropathy. Resulting multilevel foraminal stenosis, greatest  and likely severe on the right at L5-S1. Multilevel disc bulges, including right inferiorly dissecting paracentral disc protrusion at L1-L2 that narrows the right subarticular recess. IMPRESSION: 1. No evidence of acute fracture or traumatic malalignment. 2. Severe multilevel degenerative disease with multilevel disc bulges, including right inferiorly dissecting paracentral disc protrusion at L1-L2. Multilevel foraminal stenosis, greatest and likely severe on the right at L5-S1. MRI of the lumbar spine could further characterize the canal and foramina if clinically indicated. 3. Dextrocurvature, centered at L3-L4. Electronically Signed   By: Margaretha Sheffield M.D.   On: 11/07/2020 20:17   DG Chest Port 1 View  Result Date: 11/09/2020 CLINICAL DATA:  Fever EXAM: PORTABLE CHEST 1 VIEW COMPARISON:  11/07/2020 FINDINGS: Stable cardiomediastinal contours. Small left pleural effusion. No focal airspace consolidation. No pneumothorax. No acute bony findings. Degenerative changes of the bilateral shoulders. IMPRESSION: Small left pleural effusion. Electronically Signed   By: Davina Poke D.O.   On: 11/09/2020 18:27   DG Knee Complete 4 Views Left  Result Date: 11/07/2020 CLINICAL DATA:  Trauma to the left knee. EXAM: LEFT KNEE - COMPLETE 4+ VIEW COMPARISON:  Left knee radiograph dated 06/02/2007 FINDINGS: There is a total left knee arthroplasty. The arthroplasty appear intact in anatomic alignment. No evidence of loosening. There is no acute fracture or dislocation. Old healed fracture of proximal fibula. Bones are osteopenic. Soft tissues are unremarkable. No joint effusion. IMPRESSION: 1. No acute fracture or dislocation. 2. Total left knee arthroplasty appears intact and in anatomic alignment. Electronically Signed   By: [name] M.D.   On: 11/07/2020 20:02  DG Knee Complete 4 Views Right  Result Date: 11/07/2020 CLINICAL DATA:  Bilateral knee pain after fall out of bed tonight. EXAM: RIGHT KNEE -  COMPLETE 4+ VIEW COMPARISON:  March 09, 2017. FINDINGS: There has been interval revision of right hip arthroplasty with long stem femoral and tibial components. No acute fracture or dislocation is noted. Vascular calcifications are noted. IMPRESSION: Postsurgical changes as described above. No definite acute abnormality seen. Electronically Signed   By: Marijo Conception M.D.   On: 11/07/2020 20:00   CT BONE MARROW BIOPSY & ASPIRATION  Result Date: 11/15/2020 INDICATION: Anemia. EXAM: CT GUIDED BONE MARROW ASPIRATION AND CORE BIOPSY MEDICATIONS: None. ANESTHESIA/SEDATION: Moderate (conscious) sedation was employed during this procedure. A total of 2 milligrams versed and 100 micrograms fentanyl were administered intravenously. The patient's level of consciousness and vital signs were monitored continuously by radiology nursing throughout the procedure under my direct supervision. Total monitored sedation time: 20 minutes FLUOROSCOPY TIME:  CT dose was not reported. COMPLICATIONS: None immediate. Estimated blood loss: <5 mL PROCEDURE: Informed written consent was obtained from the patient after a thorough discussion of the procedural risks, benefits and alternatives. All questions were addressed. Maximal Sterile Barrier Technique was utilized including caps, mask, sterile gowns, sterile gloves, sterile drape, hand hygiene and skin antiseptic. A timeout was performed prior to the initiation of the procedure. The patient was positioned prone and non-contrast localization CT was performed of the pelvis to demonstrate the iliac marrow spaces. Maximal barrier sterile technique utilized including caps, mask, sterile gowns, sterile gloves, large sterile drape, hand hygiene, and betadine prep. Under sterile conditions and local anesthesia, an 11 gauge coaxial bone biopsy needle was advanced into the RIGHT iliac marrow space. Needle position was confirmed with CT imaging. Initially, bone marrow aspiration was performed.  Next, the 11 gauge outer cannula was utilized to obtain a RIGHT iliac bone marrow core biopsy. Needle was removed. Hemostasis was obtained with compression. The patient tolerated the procedure well. Samples were prepared with the cytotechnologist. IMPRESSION: Successful CT guided bone marrow aspiration and biopsy, as above. Leslie Birks, MD [facility] Electronically Signed   By: [name] M.D.   On: 11/15/2020 11:17     11/15/2020  DIAGNOSIS:   BONE MARROW, ASPIRATE, CLOT, CORE:  -Hypercellular bone marrow for age with trilineage hematopoiesis  -See comment   PERIPHERAL BLOOD:  -Microcytic-hypochromic anemia  -Mild neutrophilic left shift   COMMENT:   The bone marrow is hypercellular for age with trilineage hematopoiesis  and nonspecific myeloid changes likely secondary in nature.  No increase  in blastic cells identified.  The peripheral blood shows morphologic  features suggestive of hemolysis.  Clinical and cytogenetic correlation  is recommended.   Cytogenetics: Normal female karyotype.  No splenomegaly on recent abdominal imaging.  All questions were answered. The patient knows to call the clinic with any problems, questions or concerns. I spent 30 minutes in the care of this patient including H and P, review of records, counseling and coordination of care.     Leslie Pike, MD 11/30/2020 11:24 AM

## 2020-11-29 NOTE — Patient Outreach (Signed)
[place] Coordinator follow up. Ms. [name] resides in [facility]. She was previously active with Care Management.  Telephone call made to Ms. [name] [phone] to discuss her transition plans. Ms. [name] reports she has received a packet from Medicaid that she needs assist with. States she is confident that she has the necessary documents. States she has her laptop with her at the facility to retrieve documents needed.   Ms. [name] expresses concerns and asks that writer assist in passing information along. Ms. [name] endorses that she has spoke to multiple staff members including nursing administration at [facility].     Ms. [name] states she need follow up with GI and hematology/oncology as stated on hospital dc summary. She also wants her pain medication to be restarted. States she has been on pain medication for a very long time and is not sure why it was discontinued. Lastly, Ms. [name] is interested in transferring to a different facility.  Ms. [name] endorses that she lives alone with limited support. Reports she primarily uses a motorized wheelchair. States she cannot afford out of pocket expense for private duty at home or ALF. Discussed how important it is to have Medicaid process completed.   Discussed that writer will follow back up with her on Friday to see how things are coming along. Also provided wrier's contact information. Will also reach out to [employer] to make relay Ms. [name]'s concerns.  Telephone call made to [facility]. Spoke with Rosendo Gros about members' concerns and medical appointments. Rosendo Gros states she will follow up. Also sent message to [place] regarding Medicaid application process and member's interest in possibly transferring to another facility.    Leslie Rolling, MSN, RN,BSN [place] Acute Care Coordinator [phone] [facility]) [phone]  (Toll free office)

## 2020-11-30 ENCOUNTER — Inpatient Hospital Stay: Payer: Medicare Other

## 2020-11-30 ENCOUNTER — Inpatient Hospital Stay: Payer: Medicare Other | Attending: Hematology and Oncology | Admitting: Hematology and Oncology

## 2020-11-30 ENCOUNTER — Other Ambulatory Visit: Payer: Self-pay

## 2020-11-30 ENCOUNTER — Encounter: Payer: Self-pay | Admitting: Hematology and Oncology

## 2020-11-30 VITALS — BP 112/60 | HR 95 | Temp 98.1°F | Resp 16 | Ht 65.0 in

## 2020-11-30 DIAGNOSIS — M810 Age-related osteoporosis without current pathological fracture: Secondary | ICD-10-CM | POA: Diagnosis not present

## 2020-11-30 DIAGNOSIS — M25562 Pain in left knee: Secondary | ICD-10-CM | POA: Insufficient documentation

## 2020-11-30 DIAGNOSIS — M069 Rheumatoid arthritis, unspecified: Secondary | ICD-10-CM | POA: Insufficient documentation

## 2020-11-30 DIAGNOSIS — Z79899 Other long term (current) drug therapy: Secondary | ICD-10-CM | POA: Diagnosis not present

## 2020-11-30 DIAGNOSIS — Z86711 Personal history of pulmonary embolism: Secondary | ICD-10-CM | POA: Insufficient documentation

## 2020-11-30 DIAGNOSIS — M50321 Other cervical disc degeneration at C4-C5 level: Secondary | ICD-10-CM | POA: Diagnosis not present

## 2020-11-30 DIAGNOSIS — Q2739 Arteriovenous malformation, other site: Secondary | ICD-10-CM | POA: Diagnosis not present

## 2020-11-30 DIAGNOSIS — D500 Iron deficiency anemia secondary to blood loss (chronic): Secondary | ICD-10-CM | POA: Insufficient documentation

## 2020-11-30 DIAGNOSIS — Z803 Family history of malignant neoplasm of breast: Secondary | ICD-10-CM | POA: Diagnosis not present

## 2020-11-30 DIAGNOSIS — J90 Pleural effusion, not elsewhere classified: Secondary | ICD-10-CM | POA: Diagnosis not present

## 2020-11-30 DIAGNOSIS — Z7951 Long term (current) use of inhaled steroids: Secondary | ICD-10-CM | POA: Insufficient documentation

## 2020-11-30 DIAGNOSIS — D638 Anemia in other chronic diseases classified elsewhere: Secondary | ICD-10-CM

## 2020-11-30 DIAGNOSIS — M797 Fibromyalgia: Secondary | ICD-10-CM | POA: Insufficient documentation

## 2020-11-30 DIAGNOSIS — J439 Emphysema, unspecified: Secondary | ICD-10-CM | POA: Diagnosis not present

## 2020-11-30 DIAGNOSIS — G4730 Sleep apnea, unspecified: Secondary | ICD-10-CM | POA: Diagnosis not present

## 2020-11-30 DIAGNOSIS — E119 Type 2 diabetes mellitus without complications: Secondary | ICD-10-CM | POA: Insufficient documentation

## 2020-11-30 DIAGNOSIS — Z794 Long term (current) use of insulin: Secondary | ICD-10-CM | POA: Insufficient documentation

## 2020-11-30 DIAGNOSIS — M25561 Pain in right knee: Secondary | ICD-10-CM | POA: Insufficient documentation

## 2020-11-30 DIAGNOSIS — M47819 Spondylosis without myelopathy or radiculopathy, site unspecified: Secondary | ICD-10-CM | POA: Diagnosis not present

## 2020-11-30 DIAGNOSIS — D649 Anemia, unspecified: Secondary | ICD-10-CM

## 2020-11-30 LAB — CBC WITH DIFFERENTIAL/PLATELET
Abs Immature Granulocytes: 0.06 10*3/uL (ref 0.00–0.07)
Basophils Absolute: 0.1 10*3/uL (ref 0.0–0.1)
Basophils Relative: 1 %
Eosinophils Absolute: 0.2 10*3/uL (ref 0.0–0.5)
Eosinophils Relative: 2 %
HCT: 35.6 % — ABNORMAL LOW (ref 36.0–46.0)
Hemoglobin: 10.3 g/dL — ABNORMAL LOW (ref 12.0–15.0)
Immature Granulocytes: 1 %
Lymphocytes Relative: 22 %
Lymphs Abs: 2.1 10*3/uL (ref 0.7–4.0)
MCH: 22.2 pg — ABNORMAL LOW (ref 26.0–34.0)
MCHC: 28.9 g/dL — ABNORMAL LOW (ref 30.0–36.0)
MCV: 76.6 fL — ABNORMAL LOW (ref 80.0–100.0)
Monocytes Absolute: 0.6 10*3/uL (ref 0.1–1.0)
Monocytes Relative: 6 %
Neutro Abs: 6.4 10*3/uL (ref 1.7–7.7)
Neutrophils Relative %: 68 %
Platelets: 458 10*3/uL — ABNORMAL HIGH (ref 150–400)
RBC: 4.65 MIL/uL (ref 3.87–5.11)
RDW: 21.6 % — ABNORMAL HIGH (ref 11.5–15.5)
WBC: 9.5 10*3/uL (ref 4.0–10.5)
nRBC: 0 % (ref 0.0–0.2)

## 2020-11-30 LAB — FERRITIN: Ferritin: 139 ng/mL (ref 11–307)

## 2020-11-30 LAB — COMPREHENSIVE METABOLIC PANEL
ALT: 6 U/L (ref 0–44)
AST: 11 U/L — ABNORMAL LOW (ref 15–41)
Albumin: 2.9 g/dL — ABNORMAL LOW (ref 3.5–5.0)
Alkaline Phosphatase: 88 U/L (ref 38–126)
Anion gap: 12 (ref 5–15)
BUN: 25 mg/dL — ABNORMAL HIGH (ref 8–23)
CO2: 24 mmol/L (ref 22–32)
Calcium: 9.5 mg/dL (ref 8.9–10.3)
Chloride: 106 mmol/L (ref 98–111)
Creatinine, Ser: 0.88 mg/dL (ref 0.44–1.00)
GFR, Estimated: >60 mL/min (ref >60)
Glucose, Bld: 114 mg/dL — ABNORMAL HIGH (ref 70–99)
Potassium: 4.4 mmol/L (ref 3.5–5.1)
Sodium: 142 mmol/L (ref 135–145)
Total Bilirubin: 0.4 mg/dL (ref 0.3–1.2)
Total Protein: 6.4 g/dL — ABNORMAL LOW (ref 6.5–8.1)

## 2020-11-30 LAB — RETICULOCYTES
Immature Retic Fract: 23.1 % — ABNORMAL HIGH (ref 2.3–15.9)
RBC.: 4.61 MIL/uL (ref 3.87–5.11)
Retic Count, Absolute: 58.5 10*3/uL (ref 19.0–186.0)
Retic Ct Pct: 1.3 % (ref 0.4–3.1)

## 2020-11-30 LAB — IRON AND TIBC
Iron: 32 ug/dL — ABNORMAL LOW (ref 41–142)
Saturation Ratios: 14 % — ABNORMAL LOW (ref 21–57)
TIBC: 233 ug/dL — ABNORMAL LOW (ref 236–444)
UIBC: 200 ug/dL (ref 120–384)

## 2020-11-30 LAB — LACTATE DEHYDROGENASE: LDH: 140 U/L (ref 98–192)

## 2020-12-01 ENCOUNTER — Other Ambulatory Visit: Payer: Self-pay | Admitting: *Deleted

## 2020-12-01 NOTE — Patient Outreach (Signed)
[facility] Post- Acute Care Coordinator follow up. Ms. Snethen resides in [facility].   Telephone call made to Ms. Mcknight [phone] to follow up on previous conversation with Probation officer. No answer. HIPAA compliant voicemail message left to request return call.   Will plan outreach again at later time.    Marthenia Rolling, MSN, RN,BSN [place] Acute Care Coordinator [phone] [facility]) [phone]  (Toll free office)

## 2020-12-04 ENCOUNTER — Other Ambulatory Visit: Payer: Self-pay | Admitting: *Deleted

## 2020-12-04 NOTE — Patient Outreach (Signed)
[place] Coordinator follow up. Ms. Casamento remains in [facility].   Telephone call made to Ms. Crom [phone] to follow up. Ms. Downum states she is still waiting for assistance with sending documents to social services for her Medicaid application. States her documents are on her computer, which she has with her.   Writer to follow up again with facility, during business hours, to request assistance.    Marthenia Rolling, MSN, RN,BSN [place] Acute Care Coordinator [phone] [facility]) [phone]  (Toll free office)

## 2020-12-05 ENCOUNTER — Other Ambulatory Visit: Payer: Self-pay | Admitting: *Deleted

## 2020-12-05 NOTE — Patient Outreach (Addendum)
[facility] Post- Acute Care Coordinator follow up. Ms. Brose is in [facility].   Spoke with [facility] SNF SW to discuss Ms. Vester's request for assistance with Medicaid application. SNF SW reports she has Ms. Reece on her list to see first thing this morning.   Writer expressed appreciation of SNF SW assistance. Also discussed how member does not have assistance at home. Can benefit from long term care.   Will continue to follow.  Addendum: Message received from SNF SW stating member is currently out of the facility for GI appointment. States she will attempt to follow up with member later today.  Marthenia Rolling, MSN, RN,BSN [place] Acute Care Coordinator [phone] [facility]) [phone]  (Toll free office)

## 2020-12-07 DIAGNOSIS — R197 Diarrhea, unspecified: Secondary | ICD-10-CM | POA: Diagnosis not present

## 2020-12-11 ENCOUNTER — Ambulatory Visit: Payer: Self-pay | Admitting: *Deleted

## 2020-12-11 ENCOUNTER — Ambulatory Visit: Payer: Medicare Other | Admitting: Podiatry

## 2020-12-11 LAB — SURGICAL PATHOLOGY

## 2020-12-12 ENCOUNTER — Other Ambulatory Visit: Payer: Self-pay | Admitting: *Deleted

## 2020-12-12 NOTE — Patient Outreach (Signed)
[place] Coordinator follow up. Ms. Beattie resides in [facility].   Update received from [place] indicating she is assisting Ms. Wilbanks in applying for [employer]. Uncertain if Ms. Thompson wishes to stay long term at facility.  Will continue to follow while member resides in SNF.   Marthenia Rolling, MSN, RN,BSN [place] Acute Care Coordinator [phone] [facility]) [phone]  (Toll free office)

## 2020-12-21 ENCOUNTER — Other Ambulatory Visit: Payer: Self-pay | Admitting: *Deleted

## 2020-12-21 NOTE — Patient Outreach (Signed)
[place] Coordinator follow up. Ms. Appleman resides in [facility].  Telephone call made to Ms. Tarkington to discuss transition plans. Ms. Hacker states she was informed that she is supposed to dc on Friday, Oct. 14th. States she appealed and is waiting for decision. In the meantime, Ms. Biehler is looking into [facility] ALF. Reports ALF will be cheaper than staying LTC.   Ms. Jolley states she understands going home is not a safe option for her. She also reports she will have to spend down to be eligible for Medicaid. She will have to pay out of pocket for ALF or LTC.   Will follow up with Ms. Hedstrom to find out her decision. Communication also sent to facility [place], MSN, RN,BSN [place] Acute Care Coordinator [phone] [facility]) [phone]  (Toll free office)

## 2020-12-25 ENCOUNTER — Other Ambulatory Visit: Payer: Self-pay | Admitting: *Deleted

## 2020-12-25 DIAGNOSIS — K623 Rectal prolapse: Secondary | ICD-10-CM | POA: Diagnosis not present

## 2020-12-25 DIAGNOSIS — R5381 Other malaise: Secondary | ICD-10-CM | POA: Diagnosis not present

## 2020-12-25 DIAGNOSIS — N183 Chronic kidney disease, stage 3 unspecified: Secondary | ICD-10-CM | POA: Diagnosis not present

## 2020-12-25 DIAGNOSIS — E1122 Type 2 diabetes mellitus with diabetic chronic kidney disease: Secondary | ICD-10-CM | POA: Diagnosis not present

## 2020-12-25 NOTE — Patient Outreach (Signed)
[place] Coordinator follow up.   Verified in [place] that Ms. Spieth has transitioned to private pay at [facility] [employer].   Telephone call made to Ms. Bucklin [phone] to confirm her long term plans. Ms. Porchia stated it was not a good time to talk. Asked Probation officer to call back at later time.   Will plan outreach again.   Marthenia Rolling, MSN, RN,BSN [place] Acute Care Coordinator [phone] [facility]) [phone]  (Toll free office)

## 2020-12-27 DIAGNOSIS — E119 Type 2 diabetes mellitus without complications: Secondary | ICD-10-CM | POA: Diagnosis not present

## 2020-12-27 DIAGNOSIS — E7800 Pure hypercholesterolemia, unspecified: Secondary | ICD-10-CM | POA: Diagnosis not present

## 2020-12-27 DIAGNOSIS — K219 Gastro-esophageal reflux disease without esophagitis: Secondary | ICD-10-CM | POA: Diagnosis not present

## 2020-12-27 DIAGNOSIS — M1990 Unspecified osteoarthritis, unspecified site: Secondary | ICD-10-CM | POA: Diagnosis not present

## 2020-12-27 DIAGNOSIS — J449 Chronic obstructive pulmonary disease, unspecified: Secondary | ICD-10-CM | POA: Diagnosis not present

## 2020-12-27 DIAGNOSIS — R531 Weakness: Secondary | ICD-10-CM | POA: Diagnosis not present

## 2020-12-29 DIAGNOSIS — M6281 Muscle weakness (generalized): Secondary | ICD-10-CM | POA: Diagnosis not present

## 2020-12-29 DIAGNOSIS — R41841 Cognitive communication deficit: Secondary | ICD-10-CM | POA: Diagnosis not present

## 2020-12-29 DIAGNOSIS — R262 Difficulty in walking, not elsewhere classified: Secondary | ICD-10-CM | POA: Diagnosis not present

## 2020-12-29 DIAGNOSIS — R2689 Other abnormalities of gait and mobility: Secondary | ICD-10-CM | POA: Diagnosis not present

## 2020-12-29 DIAGNOSIS — M0609 Rheumatoid arthritis without rheumatoid factor, multiple sites: Secondary | ICD-10-CM | POA: Diagnosis not present

## 2020-12-29 DIAGNOSIS — R2681 Unsteadiness on feet: Secondary | ICD-10-CM | POA: Diagnosis not present

## 2020-12-31 DIAGNOSIS — R2689 Other abnormalities of gait and mobility: Secondary | ICD-10-CM | POA: Diagnosis not present

## 2020-12-31 DIAGNOSIS — R2681 Unsteadiness on feet: Secondary | ICD-10-CM | POA: Diagnosis not present

## 2020-12-31 DIAGNOSIS — M0609 Rheumatoid arthritis without rheumatoid factor, multiple sites: Secondary | ICD-10-CM | POA: Diagnosis not present

## 2020-12-31 DIAGNOSIS — R41841 Cognitive communication deficit: Secondary | ICD-10-CM | POA: Diagnosis not present

## 2020-12-31 DIAGNOSIS — R262 Difficulty in walking, not elsewhere classified: Secondary | ICD-10-CM | POA: Diagnosis not present

## 2020-12-31 DIAGNOSIS — M6281 Muscle weakness (generalized): Secondary | ICD-10-CM | POA: Diagnosis not present

## 2021-01-01 ENCOUNTER — Ambulatory Visit: Payer: Medicare Other | Admitting: Podiatry

## 2021-01-01 DIAGNOSIS — M6281 Muscle weakness (generalized): Secondary | ICD-10-CM | POA: Diagnosis not present

## 2021-01-01 DIAGNOSIS — M0609 Rheumatoid arthritis without rheumatoid factor, multiple sites: Secondary | ICD-10-CM | POA: Diagnosis not present

## 2021-01-01 DIAGNOSIS — E785 Hyperlipidemia, unspecified: Secondary | ICD-10-CM | POA: Diagnosis not present

## 2021-01-01 DIAGNOSIS — R2689 Other abnormalities of gait and mobility: Secondary | ICD-10-CM | POA: Diagnosis not present

## 2021-01-01 DIAGNOSIS — J449 Chronic obstructive pulmonary disease, unspecified: Secondary | ICD-10-CM | POA: Diagnosis not present

## 2021-01-01 DIAGNOSIS — R0989 Other specified symptoms and signs involving the circulatory and respiratory systems: Secondary | ICD-10-CM | POA: Diagnosis not present

## 2021-01-01 DIAGNOSIS — R2681 Unsteadiness on feet: Secondary | ICD-10-CM | POA: Diagnosis not present

## 2021-01-01 DIAGNOSIS — E1169 Type 2 diabetes mellitus with other specified complication: Secondary | ICD-10-CM | POA: Diagnosis not present

## 2021-01-01 DIAGNOSIS — R079 Chest pain, unspecified: Secondary | ICD-10-CM | POA: Diagnosis not present

## 2021-01-01 DIAGNOSIS — R262 Difficulty in walking, not elsewhere classified: Secondary | ICD-10-CM | POA: Diagnosis not present

## 2021-01-01 DIAGNOSIS — R5381 Other malaise: Secondary | ICD-10-CM | POA: Diagnosis not present

## 2021-01-01 DIAGNOSIS — R41841 Cognitive communication deficit: Secondary | ICD-10-CM | POA: Diagnosis not present

## 2021-01-02 DIAGNOSIS — R262 Difficulty in walking, not elsewhere classified: Secondary | ICD-10-CM | POA: Diagnosis not present

## 2021-01-02 DIAGNOSIS — M0609 Rheumatoid arthritis without rheumatoid factor, multiple sites: Secondary | ICD-10-CM | POA: Diagnosis not present

## 2021-01-02 DIAGNOSIS — R41841 Cognitive communication deficit: Secondary | ICD-10-CM | POA: Diagnosis not present

## 2021-01-02 DIAGNOSIS — R2681 Unsteadiness on feet: Secondary | ICD-10-CM | POA: Diagnosis not present

## 2021-01-02 DIAGNOSIS — M6281 Muscle weakness (generalized): Secondary | ICD-10-CM | POA: Diagnosis not present

## 2021-01-02 DIAGNOSIS — I10 Essential (primary) hypertension: Secondary | ICD-10-CM | POA: Diagnosis not present

## 2021-01-02 DIAGNOSIS — R2689 Other abnormalities of gait and mobility: Secondary | ICD-10-CM | POA: Diagnosis not present

## 2021-01-02 DIAGNOSIS — I4891 Unspecified atrial fibrillation: Secondary | ICD-10-CM | POA: Diagnosis not present

## 2021-01-03 DIAGNOSIS — R262 Difficulty in walking, not elsewhere classified: Secondary | ICD-10-CM | POA: Diagnosis not present

## 2021-01-03 DIAGNOSIS — M0609 Rheumatoid arthritis without rheumatoid factor, multiple sites: Secondary | ICD-10-CM | POA: Diagnosis not present

## 2021-01-03 DIAGNOSIS — M6281 Muscle weakness (generalized): Secondary | ICD-10-CM | POA: Diagnosis not present

## 2021-01-03 DIAGNOSIS — R2681 Unsteadiness on feet: Secondary | ICD-10-CM | POA: Diagnosis not present

## 2021-01-03 DIAGNOSIS — R41841 Cognitive communication deficit: Secondary | ICD-10-CM | POA: Diagnosis not present

## 2021-01-03 DIAGNOSIS — R2689 Other abnormalities of gait and mobility: Secondary | ICD-10-CM | POA: Diagnosis not present

## 2021-01-04 DIAGNOSIS — R41841 Cognitive communication deficit: Secondary | ICD-10-CM | POA: Diagnosis not present

## 2021-01-04 DIAGNOSIS — M0609 Rheumatoid arthritis without rheumatoid factor, multiple sites: Secondary | ICD-10-CM | POA: Diagnosis not present

## 2021-01-04 DIAGNOSIS — R2681 Unsteadiness on feet: Secondary | ICD-10-CM | POA: Diagnosis not present

## 2021-01-04 DIAGNOSIS — R2689 Other abnormalities of gait and mobility: Secondary | ICD-10-CM | POA: Diagnosis not present

## 2021-01-04 DIAGNOSIS — R262 Difficulty in walking, not elsewhere classified: Secondary | ICD-10-CM | POA: Diagnosis not present

## 2021-01-04 DIAGNOSIS — M6281 Muscle weakness (generalized): Secondary | ICD-10-CM | POA: Diagnosis not present

## 2021-01-05 DIAGNOSIS — R2689 Other abnormalities of gait and mobility: Secondary | ICD-10-CM | POA: Diagnosis not present

## 2021-01-05 DIAGNOSIS — R41841 Cognitive communication deficit: Secondary | ICD-10-CM | POA: Diagnosis not present

## 2021-01-05 DIAGNOSIS — R262 Difficulty in walking, not elsewhere classified: Secondary | ICD-10-CM | POA: Diagnosis not present

## 2021-01-05 DIAGNOSIS — R2681 Unsteadiness on feet: Secondary | ICD-10-CM | POA: Diagnosis not present

## 2021-01-05 DIAGNOSIS — M6281 Muscle weakness (generalized): Secondary | ICD-10-CM | POA: Diagnosis not present

## 2021-01-05 DIAGNOSIS — M0609 Rheumatoid arthritis without rheumatoid factor, multiple sites: Secondary | ICD-10-CM | POA: Diagnosis not present

## 2021-01-08 DIAGNOSIS — R2681 Unsteadiness on feet: Secondary | ICD-10-CM | POA: Diagnosis not present

## 2021-01-08 DIAGNOSIS — M6281 Muscle weakness (generalized): Secondary | ICD-10-CM | POA: Diagnosis not present

## 2021-01-08 DIAGNOSIS — R262 Difficulty in walking, not elsewhere classified: Secondary | ICD-10-CM | POA: Diagnosis not present

## 2021-01-08 DIAGNOSIS — M0609 Rheumatoid arthritis without rheumatoid factor, multiple sites: Secondary | ICD-10-CM | POA: Diagnosis not present

## 2021-01-08 DIAGNOSIS — R41841 Cognitive communication deficit: Secondary | ICD-10-CM | POA: Diagnosis not present

## 2021-01-08 DIAGNOSIS — R2689 Other abnormalities of gait and mobility: Secondary | ICD-10-CM | POA: Diagnosis not present

## 2021-01-09 DIAGNOSIS — M069 Rheumatoid arthritis, unspecified: Secondary | ICD-10-CM | POA: Diagnosis not present

## 2021-01-09 DIAGNOSIS — R2689 Other abnormalities of gait and mobility: Secondary | ICD-10-CM | POA: Diagnosis not present

## 2021-01-09 DIAGNOSIS — R1310 Dysphagia, unspecified: Secondary | ICD-10-CM | POA: Diagnosis not present

## 2021-01-09 DIAGNOSIS — R262 Difficulty in walking, not elsewhere classified: Secondary | ICD-10-CM | POA: Diagnosis not present

## 2021-01-09 DIAGNOSIS — M6281 Muscle weakness (generalized): Secondary | ICD-10-CM | POA: Diagnosis not present

## 2021-01-10 ENCOUNTER — Other Ambulatory Visit: Payer: Self-pay | Admitting: *Deleted

## 2021-01-10 DIAGNOSIS — R2689 Other abnormalities of gait and mobility: Secondary | ICD-10-CM | POA: Diagnosis not present

## 2021-01-10 DIAGNOSIS — M6281 Muscle weakness (generalized): Secondary | ICD-10-CM | POA: Diagnosis not present

## 2021-01-10 DIAGNOSIS — M069 Rheumatoid arthritis, unspecified: Secondary | ICD-10-CM | POA: Diagnosis not present

## 2021-01-10 DIAGNOSIS — R262 Difficulty in walking, not elsewhere classified: Secondary | ICD-10-CM | POA: Diagnosis not present

## 2021-01-10 DIAGNOSIS — R1310 Dysphagia, unspecified: Secondary | ICD-10-CM | POA: Diagnosis not present

## 2021-01-10 NOTE — Patient Outreach (Signed)
[place] Coordinator follow up.   Telephone call made to Ms. Bugge [phone]. Patient identifiers confirmed. Ms. Denunzio endorses she is now staying in long term care at [facility]. She reports that she is doing well at the facility and that her Medicaid was approved.  Will sign off. No further [facility] Care Management needs identified.    Marthenia Rolling, MSN, RN,BSN [place] Acute Care Coordinator [phone] [facility]) [phone]  (Toll free office)

## 2021-01-11 DIAGNOSIS — R2689 Other abnormalities of gait and mobility: Secondary | ICD-10-CM | POA: Diagnosis not present

## 2021-01-11 DIAGNOSIS — M069 Rheumatoid arthritis, unspecified: Secondary | ICD-10-CM | POA: Diagnosis not present

## 2021-01-11 DIAGNOSIS — E1122 Type 2 diabetes mellitus with diabetic chronic kidney disease: Secondary | ICD-10-CM | POA: Diagnosis not present

## 2021-01-11 DIAGNOSIS — M6281 Muscle weakness (generalized): Secondary | ICD-10-CM | POA: Diagnosis not present

## 2021-01-11 DIAGNOSIS — R1310 Dysphagia, unspecified: Secondary | ICD-10-CM | POA: Diagnosis not present

## 2021-01-11 DIAGNOSIS — J449 Chronic obstructive pulmonary disease, unspecified: Secondary | ICD-10-CM | POA: Diagnosis not present

## 2021-01-11 DIAGNOSIS — R262 Difficulty in walking, not elsewhere classified: Secondary | ICD-10-CM | POA: Diagnosis not present

## 2021-01-11 DIAGNOSIS — R5381 Other malaise: Secondary | ICD-10-CM | POA: Diagnosis not present

## 2021-01-12 DIAGNOSIS — R2689 Other abnormalities of gait and mobility: Secondary | ICD-10-CM | POA: Diagnosis not present

## 2021-01-12 DIAGNOSIS — M069 Rheumatoid arthritis, unspecified: Secondary | ICD-10-CM | POA: Diagnosis not present

## 2021-01-12 DIAGNOSIS — R1310 Dysphagia, unspecified: Secondary | ICD-10-CM | POA: Diagnosis not present

## 2021-01-12 DIAGNOSIS — R262 Difficulty in walking, not elsewhere classified: Secondary | ICD-10-CM | POA: Diagnosis not present

## 2021-01-12 DIAGNOSIS — M6281 Muscle weakness (generalized): Secondary | ICD-10-CM | POA: Diagnosis not present

## 2021-01-14 DIAGNOSIS — R1310 Dysphagia, unspecified: Secondary | ICD-10-CM | POA: Diagnosis not present

## 2021-01-14 DIAGNOSIS — R262 Difficulty in walking, not elsewhere classified: Secondary | ICD-10-CM | POA: Diagnosis not present

## 2021-01-14 DIAGNOSIS — R2689 Other abnormalities of gait and mobility: Secondary | ICD-10-CM | POA: Diagnosis not present

## 2021-01-14 DIAGNOSIS — M6281 Muscle weakness (generalized): Secondary | ICD-10-CM | POA: Diagnosis not present

## 2021-01-14 DIAGNOSIS — M069 Rheumatoid arthritis, unspecified: Secondary | ICD-10-CM | POA: Diagnosis not present

## 2021-01-15 DIAGNOSIS — R262 Difficulty in walking, not elsewhere classified: Secondary | ICD-10-CM | POA: Diagnosis not present

## 2021-01-15 DIAGNOSIS — M6281 Muscle weakness (generalized): Secondary | ICD-10-CM | POA: Diagnosis not present

## 2021-01-15 DIAGNOSIS — M069 Rheumatoid arthritis, unspecified: Secondary | ICD-10-CM | POA: Diagnosis not present

## 2021-01-15 DIAGNOSIS — R1310 Dysphagia, unspecified: Secondary | ICD-10-CM | POA: Diagnosis not present

## 2021-01-15 DIAGNOSIS — R2689 Other abnormalities of gait and mobility: Secondary | ICD-10-CM | POA: Diagnosis not present

## 2021-01-16 DIAGNOSIS — R262 Difficulty in walking, not elsewhere classified: Secondary | ICD-10-CM | POA: Diagnosis not present

## 2021-01-16 DIAGNOSIS — M6281 Muscle weakness (generalized): Secondary | ICD-10-CM | POA: Diagnosis not present

## 2021-01-16 DIAGNOSIS — R1310 Dysphagia, unspecified: Secondary | ICD-10-CM | POA: Diagnosis not present

## 2021-01-16 DIAGNOSIS — R2689 Other abnormalities of gait and mobility: Secondary | ICD-10-CM | POA: Diagnosis not present

## 2021-01-16 DIAGNOSIS — M069 Rheumatoid arthritis, unspecified: Secondary | ICD-10-CM | POA: Diagnosis not present

## 2021-01-17 DIAGNOSIS — R262 Difficulty in walking, not elsewhere classified: Secondary | ICD-10-CM | POA: Diagnosis not present

## 2021-01-17 DIAGNOSIS — R2689 Other abnormalities of gait and mobility: Secondary | ICD-10-CM | POA: Diagnosis not present

## 2021-01-17 DIAGNOSIS — Z1331 Encounter for screening for depression: Secondary | ICD-10-CM | POA: Diagnosis not present

## 2021-01-17 DIAGNOSIS — Z136 Encounter for screening for cardiovascular disorders: Secondary | ICD-10-CM | POA: Diagnosis not present

## 2021-01-17 DIAGNOSIS — Z1339 Encounter for screening examination for other mental health and behavioral disorders: Secondary | ICD-10-CM | POA: Diagnosis not present

## 2021-01-17 DIAGNOSIS — M6281 Muscle weakness (generalized): Secondary | ICD-10-CM | POA: Diagnosis not present

## 2021-01-17 DIAGNOSIS — M069 Rheumatoid arthritis, unspecified: Secondary | ICD-10-CM | POA: Diagnosis not present

## 2021-01-17 DIAGNOSIS — R1310 Dysphagia, unspecified: Secondary | ICD-10-CM | POA: Diagnosis not present

## 2021-01-17 DIAGNOSIS — Z139 Encounter for screening, unspecified: Secondary | ICD-10-CM | POA: Diagnosis not present

## 2021-01-17 DIAGNOSIS — Z0000 Encounter for general adult medical examination without abnormal findings: Secondary | ICD-10-CM | POA: Diagnosis not present

## 2021-01-18 DIAGNOSIS — M069 Rheumatoid arthritis, unspecified: Secondary | ICD-10-CM | POA: Diagnosis not present

## 2021-01-18 DIAGNOSIS — R1310 Dysphagia, unspecified: Secondary | ICD-10-CM | POA: Diagnosis not present

## 2021-01-18 DIAGNOSIS — R262 Difficulty in walking, not elsewhere classified: Secondary | ICD-10-CM | POA: Diagnosis not present

## 2021-01-18 DIAGNOSIS — R2689 Other abnormalities of gait and mobility: Secondary | ICD-10-CM | POA: Diagnosis not present

## 2021-01-18 DIAGNOSIS — M6281 Muscle weakness (generalized): Secondary | ICD-10-CM | POA: Diagnosis not present

## 2021-01-19 DIAGNOSIS — R2689 Other abnormalities of gait and mobility: Secondary | ICD-10-CM | POA: Diagnosis not present

## 2021-01-19 DIAGNOSIS — R1310 Dysphagia, unspecified: Secondary | ICD-10-CM | POA: Diagnosis not present

## 2021-01-19 DIAGNOSIS — M069 Rheumatoid arthritis, unspecified: Secondary | ICD-10-CM | POA: Diagnosis not present

## 2021-01-19 DIAGNOSIS — M6281 Muscle weakness (generalized): Secondary | ICD-10-CM | POA: Diagnosis not present

## 2021-01-19 DIAGNOSIS — R262 Difficulty in walking, not elsewhere classified: Secondary | ICD-10-CM | POA: Diagnosis not present

## 2021-01-22 DIAGNOSIS — M069 Rheumatoid arthritis, unspecified: Secondary | ICD-10-CM | POA: Diagnosis not present

## 2021-01-22 DIAGNOSIS — R2689 Other abnormalities of gait and mobility: Secondary | ICD-10-CM | POA: Diagnosis not present

## 2021-01-22 DIAGNOSIS — R262 Difficulty in walking, not elsewhere classified: Secondary | ICD-10-CM | POA: Diagnosis not present

## 2021-01-22 DIAGNOSIS — M6281 Muscle weakness (generalized): Secondary | ICD-10-CM | POA: Diagnosis not present

## 2021-01-22 DIAGNOSIS — R1310 Dysphagia, unspecified: Secondary | ICD-10-CM | POA: Diagnosis not present

## 2021-01-23 DIAGNOSIS — M069 Rheumatoid arthritis, unspecified: Secondary | ICD-10-CM | POA: Diagnosis not present

## 2021-01-23 DIAGNOSIS — R262 Difficulty in walking, not elsewhere classified: Secondary | ICD-10-CM | POA: Diagnosis not present

## 2021-01-23 DIAGNOSIS — R1310 Dysphagia, unspecified: Secondary | ICD-10-CM | POA: Diagnosis not present

## 2021-01-23 DIAGNOSIS — I10 Essential (primary) hypertension: Secondary | ICD-10-CM | POA: Diagnosis not present

## 2021-01-23 DIAGNOSIS — E119 Type 2 diabetes mellitus without complications: Secondary | ICD-10-CM | POA: Diagnosis not present

## 2021-01-23 DIAGNOSIS — R2689 Other abnormalities of gait and mobility: Secondary | ICD-10-CM | POA: Diagnosis not present

## 2021-01-23 DIAGNOSIS — M6281 Muscle weakness (generalized): Secondary | ICD-10-CM | POA: Diagnosis not present

## 2021-01-24 DIAGNOSIS — R262 Difficulty in walking, not elsewhere classified: Secondary | ICD-10-CM | POA: Diagnosis not present

## 2021-01-24 DIAGNOSIS — R1310 Dysphagia, unspecified: Secondary | ICD-10-CM | POA: Diagnosis not present

## 2021-01-24 DIAGNOSIS — M069 Rheumatoid arthritis, unspecified: Secondary | ICD-10-CM | POA: Diagnosis not present

## 2021-01-24 DIAGNOSIS — R2689 Other abnormalities of gait and mobility: Secondary | ICD-10-CM | POA: Diagnosis not present

## 2021-01-24 DIAGNOSIS — M6281 Muscle weakness (generalized): Secondary | ICD-10-CM | POA: Diagnosis not present

## 2021-01-25 ENCOUNTER — Other Ambulatory Visit: Payer: Self-pay | Admitting: Physician Assistant

## 2021-01-25 DIAGNOSIS — R2689 Other abnormalities of gait and mobility: Secondary | ICD-10-CM | POA: Diagnosis not present

## 2021-01-25 DIAGNOSIS — R262 Difficulty in walking, not elsewhere classified: Secondary | ICD-10-CM | POA: Diagnosis not present

## 2021-01-25 DIAGNOSIS — M069 Rheumatoid arthritis, unspecified: Secondary | ICD-10-CM | POA: Diagnosis not present

## 2021-01-25 DIAGNOSIS — M6281 Muscle weakness (generalized): Secondary | ICD-10-CM | POA: Diagnosis not present

## 2021-01-25 DIAGNOSIS — R1310 Dysphagia, unspecified: Secondary | ICD-10-CM | POA: Diagnosis not present

## 2021-01-25 DIAGNOSIS — D638 Anemia in other chronic diseases classified elsewhere: Secondary | ICD-10-CM

## 2021-01-26 ENCOUNTER — Ambulatory Visit: Payer: Medicare Other | Admitting: Physician Assistant

## 2021-01-26 ENCOUNTER — Other Ambulatory Visit: Payer: Medicare Other

## 2021-01-26 DIAGNOSIS — M6281 Muscle weakness (generalized): Secondary | ICD-10-CM | POA: Diagnosis not present

## 2021-01-26 DIAGNOSIS — M069 Rheumatoid arthritis, unspecified: Secondary | ICD-10-CM | POA: Diagnosis not present

## 2021-01-26 DIAGNOSIS — R2689 Other abnormalities of gait and mobility: Secondary | ICD-10-CM | POA: Diagnosis not present

## 2021-01-26 DIAGNOSIS — R1310 Dysphagia, unspecified: Secondary | ICD-10-CM | POA: Diagnosis not present

## 2021-01-26 DIAGNOSIS — R262 Difficulty in walking, not elsewhere classified: Secondary | ICD-10-CM | POA: Diagnosis not present

## 2021-01-28 DIAGNOSIS — R1310 Dysphagia, unspecified: Secondary | ICD-10-CM | POA: Diagnosis not present

## 2021-01-28 DIAGNOSIS — R2689 Other abnormalities of gait and mobility: Secondary | ICD-10-CM | POA: Diagnosis not present

## 2021-01-28 DIAGNOSIS — M069 Rheumatoid arthritis, unspecified: Secondary | ICD-10-CM | POA: Diagnosis not present

## 2021-01-28 DIAGNOSIS — R262 Difficulty in walking, not elsewhere classified: Secondary | ICD-10-CM | POA: Diagnosis not present

## 2021-01-28 DIAGNOSIS — M6281 Muscle weakness (generalized): Secondary | ICD-10-CM | POA: Diagnosis not present

## 2021-01-29 DIAGNOSIS — R1310 Dysphagia, unspecified: Secondary | ICD-10-CM | POA: Diagnosis not present

## 2021-01-29 DIAGNOSIS — R262 Difficulty in walking, not elsewhere classified: Secondary | ICD-10-CM | POA: Diagnosis not present

## 2021-01-29 DIAGNOSIS — M069 Rheumatoid arthritis, unspecified: Secondary | ICD-10-CM | POA: Diagnosis not present

## 2021-01-29 DIAGNOSIS — R2689 Other abnormalities of gait and mobility: Secondary | ICD-10-CM | POA: Diagnosis not present

## 2021-01-29 DIAGNOSIS — M6281 Muscle weakness (generalized): Secondary | ICD-10-CM | POA: Diagnosis not present

## 2021-01-30 DIAGNOSIS — M069 Rheumatoid arthritis, unspecified: Secondary | ICD-10-CM | POA: Diagnosis not present

## 2021-01-30 DIAGNOSIS — R2689 Other abnormalities of gait and mobility: Secondary | ICD-10-CM | POA: Diagnosis not present

## 2021-01-30 DIAGNOSIS — R1310 Dysphagia, unspecified: Secondary | ICD-10-CM | POA: Diagnosis not present

## 2021-01-30 DIAGNOSIS — R262 Difficulty in walking, not elsewhere classified: Secondary | ICD-10-CM | POA: Diagnosis not present

## 2021-01-30 DIAGNOSIS — M6281 Muscle weakness (generalized): Secondary | ICD-10-CM | POA: Diagnosis not present

## 2021-01-31 DIAGNOSIS — R2689 Other abnormalities of gait and mobility: Secondary | ICD-10-CM | POA: Diagnosis not present

## 2021-01-31 DIAGNOSIS — R1310 Dysphagia, unspecified: Secondary | ICD-10-CM | POA: Diagnosis not present

## 2021-01-31 DIAGNOSIS — R262 Difficulty in walking, not elsewhere classified: Secondary | ICD-10-CM | POA: Diagnosis not present

## 2021-01-31 DIAGNOSIS — M6281 Muscle weakness (generalized): Secondary | ICD-10-CM | POA: Diagnosis not present

## 2021-01-31 DIAGNOSIS — M069 Rheumatoid arthritis, unspecified: Secondary | ICD-10-CM | POA: Diagnosis not present

## 2021-02-02 DIAGNOSIS — M069 Rheumatoid arthritis, unspecified: Secondary | ICD-10-CM | POA: Diagnosis not present

## 2021-02-02 DIAGNOSIS — R262 Difficulty in walking, not elsewhere classified: Secondary | ICD-10-CM | POA: Diagnosis not present

## 2021-02-02 DIAGNOSIS — R1310 Dysphagia, unspecified: Secondary | ICD-10-CM | POA: Diagnosis not present

## 2021-02-02 DIAGNOSIS — R2689 Other abnormalities of gait and mobility: Secondary | ICD-10-CM | POA: Diagnosis not present

## 2021-02-02 DIAGNOSIS — M6281 Muscle weakness (generalized): Secondary | ICD-10-CM | POA: Diagnosis not present

## 2021-02-04 DIAGNOSIS — M6281 Muscle weakness (generalized): Secondary | ICD-10-CM | POA: Diagnosis not present

## 2021-02-04 DIAGNOSIS — R1310 Dysphagia, unspecified: Secondary | ICD-10-CM | POA: Diagnosis not present

## 2021-02-04 DIAGNOSIS — M069 Rheumatoid arthritis, unspecified: Secondary | ICD-10-CM | POA: Diagnosis not present

## 2021-02-04 DIAGNOSIS — R262 Difficulty in walking, not elsewhere classified: Secondary | ICD-10-CM | POA: Diagnosis not present

## 2021-02-04 DIAGNOSIS — R2689 Other abnormalities of gait and mobility: Secondary | ICD-10-CM | POA: Diagnosis not present

## 2021-02-05 DIAGNOSIS — R2689 Other abnormalities of gait and mobility: Secondary | ICD-10-CM | POA: Diagnosis not present

## 2021-02-05 DIAGNOSIS — R262 Difficulty in walking, not elsewhere classified: Secondary | ICD-10-CM | POA: Diagnosis not present

## 2021-02-05 DIAGNOSIS — R1310 Dysphagia, unspecified: Secondary | ICD-10-CM | POA: Diagnosis not present

## 2021-02-05 DIAGNOSIS — M069 Rheumatoid arthritis, unspecified: Secondary | ICD-10-CM | POA: Diagnosis not present

## 2021-02-05 DIAGNOSIS — M6281 Muscle weakness (generalized): Secondary | ICD-10-CM | POA: Diagnosis not present

## 2021-02-06 DIAGNOSIS — M069 Rheumatoid arthritis, unspecified: Secondary | ICD-10-CM | POA: Diagnosis not present

## 2021-02-06 DIAGNOSIS — R1310 Dysphagia, unspecified: Secondary | ICD-10-CM | POA: Diagnosis not present

## 2021-02-06 DIAGNOSIS — M6281 Muscle weakness (generalized): Secondary | ICD-10-CM | POA: Diagnosis not present

## 2021-02-06 DIAGNOSIS — R262 Difficulty in walking, not elsewhere classified: Secondary | ICD-10-CM | POA: Diagnosis not present

## 2021-02-06 DIAGNOSIS — R2689 Other abnormalities of gait and mobility: Secondary | ICD-10-CM | POA: Diagnosis not present

## 2021-02-07 DIAGNOSIS — R1310 Dysphagia, unspecified: Secondary | ICD-10-CM | POA: Diagnosis not present

## 2021-02-07 DIAGNOSIS — R262 Difficulty in walking, not elsewhere classified: Secondary | ICD-10-CM | POA: Diagnosis not present

## 2021-02-07 DIAGNOSIS — M6281 Muscle weakness (generalized): Secondary | ICD-10-CM | POA: Diagnosis not present

## 2021-02-07 DIAGNOSIS — M069 Rheumatoid arthritis, unspecified: Secondary | ICD-10-CM | POA: Diagnosis not present

## 2021-02-07 DIAGNOSIS — R2689 Other abnormalities of gait and mobility: Secondary | ICD-10-CM | POA: Diagnosis not present

## 2021-02-08 DIAGNOSIS — M069 Rheumatoid arthritis, unspecified: Secondary | ICD-10-CM | POA: Diagnosis not present

## 2021-02-08 DIAGNOSIS — R262 Difficulty in walking, not elsewhere classified: Secondary | ICD-10-CM | POA: Diagnosis not present

## 2021-02-08 DIAGNOSIS — R2681 Unsteadiness on feet: Secondary | ICD-10-CM | POA: Diagnosis not present

## 2021-02-08 DIAGNOSIS — M6281 Muscle weakness (generalized): Secondary | ICD-10-CM | POA: Diagnosis not present

## 2021-02-08 DIAGNOSIS — R2689 Other abnormalities of gait and mobility: Secondary | ICD-10-CM | POA: Diagnosis not present

## 2021-02-09 DIAGNOSIS — M6281 Muscle weakness (generalized): Secondary | ICD-10-CM | POA: Diagnosis not present

## 2021-02-09 DIAGNOSIS — R2689 Other abnormalities of gait and mobility: Secondary | ICD-10-CM | POA: Diagnosis not present

## 2021-02-09 DIAGNOSIS — R2681 Unsteadiness on feet: Secondary | ICD-10-CM | POA: Diagnosis not present

## 2021-02-09 DIAGNOSIS — M069 Rheumatoid arthritis, unspecified: Secondary | ICD-10-CM | POA: Diagnosis not present

## 2021-02-09 DIAGNOSIS — R262 Difficulty in walking, not elsewhere classified: Secondary | ICD-10-CM | POA: Diagnosis not present

## 2021-02-10 DIAGNOSIS — R262 Difficulty in walking, not elsewhere classified: Secondary | ICD-10-CM | POA: Diagnosis not present

## 2021-02-10 DIAGNOSIS — M6281 Muscle weakness (generalized): Secondary | ICD-10-CM | POA: Diagnosis not present

## 2021-02-10 DIAGNOSIS — R2689 Other abnormalities of gait and mobility: Secondary | ICD-10-CM | POA: Diagnosis not present

## 2021-02-10 DIAGNOSIS — R2681 Unsteadiness on feet: Secondary | ICD-10-CM | POA: Diagnosis not present

## 2021-02-10 DIAGNOSIS — M069 Rheumatoid arthritis, unspecified: Secondary | ICD-10-CM | POA: Diagnosis not present

## 2021-02-12 DIAGNOSIS — Z961 Presence of intraocular lens: Secondary | ICD-10-CM | POA: Diagnosis not present

## 2021-02-12 DIAGNOSIS — R2681 Unsteadiness on feet: Secondary | ICD-10-CM | POA: Diagnosis not present

## 2021-02-12 DIAGNOSIS — M069 Rheumatoid arthritis, unspecified: Secondary | ICD-10-CM | POA: Diagnosis not present

## 2021-02-12 DIAGNOSIS — R262 Difficulty in walking, not elsewhere classified: Secondary | ICD-10-CM | POA: Diagnosis not present

## 2021-02-12 DIAGNOSIS — M6281 Muscle weakness (generalized): Secondary | ICD-10-CM | POA: Diagnosis not present

## 2021-02-12 DIAGNOSIS — R2689 Other abnormalities of gait and mobility: Secondary | ICD-10-CM | POA: Diagnosis not present

## 2021-02-12 DIAGNOSIS — H532 Diplopia: Secondary | ICD-10-CM | POA: Diagnosis not present

## 2021-02-13 DIAGNOSIS — M069 Rheumatoid arthritis, unspecified: Secondary | ICD-10-CM | POA: Diagnosis not present

## 2021-02-13 DIAGNOSIS — M6281 Muscle weakness (generalized): Secondary | ICD-10-CM | POA: Diagnosis not present

## 2021-02-13 DIAGNOSIS — J449 Chronic obstructive pulmonary disease, unspecified: Secondary | ICD-10-CM | POA: Diagnosis not present

## 2021-02-13 DIAGNOSIS — E1169 Type 2 diabetes mellitus with other specified complication: Secondary | ICD-10-CM | POA: Diagnosis not present

## 2021-02-13 DIAGNOSIS — R2681 Unsteadiness on feet: Secondary | ICD-10-CM | POA: Diagnosis not present

## 2021-02-13 DIAGNOSIS — R262 Difficulty in walking, not elsewhere classified: Secondary | ICD-10-CM | POA: Diagnosis not present

## 2021-02-13 DIAGNOSIS — E785 Hyperlipidemia, unspecified: Secondary | ICD-10-CM | POA: Diagnosis not present

## 2021-02-13 DIAGNOSIS — R2689 Other abnormalities of gait and mobility: Secondary | ICD-10-CM | POA: Diagnosis not present

## 2021-02-14 DIAGNOSIS — M6281 Muscle weakness (generalized): Secondary | ICD-10-CM | POA: Diagnosis not present

## 2021-02-14 DIAGNOSIS — R262 Difficulty in walking, not elsewhere classified: Secondary | ICD-10-CM | POA: Diagnosis not present

## 2021-02-14 DIAGNOSIS — R2681 Unsteadiness on feet: Secondary | ICD-10-CM | POA: Diagnosis not present

## 2021-02-14 DIAGNOSIS — R2689 Other abnormalities of gait and mobility: Secondary | ICD-10-CM | POA: Diagnosis not present

## 2021-02-14 DIAGNOSIS — M069 Rheumatoid arthritis, unspecified: Secondary | ICD-10-CM | POA: Diagnosis not present

## 2021-02-15 DIAGNOSIS — R2689 Other abnormalities of gait and mobility: Secondary | ICD-10-CM | POA: Diagnosis not present

## 2021-02-15 DIAGNOSIS — M069 Rheumatoid arthritis, unspecified: Secondary | ICD-10-CM | POA: Diagnosis not present

## 2021-02-15 DIAGNOSIS — R2681 Unsteadiness on feet: Secondary | ICD-10-CM | POA: Diagnosis not present

## 2021-02-15 DIAGNOSIS — R262 Difficulty in walking, not elsewhere classified: Secondary | ICD-10-CM | POA: Diagnosis not present

## 2021-02-15 DIAGNOSIS — M6281 Muscle weakness (generalized): Secondary | ICD-10-CM | POA: Diagnosis not present

## 2021-02-16 DIAGNOSIS — M6281 Muscle weakness (generalized): Secondary | ICD-10-CM | POA: Diagnosis not present

## 2021-02-16 DIAGNOSIS — R262 Difficulty in walking, not elsewhere classified: Secondary | ICD-10-CM | POA: Diagnosis not present

## 2021-02-16 DIAGNOSIS — R2689 Other abnormalities of gait and mobility: Secondary | ICD-10-CM | POA: Diagnosis not present

## 2021-02-16 DIAGNOSIS — R2681 Unsteadiness on feet: Secondary | ICD-10-CM | POA: Diagnosis not present

## 2021-02-16 DIAGNOSIS — M069 Rheumatoid arthritis, unspecified: Secondary | ICD-10-CM | POA: Diagnosis not present

## 2021-02-19 DIAGNOSIS — R262 Difficulty in walking, not elsewhere classified: Secondary | ICD-10-CM | POA: Diagnosis not present

## 2021-02-19 DIAGNOSIS — M069 Rheumatoid arthritis, unspecified: Secondary | ICD-10-CM | POA: Diagnosis not present

## 2021-02-19 DIAGNOSIS — R2681 Unsteadiness on feet: Secondary | ICD-10-CM | POA: Diagnosis not present

## 2021-02-19 DIAGNOSIS — M6281 Muscle weakness (generalized): Secondary | ICD-10-CM | POA: Diagnosis not present

## 2021-02-19 DIAGNOSIS — R2689 Other abnormalities of gait and mobility: Secondary | ICD-10-CM | POA: Diagnosis not present

## 2021-02-20 ENCOUNTER — Telehealth: Payer: Self-pay | Admitting: Physician Assistant

## 2021-02-20 DIAGNOSIS — R2689 Other abnormalities of gait and mobility: Secondary | ICD-10-CM | POA: Diagnosis not present

## 2021-02-20 DIAGNOSIS — R2681 Unsteadiness on feet: Secondary | ICD-10-CM | POA: Diagnosis not present

## 2021-02-20 DIAGNOSIS — R262 Difficulty in walking, not elsewhere classified: Secondary | ICD-10-CM | POA: Diagnosis not present

## 2021-02-20 DIAGNOSIS — M6281 Muscle weakness (generalized): Secondary | ICD-10-CM | POA: Diagnosis not present

## 2021-02-20 DIAGNOSIS — M069 Rheumatoid arthritis, unspecified: Secondary | ICD-10-CM | POA: Diagnosis not present

## 2021-02-20 NOTE — Telephone Encounter (Signed)
Rescheduled 12/14 to 12/16 due to provider pal. Patient has been called and voicemail was left.

## 2021-02-21 ENCOUNTER — Ambulatory Visit: Payer: Medicare Other | Admitting: Physician Assistant

## 2021-02-21 ENCOUNTER — Other Ambulatory Visit: Payer: Medicare Other

## 2021-02-21 DIAGNOSIS — R262 Difficulty in walking, not elsewhere classified: Secondary | ICD-10-CM | POA: Diagnosis not present

## 2021-02-21 DIAGNOSIS — R2681 Unsteadiness on feet: Secondary | ICD-10-CM | POA: Diagnosis not present

## 2021-02-21 DIAGNOSIS — M069 Rheumatoid arthritis, unspecified: Secondary | ICD-10-CM | POA: Diagnosis not present

## 2021-02-21 DIAGNOSIS — R2689 Other abnormalities of gait and mobility: Secondary | ICD-10-CM | POA: Diagnosis not present

## 2021-02-21 DIAGNOSIS — M6281 Muscle weakness (generalized): Secondary | ICD-10-CM | POA: Diagnosis not present

## 2021-02-22 ENCOUNTER — Telehealth: Payer: Self-pay | Admitting: Hematology and Oncology

## 2021-02-22 DIAGNOSIS — M069 Rheumatoid arthritis, unspecified: Secondary | ICD-10-CM | POA: Diagnosis not present

## 2021-02-22 DIAGNOSIS — R2681 Unsteadiness on feet: Secondary | ICD-10-CM | POA: Diagnosis not present

## 2021-02-22 DIAGNOSIS — M6281 Muscle weakness (generalized): Secondary | ICD-10-CM | POA: Diagnosis not present

## 2021-02-22 DIAGNOSIS — R2689 Other abnormalities of gait and mobility: Secondary | ICD-10-CM | POA: Diagnosis not present

## 2021-02-22 DIAGNOSIS — R262 Difficulty in walking, not elsewhere classified: Secondary | ICD-10-CM | POA: Diagnosis not present

## 2021-02-22 NOTE — Telephone Encounter (Signed)
Scheduled per 12/15 los, patient has been called regarding rescheduled appointments. Tried to get in contact with patient's transportation and was unable to leave a voicemail nor get in contact with anyone.

## 2021-02-23 ENCOUNTER — Other Ambulatory Visit: Payer: Medicare Other

## 2021-02-23 ENCOUNTER — Ambulatory Visit: Payer: Medicare Other | Admitting: Hematology and Oncology

## 2021-02-23 ENCOUNTER — Ambulatory Visit: Payer: Medicare Other | Admitting: Physician Assistant

## 2021-02-23 ENCOUNTER — Inpatient Hospital Stay (HOSPITAL_BASED_OUTPATIENT_CLINIC_OR_DEPARTMENT_OTHER): Payer: Medicare Other | Admitting: Hematology and Oncology

## 2021-02-23 ENCOUNTER — Other Ambulatory Visit (HOSPITAL_COMMUNITY): Payer: Self-pay | Admitting: *Deleted

## 2021-02-23 ENCOUNTER — Inpatient Hospital Stay: Payer: Medicare Other | Attending: Hematology and Oncology

## 2021-02-23 ENCOUNTER — Encounter: Payer: Self-pay | Admitting: Hematology and Oncology

## 2021-02-23 ENCOUNTER — Other Ambulatory Visit: Payer: Self-pay

## 2021-02-23 VITALS — BP 134/79 | HR 86 | Temp 98.5°F | Resp 18

## 2021-02-23 DIAGNOSIS — D638 Anemia in other chronic diseases classified elsewhere: Secondary | ICD-10-CM

## 2021-02-23 DIAGNOSIS — D509 Iron deficiency anemia, unspecified: Secondary | ICD-10-CM | POA: Insufficient documentation

## 2021-02-23 DIAGNOSIS — Z90710 Acquired absence of both cervix and uterus: Secondary | ICD-10-CM | POA: Insufficient documentation

## 2021-02-23 DIAGNOSIS — Z87891 Personal history of nicotine dependence: Secondary | ICD-10-CM | POA: Diagnosis not present

## 2021-02-23 DIAGNOSIS — R262 Difficulty in walking, not elsewhere classified: Secondary | ICD-10-CM | POA: Diagnosis not present

## 2021-02-23 DIAGNOSIS — M069 Rheumatoid arthritis, unspecified: Secondary | ICD-10-CM | POA: Diagnosis not present

## 2021-02-23 DIAGNOSIS — E119 Type 2 diabetes mellitus without complications: Secondary | ICD-10-CM | POA: Diagnosis not present

## 2021-02-23 DIAGNOSIS — Z803 Family history of malignant neoplasm of breast: Secondary | ICD-10-CM | POA: Insufficient documentation

## 2021-02-23 DIAGNOSIS — M6281 Muscle weakness (generalized): Secondary | ICD-10-CM | POA: Diagnosis not present

## 2021-02-23 DIAGNOSIS — D704 Cyclic neutropenia: Secondary | ICD-10-CM | POA: Diagnosis not present

## 2021-02-23 DIAGNOSIS — D500 Iron deficiency anemia secondary to blood loss (chronic): Secondary | ICD-10-CM | POA: Diagnosis not present

## 2021-02-23 DIAGNOSIS — R2689 Other abnormalities of gait and mobility: Secondary | ICD-10-CM | POA: Diagnosis not present

## 2021-02-23 DIAGNOSIS — R2681 Unsteadiness on feet: Secondary | ICD-10-CM | POA: Diagnosis not present

## 2021-02-23 LAB — COMPREHENSIVE METABOLIC PANEL
ALT: 23 U/L (ref 0–44)
AST: 18 U/L (ref 15–41)
Albumin: 2.9 g/dL — ABNORMAL LOW (ref 3.5–5.0)
Alkaline Phosphatase: 102 U/L (ref 38–126)
Anion gap: 8 (ref 5–15)
BUN: 22 mg/dL (ref 8–23)
CO2: 25 mmol/L (ref 22–32)
Calcium: 9 mg/dL (ref 8.9–10.3)
Chloride: 109 mmol/L (ref 98–111)
Creatinine, Ser: 0.75 mg/dL (ref 0.44–1.00)
GFR, Estimated: >60 mL/min (ref >60)
Glucose, Bld: 89 mg/dL (ref 70–99)
Potassium: 4.5 mmol/L (ref 3.5–5.1)
Sodium: 142 mmol/L (ref 135–145)
Total Bilirubin: 0.4 mg/dL (ref 0.3–1.2)
Total Protein: 6.1 g/dL — ABNORMAL LOW (ref 6.5–8.1)

## 2021-02-23 LAB — IRON AND TIBC
Iron: 27 ug/dL — ABNORMAL LOW (ref 41–142)
Saturation Ratios: 10 % — ABNORMAL LOW (ref 21–57)
TIBC: 281 ug/dL (ref 236–444)
UIBC: 254 ug/dL (ref 120–384)

## 2021-02-23 LAB — CBC WITH DIFFERENTIAL (CANCER CENTER ONLY)
Abs Immature Granulocytes: 0.08 10*3/uL — ABNORMAL HIGH (ref 0.00–0.07)
Basophils Absolute: 0.1 10*3/uL (ref 0.0–0.1)
Basophils Relative: 1 %
Eosinophils Absolute: 0.3 10*3/uL (ref 0.0–0.5)
Eosinophils Relative: 3 %
HCT: 29.3 % — ABNORMAL LOW (ref 36.0–46.0)
Hemoglobin: 9.1 g/dL — ABNORMAL LOW (ref 12.0–15.0)
Immature Granulocytes: 1 %
Lymphocytes Relative: 20 %
Lymphs Abs: 2.1 10*3/uL (ref 0.7–4.0)
MCH: 24.9 pg — ABNORMAL LOW (ref 26.0–34.0)
MCHC: 31.1 g/dL (ref 30.0–36.0)
MCV: 80.1 fL (ref 80.0–100.0)
Monocytes Absolute: 0.6 10*3/uL (ref 0.1–1.0)
Monocytes Relative: 6 %
Neutro Abs: 7.4 10*3/uL (ref 1.7–7.7)
Neutrophils Relative %: 69 %
Platelet Count: 267 10*3/uL (ref 150–400)
RBC: 3.66 MIL/uL — ABNORMAL LOW (ref 3.87–5.11)
RDW: 18.9 % — ABNORMAL HIGH (ref 11.5–15.5)
WBC Count: 10.5 10*3/uL (ref 4.0–10.5)
nRBC: 0 % (ref 0.0–0.2)

## 2021-02-23 LAB — RETIC PANEL
Immature Retic Fract: 28.9 % — ABNORMAL HIGH (ref 2.3–15.9)
RBC.: 3.66 MIL/uL — ABNORMAL LOW (ref 3.87–5.11)
Retic Count, Absolute: 60.8 10*3/uL (ref 19.0–186.0)
Retic Ct Pct: 1.7 % (ref 0.4–3.1)
Reticulocyte Hemoglobin: 23 pg — ABNORMAL LOW (ref >27.9)

## 2021-02-23 LAB — FERRITIN: Ferritin: 43 ng/mL (ref 11–307)

## 2021-02-23 NOTE — Progress Notes (Signed)
[place] NOTE  Patient Care Team: [name], MD as PCP - General (Internal Medicine) Hallows, Verlee Monte, MD as Consulting Physician (Orthopedic Surgery)  CHIEF COMPLAINTS/PURPOSE OF CONSULTATION:  Anemia.  ASSESSMENT & PLAN:   This is a pleasant 80 year old female patient with COPD, prior PE on Eliquis which was discontinued in May 2022, type 2 diabetes, rheumatoid arthritis, chronic prednisone use, obstructive sleep apnea who was seen in the hospital with acute blood loss anemia.  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  While she was in the hospital, hematology was consulted for any concern for primary bone marrow disorder. She had a bone marrow biopsy which did not show any evidence of primary hematological disorder.  She is here for a follow-up today by herself.  Since her last visit here, she was hospitalized for anemia, was found to have acute GI bleeding and required blood transfusion.  Since then she has been in a nursing facility.  She denies any new complaints today.  She feels okay, occasionally tired, occasionally lightheaded but overall she feels better. Physical examination today without any major concerns.   We have discussed that her hemoglobin today is 9 g and she does not require any transfusion at this time. She will RTC in 4 weeks for follow up with labs.  Thank you for consulting [place] in the care of this patient.  Please do not hesitate to contact us with any additional questions or concerns.  HISTORY OF PRESENTING ILLNESS:  [name] 80 y.o. female is here because of Anemia.  This is a 80 year old female patient with past medical history significant for COPD, prior PE Eliquis discontinued in May 2022, type 2 diabetes, rheumatoid arthritis with some major joint deformity on chronic low-dose prednisone, obstructive sleep apnea, recently admitted with acute blood loss anemia.  She had capsule endoscopy which  demonstrated small bowel AVM, small bowel erosions and Eliquis was discontinued during that hospital stay.  She then presented with a fall from bed on 11/07/2020 and was found to have a hemoglobin of 5.3 hence received another blood transfusion.   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   Biopsy showed hyperplastic polyps. Colonoscopy showed rectal prolapse, diverticulosis and small tubular adenoma in the descending colon. Capsule endoscopy on Oct 29, 2020 showed few small erosions in small bowel, 1 small AVM in the proximal duodenum with stigmata of recent bleeding. Because she was not thought to have any recent bleed which would explain her hemoglobin of 5.3 for this hospital admission, hematology was consulted to evaluate for any other underlying hematological process. She has had multiple ER visits and hospital admission recently for various complaints including PE, generalized weakness, blood loss anemia.  At baseline she has multiple medical comorbidities. She had BMB which didn't show evidence of any primary hematological disorder. Although Dr Gari Crown felt there could be hemolysis based on smear findings, her T bili is completely normal, not indicative of hemolysis. On further discussion, he suggested that these could also be autoimmune related changes.  Interval history  She is here for follow-up by herself.  She denies any new health complaints since last visit.  She was admitted for acute anemia in September and required blood transfusion.  She occasionally feels tired and dizzy but most of the times has been feeling good lately.  She denies any blood  in her stool or black stool.  Arthritis has been bothersome to her.  Could not review all medications, patient is not quite sure about what all she takes Rest of the pertinent 10 point ROS reviewed and negative.   MEDICAL HISTORY:  Past  Medical History:  Diagnosis Date   Acute pulmonary embolism ([place])    b/l; dx'ed May '22   Asthma    Complication of anesthesia    Anesthesia told her years ago she got too cold during surgery   Diabetes mellitus without complication ([place])    Emphysema    Fibromyalgia    Osteoporosis    Rheumatoid arthritis(714.0)    Sleep apnea    no cpap    SURGICAL HISTORY: Past Surgical History:  Procedure Laterality Date   ABDOMINAL HYSTERECTOMY     ANKLE FRACTURE SURGERY  2007   BREAST SURGERY     mammoplasty and then removed   COLONOSCOPY WITH PROPOFOL N/A 08/22/2020   Procedure: COLONOSCOPY WITH PROPOFOL;  Surgeon: Ronnette Juniper, MD;  Location: [facility] ENDOSCOPY;  Service: Gastroenterology;  Laterality: N/A;  If schedule availability permits, would like to add on upper endoscopy as well   ENTEROSCOPY N/A 11/11/2020   Procedure: ENTEROSCOPY;  Surgeon: Arta Silence, MD;  Location: [facility] ENDOSCOPY;  Service: Endoscopy;  Laterality: N/A;   ESOPHAGOGASTRODUODENOSCOPY (EGD) WITH PROPOFOL N/A 08/22/2020   Procedure: ESOPHAGOGASTRODUODENOSCOPY (EGD) WITH PROPOFOL;  Surgeon: Ronnette Juniper, MD;  Location: [facility] ENDOSCOPY;  Service: Gastroenterology;  Laterality: N/A;   FOOT SURGERY     numerous   GIVENS CAPSULE STUDY N/A 10/28/2020   Procedure: GIVENS CAPSULE STUDY;  Surgeon: Otis Brace, MD;  Location: [facility] ENDOSCOPY;  Service: Gastroenterology;  Laterality: N/A;   HOT HEMOSTASIS N/A 08/22/2020   Procedure: HOT HEMOSTASIS (ARGON PLASMA COAGULATION/BICAP);  Surgeon: Ronnette Juniper, MD;  Location: [facility];  Service: Gastroenterology;  Laterality: N/A;   I & D EXTREMITY Right 10/26/2014   Procedure: IRRIGATION AND DEBRIDEMENT RIGHT KNEE ;  Surgeon: Gaynelle Arabian, MD;  Location: [facility] ORS;  Service: Orthopedics;  Laterality: Right;   IRRIGATION AND DEBRIDEMENT KNEE Right 03/09/2017   Procedure: IRRIGATION AND DEBRIDEMENT RIGHT KNEE PRE-PATELLAR BURSA;  Surgeon: Susa Day, MD;  Location: [place];  Service:  Orthopedics;  Laterality: Right;   KNEE BURSECTOMY Right 09/16/2014   Procedure: EXCISON OF PRE PATELLA BURSA RIGHT KNEE ;  Surgeon: Gaynelle Arabian, MD;  Location: [facility] ORS;  Service: Orthopedics;  Laterality: Right;   PARTIAL HYSTERECTOMY  1970   POLYPECTOMY  08/22/2020   Procedure: POLYPECTOMY;  Surgeon: Ronnette Juniper, MD;  Location: [facility] ENDOSCOPY;  Service: Gastroenterology;;   TOTAL KNEE ARTHROPLASTY  2005 / 2006   x2    SOCIAL HISTORY: Social History   Socioeconomic History   Marital status: Divorced    Spouse name: Not on file   Number of children: Not on file   Years of education: Not on file   Highest education level: Not on file  Occupational History   Not on file  Tobacco Use   Smoking status: Former    Types: Cigarettes    Quit date: 03/11/1994    Years since quitting: 26.9   Smokeless tobacco: Never  Vaping Use   Vaping Use: Never used  Substance and Sexual Activity   Alcohol use: No   Drug use: No   Sexual activity: Not on file  Other Topics Concern   Not on file  Social History Narrative   Not on file   Social Determinants of Health  Financial Resource Strain: Not on file  Food Insecurity: No Food Insecurity   Worried About Charity fundraiser in the Last Year: Never true   Arboriculturist in the Last Year: Never true  Transportation Needs: Public librarian (Medical): Yes   Lack of Transportation (Non-Medical): Yes  Physical Activity: Not on file  Stress: Not on file  Social Connections: Not on file  Intimate Partner Violence: Not on file    FAMILY HISTORY: Family History  Problem Relation Age of Onset   Hypertension Other    Diabetes Other    Stroke Other    Diabetes Mother    Diabetes Father    Diabetes Sister    Diabetes Maternal Aunt    Cancer Paternal Uncle    Cancer Paternal Grandmother    Breast cancer Paternal Grandmother     ALLERGIES:  is allergic to penicillins, sulfamethoxazole-trimethoprim,  adalimumab, hydroxychloroquine sulfate, meperidine, meperidine hcl, methotrexate, penicillin g sodium, phentermine-topiramate, sulfa antibiotics, and sulfonamide derivatives.  MEDICATIONS:  Current Outpatient Medications  Medication Sig Dispense Refill   acetaminophen (TYLENOL) 500 MG tablet Take 1,000 mg by mouth every 6 (six) hours as needed for mild pain (or headaches).     arformoterol (BROVANA) 15 MCG/2ML NEBU Take 15 mcg by nebulization 2 (two) times daily as needed (for shortness of breath).     atorvastatin (LIPITOR) 20 MG tablet Take 20 mg by mouth daily.     B-D UF III MINI PEN NEEDLES 31G X 5 MM MISC SMARTSIG:1 Each SUB-Q 4 Times Daily     budesonide (PULMICORT) 0.25 MG/2ML nebulizer solution Take 2 mLs (0.25 mg total) by nebulization 2 (two) times daily. 120 mL 0   Cyanocobalamin (B-12) 2500 MCG TABS Take 2,500 mcg by mouth at bedtime.      DULoxetine (CYMBALTA) 60 MG capsule Take 60 mg by mouth at bedtime.      ezetimibe (ZETIA) 10 MG tablet Take 10 mg by mouth at bedtime.      ferrous gluconate (FERGON) 324 MG tablet Take 324 mg by mouth every other day.     ipratropium-albuterol (DUONEB) 0.5-2.5 (3) MG/3ML SOLN Take 3 mLs by nebulization every 6 (six) hours as needed (wheezing).     ketorolac (ACULAR) 0.5 % ophthalmic solution Place 1 drop into both eyes 4 (four) times daily. 5 mL 0   loperamide (IMODIUM) 2 MG capsule Take by mouth as needed for diarrhea or loose stools.     metFORMIN (GLUCOPHAGE) 500 MG tablet Take 500 mg by mouth 2 (two) times daily.     NOVOLIN 70/30 KWIKPEN (70-30) 100 UNIT/ML KwikPen Inject 10 Units into the skin 2 (two) times daily.     ondansetron (ZOFRAN-ODT) 8 MG disintegrating tablet Take 8 mg by mouth every 8 (eight) hours as needed for nausea or vomiting (dissolve orally).     pantoprazole (PROTONIX) 40 MG tablet Take one tablet once every morning (Patient taking differently: Take 40 mg by mouth daily before breakfast.) 30 tablet 5   predniSONE  (DELTASONE) 5 MG tablet Take 5 mg by mouth at bedtime.     psyllium (METAMUCIL) 58.6 % packet Take 1 packet by mouth daily. 30 each 12   tolterodine (DETROL LA) 2 MG 24 hr capsule Take 2 mg by mouth every evening.     No current facility-administered medications for this visit.   PHYSICAL EXAMINATION: ECOG PERFORMANCE STATUS: 3 - Symptomatic, >50% confined to bed  Vitals:   02/23/21  1321  BP: 134/79  Pulse: 86  Resp: 18  Temp: 98.5 F (36.9 C)  SpO2: 98%   Filed Weights    GENERAL:alert, no distress and comfortable, in a wheel chair SKIN: skin color, texture, turgor are normal, no rashes or significant lesions Neck: no palpable adenopathy Chest: CTA bilaterally Heart: RRR Abdomen: soft, non tender Ext: no edema MSK: Severe RA changes noted.  LABORATORY DATA:  I have reviewed the data as listed Lab Results  Component Value Date   WBC 10.5 02/23/2021   HGB 9.1 (L) 02/23/2021   HCT 29.3 (L) 02/23/2021   MCV 80.1 02/23/2021   PLT 267 02/23/2021     Chemistry      Component Value Date/Time   NA 142 11/30/2020 1021   K 4.4 11/30/2020 1021   CL 106 11/30/2020 1021   CO2 24 11/30/2020 1021   BUN 25 (H) 11/30/2020 1021   CREATININE 0.88 11/30/2020 1021   CREATININE 1.16 (H) 04/08/2017 1025      Component Value Date/Time   CALCIUM 9.5 11/30/2020 1021   ALKPHOS 88 11/30/2020 1021   AST 11 (L) 11/30/2020 1021   ALT 6 11/30/2020 1021   BILITOT 0.4 11/30/2020 1021       RADIOGRAPHIC STUDIES: I have personally reviewed the radiological images as listed and agreed with the findings in the report. No results found.   11/15/2020  DIAGNOSIS:   BONE MARROW, ASPIRATE, CLOT, CORE:  -Hypercellular bone marrow for age with trilineage hematopoiesis  -See comment   PERIPHERAL BLOOD:  -Microcytic-hypochromic anemia  -Mild neutrophilic left shift   COMMENT:   The bone marrow is hypercellular for age with trilineage hematopoiesis  and nonspecific myeloid changes  likely secondary in nature.  No increase  in blastic cells identified.  The peripheral blood shows morphologic  features suggestive of hemolysis.  Clinical and cytogenetic correlation  is recommended.   Cytogenetics: Normal female karyotype.  No splenomegaly on recent abdominal imaging.  Labs from today reviewed White blood cell count 10,500, hemoglobin 9.1, platelet count of 260,000 Ferritin of 43 today Comprehensive metabolic panel normal  All questions were answered. The patient knows to call the clinic with any problems, questions or concerns. I spent 20 minutes in the care of this patient including H and P, review of records, counseling and coordination of care.     Benay Pike, MD 02/23/2021 1:26 PM

## 2021-02-24 DIAGNOSIS — M069 Rheumatoid arthritis, unspecified: Secondary | ICD-10-CM | POA: Diagnosis not present

## 2021-02-24 DIAGNOSIS — R262 Difficulty in walking, not elsewhere classified: Secondary | ICD-10-CM | POA: Diagnosis not present

## 2021-02-24 DIAGNOSIS — R2689 Other abnormalities of gait and mobility: Secondary | ICD-10-CM | POA: Diagnosis not present

## 2021-02-24 DIAGNOSIS — M6281 Muscle weakness (generalized): Secondary | ICD-10-CM | POA: Diagnosis not present

## 2021-02-24 DIAGNOSIS — R2681 Unsteadiness on feet: Secondary | ICD-10-CM | POA: Diagnosis not present

## 2021-02-25 DIAGNOSIS — R2681 Unsteadiness on feet: Secondary | ICD-10-CM | POA: Diagnosis not present

## 2021-02-25 DIAGNOSIS — M6281 Muscle weakness (generalized): Secondary | ICD-10-CM | POA: Diagnosis not present

## 2021-02-25 DIAGNOSIS — M069 Rheumatoid arthritis, unspecified: Secondary | ICD-10-CM | POA: Diagnosis not present

## 2021-02-25 DIAGNOSIS — R262 Difficulty in walking, not elsewhere classified: Secondary | ICD-10-CM | POA: Diagnosis not present

## 2021-02-25 DIAGNOSIS — R2689 Other abnormalities of gait and mobility: Secondary | ICD-10-CM | POA: Diagnosis not present

## 2021-02-26 ENCOUNTER — Encounter (HOSPITAL_COMMUNITY)
Admission: RE | Admit: 2021-02-26 | Discharge: 2021-02-26 | Disposition: A | Discharge status: Home / Self Care | Payer: Medicare Other | Source: Ambulatory Visit | Attending: Internal Medicine | Admitting: Internal Medicine

## 2021-02-26 ENCOUNTER — Other Ambulatory Visit: Payer: Self-pay

## 2021-02-26 DIAGNOSIS — M810 Age-related osteoporosis without current pathological fracture: Secondary | ICD-10-CM | POA: Insufficient documentation

## 2021-02-26 DIAGNOSIS — R262 Difficulty in walking, not elsewhere classified: Secondary | ICD-10-CM | POA: Diagnosis not present

## 2021-02-26 DIAGNOSIS — R2689 Other abnormalities of gait and mobility: Secondary | ICD-10-CM | POA: Diagnosis not present

## 2021-02-26 DIAGNOSIS — M069 Rheumatoid arthritis, unspecified: Secondary | ICD-10-CM | POA: Diagnosis not present

## 2021-02-26 DIAGNOSIS — R2681 Unsteadiness on feet: Secondary | ICD-10-CM | POA: Diagnosis not present

## 2021-02-26 DIAGNOSIS — M6281 Muscle weakness (generalized): Secondary | ICD-10-CM | POA: Diagnosis not present

## 2021-02-26 MED ORDER — DENOSUMAB 60 MG/ML ~~LOC~~ SOSY: 60.0000 mg | PREFILLED_SYRINGE | Freq: Once | SUBCUTANEOUS | Status: AC

## 2021-02-26 MED ORDER — DENOSUMAB 60 MG/ML ~~LOC~~ SOSY
PREFILLED_SYRINGE | SUBCUTANEOUS | Status: AC
  Administered 2021-02-26: 14:00:00 60 mg via SUBCUTANEOUS
  Filled 2021-02-26: qty 1

## 2021-02-28 DIAGNOSIS — I10 Essential (primary) hypertension: Secondary | ICD-10-CM | POA: Diagnosis not present

## 2021-03-01 DIAGNOSIS — K625 Hemorrhage of anus and rectum: Secondary | ICD-10-CM | POA: Diagnosis not present

## 2021-03-01 DIAGNOSIS — D649 Anemia, unspecified: Secondary | ICD-10-CM | POA: Diagnosis not present

## 2021-03-02 DIAGNOSIS — I10 Essential (primary) hypertension: Secondary | ICD-10-CM | POA: Diagnosis not present

## 2021-04-02 ENCOUNTER — Other Ambulatory Visit: Payer: Self-pay | Admitting: Physician Assistant

## 2021-04-02 DIAGNOSIS — D638 Anemia in other chronic diseases classified elsewhere: Secondary | ICD-10-CM

## 2021-04-03 ENCOUNTER — Other Ambulatory Visit: Payer: Self-pay

## 2021-04-03 ENCOUNTER — Inpatient Hospital Stay (HOSPITAL_BASED_OUTPATIENT_CLINIC_OR_DEPARTMENT_OTHER): Payer: Medicare PPO | Admitting: Physician Assistant

## 2021-04-03 ENCOUNTER — Inpatient Hospital Stay: Payer: Medicare PPO | Attending: Hematology and Oncology | Admitting: Physician Assistant

## 2021-04-03 DIAGNOSIS — Z803 Family history of malignant neoplasm of breast: Secondary | ICD-10-CM | POA: Insufficient documentation

## 2021-04-03 DIAGNOSIS — Z87891 Personal history of nicotine dependence: Secondary | ICD-10-CM | POA: Diagnosis not present

## 2021-04-03 DIAGNOSIS — D638 Anemia in other chronic diseases classified elsewhere: Secondary | ICD-10-CM

## 2021-04-03 DIAGNOSIS — D500 Iron deficiency anemia secondary to blood loss (chronic): Secondary | ICD-10-CM

## 2021-04-03 DIAGNOSIS — E119 Type 2 diabetes mellitus without complications: Secondary | ICD-10-CM | POA: Insufficient documentation

## 2021-04-03 DIAGNOSIS — M069 Rheumatoid arthritis, unspecified: Secondary | ICD-10-CM | POA: Insufficient documentation

## 2021-04-03 DIAGNOSIS — D509 Iron deficiency anemia, unspecified: Secondary | ICD-10-CM | POA: Insufficient documentation

## 2021-04-03 DIAGNOSIS — Z90710 Acquired absence of both cervix and uterus: Secondary | ICD-10-CM | POA: Insufficient documentation

## 2021-04-03 LAB — CBC WITH DIFFERENTIAL (CANCER CENTER ONLY)
Abs Immature Granulocytes: 0.05 10*3/uL (ref 0.00–0.07)
Basophils Absolute: 0.1 10*3/uL (ref 0.0–0.1)
Basophils Relative: 1 %
Eosinophils Absolute: 0.4 10*3/uL (ref 0.0–0.5)
Eosinophils Relative: 3 %
HCT: 29.7 % — ABNORMAL LOW (ref 36.0–46.0)
Hemoglobin: 8.6 g/dL — ABNORMAL LOW (ref 12.0–15.0)
Immature Granulocytes: 1 %
Lymphocytes Relative: 16 %
Lymphs Abs: 1.8 10*3/uL (ref 0.7–4.0)
MCH: 23.1 pg — ABNORMAL LOW (ref 26.0–34.0)
MCHC: 29 g/dL — ABNORMAL LOW (ref 30.0–36.0)
MCV: 79.8 fL — ABNORMAL LOW (ref 80.0–100.0)
Monocytes Absolute: 0.7 10*3/uL (ref 0.1–1.0)
Monocytes Relative: 7 %
Neutro Abs: 7.8 10*3/uL — ABNORMAL HIGH (ref 1.7–7.7)
Neutrophils Relative %: 72 %
Platelet Count: 351 10*3/uL (ref 150–400)
RBC: 3.72 MIL/uL — ABNORMAL LOW (ref 3.87–5.11)
RDW: 17.3 % — ABNORMAL HIGH (ref 11.5–15.5)
WBC Count: 10.8 10*3/uL — ABNORMAL HIGH (ref 4.0–10.5)
nRBC: 0 % (ref 0.0–0.2)

## 2021-04-03 LAB — IRON AND IRON BINDING CAPACITY (CC-WL,HP ONLY)
Iron: 14 ug/dL — ABNORMAL LOW (ref 28–170)
Saturation Ratios: 5 % — ABNORMAL LOW (ref 10.4–31.8)
TIBC: 276 ug/dL (ref 250–450)
UIBC: 262 ug/dL (ref 148–442)

## 2021-04-03 LAB — FERRITIN: Ferritin: 68 ng/mL (ref 11–307)

## 2021-04-03 NOTE — Progress Notes (Signed)
[place] [phone]   [phone]  PROGRESS NOTE  Patient Care Team: [name], MD as PCP - General (Internal Medicine) Hallows, Rhett K, MD as Consulting Physician (Orthopedic Surgery)   CHIEF COMPLAINTS/PURPOSE OF CONSULTATION:  Microcytic anemia  HISTORY OF PRESENTING ILLNESS:  [name] 81 y.o. female returns for a follow up for microcytic anemia. She was last seen by Dr. Chryl Heck on 11/30/2021. In the interim, she denies any changes to her health.   On exam today, [name] reports that she has noticed a worsening fatigue for the last 2 to 3 weeks.  She has been in bed for most of the day.  Her appetite is fair.  She denies any nausea, vomiting or abdominal pain.  Patient reports chronic doing pain involving her shoulders, neck and hands.  She currently takes oxycodone 10 mg once a day provides minimal improvement of pain.  Patient reports chronic diarrhea with up to 4-5 episodes per day.  She currently takes Imodium as needed with some improvement of symptoms.  Patient reports intermittent episodes of blood on the toilet paper when she wipes, that she suspects is coming from hemorrhoids.  She reports worsening shortness of breath with exertion but none at rest.  Patient has episodes of dizziness but no syncopal episodes.  Patient denies any fevers, chills, night sweats, chest pain or cough.  She has no other complaints.  Remaining 10 point ROS is below.  MEDICAL HISTORY:  Past Medical History:  Diagnosis Date   Acute pulmonary embolism ([place])    b/l; dx'ed May '22   Asthma    Complication of anesthesia    Anesthesia told her years ago she got too cold during surgery   Diabetes mellitus without complication ([place])    Emphysema    Fibromyalgia    Osteoporosis    Rheumatoid arthritis(714.0)    Sleep apnea    no cpap    SURGICAL HISTORY: Past Surgical History:  Procedure Laterality Date   ABDOMINAL HYSTERECTOMY     ANKLE FRACTURE SURGERY   2007   BREAST SURGERY     mammoplasty and then removed   COLONOSCOPY WITH PROPOFOL N/A 08/22/2020   Procedure: COLONOSCOPY WITH PROPOFOL;  Surgeon: Ronnette Juniper, MD;  Location: [facility];  Service: Gastroenterology;  Laterality: N/A;  If schedule availability permits, would like to add on upper endoscopy as well   ENTEROSCOPY N/A 11/11/2020   Procedure: ENTEROSCOPY;  Surgeon: Arta Silence, MD;  Location: [facility] ENDOSCOPY;  Service: Endoscopy;  Laterality: N/A;   ESOPHAGOGASTRODUODENOSCOPY (EGD) WITH PROPOFOL N/A 08/22/2020   Procedure: ESOPHAGOGASTRODUODENOSCOPY (EGD) WITH PROPOFOL;  Surgeon: Ronnette Juniper, MD;  Location: [facility] ENDOSCOPY;  Service: Gastroenterology;  Laterality: N/A;   FOOT SURGERY     numerous   GIVENS CAPSULE STUDY N/A 10/28/2020   Procedure: GIVENS CAPSULE STUDY;  Surgeon: Otis Brace, MD;  Location: [facility];  Service: Gastroenterology;  Laterality: N/A;   HOT HEMOSTASIS N/A 08/22/2020   Procedure: HOT HEMOSTASIS (ARGON PLASMA COAGULATION/BICAP);  Surgeon: Ronnette Juniper, MD;  Location: [facility];  Service: Gastroenterology;  Laterality: N/A;   I & D EXTREMITY Right 10/26/2014   Procedure: IRRIGATION AND DEBRIDEMENT RIGHT KNEE ;  Surgeon: Gaynelle Arabian, MD;  Location: [facility]S;  Service: Orthopedics;  Laterality: Right;   IRRIGATION AND DEBRIDEMENT KNEE Right 03/09/2017   Procedure: IRRIGATION AND DEBRIDEMENT RIGHT KNEE PRE-PATELLAR BURSA;  Surgeon: Susa Day, MD;  Location: [facility];  Service: Orthopedics;  Laterality: Right;   KNEE BURSECTOMY Right  09/16/2014   Procedure: Nash Shearer OF PRE PATELLA BURSA RIGHT KNEE ;  Surgeon: Gaynelle Arabian, MD;  Location: [facility];  Service: Orthopedics;  Laterality: Right;   PARTIAL HYSTERECTOMY  1970   POLYPECTOMY  08/22/2020   Procedure: POLYPECTOMY;  Surgeon: Ronnette Juniper, MD;  Location: [facility];  Service: Gastroenterology;;   TOTAL KNEE ARTHROPLASTY  2005 / 2006   x2    SOCIAL HISTORY: Social History   Socioeconomic History    Marital status: Divorced    Spouse name: Not on file   Number of children: Not on file   Years of education: Not on file   Highest education level: Not on file  Occupational History   Not on file  Tobacco Use   Smoking status: Former    Types: Cigarettes    Quit date: 03/11/1994    Years since quitting: 27.0   Smokeless tobacco: Never  Vaping Use   Vaping Use: Never used  Substance and Sexual Activity   Alcohol use: No   Drug use: No   Sexual activity: Not on file  Other Topics Concern   Not on file  Social History Narrative   Not on file   Social Determinants of Health   Financial Resource Strain: Not on file  Food Insecurity: No Food Insecurity   Worried About Charity fundraiser in the Last Year: Never true   Ran Out of Food in the Last Year: Never true  Transportation Needs: Unmet Transportation Needs   Lack of Transportation (Medical): Yes   Lack of Transportation (Non-Medical): Yes  Physical Activity: Not on file  Stress: Not on file  Social Connections: Not on file  Intimate Partner Violence: Not on file    FAMILY HISTORY: Family History  Problem Relation Age of Onset   Hypertension Other    Diabetes Other    Stroke Other    Diabetes Mother    Diabetes Father    Diabetes Sister    Diabetes Maternal Aunt    Cancer Paternal Uncle    Cancer Paternal Grandmother    Breast cancer Paternal Grandmother     ALLERGIES:  is allergic to penicillins, sulfamethoxazole-trimethoprim, adalimumab, hydroxychloroquine sulfate, meperidine, meperidine hcl, methotrexate, penicillin g sodium, phentermine-topiramate, sulfa antibiotics, and sulfonamide derivatives.  MEDICATIONS:  Current Outpatient Medications  Medication Sig Dispense Refill   acetaminophen (TYLENOL) 500 MG tablet Take 1,000 mg by mouth every 6 (six) hours as needed for mild pain (or headaches).     arformoterol (BROVANA) 15 MCG/2ML NEBU Take 15 mcg by nebulization 2 (two) times daily as needed (for shortness  of breath).     atorvastatin (LIPITOR) 20 MG tablet Take 20 mg by mouth daily.     B-D UF III MINI PEN NEEDLES 31G X 5 MM MISC SMARTSIG:1 Each SUB-Q 4 Times Daily     budesonide (PULMICORT) 0.25 MG/2ML nebulizer solution Take 2 mLs (0.25 mg total) by nebulization 2 (two) times daily. 120 mL 0   Cyanocobalamin (B-12) 2500 MCG TABS Take 2,500 mcg by mouth at bedtime.      DULoxetine (CYMBALTA) 60 MG capsule Take 60 mg by mouth at bedtime.      ezetimibe (ZETIA) 10 MG tablet Take 10 mg by mouth at bedtime.      ferrous gluconate (FERGON) 324 MG tablet Take 324 mg by mouth every other day.     ipratropium-albuterol (DUONEB) 0.5-2.5 (3) MG/3ML SOLN Take 3 mLs by nebulization every 6 (six) hours as needed (wheezing).  ketorolac (ACULAR) 0.5 % ophthalmic solution Place 1 drop into both eyes 4 (four) times daily. 5 mL 0   loperamide (IMODIUM) 2 MG capsule Take by mouth as needed for diarrhea or loose stools.     metFORMIN (GLUCOPHAGE) 500 MG tablet Take 500 mg by mouth 2 (two) times daily.     NOVOLIN 70/30 KWIKPEN (70-30) 100 UNIT/ML KwikPen Inject 10 Units into the skin 2 (two) times daily.     ondansetron (ZOFRAN-ODT) 8 MG disintegrating tablet Take 8 mg by mouth every 8 (eight) hours as needed for nausea or vomiting (dissolve orally).     oxyCODONE (OXYCONTIN) 10 mg 12 hr tablet Take 10 mg by mouth daily.     pantoprazole (PROTONIX) 40 MG tablet Take one tablet once every morning (Patient taking differently: Take 40 mg by mouth daily before breakfast.) 30 tablet 5   predniSONE (DELTASONE) 5 MG tablet Take 5 mg by mouth at bedtime.     psyllium (METAMUCIL) 58.6 % packet Take 1 packet by mouth daily. 30 each 12   tolterodine (DETROL LA) 2 MG 24 hr capsule Take 2 mg by mouth every evening.     No current facility-administered medications for this visit.    REVIEW OF SYSTEMS:   Constitutional: ( - ) fevers, ( - )  chills , ( - ) night sweats Eyes: ( - ) blurriness of vision, ( - ) double vision,  ( - ) watery eyes Ears, nose, mouth, throat, and face: ( - ) mucositis, ( - ) sore throat Respiratory: ( - ) cough, ( + ) dyspnea, ( - ) wheezes Cardiovascular: ( - ) palpitation, ( - ) chest discomfort, ( - ) lower extremity swelling Gastrointestinal:  ( - ) nausea, ( - ) heartburn, ( + ) change in bowel habits Skin: ( - ) abnormal skin rashes Lymphatics: ( - ) new lymphadenopathy, ( - ) easy bruising Neurological: ( - ) numbness, ( - ) tingling, ( - ) new weaknesses Behavioral/Psych: ( - ) mood change, ( - ) new changes  All other systems were reviewed with the patient and are negative.  PHYSICAL EXAMINATION: ECOG PERFORMANCE STATUS: 3 - Symptomatic, >50% confined to bed  Vitals:   04/03/21 1520  BP: 107/60  Pulse: 72  Resp: 15  Temp: 97.9 F (36.6 C)  SpO2: 98%   Filed Weights    GENERAL: female in NAD, elderly SKIN: skin color, texture, turgor are normal, no rashes or significant lesions EYES: conjunctiva are pink and non-injected, sclera clear LUNGS: clear to auscultation and percussion with normal breathing effort HEART: regular rate & rhythm and no murmurs and no lower extremity edema ABDOMEN: soft, non-tender, non-distended, normal bowel sounds Musculoskeletal: no cyanosis of digits and no clubbing  PSYCH: alert & oriented x 3, fluent speech NEURO: no focal motor/sensory deficits  LABORATORY DATA:  I have reviewed the data as listed CBC Latest Ref Rng & Units 04/03/2021 02/23/2021 11/30/2020  WBC 4.0 - 10.5 K/uL 10.8(H) 10.5 9.5  Hemoglobin 12.0 - 15.0 g/dL 8.6(L) 9.1(L) 10.3(L)  Hematocrit 36.0 - 46.0 % 29.7(L) 29.3(L) 35.6(L)  Platelets 150 - 400 K/uL 351 267 458(H)    CMP Latest Ref Rng & Units 02/23/2021 11/30/2020 11/13/2020  Glucose 70 - 99 mg/dL 89 114(H) 148(H)  BUN 8 - 23 mg/dL 22 25(H) 16  Creatinine 0.44 - 1.00 mg/dL 0.75 0.88 0.87  Sodium 135 - 145 mmol/L 142 142 140  Potassium 3.5 - 5.1 mmol/L 4.5 4.4 3.7  Chloride 98 - 111 mmol/L 109 106 111  CO2  22 - 32 mmol/L 25 24 22   Calcium 8.9 - 10.3 mg/dL 9.0 9.5 8.3(L)  Total Protein 6.5 - 8.1 g/dL 6.1(L) 6.4(L) 4.6(L)  Total Bilirubin 0.3 - 1.2 mg/dL 0.4 0.4 0.1(L)  Alkaline Phos 38 - 126 U/L 102 88 94  AST 15 - 41 U/L 18 11(L) 40  ALT 0 - 44 U/L 23 6 20    ASSESSMENT & PLAN [name] is a 81 y.o. female who presents for a follow up for microcytic anemia.   #Iron deficiency 2/2 GI bleed.  --Admitted from 10/27/20-10/30/20 due to acute blood loss anemia. Underwent capsule endoscopy that showed small bowel erosions, small bowel AVM. Eliquis was discontinued that hospital stay after discussion pulmonary as CT chest showed resolution of thrombus --Admitted from 11/07/2020-11/14/2020 due anemia with rectal bleeding. Hgb dropped from 8 to 5.3. She received 2 units of PRBC. Undrewent small bowel enteroscopy that was nonrevealing. Received IV ferrlecit 250 mg on 11/12/2020.  --Currently on ferrous sulfate 325 mg once a day.  --Labs today today reveal worsening anemia with Hgb 8.6, MCV 79.8. Iron panel reveals serum iron 14, iron saturation 5% and ferritin 68.  --Recommend to proceed with IV venofer 200 mg once a week x 5 doses. Arrange IV iron infusions at [facility] infusion center.  --We will reach out to Dr. Alessandra Bevels from [facility] to notify of worsening anemia.  --RTC in 10 weeks for a follow up visit with Dr. Chryl Heck and labs.   #Joint pain: --Likely secondary to OA and RA.  --Patient is reports worsening joint pain.  --She is scheduled for a consultation with Dr. Georga Bora from [facility] on 04/06/2021.  --Recommend to continue on current pain regimen as prescribed by provider at current residential facility [facility])   No orders of the defined types were placed in this encounter.   All questions were answered. The patient knows to call the clinic with any problems, questions or concerns.  I have spent a total of 30 minutes minutes of face-to-face and  non-face-to-face time, preparing to see the patient,  performing a medically appropriate examination, counseling and educating the patient, ordering medications/tests, communicating with other health care professionals, documenting clinical information in the electronic health record, and care coordination.   Dede Query, PA-C Department of Hematology/Oncology [place] at [facility] Phone: [phone]

## 2021-04-04 ENCOUNTER — Telehealth: Payer: Self-pay | Admitting: Pharmacy Technician

## 2021-04-04 NOTE — Telephone Encounter (Signed)
Dr. Charlies Silvers,  Auth Submission: DENIED Payer: [employer] MEDICARE/[employer]ARP Medication & CPT/J Code(s) submitted: MONOFERRIC Route of submission (phone, fax, portal): PHONE Auth type: Buy/Bill Units/visits requested: 1 Reference number: [id number]  Denied due to patient has not tried and or failed step therapy. Venofer Infed Ferrlecit  Would you like to try venofer??? If agreeable please re-enter the therapy plan and will we schedule the patient as soon as possible.

## 2021-04-05 ENCOUNTER — Encounter: Payer: Self-pay | Admitting: Physician Assistant

## 2021-04-05 ENCOUNTER — Encounter: Payer: Self-pay | Admitting: *Deleted

## 2021-04-05 ENCOUNTER — Other Ambulatory Visit: Payer: Self-pay | Admitting: Pharmacy Technician

## 2021-04-05 NOTE — Progress Notes (Signed)
Labs faxed to Dr. Alessandra Bevels and advised of iron infusions, in the event he should want her to follow up with him, as well.

## 2021-04-05 NOTE — Progress Notes (Addendum)
Patient called and is aware to expect a call from infusion center to schedule iron infusions and will see Dr Chryl Heck in 10 weeks.

## 2021-04-06 ENCOUNTER — Encounter: Payer: Self-pay | Admitting: Physician Assistant

## 2021-04-10 ENCOUNTER — Other Ambulatory Visit: Payer: Self-pay

## 2021-04-10 ENCOUNTER — Ambulatory Visit (INDEPENDENT_AMBULATORY_CARE_PROVIDER_SITE_OTHER): Payer: PRIVATE HEALTH INSURANCE

## 2021-04-10 VITALS — BP 121/75 | HR 58 | Temp 98.3°F | Resp 18 | Ht 65.0 in | Wt 133.0 lb

## 2021-04-10 DIAGNOSIS — D500 Iron deficiency anemia secondary to blood loss (chronic): Secondary | ICD-10-CM

## 2021-04-10 MED ORDER — FAMOTIDINE IN NACL 20-0.9 MG/50ML-% IV SOLN: 20.0000 mg | Freq: Once | INTRAVENOUS | Status: DC | PRN

## 2021-04-10 MED ORDER — SODIUM CHLORIDE 0.9 % IV SOLN: Freq: Once | INTRAVENOUS | Status: DC | PRN

## 2021-04-10 MED ORDER — DIPHENHYDRAMINE HCL 50 MG/ML IJ SOLN: 50.0000 mg | Freq: Once | INTRAMUSCULAR | Status: DC | PRN

## 2021-04-10 MED ORDER — SODIUM CHLORIDE 0.9 % IV SOLN
200.0000 mg | Freq: Once | INTRAVENOUS | Status: AC
  Administered 2021-04-10: 200 mg via INTRAVENOUS
  Filled 2021-04-10: qty 10

## 2021-04-10 MED ORDER — ALBUTEROL SULFATE HFA 108 (90 BASE) MCG/ACT IN AERS: 2.0000 | INHALATION_SPRAY | Freq: Once | RESPIRATORY_TRACT | Status: DC | PRN

## 2021-04-10 MED ORDER — METHYLPREDNISOLONE SODIUM SUCC 125 MG IJ SOLR: 125.0000 mg | Freq: Once | INTRAMUSCULAR | Status: DC | PRN

## 2021-04-10 MED ORDER — EPINEPHRINE 0.3 MG/0.3ML IJ SOAJ: 0.3000 mg | Freq: Once | INTRAMUSCULAR | Status: DC | PRN

## 2021-04-10 NOTE — Progress Notes (Signed)
Diagnosis: Iron Deficiency Anemia  Provider:  Marshell Garfinkel, MD  Procedure: Infusion  IV Type: Peripheral, IV Location: R Hand  Venofer (Iron Sucrose), Dose: 200 mg  Infusion Start Time: 9390  Infusion Stop Time: 3009  Post Infusion IV Care: Observation period completed and Peripheral IV Discontinued  Discharge: Condition: Good, Destination: Home . AVS provided to patient.   Performed by:  Koren Shiver, RN

## 2021-04-10 NOTE — Patient Instructions (Signed)

## 2021-04-17 ENCOUNTER — Ambulatory Visit: Payer: PRIVATE HEALTH INSURANCE

## 2021-04-18 ENCOUNTER — Other Ambulatory Visit: Payer: Self-pay

## 2021-04-18 ENCOUNTER — Ambulatory Visit (INDEPENDENT_AMBULATORY_CARE_PROVIDER_SITE_OTHER): Payer: PRIVATE HEALTH INSURANCE

## 2021-04-18 VITALS — BP 119/77 | HR 98 | Temp 98.4°F | Resp 18 | Ht 65.0 in | Wt 138.0 lb

## 2021-04-18 DIAGNOSIS — D500 Iron deficiency anemia secondary to blood loss (chronic): Secondary | ICD-10-CM

## 2021-04-18 MED ORDER — SODIUM CHLORIDE 0.9 % IV SOLN: Freq: Once | INTRAVENOUS | Status: DC | PRN

## 2021-04-18 MED ORDER — METHYLPREDNISOLONE SODIUM SUCC 125 MG IJ SOLR: 125.0000 mg | Freq: Once | INTRAMUSCULAR | Status: DC | PRN

## 2021-04-18 MED ORDER — ALBUTEROL SULFATE HFA 108 (90 BASE) MCG/ACT IN AERS: 2.0000 | INHALATION_SPRAY | Freq: Once | RESPIRATORY_TRACT | Status: DC | PRN

## 2021-04-18 MED ORDER — SODIUM CHLORIDE 0.9 % IV SOLN
200.0000 mg | Freq: Once | INTRAVENOUS | Status: AC
  Administered 2021-04-18: 200 mg via INTRAVENOUS
  Filled 2021-04-18: qty 10

## 2021-04-18 MED ORDER — EPINEPHRINE 0.3 MG/0.3ML IJ SOAJ: 0.3000 mg | Freq: Once | INTRAMUSCULAR | Status: DC | PRN

## 2021-04-18 MED ORDER — DIPHENHYDRAMINE HCL 50 MG/ML IJ SOLN: 50.0000 mg | Freq: Once | INTRAMUSCULAR | Status: DC | PRN

## 2021-04-18 MED ORDER — FAMOTIDINE IN NACL 20-0.9 MG/50ML-% IV SOLN: 20.0000 mg | Freq: Once | INTRAVENOUS | Status: DC | PRN

## 2021-04-18 NOTE — Progress Notes (Signed)
Diagnosis: Iron Deficiency Anemia  Provider:  Marshell Garfinkel, MD  Procedure: Infusion  IV Type: Peripheral, IV Location: R Antecubital  Venofer (Iron Sucrose), Dose: 200 mg  Infusion Start Time: 5053  Infusion Stop Time: 1410  Post Infusion IV Care: Peripheral IV Discontinued  Discharge: Condition: Good, Destination: Home . AVS provided to patient.   Performed by:  Cleophus Molt, RN

## 2021-04-24 ENCOUNTER — Other Ambulatory Visit: Payer: Self-pay

## 2021-04-24 ENCOUNTER — Ambulatory Visit (INDEPENDENT_AMBULATORY_CARE_PROVIDER_SITE_OTHER): Payer: PRIVATE HEALTH INSURANCE

## 2021-04-24 VITALS — BP 109/67 | HR 70 | Temp 98.4°F | Resp 18 | Ht 65.0 in | Wt 138.0 lb

## 2021-04-24 DIAGNOSIS — D500 Iron deficiency anemia secondary to blood loss (chronic): Secondary | ICD-10-CM | POA: Diagnosis not present

## 2021-04-24 MED ORDER — SODIUM CHLORIDE 0.9 % IV SOLN: Freq: Once | INTRAVENOUS | Status: DC | PRN

## 2021-04-24 MED ORDER — METHYLPREDNISOLONE SODIUM SUCC 125 MG IJ SOLR: 125.0000 mg | Freq: Once | INTRAMUSCULAR | Status: DC | PRN

## 2021-04-24 MED ORDER — DIPHENHYDRAMINE HCL 50 MG/ML IJ SOLN: 50.0000 mg | Freq: Once | INTRAMUSCULAR | Status: DC | PRN

## 2021-04-24 MED ORDER — ALBUTEROL SULFATE HFA 108 (90 BASE) MCG/ACT IN AERS: 2.0000 | INHALATION_SPRAY | Freq: Once | RESPIRATORY_TRACT | Status: DC | PRN

## 2021-04-24 MED ORDER — EPINEPHRINE 0.3 MG/0.3ML IJ SOAJ: 0.3000 mg | Freq: Once | INTRAMUSCULAR | Status: DC | PRN

## 2021-04-24 MED ORDER — FAMOTIDINE IN NACL 20-0.9 MG/50ML-% IV SOLN: 20.0000 mg | Freq: Once | INTRAVENOUS | Status: DC | PRN

## 2021-04-24 MED ORDER — SODIUM CHLORIDE 0.9 % IV SOLN
200.0000 mg | Freq: Once | INTRAVENOUS | Status: AC
  Administered 2021-04-24: 200 mg via INTRAVENOUS
  Filled 2021-04-24: qty 10

## 2021-04-24 NOTE — Progress Notes (Signed)
Diagnosis: Iron Deficiency Anemia  Provider:  Marshell Garfinkel, MD  Procedure: Infusion  IV Type: Peripheral, IV Location: R Forearm  Venofer (Iron Sucrose), Dose: 200 mg  Infusion Start Time: 1914  Infusion Stop Time: 7829  Post Infusion IV Care: Peripheral IV Discontinued  Discharge: Condition: Good, Destination: Home . AVS provided to patient.   Performed by:  Cleophus Molt, RN

## 2021-04-24 NOTE — Patient Instructions (Signed)

## 2021-04-26 ENCOUNTER — Ambulatory Visit: Payer: Medicare Other | Admitting: Hematology and Oncology

## 2021-04-26 ENCOUNTER — Other Ambulatory Visit: Payer: Medicare Other

## 2021-05-01 ENCOUNTER — Other Ambulatory Visit: Payer: Self-pay

## 2021-05-01 ENCOUNTER — Ambulatory Visit (INDEPENDENT_AMBULATORY_CARE_PROVIDER_SITE_OTHER): Payer: PRIVATE HEALTH INSURANCE

## 2021-05-01 VITALS — BP 126/60 | HR 95 | Temp 97.5°F | Resp 18 | Ht 65.0 in | Wt 137.0 lb

## 2021-05-01 DIAGNOSIS — D500 Iron deficiency anemia secondary to blood loss (chronic): Secondary | ICD-10-CM

## 2021-05-01 MED ORDER — FAMOTIDINE IN NACL 20-0.9 MG/50ML-% IV SOLN: 20.0000 mg | Freq: Once | INTRAVENOUS | Status: DC | PRN

## 2021-05-01 MED ORDER — EPINEPHRINE 0.3 MG/0.3ML IJ SOAJ: 0.3000 mg | Freq: Once | INTRAMUSCULAR | Status: DC | PRN

## 2021-05-01 MED ORDER — ALBUTEROL SULFATE HFA 108 (90 BASE) MCG/ACT IN AERS: 2.0000 | INHALATION_SPRAY | Freq: Once | RESPIRATORY_TRACT | Status: DC | PRN

## 2021-05-01 MED ORDER — SODIUM CHLORIDE 0.9 % IV SOLN
200.0000 mg | Freq: Once | INTRAVENOUS | Status: AC
  Administered 2021-05-01: 200 mg via INTRAVENOUS
  Filled 2021-05-01: qty 10

## 2021-05-01 MED ORDER — METHYLPREDNISOLONE SODIUM SUCC 125 MG IJ SOLR: 125.0000 mg | Freq: Once | INTRAMUSCULAR | Status: DC | PRN

## 2021-05-01 MED ORDER — DIPHENHYDRAMINE HCL 50 MG/ML IJ SOLN: 50.0000 mg | Freq: Once | INTRAMUSCULAR | Status: DC | PRN

## 2021-05-01 MED ORDER — SODIUM CHLORIDE 0.9 % IV SOLN: Freq: Once | INTRAVENOUS | Status: DC | PRN

## 2021-05-01 NOTE — Progress Notes (Signed)
Diagnosis: Iron Deficiency Anemia  Provider:  Marshell Garfinkel, MD  Procedure: Infusion  IV Type: Peripheral, IV Location: R Antecubital  Venofer (Iron Sucrose), Dose: 200 mg  Infusion Start Time: 15.01  Infusion Stop Time: 15.20  Post Infusion IV Care: Peripheral IV Discontinued  Discharge: Condition: Good, Destination: Home . AVS provided to patient.   Performed by:  Arnoldo Morale, RN

## 2021-05-08 ENCOUNTER — Ambulatory Visit (INDEPENDENT_AMBULATORY_CARE_PROVIDER_SITE_OTHER): Payer: PRIVATE HEALTH INSURANCE

## 2021-05-08 ENCOUNTER — Other Ambulatory Visit: Payer: Self-pay

## 2021-05-08 VITALS — BP 113/71 | HR 89 | Temp 97.9°F | Resp 18 | Ht 65.0 in | Wt 137.0 lb

## 2021-05-08 DIAGNOSIS — D500 Iron deficiency anemia secondary to blood loss (chronic): Secondary | ICD-10-CM

## 2021-05-08 MED ORDER — METHYLPREDNISOLONE SODIUM SUCC 125 MG IJ SOLR: 125.0000 mg | Freq: Once | INTRAMUSCULAR | Status: DC | PRN

## 2021-05-08 MED ORDER — SODIUM CHLORIDE 0.9 % IV SOLN: Freq: Once | INTRAVENOUS | Status: DC | PRN

## 2021-05-08 MED ORDER — FAMOTIDINE IN NACL 20-0.9 MG/50ML-% IV SOLN: 20.0000 mg | Freq: Once | INTRAVENOUS | Status: DC | PRN

## 2021-05-08 MED ORDER — DIPHENHYDRAMINE HCL 50 MG/ML IJ SOLN: 50.0000 mg | Freq: Once | INTRAMUSCULAR | Status: DC | PRN

## 2021-05-08 MED ORDER — EPINEPHRINE 0.3 MG/0.3ML IJ SOAJ: 0.3000 mg | Freq: Once | INTRAMUSCULAR | Status: DC | PRN

## 2021-05-08 MED ORDER — SODIUM CHLORIDE 0.9 % IV SOLN
200.0000 mg | Freq: Once | INTRAVENOUS | Status: AC
  Administered 2021-05-08: 200 mg via INTRAVENOUS
  Filled 2021-05-08: qty 10

## 2021-05-08 MED ORDER — ALBUTEROL SULFATE HFA 108 (90 BASE) MCG/ACT IN AERS: 2.0000 | INHALATION_SPRAY | Freq: Once | RESPIRATORY_TRACT | Status: DC | PRN

## 2021-05-08 NOTE — Progress Notes (Signed)
Diagnosis: Iron Deficiency Anemia  Provider:  Marshell Garfinkel, MD  Procedure: Infusion  IV Type: Peripheral, IV Location: R Antecubital  Venofer (Iron Sucrose), Dose: 200 mg  Infusion Start Time: 14.10 05/08/2020  Infusion Stop Time: 14.28 05/08/2020  Post Infusion IV Care: Peripheral IV Discontinued  Discharge: Condition: Good, Destination: Home . AVS provided to patient.   Performed by:  Arnoldo Morale, RN

## 2021-06-14 ENCOUNTER — Other Ambulatory Visit: Payer: Self-pay

## 2021-06-14 ENCOUNTER — Emergency Department (HOSPITAL_COMMUNITY): Payer: Medicare PPO

## 2021-06-14 ENCOUNTER — Inpatient Hospital Stay: Payer: Medicare PPO | Attending: Hematology and Oncology

## 2021-06-14 ENCOUNTER — Emergency Department (HOSPITAL_COMMUNITY)
Admission: EM | Admit: 2021-06-14 | Discharge: 2021-06-14 | Disposition: A | Discharge status: Home / Self Care | Payer: Medicare PPO | Attending: Emergency Medicine | Admitting: Emergency Medicine

## 2021-06-14 ENCOUNTER — Inpatient Hospital Stay: Payer: Medicare PPO | Admitting: Hematology and Oncology

## 2021-06-14 ENCOUNTER — Encounter: Payer: Self-pay | Admitting: Physician Assistant

## 2021-06-14 DIAGNOSIS — R531 Weakness: Secondary | ICD-10-CM | POA: Diagnosis not present

## 2021-06-14 DIAGNOSIS — Z7984 Long term (current) use of oral hypoglycemic drugs: Secondary | ICD-10-CM | POA: Insufficient documentation

## 2021-06-14 DIAGNOSIS — J45909 Unspecified asthma, uncomplicated: Secondary | ICD-10-CM | POA: Insufficient documentation

## 2021-06-14 DIAGNOSIS — R079 Chest pain, unspecified: Secondary | ICD-10-CM | POA: Diagnosis present

## 2021-06-14 DIAGNOSIS — R059 Cough, unspecified: Secondary | ICD-10-CM | POA: Insufficient documentation

## 2021-06-14 DIAGNOSIS — R0602 Shortness of breath: Secondary | ICD-10-CM | POA: Diagnosis not present

## 2021-06-14 DIAGNOSIS — E119 Type 2 diabetes mellitus without complications: Secondary | ICD-10-CM | POA: Diagnosis not present

## 2021-06-14 DIAGNOSIS — R0789 Other chest pain: Secondary | ICD-10-CM | POA: Insufficient documentation

## 2021-06-14 DIAGNOSIS — R5383 Other fatigue: Secondary | ICD-10-CM | POA: Insufficient documentation

## 2021-06-14 LAB — CBC WITH DIFFERENTIAL/PLATELET
Abs Immature Granulocytes: 0 10*3/uL (ref 0.00–0.07)
Basophils Absolute: 0.1 10*3/uL (ref 0.0–0.1)
Basophils Relative: 1 %
Eosinophils Absolute: 0.3 10*3/uL (ref 0.0–0.5)
Eosinophils Relative: 4 %
HCT: 36 % (ref 36.0–46.0)
Hemoglobin: 10.7 g/dL — ABNORMAL LOW (ref 12.0–15.0)
Lymphocytes Relative: 22 %
Lymphs Abs: 1.7 10*3/uL (ref 0.7–4.0)
MCH: 26 pg (ref 26.0–34.0)
MCHC: 29.7 g/dL — ABNORMAL LOW (ref 30.0–36.0)
MCV: 87.6 fL (ref 80.0–100.0)
Monocytes Absolute: 0.1 10*3/uL (ref 0.1–1.0)
Monocytes Relative: 1 %
Neutro Abs: 5.7 10*3/uL (ref 1.7–7.7)
Neutrophils Relative %: 72 %
Platelets: 243 10*3/uL (ref 150–400)
RBC: 4.11 MIL/uL (ref 3.87–5.11)
RDW: 22 % — ABNORMAL HIGH (ref 11.5–15.5)
WBC: 7.9 10*3/uL (ref 4.0–10.5)
nRBC: 0 % (ref 0.0–0.2)
nRBC: 0 /100 WBC

## 2021-06-14 LAB — TROPONIN I (HIGH SENSITIVITY)
Troponin I (High Sensitivity): 5 ng/L (ref <18)
Troponin I (High Sensitivity): 6 ng/L (ref <18)

## 2021-06-14 LAB — COMPREHENSIVE METABOLIC PANEL
ALT: 20 U/L (ref 0–44)
AST: 17 U/L (ref 15–41)
Albumin: 2.8 g/dL — ABNORMAL LOW (ref 3.5–5.0)
Alkaline Phosphatase: 73 U/L (ref 38–126)
Anion gap: 7 (ref 5–15)
BUN: 28 mg/dL — ABNORMAL HIGH (ref 8–23)
CO2: 24 mmol/L (ref 22–32)
Calcium: 9.2 mg/dL (ref 8.9–10.3)
Chloride: 108 mmol/L (ref 98–111)
Creatinine, Ser: 0.95 mg/dL (ref 0.44–1.00)
GFR, Estimated: >60 mL/min (ref >60)
Glucose, Bld: 130 mg/dL — ABNORMAL HIGH (ref 70–99)
Potassium: 5.7 mmol/L — ABNORMAL HIGH (ref 3.5–5.1)
Sodium: 139 mmol/L (ref 135–145)
Total Bilirubin: 0.4 mg/dL (ref 0.3–1.2)
Total Protein: 5.5 g/dL — ABNORMAL LOW (ref 6.5–8.1)

## 2021-06-14 LAB — POTASSIUM: Potassium: 5.4 mmol/L — ABNORMAL HIGH (ref 3.5–5.1)

## 2021-06-14 MED ORDER — IOHEXOL 350 MG/ML SOLN
100.0000 mL | Freq: Once | INTRAVENOUS | Status: AC | PRN
  Administered 2021-06-14: 100 mL via INTRAVENOUS

## 2021-06-14 NOTE — ED Triage Notes (Signed)
Pt BIB EMS from [facility], c/o mid sharp chest pain started last night, worse with deep breathing. On ABX for UTI. H/O same related to stress, DVT no longer on thinners, GERD on meds. Pt refused ASA. ? ?140/70 ?HR 76 ?SPO2 92% RA ?CBG 145 ?

## 2021-06-14 NOTE — ED Provider Notes (Signed)
?[facility] ?Provider Note ? ? ?CSN: [id number] ?Arrival date & time: 06/14/21  6962 ? ?  ? ?History ? ?Chief Complaint  ?Patient presents with  ? Chest Pain  ? ? ?[name] is a 81 y.o. female. ? ?Patient is a 81 year old female with a history of diabetes, RA, PE/DVT, recurrent GI bleeding and anemia resulting in discontinuation of her oral anticoagulant approximately 8 to 9 months ago, ongoing iron transfusions and history of asthma that she uses nebulizers for as needed who is presenting today with complaint of chest pain.  Patient reports yesterday evening is when the pain started she describes it as a sharp pain in the center of her chest which is worse with taking a deep breath.  Last night she reported the pain was a 8 out of 10 but she was able to go to sleep.  When she woke up this morning she let the nursing staff know that she was still having pain which she now reports is about a 4 out of 10.  She has noted over the last 2 days she has had a little bit more of a cough with some minimal yellow sputum but denies any shortness of breath, wheezing, fever.  She has had no abdominal discomfort, nausea or vomiting.  When EMS arrived today they reported normal vital signs.  Patient is no longer taking any anticoagulation including no aspirin.  She denies any recent leg pain or swelling but reports she has been on an antibiotic for a UTI. ? ?The history is provided by the patient.  ?Chest Pain ? ?  ? ?Home Medications ?Prior to Admission medications   ?Medication Sig Start Date End Date Taking? Authorizing Provider  ?acetaminophen (TYLENOL) 500 MG tablet Take 1,000 mg by mouth every 6 (six) hours as needed for mild pain (or headaches).    [provider]  ?arformoterol (BROVANA) 15 MCG/2ML NEBU Take 15 mcg by nebulization 2 (two) times daily as needed (for shortness of breath).    [provider]  ?atorvastatin (LIPITOR) 20 MG tablet Take 20 mg by mouth  daily. 10/23/20   [provider]  ?azelastine (OPTIVAR) 0.05 % ophthalmic solution Place 1 drop into both eyes daily. 05/24/21   [provider]  ?B-D UF III MINI PEN NEEDLES 31G X 5 MM MISC SMARTSIG:1 Each SUB-Q 4 Times Daily 10/22/19   [provider]  ?budesonide (PULMICORT) 0.25 MG/2ML nebulizer solution Take 2 mLs (0.25 mg total) by nebulization 2 (two) times daily. 05/26/20 10/27/21  [name], MD  ?ciprofloxacin (CIPRO) 500 MG tablet Take 500 mg by mouth 2 (two) times daily. 06/08/21   [provider]  ?Cyanocobalamin (B-12) 2500 MCG TABS Take 2,500 mcg by mouth at bedtime.     [provider]  ?DULoxetine (CYMBALTA) 60 MG capsule Take 60 mg by mouth at bedtime.     [provider]  ?ezetimibe (ZETIA) 10 MG tablet Take 10 mg by mouth at bedtime.  11/04/16   [provider]  ?ferrous gluconate (FERGON) 324 MG tablet Take 324 mg by mouth every other day.    [provider]  ?HYDROcodone-acetaminophen (NORCO/VICODIN) 5-325 MG tablet Take 1 tablet by mouth 4 (four) times daily as needed for pain. 06/08/21   [provider]  ?ipratropium-albuterol (DUONEB) 0.5-2.5 (3) MG/3ML SOLN Take 3 mLs by nebulization every 6 (six) hours as needed (wheezing). 07/19/20   [provider]  ?ketorolac (ACULAR) 0.5 % ophthalmic solution Place  1 drop into both eyes 4 (four) times daily. 11/16/20   Lavina Hamman, MD  ?loperamide (IMODIUM) 2 MG capsule Take by mouth as needed for diarrhea or loose stools.    [provider]  ?metFORMIN (GLUCOPHAGE) 500 MG tablet Take 500 mg by mouth 2 (two) times daily. 10/25/20   [provider]  ?metoprolol tartrate (LOPRESSOR) 25 MG tablet Take 25 mg by mouth daily. 06/12/21   [provider]  ?NOVOLIN 70/30 KWIKPEN (70-30) 100 UNIT/ML KwikPen Inject 10 Units into the skin 2 (two) times daily. 10/20/20   [provider]  ?ondansetron (ZOFRAN-ODT) 8 MG disintegrating tablet Take 8 mg  by mouth every 8 (eight) hours as needed for nausea or vomiting (dissolve orally). 10/24/20   [provider]  ?oxyCODONE (OXYCONTIN) 10 mg 12 hr tablet Take 10 mg by mouth daily.    [provider]  ?pantoprazole (PROTONIX) 40 MG tablet Take one tablet once every morning ?Patient taking differently: Take 40 mg by mouth daily before breakfast. 05/26/20   [name], MD  ?phenazopyridine (PYRIDIUM) 100 MG tablet Take 100 mg by mouth daily as needed. 06/04/21   [provider]  ?predniSONE (DELTASONE) 5 MG tablet Take 5 mg by mouth at bedtime.    [provider]  ?psyllium (METAMUCIL) 58.6 % packet Take 1 packet by mouth daily. 11/16/20   Lavina Hamman, MD  ?tolterodine (DETROL LA) 2 MG 24 hr capsule Take 2 mg by mouth every evening. 08/15/20   [provider]  ?MYRBETRIQ 50 MG TB24 tablet Take 50 mg by mouth at bedtime. ?Patient not taking: Reported on 08/19/2020 03/29/20 08/19/20  [provider]  ?   ? ?Allergies    ?Penicillins, Sulfamethoxazole-trimethoprim, Adalimumab, Hydroxychloroquine sulfate, Meperidine, Meperidine hcl, Methotrexate, Penicillin g sodium, Phentermine-topiramate, Sulfa antibiotics, and Sulfonamide derivatives   ? ?Review of Systems   ?Review of Systems  ?Cardiovascular:  Positive for chest pain.  ? ?Physical Exam ?Updated Vital Signs ?BP 131/63   Pulse 73   Temp 98 ?F (36.7 ?C) (Oral)   Resp 18   Ht '5\' 5"'$  (1.651 m)   Wt 66.7 kg   SpO2 99%   BMI 24.46 kg/m?  ?Physical Exam ?Vitals and nursing note reviewed.  ?Constitutional:   ?   General: She is not in acute distress. ?   Appearance: She is well-developed.  ?   Comments: Chronically ill-appearing  ?HENT:  ?   Head: Normocephalic and atraumatic.  ?   Nose: Nose normal.  ?   Mouth/Throat:  ?   Mouth: Mucous membranes are dry.  ?Eyes:  ?   Conjunctiva/sclera: Conjunctivae normal.  ?   Pupils: Pupils are equal, round, and reactive to light.  ?Cardiovascular:  ?   Rate and Rhythm: Normal rate and  regular rhythm.  ?   Pulses: Normal pulses.  ?   Heart sounds: No murmur heard. ?Pulmonary:  ?   Effort: Pulmonary effort is normal. No respiratory distress.  ?   Breath sounds: Examination of the right-lower field reveals rales. Examination of the left-lower field reveals rales. Rales present. No wheezing.  ?Chest:  ?   Chest wall: No tenderness.  ?Abdominal:  ?   General: There is no distension.  ?   Palpations: Abdomen is soft.  ?   Tenderness: There is no abdominal tenderness. There is no guarding or rebound.  ?Musculoskeletal:     ?   General: No tenderness. Normal range of motion.  ?  Cervical back: Normal range of motion and neck supple.  ?   Comments: Deformities of the bilateral hands consistent with rheumatoid arthritis  ?Skin: ?   General: Skin is warm and dry.  ?   Findings: No erythema or rash.  ?Neurological:  ?   Mental Status: She is alert and oriented to person, place, and time. Mental status is at baseline.  ?Psychiatric:     ?   Mood and Affect: Mood normal.     ?   Behavior: Behavior normal.  ? ? ?ED Results / Procedures / Treatments   ?Labs ?(all labs ordered are listed, but only abnormal results are displayed) ?Labs Reviewed  ?CBC WITH DIFFERENTIAL/PLATELET - Abnormal; Notable for the following components:  ?    Result Value  ? Hemoglobin 10.7 (*)   ? MCHC 29.7 (*)   ? RDW 22.0 (*)   ? All other components within normal limits  ?COMPREHENSIVE METABOLIC PANEL - Abnormal; Notable for the following components:  ? Potassium 5.7 (*)   ? Glucose, Bld 130 (*)   ? BUN 28 (*)   ? Total Protein 5.5 (*)   ? Albumin 2.8 (*)   ? All other components within normal limits  ?POTASSIUM - Abnormal; Notable for the following components:  ? Potassium 5.4 (*)   ? All other components within normal limits  ?TROPONIN I (HIGH SENSITIVITY)  ?TROPONIN I (HIGH SENSITIVITY)  ? ? ?EKG ?None ? ?Radiology ?CT Angio Chest PE W and/or Wo Contrast ? ?Result Date: 06/14/2021 ?CLINICAL DATA:  Chest pain since last night. History  of pulmonary embolism. EXAM: CT ANGIOGRAPHY CHEST WITH CONTRAST TECHNIQUE: Multidetector CT imaging of the chest was performed using the standard protocol during bolus administration of intravenous contrast. [place]

## 2021-06-14 NOTE — Discharge Instructions (Signed)
All the blood work today looks good.  No signs of heart attack or blood clots.  No signs of pneumonia.  It may be that you are getting a mild cold or allergies or irritating your lung lining.  It is okay to use your inhaler as needed and take your pain medication as needed.  This should get better on its own.  However if you start having a fever, shortness of breath, vomiting or other concerns return to the emergency room. ?

## 2021-06-14 NOTE — ED Notes (Signed)
Diaper changed, assisted pt in putting clothes on for discharge. ?

## 2021-06-27 ENCOUNTER — Telehealth: Payer: Self-pay | Admitting: Hematology and Oncology

## 2021-06-27 NOTE — Telephone Encounter (Signed)
Per 4/19 I basket called pt and left message about rescheduled appointment.  Call back number left if changes are needed ?

## 2021-07-10 ENCOUNTER — Inpatient Hospital Stay: Payer: Medicare PPO | Attending: Hematology and Oncology

## 2021-07-10 ENCOUNTER — Inpatient Hospital Stay: Payer: Medicare PPO

## 2021-07-10 ENCOUNTER — Inpatient Hospital Stay (HOSPITAL_BASED_OUTPATIENT_CLINIC_OR_DEPARTMENT_OTHER): Payer: Medicare PPO | Admitting: Hematology and Oncology

## 2021-07-10 ENCOUNTER — Encounter: Payer: Self-pay | Admitting: Hematology and Oncology

## 2021-07-10 ENCOUNTER — Other Ambulatory Visit: Payer: Self-pay

## 2021-07-10 VITALS — BP 134/49 | HR 70 | Temp 99.0°F | Resp 18 | Ht 65.0 in | Wt 143.9 lb

## 2021-07-10 DIAGNOSIS — E119 Type 2 diabetes mellitus without complications: Secondary | ICD-10-CM | POA: Insufficient documentation

## 2021-07-10 DIAGNOSIS — D500 Iron deficiency anemia secondary to blood loss (chronic): Secondary | ICD-10-CM

## 2021-07-10 DIAGNOSIS — M810 Age-related osteoporosis without current pathological fracture: Secondary | ICD-10-CM | POA: Insufficient documentation

## 2021-07-10 DIAGNOSIS — Z87891 Personal history of nicotine dependence: Secondary | ICD-10-CM | POA: Diagnosis not present

## 2021-07-10 DIAGNOSIS — Z803 Family history of malignant neoplasm of breast: Secondary | ICD-10-CM | POA: Insufficient documentation

## 2021-07-10 DIAGNOSIS — F329 Major depressive disorder, single episode, unspecified: Secondary | ICD-10-CM | POA: Diagnosis not present

## 2021-07-10 DIAGNOSIS — M797 Fibromyalgia: Secondary | ICD-10-CM | POA: Insufficient documentation

## 2021-07-10 DIAGNOSIS — J45909 Unspecified asthma, uncomplicated: Secondary | ICD-10-CM | POA: Insufficient documentation

## 2021-07-10 DIAGNOSIS — J439 Emphysema, unspecified: Secondary | ICD-10-CM | POA: Diagnosis not present

## 2021-07-10 DIAGNOSIS — M069 Rheumatoid arthritis, unspecified: Secondary | ICD-10-CM | POA: Insufficient documentation

## 2021-07-10 DIAGNOSIS — M2550 Pain in unspecified joint: Secondary | ICD-10-CM | POA: Diagnosis not present

## 2021-07-10 DIAGNOSIS — G4730 Sleep apnea, unspecified: Secondary | ICD-10-CM | POA: Insufficient documentation

## 2021-07-10 DIAGNOSIS — Z86711 Personal history of pulmonary embolism: Secondary | ICD-10-CM | POA: Diagnosis not present

## 2021-07-10 DIAGNOSIS — Z809 Family history of malignant neoplasm, unspecified: Secondary | ICD-10-CM | POA: Diagnosis not present

## 2021-07-10 DIAGNOSIS — R443 Hallucinations, unspecified: Secondary | ICD-10-CM | POA: Insufficient documentation

## 2021-07-10 DIAGNOSIS — D509 Iron deficiency anemia, unspecified: Secondary | ICD-10-CM | POA: Insufficient documentation

## 2021-07-10 DIAGNOSIS — D638 Anemia in other chronic diseases classified elsewhere: Secondary | ICD-10-CM

## 2021-07-10 LAB — COMPREHENSIVE METABOLIC PANEL
ALT: 15 U/L (ref 0–44)
AST: 14 U/L — ABNORMAL LOW (ref 15–41)
Albumin: 3.4 g/dL — ABNORMAL LOW (ref 3.5–5.0)
Alkaline Phosphatase: 65 U/L (ref 38–126)
Anion gap: 10 (ref 5–15)
BUN: 19 mg/dL (ref 8–23)
CO2: 24 mmol/L (ref 22–32)
Calcium: 9 mg/dL (ref 8.9–10.3)
Chloride: 107 mmol/L (ref 98–111)
Creatinine, Ser: 0.75 mg/dL (ref 0.44–1.00)
GFR, Estimated: >60 mL/min (ref >60)
Glucose, Bld: 88 mg/dL (ref 70–99)
Potassium: 4.3 mmol/L (ref 3.5–5.1)
Sodium: 141 mmol/L (ref 135–145)
Total Bilirubin: 0.3 mg/dL (ref 0.3–1.2)
Total Protein: 6 g/dL — ABNORMAL LOW (ref 6.5–8.1)

## 2021-07-10 LAB — CBC WITH DIFFERENTIAL/PLATELET
Abs Immature Granulocytes: 0.07 10*3/uL (ref 0.00–0.07)
Basophils Absolute: 0.1 10*3/uL (ref 0.0–0.1)
Basophils Relative: 1 %
Eosinophils Absolute: 0.3 10*3/uL (ref 0.0–0.5)
Eosinophils Relative: 3 %
HCT: 36.2 % (ref 36.0–46.0)
Hemoglobin: 11.3 g/dL — ABNORMAL LOW (ref 12.0–15.0)
Immature Granulocytes: 1 %
Lymphocytes Relative: 21 %
Lymphs Abs: 2.2 10*3/uL (ref 0.7–4.0)
MCH: 26.9 pg (ref 26.0–34.0)
MCHC: 31.2 g/dL (ref 30.0–36.0)
MCV: 86.2 fL (ref 80.0–100.0)
Monocytes Absolute: 0.7 10*3/uL (ref 0.1–1.0)
Monocytes Relative: 6 %
Neutro Abs: 7.2 10*3/uL (ref 1.7–7.7)
Neutrophils Relative %: 68 %
Platelets: 237 10*3/uL (ref 150–400)
RBC: 4.2 MIL/uL (ref 3.87–5.11)
RDW: 18.6 % — ABNORMAL HIGH (ref 11.5–15.5)
WBC: 10.4 10*3/uL (ref 4.0–10.5)
nRBC: 0 % (ref 0.0–0.2)

## 2021-07-10 LAB — IRON AND IRON BINDING CAPACITY (CC-WL,HP ONLY)
Iron: 60 ug/dL (ref 28–170)
Saturation Ratios: 24 % (ref 10.4–31.8)
TIBC: 253 ug/dL (ref 250–450)
UIBC: 193 ug/dL (ref 148–442)

## 2021-07-10 NOTE — Progress Notes (Signed)
?[name] ?Telephone:(336) [phone]   [phone] ? ?PROGRESS NOTE ? ?Patient Care Team: ?[name], MD as PCP - General (Internal Medicine) ?Hallows, Rhett K, MD as Consulting Physician (Orthopedic Surgery) ? ? ?CHIEF COMPLAINTS/PURPOSE OF CONSULTATION:  ?Microcytic anemia ? ?HISTORY OF PRESENTING ILLNESS:  ?[name] 81 y.o. female returns for a follow up for microcytic anemia. She was last seen by Dr. Chryl Heck on 11/30/2021. In the interim, she denies any changes to her health.  ?She has not been noticing any severe bleeding except when she wipes her bottom, she notices a tinge of blood.  She also has a prolapsed rectum which has been giving her a lot of issues.  She has severe arthritis and is not very mobile.  She lives in a nursing facility.  She says she is depressed because she does not want to stay in the nursing facility and she feels like she is being dumped in the nursing facility so her family does not have to take care of her.  She also reports some hallucinations which she wonders if it is related to her pain medication or her antidepressant.  She is also taking her oral iron twice a day.  Rest of the pertinent 10 point ROS reviewed and negative ? ?MEDICAL HISTORY:  ?Past Medical History:  ?Diagnosis Date  ? Acute pulmonary embolism (HCC)   ? b/l; dx'ed May '22  ? Asthma   ? Complication of anesthesia   ? Anesthesia told her years ago she got too cold during surgery  ? Diabetes mellitus without complication ([place])   ? Emphysema   ? Fibromyalgia   ? Osteoporosis   ? Rheumatoid arthritis(714.0)   ? Sleep apnea   ? no cpap  ? ? ?SURGICAL HISTORY: ?Past Surgical History:  ?Procedure Laterality Date  ? ABDOMINAL HYSTERECTOMY    ? ANKLE FRACTURE SURGERY  2007  ? BREAST SURGERY    ? mammoplasty and then removed  ? COLONOSCOPY WITH PROPOFOL N/A 08/22/2020  ? Procedure: COLONOSCOPY WITH PROPOFOL;  Surgeon: Ronnette Juniper, MD;  Location: [facility];  Service: Gastroenterology;   Laterality: N/A;  If schedule availability permits, would like to add on upper endoscopy as well  ? ENTEROSCOPY N/A 11/11/2020  ? Procedure: ENTEROSCOPY;  Surgeon: Arta Silence, MD;  Location: [facility] ENDOSCOPY;  Service: Endoscopy;  Laterality: N/A;  ? ESOPHAGOGASTRODUODENOSCOPY (EGD) WITH PROPOFOL N/A 08/22/2020  ? Procedure: ESOPHAGOGASTRODUODENOSCOPY (EGD) WITH PROPOFOL;  Surgeon: Ronnette Juniper, MD;  [facility]: [facility];  Service: Gastroenterology;  Laterality: N/A;  ? FOOT SURGERY    ? numerous  ? GIVENS CAPSULE STUDY N/A 10/28/2020  ? Procedure: GIVENS CAPSULE STUDY;  Surgeon: Otis Brace, MD;  [facility]: [facility];  Service: Gastroenterology;  Laterality: N/A;  ? HOT HEMOSTASIS N/A 08/22/2020  ? Procedure: HOT HEMOSTASIS (ARGON PLASMA COAGULATION/BICAP);  Surgeon: Ronnette Juniper, MD;  Location: [facility];  Service: Gastroenterology;  Laterality: N/A;  ? I & D EXTREMITY Right 10/26/2014  ? Procedure: IRRIGATION AND DEBRIDEMENT RIGHT KNEE ;  Surgeon: Gaynelle Arabian, MD;  Location: [facility];  Service: Orthopedics;  Laterality: Right;  ? IRRIGATION AND DEBRIDEMENT KNEE Right 03/09/2017  ? Procedure: IRRIGATION AND DEBRIDEMENT RIGHT KNEE PRE-PATELLAR BURSA;  Surgeon: Susa Day, MD;  Location: [facility];  Service: Orthopedics;  Laterality: Right;  ? KNEE BURSECTOMY Right 09/16/2014  ? Procedure: EXCISON OF PRE PATELLA BURSA RIGHT KNEE ;  Surgeon: Gaynelle Arabian, MD;  [facility]: [facility];  Service: Orthopedics;  Laterality: Right;  ? PARTIAL HYSTERECTOMY  1970  ? POLYPECTOMY  08/22/2020  ? Procedure: POLYPECTOMY;  Surgeon: Ronnette Juniper, MD;  Location: [facility];  Service: Gastroenterology;;  ? TOTAL KNEE ARTHROPLASTY  2005 / 2006  ? x2  ? ? ?SOCIAL HISTORY: ?Social History  ? ?Socioeconomic History  ? Marital status: Divorced  ?  Spouse name: Not on file  ? Number of children: Not on file  ? Years of education: Not on file  ? Highest education level: Not on file  ?Occupational History  ? Not on file  ?Tobacco Use  ?  Smoking status: Former  ?  Types: Cigarettes  ?  Quit date: 03/11/1994  ?  Years since quitting: 27.3  ? Smokeless tobacco: Never  ?Vaping Use  ? Vaping Use: Never used  ?Substance and Sexual Activity  ? Alcohol use: No  ? Drug use: No  ? Sexual activity: Not on file  ?Other Topics Concern  ? Not on file  ?Social History Narrative  ? Not on file  ? ?Social Determinants of Health  ? ?Financial Resource Strain: Not on file  ?Food Insecurity: No Food Insecurity  ? Worried About Charity fundraiser in the Last Year: Never true  ? Ran Out of Food in the Last Year: Never true  ?Transportation Needs: Unmet Transportation Needs  ? Lack of Transportation (Medical): Yes  ? Lack of Transportation (Non-Medical): Yes  ?Physical Activity: Not on file  ?Stress: Not on file  ?Social Connections: Not on file  ?Intimate Partner Violence: Not on file  ? ? ?FAMILY HISTORY: ?Family History  ?Problem Relation Age of Onset  ? Hypertension Other   ? Diabetes Other   ? Stroke Other   ? Diabetes Mother   ? Diabetes Father   ? Diabetes Sister   ? Diabetes Maternal Aunt   ? Cancer Paternal Uncle   ? Cancer Paternal Grandmother   ? Breast cancer Paternal Grandmother   ? ? ?ALLERGIES:  is allergic to penicillins, sulfamethoxazole-trimethoprim, adalimumab, hydroxychloroquine sulfate, meperidine, meperidine hcl, methotrexate, penicillin g sodium, phentermine-topiramate, sulfa antibiotics, and sulfonamide derivatives. ? ?MEDICATIONS:  ?Current Outpatient Medications  ?Medication Sig Dispense Refill  ? acetaminophen (TYLENOL) 500 MG tablet Take 1,000 mg by mouth every 6 (six) hours as needed for mild pain (or headaches).    ? arformoterol (BROVANA) 15 MCG/2ML NEBU Take 15 mcg by nebulization 2 (two) times daily as needed (for shortness of breath).    ? atorvastatin (LIPITOR) 20 MG tablet Take 20 mg by mouth daily.    ? azelastine (OPTIVAR) 0.05 % ophthalmic solution Place 1 drop into both eyes daily.    ? B-D UF III MINI PEN NEEDLES 31G X 5 MM MISC  SMARTSIG:1 Each SUB-Q 4 Times Daily    ? budesonide (PULMICORT) 0.25 MG/2ML nebulizer solution Take 2 mLs (0.25 mg total) by nebulization 2 (two) times daily. 120 mL 0  ? ciprofloxacin (CIPRO) 500 MG tablet Take 500 mg by mouth 2 (two) times daily.    ? Cyanocobalamin (B-12) 2500 MCG TABS Take 2,500 mcg by mouth at bedtime.     ? DULoxetine (CYMBALTA) 60 MG capsule Take 60 mg by mouth at bedtime.     ? ezetimibe (ZETIA) 10 MG tablet Take 10 mg by mouth at bedtime.     ? ferrous gluconate (FERGON) 324 MG tablet Take 324 mg by mouth every other day.    ? HYDROcodone-acetaminophen (NORCO/VICODIN) 5-325 MG tablet Take 1 tablet by mouth 4 (four) times daily as needed for pain.    ?  ipratropium-albuterol (DUONEB) 0.5-2.5 (3) MG/3ML SOLN Take 3 mLs by nebulization every 6 (six) hours as needed (wheezing).    ? ketorolac (ACULAR) 0.5 % ophthalmic solution Place 1 drop into both eyes 4 (four) times daily. 5 mL 0  ? loperamide (IMODIUM) 2 MG capsule Take by mouth as needed for diarrhea or loose stools.    ? metFORMIN (GLUCOPHAGE) 500 MG tablet Take 500 mg by mouth 2 (two) times daily.    ? metoprolol tartrate (LOPRESSOR) 25 MG tablet Take 25 mg by mouth daily.    ? NOVOLIN 70/30 KWIKPEN (70-30) 100 UNIT/ML KwikPen Inject 10 Units into the skin 2 (two) times daily.    ? ondansetron (ZOFRAN-ODT) 8 MG disintegrating tablet Take 8 mg by mouth every 8 (eight) hours as needed for nausea or vomiting (dissolve orally).    ? oxyCODONE (OXYCONTIN) 10 mg 12 hr tablet Take 10 mg by mouth daily.    ? pantoprazole (PROTONIX) 40 MG tablet Take one tablet once every morning (Patient taking differently: Take 40 mg by mouth daily before breakfast.) 30 tablet 5  ? phenazopyridine (PYRIDIUM) 100 MG tablet Take 100 mg by mouth daily as needed.    ? predniSONE (DELTASONE) 5 MG tablet Take 5 mg by mouth at bedtime.    ? psyllium (METAMUCIL) 58.6 % packet Take 1 packet by mouth daily. 30 each 12  ? tolterodine (DETROL LA) 2 MG 24 hr capsule Take 2  mg by mouth every evening.    ? ?No current facility-administered medications for this visit.  ? ? ?REVIEW OF SYSTEMS:   ?Constitutional: ( - ) fevers, ( - )  chills , ( - ) night sweats ?Eyes: ( - ) blurriness of visi

## 2021-07-11 ENCOUNTER — Telehealth: Payer: Self-pay | Admitting: Hematology and Oncology

## 2021-07-11 LAB — FERRITIN: Ferritin: 131 ng/mL (ref 11–307)

## 2021-07-11 NOTE — Telephone Encounter (Signed)
Scheduled appointment per 05/03 los. Patient aware.  ?

## 2021-07-16 ENCOUNTER — Other Ambulatory Visit: Payer: Self-pay | Admitting: Adult Medicine

## 2021-07-16 DIAGNOSIS — Z1231 Encounter for screening mammogram for malignant neoplasm of breast: Secondary | ICD-10-CM

## 2021-07-24 ENCOUNTER — Ambulatory Visit: Payer: PRIVATE HEALTH INSURANCE

## 2021-07-31 ENCOUNTER — Ambulatory Visit: Payer: PRIVATE HEALTH INSURANCE | Admitting: Nurse Practitioner

## 2021-07-31 ENCOUNTER — Telehealth: Payer: Self-pay

## 2021-07-31 NOTE — Telephone Encounter (Signed)
Returned patient's call regarding iron labs and supplements. Patient informed that iron levels were stable and that Dr. Chryl Heck does not see the need for additional iron supplementation at this time. Patient is to discontinue PO iron at this time. Patient reminded of follow-up appointment in September. Patient can call the office should she have any additional questions or concerns.

## 2021-08-01 ENCOUNTER — Ambulatory Visit: Payer: PRIVATE HEALTH INSURANCE

## 2021-08-03 ENCOUNTER — Ambulatory Visit
Admission: RE | Admit: 2021-08-03 | Discharge: 2021-08-03 | Disposition: A | Discharge status: Home / Self Care | Payer: PRIVATE HEALTH INSURANCE | Source: Ambulatory Visit | Attending: Adult Medicine | Admitting: Adult Medicine

## 2021-08-03 DIAGNOSIS — Z1231 Encounter for screening mammogram for malignant neoplasm of breast: Secondary | ICD-10-CM

## 2021-08-08 ENCOUNTER — Ambulatory Visit: Payer: PRIVATE HEALTH INSURANCE | Admitting: Cardiovascular Disease

## 2021-08-10 ENCOUNTER — Encounter: Payer: Self-pay | Admitting: *Deleted

## 2021-08-28 ENCOUNTER — Ambulatory Visit: Payer: PRIVATE HEALTH INSURANCE | Admitting: Cardiovascular Disease

## 2021-09-13 DIAGNOSIS — N390 Urinary tract infection, site not specified: Secondary | ICD-10-CM | POA: Diagnosis not present

## 2021-09-14 ENCOUNTER — Ambulatory Visit: Payer: PRIVATE HEALTH INSURANCE | Admitting: Cardiovascular Disease

## 2021-09-18 DIAGNOSIS — F5101 Primary insomnia: Secondary | ICD-10-CM | POA: Diagnosis not present

## 2021-09-18 DIAGNOSIS — F331 Major depressive disorder, recurrent, moderate: Secondary | ICD-10-CM | POA: Diagnosis not present

## 2021-09-25 DIAGNOSIS — F331 Major depressive disorder, recurrent, moderate: Secondary | ICD-10-CM | POA: Diagnosis not present

## 2021-09-25 DIAGNOSIS — F5101 Primary insomnia: Secondary | ICD-10-CM | POA: Diagnosis not present

## 2021-10-04 DIAGNOSIS — D734 Cyst of spleen: Secondary | ICD-10-CM | POA: Diagnosis not present

## 2021-10-04 DIAGNOSIS — F32A Depression, unspecified: Secondary | ICD-10-CM | POA: Diagnosis not present

## 2021-10-04 DIAGNOSIS — R109 Unspecified abdominal pain: Secondary | ICD-10-CM | POA: Diagnosis not present

## 2021-10-04 DIAGNOSIS — N3281 Overactive bladder: Secondary | ICD-10-CM | POA: Diagnosis not present

## 2021-10-04 DIAGNOSIS — M069 Rheumatoid arthritis, unspecified: Secondary | ICD-10-CM | POA: Diagnosis not present

## 2021-10-04 DIAGNOSIS — R300 Dysuria: Secondary | ICD-10-CM | POA: Diagnosis not present

## 2021-10-06 DIAGNOSIS — D649 Anemia, unspecified: Secondary | ICD-10-CM | POA: Diagnosis not present

## 2021-10-08 DIAGNOSIS — E119 Type 2 diabetes mellitus without complications: Secondary | ICD-10-CM | POA: Diagnosis not present

## 2021-10-08 DIAGNOSIS — Z79899 Other long term (current) drug therapy: Secondary | ICD-10-CM | POA: Diagnosis not present

## 2021-10-08 DIAGNOSIS — H43393 Other vitreous opacities, bilateral: Secondary | ICD-10-CM | POA: Diagnosis not present

## 2021-10-08 DIAGNOSIS — H532 Diplopia: Secondary | ICD-10-CM | POA: Diagnosis not present

## 2021-10-09 DIAGNOSIS — F5101 Primary insomnia: Secondary | ICD-10-CM | POA: Diagnosis not present

## 2021-10-09 DIAGNOSIS — F331 Major depressive disorder, recurrent, moderate: Secondary | ICD-10-CM | POA: Diagnosis not present

## 2021-10-15 DIAGNOSIS — Z79899 Other long term (current) drug therapy: Secondary | ICD-10-CM | POA: Diagnosis not present

## 2021-10-16 DIAGNOSIS — N8189 Other female genital prolapse: Secondary | ICD-10-CM | POA: Diagnosis not present

## 2021-10-17 DIAGNOSIS — E1151 Type 2 diabetes mellitus with diabetic peripheral angiopathy without gangrene: Secondary | ICD-10-CM | POA: Diagnosis not present

## 2021-10-17 DIAGNOSIS — N189 Chronic kidney disease, unspecified: Secondary | ICD-10-CM | POA: Diagnosis not present

## 2021-10-20 DIAGNOSIS — I10 Essential (primary) hypertension: Secondary | ICD-10-CM | POA: Diagnosis not present

## 2021-10-25 DIAGNOSIS — F331 Major depressive disorder, recurrent, moderate: Secondary | ICD-10-CM | POA: Diagnosis not present

## 2021-10-25 DIAGNOSIS — F5101 Primary insomnia: Secondary | ICD-10-CM | POA: Diagnosis not present

## 2021-10-30 DIAGNOSIS — M25572 Pain in left ankle and joints of left foot: Secondary | ICD-10-CM | POA: Diagnosis not present

## 2021-10-30 DIAGNOSIS — M799 Soft tissue disorder, unspecified: Secondary | ICD-10-CM | POA: Diagnosis not present

## 2021-10-31 DIAGNOSIS — R609 Edema, unspecified: Secondary | ICD-10-CM | POA: Diagnosis not present

## 2021-11-09 DIAGNOSIS — R300 Dysuria: Secondary | ICD-10-CM | POA: Diagnosis not present

## 2021-11-10 DIAGNOSIS — N390 Urinary tract infection, site not specified: Secondary | ICD-10-CM | POA: Diagnosis not present

## 2021-11-13 DIAGNOSIS — N3090 Cystitis, unspecified without hematuria: Secondary | ICD-10-CM | POA: Diagnosis not present

## 2021-11-13 DIAGNOSIS — F1120 Opioid dependence, uncomplicated: Secondary | ICD-10-CM | POA: Diagnosis not present

## 2021-11-15 DIAGNOSIS — M069 Rheumatoid arthritis, unspecified: Secondary | ICD-10-CM | POA: Diagnosis not present

## 2021-11-15 DIAGNOSIS — F32A Depression, unspecified: Secondary | ICD-10-CM | POA: Diagnosis not present

## 2021-11-15 DIAGNOSIS — N3281 Overactive bladder: Secondary | ICD-10-CM | POA: Diagnosis not present

## 2021-11-15 DIAGNOSIS — E1151 Type 2 diabetes mellitus with diabetic peripheral angiopathy without gangrene: Secondary | ICD-10-CM | POA: Diagnosis not present

## 2021-11-20 DIAGNOSIS — F5101 Primary insomnia: Secondary | ICD-10-CM | POA: Diagnosis not present

## 2021-11-20 DIAGNOSIS — F331 Major depressive disorder, recurrent, moderate: Secondary | ICD-10-CM | POA: Diagnosis not present

## 2021-11-22 DIAGNOSIS — F5101 Primary insomnia: Secondary | ICD-10-CM | POA: Diagnosis not present

## 2021-11-22 DIAGNOSIS — F331 Major depressive disorder, recurrent, moderate: Secondary | ICD-10-CM | POA: Diagnosis not present

## 2021-11-26 ENCOUNTER — Other Ambulatory Visit: Payer: Self-pay

## 2021-11-26 DIAGNOSIS — D500 Iron deficiency anemia secondary to blood loss (chronic): Secondary | ICD-10-CM

## 2021-11-27 ENCOUNTER — Inpatient Hospital Stay: Payer: Medicare PPO | Attending: Physician Assistant | Admitting: Physician Assistant

## 2021-11-27 ENCOUNTER — Other Ambulatory Visit: Payer: Self-pay

## 2021-11-27 ENCOUNTER — Inpatient Hospital Stay: Payer: Medicare PPO

## 2021-11-27 VITALS — BP 110/50 | HR 81 | Temp 97.5°F | Resp 16 | Ht 65.0 in | Wt 151.5 lb

## 2021-11-27 DIAGNOSIS — Z803 Family history of malignant neoplasm of breast: Secondary | ICD-10-CM | POA: Diagnosis not present

## 2021-11-27 DIAGNOSIS — F32A Depression, unspecified: Secondary | ICD-10-CM | POA: Diagnosis not present

## 2021-11-27 DIAGNOSIS — D500 Iron deficiency anemia secondary to blood loss (chronic): Secondary | ICD-10-CM | POA: Diagnosis not present

## 2021-11-27 DIAGNOSIS — R443 Hallucinations, unspecified: Secondary | ICD-10-CM | POA: Insufficient documentation

## 2021-11-27 DIAGNOSIS — M2550 Pain in unspecified joint: Secondary | ICD-10-CM | POA: Diagnosis not present

## 2021-11-27 DIAGNOSIS — Z90710 Acquired absence of both cervix and uterus: Secondary | ICD-10-CM | POA: Insufficient documentation

## 2021-11-27 DIAGNOSIS — Z87891 Personal history of nicotine dependence: Secondary | ICD-10-CM | POA: Insufficient documentation

## 2021-11-27 DIAGNOSIS — E119 Type 2 diabetes mellitus without complications: Secondary | ICD-10-CM | POA: Diagnosis not present

## 2021-11-27 LAB — CMP (CANCER CENTER ONLY)
ALT: 13 U/L (ref 0–44)
AST: 11 U/L — ABNORMAL LOW (ref 15–41)
Albumin: 3.4 g/dL — ABNORMAL LOW (ref 3.5–5.0)
Alkaline Phosphatase: 71 U/L (ref 38–126)
Anion gap: 5 (ref 5–15)
BUN: 25 mg/dL — ABNORMAL HIGH (ref 8–23)
CO2: 24 mmol/L (ref 22–32)
Calcium: 8.7 mg/dL — ABNORMAL LOW (ref 8.9–10.3)
Chloride: 110 mmol/L (ref 98–111)
Creatinine: 0.88 mg/dL (ref 0.44–1.00)
GFR, Estimated: >60 mL/min (ref >60)
Glucose, Bld: 196 mg/dL — ABNORMAL HIGH (ref 70–99)
Potassium: 4.5 mmol/L (ref 3.5–5.1)
Sodium: 139 mmol/L (ref 135–145)
Total Bilirubin: 0.4 mg/dL (ref 0.3–1.2)
Total Protein: 5.9 g/dL — ABNORMAL LOW (ref 6.5–8.1)

## 2021-11-27 LAB — CBC WITH DIFFERENTIAL (CANCER CENTER ONLY)
Abs Immature Granulocytes: 0.05 10*3/uL (ref 0.00–0.07)
Basophils Absolute: 0 10*3/uL (ref 0.0–0.1)
Basophils Relative: 1 %
Eosinophils Absolute: 0.2 10*3/uL (ref 0.0–0.5)
Eosinophils Relative: 2 %
HCT: 36.7 % (ref 36.0–46.0)
Hemoglobin: 11.8 g/dL — ABNORMAL LOW (ref 12.0–15.0)
Immature Granulocytes: 1 %
Lymphocytes Relative: 21 %
Lymphs Abs: 1.7 10*3/uL (ref 0.7–4.0)
MCH: 28.9 pg (ref 26.0–34.0)
MCHC: 32.2 g/dL (ref 30.0–36.0)
MCV: 89.7 fL (ref 80.0–100.0)
Monocytes Absolute: 0.4 10*3/uL (ref 0.1–1.0)
Monocytes Relative: 5 %
Neutro Abs: 5.6 10*3/uL (ref 1.7–7.7)
Neutrophils Relative %: 70 %
Platelet Count: 258 10*3/uL (ref 150–400)
RBC: 4.09 MIL/uL (ref 3.87–5.11)
RDW: 14.3 % (ref 11.5–15.5)
WBC Count: 7.8 10*3/uL (ref 4.0–10.5)
nRBC: 0 % (ref 0.0–0.2)

## 2021-11-27 LAB — IRON AND IRON BINDING CAPACITY (CC-WL,HP ONLY)
Iron: 41 ug/dL (ref 28–170)
Saturation Ratios: 17 % (ref 10.4–31.8)
TIBC: 238 ug/dL — ABNORMAL LOW (ref 250–450)
UIBC: 197 ug/dL (ref 148–442)

## 2021-11-27 LAB — FERRITIN: Ferritin: 100 ng/mL (ref 11–307)

## 2021-11-27 MED ORDER — LORATADINE 10 MG PO TABS: 10.0000 mg | ORAL_TABLET | Freq: Every day | ORAL | 3 refills | Status: DC

## 2021-11-27 NOTE — Progress Notes (Signed)
[facility] Telephone:(336) [phone]   Fax:(336) [phone]  PROGRESS NOTE  Patient Care Team: [name], MD as PCP - General (Internal Medicine) Hallows, Rhett K, MD as Consulting Physician (Orthopedic Surgery)   CHIEF COMPLAINTS/PURPOSE OF CONSULTATION:  Microcytic anemia  HISTORY OF PRESENTING ILLNESS:  [name] 81 y.o. female returns for a follow up for iron deficiency anemia. She was last seen by Dr. Chryl Heck on 07/10/2021.  In the interim, she denies any changes to her health.  [name] reports her energy levels are still fair.  She requires some assistance with her daily activities.  She still resides in a nursing home which has continued to make her feel depressed.  She adds that her relationship with her son has deteriorated which is adding to her depression.  She feels she does not have any support around her.  He is interested in pursuing counseling and psychological care.  She reports her hallucinations improved after she stopped taking her sleeping pill for a few days ago.  She denies any GI symptoms including nausea, vomiting or abdominal pain.  Her bowel habits are unchanged without any recurrent episodes of diarrhea or constipation.  She reports occasional right red blood on the toilet paper.  She denies fevers, chills, sweats, shortness of breath, chest pain or cough.  She has no other complaints.Rest of the pertinent 10 point ROS reviewed and negative  MEDICAL HISTORY:  Past Medical History:  Diagnosis Date   Acute pulmonary embolism ([place])    b/l; dx'ed May '22   Asthma    Complication of anesthesia    Anesthesia told her years ago she got too cold during surgery   Diabetes mellitus without complication ([place])    Emphysema    Fibromyalgia    Osteoporosis    Rheumatoid arthritis(714.0)    Sleep apnea    no cpap    SURGICAL HISTORY: Past Surgical History:  Procedure Laterality Date   ABDOMINAL HYSTERECTOMY     ANKLE FRACTURE SURGERY  2007    BREAST SURGERY     mammoplasty and then removed   COLONOSCOPY WITH PROPOFOL N/A 08/22/2020   Procedure: COLONOSCOPY WITH PROPOFOL;  Surgeon: Ronnette Juniper, MD;  [facility];  Service: Gastroenterology;  Laterality: N/A;  If schedule availability permits, would like to add on upper endoscopy as well   ENTEROSCOPY N/A 11/11/2020   Procedure: ENTEROSCOPY;  Surgeon: Arta Silence, MD;  [facility] END[facility]COPY;  Service: Endoscopy;  Laterality: N/A;   ESOPHAGOGASTRODUODENOSCOPY (EGD) WITH PROPOFOL N/A 08/22/2020   Procedure: ESOPHAGOGASTRODUODENOSCOPY (EGD) WITH PROPOFOL;  Surgeon: Ronnette Juniper, MD;  [facility];  Service: Gastroenterology;  Laterality: N/A;   FOOT SURGERY     numerous   GIVENS CAPSULE STUDY N/A 10/28/2020   Procedure: GIVENS CAPSULE STUDY;  Surgeon: Otis Brace, MD;  [facility];  Service: Gastroenterology;  Laterality: N/A;   HOT HEMOSTASIS N/A 08/22/2020   Procedure: HOT HEMOSTASIS (ARGON PLASMA COAGULATION/BICAP);  Surgeon: Ronnette Juniper, MD;  Location: [facility];  Service: Gastroenterology;  Laterality: N/A;   I & D EXTREMITY Right 10/26/2014   Procedure: IRRIGATION AND DEBRIDEMENT RIGHT KNEE ;  Surgeon: Gaynelle Arabian, MD;  Location: [facility];  Service: Orthopedics;  Laterality: Right;   IRRIGATION AND DEBRIDEMENT KNEE Right 03/09/2017   Procedure: IRRIGATION AND DEBRIDEMENT RIGHT KNEE PRE-PATELLAR BURSA;  Surgeon: Susa Day, MD;  Location: [facility];  Service: Orthopedics;  Laterality: Right;   KNEE BURSECTOMY Right 09/16/2014   Procedure: EXCISON OF PRE PATELLA BURSA  RIGHT KNEE ;  Surgeon: Gaynelle Arabian, MD;  [facility]: [facility];  Service: Orthopedics;  Laterality: Right;   PARTIAL HYSTERECTOMY  1970   POLYPECTOMY  08/22/2020   Procedure: POLYPECTOMY;  Surgeon: Ronnette Juniper, MD;  [facility];  Service: Gastroenterology;;   TOTAL KNEE ARTHROPLASTY  2005 / 2006   x2    SOCIAL HISTORY: Social History   Socioeconomic History   Marital  status: Divorced    Spouse name: Not on file   Number of children: Not on file   Years of education: Not on file   Highest education level: Not on file  Occupational History   Not on file  Tobacco Use   Smoking status: Former    Types: Cigarettes    Quit date: 03/11/1994    Years since quitting: 27.7   Smokeless tobacco: Never  Vaping Use   Vaping Use: Never used  Substance and Sexual Activity   Alcohol use: No   Drug use: No   Sexual activity: Not on file  Other Topics Concern   Not on file  Social History Narrative   Not on file   Social Determinants of Health   Financial Resource Strain: Low Risk  (01/08/2019)   Overall Financial Resource Strain (CARDIA)    Difficulty of Paying Living Expenses: Not hard at all  Food Insecurity: No Food Insecurity (09/12/2020)   Hunger Vital Sign    Worried About Running Out of Food in the Last Year: Never true    [place] in the Last Year: Never true  Transportation Needs: Unmet Transportation Needs (09/12/2020)   PRAPARE - Transportation    Lack of Transportation (Medical): Yes    Lack of Transportation (Non-Medical): Yes  Physical Activity: Inactive (01/08/2019)   Exercise Vital Sign    Days of Exercise per Week: 0 days    Minutes of Exercise per Session: 0 min  Stress: Stress Concern Present (01/08/2019)   [place]    Feeling of Stress : To some extent  Social Connections: Not on file  Intimate Partner Violence: Not At Risk (01/08/2019)   Humiliation, Afraid, Rape, and Kick questionnaire    Fear of Current or Ex-Partner: No    Emotionally Abused: No    Physically Abused: No    Sexually Abused: No    FAMILY HISTORY: Family History  Problem Relation Age of Onset   Hypertension Other    Diabetes Other    Stroke Other    Diabetes Mother    Diabetes Father    Diabetes Sister    Diabetes Maternal Aunt    Cancer Paternal Uncle    Cancer Paternal  Grandmother    Breast cancer Paternal Grandmother     ALLERGIES:  is allergic to penicillins, sulfamethoxazole-trimethoprim, adalimumab, hydroxychloroquine sulfate, meperidine, meperidine hcl, methotrexate, penicillin g sodium, phentermine-topiramate, sulfa antibiotics, and sulfonamide derivatives.  MEDICATIONS:  Current Outpatient Medications  Medication Sig Dispense Refill   acetaminophen (TYLENOL) 500 MG tablet Take 1,000 mg by mouth every 6 (six) hours as needed for mild pain (or headaches).     ascorbic acid (VITAMIN C) 500 MG tablet Take 500 mg by mouth 2 (two) times daily.     atorvastatin (LIPITOR) 20 MG tablet Take 20 mg by mouth daily.     azelastine (OPTIVAR) 0.05 % ophthalmic solution Place 1 drop into both eyes daily.     DULoxetine (CYMBALTA) 60 MG capsule Take 60 mg by mouth at bedtime.  ezetimibe (ZETIA) 10 MG tablet Take 10 mg by mouth at bedtime.      ferrous sulfate 325 (65 FE) MG tablet 1 tablet     folic acid (FOLVITE) 1 MG tablet Take 1 mg by mouth daily.     HYDROcodone-acetaminophen (NORCO) 10-325 MG tablet Take 1 tablet by mouth 3 (three) times daily as needed.     hydrocortisone ointment 0.5 % Apply 1 Application topically 2 (two) times daily. Apply to hips     insulin glargine (LANTUS) 100 unit/mL SOPN Inject 8 Units into the skin at bedtime.     ipratropium-albuterol (DUONEB) 0.5-2.5 (3) MG/3ML SOLN Take 3 mLs by nebulization every 6 (six) hours as needed (wheezing).     lidocaine (ASPERCREME LIDOCAINE) 4 % Place 1 patch onto the skin daily. To rt shoulder     loperamide (IMODIUM) 2 MG capsule Take by mouth as needed for diarrhea or loose stools.     metFORMIN (GLUCOPHAGE) 850 MG tablet Take 850 mg by mouth 2 (two) times daily.     metoprolol tartrate (LOPRESSOR) 25 MG tablet Take 25 mg by mouth daily.     mirabegron ER (MYRBETRIQ) 25 MG TB24 tablet Take 25 mg by mouth daily.     Olopatadine HCl (PATADAY) 0.7 % SOLN Place 1 drop into both eyes 2 (two) times  daily.     ondansetron (ZOFRAN-ODT) 8 MG disintegrating tablet Take 8 mg by mouth every 8 (eight) hours as needed for nausea or vomiting (dissolve orally).     pantoprazole (PROTONIX) 40 MG tablet Take one tablet once every morning (Patient taking differently: Take 40 mg by mouth daily before breakfast.) 30 tablet 5   Pollen Extracts (PROSTAT PO) Take 30 mLs by mouth.     predniSONE (DELTASONE) 5 MG tablet Take 5 mg by mouth at bedtime.     psyllium (METAMUCIL) 58.6 % packet Take 1 packet by mouth daily. 30 each 12   sodium chloride (OCEAN) 0.65 % SOLN nasal spray Place 1 spray into both nostrils as needed for congestion.     arformoterol (BROVANA) 15 MCG/2ML NEBU Take 15 mcg by nebulization 2 (two) times daily as needed (for shortness of breath). (Patient not taking: Reported on 11/27/2021)     B-D UF III MINI PEN NEEDLES 31G X 5 MM MISC SMARTSIG:1 Each SUB-Q 4 Times Daily (Patient not taking: Reported on 11/27/2021)     budesonide (PULMICORT) 0.25 MG/2ML nebulizer solution Take 2 mLs (0.25 mg total) by nebulization 2 (two) times daily. 120 mL 0   ciprofloxacin (CIPRO) 500 MG tablet Take 500 mg by mouth 2 (two) times daily. (Patient not taking: Reported on 11/27/2021)     Cyanocobalamin (B-12) 2500 MCG TABS Take 2,500 mcg by mouth at bedtime.  (Patient not taking: Reported on 11/27/2021)     ferrous gluconate (FERGON) 324 MG tablet Take 324 mg by mouth every other day. (Patient not taking: Reported on 11/27/2021)     HYDROcodone-acetaminophen (NORCO/VICODIN) 5-325 MG tablet Take 1 tablet by mouth 4 (four) times daily as needed for pain. (Patient not taking: Reported on 11/27/2021)     ketorolac (ACULAR) 0.5 % ophthalmic solution Place 1 drop into both eyes 4 (four) times daily. (Patient not taking: Reported on 11/27/2021) 5 mL 0   loratadine (CLARITIN) 10 MG tablet Take 1 tablet (10 mg total) by mouth daily. (Patient not taking: Reported on 11/27/2021) 30 tablet 3   metFORMIN (GLUCOPHAGE) 500 MG tablet Take  500 mg by mouth 2 (two)  times daily. (Patient not taking: Reported on 11/27/2021)     NOVOLIN 70/30 KWIKPEN (70-30) 100 UNIT/ML KwikPen Inject 10 Units into the skin 2 (two) times daily. (Patient not taking: Reported on 11/27/2021)     oxyCODONE (OXYCONTIN) 10 mg 12 hr tablet Take 10 mg by mouth daily. (Patient not taking: Reported on 11/27/2021)     phenazopyridine (PYRIDIUM) 100 MG tablet Take 100 mg by mouth daily as needed. (Patient not taking: Reported on 11/27/2021)     tolterodine (DETROL LA) 2 MG 24 hr capsule Take 2 mg by mouth every evening. (Patient not taking: Reported on 11/27/2021)     No current facility-administered medications for this visit.    REVIEW OF SYSTEMS:   Constitutional: ( - ) fevers, ( - )  chills , ( - ) night sweats Eyes: ( - ) blurriness of vision, ( - ) double vision, ( - ) watery eyes Ears, nose, mouth, throat, and face: ( - ) mucositis, ( - ) sore throat Respiratory: ( - ) cough, ( - ) dyspnea, ( - ) wheezes Cardiovascular: ( - ) palpitation, ( - ) chest discomfort, ( - ) lower extremity swelling Gastrointestinal:  ( - ) nausea, ( - ) heartburn, ( + ) change in bowel habits Skin: ( - ) abnormal skin rashes Lymphatics: ( - ) new lymphadenopathy, ( - ) easy bruising Neurological: ( - ) numbness, ( - ) tingling, ( - ) new weaknesses Behavioral/Psych: ( - ) mood change, ( - ) new changes  All other systems were reviewed with the patient and are negative.  PHYSICAL EXAMINATION: ECOG PERFORMANCE STATUS: 2 - Symptomatic, <50% confined to bed  Vitals:   11/27/21 1037  BP: (!) 110/50  Pulse: 81  Resp: 16  Temp: (!) 97.5 F (36.4 C)  SpO2: 99%    Filed Weights   11/27/21 1037  Weight: 151 lb 8 oz (68.7 kg)     GENERAL: female in NAD, elderly came in a wheelchair. Pulmonary/Chest: Effort normal and breath sounds normal. No respiratory distress. No wheezes. No rales.  Musculoskeletal: Severe arthritis Skin: Skin is warm and dry. No rash noted. Not  diaphoretic. No erythema. No pallor.  Psychiatric: Mood, memory and judgment normal.    LABORATORY DATA:  I have reviewed the data as listed    Latest Ref Rng & Units 11/27/2021   10:19 AM 07/10/2021    1:21 PM 06/14/2021    8:42 AM  CBC  WBC 4.0 - 10.5 K/uL 7.8  10.4  7.9   Hemoglobin 12.0 - 15.0 g/dL 11.8  11.3  10.7   Hematocrit 36.0 - 46.0 % 36.7  36.2  36.0   Platelets 150 - 400 K/uL 258  237  243        Latest Ref Rng & Units 11/27/2021   10:19 AM 07/10/2021    1:21 PM 06/14/2021    9:54 AM  CMP  Glucose 70 - 99 mg/dL 196  88    BUN 8 - 23 mg/dL 25  19    Creatinine 0.44 - 1.00 mg/dL 0.88  0.75    Sodium 135 - 145 mmol/L 139  141    Potassium 3.5 - 5.1 mmol/L 4.5  4.3  5.4   Chloride 98 - 111 mmol/L 110  107    CO2 22 - 32 mmol/L 24  24    Calcium 8.9 - 10.3 mg/dL 8.7  9.0    Total Protein 6.5 - 8.1 g/dL 5.9  6.0    Total Bilirubin 0.3 - 1.2 mg/dL 0.4  0.3    Alkaline Phos 38 - 126 U/L 71  65    AST 15 - 41 U/L 11  14    ALT 0 - 44 U/L 13  15     ASSESSMENT & PLAN [name] is a 81 y.o. female who presents for a follow up for iron deficiency anemia.   #Iron deficiency 2/2 GI bleed.  --Admitted from 10/27/20-10/30/20 due to acute blood loss anemia. Underwent capsule endoscopy that showed small bowel erosions, small bowel AVM. Eliquis was discontinued that hospital stay after discussion pulmonary as CT chest showed resolution of thrombus --Readmitted from 11/07/2020-11/14/2020 due anemia with rectal bleeding. Hgb dropped from 8 to 5.3. She received 2 units of PRBC. Undrewent small bowel enteroscopy that was nonrevealing. Received IV ferrlecit 250 mg on 11/12/2020.  --Most recently received IV venofer 200 mg once weekly x 5 doses from 04/10/2021-05/08/2021.  --Labs from today show improving anemia with Hgb 11.8, MCV 89.7. Iron panel shows serum iron 41, iron saturation 17%, ferritin 100. --Patient denies any signs of bleeding including hematochezia or melena. --No need for IV iron  at this time. Continue to take ferrous sulfate 325 mg once a day with a source of vitamin C.  --RTC in 3 months with repeat labs.   #Joint pain: --Likely secondary to OA and RA.  --She will follow-up with [facility]  # Depression,  --Due to dissatisfaction of living at nursing home and poor relationship with her son. -- She denies any active suicidal or homicidal intention. --Sent referral to psychology to help with counseling.   #4.  Hallucinations: --Symptoms improved after she held her insomnia medication --Continue to monitor.   Orders Placed This Encounter  Procedures   Ambulatory referral to Psychology    Referral Priority:   Routine    Referral Type:   Psychiatric    Referral Reason:   Specialty Services Required    Requested Specialty:   Psychology    Number of Visits Requested:   1    All questions were answered. The patient knows to call the clinic with any problems, questions or concerns.  I have spent a total of 30 minutes minutes of face-to-face and non-face-to-face time, preparing to see the patient,  performing a medically appropriate examination, counseling and educating the patient, ordering medications/tests, communicating with other health care professionals, documenting clinical information in the electronic health record, and care coordination.   Dede Query PA-C Dept of Hematology and [place] at [facility] Phone: [phone]

## 2021-11-29 DIAGNOSIS — M069 Rheumatoid arthritis, unspecified: Secondary | ICD-10-CM | POA: Diagnosis not present

## 2021-11-29 DIAGNOSIS — N3281 Overactive bladder: Secondary | ICD-10-CM | POA: Diagnosis not present

## 2021-11-29 DIAGNOSIS — F32A Depression, unspecified: Secondary | ICD-10-CM | POA: Diagnosis not present

## 2021-11-29 DIAGNOSIS — E1151 Type 2 diabetes mellitus with diabetic peripheral angiopathy without gangrene: Secondary | ICD-10-CM | POA: Diagnosis not present

## 2021-12-04 DIAGNOSIS — F5101 Primary insomnia: Secondary | ICD-10-CM | POA: Diagnosis not present

## 2021-12-04 DIAGNOSIS — F331 Major depressive disorder, recurrent, moderate: Secondary | ICD-10-CM | POA: Diagnosis not present

## 2021-12-11 DIAGNOSIS — F331 Major depressive disorder, recurrent, moderate: Secondary | ICD-10-CM | POA: Diagnosis not present

## 2021-12-11 DIAGNOSIS — F5101 Primary insomnia: Secondary | ICD-10-CM | POA: Diagnosis not present

## 2021-12-18 DIAGNOSIS — F5101 Primary insomnia: Secondary | ICD-10-CM | POA: Diagnosis not present

## 2021-12-18 DIAGNOSIS — F331 Major depressive disorder, recurrent, moderate: Secondary | ICD-10-CM | POA: Diagnosis not present

## 2021-12-27 ENCOUNTER — Ambulatory Visit (INDEPENDENT_AMBULATORY_CARE_PROVIDER_SITE_OTHER): Payer: Medicare PPO | Admitting: Psychologist

## 2021-12-27 DIAGNOSIS — F330 Major depressive disorder, recurrent, mild: Secondary | ICD-10-CM | POA: Diagnosis not present

## 2021-12-27 NOTE — Progress Notes (Signed)
                Alishah Schulte, PsyD 

## 2021-12-27 NOTE — Plan of Care (Signed)

## 2021-12-27 NOTE — Progress Notes (Signed)
[place] Counselor Initial Adult Exam  Name: [name] Date: 12/27/2021 MRN: [id number] DOB: 17-Oct-1940 PCP: [name], MD  Time spent: 10:06 am to 10:46 am; total time: 40 minutes  This session was held via in person. The patient consented to in-person therapy and was in the clinician's office. Limits of confidentiality were discussed with the patient.   Guardian/Payee:  NA    Paperwork requested: No   Reason for Visit /Presenting Problem: Depression  Mental Status Exam: Appearance:   Well Groomed     Behavior:  Appropriate  Motor:  Normal  Speech/Language:   Clear and Coherent  Affect:  Appropriate  Mood:  normal  Thought process:  normal  Thought content:    WNL  Sensory/Perceptual disturbances:    WNL  Orientation:  oriented to person, place, and time/date  Attention:  Good  Concentration:  Good  Memory:  WNL  Fund of knowledge:   Good  Insight:    Fair  Judgment:   Fair  Impulse Control:  Good    Reported Symptoms:  The patient endorsed experiencing the following: feeling down, sad, tearful, social isolation, thoughts of worthlessness, rumination of negative thoughts, and avoiding pleasurable activities. She denied suicidal and homicidal ideation.   Risk Assessment: Danger to Self:  No Self-injurious Behavior: No Danger to Others: No Duty to Warn:no Physical Aggression / Violence:No  Access to Firearms a concern: No  Gang Involvement:No  Patient / guardian was educated about steps to take if suicide or homicide risk level increases between visits: n/a While future psychiatric events cannot be accurately predicted, the patient does not currently require acute inpatient psychiatric care and does not currently meet [facility] involuntary commitment criteria.  Substance Abuse History: Current substance abuse: No     Past Psychiatric History:   Previous psychological history is significant for depression Outpatient  Providers:NA History of Psych Hospitalization: No  Psychological Testing:  NA    Abuse History:  Victim of: No.,  NA    Report needed: No. Victim of Neglect:No. Perpetrator of  NA   Witness / Exposure to Domestic Violence: No   Protective Services Involvement: No  Witness to [employer] Violence:  No   Family History:  Family History  Problem Relation Age of Onset   Hypertension Other    Diabetes Other    Stroke Other    Diabetes Mother    Diabetes Father    Diabetes Sister    Diabetes Maternal Aunt    Cancer Paternal Uncle    Cancer Paternal Grandmother    Breast cancer Paternal Grandmother     Living situation: the patient lives alone  Sexual Orientation: Straight  Relationship Status: divorced  Name of spouse / other:NA If a parent, number of children / ages:Two children named Fritz Pickerel and Gilmer: Denied  Museum/gallery curator Stress:  No   Income/Employment/Disability: Social Cytogeneticist: No   Educational History: Education: college graduate  Religion/Sprituality/World View: Christian  Any cultural differences that may affect / interfere with treatment:  not applicable   Recreation/Hobbies: Doing different activities  Stressors: Other: Relationship with Fritz Pickerel    Strengths: Self Advocate  Barriers:  Relationship with Fritz Pickerel   Legal History: Pending legal issue / charges: The patient has no significant history of legal issues. History of legal issue / charges:  NA  Medical History/Surgical History: reviewed Past Medical History:  Diagnosis Date   Acute pulmonary embolism ([place])    b/l; dx'ed May '22  Asthma    Complication of anesthesia    Anesthesia told her years ago she got too cold during surgery   Diabetes mellitus without complication ([place])    Emphysema    Fibromyalgia    Osteoporosis    Rheumatoid arthritis(714.0)    Sleep apnea    no cpap    Past Surgical History:  Procedure Laterality Date   ABDOMINAL  HYSTERECTOMY     ANKLE FRACTURE SURGERY  2007   BREAST SURGERY     mammoplasty and then removed   COLONOSCOPY WITH PROPOFOL N/A 08/22/2020   Procedure: COLONOSCOPY WITH PROPOFOL;  Surgeon: Ronnette Juniper, MD;  Location: [facility];  Service: Gastroenterology;  Laterality: N/A;  If schedule availability permits, would like to add on upper endoscopy as well   ENTEROSCOPY N/A 11/11/2020   Procedure: ENTEROSCOPY;  Surgeon: Arta Silence, MD;  Location: [facility] ENDOSCOPY;  Service: Endoscopy;  Laterality: N/A;   ESOPHAGOGASTRODUODENOSCOPY (EGD) WITH PROPOFOL N/A 08/22/2020   Procedure: ESOPHAGOGASTRODUODENOSCOPY (EGD) WITH PROPOFOL;  Surgeon: Ronnette Juniper, MD;  Location: [facility] ENDOSCOPY;  Service: Gastroenterology;  Laterality: N/A;   FOOT SURGERY     numerous   GIVENS CAPSULE STUDY N/A 10/28/2020   Procedure: GIVENS CAPSULE STUDY;  Surgeon: Otis Brace, MD;  Location: [facility];  Service: Gastroenterology;  Laterality: N/A;   HOT HEMOSTASIS N/A 08/22/2020   Procedure: HOT HEMOSTASIS (ARGON PLASMA COAGULATION/BICAP);  Surgeon: Ronnette Juniper, MD;  Location: [facility];  Service: Gastroenterology;  Laterality: N/A;   I & D EXTREMITY Right 10/26/2014   Procedure: IRRIGATION AND DEBRIDEMENT RIGHT KNEE ;  Surgeon: Gaynelle Arabian, MD;  Location: [facility];  Service: Orthopedics;  Laterality: Right;   IRRIGATION AND DEBRIDEMENT KNEE Right 03/09/2017   Procedure: IRRIGATION AND DEBRIDEMENT RIGHT KNEE PRE-PATELLAR BURSA;  Surgeon: Susa Day, MD;  Location: [facility];  Service: Orthopedics;  Laterality: Right;   KNEE BURSECTOMY Right 09/16/2014   Procedure: EXCISON OF PRE PATELLA BURSA RIGHT KNEE ;  Surgeon: Gaynelle Arabian, MD;  Location: [facility];  Service: Orthopedics;  Laterality: Right;   PARTIAL HYSTERECTOMY  1970   POLYPECTOMY  08/22/2020   Procedure: POLYPECTOMY;  Surgeon: Ronnette Juniper, MD;  Location: [facility];  Service: Gastroenterology;;   TOTAL KNEE ARTHROPLASTY  2005 / 2006   x2    Medications: Current  Outpatient Medications  Medication Sig Dispense Refill   acetaminophen (TYLENOL) 500 MG tablet Take 1,000 mg by mouth every 6 (six) hours as needed for mild pain (or headaches).     ascorbic acid (VITAMIN C) 500 MG tablet Take 500 mg by mouth 2 (two) times daily.     atorvastatin (LIPITOR) 20 MG tablet Take 20 mg by mouth daily.     azelastine (OPTIVAR) 0.05 % ophthalmic solution Place 1 drop into both eyes daily.     budesonide (PULMICORT) 0.25 MG/2ML nebulizer solution Take 2 mLs (0.25 mg total) by nebulization 2 (two) times daily. 120 mL 0   DULoxetine (CYMBALTA) 60 MG capsule Take 60 mg by mouth at bedtime.      ezetimibe (ZETIA) 10 MG tablet Take 10 mg by mouth at bedtime.      ferrous sulfate 325 (65 FE) MG tablet 1 tablet     folic acid (FOLVITE) 1 MG tablet Take 1 mg by mouth daily.     HYDROcodone-acetaminophen (NORCO) 10-325 MG tablet Take 1 tablet by mouth 3 (three) times daily as needed.     HYDROcodone-acetaminophen (NORCO/VICODIN) 5-325 MG tablet Take 1 tablet by mouth 4 (four) times  daily as needed for pain. (Patient not taking: Reported on 11/27/2021)     hydrocortisone ointment 0.5 % Apply 1 Application topically 2 (two) times daily. Apply to hips     insulin glargine (LANTUS) 100 unit/mL SOPN Inject 8 Units into the skin at bedtime.     ipratropium-albuterol (DUONEB) 0.5-2.5 (3) MG/3ML SOLN Take 3 mLs by nebulization every 6 (six) hours as needed (wheezing).     lidocaine (ASPERCREME LIDOCAINE) 4 % Place 1 patch onto the skin daily. To rt shoulder     loperamide (IMODIUM) 2 MG capsule Take by mouth as needed for diarrhea or loose stools.     metFORMIN (GLUCOPHAGE) 850 MG tablet Take 850 mg by mouth 2 (two) times daily.     metoprolol tartrate (LOPRESSOR) 25 MG tablet Take 25 mg by mouth daily.     mirabegron ER (MYRBETRIQ) 25 MG TB24 tablet Take 25 mg by mouth daily.     NOVOLIN 70/30 KWIKPEN (70-30) 100 UNIT/ML KwikPen Inject 10 Units into the skin 2 (two) times daily. (Patient  not taking: Reported on 11/27/2021)     Olopatadine HCl (PATADAY) 0.7 % SOLN Place 1 drop into both eyes 2 (two) times daily.     ondansetron (ZOFRAN-ODT) 8 MG disintegrating tablet Take 8 mg by mouth every 8 (eight) hours as needed for nausea or vomiting (dissolve orally).     pantoprazole (PROTONIX) 40 MG tablet Take one tablet once every morning (Patient taking differently: Take 40 mg by mouth daily before breakfast.) 30 tablet 5   Pollen Extracts (PROSTAT PO) Take 30 mLs by mouth.     predniSONE (DELTASONE) 5 MG tablet Take 5 mg by mouth at bedtime.     psyllium (METAMUCIL) 58.6 % packet Take 1 packet by mouth daily. 30 each 12   sodium chloride (OCEAN) 0.65 % SOLN nasal spray Place 1 spray into both nostrils as needed for congestion.     No current facility-administered medications for this visit.    Allergies  Allergen Reactions   Penicillins Hives    Has patient had a PCN reaction causing immediate rash, facial/tongue/throat swelling, SOB or lightheadedness with hypotension: Yes Has patient had a PCN reaction causing severe rash involving mucus membranes or skin necrosis: No Has patient had a PCN reaction that required hospitalization: No Has patient had a PCN reaction occurring within the last 10 years: No If all of the above answers are "NO", then may proceed with Cephalosporin use. Patient has tolerated ceftriaxone in the recent past.   Sulfamethoxazole-Trimethoprim Nausea And Vomiting and Other (See Comments)    Stomach problems   Adalimumab Other (See Comments)    "Not effective"   Hydroxychloroquine Sulfate Other (See Comments)    "Plaquenil did not work after [employer]"   Meperidine Other (See Comments)    Cramping   Meperidine Hcl Other (See Comments)    Cramping    Methotrexate Nausea And Vomiting and Other (See Comments)    "Projectile vomiting"   Penicillin G Sodium Hives   Phentermine-Topiramate Nausea Only and Other (See Comments)    "Nausea and dizziness & on  re-trial, mental side effects so really cannot take it   Sulfa Antibiotics Nausea And Vomiting and Other (See Comments)    Stomach problems/GI upset, also   Sulfonamide Derivatives Nausea And Vomiting and Other (See Comments)    "Stomach problems"    Diagnoses:  F33.0 major depressive affective disorder, recurrent, mild  Plan of Care: The patient is a 81 year old  Caucasian female who was referred due to experiencing depression. The patient lives in a nursing home. The patient meets criteria for a diagnosis of F33.0 major depressive affective disorder based off of the following: feeling down, sad, tearful, social isolation, thoughts of worthlessness, rumination of negative thoughts, and avoiding pleasurable activities. She denied suicidal and homicidal ideation.   The patient stated that she wanted to work towards a resolution with her son, Fritz Pickerel. She also wants to process thoughts and emotions and work towards a purpose.  This psychologist makes the recommendation that the patient participate in therapy bi-weekly if possible   Conception Chancy, PsyD

## 2021-12-28 DIAGNOSIS — F331 Major depressive disorder, recurrent, moderate: Secondary | ICD-10-CM | POA: Diagnosis not present

## 2021-12-28 DIAGNOSIS — F5101 Primary insomnia: Secondary | ICD-10-CM | POA: Diagnosis not present

## 2021-12-28 DIAGNOSIS — D509 Iron deficiency anemia, unspecified: Secondary | ICD-10-CM | POA: Diagnosis not present

## 2021-12-28 DIAGNOSIS — E1140 Type 2 diabetes mellitus with diabetic neuropathy, unspecified: Secondary | ICD-10-CM | POA: Diagnosis not present

## 2021-12-29 DIAGNOSIS — D649 Anemia, unspecified: Secondary | ICD-10-CM | POA: Diagnosis not present

## 2021-12-31 ENCOUNTER — Emergency Department (HOSPITAL_COMMUNITY)
Admission: EM | Admit: 2021-12-31 | Discharge: 2021-12-31 | Disposition: A | Discharge status: Home / Self Care | Payer: Medicare PPO | Attending: Emergency Medicine | Admitting: Emergency Medicine

## 2021-12-31 ENCOUNTER — Other Ambulatory Visit: Payer: Self-pay

## 2021-12-31 ENCOUNTER — Emergency Department (HOSPITAL_COMMUNITY): Payer: Medicare PPO

## 2021-12-31 ENCOUNTER — Encounter (HOSPITAL_COMMUNITY): Payer: Self-pay

## 2021-12-31 DIAGNOSIS — R531 Weakness: Secondary | ICD-10-CM | POA: Diagnosis not present

## 2021-12-31 DIAGNOSIS — M79642 Pain in left hand: Secondary | ICD-10-CM | POA: Diagnosis not present

## 2021-12-31 DIAGNOSIS — R9431 Abnormal electrocardiogram [ECG] [EKG]: Secondary | ICD-10-CM | POA: Diagnosis not present

## 2021-12-31 DIAGNOSIS — M79603 Pain in arm, unspecified: Secondary | ICD-10-CM | POA: Insufficient documentation

## 2021-12-31 DIAGNOSIS — M549 Dorsalgia, unspecified: Secondary | ICD-10-CM | POA: Diagnosis not present

## 2021-12-31 DIAGNOSIS — M79605 Pain in left leg: Secondary | ICD-10-CM | POA: Insufficient documentation

## 2021-12-31 DIAGNOSIS — R4781 Slurred speech: Secondary | ICD-10-CM | POA: Diagnosis not present

## 2021-12-31 DIAGNOSIS — M545 Low back pain, unspecified: Secondary | ICD-10-CM | POA: Diagnosis not present

## 2021-12-31 DIAGNOSIS — Z7401 Bed confinement status: Secondary | ICD-10-CM | POA: Diagnosis not present

## 2021-12-31 DIAGNOSIS — M542 Cervicalgia: Secondary | ICD-10-CM | POA: Diagnosis not present

## 2021-12-31 DIAGNOSIS — M79641 Pain in right hand: Secondary | ICD-10-CM | POA: Diagnosis not present

## 2021-12-31 DIAGNOSIS — M2550 Pain in unspecified joint: Secondary | ICD-10-CM | POA: Diagnosis not present

## 2021-12-31 DIAGNOSIS — M79643 Pain in unspecified hand: Secondary | ICD-10-CM | POA: Insufficient documentation

## 2021-12-31 LAB — COMPREHENSIVE METABOLIC PANEL
ALT: 11 U/L (ref 0–44)
AST: 12 U/L — ABNORMAL LOW (ref 15–41)
Albumin: 3 g/dL — ABNORMAL LOW (ref 3.5–5.0)
Alkaline Phosphatase: 67 U/L (ref 38–126)
Anion gap: 8 (ref 5–15)
BUN: 19 mg/dL (ref 8–23)
CO2: 24 mmol/L (ref 22–32)
Calcium: 8.8 mg/dL — ABNORMAL LOW (ref 8.9–10.3)
Chloride: 109 mmol/L (ref 98–111)
Creatinine, Ser: 0.72 mg/dL (ref 0.44–1.00)
GFR, Estimated: >60 mL/min (ref >60)
Glucose, Bld: 124 mg/dL — ABNORMAL HIGH (ref 70–99)
Potassium: 5.2 mmol/L — ABNORMAL HIGH (ref 3.5–5.1)
Sodium: 141 mmol/L (ref 135–145)
Total Bilirubin: 0.5 mg/dL (ref 0.3–1.2)
Total Protein: 6.4 g/dL — ABNORMAL LOW (ref 6.5–8.1)

## 2021-12-31 LAB — I-STAT CHEM 8, ED
BUN: 18 mg/dL (ref 8–23)
Calcium, Ion: 1.22 mmol/L (ref 1.15–1.40)
Chloride: 106 mmol/L (ref 98–111)
Creatinine, Ser: 0.8 mg/dL (ref 0.44–1.00)
Glucose, Bld: 123 mg/dL — ABNORMAL HIGH (ref 70–99)
HCT: 32 % — ABNORMAL LOW (ref 36.0–46.0)
Hemoglobin: 10.9 g/dL — ABNORMAL LOW (ref 12.0–15.0)
Potassium: 5 mmol/L (ref 3.5–5.1)
Sodium: 141 mmol/L (ref 135–145)
TCO2: 25 mmol/L (ref 22–32)

## 2021-12-31 LAB — CBC
HCT: 34.9 % — ABNORMAL LOW (ref 36.0–46.0)
Hemoglobin: 11 g/dL — ABNORMAL LOW (ref 12.0–15.0)
MCH: 28.7 pg (ref 26.0–34.0)
MCHC: 31.5 g/dL (ref 30.0–36.0)
MCV: 91.1 fL (ref 80.0–100.0)
Platelets: 307 10*3/uL (ref 150–400)
RBC: 3.83 MIL/uL — ABNORMAL LOW (ref 3.87–5.11)
RDW: 14 % (ref 11.5–15.5)
WBC: 7.3 10*3/uL (ref 4.0–10.5)
nRBC: 0 % (ref 0.0–0.2)

## 2021-12-31 LAB — URINALYSIS, ROUTINE W REFLEX MICROSCOPIC
Bilirubin Urine: NEGATIVE
Glucose, UA: NEGATIVE mg/dL
Hgb urine dipstick: NEGATIVE
Ketones, ur: NEGATIVE mg/dL
Leukocytes,Ua: NEGATIVE
Nitrite: NEGATIVE
Protein, ur: NEGATIVE mg/dL
Specific Gravity, Urine: 1.023 (ref 1.005–1.030)
pH: 5 (ref 5.0–8.0)

## 2021-12-31 LAB — DIFFERENTIAL
Abs Immature Granulocytes: 0.06 10*3/uL (ref 0.00–0.07)
Basophils Absolute: 0 10*3/uL (ref 0.0–0.1)
Basophils Relative: 1 %
Eosinophils Absolute: 0.1 10*3/uL (ref 0.0–0.5)
Eosinophils Relative: 1 %
Immature Granulocytes: 1 %
Lymphocytes Relative: 22 %
Lymphs Abs: 1.6 10*3/uL (ref 0.7–4.0)
Monocytes Absolute: 0.4 10*3/uL (ref 0.1–1.0)
Monocytes Relative: 6 %
Neutro Abs: 5.1 10*3/uL (ref 1.7–7.7)
Neutrophils Relative %: 69 %

## 2021-12-31 LAB — ETHANOL: Alcohol, Ethyl (B): <10 mg/dL (ref <10)

## 2021-12-31 LAB — PROTIME-INR
INR: 1.1 (ref 0.8–1.2)
Prothrombin Time: 13.6 seconds (ref 11.4–15.2)

## 2021-12-31 LAB — RAPID URINE DRUG SCREEN, HOSP PERFORMED
Amphetamines: NOT DETECTED
Barbiturates: NOT DETECTED
Benzodiazepines: NOT DETECTED
Cocaine: NOT DETECTED
Opiates: POSITIVE — AB
Tetrahydrocannabinol: NOT DETECTED

## 2021-12-31 LAB — APTT: aPTT: 29 seconds (ref 24–36)

## 2021-12-31 MED ORDER — OXYCODONE-ACETAMINOPHEN 5-325 MG PO TABS
1.0000 | ORAL_TABLET | Freq: Once | ORAL | Status: DC
  Filled 2021-12-31: qty 1

## 2021-12-31 NOTE — ED Provider Triage Note (Addendum)
Emergency Medicine Provider Triage Evaluation Note  [name] , a 81 y.o. female  was evaluated in triage.  Pt complains of body pain x 2 days, acute on chronic. Denies falls or injuries, hx RA, OA. Called back to talk to patient again, states she forgot to tell me she came today because she was afraid she had a stroke this morning. States she went to bed early last night, unable to say when but was awake at 1am. At 5:30 at she woke up and noted her speech was slurred and she thought that her hands were paralyzed- chronic pain and deformity but felt particularly unable to move her hands in that moment, also felt like something hit her big toe. Patient told staff she thought she had a stroke and was told maybe the dr would see her later if they came in today. Patient felt like no one cared about her so she came to the ER. Notes her speech is back to normal currently and her hands are back to baseline status.  Review of Systems  Positive: As above  Negative: As above  Physical Exam  SpO2 97%  Gen:   Awake, no distress   Resp:  Normal effort  MSK:   Chronic deformity hands Other:    Medical Decision Making  Medically screening exam initiated at 7:55 AM.  Appropriate orders placed.  [name] was informed that the remainder of the evaluation will be completed by another provider, this initial triage assessment does not replace that evaluation, and the importance of remaining in the ED until their evaluation is complete.     Tacy Learn, PA-C 12/31/21 0758    Tacy Learn, PA-C 12/31/21 [phone]

## 2021-12-31 NOTE — ED Notes (Signed)
Ptar called 

## 2021-12-31 NOTE — ED Triage Notes (Signed)
Per EMS- patient is from [facility]. Patient c/o chronic pain all over. Patient was given Norco 10/325 mg at 0500 today at the facility.

## 2021-12-31 NOTE — ED Provider Notes (Signed)
[facility] DEPT Provider Note   CSN: [id number] Arrival date & time: 12/31/21  0744     History Chief Complaint  Patient presents with   Pain    HPI [name] is a 81 y.o. female presenting for diffuse allover pain.  Endorses hand pain, neck pain, back pain, arm pain.  Leg pain.  She denies fevers or chills, nausea vomiting, syncope shortness of breath.  Is otherwise at her baseline.  She states that she was given Norco by her skilled nursing facility prior to coming in and her pains are diffusely better.  She does think that she may be had an episode of slurred speech this morning but states that she has slurred speech every morning.  She is now asymptomatic and would like to return to her facility.   Patient's recorded medical, surgical, social, medication list and allergies were reviewed in the Snapshot window as part of the initial history.   Review of Systems   Review of Systems  Constitutional:  Negative for chills and fever.  HENT:  Negative for ear pain and sore throat.   Eyes:  Negative for pain and visual disturbance.  Respiratory:  Negative for cough and shortness of breath.   Cardiovascular:  Negative for chest pain and palpitations.  Gastrointestinal:  Negative for abdominal pain and vomiting.  Genitourinary:  Negative for dysuria and hematuria.  Musculoskeletal:  Positive for arthralgias and back pain.  Skin:  Negative for color change and rash.  Neurological:  Negative for seizures and syncope.  All other systems reviewed and are negative.   Physical Exam Updated Vital Signs BP 111/66   Pulse 92   Temp 97.8 F (36.6 C) (Oral)   Resp 20   Ht '5\' 5"'$  (1.651 m)   Wt 61.1 kg   SpO2 95%   BMI 22.40 kg/m  Physical Exam Vitals and nursing note reviewed.  Constitutional:      General: She is not in acute distress.    Appearance: She is well-developed.  HENT:     Head: Normocephalic and atraumatic.  Eyes:      Conjunctiva/sclera: Conjunctivae normal.  Cardiovascular:     Rate and Rhythm: Normal rate and regular rhythm.     Heart sounds: No murmur heard. Pulmonary:     Effort: Pulmonary effort is normal. No respiratory distress.     Breath sounds: Normal breath sounds.  Abdominal:     General: There is no distension.     Palpations: Abdomen is soft.     Tenderness: There is no abdominal tenderness. There is no right CVA tenderness or left CVA tenderness.  Musculoskeletal:        General: Deformity (Sequelae of advanced degenerative changes appreciated with multiple swollen metacarpal joints, deformities from chronic contractures of the hands.) present. No swelling or tenderness. Normal range of motion.     Cervical back: Neck supple.  Skin:    General: Skin is warm and dry.  Neurological:     General: No focal deficit present.     Mental Status: She is alert and oriented to person, place, and time. Mental status is at baseline.     Cranial Nerves: No cranial nerve deficit.      ED Course/ Medical Decision Making/ A&P    Procedures Procedures   Medications Ordered in ED Medications  oxyCODONE-acetaminophen (PERCOCET/ROXICET) 5-325 MG per tablet 1 tablet (1 tablet Oral Not Given 12/31/21 0805)    Medical Decision Making:    Leslie Roys  KINSLEE [name] is a 81 y.o. female who presented to the ED today with diffuse musculoskeletal pain detailed above.     Patient's presentation is complicated by their history of advanced age, multiple underlying comorbid medical problems.  Patient placed on continuous vitals and telemetry monitoring while in ED which was reviewed periodically.   Complete initial physical exam performed, notably the patient  was hemodynamically stable in no acute distress.      Reviewed and confirmed nursing documentation for past medical history, family history, social history.    Initial Assessment:   With the patient's presentation of arthralgias , most likely diagnosis is  progression of known arthritis. Other diagnoses were considered including (but not limited to) infection, cellulitis, CVA, metabolic disruption, endocrinologic dysfunction. These are considered less likely due to history of present illness and physical exam findings.   This is most consistent with an acute life/limb threatening illness complicated by underlying chronic conditions.  Initial Plan:  Screening labs including CBC and Metabolic panel to evaluate for infectious or metabolic etiology of disease.  CXR to evaluate for structural/infectious intrathoracic pathology.  EKG to evaluate for cardiac pathology. CT head to evaluate for intracranial abnormality Objective evaluation as below reviewed with plan for close reassessment  Initial Study Results:   Laboratory  All laboratory results reviewed without evidence of clinically relevant pathology.    EKG EKG was reviewed independently. Rate, rhythm, axis, intervals all examined and without medically relevant abnormality. ST segments without concerns for elevations.    Radiology  All images reviewed independently. Agree with radiology report at this time.   CT HEAD WO CONTRAST  Result Date: 12/31/2021 CLINICAL DATA:  Neuro deficit, acute, stroke suspected. Chronic pain. EXAM: CT HEAD WITHOUT CONTRAST TECHNIQUE: Contiguous axial images were obtained from the base of the skull through the vertex without intravenous contrast. RADIATION DOSE REDUCTION: This exam was performed according to the departmental dose-optimization program which includes automated exposure control, adjustment of the mA and/or kV according to patient size and/or use of iterative reconstruction technique. COMPARISON:  11/09/2020. FINDINGS: Brain: Age related volume loss. Chronic small-vessel ischemic changes of the cerebral hemispheric white matter as seen previously. No sign acute infarction, mass lesion hemorrhage, hydrocephalus or extra-axial collection. Vascular: There is  atherosclerotic calcification of the major vessels at the base of the brain. Skull: Negative Sinuses/Orbits: Clear/normal Other: None IMPRESSION: No acute CT finding. Age related volume loss. Chronic small-vessel ischemic changes of the cerebral hemispheric white matter. Electronically Signed   By: Nelson Chimes M.D.   On: 12/31/2021 09:02     Final Assessment and Plan:   On repeat evaluation after 4 hours in emergency department, patient remains asymptomatic.  She continues to request discharge back to her facility.  Laboratory evaluation consistent with baseline no focal pathology appreciated.  CT head without acute intracranial pathology.  Patient refused any further pain medication as her pain is improved.  Ultimately this is likely progression of her known arthritis, no acute pathology identified today and patient stable for outpatient care management to primary care provider.  Contact information for connecting with outpatient primary care provider provided to the patient patient discharged with no further acute events.   Clinical Impression:  1. Polyarthralgia      Discharge   Final Clinical Impression(s) / ED Diagnoses Final diagnoses:  Polyarthralgia    Rx / DC Orders ED Discharge Orders     None         Tretha Sciara, MD 12/31/21 1136

## 2022-01-01 DIAGNOSIS — F331 Major depressive disorder, recurrent, moderate: Secondary | ICD-10-CM | POA: Diagnosis not present

## 2022-01-02 DIAGNOSIS — R739 Hyperglycemia, unspecified: Secondary | ICD-10-CM | POA: Diagnosis not present

## 2022-01-08 ENCOUNTER — Telehealth: Payer: Self-pay

## 2022-01-08 NOTE — Telephone Encounter (Signed)
        Patient  visited [facility] on 10/23    Telephone encounter attempt :  1st  A HIPAA compliant voice message was left requesting a return call.  Instructed patient to call back     [facility]  [phone] [address], [place], [place] [address] Phone: [phone] Email: [email or web address]

## 2022-01-09 ENCOUNTER — Encounter: Payer: Self-pay | Admitting: Internal Medicine

## 2022-01-09 ENCOUNTER — Non-Acute Institutional Stay: Payer: Medicaid Other | Admitting: Internal Medicine

## 2022-01-09 VITALS — BP 132/64 | HR 82 | Temp 97.5°F | Resp 16 | Wt 152.0 lb

## 2022-01-09 DIAGNOSIS — M069 Rheumatoid arthritis, unspecified: Secondary | ICD-10-CM

## 2022-01-09 DIAGNOSIS — Z515 Encounter for palliative care: Secondary | ICD-10-CM

## 2022-01-09 NOTE — Progress Notes (Signed)
[name]: [phone]  Fax: [phone]   Date of encounter: 01/09/22 9:51 AM PATIENT NAME: [name] [address] [place] [address]   [phone] (home)  DOB: [birth year] MRN: [id number] PRIMARY CARE PROVIDER:    Pcp, No,  No address on file None  REFERRING PROVIDER:   Dr. Sherril Croon  RESPONSIBLE PARTY:    Contact Information     Name Relation Home Work [place] Son   [phone]   Dravitz,Pam Daughter   [phone]        I met face to face with patient and family in [facility]. Palliative Care was asked to follow this patient by consultation request of  Dr. Sherril Croon to address advance care planning and complex medical decision making. This is follow-up visit.                                     ASSESSMENT AND PLAN / RECOMMENDATIONS:   Advance Care Planning/Goals of Care: Goals include to maximize quality of life and symptom management. Patient/health care surrogate gave his/her permission to discuss.Our advance care planning conversation included a discussion about:    The value and importance of advance care planning  Experiences with loved ones who have been seriously ill or have died  Exploration of personal, cultural or spiritual beliefs that might influence medical decisions  Exploration of goals of care in the event of a sudden injury or illness  Identification  of a healthcare agent  Review and updating or creation of an  advance directive document . Decision not to resuscitate or to de-escalate disease focused treatments due to poor prognosis. CODE STATUS:  DNR  Symptom Management/Plan: RA flare:  prednisone taper with 40m x 2 d, 23mx 2 d, 1027m 2 d, then return to baseline 5mg74mednisone daily--ok to take the regular prednisone and add the taper to ensure that the regular dose is not accidentally stopped leading to an adrenal crisis -if hallucinations  worsen, may need antipsychotic temporarily, but this should help her pain; unclear if hallucinations are due to current opioids--may need to switch opioid for future if this is the case -I did review her most recent labs and hgb was actually higher (11 range) suggesting no active GI bleeding   Palliative care encounter:  shared with pt that she will be followed by NP from our team in the future and main palliative number provided as she was calling me directly.  Follow up Palliative Care Visit: Palliative care will continue to follow for complex medical decision making, advance care planning, and clarification of goals. Return 4 weeks or prn.   This visit was coded based on medical decision making (MDM).  PPS: 40%  HOSPICE ELIGIBILITY/DIAGNOSIS: TBD  Chief Complaint: Follow-up palliative visit  HISTORY OF PRESENT ILLNESS:  [name] 81 y60. year old female  with RA, chronic interstitial lung disease, h/o PE, dysphagia, DMII, osteoporosis, right knee bursitis, CKD3, anemia, fibromyalgia, hyperlipidemia, prior GI bleed, among others seen in palliative f/u.   Pt was seen in the ED 10/23 with polyarthralgias.  She was given one percocet and 4 hrs later was reportedly back to baseline.  We'd seen her and adjusted her medications at one point, but she's not been seen in some time and facility has not reported concerns about her.  She called  me yesterday requesting a call and follow-up.  She's in bed when I see her in the middle of the day.  Reports upset stomach.  Has been refusing pain meds due to hallucinations.  Unclear if related or not.     History obtained from review of EMR, discussion with primary team, and interview with family, facility staff/caregiver and/or Ms. Ghazarian.  I reviewed available labs, medications, imaging, studies and related documents from the EMR.  Records reviewed and summarized above.   ROS Review of Systemssee hpi  Physical Exam: Vitals:   01/09/22 0950  BP:  132/64  Pulse: 82  Resp: 16  Temp: (!) 97.5 F (36.4 C)  SpO2: 96%  Weight: 152 lb (68.9 kg)   Body mass index is 25.29 kg/m. Wt Readings from Last 500 Encounters:  01/09/22 152 lb (68.9 kg)  12/31/21 134 lb 9.6 oz (61.1 kg)  11/27/21 151 lb 8 oz (68.7 kg)  07/10/21 143 lb 14.4 oz (65.3 kg)  06/14/21 147 lb (66.7 kg)  05/08/21 137 lb (62.1 kg)  05/01/21 137 lb (62.1 kg)  04/24/21 138 lb (62.6 kg)  04/18/21 138 lb (62.6 kg)  04/10/21 133 lb (60.3 kg)  11/15/20 154 lb 5.2 oz (70 kg)  10/28/20 147 lb 14.9 oz (67.1 kg)  09/14/20 161 lb 3.2 oz (73.1 kg)  08/31/20 161 lb 3.2 oz (73.1 kg)  08/22/20 182 lb 12.2 oz (82.9 kg)  07/22/20 173 lb 1 oz (78.5 kg)  05/31/20 189 lb 6 oz (85.9 kg)  05/26/20 189 lb 6 oz (85.9 kg)  10/28/18 170 lb (77.1 kg)  10/21/18 170 lb (77.1 kg)  05/14/18 182 lb 1.6 oz (82.6 kg)  05/07/18 187 lb (84.8 kg)  01/19/18 187 lb (84.8 kg)  04/08/17 161 lb (73 kg)  03/14/17 173 lb 8 oz (78.7 kg)  03/09/17 173 lb 8 oz (78.7 kg)  01/15/17 177 lb (80.3 kg)  12/24/16 182 lb 9.6 oz (82.8 kg)  09/13/16 195 lb (88.5 kg)  03/18/16 197 lb (89.4 kg)  08/30/15 200 lb 6.4 oz (90.9 kg)  05/22/15 180 lb (81.6 kg)  01/05/15 185 lb (83.9 kg)  10/26/14 189 lb 6.4 oz (85.9 kg)  09/16/14 188 lb (85.3 kg)  07/25/14 194 lb (88 kg)  12/20/13 190 lb (86.2 kg)  12/06/13 190 lb (86.2 kg)  07/09/13 204 lb (92.5 kg)  06/24/13 200 lb (90.7 kg)  01/06/13 198 lb (89.8 kg)  12/09/12 198 lb (89.8 kg)  06/25/12 195 lb (88.5 kg)  06/11/12 195 lb (88.5 kg)  08/22/11 185 lb (83.9 kg)  08/08/11 185 lb (83.9 kg)  03/21/11 185 lb (83.9 kg)  03/07/11 185 lb (83.9 kg)   Physical Exam Constitutional:      Appearance: Normal appearance.  HENT:     Head: Normocephalic and atraumatic.  Cardiovascular:     Rate and Rhythm: Normal rate and regular rhythm.  Pulmonary:     Effort: Pulmonary effort is normal.     Breath sounds: Normal breath sounds.  Abdominal:     General: Bowel  sounds are normal.     Palpations: Abdomen is soft.  Musculoskeletal:        General: Deformity present. Normal range of motion.     Comments: Deformity of fingers/hands, toes; uses power chair for mobility  Skin:    Capillary Refill: Capillary refill takes less than 2 seconds.     Coloration: Skin is pale.  Neurological:     Mental Status: She is alert.  Mental status is at baseline.  Psychiatric:        Mood and Affect: Mood normal.     CURRENT PROBLEM LIST:  Patient Active Problem List   Diagnosis Date Noted   Iron deficiency anemia 04/03/2021   Acute GI bleeding 11/10/2020   Rectal bleeding 11/09/2020   Acute encephalopathy 11/09/2020   Aortic atherosclerosis ([place]) 10/28/2020   Acute blood loss anemia 10/27/2020   AKI (acute kidney injury) ([place]) 10/27/2020   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   History of pulmonary embolism 10/27/2020   GI bleed 08/20/2020   Acute lower GI bleeding 08/19/2020   Acute pulmonary embolism ([place]) 07/21/2020   Hypoxia 05/23/2020   Acute hypoxemic respiratory failure ([place]) 05/22/2020   Rotator cuff arthropathy of left shoulder 12/13/2019   Pain in joint of right shoulder 12/10/2019   Pain in joint of left shoulder 12/10/2019   Hearing loss, sensorineural, 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   Tinnitus, left 01/08/2019   DM type 2, not at goal [facility]) 06/03/2018   Frequent infections 05/21/2018   Gangrene ([place]) 05/21/2018   Pain in finger 05/07/2018   Family history of aneurysm 05/07/2018   Diabetes mellitus due to underlying condition, controlled, with complication, without long-term current use of insulin ([place])    Lower urinary tract infectious disease    Palpitations 01/17/2018   Dehydration 01/17/2018   Hypotension 01/17/2018   Symptom of blood in stool 01/17/2018   Diarrhea in adult patient 01/17/2018   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   Aftercare following joint replacement 08/21/2017   Acquired absence of knee joint following  explantation of joint prosthesis with presence of antibiotic-impregnated cement spacer 06/05/2017   Closed displaced transverse fracture of right patella 05/06/2017   Anemia 04/16/2017   Chronic interstitial lung disease ([place]) 04/16/2017   Chronic kidney disease (CKD), stage III (moderate) ([place]) 04/16/2017   Other hyperlipidemia 04/16/2017   Type 2 diabetes mellitus without complication, with long-term current use of insulin ([place]) 04/16/2017   Pain in right knee 03/25/2017   Aftercare 03/25/2017   Arthritis, septic, knee ([place]) 03/09/2017   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   Fibromyalgia 06/04/2016   High risk medication use 06/04/2016   Claw toe, acquired, left 01/29/2016   Rheumatoid arthritis involving both feet with negative rheumatoid factor ([place]) 01/29/2016   Septic prepatellar bursitis of right knee 10/26/2014   Prepatellar bursitis of right knee 08/28/2014   DYSPHAGIA ORAL PHASE 06/18/2007   Asthma 05/14/2007   SLEEP APNEA 05/14/2007   Sleep apnea 05/14/2007   GERD 05/13/2007   Rheumatoid arthritis ([place]) 05/13/2007   OSTEOPOROSIS 05/13/2007   DIARRHEA, CHRONIC 05/13/2007    PAST MEDICAL HISTORY:  Active Ambulatory Problems    Diagnosis Date Noted   Asthma 05/14/2007   GERD 05/13/2007   Rheumatoid arthritis ([place]) 05/13/2007   OSTEOPOROSIS 05/13/2007   SLEEP APNEA 05/14/2007   DYSPHAGIA ORAL PHASE 06/18/2007   DIARRHEA, CHRONIC 05/13/2007   Prepatellar bursitis of right knee 08/28/2014   Septic prepatellar bursitis of right knee 10/26/2014   Arthritis, septic, knee ([place]) 03/09/2017   Palpitations 01/17/2018   Dehydration 01/17/2018   Hypotension 01/17/2018   Symptom of blood in stool 01/17/2018   Diarrhea in adult patient 01/17/2018   Diabetes mellitus due to underlying condition, controlled, with complication, without long-term current use of insulin ([place])    Lower urinary tract infectious disease    Pain in finger 05/07/2018   Family history of  aneurysm 05/07/2018   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  of joint prosthesis with presence of antibiotic-impregnated cement spacer 06/05/2017   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   Anemia 04/16/2017   Chronic interstitial lung disease ([place]) 04/16/2017   Chronic kidney disease (CKD), stage III (moderate) ([facility]) 04/16/2017   Claw toe, acquired, left 01/29/2016   Closed displaced transverse fracture of right patella 05/06/2017   DM type 2, not at goal [facility]) 06/03/2018   Fibromyalgia 06/04/2016   Frequent infections 05/21/2018   Gangrene ([place]) 05/21/2018   Hearing loss, sensorineural, 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   High risk medication use 06/04/2016   Other hyperlipidemia 04/16/2017   Pain in right knee 03/25/2017   Rheumatoid arthritis involving both feet with negative rheumatoid factor ([place]) 01/29/2016   Tinnitus, left 01/08/2019   Type 2 diabetes mellitus without complication, with long-term current use of insulin ([place]) 04/16/2017   Sleep apnea 05/14/2007   Aftercare following joint replacement 08/21/2017   Aftercare 03/25/2017   Pain in joint of right shoulder 12/10/2019   Pain in joint of left shoulder 12/10/2019   Rotator cuff arthropathy of left shoulder 12/13/2019   Acute hypoxemic respiratory failure ([place]) 05/22/2020   Hypoxia 05/23/2020   Acute pulmonary embolism ([place]) 07/21/2020   Acute lower GI bleeding 08/19/2020   GI bleed 08/20/2020   Acute blood loss anemia 10/27/2020   AKI (acute kidney injury) ([place]) 10/27/2020   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   History of pulmonary embolism 10/27/2020   Aortic atherosclerosis ([place]) 10/28/2020   Rectal bleeding 11/09/2020   Acute encephalopathy 11/09/2020   Acute GI bleeding 11/10/2020   Iron deficiency anemia 04/03/2021   Resolved Ambulatory Problems    Diagnosis Date Noted   No Resolved Ambulatory Problems   Past Medical History:  Diagnosis Date    Asthma    Complication of anesthesia    Diabetes mellitus without complication ([place])    Emphysema    Osteoporosis    Rheumatoid arthritis(714.0)     SOCIAL HX:  Social History   Tobacco Use   Smoking status: Former    Types: Cigarettes    Quit date: 03/11/1994    Years since quitting: 27.8   Smokeless tobacco: Never  Substance Use Topics   Alcohol use: No     ALLERGIES:  Allergies  Allergen Reactions   Penicillins Hives    Has patient had a PCN reaction causing immediate rash, facial/tongue/throat swelling, SOB or lightheadedness with hypotension: Yes Has patient had a PCN reaction causing severe rash involving mucus membranes or skin necrosis: No Has patient had a PCN reaction that required hospitalization: No Has patient had a PCN reaction occurring within the last 10 years: No If all of the above answers are "NO", then may proceed with Cephalosporin use. Patient has tolerated ceftriaxone in the recent past.   Sulfamethoxazole-Trimethoprim Nausea And Vomiting and Other (See Comments)    Stomach problems   Adalimumab Other (See Comments)    "Not effective"   Hydroxychloroquine Sulfate Other (See Comments)    "Plaquenil did not work after [employer]"   Meperidine Other (See Comments)    Cramping   Meperidine Hcl Other (See Comments)    Cramping    Methotrexate Nausea And Vomiting and Other (See Comments)    "Projectile vomiting"   Penicillin G Sodium Hives   Phentermine-Topiramate Nausea Only and Other (See Comments)    "Nausea and dizziness & on re-trial, mental side effects so really cannot take it   Sulfa Antibiotics Nausea And Vomiting and Other (See  Comments)    Stomach problems/GI upset, also   Sulfonamide Derivatives Nausea And Vomiting and Other (See Comments)    "Stomach problems"      PERTINENT MEDICATIONS:  MED ACETAMINOPHEN 500 MG TABLET TAKE 2 TABLETS (1,000MG) BY MOUTH EVERY 6 HOURS AS NEEDED FOR MILD PAIN OR HEADACHES Nyra Capes, [place] 12/28/2020  11:20 AM MED ATORVASTATIN 20 MG TABLET TAKE 1 TABLET BY MOUTH ONCE DAILY Other hyperlipidemia (E78.49) Nyra Capes, [place] 12/28/2020 11:20 AM MED EZETIMIBE 10 MG TABLET TAKE 1 TABLET BY MOUTH AT BEDTIME Other hyperlipidemia (E78.49) Nyra Capes, [place] 12/28/2020 11:20 AM MED IPRAT-ALBUT 0.5-3(2.5) MG/3 ML INHALE 1 VIAL VIA NEBULIZER BY MOUTH EVERY 6 HOURS AS NEEDED FOR WHEEZING Nyra Capes, [place] 12/28/2020 11:20 AM MED ONDANSETRON ODT 8 MG TABLET DISSOLVE 1 TABLET BY MOUTH EVERY 8 HOURS AS NEEDED FOR NAUSEA AND VOMITING Maryella Shivers 12/28/2020 11:20 AM MED Diarrhea (2 of 4): Initial Dose: Give Immodium AD 4 mg p.o. after 1st loose stool as needed. (Physician Order) Nyra Capes, [place] 03/18/2021 2:33 PM MED ASPERCREME LIDOCAINE 4% PATCH APPLY 1 PATCH TOPICALLY TO RIGHT SHOULDER EVERY DAY AS NEEDED - MILD/MODERATE PAIN *REMOVE PATCH WITHIN 12HRSNyra Capes, [place] 24/40/1027 2:53 PM MED FOLIC ACID 1 MG TABLET TAKE 1 TABLET BY MOUTH ONCE DAILY DeMarchi, William 04/21/2021 9:00 AM MED VITAMIN C 500 MG TABLET TAKE 1 TABLET BY MOUTH TWICE A DAY DeMarchi, William 04/20/2021 9:00 PM MED FEROSUL 325 MG TABLET TAKE 1 TABLET BY MOUTH TWICE A DAY DeMarchi, William 04/20/2021 8:00 PM MED METOPROLOL TARTRATE 25 MG TAB TAKE 1/2 TABLET (12.5MG) BY MOUTH TWICE A DAY DeMarchi, William 05/18/2021 2:00 PM MED AZELASTINE HCL 0.05% DROPS INSTILL 1 DROP IN BOTH EYES TWICE A DAY DeMarchi, William 05/25/2021 9:00 PM MED BUDESONIDE 0.25 MG/2 ML SUSP INHALE 1 VIAL VIA NEBULIZER BY MOUTH TWICE A DAY AS NEEDED *RINSE MOUTH AFTER USE* Chronic obstructive pulmonary disease, unspecified (J44.9) Nyra Capes, [place] 05/30/2021 1:14 PM MED LANTUS SOLOSTAR 100 UNIT/ML INJECT 8 UNITS SUBCUTANEOUSLY AT BEDTIME (PRIME PEN WITH 2 UNITS PRIOR TO EACH USE - USE WITHIN 28 DAYS ONCE OPENED) (USE SAFETY PEN NEEDLES) DeMarchi, William 05/30/2021 9:00 PM MED NORCO 10-325 TABLET. Take one tablet by mouth  TID. DeMarchi, Gwyndolyn Saxon 07/11/2021 12:00 PM MED DEEP SEA 0.65% NOSE SPRAY. One spray in each nostril TID prn for dry nose DeMarchi, Gwyndolyn Saxon 07/12/2021 2:27 PM MED PANTOPRAZOLE SOD DR 40 MG TAB TAKE 1 TABLET BY MOUTH ONCE DAILY (DO NOT CRUSH) Gastrointestinal hemorrhage, unspecified (K92.2) DeMarchi, William 09/07/2021 12:00 AM MED HYDROCORTISONE 0.5% CREAM APPLY TOPICALLY TO SIDES OF HIPS TWICE A DAY FOR CANDIDA DeMarchi, William 08/23/2021 8:00 AM MED MYRBETRIQ ER 25 MG TABLET TAKE 1 TABLET BY MOUTH ONCE DAILY (DO NOT CRUSH) DeMarchi, William 09/19/2021 7:00 AM MED DULOXETINE HCL DR 60 MG CAP TAKE 1 CAPSULE BY MOUTH TWICE A DAY (DO NOT CRUSH) Depression, unspecified (F32.A) DeMarchi, William 10/17/2021 4:48 PM MED METFORMIN HCL 850 MG TABLET TAKE 1 TABLET BY MOUTH TWICE A DAY 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 DeMarchi, Gwyndolyn Saxon 11/08/2021 2:38 PM MED PREDNISONE 5 MG TABLET; GIVE 1 TABLET BY MOUTH AT BEDTIME. DeMarchiGwyndolyn Saxon 12/18/2021 9:52 AM MED MECLIZINE 25 MG TABLET: Take one tablet po Q8H prn for vertigo DeMarchi, Gwyndolyn Saxon 01/02/2022 1:33 PM   Thank you for the opportunity to participate in the care of Ms. Klebba.  The palliative care  team will continue to follow. Please call our office at [phone] if we can be of additional assistance.   Hollace Kinnier, DO  COVID-19 PATIENT SCREENING TOOL Asked and negative response unless otherwise noted:  Have you had symptoms of covid, tested positive or been in contact with someone with symptoms/positive test in the past 5-10 days? no

## 2022-01-16 DIAGNOSIS — N3090 Cystitis, unspecified without hematuria: Secondary | ICD-10-CM | POA: Diagnosis not present

## 2022-01-16 DIAGNOSIS — J309 Allergic rhinitis, unspecified: Secondary | ICD-10-CM | POA: Diagnosis not present

## 2022-01-24 ENCOUNTER — Telehealth: Payer: Self-pay

## 2022-01-24 ENCOUNTER — Ambulatory Visit (INDEPENDENT_AMBULATORY_CARE_PROVIDER_SITE_OTHER): Payer: Medicare HMO | Admitting: Psychologist

## 2022-01-24 DIAGNOSIS — F330 Major depressive disorder, recurrent, mild: Secondary | ICD-10-CM | POA: Diagnosis not present

## 2022-01-24 NOTE — Progress Notes (Signed)
                Ayvah Caroll, PsyD 

## 2022-01-24 NOTE — Telephone Encounter (Signed)
3 pm.  Message received from patient requesting a call back.  Return call made and spoke with patient.  She is wanting to make sure she remains under PC as recommended by her rheumatologist this week.  Advised patient is still under PC and will have a new provider following up in the next month.  No other concerns voiced by patient at this time.

## 2022-01-24 NOTE — Progress Notes (Signed)
[facility] Counselor/Therapist Progress Note  Patient ID: [name], MRN: [id number],    Date: 01/24/2022  Time Spent: 10:03 am to 10:43 am; total time: 40 minutes   This session was held via phone teletherapy due to the coronavirus risk at this time. The patient consented to phone teletherapy and was located at her home during this session. She is aware it is the responsibility of the patient to secure confidentiality on her end of the session. The provider was in a private home office for the duration of this session. Limits of confidentiality were discussed with the patient.   Treatment Type: Individual Therapy  Reported Symptoms: Stress related to family not assisting in her caregiving  Mental Status Exam: Appearance:  NA     Behavior: Appropriate  Motor: Normal  Speech/Language:  Clear and Coherent  Affect: Appropriate  Mood: normal  Thought process: normal  Thought content:   WNL  Sensory/Perceptual disturbances:   WNL  Orientation: oriented to person, place, and time/date  Attention: Good  Concentration: Good  Memory: WNL  Fund of knowledge:  Good  Insight:   Fair  Judgment:  Fair  Impulse Control: Good   Risk Assessment: Danger to Self:  No Self-injurious Behavior: No Danger to Others: No Duty to Warn:no Physical Aggression / Violence:No  Access to Firearms a concern: No  Gang Involvement:No   Subjective: Beginning the session, patient described herself as okay. After reviewing the treatment plan, patient talked about how she is giving back to the facility and how that is important to her. From there, she spent time voicing some frustration with her children and their unwillingness to assist in her care giving. Patient reports that PT stated that she can go home, but she needs someone at home. She initially stated that no one would be able to be present and then later stated that her grandson and his family already reside there. She processed ways  to go about and get a resolution to the problem. She was agreeable to the plan discussed.  She denied suicidal and homicidal ideation.   Interventions:  Worked on developing a therapeutic relationship with the patient using active listening and reflective statements. Provided emotional support using empathy and validation. Normalized and validated expressed thoughts and emotions. Reviewed the treatment plan with the patient. Reviewed events since the intake. Praised the patient for giving back and explored other ways to give back. Used socratic questions to assist the patient gain insight into the facility where she resides. Explored frustrations with family members. Processed the idea of having a family meeting between family and facility staff. Challenged some of the thoughts expressed by the patient. Reflected on some of the inconsistencies in patient presenting information.Used MI to roll with the resistance. Provided empathic statements. Assigned homework. Assessed for suicidal and homicidal ideation.   Homework See if she can arrange a family meeting between family and staff facility; explore the idea of sitting on a committee for the facility  Next Session: Review homework and emotional support  Diagnosis: F33.0 major depressive affective disorder, recurrent, mild  Plan:  Goals Alleviate depressive symptoms Recognize, accept, and cope with depressive feelings Develop healthy thinking patterns Develop healthy interpersonal relationships  Objectives target date for all objectives is 12/28/2022 Cooperate with a medication evaluation by a physician Verbalize an accurate understanding of depression Verbalize an understanding of the treatment Identify and replace thoughts that support depression Learn and implement behavioral strategies Verbalize an understanding and resolution of current interpersonal  problems Learn and implement problem solving and decision making skills Learn and  implement conflict resolution skills to resolve interpersonal problems Verbalize an understanding of healthy and unhealthy emotions verbalize insight into how past relationships may be influence current experiences with depression Use mindfulness and acceptance strategies and increase value based behavior  Increase hopeful statements about the future.  Interventions Consistent with treatment model, discuss how change in cognitive, behavioral, and interpersonal can help client alleviate depression CBT Behavioral activation help the client explore the relationship, nature of the dispute,  Help the client develop new interpersonal skills and relationships Conduct Problem so living therapy Teach conflict resolution skills Use a process-experiential approach Conduct TLDP Conduct ACT Evaluate need for psychotropic medication Monitor adherence to medication   The patient and clinician reviewed the treatment plan on 01/24/2022. The patient approved of the treatment plan.    Conception Chancy, PsyD

## 2022-02-14 ENCOUNTER — Ambulatory Visit: Payer: PRIVATE HEALTH INSURANCE | Admitting: Psychologist

## 2022-02-18 ENCOUNTER — Other Ambulatory Visit: Payer: Self-pay

## 2022-02-18 ENCOUNTER — Inpatient Hospital Stay: Payer: Medicare PPO | Attending: Physician Assistant

## 2022-02-18 ENCOUNTER — Inpatient Hospital Stay: Payer: Medicare PPO | Admitting: Hematology and Oncology

## 2022-02-18 DIAGNOSIS — D500 Iron deficiency anemia secondary to blood loss (chronic): Secondary | ICD-10-CM

## 2022-02-27 ENCOUNTER — Non-Acute Institutional Stay: Payer: Medicare Other | Admitting: Hospice

## 2022-02-27 DIAGNOSIS — M069 Rheumatoid arthritis, unspecified: Secondary | ICD-10-CM

## 2022-02-27 DIAGNOSIS — R0602 Shortness of breath: Secondary | ICD-10-CM

## 2022-02-27 DIAGNOSIS — Z515 Encounter for palliative care: Secondary | ICD-10-CM

## 2022-02-27 NOTE — Progress Notes (Signed)
[place] Follow-Up Visit Telephone: [phone]  Fax: [phone]   Date of encounter: 02/27/22 11:05 AM PATIENT NAME: [name] [address] [place] [address]   [phone] (home)  DOB: Sep 30, 1940 MRN: [id number] PRIMARY CARE PROVIDER:    Pcp, No,  No address on file None  REFERRING PROVIDER:   Dr. Sherril Croon  RESPONSIBLE PARTY:    Contact Information     Name Relation Home Work [place] Son   [phone]   Dravitz,Pam Daughter   [phone]        I met face to face with patient and family in [facility]. Palliative Care was asked to follow this patient by consultation request of  Dr. Sherril Croon to address advance care planning and complex medical decision making. This is follow-up visit.                                     ASSESSMENT AND PLAN / RECOMMENDATIONS:   Advance Care Planning/Goals of Care: Goals include to maximize quality of life and symptom management. Patient/health care surrogate gave his/her permission to discuss.Our advance care planning conversation included a discussion about:    The value and importance of advance care planning  Experiences with loved ones who have been seriously ill or have died  Exploration of personal, cultural or spiritual beliefs that might influence medical decisions  Exploration of goals of care in the event of a sudden injury or illness  Identification  of a healthcare agent  Review and updating or creation of an  advance directive document . Decision not to resuscitate or to de-escalate disease focused treatments due to poor prognosis. CODE STATUS:  DNR  Symptom Management/Plan: Rheumatoid arthritis: chronic, continue Prednisone as ordered.  Shortness of breath: related to COPD. Continue  Arformoterol as ordered. Avoid triggers. Encourage slow deep breathing. Oxygen supplementation 2L/Min prn SOB.  Follow up Palliative Care Visit: Palliative care  will continue to follow for complex medical decision making, advance care planning, and clarification of goals. Return 4 weeks or prn.  PPS: 40%  HOSPICE ELIGIBILITY/DIAGNOSIS: TBD  Chief Complaint: Follow-up palliative visit  HISTORY OF PRESENT ILLNESS:  [name] is a 81 y.o. year old female  with RA, chronic interstitial lung disease, h/o PE, dysphagia, DMII, osteoporosis, right knee bursitis, CKD3, anemia, fibromyalgia, hyperlipidemia, prior GI bleed, among others seen in palliative f/u.  History obtained from review of EMR, discussion with primary team, and interview with family, facility staff/caregiver and/or [name].  I reviewed available labs, medications, imaging, studies and related documents from the EMR.  Records reviewed and summarized above.    There is no height or weight on file to calculate BMI. Wt Readings from Last 500 Encounters:  01/09/22 152 lb (68.9 kg)  12/31/21 134 lb 9.6 oz (61.1 kg)  11/27/21 151 lb 8 oz (68.7 kg)  07/10/21 143 lb 14.4 oz (65.3 kg)  06/14/21 147 lb (66.7 kg)  05/08/21 137 lb (62.1 kg)  05/01/21 137 lb (62.1 kg)  04/24/21 138 lb (62.6 kg)  04/18/21 138 lb (62.6 kg)  04/10/21 133 lb (60.3 kg)  11/15/20 154 lb 5.2 oz (70 kg)  10/28/20 147 lb 14.9 oz (67.1 kg)  09/14/20 161 lb 3.2 oz (73.1 kg)  08/31/20 161 lb 3.2 oz (73.1 kg)  08/22/20 182 lb 12.2 oz (82.9 kg)  07/22/20 173 lb 1  oz (78.5 kg)  05/31/20 189 lb 6 oz (85.9 kg)  05/26/20 189 lb 6 oz (85.9 kg)  10/28/18 170 lb (77.1 kg)  10/21/18 170 lb (77.1 kg)  05/14/18 182 lb 1.6 oz (82.6 kg)  05/07/18 187 lb (84.8 kg)  01/19/18 187 lb (84.8 kg)  04/08/17 161 lb (73 kg)  03/14/17 173 lb 8 oz (78.7 kg)  03/09/17 173 lb 8 oz (78.7 kg)  01/15/17 177 lb (80.3 kg)  12/24/16 182 lb 9.6 oz (82.8 kg)  09/13/16 195 lb (88.5 kg)  03/18/16 197 lb (89.4 kg)  08/30/15 200 lb 6.4 oz (90.9 kg)  05/22/15 180 lb (81.6 kg)  01/05/15 185 lb (83.9 kg)  10/26/14 189 lb 6.4 oz (85.9 kg)  09/16/14  188 lb (85.3 kg)  07/25/14 194 lb (88 kg)  12/20/13 190 lb (86.2 kg)  12/06/13 190 lb (86.2 kg)  07/09/13 204 lb (92.5 kg)  06/24/13 200 lb (90.7 kg)  01/06/13 198 lb (89.8 kg)  12/09/12 198 lb (89.8 kg)  06/25/12 195 lb (88.5 kg)  06/11/12 195 lb (88.5 kg)  08/22/11 185 lb (83.9 kg)  08/08/11 185 lb (83.9 kg)  03/21/11 185 lb (83.9 kg)  03/07/11 185 lb (83.9 kg)    CURRENT PROBLEM LIST:  Patient Active Problem List   Diagnosis Date Noted   Iron deficiency anemia 04/03/2021   Acute GI bleeding 11/10/2020   Rectal bleeding 11/09/2020   Acute encephalopathy 11/09/2020   Aortic atherosclerosis ([place]) 10/28/2020   Acute blood loss anemia 10/27/2020   AKI (acute kidney injury) ([place]) 10/27/2020   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   History of pulmonary embolism 10/27/2020   GI bleed 08/20/2020   Acute lower GI bleeding 08/19/2020   Acute pulmonary embolism ([place]) 07/21/2020   Hypoxia 05/23/2020   Acute hypoxemic respiratory failure ([place]) 05/22/2020   Rotator cuff arthropathy of left shoulder 12/13/2019   Pain in joint of right shoulder 12/10/2019   Pain in joint of left shoulder 12/10/2019   Hearing loss, sensorineural, 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   Tinnitus, left 01/08/2019   DM type 2, not at goal [facility]) 06/03/2018   Frequent infections 05/21/2018   Gangrene ([place]) 05/21/2018   Pain in finger 05/07/2018   Family history of aneurysm 05/07/2018   Diabetes mellitus due to underlying condition, controlled, with complication, without long-term current use of insulin ([place])    Lower urinary tract infectious disease    Palpitations 01/17/2018   Dehydration 01/17/2018   Hypotension 01/17/2018   Symptom of blood in stool 01/17/2018   Diarrhea in adult patient 01/17/2018   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   Aftercare following joint replacement 08/21/2017   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 with presence of antibiotic-impregnated cement  spacer 06/05/2017   Closed displaced transverse fracture of right patella 05/06/2017   Anemia 04/16/2017   Chronic interstitial lung disease ([place]) 04/16/2017   Chronic kidney disease (CKD), stage III (moderate) ([place]) 04/16/2017   Other hyperlipidemia 04/16/2017   Type 2 diabetes mellitus without complication, with long-term current use of insulin ([place]) 04/16/2017   Pain in right knee 03/25/2017   Aftercare 03/25/2017   Arthritis, septic, knee ([place]) 03/09/2017   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   Fibromyalgia 06/04/2016   High risk medication use 06/04/2016   Claw toe, acquired, left 01/29/2016   Rheumatoid arthritis involving both feet with negative rheumatoid factor ([place]) 01/29/2016   Septic prepatellar bursitis of right knee 10/26/2014   Prepatellar bursitis of right  knee 08/28/2014   DYSPHAGIA ORAL PHASE 06/18/2007   Asthma 05/14/2007   SLEEP APNEA 05/14/2007   Sleep apnea 05/14/2007   GERD 05/13/2007   Rheumatoid arthritis ([place]) 05/13/2007   OSTEOPOROSIS 05/13/2007   DIARRHEA, CHRONIC 05/13/2007    PAST MEDICAL HISTORY:  Active Ambulatory Problems    Diagnosis Date Noted   Asthma 05/14/2007   GERD 05/13/2007   Rheumatoid arthritis ([place]) 05/13/2007   OSTEOPOROSIS 05/13/2007   SLEEP APNEA 05/14/2007   DYSPHAGIA ORAL PHASE 06/18/2007   DIARRHEA, CHRONIC 05/13/2007   Prepatellar bursitis of right knee 08/28/2014   Septic prepatellar bursitis of right knee 10/26/2014   Arthritis, septic, knee ([place]) 03/09/2017   Palpitations 01/17/2018   Dehydration 01/17/2018   Hypotension 01/17/2018   Symptom of blood in stool 01/17/2018   Diarrhea in adult patient 01/17/2018   Diabetes mellitus due to underlying condition, controlled, with complication, without long-term current use of insulin ([place])    Lower urinary tract infectious disease    Pain in finger 05/07/2018   Family history of aneurysm 05/07/2018   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  joint prosthesis with presence of antibiotic-impregnated cement spacer 06/05/2017   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   Anemia 04/16/2017   Chronic interstitial lung disease ([place]) 04/16/2017   Chronic kidney disease (CKD), stage III (moderate) ([place]) 04/16/2017   Claw toe, acquired, left 01/29/2016   Closed displaced transverse fracture of right patella 05/06/2017   DM type 2, not at goal [facility]) 06/03/2018   Fibromyalgia 06/04/2016   Frequent infections 05/21/2018   Gangrene ([place]) 05/21/2018   Hearing loss, sensorineural, 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   High risk medication use 06/04/2016   Other hyperlipidemia 04/16/2017   Pain in right knee 03/25/2017   Rheumatoid arthritis involving both feet with negative rheumatoid factor ([place]) 01/29/2016   Tinnitus, left 01/08/2019   Type 2 diabetes mellitus without complication, with long-term current use of insulin ([place]) 04/16/2017   Sleep apnea 05/14/2007   Aftercare following joint replacement 08/21/2017   Aftercare 03/25/2017   Pain in joint of right shoulder 12/10/2019   Pain in joint of left shoulder 12/10/2019   Rotator cuff arthropathy of left shoulder 12/13/2019   Acute hypoxemic respiratory failure ([place]) 05/22/2020   Hypoxia 05/23/2020   Acute pulmonary embolism ([place]) 07/21/2020   Acute lower GI bleeding 08/19/2020   GI bleed 08/20/2020   Acute blood loss anemia 10/27/2020   AKI (acute kidney injury) ([place]) 10/27/2020   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   History of pulmonary embolism 10/27/2020   Aortic atherosclerosis ([place]) 10/28/2020   Rectal bleeding 11/09/2020   Acute encephalopathy 11/09/2020   Acute GI bleeding 11/10/2020   Iron deficiency anemia 04/03/2021   Resolved Ambulatory Problems    Diagnosis Date Noted   No Resolved Ambulatory Problems   Past Medical History:  Diagnosis Date   Asthma    Complication of anesthesia    Diabetes mellitus without  complication ([place])    Emphysema    Osteoporosis    Rheumatoid arthritis(714.0)     SOCIAL HX:  Social History   Tobacco Use   Smoking status: Former    Types: Cigarettes    Quit date: 03/11/1994    Years since quitting: 27.9   Smokeless tobacco: Never  Substance Use Topics   Alcohol use: No     ALLERGIES:  Allergies  Allergen Reactions   Penicillins Hives    Has patient had a PCN reaction causing  immediate rash, facial/tongue/throat swelling, SOB or lightheadedness with hypotension: Yes  Has patient had a PCN reaction causing severe rash involving mucus membranes or skin necrosis: No  Has patient had a PCN reaction that required hospitalization: No  Has patient had a PCN reaction occurring within the last 10 years: No  If all of the above answers are "NO", then may proceed with Cephalosporin use.  Patient has tolerated ceftriaxone in the recent past.  Other Reaction: Allergy    Has patient had a PCN reaction causing immediate rash, facial/tongue/throat swelling, SOB or lightheadedness with hypotension: Yes    Has patient had a PCN reaction causing severe rash involving mucus membranes or skin necrosis: No    Has patient had a PCN reaction that required hospitalization: No    Has patient had a PCN reaction occurring within the last 10 years: No    If all of the above answers are "NO", then may proceed with Cephalosporin use.    Patient has tolerated ceftriaxone in the recent past.   Sulfamethoxazole-Trimethoprim Nausea And Vomiting and Other (See Comments)    Stomach problems   Adalimumab Other (See Comments)    "Not effective"   Hydroxychloroquine Sulfate Other (See Comments)    "Plaquenil did not work after [employer]"   Meperidine Other (See Comments)    Cramping   Meperidine Hcl Other (See Comments)    Cramping    Methotrexate Nausea And Vomiting and Other (See Comments)    "Projectile vomiting"   Penicillin G Sodium Hives   Phentermine-Topiramate Nausea  Only and Other (See Comments)    "Nausea and dizziness & on re-trial, mental side effects so really cannot take it   Sulfa Antibiotics Nausea And Vomiting and Other (See Comments)    Stomach problems/GI upset, also   Sulfonamide Derivatives Nausea And Vomiting and Other (See Comments)    "Stomach problems"      PERTINENT MEDICATIONS:  MED ACETAMINOPHEN 500 MG TABLET TAKE 2 TABLETS (1,000MG) BY MOUTH EVERY 6 HOURS AS NEEDED FOR MILD PAIN OR HEADACHES Nyra Capes, [place] 12/28/2020 11:20 AM MED ATORVASTATIN 20 MG TABLET TAKE 1 TABLET BY MOUTH ONCE DAILY Other hyperlipidemia (E78.49) Nyra Capes, [place] 12/28/2020 11:20 AM MED EZETIMIBE 10 MG TABLET TAKE 1 TABLET BY MOUTH AT BEDTIME Other hyperlipidemia (E78.49) Nyra Capes, [place] 12/28/2020 11:20 AM MED IPRAT-ALBUT 0.5-3(2.5) MG/3 ML INHALE 1 VIAL VIA NEBULIZER BY MOUTH EVERY 6 HOURS AS NEEDED FOR WHEEZING Nyra Capes, [place] 12/28/2020 11:20 AM MED ONDANSETRON ODT 8 MG TABLET DISSOLVE 1 TABLET BY MOUTH EVERY 8 HOURS AS NEEDED FOR NAUSEA AND VOMITING Maryella Shivers 12/28/2020 11:20 AM MED Diarrhea (2 of 4): Initial Dose: Give Immodium AD 4 mg p.o. after 1st loose stool as needed. (Physician Order) Nyra Capes, [place] 03/18/2021 2:33 PM MED ASPERCREME LIDOCAINE 4% PATCH APPLY 1 PATCH TOPICALLY TO RIGHT SHOULDER EVERY DAY AS NEEDED - MILD/MODERATE PAIN *REMOVE PATCH WITHIN 12HRSNyra Capes, [place] 62/22/9798 9:21 PM MED FOLIC ACID 1 MG TABLET TAKE 1 TABLET BY MOUTH ONCE DAILY DeMarchi, [place] 04/21/2021 9:00 AM MED VITAMIN C 500 MG TABLET TAKE 1 TABLET BY MOUTH TWICE A DAY DeMarchi, William 04/20/2021 9:00 PM MED FEROSUL 325 MG TABLET TAKE 1 TABLET BY MOUTH TWICE A DAY DeMarchi, William 04/20/2021 8:00 PM MED METOPROLOL TARTRATE 25 MG TAB TAKE 1/2 TABLET (12.5MG) BY MOUTH TWICE A DAY DeMarchi, William 05/18/2021 2:00 PM MED AZELASTINE HCL 0.05% DROPS INSTILL 1 DROP IN BOTH EYES TWICE A DAY DeMarchi, William 05/25/2021  9:00 PM MED BUDESONIDE 0.25 MG/2 ML SUSP  INHALE 1 VIAL VIA NEBULIZER BY MOUTH TWICE A DAY AS NEEDED *RINSE MOUTH AFTER USE* Chronic obstructive pulmonary disease, unspecified (J44.9) Nyra Capes, [place] 05/30/2021 1:14 PM MED LANTUS SOLOSTAR 100 UNIT/ML INJECT 8 UNITS SUBCUTANEOUSLY AT BEDTIME (PRIME PEN WITH 2 UNITS PRIOR TO EACH USE - USE WITHIN 28 DAYS ONCE OPENED) (USE SAFETY PEN NEEDLES) DeMarchi, William 05/30/2021 9:00 PM MED NORCO 10-325 TABLET. Take one tablet by mouth TID. DeMarchi, Gwyndolyn Saxon 07/11/2021 12:00 PM MED DEEP SEA 0.65% NOSE SPRAY. One spray in each nostril TID prn for dry nose DeMarchi, Gwyndolyn Saxon 07/12/2021 2:27 PM MED PANTOPRAZOLE SOD DR 40 MG TAB TAKE 1 TABLET BY MOUTH ONCE DAILY (DO NOT CRUSH) Gastrointestinal hemorrhage, unspecified (K92.2) DeMarchi, William 09/07/2021 12:00 AM MED HYDROCORTISONE 0.5% CREAM APPLY TOPICALLY TO SIDES OF HIPS TWICE A DAY FOR CANDIDA DeMarchi, William 08/23/2021 8:00 AM MED MYRBETRIQ ER 25 MG TABLET TAKE 1 TABLET BY MOUTH ONCE DAILY (DO NOT CRUSH) DeMarchi, William 09/19/2021 7:00 AM MED DULOXETINE HCL DR 60 MG CAP TAKE 1 CAPSULE BY MOUTH TWICE A DAY (DO NOT CRUSH) Depression, unspecified (F32.A) DeMarchi, William 10/17/2021 4:48 PM MED METFORMIN HCL 850 MG TABLET TAKE 1 TABLET BY MOUTH TWICE A DAY 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 DeMarchi, Gwyndolyn Saxon 11/08/2021 2:38 PM MED PREDNISONE 5 MG TABLET; GIVE 1 TABLET BY MOUTH AT BEDTIME. DeMarchiGwyndolyn Saxon 12/18/2021 9:52 AM MED MECLIZINE 25 MG TABLET: Take one tablet po Q8H prn for vertigo DeMarchi, Gwyndolyn Saxon 01/02/2022 1:33 PM I spent  35 minutes providing this consultation; this includes time spent with patient/family, chart review and documentation. More than 50% of the time in this consultation was spent on  counseling and coordinating communication   Thank you for the opportunity to participate in the care of Ms. [place].  The palliative care team will continue to follow. Please call our office at [phone] if we can be of additional assistance.   Teodoro Spray, NP

## 2022-04-02 ENCOUNTER — Non-Acute Institutional Stay: Payer: Medicare Other | Admitting: Hospice

## 2022-04-02 DIAGNOSIS — Z515 Encounter for palliative care: Secondary | ICD-10-CM

## 2022-04-02 DIAGNOSIS — M069 Rheumatoid arthritis, unspecified: Secondary | ICD-10-CM

## 2022-04-02 DIAGNOSIS — F339 Major depressive disorder, recurrent, unspecified: Secondary | ICD-10-CM

## 2022-04-02 DIAGNOSIS — R0602 Shortness of breath: Secondary | ICD-10-CM

## 2022-04-02 NOTE — Progress Notes (Signed)
[facility] Follow-Up Visit Telephone: [phone]  Fax: [phone]   Date of encounter: 04/02/22 4:03 PM PATIENT NAME: [name] [address] [place] [address]   [phone] (home)  DOB: 1940/11/13 MRN: [id number] PRIMARY CARE PROVIDER:    Dr Madison Hickman  REFERRING PROVIDER:   Dr. Sherril Croon  RESPONSIBLE PARTY:    Contact Information     Name Relation Home Work [place] Son   [phone]   Dravitz,Pam Daughter   [phone]        I met face to face with patient and family in [facility]. Palliative Care was asked to follow this patient by consultation request of  Dr. Sherril Croon to address advance care planning and complex medical decision making. This is follow-up visit.                                     ASSESSMENT AND PLAN / RECOMMENDATIONS:   CODE STATUS:  DNR  Symptom Management/Plan: Rheumatoid arthritis: chronic, continue prednisone as ordered.  Follow-up with rheumatologist as planned. Shortness of breath: related to COPD. Continue  Arformoterol as ordered. Avoid triggers. Encourage slow deep breathing. Oxygen supplementation 2L/Min prn SOB.  Depression: Managed with duloxetine.  Encourage socialization and participation in facility activities.  Routine CBC CMP. Follow up Palliative Care Visit: Palliative care will continue to follow for complex medical decision making, advance care planning, and clarification of goals. Return 4 weeks or prn.   HOSPICE ELIGIBILITY/DIAGNOSIS: TBD  Chief Complaint: Follow-up palliative visit  HISTORY OF PRESENT ILLNESS:  [name] is a 82 y.o. year old female  with multiple morbidities requiring close monitoring, with high risk of complications and mortality: RA, chronic interstitial lung disease, h/o PE, dysphagia, DMII, osteoporosis, right knee bursitis, CKD3, anemia, fibromyalgia, hyperlipidemia, prior GI bleed, among others seen in palliative f/u.   Patient in no acute distress, was resting in bed during visit, denied pain/discomfort. History obtained from review of EMR, discussion with primary team, and interview with family, facility staff/caregiver and/or [name].  I reviewed available labs, medications, imaging, studies and related documents from the EMR.  Records reviewed and summarized above.    There is no height or weight on file to calculate BMI. Wt Readings from Last 500 Encounters:  01/09/22 152 lb (68.9 kg)  12/31/21 134 lb 9.6 oz (61.1 kg)  11/27/21 151 lb 8 oz (68.7 kg)  07/10/21 143 lb 14.4 oz (65.3 kg)  06/14/21 147 lb (66.7 kg)  05/08/21 137 lb (62.1 kg)  05/01/21 137 lb (62.1 kg)  04/24/21 138 lb (62.6 kg)  04/18/21 138 lb (62.6 kg)  04/10/21 133 lb (60.3 kg)  11/15/20 154 lb 5.2 oz (70 kg)  10/28/20 147 lb 14.9 oz (67.1 kg)  09/14/20 161 lb 3.2 oz (73.1 kg)  08/31/20 161 lb 3.2 oz (73.1 kg)  08/22/20 182 lb 12.2 oz (82.9 kg)  07/22/20 173 lb 1 oz (78.5 kg)  05/31/20 189 lb 6 oz (85.9 kg)  05/26/20 189 lb 6 oz (85.9 kg)  10/28/18 170 lb (77.1 kg)  10/21/18 170 lb (77.1 kg)  05/14/18 182 lb 1.6 oz (82.6 kg)  05/07/18 187 lb (84.8 kg)  01/19/18 187 lb (84.8 kg)  04/08/17 161 lb (73 kg)  03/14/17 173 lb 8 oz (78.7 kg)  03/09/17 173 lb 8 oz (78.7 kg)  01/15/17 177 lb (80.3  kg)  12/24/16 182 lb 9.6 oz (82.8 kg)  09/13/16 195 lb (88.5 kg)  03/18/16 197 lb (89.4 kg)  08/30/15 200 lb 6.4 oz (90.9 kg)  05/22/15 180 lb (81.6 kg)  01/05/15 185 lb (83.9 kg)  10/26/14 189 lb 6.4 oz (85.9 kg)  09/16/14 188 lb (85.3 kg)  07/25/14 194 lb (88 kg)  12/20/13 190 lb (86.2 kg)  12/06/13 190 lb (86.2 kg)  07/09/13 204 lb (92.5 kg)  06/24/13 200 lb (90.7 kg)  01/06/13 198 lb (89.8 kg)  12/09/12 198 lb (89.8 kg)  06/25/12 195 lb (88.5 kg)  06/11/12 195 lb (88.5 kg)  08/22/11 185 lb (83.9 kg)  08/08/11 185 lb (83.9 kg)  03/21/11 185 lb (83.9 kg)  03/07/11 185 lb (83.9 kg)    CURRENT PROBLEM LIST:  Patient  Active Problem List   Diagnosis Date Noted   Iron deficiency anemia 04/03/2021   Acute GI bleeding 11/10/2020   Rectal bleeding 11/09/2020   Acute encephalopathy 11/09/2020   Aortic atherosclerosis ([place]) 10/28/2020   Acute blood loss anemia 10/27/2020   AKI (acute kidney injury) ([place]) 10/27/2020   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   History of pulmonary embolism 10/27/2020   GI bleed 08/20/2020   Acute lower GI bleeding 08/19/2020   Acute pulmonary embolism ([place]) 07/21/2020   Hypoxia 05/23/2020   Acute hypoxemic respiratory failure ([place]) 05/22/2020   Rotator cuff arthropathy of left shoulder 12/13/2019   Pain in joint of right shoulder 12/10/2019   Pain in joint of left shoulder 12/10/2019   Hearing loss, sensorineural, 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   Tinnitus, left 01/08/2019   DM type 2, not at goal [facility]) 06/03/2018   Frequent infections 05/21/2018   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/21/2018   Pain in finger 05/07/2018   Family history of aneurysm 05/07/2018   Diabetes mellitus due to underlying condition, controlled, with complication, without long-term current use of insulin ([place])    Lower urinary tract infectious disease    Palpitations 01/17/2018   Dehydration 01/17/2018   Hypotension 01/17/2018   Symptom of blood in stool 01/17/2018   Diarrhea in adult patient 01/17/2018   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   Aftercare following joint replacement 08/21/2017   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 with presence of antibiotic-impregnated cement spacer 06/05/2017   Closed displaced transverse fracture of right patella 05/06/2017   Anemia 04/16/2017   Chronic interstitial lung disease ([place]) 04/16/2017   Chronic kidney disease (CKD), stage III (moderate) ([place]) 04/16/2017   Other hyperlipidemia 04/16/2017   Type 2 diabetes mellitus without complication, with long-term current use of insulin ([place]) 04/16/2017   Pain in right knee 03/25/2017   Aftercare  03/25/2017   Arthritis, septic, knee ([place]) 03/09/2017   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   Fibromyalgia 06/04/2016   High risk medication use 06/04/2016   Claw toe, acquired, left 01/29/2016   Rheumatoid arthritis involving both feet with negative rheumatoid factor ([place]) 01/29/2016   Septic prepatellar bursitis of right knee 10/26/2014   Prepatellar bursitis of right knee 08/28/2014   DYSPHAGIA ORAL PHASE 06/18/2007   Asthma 05/14/2007   SLEEP APNEA 05/14/2007   Sleep apnea 05/14/2007   GERD 05/13/2007   Rheumatoid arthritis ([place]) 05/13/2007   OSTEOPOROSIS 05/13/2007   DIARRHEA, CHRONIC 05/13/2007    PAST MEDICAL HISTORY:  Active Ambulatory Problems    Diagnosis Date Noted   Asthma 05/14/2007   GERD 05/13/2007   Rheumatoid arthritis ([place]) 05/13/2007   OSTEOPOROSIS 05/13/2007   SLEEP  APNEA 05/14/2007   DYSPHAGIA ORAL PHASE 06/18/2007   DIARRHEA, CHRONIC 05/13/2007   Prepatellar bursitis of right knee 08/28/2014   Septic prepatellar bursitis of right knee 10/26/2014   Arthritis, septic, knee ([place]) 03/09/2017   Palpitations 01/17/2018   Dehydration 01/17/2018   Hypotension 01/17/2018   Symptom of blood in stool 01/17/2018   Diarrhea in adult patient 01/17/2018   Diabetes mellitus due to underlying condition, controlled, with complication, without long-term current use of insulin ([place])    Lower urinary tract infectious disease    Pain in finger 05/07/2018   Family history of aneurysm 05/07/2018   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 with presence of antibiotic-impregnated cement spacer 06/05/2017   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   Anemia 04/16/2017   Chronic interstitial lung disease ([place]) 04/16/2017   Chronic kidney disease (CKD), stage III (moderate) ([name]) 04/16/2017   Claw toe, acquired, left 01/29/2016   Closed displaced transverse fracture of right  patella 05/06/2017   DM type 2, not at goal [facility]) 06/03/2018   Fibromyalgia 06/04/2016   Frequent infections 05/21/2018   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   Hearing loss, sensorineural, 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   High risk medication use 06/04/2016   Other hyperlipidemia 04/16/2017   Pain in right knee 03/25/2017   Rheumatoid arthritis involving both feet with negative rheumatoid factor ([place]) 01/29/2016   Tinnitus, left 01/08/2019   Type 2 diabetes mellitus without complication, with long-term current use of insulin ([place]) 04/16/2017   Sleep apnea 05/14/2007   Aftercare following joint replacement 08/21/2017   Aftercare 03/25/2017   Pain in joint of right shoulder 12/10/2019   Pain in joint of left shoulder 12/10/2019   Rotator cuff arthropathy of left shoulder 12/13/2019   Acute hypoxemic respiratory failure ([place]) 05/22/2020   Hypoxia 05/23/2020   Acute pulmonary embolism ([place]) 07/21/2020   Acute lower GI bleeding 08/19/2020   GI bleed 08/20/2020   Acute blood loss anemia 10/27/2020   AKI (acute kidney injury) ([place]) 10/27/2020   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   History of pulmonary embolism 10/27/2020   Aortic atherosclerosis ([place]) 10/28/2020   Rectal bleeding 11/09/2020   Acute encephalopathy 11/09/2020   Acute GI bleeding 11/10/2020   Iron deficiency anemia 04/03/2021   Resolved Ambulatory Problems    Diagnosis Date Noted   No Resolved Ambulatory Problems   Past Medical History:  Diagnosis Date   Asthma    Complication of anesthesia    Diabetes mellitus without complication ([place])    Emphysema    Osteoporosis    Rheumatoid arthritis(714.0)     SOCIAL HX:  Social History   Tobacco Use   Smoking status: Former    Types: Cigarettes    Quit date: 03/11/1994    Years since quitting: 28.0   Smokeless tobacco: Never  Substance Use Topics   Alcohol use: No     ALLERGIES:  Allergies  Allergen Reactions   Penicillins Hives    Has patient had a PCN reaction causing immediate  rash, facial/tongue/throat swelling, SOB or lightheadedness with hypotension: Yes  Has patient had a PCN reaction causing severe rash involving mucus membranes or skin necrosis: No  Has patient had a PCN reaction that required hospitalization: No  Has patient had a PCN reaction occurring within the last 10 years: No  If all of the above answers are "NO", then may proceed with Cephalosporin use.  Patient has tolerated ceftriaxone in the recent past.  Other  Reaction: Allergy    Has patient had a PCN reaction causing immediate rash, facial/tongue/throat swelling, SOB or lightheadedness with hypotension: Yes    Has patient had a PCN reaction causing severe rash involving mucus membranes or skin necrosis: No    Has patient had a PCN reaction that required hospitalization: No    Has patient had a PCN reaction occurring within the last 10 years: No    If all of the above answers are "NO", then may proceed with Cephalosporin use.    Patient has tolerated ceftriaxone in the recent past.   Sulfamethoxazole-Trimethoprim Nausea And Vomiting and Other (See Comments)    Stomach problems   Adalimumab Other (See Comments)    "Not effective"   Hydroxychloroquine Sulfate Other (See Comments)    "Plaquenil did not work after [employer]"   Meperidine Other (See Comments)    Cramping   Meperidine Hcl Other (See Comments)    Cramping    Methotrexate Nausea And Vomiting and Other (See Comments)    "Projectile vomiting"   Penicillin G Sodium Hives   Phentermine-Topiramate Nausea Only and Other (See Comments)    "Nausea and dizziness & on re-trial, mental side effects so really cannot take it   Sulfa Antibiotics Nausea And Vomiting and Other (See Comments)    Stomach problems/GI upset, also   Sulfonamide Derivatives Nausea And Vomiting and Other (See Comments)    "Stomach problems"      I spent  35 minutes providing this consultation; this includes time spent with patient/family, chart review and  documentation. More than 50% of the time in this consultation was spent on counseling and coordinating communication   Thank you for the opportunity to participate in the care of Ms. Lamartina.  The palliative care team will continue to follow. Please call our office at [phone] if we can be of additional assistance.   Teodoro Spray, NP

## 2022-04-08 ENCOUNTER — Non-Acute Institutional Stay: Payer: Medicare Other | Admitting: Hospice

## 2022-04-08 DIAGNOSIS — M069 Rheumatoid arthritis, unspecified: Secondary | ICD-10-CM

## 2022-04-08 DIAGNOSIS — F339 Major depressive disorder, recurrent, unspecified: Secondary | ICD-10-CM

## 2022-04-08 DIAGNOSIS — Z515 Encounter for palliative care: Secondary | ICD-10-CM

## 2022-04-08 DIAGNOSIS — R0602 Shortness of breath: Secondary | ICD-10-CM

## 2022-04-08 NOTE — Progress Notes (Signed)
[place] Follow-Up Visit Telephone: [phone]  Fax: [phone]   Date of encounter: 04/08/22 12:06 PM PATIENT NAME: [name] [address] [place] [address]   [phone] (home)  DOB: [birth year] MRN: [id number] PRIMARY CARE PROVIDER:    Dr Madison Hickman  REFERRING PROVIDER:   Dr. Sherril Croon  RESPONSIBLE PARTY:    Contact Information     Name Relation Home Work [place] Son   [phone]   Dravitz,Pam Daughter   [phone]        I met face to face with patient and family in [facility]. Palliative Care was asked to follow this patient by consultation request of  Dr. Sherril Croon to address advance care planning and complex medical decision making. This is follow-up visit.                                     ASSESSMENT AND PLAN / RECOMMENDATIONS:   CODE STATUS:  DNR  Symptom Management/Plan: Rheumatoid arthritis: chronic, continue Norco as ordered, and prednisone as ordered.  Follow-up with rheumatologist as planned. Shortness of breath: related to COPD. Elevate head of bed. Continue  Duoneb and Budesonide as ordered. Avoid triggers. Encourage slow deep breathing, pursed lip breathing.  Oxygen supplementation 2L/Min prn SOB.  Depression: Managed with duloxetine.  Encourage socialization and participation in facility activities.  Routine CBC CMP. Psych consult as planned/needed.  Follow up Palliative Care Visit: Palliative care will continue to follow for complex medical decision making, advance care planning, and clarification of goals. Return 4 weeks or prn.   HOSPICE ELIGIBILITY/DIAGNOSIS: TBD  Chief Complaint: Follow-up palliative visit  HISTORY OF PRESENT ILLNESS:  [name] is a 82 y.o. year old female  with multiple morbidities requiring close monitoring, with high risk of complications and mortality: RA, chronic interstitial lung disease, h/o PE, dysphagia, DMII, osteoporosis, right  knee bursitis, CKD3, anemia, fibromyalgia, hyperlipidemia, prior GI bleed, among others seen in palliative f/u.  Patient beckoned on NP to her room, reports moderate pain during visit; NP advised nursing staff to give Norco as ordered.  Patient reports effective , after 30 minutes.  Rest of 10 point ROS asked and negative History obtained from review of EMR, discussion with primary team, and interview with family, facility staff/caregiver and/or [name].  I reviewed available labs, medications, imaging, studies and related documents from the EMR.  Records reviewed and summarized above.    There is no height or weight on file to calculate BMI. Wt Readings from Last 500 Encounters:  01/09/22 152 lb (68.9 kg)  12/31/21 134 lb 9.6 oz (61.1 kg)  11/27/21 151 lb 8 oz (68.7 kg)  07/10/21 143 lb 14.4 oz (65.3 kg)  06/14/21 147 lb (66.7 kg)  05/08/21 137 lb (62.1 kg)  05/01/21 137 lb (62.1 kg)  04/24/21 138 lb (62.6 kg)  04/18/21 138 lb (62.6 kg)  04/10/21 133 lb (60.3 kg)  11/15/20 154 lb 5.2 oz (70 kg)  10/28/20 147 lb 14.9 oz (67.1 kg)  09/14/20 161 lb 3.2 oz (73.1 kg)  08/31/20 161 lb 3.2 oz (73.1 kg)  08/22/20 182 lb 12.2 oz (82.9 kg)  07/22/20 173 lb 1 oz (78.5 kg)  05/31/20 189 lb 6 oz (85.9 kg)  05/26/20 189 lb 6 oz (85.9 kg)  10/28/18 170 lb (77.1 kg)  10/21/18 170 lb (77.1 kg)  05/14/18 182  lb 1.6 oz (82.6 kg)  05/07/18 187 lb (84.8 kg)  01/19/18 187 lb (84.8 kg)  04/08/17 161 lb (73 kg)  03/14/17 173 lb 8 oz (78.7 kg)  03/09/17 173 lb 8 oz (78.7 kg)  01/15/17 177 lb (80.3 kg)  12/24/16 182 lb 9.6 oz (82.8 kg)  09/13/16 195 lb (88.5 kg)  03/18/16 197 lb (89.4 kg)  08/30/15 200 lb 6.4 oz (90.9 kg)  05/22/15 180 lb (81.6 kg)  01/05/15 185 lb (83.9 kg)  10/26/14 189 lb 6.4 oz (85.9 kg)  09/16/14 188 lb (85.3 kg)  07/25/14 194 lb (88 kg)  12/20/13 190 lb (86.2 kg)  12/06/13 190 lb (86.2 kg)  07/09/13 204 lb (92.5 kg)  06/24/13 200 lb (90.7 kg)  01/06/13 198 lb (89.8 kg)   12/09/12 198 lb (89.8 kg)  06/25/12 195 lb (88.5 kg)  06/11/12 195 lb (88.5 kg)  08/22/11 185 lb (83.9 kg)  08/08/11 185 lb (83.9 kg)  03/21/11 185 lb (83.9 kg)  03/07/11 185 lb (83.9 kg)    CURRENT PROBLEM LIST:  Patient Active Problem List   Diagnosis Date Noted   Iron deficiency anemia 04/03/2021   Acute GI bleeding 11/10/2020   Rectal bleeding 11/09/2020   Acute encephalopathy 11/09/2020   Aortic atherosclerosis ([place]) 10/28/2020   Acute blood loss anemia 10/27/2020   AKI (acute kidney injury) ([place]) 10/27/2020   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   History of pulmonary embolism 10/27/2020   GI bleed 08/20/2020   Acute lower GI bleeding 08/19/2020   Acute pulmonary embolism ([place]) 07/21/2020   Hypoxia 05/23/2020   Acute hypoxemic respiratory failure ([place]) 05/22/2020   Rotator cuff arthropathy of left shoulder 12/13/2019   Pain in joint of right shoulder 12/10/2019   Pain in joint of left shoulder 12/10/2019   Hearing loss, sensorineural, 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   Tinnitus, left 01/08/2019   DM type 2, not at goal [facility]) 06/03/2018   Frequent infections 05/21/2018   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   Pain in finger 05/07/2018   Family history of aneurysm 05/07/2018   Diabetes mellitus due to underlying condition, controlled, with complication, without long-term current use of insulin ([place])    Lower urinary tract infectious disease    Palpitations 01/17/2018   Dehydration 01/17/2018   Hypotension 01/17/2018   Symptom of blood in stool 01/17/2018   Diarrhea in adult patient 01/17/2018   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   Aftercare following joint replacement 08/21/2017   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 with presence of antibiotic-impregnated cement spacer 06/05/2017   Closed displaced transverse fracture of right patella 05/06/2017   Anemia 04/16/2017   Chronic interstitial lung disease ([place]) 04/16/2017   Chronic kidney  disease (CKD), stage III (moderate) ([place]) 04/16/2017   Other hyperlipidemia 04/16/2017   Type 2 diabetes mellitus without complication, with long-term current use of insulin ([place]) 04/16/2017   Pain in right knee 03/25/2017   Aftercare 03/25/2017   Arthritis, septic, knee ([place]) 03/09/2017   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   Fibromyalgia 06/04/2016   High risk medication use 06/04/2016   Claw toe, acquired, left 01/29/2016   Rheumatoid arthritis involving both feet with negative rheumatoid factor ([place]) 01/29/2016   Septic prepatellar bursitis of right knee 10/26/2014   Prepatellar bursitis of right knee 08/28/2014   DYSPHAGIA ORAL PHASE 06/18/2007   Asthma 05/14/2007   SLEEP APNEA 05/14/2007   Sleep apnea 05/14/2007   GERD 05/13/2007   Rheumatoid arthritis ([place]) 05/13/2007  OSTEOPOROSIS 05/13/2007   DIARRHEA, CHRONIC 05/13/2007    PAST MEDICAL HISTORY:  Active Ambulatory Problems    Diagnosis Date Noted   Asthma 05/14/2007   GERD 05/13/2007   Rheumatoid arthritis ([place]) 05/13/2007   OSTEOPOROSIS 05/13/2007   SLEEP APNEA 05/14/2007   DYSPHAGIA ORAL PHASE 06/18/2007   DIARRHEA, CHRONIC 05/13/2007   Prepatellar bursitis of right knee 08/28/2014   Septic prepatellar bursitis of right knee 10/26/2014   Arthritis, septic, knee ([place]) 03/09/2017   Palpitations 01/17/2018   Dehydration 01/17/2018   Hypotension 01/17/2018   Symptom of blood in stool 01/17/2018   Diarrhea in adult patient 01/17/2018   Diabetes mellitus due to underlying condition, controlled, with complication, without long-term current use of insulin ([place])    Lower urinary tract infectious disease    Pain in finger 05/07/2018   Family history of aneurysm 05/07/2018   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 with presence of antibiotic-impregnated cement spacer 06/05/2017   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[place])  06/04/2016   Anemia 04/16/2017   Chronic interstitial lung disease ([place]) 04/16/2017   Chronic kidney disease (CKD), stage III (moderate) ([place]) 04/16/2017   Claw toe, acquired, left 01/29/2016   Closed displaced transverse fracture of right patella 05/06/2017   DM type 2, not at goal [facility]) 06/03/2018   Fibromyalgia 06/04/2016   Frequent infections 05/21/2018   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   Hearing loss, sensorineural, 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   High risk medication use 06/04/2016   Other hyperlipidemia 04/16/2017   Pain in right knee 03/25/2017   Rheumatoid arthritis involving both feet with negative rheumatoid factor ([place]) 01/29/2016   Tinnitus, left 01/08/2019   Type 2 diabetes mellitus without complication, with long-term current use of insulin ([place]) 04/16/2017   Sleep apnea 05/14/2007   Aftercare following joint replacement 08/21/2017   Aftercare 03/25/2017   Pain in joint of right shoulder 12/10/2019   Pain in joint of left shoulder 12/10/2019   Rotator cuff arthropathy of left shoulder 12/13/2019   Acute hypoxemic respiratory failure ([place]) 05/22/2020   Hypoxia 05/23/2020   Acute pulmonary embolism ([place]) 07/21/2020   Acute lower GI bleeding 08/19/2020   GI bleed 08/20/2020   Acute blood loss anemia 10/27/2020   AKI (acute kidney injury) ([place]) 10/27/2020   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   History of pulmonary embolism 10/27/2020   Aortic atherosclerosis ([place]) 10/28/2020   Rectal bleeding 11/09/2020   Acute encephalopathy 11/09/2020   Acute GI bleeding 11/10/2020   Iron deficiency anemia 04/03/2021   Resolved Ambulatory Problems    Diagnosis Date Noted   No Resolved Ambulatory Problems   Past Medical History:  Diagnosis Date   Asthma    Complication of anesthesia    Diabetes mellitus without complication ([place])    Emphysema    Osteoporosis    Rheumatoid arthritis(714.0)     SOCIAL HX:  Social History   Tobacco Use   Smoking status: Former    Types: Cigarettes     Quit date: 03/11/1994    Years since quitting: 28.0   Smokeless tobacco: Never  Substance Use Topics   Alcohol use: No     ALLERGIES:  Allergies  Allergen Reactions   Penicillins Hives    Has patient had a PCN reaction causing immediate rash, facial/tongue/throat swelling, SOB or lightheadedness with hypotension: Yes  Has patient had a PCN reaction causing severe rash involving mucus membranes or skin necrosis: No  Has patient had a PCN reaction  that required hospitalization: No  Has patient had a PCN reaction occurring within the last 10 years: No  If all of the above answers are "NO", then may proceed with Cephalosporin use.  Patient has tolerated ceftriaxone in the recent past.  Other Reaction: Allergy    Has patient had a PCN reaction causing immediate rash, facial/tongue/throat swelling, SOB or lightheadedness with hypotension: Yes    Has patient had a PCN reaction causing severe rash involving mucus membranes or skin necrosis: No    Has patient had a PCN reaction that required hospitalization: No    Has patient had a PCN reaction occurring within the last 10 years: No    If all of the above answers are "NO", then may proceed with Cephalosporin use.    Patient has tolerated ceftriaxone in the recent past.   Sulfamethoxazole-Trimethoprim Nausea And Vomiting and Other (See Comments)    Stomach problems   Adalimumab Other (See Comments)    "Not effective"   Hydroxychloroquine Sulfate Other (See Comments)    "Plaquenil did not work after [employer]"   Meperidine Other (See Comments)    Cramping   Meperidine Hcl Other (See Comments)    Cramping    Methotrexate Nausea And Vomiting and Other (See Comments)    "Projectile vomiting"   Penicillin G Sodium Hives   Phentermine-Topiramate Nausea Only and Other (See Comments)    "Nausea and dizziness & on re-trial, mental side effects so really cannot take it   Sulfa Antibiotics Nausea And Vomiting and Other (See Comments)     Stomach problems/GI upset, also   Sulfonamide Derivatives Nausea And Vomiting and Other (See Comments)    "Stomach problems"      I spent  35 minutes providing this consultation; this includes time spent with patient/family, chart review and documentation. More than 50% of the time in this consultation was spent on counseling and coordinating communication   Thank you for the opportunity to participate in the care of Ms. Julio.  The palliative care team will continue to follow. Please call our office at [phone] if we can be of additional assistance.   Teodoro Spray, NP

## 2022-05-03 ENCOUNTER — Non-Acute Institutional Stay: Payer: Medicare Other | Admitting: Hospice

## 2022-05-03 DIAGNOSIS — M069 Rheumatoid arthritis, unspecified: Secondary | ICD-10-CM

## 2022-05-03 DIAGNOSIS — Z515 Encounter for palliative care: Secondary | ICD-10-CM

## 2022-05-03 DIAGNOSIS — Z794 Long term (current) use of insulin: Secondary | ICD-10-CM

## 2022-05-03 DIAGNOSIS — F339 Major depressive disorder, recurrent, unspecified: Secondary | ICD-10-CM

## 2022-05-03 DIAGNOSIS — E119 Type 2 diabetes mellitus without complications: Secondary | ICD-10-CM

## 2022-05-03 DIAGNOSIS — R0602 Shortness of breath: Secondary | ICD-10-CM

## 2022-05-03 NOTE — Progress Notes (Signed)
[facility] Follow-Up Visit Telephone: [phone]  Fax: [phone]   Date of encounter: 05/03/22 11:15 AM PATIENT NAME: [name] [address] [place] [address]   [phone] (home)  DOB: [birth year] MRN: [id number] PRIMARY CARE PROVIDER:    Dr Madison Hickman  REFERRING PROVIDER:   Dr. Sherril Croon  RESPONSIBLE PARTY:    Contact Information     Name Relation Home Work [place] Son   [phone]   Dravitz,Pam Daughter   [phone]        I met face to face with patient and family in [facility]. Palliative Care was asked to follow this patient by consultation request of  Dr. Sherril Croon to address advance care planning and complex medical decision making. This is follow-up visit.                                     ASSESSMENT AND PLAN / RECOMMENDATIONS:   CODE STATUS:  DNR  Symptom Management/Plan: Rheumatoid arthritis: chronic/stable, continue Norco as ordered. Follow-up with rheumatologist as planned. Type 2 diabetes mellitus: Current A1c 6.2 02/12/2022.  Continue metformin and insulin as ordered.  No concentrated sweet.  Repeat A1c.  Shortness of breath: related to COPD.  No recent exacerbation.  Elevate head of bed. Continue  Duoneb and Budesonide as ordered. Avoid triggers. Encourage slow deep breathing, pursed lip breathing.  Oxygen supplementation 2L/Min prn SOB.   Depression: Managed with duloxetine.  Encourage socialization and participation in facility activities.  Routine CBC CMP. Psych consult as planned/needed.  Follow up Palliative Care Visit: Palliative care will continue to follow for complex medical decision making, advance care planning, and clarification of goals. Return 4 weeks or prn.   HOSPICE ELIGIBILITY/DIAGNOSIS: TBD  Chief Complaint: Follow-up palliative visit  HISTORY OF PRESENT ILLNESS:  [name] is a 82 y.o. year old female  with multiple morbidities requiring close  monitoring, with high risk of complications and mortality: RA, chronic interstitial lung disease, h/o PE, dysphagia, DMII, type 2 diabetes mellitus, osteoporosis, right knee bursitis, CKD3, anemia, fibromyalgia, hyperlipidemia, prior GI bleed, among others seen in palliative f/u.  Patient denies pain during visit, in no respiratory distress.  Nursing with no concerns.  Rest of 10 point ROS asked and negative History obtained from review of EMR, discussion with primary team, and interview with family, facility staff/caregiver and/or [name].  I reviewed available labs, medications, imaging, studies and related documents from the EMR.  Records reviewed and summarized above.    CURRENT PROBLEM LIST:  Patient Active Problem List   Diagnosis Date Noted   Iron deficiency anemia 04/03/2021   Acute GI bleeding 11/10/2020   Rectal bleeding 11/09/2020   Acute encephalopathy 11/09/2020   Aortic atherosclerosis ([place]) 10/28/2020   Acute blood loss anemia 10/27/2020   AKI (acute kidney injury) ([place]) 10/27/2020   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   History of pulmonary embolism 10/27/2020   GI bleed 08/20/2020   Acute lower GI bleeding 08/19/2020   Acute pulmonary embolism ([place]) 07/21/2020   Hypoxia 05/23/2020   Acute hypoxemic respiratory failure ([place]) 05/22/2020   Rotator cuff arthropathy of left shoulder 12/13/2019   Pain in joint of right shoulder 12/10/2019   Pain in joint of left shoulder 12/10/2019   Hearing loss, sensorineural, 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   Tinnitus, left 01/08/2019   DM type 2, not at goal [facility]) 06/03/2018  Frequent infections 05/21/2018   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   Pain in finger 05/07/2018   Family history of aneurysm 05/07/2018   Diabetes mellitus due to underlying condition, controlled, with complication, without long-term current use of insulin ([place])    Lower urinary tract infectious disease    Palpitations 01/17/2018   Dehydration 01/17/2018   Hypotension 01/17/2018    Symptom of blood in stool 01/17/2018   Diarrhea in adult patient 01/17/2018   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   Aftercare following joint replacement 08/21/2017   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 with presence of antibiotic-impregnated cement spacer 06/05/2017   Closed displaced transverse fracture of right patella 05/06/2017   Anemia 04/16/2017   Chronic interstitial lung disease ([place]) 04/16/2017   Chronic kidney disease (CKD), stage III (moderate) ([place]) 04/16/2017   Other hyperlipidemia 04/16/2017   Type 2 diabetes mellitus without complication, with long-term current use of insulin ([place]) 04/16/2017   Pain in right knee 03/25/2017   Aftercare 03/25/2017   Arthritis, septic, knee ([place]) 03/09/2017   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   Fibromyalgia 06/04/2016   High risk medication use 06/04/2016   Claw toe, acquired, left 01/29/2016   Rheumatoid arthritis involving both feet with negative rheumatoid factor ([place]) 01/29/2016   Septic prepatellar bursitis of right knee 10/26/2014   Prepatellar bursitis of right knee 08/28/2014   DYSPHAGIA ORAL PHASE 06/18/2007   Asthma 05/14/2007   SLEEP APNEA 05/14/2007   Sleep apnea 05/14/2007   GERD 05/13/2007   Rheumatoid arthritis ([place]) 05/13/2007   OSTEOPOROSIS 05/13/2007   DIARRHEA, CHRONIC 05/13/2007    PAST MEDICAL HISTORY:  Active Ambulatory Problems    Diagnosis Date Noted   Asthma 05/14/2007   GERD 05/13/2007   Rheumatoid arthritis ([place]) 05/13/2007   OSTEOPOROSIS 05/13/2007   SLEEP APNEA 05/14/2007   DYSPHAGIA ORAL PHASE 06/18/2007   DIARRHEA, CHRONIC 05/13/2007   Prepatellar bursitis of right knee 08/28/2014   Septic prepatellar bursitis of right knee 10/26/2014   Arthritis, septic, knee ([place]) 03/09/2017   Palpitations 01/17/2018   Dehydration 01/17/2018   Hypotension 01/17/2018   Symptom of blood in stool 01/17/2018   Diarrhea in adult patient  01/17/2018   Diabetes mellitus due to underlying condition, controlled, with complication, without long-term current use of insulin ([place])    Lower urinary tract infectious disease    Pain in finger 05/07/2018   Family history of aneurysm 05/07/2018   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 with presence of antibiotic-impregnated cement spacer 06/05/2017   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   Anemia 04/16/2017   Chronic interstitial lung disease ([place]) 04/16/2017   Chronic kidney disease (CKD), stage III (moderate) ([place]) 04/16/2017   Claw toe, acquired, left 01/29/2016   Closed displaced transverse fracture of right patella 05/06/2017   DM type 2, not at goal [facility]) 06/03/2018   Fibromyalgia 06/04/2016   Frequent infections 05/21/2018   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   Hearing loss, sensorineural, 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   High risk medication use 06/04/2016   Other hyperlipidemia 04/16/2017   Pain in right knee 03/25/2017   Rheumatoid arthritis involving both feet with negative rheumatoid factor ([place]) 01/29/2016   Tinnitus, left 01/08/2019   Type 2 diabetes mellitus without complication, with long-term current use of insulin ([place]) 04/16/2017   Sleep apnea 05/14/2007   Aftercare following joint replacement 08/21/2017   Aftercare 03/25/2017   Pain in joint of right shoulder  12/10/2019   Pain in joint of left shoulder 12/10/2019   Rotator cuff arthropathy of left shoulder 12/13/2019   Acute hypoxemic respiratory failure ([place]) 05/22/2020   Hypoxia 05/23/2020   Acute pulmonary embolism ([place]) 07/21/2020   Acute lower GI bleeding 08/19/2020   GI bleed 08/20/2020   Acute blood loss anemia 10/27/2020   AKI (acute kidney injury) ([place]) 10/27/2020   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   History of pulmonary embolism 10/27/2020   Aortic atherosclerosis ([place]) 10/28/2020   Rectal bleeding 11/09/2020   Acute  encephalopathy 11/09/2020   Acute GI bleeding 11/10/2020   Iron deficiency anemia 04/03/2021   Resolved Ambulatory Problems    Diagnosis Date Noted   No Resolved Ambulatory Problems   Past Medical History:  Diagnosis Date   Asthma    Complication of anesthesia    Diabetes mellitus without complication ([place])    Emphysema    Osteoporosis    Rheumatoid arthritis(714.0)     SOCIAL HX:  Social History   Tobacco Use   Smoking status: Former    Types: Cigarettes    Quit date: 03/11/1994    Years since quitting: 28.1   Smokeless tobacco: Never  Substance Use Topics   Alcohol use: No     ALLERGIES:  Allergies  Allergen Reactions   Penicillins Hives    Has patient had a PCN reaction causing immediate rash, facial/tongue/throat swelling, SOB or lightheadedness with hypotension: Yes  Has patient had a PCN reaction causing severe rash involving mucus membranes or skin necrosis: No  Has patient had a PCN reaction that required hospitalization: No  Has patient had a PCN reaction occurring within the last 10 years: No  If all of the above answers are "NO", then may proceed with Cephalosporin use.  Patient has tolerated ceftriaxone in the recent past.  Other Reaction: Allergy    Has patient had a PCN reaction causing immediate rash, facial/tongue/throat swelling, SOB or lightheadedness with hypotension: Yes    Has patient had a PCN reaction causing severe rash involving mucus membranes or skin necrosis: No    Has patient had a PCN reaction that required hospitalization: No    Has patient had a PCN reaction occurring within the last 10 years: No    If all of the above answers are "NO", then may proceed with Cephalosporin use.    Patient has tolerated ceftriaxone in the recent past.   Sulfamethoxazole-Trimethoprim Nausea And Vomiting and Other (See Comments)    Stomach problems   Adalimumab Other (See Comments)    "Not effective"   Hydroxychloroquine Sulfate Other (See  Comments)    "Plaquenil did not work after [employer]"   Meperidine Other (See Comments)    Cramping   Meperidine Hcl Other (See Comments)    Cramping    Methotrexate Nausea And Vomiting and Other (See Comments)    "Projectile vomiting"   Penicillin G Sodium Hives   Phentermine-Topiramate Nausea Only and Other (See Comments)    "Nausea and dizziness & on re-trial, mental side effects so really cannot take it   Sulfa Antibiotics Nausea And Vomiting and Other (See Comments)    Stomach problems/GI upset, also   Sulfonamide Derivatives Nausea And Vomiting and Other (See Comments)    "Stomach problems"      I spent  35 minutes providing this consultation; this includes time spent with patient/family, chart review and documentation. More than 50% of the time in this consultation was spent on counseling and coordinating communication   Thank you  for the opportunity to participate in the care of Ms. Greenhill.  The palliative care team will continue to follow. Please call our office at [phone] if we can be of additional assistance.   Teodoro Spray, NP

## 2022-05-30 ENCOUNTER — Telehealth: Payer: Self-pay | Admitting: Hematology and Oncology

## 2022-05-30 NOTE — Telephone Encounter (Signed)
Called patient twice unable to leave vm. Mailbox full

## 2022-06-03 ENCOUNTER — Ambulatory Visit: Payer: BC Managed Care – PPO | Admitting: Psychologist

## 2022-06-07 ENCOUNTER — Non-Acute Institutional Stay: Payer: Medicare Other | Admitting: Hospice

## 2022-06-07 DIAGNOSIS — F339 Major depressive disorder, recurrent, unspecified: Secondary | ICD-10-CM

## 2022-06-07 DIAGNOSIS — M069 Rheumatoid arthritis, unspecified: Secondary | ICD-10-CM

## 2022-06-07 DIAGNOSIS — Z515 Encounter for palliative care: Secondary | ICD-10-CM

## 2022-06-07 DIAGNOSIS — J449 Chronic obstructive pulmonary disease, unspecified: Secondary | ICD-10-CM

## 2022-06-07 NOTE — Progress Notes (Signed)
[facility] Follow-Up Visit Telephone: [phone]  Fax: [phone]   PATIENT NAME: [name] [address] [place] [address]   [phone] (home)  DOB: 03/02/1941 MRN: [id number] PRIMARY CARE PROVIDER:    Dr Madison Hickman  REFERRING PROVIDER:   Dr. Sherril Croon  RESPONSIBLE PARTY:    Contact Information     Name Relation Home Work [place] Son   [phone]   Dravitz,Pam Daughter   [phone]        I met face to face with patient and family in [facility]. Palliative Care was asked to follow this patient by consultation request of  Dr. Sherril Croon to address advance care planning and complex medical decision making. This is follow-up visit.   Counselling. Patient said she enjoys Bingo and getting food delivered to her                                    ASSESSMENT AND PLAN / RECOMMENDATIONS:   CODE STATUS:  DNR  Symptom Management/Plan: Rheumatoid arthritis: chronic/stable, scheduled Norco discontinued, now prn. Monitor for pain and take prn Norco as ordered. Follow-up with rheumatologist as planned. Type 2 diabetes mellitus: Stable. Current A1c 6.2 02/12/2022.  Continue metformin and insulin as ordered.  No concentrated sweet.  Repeat A1c.  COPD.  No recent exacerbation. Not on oxygen supplementation. Elevate head of bed. Continue  Duoneb and Budesonide as ordered. Avoid triggers. Encourage slow deep breathing, pursed lip breathing.   Depression: Managed with duloxetine.  Encourage socialization and participation in facility activities.  Routine CBC CMP. Psych consult as planned/needed.  Follow up Palliative Care Visit: Palliative care will continue to follow for complex medical decision making, advance care planning, and clarification of goals. Return 4 weeks or prn.   HOSPICE ELIGIBILITY/DIAGNOSIS: TBD  Chief Complaint: Follow-up palliative visit  HISTORY OF PRESENT ILLNESS:  [name] is a 82  y.o. year old female  with multiple morbidities requiring close monitoring, with high risk of complications and mortality: RA, chronic interstitial lung disease, h/o PE, dysphagia, DMII, type 2 diabetes mellitus, osteoporosis, right knee bursitis, CKD3, anemia, fibromyalgia, hyperlipidemia, prior GI bleed, among others seen in palliative f/u.  Patient denies pain during visit, in no respiratory distress; eports doing better, more alert and more engaging with her environment since she stopped taking her scheduled Norco. Nursing with no concerns.  Rest of 10 point ROS asked and negative History obtained from review of EMR, discussion with primary team, and interview with family, facility staff/caregiver and/or Ms. Bebeau.  I reviewed available labs, medications, imaging, studies and related documents from the EMR.  Records reviewed and summarized above.    CURRENT PROBLEM LIST:  Patient Active Problem List   Diagnosis Date Noted   Iron deficiency anemia 04/03/2021   Acute GI bleeding 11/10/2020   Rectal bleeding 11/09/2020   Acute encephalopathy 11/09/2020   Aortic atherosclerosis ([place]) 10/28/2020   Acute blood loss anemia 10/27/2020   AKI (acute kidney injury) ([place]) 10/27/2020   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   History of pulmonary embolism 10/27/2020   GI bleed 08/20/2020   Acute lower GI bleeding 08/19/2020   Acute pulmonary embolism ([place]) 07/21/2020   Hypoxia 05/23/2020   Acute hypoxemic respiratory failure ([place]) 05/22/2020   Rotator cuff arthropathy of left shoulder 12/13/2019   Pain in joint of right shoulder 12/10/2019   Pain in joint  of left shoulder 12/10/2019   Hearing loss, sensorineural, 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   Tinnitus, left 01/08/2019   DM type 2, not at goal [facility]) 06/03/2018   Frequent infections 05/21/2018   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   Pain in finger 05/07/2018   Family history of aneurysm 05/07/2018   Diabetes mellitus due to underlying condition, controlled, with  complication, without long-term current use of insulin ([place])    Lower urinary tract infectious disease    Palpitations 01/17/2018   Dehydration 01/17/2018   Hypotension 01/17/2018   Symptom of blood in stool 01/17/2018   Diarrhea in adult patient 01/17/2018   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   Aftercare following joint replacement 08/21/2017   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 with presence of antibiotic-impregnated cement spacer 06/05/2017   Closed displaced transverse fracture of right patella 05/06/2017   Anemia 04/16/2017   Chronic interstitial lung disease ([place]) 04/16/2017   Chronic kidney disease (CKD), stage III (moderate) ([place]) 04/16/2017   Other hyperlipidemia 04/16/2017   Type 2 diabetes mellitus without complication, with long-term current use of insulin ([place]) 04/16/2017   Pain in right knee 03/25/2017   Aftercare 03/25/2017   Arthritis, septic, knee ([place]) 03/09/2017   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   Fibromyalgia 06/04/2016   High risk medication use 06/04/2016   Claw toe, acquired, left 01/29/2016   Rheumatoid arthritis involving both feet with negative rheumatoid factor ([place]) 01/29/2016   Septic prepatellar bursitis of right knee 10/26/2014   Prepatellar bursitis of right knee 08/28/2014   DYSPHAGIA ORAL PHASE 06/18/2007   Asthma 05/14/2007   SLEEP APNEA 05/14/2007   Sleep apnea 05/14/2007   GERD 05/13/2007   Rheumatoid arthritis ([place]) 05/13/2007   OSTEOPOROSIS 05/13/2007   DIARRHEA, CHRONIC 05/13/2007    PAST MEDICAL HISTORY:  Active Ambulatory Problems    Diagnosis Date Noted   Asthma 05/14/2007   GERD 05/13/2007   Rheumatoid arthritis ([place]) 05/13/2007   OSTEOPOROSIS 05/13/2007   SLEEP APNEA 05/14/2007   DYSPHAGIA ORAL PHASE 06/18/2007   DIARRHEA, CHRONIC 05/13/2007   Prepatellar bursitis of right knee 08/28/2014   Septic prepatellar bursitis of right knee 10/26/2014   Arthritis,  septic, knee ([place]) 03/09/2017   Palpitations 01/17/2018   Dehydration 01/17/2018   Hypotension 01/17/2018   Symptom of blood in stool 01/17/2018   Diarrhea in adult patient 01/17/2018   Diabetes mellitus due to underlying condition, controlled, with complication, without long-term current use of insulin ([place])    Lower urinary tract infectious disease    Pain in finger 05/07/2018   Family history of aneurysm 05/07/2018   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 with presence of antibiotic-impregnated cement spacer 06/05/2017   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   Anemia 04/16/2017   Chronic interstitial lung disease ([place]) 04/16/2017   Chronic kidney disease (CKD), stage III (moderate) ([place]) 04/16/2017   Claw toe, acquired, left 01/29/2016   Closed displaced transverse fracture of right patella 05/06/2017   DM type 2, not at goal [facility]) 06/03/2018   Fibromyalgia 06/04/2016   Frequent infections 05/21/2018   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   Hearing loss, sensorineural, 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   High risk medication use 06/04/2016   Other hyperlipidemia 04/16/2017   Pain in right knee 03/25/2017   Rheumatoid arthritis involving both feet with negative rheumatoid factor ([place]) 01/29/2016   Tinnitus, left 01/08/2019   Type 2 diabetes mellitus without complication, with long-term current use  of insulin ([place]) 04/16/2017   Sleep apnea 05/14/2007   Aftercare following joint replacement 08/21/2017   Aftercare 03/25/2017   Pain in joint of right shoulder 12/10/2019   Pain in joint of left shoulder 12/10/2019   Rotator cuff arthropathy of left shoulder 12/13/2019   Acute hypoxemic respiratory failure ([place]) 05/22/2020   Hypoxia 05/23/2020   Acute pulmonary embolism ([place]) 07/21/2020   Acute lower GI bleeding 08/19/2020   GI bleed 08/20/2020   Acute blood loss anemia 10/27/2020   AKI (acute kidney  injury) ([place]) 10/27/2020   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   History of pulmonary embolism 10/27/2020   Aortic atherosclerosis ([place]) 10/28/2020   Rectal bleeding 11/09/2020   Acute encephalopathy 11/09/2020   Acute GI bleeding 11/10/2020   Iron deficiency anemia 04/03/2021   Resolved Ambulatory Problems    Diagnosis Date Noted   No Resolved Ambulatory Problems   Past Medical History:  Diagnosis Date   Asthma    Complication of anesthesia    Diabetes mellitus without complication ([place])    Emphysema    Osteoporosis    Rheumatoid arthritis(714.0)     SOCIAL HX:  Social History   Tobacco Use   Smoking status: Former    Types: Cigarettes    Quit date: 03/11/1994    Years since quitting: 28.1   Smokeless tobacco: Never  Substance Use Topics   Alcohol use: No     ALLERGIES:  Allergies  Allergen Reactions   Penicillins Hives    Has patient had a PCN reaction causing immediate rash, facial/tongue/throat swelling, SOB or lightheadedness with hypotension: Yes  Has patient had a PCN reaction causing severe rash involving mucus membranes or skin necrosis: No  Has patient had a PCN reaction that required hospitalization: No  Has patient had a PCN reaction occurring within the last 10 years: No  If all of the above answers are "NO", then may proceed with Cephalosporin use.  Patient has tolerated ceftriaxone in the recent past.  Other Reaction: Allergy    Has patient had a PCN reaction causing immediate rash, facial/tongue/throat swelling, SOB or lightheadedness with hypotension: Yes    Has patient had a PCN reaction causing severe rash involving mucus membranes or skin necrosis: No    Has patient had a PCN reaction that required hospitalization: No    Has patient had a PCN reaction occurring within the last 10 years: No    If all of the above answers are "NO", then may proceed with Cephalosporin use.    Patient has tolerated ceftriaxone in the recent past.    Sulfamethoxazole-Trimethoprim Nausea And Vomiting and Other (See Comments)    Stomach problems   Adalimumab Other (See Comments)    "Not effective"   Hydroxychloroquine Sulfate Other (See Comments)    "Plaquenil did not work after [employer]"   Meperidine Other (See Comments)    Cramping   Meperidine Hcl Other (See Comments)    Cramping    Methotrexate Nausea And Vomiting and Other (See Comments)    "Projectile vomiting"   Penicillin G Sodium Hives   Phentermine-Topiramate Nausea Only and Other (See Comments)    "Nausea and dizziness & on re-trial, mental side effects so really cannot take it   Sulfa Antibiotics Nausea And Vomiting and Other (See Comments)    Stomach problems/GI upset, also   Sulfonamide Derivatives Nausea And Vomiting and Other (See Comments)    "Stomach problems"      I spent  35 minutes providing this consultation; this includes  time spent with patient/family, chart review and documentation. More than 50% of the time in this consultation was spent on counseling and coordinating communication   Thank you for the opportunity to participate in the care of Ms. Merenda.  The palliative care team will continue to follow. Please call our office at [phone] if we can be of additional assistance.   Teodoro Spray, NP

## 2022-06-13 ENCOUNTER — Ambulatory Visit: Payer: Medicare Other | Admitting: Psychology

## 2022-06-13 NOTE — Progress Notes (Incomplete)
[facility] Counselor Initial Adult Exam  Name: [name] Date: 06/13/2022 MRN: [id number] DOB: Dec 24, 1940 PCP: Pcp, No  Time Spent: 1:00  pm - 2:00 pm: 60 Minutes   [name] participated from {Patient Location:26691::"home"}, via video, and consented to treatment. Therapist participated from home office. We met online due to [place] pandemic.   Guardian/Payee:  ***    Paperwork requested: [id number]  Reason for Visit /Presenting Problem: ***  Mental Status Exam: Appearance:   {PSY:22683}     Behavior:  {PSY:21022743}  Motor:  {PSY:22302}  Speech/Language:   {PSY:22685}  Affect:  {PSY:22687}  Mood:  {PSY:31886}  Thought process:  {PSY:31888}  Thought content:    {[phone]}  Sensory/Perceptual disturbances:    {PSY:6193966132}  Orientation:  {PSY:30297}  Attention:  {PSY:22877}  Concentration:  {PSY:323 752 7418}  Memory:  {[phone]}  Fund of knowledge:   {PSY:323 752 7418}  Insight:    {PSY:323 752 7418}  Judgment:   {PSY:323 752 7418}  Impulse Control:  {PSY:323 752 7418}   Appearance: {PSY:22683}    Behavior: {PSY:21022743} Motor: {PSY:22302} Speech/Language: {PSY:22685} Affect: {PSY:22687} Mood: {PSY:31886} Thought process: {PSY:31888} Thought content: {[phone]} Sensory/Perceptual disturbances: {PSY:6193966132} Orientation: {PSY:30297} Attention: {PSY:22877} Concentration: {PSY:323 752 7418} Memory: {[phone]} Fund of knowledge: {PSY:323 752 7418} Insight: {PSY:323 752 7418} Judgment: {PSY:323 752 7418} Impulse Control: {PSY:323 752 7418}  Reported Symptoms:  ***  Risk Assessment: Danger to Self:  {PSY:22692} Self-injurious Behavior: {PSY:22692} Danger to Others: {PSY:22692} Duty to Warn:{PSY:311194} Physical Aggression / Violence:{PSY:21197} Access to Firearms a concern: {PSY:21197} Gang Involvement:{PSY:21197} Patient / guardian was educated about steps to take if suicide or homicide risk level increases between visits:  {Yes/No-Ex:120004} While future psychiatric events cannot be accurately predicted, the patient does not currently require acute inpatient psychiatric care and does not currently meet [facility] involuntary commitment criteria.  Substance Abuse History: Current substance abuse: {PSY:21197}    Past Psychiatric History:   {Past psych history:20559} Outpatient Providers:*** History of Psych Hospitalization: {PSY:21197} Psychological Testing: {PSY:21014032}   Abuse History:  Victim of: {Abuse History:314532}, {Type of abuse:20566}   Report needed: {PSY:314532} Victim of Neglect:{yes no:314532} Perpetrator of {PSY:20566}  Witness / Exposure to Domestic Violence: {PSY:21197}  Protective Services Involvement: {PSY:21197} Witness to [employer] Violence:  {PSY:21197}  Family History:  Family History  Problem Relation Age of Onset   Hypertension Other    Diabetes Other    Stroke Other    Diabetes Mother    Diabetes Father    Diabetes Sister    Diabetes Maternal Aunt    Cancer Paternal Uncle    Cancer Paternal Grandmother    Breast cancer Paternal Grandmother     Living situation: the patient {lives:315711::"lives with their family"}  Sexual Orientation: {Sexual [phone]}  Relationship Status: {Desc; marital status:62}  Name of spouse / other:*** If a parent, number of children / ages:***  Support Systems: {DIABETES SUPPORT:20310}  Financial Stress:  {YES/NO:21197}  Income/Employment/Disability: Geneticist, molecular: [employer]  Educational History: Education: {PSY :31912}  Religion/Sprituality/World View: {CHL AMB RELIGION/SPIRITUALITY:[phone]}  Any cultural differences that may affect / interfere with treatment:  {Religious/Cultural:200019}  Recreation/Hobbies: {Woc hobbies:30428}  Stressors: {PATIENT STRESSORS:22669}  Strengths: {Patient Coping Strengths:[phone]}  Barriers:  ***   Legal History: Pending  legal issue / charges: {PSY:20588} History of legal issue / charges: {Legal Issues:[phone]}  Medical History/Surgical History: {Desc; reviewed/not reviewed:60074} Past Medical History:  Diagnosis Date   Acute pulmonary embolism (HCC)    b/l; dx'ed May '22   Asthma    Complication of anesthesia    Anesthesia told her years ago she got too cold during  surgery   Diabetes mellitus without complication ([place])    Emphysema    Fibromyalgia    Osteoporosis    Rheumatoid arthritis(714.0)    Sleep apnea    no cpap    Past Surgical History:  Procedure Laterality Date   ABDOMINAL HYSTERECTOMY     ANKLE FRACTURE SURGERY  2007   BREAST SURGERY     mammoplasty and then removed   COLONOSCOPY WITH PROPOFOL N/A 08/22/2020   Procedure: COLONOSCOPY WITH PROPOFOL;  Surgeon: Ronnette Juniper, MD;  Location: [facility] ENDOSCOPY;  Service: Gastroenterology;  Laterality: N/A;  If schedule availability permits, would like to add on upper endoscopy as well   ENTEROSCOPY N/A 11/11/2020   Procedure: ENTEROSCOPY;  Surgeon: Arta Silence, MD;  Location: [facility] ENDOSCOPY;  Service: Endoscopy;  Laterality: N/A;   ESOPHAGOGASTRODUODENOSCOPY (EGD) WITH PROPOFOL N/A 08/22/2020   Procedure: ESOPHAGOGASTRODUODENOSCOPY (EGD) WITH PROPOFOL;  Surgeon: Ronnette Juniper, MD;  Location: [facility] ENDOSCOPY;  Service: Gastroenterology;  Laterality: N/A;   FOOT SURGERY     numerous   GIVENS CAPSULE STUDY N/A 10/28/2020   Procedure: GIVENS CAPSULE STUDY;  Surgeon: Otis Brace, MD;  Location: [facility] ENDOSCOPY;  Service: Gastroenterology;  Laterality: N/A;   HOT HEMOSTASIS N/A 08/22/2020   Procedure: HOT HEMOSTASIS (ARGON PLASMA COAGULATION/BICAP);  Surgeon: Ronnette Juniper, MD;  Location: [facility];  Service: Gastroenterology;  Laterality: N/A;   I & D EXTREMITY Right 10/26/2014   Procedure: IRRIGATION AND DEBRIDEMENT RIGHT KNEE ;  Surgeon: Gaynelle Arabian, MD;  Location: [facility] ORS;  Service: Orthopedics;  Laterality: Right;   IRRIGATION AND DEBRIDEMENT KNEE  Right 03/09/2017   Procedure: IRRIGATION AND DEBRIDEMENT RIGHT KNEE PRE-PATELLAR BURSA;  Surgeon: Susa Day, MD;  Location: [facility];  Service: Orthopedics;  Laterality: Right;   KNEE BURSECTOMY Right 09/16/2014   Procedure: EXCISON OF PRE PATELLA BURSA RIGHT KNEE ;  Surgeon: Gaynelle Arabian, MD;  Location: [facility];  Service: Orthopedics;  Laterality: Right;   PARTIAL HYSTERECTOMY  1970   POLYPECTOMY  08/22/2020   Procedure: POLYPECTOMY;  Surgeon: Ronnette Juniper, MD;  Location: [facility];  Service: Gastroenterology;;   TOTAL KNEE ARTHROPLASTY  2005 / 2006   x2    Medications: Current Outpatient Medications  Medication Sig Dispense Refill   acetaminophen (TYLENOL) 500 MG tablet Take 1,000 mg by mouth every 6 (six) hours as needed for mild pain (or headaches).     ascorbic acid (VITAMIN C) 500 MG tablet Take 500 mg by mouth 2 (two) times daily.     atorvastatin (LIPITOR) 20 MG tablet Take 20 mg by mouth daily.     azelastine (OPTIVAR) 0.05 % ophthalmic solution Place 1 drop into both eyes daily.     budesonide (PULMICORT) 0.25 MG/2ML nebulizer solution Take 2 mLs (0.25 mg total) by nebulization 2 (two) times daily. 120 mL 0   DULoxetine (CYMBALTA) 60 MG capsule Take 60 mg by mouth at bedtime.      ezetimibe (ZETIA) 10 MG tablet Take 10 mg by mouth at bedtime.      ferrous sulfate 325 (65 FE) MG tablet 1 tablet     folic acid (FOLVITE) 1 MG tablet Take 1 mg by mouth daily.     HYDROcodone-acetaminophen (NORCO) 10-325 MG tablet Take 1 tablet by mouth 3 (three) times daily as needed.     HYDROcodone-acetaminophen (NORCO/VICODIN) 5-325 MG tablet Take 1 tablet by mouth 4 (four) times daily as needed for pain. (Patient not taking: Reported on 11/27/2021)     hydrocortisone ointment 0.5 % Apply 1  Application topically 2 (two) times daily. Apply to hips     insulin glargine (LANTUS) 100 unit/mL SOPN Inject 8 Units into the skin at bedtime.     ipratropium-albuterol (DUONEB) 0.5-2.5 (3) MG/3ML SOLN Take 3  mLs by nebulization every 6 (six) hours as needed (wheezing).     lidocaine (ASPERCREME LIDOCAINE) 4 % Place 1 patch onto the skin daily. To rt shoulder     loperamide (IMODIUM) 2 MG capsule Take by mouth as needed for diarrhea or loose stools.     metFORMIN (GLUCOPHAGE) 850 MG tablet Take 850 mg by mouth 2 (two) times daily.     metoprolol tartrate (LOPRESSOR) 25 MG tablet Take 25 mg by mouth daily.     mirabegron ER (MYRBETRIQ) 25 MG TB24 tablet Take 25 mg by mouth daily.     NOVOLIN 70/30 KWIKPEN (70-30) 100 UNIT/ML KwikPen Inject 10 Units into the skin 2 (two) times daily. (Patient not taking: Reported on 11/27/2021)     Olopatadine HCl (PATADAY) 0.7 % SOLN Place 1 drop into both eyes 2 (two) times daily.     ondansetron (ZOFRAN-ODT) 8 MG disintegrating tablet Take 8 mg by mouth every 8 (eight) hours as needed for nausea or vomiting (dissolve orally).     pantoprazole (PROTONIX) 40 MG tablet Take one tablet once every morning (Patient taking differently: Take 40 mg by mouth daily before breakfast.) 30 tablet 5   Pollen Extracts (PROSTAT PO) Take 30 mLs by mouth.     predniSONE (DELTASONE) 5 MG tablet Take 5 mg by mouth at bedtime.     psyllium (METAMUCIL) 58.6 % packet Take 1 packet by mouth daily. 30 each 12   sodium chloride (OCEAN) 0.65 % SOLN nasal spray Place 1 spray into both nostrils as needed for congestion.     No current facility-administered medications for this visit.    Allergies  Allergen Reactions   Penicillins Hives    Has patient had a PCN reaction causing immediate rash, facial/tongue/throat swelling, SOB or lightheadedness with hypotension: Yes  Has patient had a PCN reaction causing severe rash involving mucus membranes or skin necrosis: No  Has patient had a PCN reaction that required hospitalization: No  Has patient had a PCN reaction occurring within the last 10 years: No  If all of the above answers are "NO", then may proceed with Cephalosporin use.  Patient  has tolerated ceftriaxone in the recent past.  Other Reaction: Allergy    Has patient had a PCN reaction causing immediate rash, facial/tongue/throat swelling, SOB or lightheadedness with hypotension: Yes    Has patient had a PCN reaction causing severe rash involving mucus membranes or skin necrosis: No    Has patient had a PCN reaction that required hospitalization: No    Has patient had a PCN reaction occurring within the last 10 years: No    If all of the above answers are "NO", then may proceed with Cephalosporin use.    Patient has tolerated ceftriaxone in the recent past.   Sulfamethoxazole-Trimethoprim Nausea And Vomiting and Other (See Comments)    Stomach problems   Adalimumab Other (See Comments)    "Not effective"   Hydroxychloroquine Sulfate Other (See Comments)    "Plaquenil did not work after [employer]"   Meperidine Other (See Comments)    Cramping   Meperidine Hcl Other (See Comments)    Cramping    Methotrexate Nausea And Vomiting and Other (See Comments)    "Projectile vomiting"   Penicillin G Sodium  Hives   Phentermine-Topiramate Nausea Only and Other (See Comments)    "Nausea and dizziness & on re-trial, mental side effects so really cannot take it   Sulfa Antibiotics Nausea And Vomiting and Other (See Comments)    Stomach problems/GI upset, also   Sulfonamide Derivatives Nausea And Vomiting and Other (See Comments)    "Stomach problems"    Diagnoses:  No diagnosis found.  Plan of Care: ***   Marcelina Morel, PhD

## 2022-06-25 ENCOUNTER — Other Ambulatory Visit: Payer: Self-pay

## 2022-06-25 ENCOUNTER — Inpatient Hospital Stay (HOSPITAL_BASED_OUTPATIENT_CLINIC_OR_DEPARTMENT_OTHER): Payer: Medicare Other | Admitting: Hematology and Oncology

## 2022-06-25 ENCOUNTER — Inpatient Hospital Stay: Payer: Medicare Other | Attending: Hematology and Oncology

## 2022-06-25 VITALS — BP 120/47 | HR 89 | Temp 97.8°F | Resp 16 | Ht 65.0 in | Wt 147.8 lb

## 2022-06-25 DIAGNOSIS — D500 Iron deficiency anemia secondary to blood loss (chronic): Secondary | ICD-10-CM | POA: Diagnosis not present

## 2022-06-25 DIAGNOSIS — Z79899 Other long term (current) drug therapy: Secondary | ICD-10-CM | POA: Diagnosis not present

## 2022-06-25 DIAGNOSIS — F32A Depression, unspecified: Secondary | ICD-10-CM | POA: Diagnosis not present

## 2022-06-25 DIAGNOSIS — D509 Iron deficiency anemia, unspecified: Secondary | ICD-10-CM | POA: Diagnosis not present

## 2022-06-25 DIAGNOSIS — Z87891 Personal history of nicotine dependence: Secondary | ICD-10-CM | POA: Insufficient documentation

## 2022-06-25 LAB — CBC WITH DIFFERENTIAL (CANCER CENTER ONLY)
Abs Immature Granulocytes: 0.17 10*3/uL — ABNORMAL HIGH (ref 0.00–0.07)
Basophils Absolute: 0.1 10*3/uL (ref 0.0–0.1)
Basophils Relative: 1 %
Eosinophils Absolute: 0.2 10*3/uL (ref 0.0–0.5)
Eosinophils Relative: 2 %
HCT: 38.1 % (ref 36.0–46.0)
Hemoglobin: 12.1 g/dL (ref 12.0–15.0)
Immature Granulocytes: 2 %
Lymphocytes Relative: 23 %
Lymphs Abs: 2.6 10*3/uL (ref 0.7–4.0)
MCH: 28.5 pg (ref 26.0–34.0)
MCHC: 31.8 g/dL (ref 30.0–36.0)
MCV: 89.9 fL (ref 80.0–100.0)
Monocytes Absolute: 0.8 10*3/uL (ref 0.1–1.0)
Monocytes Relative: 7 %
Neutro Abs: 7.6 10*3/uL (ref 1.7–7.7)
Neutrophils Relative %: 65 %
Platelet Count: 355 10*3/uL (ref 150–400)
RBC: 4.24 MIL/uL (ref 3.87–5.11)
RDW: 14.8 % (ref 11.5–15.5)
WBC Count: 11.3 10*3/uL — ABNORMAL HIGH (ref 4.0–10.5)
nRBC: 0 % (ref 0.0–0.2)

## 2022-06-25 LAB — CMP (CANCER CENTER ONLY)
ALT: 11 U/L (ref 0–44)
AST: 12 U/L — ABNORMAL LOW (ref 15–41)
Albumin: 3.4 g/dL — ABNORMAL LOW (ref 3.5–5.0)
Alkaline Phosphatase: 65 U/L (ref 38–126)
Anion gap: 8 (ref 5–15)
BUN: 27 mg/dL — ABNORMAL HIGH (ref 8–23)
CO2: 25 mmol/L (ref 22–32)
Calcium: 9.5 mg/dL (ref 8.9–10.3)
Chloride: 108 mmol/L (ref 98–111)
Creatinine: 1.02 mg/dL — ABNORMAL HIGH (ref 0.44–1.00)
GFR, Estimated: 56 mL/min — ABNORMAL LOW (ref >60)
Glucose, Bld: 94 mg/dL (ref 70–99)
Potassium: 4.5 mmol/L (ref 3.5–5.1)
Sodium: 141 mmol/L (ref 135–145)
Total Bilirubin: 0.4 mg/dL (ref 0.3–1.2)
Total Protein: 6.4 g/dL — ABNORMAL LOW (ref 6.5–8.1)

## 2022-06-25 LAB — IRON AND IRON BINDING CAPACITY (CC-WL,HP ONLY)
Iron: 61 ug/dL (ref 28–170)
Saturation Ratios: 24 % (ref 10.4–31.8)
TIBC: 251 ug/dL (ref 250–450)
UIBC: 190 ug/dL (ref 148–442)

## 2022-06-25 NOTE — Progress Notes (Signed)
[facility] [phone]   [phone]  PROGRESS NOTE  Patient Care Team: Pcp, No as PCP - General Hallows, Rhett K, MD as Consulting Physician (Orthopedic Surgery)   CHIEF COMPLAINTS/PURPOSE OF CONSULTATION:  Microcytic anemia  HISTORY OF PRESENTING ILLNESS:  [name] 82 y.o. female returns for a follow up for iron deficiency anemia.   [name] is here for a follow-up by herself.  She came in a wheelchair.  She tells me that she does not like the nursing home very much.  She spends most of the time in her room.  She cannot sleep very well because her roommate coughs all night.  She tells me the food is terrible.  She feels like she is depressed.  She however denies any bleeding complaints.  She has been taking iron as recommended.  She tells me that her most bothersome complaint is severe pain from arthritis.  Rest of the pertinent 10 point ROS reviewed and negative  MEDICAL HISTORY:  Past Medical History:  Diagnosis Date   Acute pulmonary embolism (HCC)    b/l; dx'ed May '22   Asthma    Complication of anesthesia    Anesthesia told her years ago she got too cold during surgery   Diabetes mellitus without complication (HCC)    Emphysema    Fibromyalgia    Osteoporosis    Rheumatoid arthritis(714.0)    Sleep apnea    no cpap    SURGICAL HISTORY: Past Surgical History:  Procedure Laterality Date   ABDOMINAL HYSTERECTOMY     ANKLE FRACTURE SURGERY  2007   BREAST SURGERY     mammoplasty and then removed   COLONOSCOPY WITH PROPOFOL N/A 08/22/2020   Procedure: COLONOSCOPY WITH PROPOFOL;  Surgeon: Kerin Salen, MD;  Location: [facility];  Service: Gastroenterology;  Laterality: N/A;  If schedule availability permits, would like to add on upper endoscopy as well   ENTEROSCOPY N/A 11/11/2020   Procedure: ENTEROSCOPY;  Surgeon: Willis Modena, MD;  Location: [facility] ENDOSCOPY;  Service: Endoscopy;  Laterality: N/A;   ESOPHAGOGASTRODUODENOSCOPY  (EGD) WITH PROPOFOL N/A 08/22/2020   Procedure: ESOPHAGOGASTRODUODENOSCOPY (EGD) WITH PROPOFOL;  Surgeon: Kerin Salen, MD;  Location: [facility];  Service: Gastroenterology;  Laterality: N/A;   FOOT SURGERY     numerous   GIVENS CAPSULE STUDY N/A 10/28/2020   Procedure: GIVENS CAPSULE STUDY;  Surgeon: Kathi Der, MD;  Location: [facility];  Service: Gastroenterology;  Laterality: N/A;   HOT HEMOSTASIS N/A 08/22/2020   Procedure: HOT HEMOSTASIS (ARGON PLASMA COAGULATION/BICAP);  Surgeon: Kerin Salen, MD;  Location: [facility];  Service: Gastroenterology;  Laterality: N/A;   I & D EXTREMITY Right 10/26/2014   Procedure: IRRIGATION AND DEBRIDEMENT RIGHT KNEE ;  Surgeon: Ollen Gross, MD;  Location: [facility];  Service: Orthopedics;  Laterality: Right;   IRRIGATION AND DEBRIDEMENT KNEE Right 03/09/2017   Procedure: IRRIGATION AND DEBRIDEMENT RIGHT KNEE PRE-PATELLAR BURSA;  Surgeon: Jene Every, MD;  Location[facility] OR;  Service: Orthopedics;  Laterality: Right;   KNEE BURSECTOMY Right 09/16/2014   Procedure: EXCISON OF PRE PATELLA BURSA RIGHT KNEE ;  Surgeon: Ollen Gross, MD;  Location: [facility];  Service: Orthopedics;  Laterality: Right;   PARTIAL HYSTERECTOMY  1970   POLYPECTOMY  08/22/2020   Procedure: POLYPECTOMY;  Surgeon: Kerin Salen, MD;  Location: [facility];  Service: Gastroenterology;;   TOTAL KNEE ARTHROPLASTY  2005 / 2006   x2    SOCIAL HISTORY: Social History   Socioeconomic History   Marital  status: Divorced    Spouse name: Not on file   Number of children: Not on file   Years of education: Not on file   Highest education level: Not on file  Occupational History   Not on file  Tobacco Use   Smoking status: Former    Types: Cigarettes    Quit date: 03/11/1994    Years since quitting: 28.3   Smokeless tobacco: Never  Vaping Use   Vaping Use: Never used  Substance and Sexual Activity   Alcohol use: No   Drug use: No   Sexual activity: Not on file  Other Topics  Concern   Not on file  Social History Narrative   Not on file   Social Determinants of Health   Financial Resource Strain: Low Risk  (01/08/2019)   Overall Financial Resource Strain (CARDIA)    Difficulty of Paying Living Expenses: Not hard at all  Food Insecurity: No Food Insecurity (09/12/2020)   Hunger Vital Sign    Worried About Running Out of Food in the Last Year: Never true    Ran Out of Food in the Last Year: Never true  Transportation Needs: Unmet Transportation Needs (09/12/2020)   PRAPARE - Transportation    Lack of Transportation (Medical): Yes    Lack of Transportation (Non-Medical): Yes  Physical Activity: Inactive (01/08/2019)   Exercise Vital Sign    Days of Exercise per Week: 0 days    Minutes of Exercise per Session: 0 min  Stress: Stress Concern Present (01/08/2019)   [employer] of Occupational Health - Occupational Stress Questionnaire    Feeling of Stress : To some extent  Social Connections: Not on file  Intimate Partner Violence: Not At Risk (01/08/2019)   Humiliation, Afraid, Rape, and Kick questionnaire    Fear of Current or Ex-Partner: No    Emotionally Abused: No    Physically Abused: No    Sexually Abused: No    FAMILY HISTORY: Family History  Problem Relation Age of Onset   Hypertension Other    Diabetes Other    Stroke Other    Diabetes Mother    Diabetes Father    Diabetes Sister    Diabetes Maternal Aunt    Cancer Paternal Uncle    Cancer Paternal Grandmother    Breast cancer Paternal Grandmother     ALLERGIES:  is allergic to penicillins, sulfamethoxazole-trimethoprim, adalimumab, hydroxychloroquine sulfate, meperidine, meperidine hcl, methotrexate, penicillin g sodium, phentermine-topiramate, sulfa antibiotics, and sulfonamide derivatives.  MEDICATIONS:  Current Outpatient Medications  Medication Sig Dispense Refill   acetaminophen (TYLENOL) 500 MG tablet Take 1,000 mg by mouth every 6 (six) hours as needed for mild pain (or  headaches).     ascorbic acid (VITAMIN C) 500 MG tablet Take 500 mg by mouth 2 (two) times daily.     atorvastatin (LIPITOR) 20 MG tablet Take 20 mg by mouth daily.     azelastine (OPTIVAR) 0.05 % ophthalmic solution Place 1 drop into both eyes daily.     budesonide (PULMICORT) 0.25 MG/2ML nebulizer solution Take 2 mLs (0.25 mg total) by nebulization 2 (two) times daily. 120 mL 0   DULoxetine (CYMBALTA) 60 MG capsule Take 60 mg by mouth at bedtime.      ezetimibe (ZETIA) 10 MG tablet Take 10 mg by mouth at bedtime.      ferrous sulfate 325 (65 FE) MG tablet 1 tablet     folic acid (FOLVITE) 1 MG tablet Take 1 mg by mouth daily.  HYDROcodone-acetaminophen (NORCO) 10-325 MG tablet Take 1 tablet by mouth 3 (three) times daily as needed.     HYDROcodone-acetaminophen (NORCO/VICODIN) 5-325 MG tablet Take 1 tablet by mouth 4 (four) times daily as needed for pain. (Patient not taking: Reported on 11/27/2021)     hydrocortisone ointment 0.5 % Apply 1 Application topically 2 (two) times daily. Apply to hips     insulin glargine (LANTUS) 100 unit/mL SOPN Inject 8 Units into the skin at bedtime.     ipratropium-albuterol (DUONEB) 0.5-2.5 (3) MG/3ML SOLN Take 3 mLs by nebulization every 6 (six) hours as needed (wheezing).     lidocaine (ASPERCREME LIDOCAINE) 4 % Place 1 patch onto the skin daily. To rt shoulder     loperamide (IMODIUM) 2 MG capsule Take by mouth as needed for diarrhea or loose stools.     metFORMIN (GLUCOPHAGE) 850 MG tablet Take 850 mg by mouth 2 (two) times daily.     metoprolol tartrate (LOPRESSOR) 25 MG tablet Take 25 mg by mouth daily.     mirabegron ER (MYRBETRIQ) 25 MG TB24 tablet Take 25 mg by mouth daily.     NOVOLIN 70/30 KWIKPEN (70-30) 100 UNIT/ML KwikPen Inject 10 Units into the skin 2 (two) times daily. (Patient not taking: Reported on 11/27/2021)     Olopatadine HCl (PATADAY) 0.7 % SOLN Place 1 drop into both eyes 2 (two) times daily.     ondansetron (ZOFRAN-ODT) 8 MG  disintegrating tablet Take 8 mg by mouth every 8 (eight) hours as needed for nausea or vomiting (dissolve orally).     pantoprazole (PROTONIX) 40 MG tablet Take one tablet once every morning (Patient taking differently: Take 40 mg by mouth daily before breakfast.) 30 tablet 5   Pollen Extracts (PROSTAT PO) Take 30 mLs by mouth.     predniSONE (DELTASONE) 5 MG tablet Take 5 mg by mouth at bedtime.     psyllium (METAMUCIL) 58.6 % packet Take 1 packet by mouth daily. 30 each 12   sodium chloride (OCEAN) 0.65 % SOLN nasal spray Place 1 spray into both nostrils as needed for congestion.     No current facility-administered medications for this visit.    PHYSICAL EXAMINATION: ECOG PERFORMANCE STATUS: 2 - Symptomatic, <50% confined to bed  Vitals:   06/25/22 1515  BP: (!) 120/47  Pulse: 89  Resp: 16  Temp: 97.8 F (36.6 C)  SpO2: 100%    Filed Weights   06/25/22 1515  Weight: 147 lb 12.8 oz (67 kg)     GENERAL: female in NAD, elderly came in a wheelchair. Pulmonary/Chest: Effort normal and breath sounds normal. No respiratory distress. No wheezes. No rales.  Musculoskeletal: Severe arthritis Skin: Skin is warm and dry. No rash noted. Not diaphoretic. No erythema. No pallor.  Psychiatric: Mood, memory and judgment normal.    LABORATORY DATA:  I have reviewed the data as listed    Latest Ref Rng & Units 06/25/2022    2:58 PM 12/31/2021    9:52 AM 12/31/2021    9:41 AM  CBC  WBC 4.0 - 10.5 K/uL 11.3  7.3    Hemoglobin 12.0 - 15.0 g/dL 78.2  95.6  21.3   Hematocrit 36.0 - 46.0 % 38.1  34.9  32.0   Platelets 150 - 400 K/uL 355  307         Latest Ref Rng & Units 12/31/2021    9:52 AM 12/31/2021    9:41 AM 11/27/2021   10:19 AM  CMP  Glucose 70 - 99 mg/dL 459  977  414   BUN 8 - 23 mg/dL 19  18  25    Creatinine 0.44 - 1.00 mg/dL 2.39  5.32  0.23   Sodium 135 - 145 mmol/L 141  141  139   Potassium 3.5 - 5.1 mmol/L 5.2  5.0  4.5   Chloride 98 - 111 mmol/L 109  106  110    CO2 22 - 32 mmol/L 24   24   Calcium 8.9 - 10.3 mg/dL 8.8   8.7   Total Protein 6.5 - 8.1 g/dL 6.4   5.9   Total Bilirubin 0.3 - 1.2 mg/dL 0.5   0.4   Alkaline Phos 38 - 126 U/L 67   71   AST 15 - 41 U/L 12   11   ALT 0 - 44 U/L 11   13    ASSESSMENT & PLAN [name] is a 82 y.o. female who presents for a follow up for iron deficiency anemia.   #Iron deficiency 2/2 GI bleed.  --Admitted from 10/27/20-10/30/20 due to acute blood loss anemia. Underwent capsule endoscopy that showed small bowel erosions, small bowel AVM. Eliquis was discontinued that hospital stay after discussion pulmonary as CT chest showed resolution of thrombus --Readmitted from 11/07/2020-11/14/2020 due anemia with rectal bleeding. Hgb dropped from 8 to 5.3. She received 2 units of PRBC. Undrewent small bowel enteroscopy that was nonrevealing. Received IV ferrlecit 250 mg on 11/12/2020.  --Most recently received IV venofer 200 mg once weekly x 5 doses from 04/10/2021-05/08/2021.  --Labs from today show normal hemoglobin, iron panel and ferritin pending.  At this time there appears to be no concern for ongoing GI bleeding.  No concern for iron infusion.  She will continue oral iron and return to clinic in 6 months.  #Joint pain: --Likely secondary to OA and RA.  --She will follow-up with [facility]ology  # Depression,  --Due to dissatisfaction of living at nursing home and poor relationship with her son. -- She denies any active suicidal or homicidal intention. -- She has applied for a wait list at a different nursing facility.  She was also encouraged to discuss this with her provider at the facility. Her nurse practitioner has also previously recommended referral to psychology.    No orders of the defined types were placed in this encounter.   All questions were answered. The patient knows to call the clinic with any problems, questions or concerns.  I have spent a total of 30 minutes  minutes of face-to-face and non-face-to-face time, preparing to see the patient,  performing a medically appropriate examination, counseling and educating the patient, ordering medications/tests, communicating with other health care professionals, documenting clinical information in the electronic health record, and care coordination.

## 2022-06-26 ENCOUNTER — Telehealth: Payer: Self-pay | Admitting: Hematology and Oncology

## 2022-06-26 LAB — FERRITIN: Ferritin: 128 ng/mL (ref 11–307)

## 2022-06-26 NOTE — Telephone Encounter (Signed)
Spoke with patient confirming upcoming appointment  

## 2022-07-17 ENCOUNTER — Ambulatory Visit (INDEPENDENT_AMBULATORY_CARE_PROVIDER_SITE_OTHER): Payer: Medicare Other | Admitting: Psychology

## 2022-07-17 DIAGNOSIS — F331 Major depressive disorder, recurrent, moderate: Secondary | ICD-10-CM | POA: Diagnosis not present

## 2022-07-17 NOTE — Progress Notes (Signed)
[facility] Counselor Initial Adult Exam  Name: [name] Date: 07/17/2022 MRN: [id number] DOB: [birth year] PCP: Pcp, No  Time spent: 10:30-11:30a 60 minutes  Guardian/Payee:  N/A    Paperwork requested: No   Reason for Visit /Presenting Problem: depression;adjustment to living circumstances  Mental Status Exam: Appearance:   Casual     Behavior:  Appropriate  Motor:  Normal  Speech/Language:   Clear and Coherent  Affect:  Appropriate  Mood:  depressed  Thought process:  normal  Thought content:    WNL  Sensory/Perceptual disturbances:    WNL  Orientation:  oriented to person, place, and situation  Attention:  Good  Concentration:  Good  Memory:  WNL  Fund of knowledge:   Good  Insight:    Good  Judgment:   Good  Impulse Control:  Good    Reported Symptoms:  sadness, agitation, confusion, helplessness  Risk Assessment: Danger to Self:  No Self-injurious Behavior: No Danger to Others: No Duty to Warn:no Physical Aggression / Violence:No  Access to Firearms a concern: No  Gang Involvement:No  Patient / guardian was educated about steps to take if suicide or homicide risk level increases between visits: n/a While future psychiatric events cannot be accurately predicted, the patient does not currently require acute inpatient psychiatric care and does not currently meet [facility] involuntary commitment criteria.  Substance Abuse History: Current substance abuse: No     Past Psychiatric History:   Previous psychological history is significant for depression, hallucinations Outpatient Providers:unknown History of Psych Hospitalization: No  Psychological Testing:  no    Abuse History:  Victim of: No.,  N/A    Report needed: No. Victim of Neglect:No. Perpetrator of  N/A   Witness / Exposure to Domestic Violence: No   Protective Services Involvement: No  Witness to [employer] Violence:  No   Family History:   Family History  Problem Relation Age of Onset   Hypertension Other    Diabetes Other    Stroke Other    Diabetes Mother    Diabetes Father    Diabetes Sister    Diabetes Maternal Aunt    Cancer Paternal Uncle    Cancer Paternal Grandmother    Breast cancer Paternal Grandmother     Living situation: the patient lives in an assisted living facility  Sexual Orientation: Straight  Relationship Status: divorced  Name of spouse / other:unknown If a parent, number of children / ages:adult son (local) and daughter ([place]).  Support Systems: lives alone  Financial Stress:  Yes   Income/Employment/Disability: Neurosurgeon: No   Educational History: Education: Risk manager: Protestant  Any cultural differences that may affect / interfere with treatment:  not applicable   Recreation/Hobbies: unknown  Stressors: Health problems   Marital or family conflict    Strengths: Church  Barriers:  lack of social supports   Legal History: Pending legal issue / charges: The patient has no significant history of legal issues. History of legal issue / charges:  N/A  Medical History/Surgical History: reviewed Past Medical History:  Diagnosis Date   Acute pulmonary embolism (HCC)    b/l; dx'ed May '22   Asthma    Complication of anesthesia    Anesthesia told her years ago she got too cold during surgery   Diabetes mellitus without complication (HCC)    Emphysema  Fibromyalgia    Osteoporosis    Rheumatoid arthritis(714.0)    Sleep apnea    no cpap    Past Surgical History:  Procedure Laterality Date   ABDOMINAL HYSTERECTOMY     ANKLE FRACTURE SURGERY  2007   BREAST SURGERY     mammoplasty and then removed   COLONOSCOPY WITH PROPOFOL N/A 08/22/2020   Procedure: COLONOSCOPY WITH PROPOFOL;  Surgeon: Kerin Salen, MD;  Location: [facility] ENDOSCOPY;  Service: Gastroenterology;  Laterality: N/A;  If schedule  availability permits, would like to add on upper endoscopy as well   ENTEROSCOPY N/A 11/11/2020   Procedure: ENTEROSCOPY;  Surgeon: Willis Modena, MD;  Location: [facility] ENDOSCOPY;  Service: Endoscopy;  Laterality: N/A;   ESOPHAGOGASTRODUODENOSCOPY (EGD) WITH PROPOFOL N/A 08/22/2020   Procedure: ESOPHAGOGASTRODUODENOSCOPY (EGD) WITH PROPOFOL;  Surgeon: Kerin Salen, MD;  Location: [facility] ENDOSCOPY;  Service: Gastroenterology;  Laterality: N/A;   FOOT SURGERY     numerous   GIVENS CAPSULE STUDY N/A 10/28/2020   Procedure: GIVENS CAPSULE STUDY;  Surgeon: Kathi Der, MD;  Location: [facility] ENDOSCOPY;  Service: Gastroenterology;  Laterality: N/A;   HOT HEMOSTASIS N/A 08/22/2020   Procedure: HOT HEMOSTASIS (ARGON PLASMA COAGULATION/BICAP);  Surgeon: Kerin Salen, MD;  Location: [facility];  Service: Gastroenterology;  Laterality: N/A;   I & D EXTREMITY Right 10/26/2014   Procedure: IRRIGATION AND DEBRIDEMENT RIGHT KNEE ;  Surgeon: Ollen Gross, MD;  Location: [facility] ORS;  Service: Orthopedics;  Laterality: Right;   IRRIGATION AND DEBRIDEMENT KNEE Right 03/09/2017   Procedure: IRRIGATION AND DEBRIDEMENT RIGHT KNEE PRE-PATELLAR BURSA;  Surgeon: Jene Every, MD;  Location: [facility] OR;  Service: Orthopedics;  Laterality: Right;   KNEE BURSECTOMY Right 09/16/2014   Procedure: EXCISON OF PRE PATELLA BURSA RIGHT KNEE ;  Surgeon: Ollen Gross, MD;  Location: [facility] OR[facility];  Service: Orthopedics;  Laterality: Right;   PARTIAL HYSTERECTOMY  1970   POLYPECTOMY  08/22/2020   Procedure: POLYPECTOMY;  Surgeon: Kerin Salen, MD;  Location: [facility] ENDOS[facility];  Service: Gastroenterology;;   TOTAL KNEE ARTHROPLASTY  2005 / 2006   x2    Medications: Current Outpatient Medications  Medication Sig Dispense Refill   acetaminophen (TYLENOL) 500 MG tablet Take 1,000 mg by mouth every 6 (six) hours as needed for mild pain (or headaches).     ascorbic acid (VITAMIN C) 500 MG tablet Take 500 mg by mouth 2 (two) times daily.     atorvastatin  (LIPITOR) 20 MG tablet Take 20 mg by mouth daily.     azelastine (OPTIVAR) 0.05 % ophthalmic solution Place 1 drop into both eyes daily.     budesonide (PULMICORT) 0.25 MG/2ML nebulizer solution Take 2 mLs (0.25 mg total) by nebulization 2 (two) times daily. 120 mL 0   DULoxetine (CYMBALTA) 60 MG capsule Take 60 mg by mouth at bedtime.      ezetimibe (ZETIA) 10 MG tablet Take 10 mg by mouth at bedtime.      ferrous sulfate 325 (65 FE) MG tablet 1 tablet     folic acid (FOLVITE) 1 MG tablet Take 1 mg by mouth daily.     HYDROcodone-acetaminophen (NORCO) 10-325 MG tablet Take 1 tablet by mouth 3 (three) times daily as needed.     HYDROcodone-acetaminophen (NORCO/VICODIN) 5-325 MG tablet Take 1 tablet by mouth 4 (four) times daily as needed for pain. (Patient not taking: Reported on 11/27/2021)     hydrocortisone ointment 0.5 % Apply 1 Application topically 2 (two) times daily. Apply to hips     insulin  glargine (LANTUS) 100 unit/mL SOPN Inject 8 Units into the skin at bedtime.     ipratropium-albuterol (DUONEB) 0.5-2.5 (3) MG/3ML SOLN Take 3 mLs by nebulization every 6 (six) hours as needed (wheezing).     lidocaine (ASPERCREME LIDOCAINE) 4 % Place 1 patch onto the skin daily. To rt shoulder     loperamide (IMODIUM) 2 MG capsule Take by mouth as needed for diarrhea or loose stools.     metFORMIN (GLUCOPHAGE) 850 MG tablet Take 850 mg by mouth 2 (two) times daily.     metoprolol tartrate (LOPRESSOR) 25 MG tablet Take 25 mg by mouth daily.     mirabegron ER (MYRBETRIQ) 25 MG TB24 tablet Take 25 mg by mouth daily.     NOVOLIN 70/30 KWIKPEN (70-30) 100 UNIT/ML KwikPen Inject 10 Units into the skin 2 (two) times daily. (Patient not taking: Reported on 11/27/2021)     Olopatadine HCl (PATADAY) 0.7 % SOLN Place 1 drop into both eyes 2 (two) times daily.     ondansetron (ZOFRAN-ODT) 8 MG disintegrating tablet Take 8 mg by mouth every 8 (eight) hours as needed for nausea or vomiting (dissolve orally).      pantoprazole (PROTONIX) 40 MG tablet Take one tablet once every morning (Patient taking differently: Take 40 mg by mouth daily before breakfast.) 30 tablet 5   Pollen Extracts (PROSTAT PO) Take 30 mLs by mouth.     predniSONE (DELTASONE) 5 MG tablet Take 5 mg by mouth at bedtime.     psyllium (METAMUCIL) 58.6 % packet Take 1 packet by mouth daily. 30 each 12   sodium chloride (OCEAN) 0.65 % SOLN nasal spray Place 1 spray into both nostrils as needed for congestion.     No current facility-administered medications for this visit.    Allergies  Allergen Reactions   Penicillins Hives    Has patient had a PCN reaction causing immediate rash, facial/tongue/throat swelling, SOB or lightheadedness with hypotension: Yes  Has patient had a PCN reaction causing severe rash involving mucus membranes or skin necrosis: No  Has patient had a PCN reaction that required hospitalization: No  Has patient had a PCN reaction occurring within the last 10 years: No  If all of the above answers are "NO", then may proceed with Cephalosporin use.  Patient has tolerated ceftriaxone in the recent past.  Other Reaction: Allergy    Has patient had a PCN reaction causing immediate rash, facial/tongue/throat swelling, SOB or lightheadedness with hypotension: Yes    Has patient had a PCN reaction causing severe rash involving mucus membranes or skin necrosis: No    Has patient had a PCN reaction that required hospitalization: No    Has patient had a PCN reaction occurring within the last 10 years: No    If all of the above answers are "NO", then may proceed with Cephalosporin use.    Patient has tolerated ceftriaxone in the recent past.   Sulfamethoxazole-Trimethoprim Nausea And Vomiting and Other (See Comments)    Stomach problems   Adalimumab Other (See Comments)    "Not effective"   Hydroxychloroquine Sulfate Other (See Comments)    "Plaquenil did not work after [employer]"   Meperidine Other (See  Comments)    Cramping   Meperidine Hcl Other (See Comments)    Cramping    Methotrexate Nausea And Vomiting and Other (See Comments)    "Projectile vomiting"   Penicillin G Sodium Hives   Phentermine-Topiramate Nausea Only and Other (See Comments)    "Nausea  and dizziness & on re-trial, mental side effects so really cannot take it   Sulfa Antibiotics Nausea And Vomiting and Other (See Comments)    Stomach problems/GI upset, also   Sulfonamide Derivatives Nausea And Vomiting and Other (See Comments)    "Stomach problems"   Patient was seen in office for a face to face session Initial Session: Patient states she is living in Nursing Home [facility]) and referred herself for counseling. Says she has had RA for 40 years and Diabetes and Fibromyalgia. She says her dog died 2 days before Christmas and she was her best friend. Shadaya has lived alone for "probably 40 years". As the medical conditions progressed, she got weak and could not take care of herself. Was in and out of hospital a lot. She fell at home and could not get up and that precipitated moving into nursing home 1 1/2 years ago. Has a roommate at the nursing home. She was in a home across the street from her son. Now her grandson and his family live in her home. Since being at nursing home, she has struggled with depression. She says she is very independent and needs things to stay active. She feels a need to so something worth while.  She is also distressed about her family. Her son lives close and has been a good care-giver to her. But, she feels he got burned out of taking care of her. He told her he did not want to spend his life taking care of her. Her daughter lives in [place] and is a Engineer, civil (consulting), but she is uninvolved. Her son does not call to check on her and offer any help. Her son was recently remarried and Terrisa says she understands that he wants to focus on his new life. She struggles that "he is all I got" and she told him how she feels.  Part of her struggle is to determine if what she is feeling is "right". She says it is not hard for her to deterine what is truth and what is not.  She was married at age 28 and had her son at 76. Her parents were divorced and she lived with her dad from age [age]. As a child, she was so depressed from being separated from Dad, her mother sent her to her father. Her father used to make her admit to doing things she did not do. This left her questioning her own perceptions and she said this "scared her for life". She had quit HS because the principal told her "there is no place for people that are married". She went to work and got pregnant. She stayed married to her husband for 14 years. Husband was a long distance truck driver, so she was a single parent in many ways. She had a second child, took the GED, went to college and got an associates degree in accounting. After woring for a while, she went back to school and got a 4 year degree in psychology. 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 Had to retire due to the RA at around age 59. Went on disability. 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  She has tried numerous times to move back home, but her son will not allow it and tells her "if you move home you will be on your own". When she went to nursing home, her grandson and his wife moved in and cleaned  her house out. [place] never got to go through her things to make choices. They had a yard sale and never paid her anything, claiming they gave away most of the things. She struggles with whether she should be upset with son about his behavior or his lack of fairness. Yet, she feels a very special bond with son, because he represents feeling love for the first time.  She talked about her very strong faith, which she says is central to her life. 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 She does not,  however, feel she has a healthy balance in her life and that her faith is dominant.  Says that she was on pain meds that created terrible hallucinations. They made med changes and it has taken care of the hallucinations.  Goals/Treatment Plan: Patient is seeking to reduce depressive symptoms of sadness, powerlessness and helplessness. She is also seeking guidance on most appropriate ways to respond to family about her distress and feelings of abandonment. Will utilize insight oriented psychotherapy to better understand her history and the impact on her current emotional state. Will also incorporate some behavioral strategies to help her better manage her feelings. Medication management/consultation. Goal date is 12-24.     Diagnoses:  Major Depressive Disorder  Plan of Care: outpatient psychotherapy   Garrel Ridgel, PhD

## 2022-07-30 ENCOUNTER — Non-Acute Institutional Stay: Payer: Medicare Other | Admitting: Hospice

## 2022-07-30 DIAGNOSIS — J449 Chronic obstructive pulmonary disease, unspecified: Secondary | ICD-10-CM

## 2022-07-30 DIAGNOSIS — M069 Rheumatoid arthritis, unspecified: Secondary | ICD-10-CM

## 2022-07-30 DIAGNOSIS — F339 Major depressive disorder, recurrent, unspecified: Secondary | ICD-10-CM

## 2022-07-30 DIAGNOSIS — Z515 Encounter for palliative care: Secondary | ICD-10-CM

## 2022-07-30 NOTE — Progress Notes (Signed)
Therapist, nutritional Palliative Care Follow-Up Visit Telephone: [phone]  Fax: [phone]   PATIENT NAME: [name] [address] [place] [address]   [phone] (home)  DOB: 03-Dec-1940 MRN: [id number] PRIMARY CARE PROVIDER:    Dr Jaci Lazier  REFERRING PROVIDER:   Dr. Deborha Payment [facility] patient  RESPONSIBLE PARTY:    Contact Information     Name Relation Home Work Mobile   [place] Son   [phone]   Dravitz,Pam Daughter   [phone]        I met face to face with patient and family in [facility]. Palliative Care was asked to follow this patient by consultation request of  Dr. Deborha Payment to address advance care planning and complex medical decision making. This is follow-up visit.   Visit consisted of counseling and education dealing with the complex and emotionally intense issues of symptom management and palliative care in the setting of serious and potentially life-threatening illness.  Patient reports she is involved in a Bible class which is helping her to cope. Patient said she enjoys Bingo and getting food delivered to her from outside. Palliative care team will continue to support patient, patient's family, and medical team.                                     ASSESSMENT AND PLAN / RECOMMENDATIONS:   CODE STATUS:  DNR  Symptom Management/Plan: Rheumatoid arthritis: Continue Norco as ordered.  Follow-up with rheumatologist as planned.   Type 2 diabetes mellitus: Repeat A1c.  Monitor daily blood glucose as ordered.  Current A1c 6.2 02/12/2022.  Continue metformin and insulin as ordered.  No concentrated sweet.  Monitor for hypoglycemic/hyperglycemic s/s  COPD.  No recent exacerbation. Not on oxygen supplementation. Elevate head of bed. Continue  Duoneb and Budesonide as ordered. Avoid triggers. Encourage slow deep breathing, pursed lip breathing.   Depression: Patient saw psychiatrist Dr. Dellia Cloud 07/17/2022, reports no  change to duloxetine.  Continue duloxetine and as ordered.  Encourage socialization and participation in facility activities.  Routine CBC CMP. Psych consult as planned/needed.  Follow up Palliative Care Visit: Palliative care will continue to follow for complex medical decision making, advance care planning, and clarification of goals. Return 4 weeks or prn.   HOSPICE ELIGIBILITY/DIAGNOSIS: TBD  Chief Complaint: Follow-up palliative visit  HISTORY OF PRESENT ILLNESS:  [name] is a 82 y.o. year old female  with multiple morbidities requiring close monitoring, with high risk of complications and mortality: RA, chronic interstitial lung disease, h/o PE, dysphagia, DMII, type 2 diabetes mellitus, depression, osteoporosis, right knee bursitis, CKD3, anemia, fibromyalgia, hyperlipidemia, prior GI bleed, among others seen in palliative f/u.  Patient denies pain during visit, in no respiratory distress; reports doing better, more alert and more engaging with her environment since she stopped taking her scheduled Norco. She also added she would rather go back to Dr Waynard Edwards who was prescribing her Oxycodone with occasional prednisone blast. Education provided on safe use of narcotics to her to her accepting;  nursing with no concerns.   History obtained from review of EMR, discussion with primary team, and interview with family, facility staff/caregiver and/or Ms. Bibbs.  I reviewed available labs, medications, imaging, studies and related documents from the EMR.  Records reviewed and summarized above.    CURRENT PROBLEM LIST:  Patient Active Problem List   Diagnosis Date Noted   Iron  deficiency anemia 04/03/2021   Acute GI bleeding 11/10/2020   Rectal bleeding 11/09/2020   Acute encephalopathy 11/09/2020   Aortic atherosclerosis (HCC) 10/28/2020   Acute blood loss anemia 10/27/2020   AKI (acute kidney injury) (HCC) 10/27/2020   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   History of pulmonary embolism 10/27/2020    GI bleed 08/20/2020   Acute lower GI bleeding 08/19/2020   Acute pulmonary embolism (HCC) 07/21/2020   Hypoxia 05/23/2020   Acute hypoxemic respiratory failure (HCC) 05/22/2020   Rotator cuff arthropathy of left shoulder 12/13/2019   Pain in joint of right shoulder 12/10/2019   Pain in joint of left shoulder 12/10/2019   Hearing loss, sensorineural, 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   Tinnitus, left 01/08/2019   DM type 2, not at goal [facility]) 06/03/2018   Frequent infections 05/21/2018   Gangrene (HCC) 05/21/2018   Pain in finger 05/07/2018   Family history of aneurysm 05/07/2018   Diabetes mellitus due to underlying condition, controlled, with complication, without long-term current use of insulin (HCC)    Lower urinary tract infectious disease    Palpitations 01/17/2018   Dehydration 01/17/2018   Hypotension 01/17/2018   Symptom of blood in stool 01/17/2018   Diarrhea in adult patient 01/17/2018   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   Aftercare following joint replacement 08/21/2017   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 with presence of antibiotic-impregnated cement spacer 06/05/2017   Closed displaced transverse fracture of right patella 05/06/2017   Anemia 04/16/2017   Chronic interstitial lung disease (HCC) 04/16/2017   Chronic kidney disease (CKD), stage III (moderate) (HCC) 04/16/2017   Other hyperlipidemia 04/16/2017   Type 2 diabetes mellitus without complication, with long-term current use of insulin (HCC) 04/16/2017   Pain in right knee 03/25/2017   Aftercare 03/25/2017   Arthritis, septic, knee (HCC) 03/09/2017   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   Fibromyalgia 06/04/2016   High risk medication use 06/04/2016   Claw toe, acquired, left 01/29/2016   Rheumatoid arthritis involving both feet with negative rheumatoid factor (HCC) 01/29/2016   Septic prepatellar bursitis of right knee 10/26/2014   Prepatellar  bursitis of right knee 08/28/2014   DYSPHAGIA ORAL PHASE 06/18/2007   Asthma 05/14/2007   SLEEP APNEA 05/14/2007   Sleep apnea 05/14/2007   GERD 05/13/2007   Rheumatoid arthritis (HCC) 05/13/2007   OSTEOPOROSIS 05/13/2007   DIARRHEA, CHRONIC 05/13/2007    PAST MEDICAL HISTORY:  Active Ambulatory Problems    Diagnosis Date Noted   Asthma 05/14/2007   GERD 05/13/2007   Rheumatoid arthritis (HCC) 05/13/2007   OSTEOPOROSIS 05/13/2007   SLEEP APNEA 05/14/2007   DYSPHAGIA ORAL PHASE 06/18/2007   DIARRHEA, CHRONIC 05/13/2007   Prepatellar bursitis of right knee 08/28/2014   Septic prepatellar bursitis of right knee 10/26/2014   Arthritis, septic, knee (HCC) 03/09/2017   Palpitations 01/17/2018   Dehydration 01/17/2018   Hypotension 01/17/2018   Symptom of blood in stool 01/17/2018   Diarrhea in adult patient 01/17/2018   Diabetes mellitus due to underlying condition, controlled, with complication, without long-term current use of insulin (HCC)    Lower urinary tract infectious disease    Pain in finger 05/07/2018   Family history of aneurysm 05/07/2018   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 with presence of antibiotic-impregnated cement spacer 06/05/2017   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   Anemia 04/16/2017   Chronic interstitial lung disease (HCC) 04/16/2017  Chronic kidney disease (CKD), stage III (moderate) (HCC) 04/16/2017   Claw toe, acquired, left 01/29/2016   Closed displaced transverse fracture of right patella 05/06/2017   DM type 2, not at goal [facility]) 06/03/2018   Fibromyalgia 06/04/2016   Frequent infections 05/21/2018   Gangrene (HCC) 05/21/2018   Hearing loss, sensorineural, 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   High risk medication use 06/04/2016   Other hyperlipidemia 04/16/2017   Pain in right knee 03/25/2017   Rheumatoid arthritis involving both feet with negative  rheumatoid factor (HCC) 01/29/2016   Tinnitus, left 01/08/2019   Type 2 diabetes mellitus without complication, with long-term current use of insulin (HCC) 04/16/2017   Sleep apnea 05/14/2007   Aftercare following joint replacement 08/21/2017   Aftercare 03/25/2017   Pain in joint of right shoulder 12/10/2019   Pain in joint of left shoulder 12/10/2019   Rotator cuff arthropathy of left shoulder 12/13/2019   Acute hypoxemic respiratory failure (HCC) 05/22/2020   Hypoxia 05/23/2020   Acute pulmonary embolism (HCC) 07/21/2020   Acute lower GI bleeding 08/19/2020   GI bleed 08/20/2020   Acute blood loss anemia 10/27/2020   AKI (acute kidney injury) (HCC) 10/27/2020   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   History of pulmonary embolism 10/27/2020   Aortic atherosclerosis (HCC) 10/28/2020   Rectal bleeding 11/09/2020   Acute encephalopathy 11/09/2020   Acute GI bleeding 11/10/2020   Iron deficiency anemia 04/03/2021   Resolved Ambulatory Problems    Diagnosis Date Noted   No Resolved Ambulatory Problems   Past Medical History:  Diagnosis Date   Asthma    Complication of anesthesia    Diabetes mellitus without complication (HCC)    Emphysema    Osteoporosis    Rheumatoid arthritis(714.0)     SOCIAL HX:  Social History   Tobacco Use   Smoking status: Former    Types: Cigarettes    Quit date: 03/11/1994    Years since quitting: 28.4   Smokeless tobacco: Never  Substance Use Topics   Alcohol use: No     ALLERGIES:  Allergies  Allergen Reactions   Penicillins Hives    Has patient had a PCN reaction causing immediate rash, facial/tongue/throat swelling, SOB or lightheadedness with hypotension: Yes  Has patient had a PCN reaction causing severe rash involving mucus membranes or skin necrosis: No  Has patient had a PCN reaction that required hospitalization: No  Has patient had a PCN reaction occurring within the last 10 years: No  If all of the above answers are "NO", then may  proceed with Cephalosporin use.  Patient has tolerated ceftriaxone in the recent past.  Other Reaction: Allergy    Has patient had a PCN reaction causing immediate rash, facial/tongue/throat swelling, SOB or lightheadedness with hypotension: Yes    Has patient had a PCN reaction causing severe rash involving mucus membranes or skin necrosis: No    Has patient had a PCN reaction that required hospitalization: No    Has patient had a PCN reaction occurring within the last 10 years: No    If all of the above answers are "NO", then may proceed with Cephalosporin use.    Patient has tolerated ceftriaxone in the recent past.   Sulfamethoxazole-Trimethoprim Nausea And Vomiting and Other (See Comments)    Stomach problems   Adalimumab Other (See Comments)    "Not effective"   Hydroxychloroquine Sulfate Other (See Comments)    "Plaquenil did not work after awhile"   Meperidine Other (See Comments)    Cramping  Meperidine Hcl Other (See Comments)    Cramping    Methotrexate Nausea And Vomiting and Other (See Comments)    "Projectile vomiting"   Penicillin G Sodium Hives   Phentermine-Topiramate Nausea Only and Other (See Comments)    "Nausea and dizziness & on re-trial, mental side effects so really cannot take it   Sulfa Antibiotics Nausea And Vomiting and Other (See Comments)    Stomach problems/GI upset, also   Sulfonamide Derivatives Nausea And Vomiting and Other (See Comments)    "Stomach problems"      I spent  35 minutes providing this consultation; this includes time spent with patient/family, chart review and documentation. More than 50% of the time in this consultation was spent on counseling and coordinating communication   Thank you for the opportunity to participate in the care of Ms. Flannelly.  The palliative care team will continue to follow. Please call our office at [phone] if we can be of additional assistance.   Rosaura Carpenter, NP

## 2022-07-31 ENCOUNTER — Ambulatory Visit (INDEPENDENT_AMBULATORY_CARE_PROVIDER_SITE_OTHER): Payer: Medicare Other | Admitting: Psychology

## 2022-07-31 DIAGNOSIS — F331 Major depressive disorder, recurrent, moderate: Secondary | ICD-10-CM | POA: Diagnosis not present

## 2022-07-31 NOTE — Progress Notes (Signed)
[facility] Counselor Initial Adult Exam  Name: [name] Date: 07/31/2022 MRN: [id number] DOB: 1940/09/14 PCP: Pcp, No    Guardian/Payee:  N/A    Paperwork requested: No   Reason for Visit /Presenting Problem: depression;adjustment to living circumstances  Mental Status Exam: Appearance:   Casual     Behavior:  Appropriate  Motor:  Normal  Speech/Language:   Clear and Coherent  Affect:  Appropriate  Mood:  depressed  Thought process:  normal  Thought content:    WNL  Sensory/Perceptual disturbances:    WNL  Orientation:  oriented to person, place, and situation  Attention:  Good  Concentration:  Good  Memory:  WNL  Fund of knowledge:   Good  Insight:    Good  Judgment:   Good  Impulse Control:  Good    Reported Symptoms:  sadness, agitation, confusion, helplessness  Risk Assessment: Danger to Self:  No Self-injurious Behavior: No Danger to Others: No Duty to Warn:no Physical Aggression / Violence:No  Access to Firearms a concern: No  Gang Involvement:No  Patient / guardian was educated about steps to take if suicide or homicide risk level increases between visits: n/a While future psychiatric events cannot be accurately predicted, the patient does not currently require acute inpatient psychiatric care and does not currently meet [facility] involuntary commitment criteria.  Substance Abuse History: Current substance abuse: No     Past Psychiatric History:   Previous psychological history is significant for depression, hallucinations Outpatient Providers:unknown History of Psych Hospitalization: No  Psychological Testing:  no    Abuse History:  Victim of: No.,  N/A    Report needed: No. Victim of Neglect:No. Perpetrator of  N/A   Witness / Exposure to Domestic Violence: No   Protective Services Involvement: No  Witness to [employer] Violence:  No   Family History:  Family  History  Problem Relation Age of Onset   Hypertension Other    Diabetes Other    Stroke Other    Diabetes Mother    Diabetes Father    Diabetes Sister    Diabetes Maternal Aunt    Cancer Paternal Uncle    Cancer Paternal Grandmother    Breast cancer Paternal Grandmother     Living situation: the patient lives in an assisted living facility  Sexual Orientation: Straight  Relationship Status: divorced  Name of spouse / other:unknown If a parent, number of children / ages:adult son (local) and daughter ([place]).  Support Systems: lives alone  Financial Stress:  Yes   Income/Employment/Disability: Neurosurgeon: No   Educational History: Education: Risk manager: Protestant  Any cultural differences that may affect / interfere with treatment:  not applicable   Recreation/Hobbies: unknown  Stressors: Health problems   Marital or family conflict    Strengths: Church  Barriers:  lack of social supports   Legal History: Pending legal issue / charges: The patient has no significant history of legal issues. History of legal issue / charges:  N/A  Medical History/Surgical History: reviewed Past Medical History:  Diagnosis Date   Acute pulmonary embolism (HCC)    b/l; dx'ed May '22   Asthma    Complication of anesthesia    Anesthesia told her years ago she got too cold during surgery  Diabetes mellitus without complication (HCC)    Emphysema    Fibromyalgia    Osteoporosis    Rheumatoid arthritis(714.0)    Sleep apnea    no cpap    Past Surgical History:  Procedure Laterality Date   ABDOMINAL HYSTERECTOMY     ANKLE FRACTURE SURGERY  2007   BREAST SURGERY     mammoplasty and then removed   COLONOSCOPY WITH PROPOFOL N/A 08/22/2020   Procedure: COLONOSCOPY WITH PROPOFOL;  Surgeon: Kerin Salen, MD;  Location: [facility] ENDOSCOPY;  Service: Gastroenterology;  Laterality: N/A;  If schedule availability  permits, would like to add on upper endoscopy as well   ENTEROSCOPY N/A 11/11/2020   Procedure: ENTEROSCOPY;  Surgeon: Willis Modena, MD;  Location: [facility] ENDOSCOPY;  Service: Endoscopy;  Laterality: N/A;   ESOPHAGOGASTRODUODENOSCOPY (EGD) WITH PROPOFOL N/A 08/22/2020   Procedure: ESOPHAGOGASTRODUODENOSCOPY (EGD) WITH PROPOFOL;  Surgeon: Kerin Salen, MD;  Location: [facility] ENDOSCOPY;  Service: Gastroenterology;  Laterality: N/A;   FOOT SURGERY     numerous   GIVENS CAPSULE STUDY N/A 10/28/2020   Procedure: GIVENS CAPSULE STUDY;  Surgeon: Kathi Der, MD;  Location: [facility] ENDOSCOPY;  Service: Gastroenterology;  Laterality: N/A;   HOT HEMOSTASIS N/A 08/22/2020   Procedure: HOT HEMOSTASIS (ARGON PLASMA COAGULATION/BICAP);  Surgeon: Kerin Salen, MD;  Location: [facility];  Service: Gastroenterology;  Laterality: N/A;   I & D EXTREMITY Right 10/26/2014   Procedure: IRRIGATION AND DEBRIDEMENT RIGHT KNEE ;  Surgeon: Ollen Gross, MD;  Location: [facility] ORS;  Service: Orthopedics;  Laterality: Right;   IRRIGATION AND DEBRIDEMENT KNEE Right 03/09/2017   Procedure: IRRIGATION AND DEBRIDEMENT RIGHT KNEE PRE-PATELLAR BURSA;  Surgeon: Jene Every, MD;  Location[facility] OR;  Service: Orthopedics;  Laterality: Right;   KNEE BURSECTOMY Right 09/16/2014   Procedure: EXCISON OF PRE PATELLA BURSA RIGHT KNEE ;  Surgeon: Ollen Gross, MD;  Location: [facility] ORS;  Service: Orthopedics;  Laterality: Right;   PARTIAL HYSTERECTOMY  1970   POLYPECTOMY  08/22/2020   Procedure: POLYPECTOMY;  Surgeon: Kerin Salen, MD;  Location: [facility] ENDOSCOPY;  Service: Gastroenterology;;   TOTAL KNEE ARTHROPLASTY  2005 / 2006   x2    Medications: Current Outpatient Medications  Medication Sig Dispense Refill   acetaminophen (TYLENOL) 500 MG tablet Take 1,000 mg by mouth every 6 (six) hours as needed for mild pain (or headaches).     ascorbic acid (VITAMIN C) 500 MG tablet Take 500 mg by mouth 2 (two) times daily.     atorvastatin (LIPITOR) 20 MG  tablet Take 20 mg by mouth daily.     azelastine (OPTIVAR) 0.05 % ophthalmic solution Place 1 drop into both eyes daily.     budesonide (PULMICORT) 0.25 MG/2ML nebulizer solution Take 2 mLs (0.25 mg total) by nebulization 2 (two) times daily. 120 mL 0   DULoxetine (CYMBALTA) 60 MG capsule Take 60 mg by mouth at bedtime.      ezetimibe (ZETIA) 10 MG tablet Take 10 mg by mouth at bedtime.      ferrous sulfate 325 (65 FE) MG tablet 1 tablet     folic acid (FOLVITE) 1 MG tablet Take 1 mg by mouth daily.     HYDROcodone-acetaminophen (NORCO) 10-325 MG tablet Take 1 tablet by mouth 3 (three) times daily as needed.     HYDROcodone-acetaminophen (NORCO/VICODIN) 5-325 MG tablet Take 1 tablet by mouth 4 (four) times daily as needed for pain. (Patient not taking: Reported on 11/27/2021)     hydrocortisone ointment 0.5 % Apply 1 Application topically  2 (two) times daily. Apply to hips     insulin glargine (LANTUS) 100 unit/mL SOPN Inject 8 Units into the skin at bedtime.     ipratropium-albuterol (DUONEB) 0.5-2.5 (3) MG/3ML SOLN Take 3 mLs by nebulization every 6 (six) hours as needed (wheezing).     lidocaine (ASPERCREME LIDOCAINE) 4 % Place 1 patch onto the skin daily. To rt shoulder     loperamide (IMODIUM) 2 MG capsule Take by mouth as needed for diarrhea or loose stools.     metFORMIN (GLUCOPHAGE) 850 MG tablet Take 850 mg by mouth 2 (two) times daily.     metoprolol tartrate (LOPRESSOR) 25 MG tablet Take 25 mg by mouth daily.     mirabegron ER (MYRBETRIQ) 25 MG TB24 tablet Take 25 mg by mouth daily.     NOVOLIN 70/30 KWIKPEN (70-30) 100 UNIT/ML KwikPen Inject 10 Units into the skin 2 (two) times daily. (Patient not taking: Reported on 11/27/2021)     Olopatadine HCl (PATADAY) 0.7 % SOLN Place 1 drop into both eyes 2 (two) times daily.     ondansetron (ZOFRAN-ODT) 8 MG disintegrating tablet Take 8 mg by mouth every 8 (eight) hours as needed for nausea or vomiting (dissolve orally).     pantoprazole  (PROTONIX) 40 MG tablet Take one tablet once every morning (Patient taking differently: Take 40 mg by mouth daily before breakfast.) 30 tablet 5   Pollen Extracts (PROSTAT PO) Take 30 mLs by mouth.     predniSONE (DELTASONE) 5 MG tablet Take 5 mg by mouth at bedtime.     psyllium (METAMUCIL) 58.6 % packet Take 1 packet by mouth daily. 30 each 12   sodium chloride (OCEAN) 0.65 % SOLN nasal spray Place 1 spray into both nostrils as needed for congestion.     No current facility-administered medications for this visit.    Allergies  Allergen Reactions   Penicillins Hives    Has patient had a PCN reaction causing immediate rash, facial/tongue/throat swelling, SOB or lightheadedness with hypotension: Yes  Has patient had a PCN reaction causing severe rash involving mucus membranes or skin necrosis: No  Has patient had a PCN reaction that required hospitalization: No  Has patient had a PCN reaction occurring within the last 10 years: No  If all of the above answers are "NO", then may proceed with Cephalosporin use.  Patient has tolerated ceftriaxone in the recent past.  Other Reaction: Allergy    Has patient had a PCN reaction causing immediate rash, facial/tongue/throat swelling, SOB or lightheadedness with hypotension: Yes    Has patient had a PCN reaction causing severe rash involving mucus membranes or skin necrosis: No    Has patient had a PCN reaction that required hospitalization: No    Has patient had a PCN reaction occurring within the last 10 years: No    If all of the above answers are "NO", then may proceed with Cephalosporin use.    Patient has tolerated ceftriaxone in the recent past.   Sulfamethoxazole-Trimethoprim Nausea And Vomiting and Other (See Comments)    Stomach problems   Adalimumab Other (See Comments)    "Not effective"   Hydroxychloroquine Sulfate Other (See Comments)    "Plaquenil did not work after [employer]"   Meperidine Other (See Comments)     Cramping   Meperidine Hcl Other (See Comments)    Cramping    Methotrexate Nausea And Vomiting and Other (See Comments)    "Projectile vomiting"   Penicillin G Sodium Hives  Phentermine-Topiramate Nausea Only and Other (See Comments)    "Nausea and dizziness & on re-trial, mental side effects so really cannot take it   Sulfa Antibiotics Nausea And Vomiting and Other (See Comments)    Stomach problems/GI upset, also   Sulfonamide Derivatives Nausea And Vomiting and Other (See Comments)    "Stomach problems"   Patient was seen in office for a face to face session Initial Session: Patient states she is living in Nursing Home [facility]) and referred herself for counseling. Says she has had RA for 40 years and Diabetes and Fibromyalgia. She says her dog died 2 days before Christmas and she was her best friend. [name] has lived alone for "probably 40 years". As the medical conditions progressed, she got weak and could not take care of herself. Was in and out of hospital a lot. She fell at home and could not get up and that precipitated moving into nursing home 1 1/2 years ago. Has a roommate at the nursing home. She was in a home across the street from her son. Now her grandson and his family live in her home. Since being at nursing home, she has struggled with depression. She says she is very independent and needs things to stay active. She feels a need to so something worth while.  She is also distressed about her family. Her son lives close and has been a good care-giver to her. But, she feels he got burned out of taking care of her. He told her he did not want to spend his life taking care of her. Her daughter lives in [place] and is a Engineer, civil (consulting), but she is uninvolved. Her son does not call to check on her and offer any help. Her son was recently remarried and [name] says she understands that he wants to focus on his new life. She struggles that "he is all I got" and she told him how she feels. Part of her  struggle is to determine if what she is feeling is "right". She says it is not hard for her to deterine what is truth and what is not.  She was married at age 66 and had her son at 52. Her parents were divorced and she lived with her dad from age [age]. As a child, she was so depressed from being separated from Dad, her mother sent her to her father. Her father used to make her admit to doing things she did not do. This left her questioning her own perceptions and she said this "scared her for life". She had quit HS because the principal told her "there is no place for people that are married". She went to work and got pregnant. She stayed married to her husband for 14 years. Husband was a long distance truck driver, so she was a single parent in many ways. She had a second child, took the GED, went to college and got an associates degree in accounting. After woring for a while, she went back to school and got a 4 year degree in psychology. 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 Had to retire due to the RA at around age 29. Went on disability. 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  She has tried numerous times to move back home, but her son will not allow it and tells her "if you move home you will be on your own". When she went to  nursing home, her grandson and his wife moved in and cleaned her house out. [name] never got to go through her things to make choices. They had a yard sale and never paid her anything, claiming they gave away most of the things. She struggles with whether she should be upset with son about his behavior or his lack of fairness. Yet, she feels a very special bond with son, because he represents feeling love for the first time.  She talked about her very strong faith, which she says is central to her life. 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 She does not, however,  feel she has a healthy balance in her life and that her faith is dominant.  Says that she was on pain meds that created terrible hallucinations. They made med changes and it has taken care of the hallucinations.  Goals/Treatment Plan: Patient is seeking to reduce depressive symptoms of sadness, powerlessness and helplessness. She is also seeking guidance on most appropriate ways to respond to family about her distress and feelings of abandonment. Will utilize insight oriented psychotherapy to better understand her history and the impact on her current emotional state. Will also incorporate some behavioral strategies to help her better manage her feelings. Medication management/consultation.Goal date is 12-24.   Patient seen in office for face to face session.  Session Note: [name] says she has been in a "lot of pain" associated with the RA. If she takes a narcotic pain pill, she gets hallucinations. It has been so bad at times that she has stayed in bed for a week. She says part of that problem is her depression. Her depression has been worse this week. Because she cannot go home, she has a "death wish" at times. Does say she doesn't have a plan and it "is not God's plan for me". Says she would never hurt herself because it would be against God's wishes. She really doesn't like the way she is treated by the nursing home staff. She feels they treat residents like children. She sometimes loses her temper with the staff. We talked about the challenge of how to respond to inappropriate staff. Her son came to nursing home and brought her home for Mother's Day. Says he was very nice and supportive. Recognizes that her depression is situational, but wants to see if the anti-depressant can be tweaked to help. Told her we may get a psychiatric consultation to determine if a medication change is advised.  She said she needs to talk about the med nurse at the home. She thinks he flirts with her and she said the last  conversation she had with him was about her insulin shot. She gets the shot in her stomach and he makes jokes about it. She told him to stop and he said he will.       Diagnoses:  Major Depressive Disorder  Plan of Care: outpatient psychotherapy   Garrel Ridgel, PhD 10:35a-11:30a 55 minutes

## 2022-08-04 ENCOUNTER — Other Ambulatory Visit: Payer: Self-pay

## 2022-08-04 ENCOUNTER — Emergency Department (HOSPITAL_COMMUNITY)
Admission: EM | Admit: 2022-08-04 | Discharge: 2022-08-04 | Disposition: A | Discharge status: Other Acute Inpatient Hospital | Payer: Medicare Other | Attending: Emergency Medicine | Admitting: Emergency Medicine

## 2022-08-04 ENCOUNTER — Encounter (HOSPITAL_COMMUNITY): Payer: Self-pay | Admitting: Emergency Medicine

## 2022-08-04 DIAGNOSIS — J45909 Unspecified asthma, uncomplicated: Secondary | ICD-10-CM | POA: Diagnosis not present

## 2022-08-04 DIAGNOSIS — M0579 Rheumatoid arthritis with rheumatoid factor of multiple sites without organ or systems involvement: Secondary | ICD-10-CM

## 2022-08-04 DIAGNOSIS — M542 Cervicalgia: Secondary | ICD-10-CM

## 2022-08-04 HISTORY — DX: Unspecified osteoarthritis, unspecified site: M19.90

## 2022-08-04 LAB — CBC
HCT: 37.4 % (ref 36.0–46.0)
Hemoglobin: 11.5 g/dL — ABNORMAL LOW (ref 12.0–15.0)
MCH: 28.1 pg (ref 26.0–34.0)
MCHC: 30.7 g/dL (ref 30.0–36.0)
MCV: 91.4 fL (ref 80.0–100.0)
Platelets: 349 10*3/uL (ref 150–400)
RBC: 4.09 MIL/uL (ref 3.87–5.11)
RDW: 14.9 % (ref 11.5–15.5)
WBC: 11.5 10*3/uL — ABNORMAL HIGH (ref 4.0–10.5)
nRBC: 0 % (ref 0.0–0.2)

## 2022-08-04 LAB — BASIC METABOLIC PANEL
Anion gap: 8 (ref 5–15)
BUN: 25 mg/dL — ABNORMAL HIGH (ref 8–23)
CO2: 23 mmol/L (ref 22–32)
Calcium: 8.9 mg/dL (ref 8.9–10.3)
Chloride: 110 mmol/L (ref 98–111)
Creatinine, Ser: 0.85 mg/dL (ref 0.44–1.00)
GFR, Estimated: >60 mL/min (ref >60)
Glucose, Bld: 134 mg/dL — ABNORMAL HIGH (ref 70–99)
Potassium: 4.4 mmol/L (ref 3.5–5.1)
Sodium: 141 mmol/L (ref 135–145)

## 2022-08-04 MED ORDER — PREDNISONE 20 MG PO TABS
40.0000 mg | ORAL_TABLET | Freq: Once | ORAL | Status: AC
  Administered 2022-08-04: 40 mg via ORAL
  Filled 2022-08-04: qty 2

## 2022-08-04 MED ORDER — PREDNISONE 20 MG PO TABS: 40.0000 mg | ORAL_TABLET | Freq: Every day | ORAL | 0 refills | Status: AC

## 2022-08-04 MED ORDER — SODIUM CHLORIDE 0.9 % IV BOLUS
1000.0000 mL | Freq: Once | INTRAVENOUS | Status: AC
  Administered 2022-08-04: 1000 mL via INTRAVENOUS

## 2022-08-04 MED ORDER — KETOROLAC TROMETHAMINE 15 MG/ML IJ SOLN
15.0000 mg | Freq: Once | INTRAMUSCULAR | Status: AC
  Administered 2022-08-04: 15 mg via INTRAVENOUS
  Filled 2022-08-04: qty 1

## 2022-08-04 MED ORDER — OXYCODONE-ACETAMINOPHEN 5-325 MG PO TABS
1.0000 | ORAL_TABLET | Freq: Once | ORAL | Status: AC
  Administered 2022-08-04: 1 via ORAL
  Filled 2022-08-04: qty 1

## 2022-08-04 NOTE — Discharge Instructions (Signed)
Take the steroids I prescribed for the next 5 days instead of your baseline 5mg  daily. Then return to your normal dose. Please follow up with your rheumatologist at your earliest convenience to discuss future steroid bursts / chronic pain medication.

## 2022-08-04 NOTE — ED Triage Notes (Signed)
Patient presents from her facility due to sever neck and back pain since last night. Pain medication did not help.   Hx: Rheumatoid Arthritis   EMS vitals: 102/40 BP 102 HR 89% SPO2 on room air  96% SPO2 on 3 L 39 CAP 99.8 Temp 126 CBG

## 2022-08-04 NOTE — ED Notes (Signed)
Report given to Lupita Leash at receiving facility

## 2022-08-04 NOTE — ED Provider Notes (Signed)
[place] EMERGENCY DEPARTMENT AT [facility] Provider Note   CSN: [id number] Arrival date & time: 08/04/22  1355     History  Chief Complaint  Patient presents with   Neck Pain    [name] is a 82 y.o. female with past medical history significant for asthma, GERD, rheumatoid arthritis on chronic steroids, chronic interstitial lung disease, fibromyalgia who presents with concern for severe neck, back pain, generalized pain since last night.  Patient reports that she has a rheumatologist, previously been seeing them more regularly for prednisone bursts as needed for flares, as well has had a chronic prescription for Norco for severe pain, but reports that her care facility said that they do not provide any prescriptions for as needed narcotics.  Patient reports that she has not followed up with her rheumatologist recently.  She denies any chest pain, shortness of breath, fever, chills, nausea, vomiting, dysuria, hematuria.   Neck Pain      Home Medications Prior to Admission medications   Medication Sig Start Date End Date Taking? Authorizing Provider  predniSONE (DELTASONE) 20 MG tablet Take 2 tablets (40 mg total) by mouth daily. 08/04/22  Yes Rielynn Trulson H, PA-C  acetaminophen (TYLENOL) 500 MG tablet Take 1,000 mg by mouth every 6 (six) hours as needed for mild pain (or headaches).    [provider]  ascorbic acid (VITAMIN C) 500 MG tablet Take 500 mg by mouth 2 (two) times daily.    [provider]  atorvastatin (LIPITOR) 20 MG tablet Take 20 mg by mouth daily. 10/23/20   [provider]  azelastine (OPTIVAR) 0.05 % ophthalmic solution Place 1 drop into both eyes daily. 05/24/21   [provider]  budesonide (PULMICORT) 0.25 MG/2ML nebulizer solution Take 2 mLs (0.25 mg total) by nebulization 2 (two) times daily. 05/26/20 10/27/21  Lanae Boast, MD  DULoxetine (CYMBALTA) 60 MG capsule Take 60 mg by mouth at bedtime.     [provider]  ezetimibe (ZETIA) 10 MG tablet Take 10 mg by mouth at bedtime.  11/04/16   [provider]  ferrous sulfate 325 (65 FE) MG tablet 1 tablet    [provider]  folic acid (FOLVITE) 1 MG tablet Take 1 mg by mouth daily.    [provider]  HYDROcodone-acetaminophen (NORCO) 10-325 MG tablet Take 1 tablet by mouth 3 (three) times daily as needed. 11/12/21   [provider]  HYDROcodone-acetaminophen (NORCO/VICODIN) 5-325 MG tablet Take 1 tablet by mouth 4 (four) times daily as needed for pain. Patient not taking: Reported on 11/27/2021 06/08/21   [provider]  hydrocortisone ointment 0.5 % Apply 1 Application topically 2 (two) times daily. Apply to hips    [provider]  insulin glargine (LANTUS) 100 unit/mL SOPN Inject 8 Units into the skin at bedtime.    [provider]  ipratropium-albuterol (DUONEB) 0.5-2.5 (3) MG/3ML SOLN Take 3 mLs by nebulization every 6 (six) hours as needed (wheezing). 07/19/20   [provider]  lidocaine (ASPERCREME LIDOCAINE) 4 % Place 1 patch onto the skin daily. To rt shoulder    [provider]  loperamide (IMODIUM) 2 MG capsule Take by mouth as needed for diarrhea or loose stools.    [provider]  metFORMIN (GLUCOPHAGE) 850 MG tablet Take 850 mg by mouth 2 (two) times daily. 11/19/21   [provider]  metoprolol tartrate (LOPRESSOR) 25 MG tablet Take 25 mg by mouth daily. 06/12/21  [provider]  mirabegron ER (MYRBETRIQ) 25 MG TB24 tablet Take 25 mg by mouth daily.    [provider]  NOVOLIN 70/30 KWIKPEN (70-30) 100 UNIT/ML KwikPen Inject 10 Units into the skin 2 (two) times daily. Patient not taking: Reported on 11/27/2021 10/20/20   [provider]  Olopatadine HCl (PATADAY) 0.7 % SOLN Place 1 drop into both eyes 2 (two) times daily.    [provider]  ondansetron (ZOFRAN-ODT) 8 MG disintegrating tablet Take 8 mg  by mouth every 8 (eight) hours as needed for nausea or vomiting (dissolve orally). 10/24/20   [provider]  pantoprazole (PROTONIX) 40 MG tablet Take one tablet once every morning Patient taking differently: Take 40 mg by mouth daily before breakfast. 05/26/20   Lanae Boast, MD  Pollen Extracts (PROSTAT PO) Take 30 mLs by mouth.    [provider]  predniSONE (DELTASONE) 5 MG tablet Take 5 mg by mouth at bedtime.    [provider]  psyllium (METAMUCIL) 58.6 % packet Take 1 packet by mouth daily. 11/16/20   Rolly Salter, MD  sodium chloride (OCEAN) 0.65 % SOLN nasal spray Place 1 spray into both nostrils as needed for congestion.    [provider]      Allergies    Penicillins, Sulfamethoxazole-trimethoprim, Adalimumab, Hydroxychloroquine sulfate, Meperidine, Meperidine hcl, Methotrexate, Penicillin g sodium, Phentermine-topiramate, Sulfa antibiotics, and Sulfonamide derivatives    Review of Systems   Review of Systems  Musculoskeletal:  Positive for neck pain.  All other systems reviewed and are negative.   Physical Exam Updated Vital Signs BP 117/81   Pulse 93   Temp 98.1 F (36.7 C) (Oral)   Resp 17   SpO2 96%  Physical Exam Vitals and nursing note reviewed.  Constitutional:      General: She is not in acute distress.    Appearance: Normal appearance.  HENT:     Head: Normocephalic and atraumatic.  Eyes:     General:        Right eye: No discharge.        Left eye: No discharge.  Cardiovascular:     Rate and Rhythm: Normal rate and regular rhythm.     Heart sounds: No murmur heard.    No friction rub. No gallop.  Pulmonary:     Effort: Pulmonary effort is normal.     Breath sounds: Normal breath sounds.  Abdominal:     General: Bowel sounds are normal.     Palpations: Abdomen is soft.  Musculoskeletal:     Comments: Signs of some chronic return arthritis, with deformity throughout bilateral hands, tenderness midline cervical and  thoracic spine.  She has intact strength 5/5 bilateral upper and lower extremities  Skin:    General: Skin is warm and dry.     Capillary Refill: Capillary refill takes less than 2 seconds.  Neurological:     Mental Status: She is alert and oriented to person, place, and time.  Psychiatric:        Mood and Affect: Mood normal.        Behavior: Behavior normal.     ED Results / Procedures / Treatments   Labs (all labs ordered are listed, but only abnormal results are displayed) Labs Reviewed  CBC - Abnormal; Notable for the following components:      Result Value   WBC 11.5 (*)    Hemoglobin 11.5 (*)    All other components within normal limits  BASIC  METABOLIC PANEL - Abnormal; Notable for the following components:   Glucose, Bld 134 (*)    BUN 25 (*)    All other components within normal limits    EKG EKG Interpretation  Date/Time:  Sunday Aug 04 2022 14:36:09 EDT Ventricular Rate:  109 PR Interval:  116 QRS Duration: 106 QT Interval:  349 QTC Calculation: 470 R Axis:   -64 Text Interpretation: Sinus tachycardia Left anterior fascicular block Abnormal R-wave progression, late transition LVH with secondary repolarization abnormality SINCE LAST TRACING HEART RATE HAS INCREASED Confirmed by Rolan Bucco [phone]) on 08/04/2022 2:53:28 PM  Radiology No results found.  Procedures Procedures    Medications Ordered in ED Medications  sodium chloride 0.9 % bolus 1,000 mL (0 mLs Intravenous Stopped 08/04/22 1619)  ketorolac (TORADOL) 15 MG/ML injection 15 mg (15 mg Intravenous Given 08/04/22 1453)  oxyCODONE-acetaminophen (PERCOCET/ROXICET) 5-325 MG per tablet 1 tablet (1 tablet Oral Given 08/04/22 1454)  predniSONE (DELTASONE) tablet 40 mg (40 mg Oral Given 08/04/22 1616)    ED Course/ Medical Decision Making/ A&P                             Medical Decision Making Amount and/or Complexity of Data Reviewed Labs: ordered.  Risk Prescription drug management.   This  patient is a 82 y.o. female  who presents to the ED for concern of pain in neck, back,/all over.   Differential diagnoses prior to evaluation: The emergent differential diagnosis includes, but is not limited to, dehydration, electrolyte abnormality, return arthritis flare, versus other.. This is not an exhaustive differential.   Past Medical History / Co-morbidities / Social History: asthma, GERD, rheumatoid arthritis on chronic steroids, chronic interstitial lung disease, fibromyalgia  Physical Exam: Physical exam performed. The pertinent findings include: Patient with some chronic seeming pain of the neck, back.  She arrives mildly tachycardic but tachycardia improved after fluids, pain control.  She had dry mucous membranes initially.  She has diffuse chronic rheumatoid arthritis changes especially of the hands, wrists, but no evidence of acute fracture, dislocation today.  She remains neurovascularly intact throughout.  She can ambulate without difficulty at her baseline.  Lab Tests/Imaging studies: I personally interpreted labs/imaging and the pertinent results include: CBC with mild leukocytosis, white blood cells 11.5.  Her BMP is overall unremarkable, mildly elevated BUN at 25 may suggest some mild dehydration.  Cardiac monitoring: EKG obtained and interpreted by my attending physician which shows: Sinus tachycardia   Medications: I ordered medication including fluids, prednisone, and pain control with Toradol, Percocet x 1.  I have reviewed the patients home medicines and have made adjustments as needed.   Disposition: After consideration of the diagnostic results and the patients response to treatment, I feel that patient would benefit from a steroid burst for any acute rheumatoid arthritis flare, but I recommend that she follow-up closely with her rheumatologist, primary care doctor regarding ongoing pain control.   emergency department workup does not suggest an emergent condition  requiring admission or immediate intervention beyond what has been performed at this time. The plan is: as above. The patient is safe for discharge and has been instructed to return immediately for worsening symptoms, change in symptoms or any other concerns.  Final Clinical Impression(s) / ED Diagnoses Final diagnoses:  Rheumatoid arthritis involving multiple sites with positive rheumatoid factor (HCC)  Neck pain    Rx / DC Orders ED Discharge Orders  Ordered    predniSONE (DELTASONE) 20 MG tablet  Daily        08/04/22 1558              Avonne Berkery, [place] H, PA-C 08/04/22 1628    Rolan Bucco, MD 08/04/22 2207

## 2022-09-05 ENCOUNTER — Ambulatory Visit (INDEPENDENT_AMBULATORY_CARE_PROVIDER_SITE_OTHER): Payer: Medicare Other | Admitting: Psychology

## 2022-09-05 DIAGNOSIS — F331 Major depressive disorder, recurrent, moderate: Secondary | ICD-10-CM | POA: Diagnosis not present

## 2022-09-05 NOTE — Progress Notes (Signed)
[facility] Counselor Initial Adult Exam  Name: [name] Date: 09/05/2022 MRN: [id number] DOB: October 02, 1940 PCP: Pcp, No    Guardian/Payee:  N/A    Paperwork requested: No   Reason for Visit /Presenting Problem: depression;adjustment to living circumstances  Mental Status Exam: Appearance:   Casual     Behavior:  Appropriate  Motor:  Normal  Speech/Language:   Clear and Coherent  Affect:  Appropriate  Mood:  depressed  Thought process:  normal  Thought content:    WNL  Sensory/Perceptual disturbances:    WNL  Orientation:  oriented to person, place, and situation  Attention:  Good  Concentration:  Good  Memory:  WNL  Fund of knowledge:   Good  Insight:    Good  Judgment:   Good  Impulse Control:  Good    Reported Symptoms:  sadness, agitation, confusion, helplessness  Risk Assessment: Danger to Self:  No Self-injurious Behavior: No Danger to Others: No Duty to Warn:no Physical Aggression / Violence:No  Access to Firearms a concern: No  Gang Involvement:No  Patient / guardian was educated about steps to take if suicide or homicide risk level increases between visits: n/a While future psychiatric events cannot be accurately predicted, the patient does not currently require acute inpatient psychiatric care and does not currently meet [facility] involuntary commitment criteria.  Substance Abuse History: Current substance abuse: No     Past Psychiatric History:   Previous psychological history is significant for depression, hallucinations Outpatient Providers:unknown History of Psych Hospitalization: No  Psychological Testing:  no    Abuse History:  Victim of: No.,  N/A    Report needed: No. Victim of Neglect:No. Perpetrator of  N/A   Witness / Exposure to Domestic Violence: No   Protective Services Involvement: No  Witness to [employer] Violence:  No    Family History:  Family History  Problem Relation Age of Onset   Hypertension Other    Diabetes Other    Stroke Other    Diabetes Mother    Diabetes Father    Diabetes Sister    Diabetes Maternal Aunt    Cancer Paternal Uncle    Cancer Paternal Grandmother    Breast cancer Paternal Grandmother     Living situation: the patient lives in an assisted living facility  Sexual Orientation: Straight  Relationship Status: divorced  Name of spouse / other:unknown If a parent, number of children / ages:adult son (local) and daughter ([place]).  Support Systems: lives alone  Financial Stress:  Yes   Income/Employment/Disability: Neurosurgeon: No   Educational History: Education: Risk manager: Protestant  Any cultural differences that may affect / interfere with treatment:  not applicable   Recreation/Hobbies: unknown  Stressors: Health problems   Marital or family conflict    Strengths: Church  Barriers:  lack of social supports   Legal History: Pending legal issue / charges: The patient has no significant history of legal issues. History of legal issue / charges:  N/A  Medical History/Surgical History: reviewed Past Medical History:  Diagnosis Date   Acute pulmonary embolism (HCC)    b/l; dx'ed May '22   Arthritis    Asthma  Complication of anesthesia    Anesthesia told her years ago she got too cold during surgery   Diabetes mellitus without complication (HCC)    Emphysema    Fibromyalgia    Osteoporosis    Rheumatoid arthritis(714.0)    Sleep apnea    no cpap    Past Surgical History:  Procedure Laterality Date   ABDOMINAL HYSTERECTOMY     ANKLE FRACTURE SURGERY  2007   BREAST SURGERY     mammoplasty and then removed   COLONOSCOPY WITH PROPOFOL N/A 08/22/2020   Procedure: COLONOSCOPY WITH PROPOFOL;  Surgeon: Kerin Salen, MD;  Location: [facility];  Service: Gastroenterology;   Laterality: N/A;  If schedule availability permits, would like to add on upper endoscopy as well   ENTEROSCOPY N/A 11/11/2020   Procedure: ENTEROSCOPY;  Surgeon: Willis Modena, MD;  Location: [facility] ENDOSCOPY;  Service: Endoscopy;  Laterality: N/A;   ESOPHAGOGASTRODUODENOSCOPY (EGD) WITH PROPOFOL N/A 08/22/2020   Procedure: ESOPHAGOGASTRODUODENOSCOPY (EGD) WITH PROPOFOL;  Surgeon: Kerin Salen, MD;  Location: [facility];  Service: Gastroenterology;  Laterality: N/A;   FOOT SURGERY     numerous   GIVENS CAPSULE STUDY N/A 10/28/2020   Procedure: GIVENS CAPSULE STUDY;  Surgeon: Kathi Der, MD;  Location: [facility];  Service: Gastroenterology;  Laterality: N/A;   HOT HEMOSTASIS N/A 08/22/2020   Procedure: HOT HEMOSTASIS (ARGON PLASMA COAGULATION/BICAP);  Surgeon: Kerin Salen, MD;  Location: [facility];  Service: Gastroenterology;  Laterality: N/A;   I & D EXTREMITY Right 10/26/2014   Procedure: IRRIGATION AND DEBRIDEMENT RIGHT KNEE ;  Surgeon: Ollen Gross, MD;  Location: [facility] ORS;  Service: Orthopedics;  Laterality: Right;   IRRIGATION AND DEBRIDEMENT KNEE Right 03/09/2017   Procedure: IRRIGATION AND DEBRIDEMENT RIGHT KNEE PRE-PATELLAR BURSA;  Surgeon: Jene Every, MD;  Location: [facility] OR;  Service: Orthopedics;  Laterality: Right;   KNEE BURSECTOMY Right 09/16/2014   Procedure: EXCISON OF PRE PATELLA BURSA RIGHT KNEE ;  Surgeon: Ollen Gross, MD;  Location: [facility];  Service: Orthopedics;  Laterality: Right;   PARTIAL HYSTERECTOMY  1970   POLYPECTOMY  08/22/2020   Procedure: POLYPECTOMY;  Surgeon: Kerin Salen, MD;  Location: [facility];  Service: Gastroenterology;;   TOTAL KNEE ARTHROPLASTY  2005 / 2006   x2    Medications: Current Outpatient Medications  Medication Sig Dispense Refill   acetaminophen (TYLENOL) 500 MG tablet Take 1,000 mg by mouth every 6 (six) hours as needed for mild pain (or headaches).     ascorbic acid (VITAMIN C) 500 MG tablet Take 500 mg by mouth 2 (two) times  daily.     atorvastatin (LIPITOR) 20 MG tablet Take 20 mg by mouth daily.     azelastine (OPTIVAR) 0.05 % ophthalmic solution Place 1 drop into both eyes daily.     budesonide (PULMICORT) 0.25 MG/2ML nebulizer solution Take 2 mLs (0.25 mg total) by nebulization 2 (two) times daily. 120 mL 0   DULoxetine (CYMBALTA) 60 MG capsule Take 60 mg by mouth at bedtime.      ezetimibe (ZETIA) 10 MG tablet Take 10 mg by mouth at bedtime.      ferrous sulfate 325 (65 FE) MG tablet 1 tablet     folic acid (FOLVITE) 1 MG tablet Take 1 mg by mouth daily.     HYDROcodone-acetaminophen (NORCO) 10-325 MG tablet Take 1 tablet by mouth 3 (three) times daily as needed.     HYDROcodone-acetaminophen (NORCO/VICODIN) 5-325 MG tablet Take 1 tablet by mouth 4 (four) times daily as needed for  pain. (Patient not taking: Reported on 11/27/2021)     hydrocortisone ointment 0.5 % Apply 1 Application topically 2 (two) times daily. Apply to hips     insulin glargine (LANTUS) 100 unit/mL SOPN Inject 8 Units into the skin at bedtime.     ipratropium-albuterol (DUONEB) 0.5-2.5 (3) MG/3ML SOLN Take 3 mLs by nebulization every 6 (six) hours as needed (wheezing).     lidocaine (ASPERCREME LIDOCAINE) 4 % Place 1 patch onto the skin daily. To rt shoulder     loperamide (IMODIUM) 2 MG capsule Take by mouth as needed for diarrhea or loose stools.     metFORMIN (GLUCOPHAGE) 850 MG tablet Take 850 mg by mouth 2 (two) times daily.     metoprolol tartrate (LOPRESSOR) 25 MG tablet Take 25 mg by mouth daily.     mirabegron ER (MYRBETRIQ) 25 MG TB24 tablet Take 25 mg by mouth daily.     NOVOLIN 70/30 KWIKPEN (70-30) 100 UNIT/ML KwikPen Inject 10 Units into the skin 2 (two) times daily. (Patient not taking: Reported on 11/27/2021)     Olopatadine HCl (PATADAY) 0.7 % SOLN Place 1 drop into both eyes 2 (two) times daily.     ondansetron (ZOFRAN-ODT) 8 MG disintegrating tablet Take 8 mg by mouth every 8 (eight) hours as needed for nausea or vomiting  (dissolve orally).     pantoprazole (PROTONIX) 40 MG tablet Take one tablet once every morning (Patient taking differently: Take 40 mg by mouth daily before breakfast.) 30 tablet 5   Pollen Extracts (PROSTAT PO) Take 30 mLs by mouth.     predniSONE (DELTASONE) 20 MG tablet Take 2 tablets (40 mg total) by mouth daily. 10 tablet 0   predniSONE (DELTASONE) 5 MG tablet Take 5 mg by mouth at bedtime.     psyllium (METAMUCIL) 58.6 % packet Take 1 packet by mouth daily. 30 each 12   sodium chloride (OCEAN) 0.65 % SOLN nasal spray Place 1 spray into both nostrils as needed for congestion.     No current facility-administered medications for this visit.    Allergies  Allergen Reactions   Penicillins Hives    Has patient had a PCN reaction causing immediate rash, facial/tongue/throat swelling, SOB or lightheadedness with hypotension: Yes  Has patient had a PCN reaction causing severe rash involving mucus membranes or skin necrosis: No  Has patient had a PCN reaction that required hospitalization: No  Has patient had a PCN reaction occurring within the last 10 years: No  If all of the above answers are "NO", then may proceed with Cephalosporin use.  Patient has tolerated ceftriaxone in the recent past.  Other Reaction: Allergy    Has patient had a PCN reaction causing immediate rash, facial/tongue/throat swelling, SOB or lightheadedness with hypotension: Yes    Has patient had a PCN reaction causing severe rash involving mucus membranes or skin necrosis: No    Has patient had a PCN reaction that required hospitalization: No    Has patient had a PCN reaction occurring within the last 10 years: No    If all of the above answers are "NO", then may proceed with Cephalosporin use.    Patient has tolerated ceftriaxone in the recent past.   Sulfamethoxazole-Trimethoprim Nausea And Vomiting and Other (See Comments)    Stomach problems   Adalimumab Other (See Comments)    "Not effective"    Hydroxychloroquine Sulfate Other (See Comments)    "Plaquenil did not work after awhile"   Meperidine Other (See Comments)  Cramping   Meperidine Hcl Other (See Comments)    Cramping    Methotrexate Nausea And Vomiting and Other (See Comments)    "Projectile vomiting"   Penicillin G Sodium Hives   Phentermine-Topiramate Nausea Only and Other (See Comments)    "Nausea and dizziness & on re-trial, mental side effects so really cannot take it   Sulfa Antibiotics Nausea And Vomiting and Other (See Comments)    Stomach problems/GI upset, also   Sulfonamide Derivatives Nausea And Vomiting and Other (See Comments)    "Stomach problems"   Patient was seen in office for a face to face session Initial Session: Patient states she is living in Nursing Home [facility]) and referred herself for counseling. Says she has had RA for 40 years and Diabetes and Fibromyalgia. She says her dog died 2 days before Christmas and she was her best friend. Shreena has lived alone for "probably 40 years". As the medical conditions progressed, she got weak and could not take care of herself. Was in and out of hospital a lot. She fell at home and could not get up and that precipitated moving into nursing home 1 1/2 years ago. Has a roommate at the nursing home. She was in a home across the street from her son. Now her grandson and his family live in her home. Since being at nursing home, she has struggled with depression. She says she is very independent and needs things to stay active. She feels a need to so something worth while.  She is also distressed about her family. Her son lives close and has been a good care-giver to her. But, she feels he got burned out of taking care of her. He told her he did not want to spend his life taking care of her. Her daughter lives in [place] and is a Engineer, civil (consulting), but she is uninvolved. Her son does not call to check on her and offer any help. Her son was recently remarried and Bryce says she  understands that he wants to focus on his new life. She struggles that "he is all I got" and she told him how she feels. Part of her struggle is to determine if what she is feeling is "right". She says it is not hard for her to deterine what is truth and what is not.  She was married at age 65 and had her son at 26. Her parents were divorced and she lived with her dad from age [age]. As a child, she was so depressed from being separated from Dad, her mother sent her to her father. Her father used to make her admit to doing things she did not do. This left her questioning her own perceptions and she said this "scared her for life". She had quit HS because the principal told her "there is no place for people that are married". She went to work and got pregnant. She stayed married to her husband for 14 years. Husband was a long distance truck driver, so she was a single parent in many ways. She had a second child, took the GED, went to college and got an associates degree in accounting. After woring for a while, she went back to school and got a 4 year degree in psychology. 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 Had to retire due to the RA at around age 6. Went on disability. 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  She has tried numerous times to move back home, but her son will not allow it and tells her "if you move home you will be on your own". When she went to nursing home, her grandson and his wife moved in and cleaned her house out. Halah never got to go through her things to make choices. They had a yard sale and never paid her anything, claiming they gave away most of the things. She struggles with whether she should be upset with son about his behavior or his lack of fairness. Yet, she feels a very special bond with son, because he represents feeling love for the first time.  She talked about her very  strong faith, which she says is central to her life. 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 She does not, however, feel she has a healthy balance in her life and that her faith is dominant.  Says that she was on pain meds that created terrible hallucinations. They made med changes and it has taken care of the hallucinations.  Goals/Treatment Plan: Patient is seeking to reduce depressive symptoms of sadness, powerlessness and helplessness. She is also seeking guidance on most appropriate ways to respond to family about her distress and feelings of abandonment. Will utilize insight oriented psychotherapy to better understand her history and the impact on her current emotional state. Will also incorporate some behavioral strategies to help her better manage her feelings. Medication management/consultation.Goal date is 12-24.   Patient seen in office for face to face session.  Session Note: [place]on states she is depressed and ends up staying in bed all day. She feels neglected. She needed help with diaper change and called for help. It took them 3 hours to come to her. She has shared her concerns about her care with her children and asked them to contact the facility. Not sure if that has happened. She is now on a list for [facility] and hopes to be able to leave current facility soon.  We discussed her treatment plan. First, for her to get a med consult. We then will discuss behavioral strategies to manage her day to day depressive feelings. Next we will address her relationship with her son. We will have some sessions to prepare for a conjoint meeting and then set up a time to all meet together, with me facilitating the discussions.  She has another issue she says she wants to address. She says that she has "become closer to God" lately and she is driving herself crazy (her words) by constantly evaluating herself and questioning herself. We talked about her speaking with a minister/pastor. Also  talked about her fears and how it interplays with her rigid self reflection.     Diagnoses:  Major Depressive Disorder  Plan of Care: outpatient psychotherapy   Garrel Ridgel, PhD 11:45a-12:30p 45 minutes

## 2022-09-19 ENCOUNTER — Ambulatory Visit: Payer: Medicare Other | Admitting: Psychology

## 2022-09-19 DIAGNOSIS — F331 Major depressive disorder, recurrent, moderate: Secondary | ICD-10-CM

## 2022-09-19 NOTE — Progress Notes (Signed)
[facility] Counselor Initial Adult Exam  Name: [name] Date: 09/19/2022 MRN: [id number] DOB: Jul 30, 1940 PCP: Pcp, No    Guardian/Payee:  N/A    Paperwork requested: No   Reason for Visit /Presenting Problem: depression;adjustment to living circumstances  Mental Status Exam: Appearance:   Casual     Behavior:  Appropriate  Motor:  Normal  Speech/Language:   Clear and Coherent  Affect:  Appropriate  Mood:  depressed  Thought process:  normal  Thought content:    WNL  Sensory/Perceptual disturbances:    WNL  Orientation:  oriented to person, place, and situation  Attention:  Good  Concentration:  Good  Memory:  WNL  Fund of knowledge:   Good  Insight:    Good  Judgment:   Good  Impulse Control:  Good    Reported Symptoms:  sadness, agitation, confusion, helplessness  Risk Assessment: Danger to Self:  No Self-injurious Behavior: No Danger to Others: No Duty to Warn:no Physical Aggression / Violence:No  Access to Firearms a concern: No  Gang Involvement:No  Patient / guardian was educated about steps to take if suicide or homicide risk level increases between visits: n/a While future psychiatric events cannot be accurately predicted, the patient does not currently require acute inpatient psychiatric care and does not currently meet [facility] involuntary commitment criteria.  Substance Abuse History: Current substance abuse: No     Past Psychiatric History:   Previous psychological history is significant for depression, hallucinations Outpatient Providers:unknown History of Psych Hospitalization: No  Psychological Testing:  no    Abuse History:  Victim of: No.,  N/A    Report needed: No. Victim of Neglect:No. Perpetrator of  N/A   Witness / Exposure to Domestic Violence: No   Protective Services Involvement: No   Witness to [employer] Violence:  No   Family History:  Family History  Problem Relation Age of Onset   Hypertension Other    Diabetes Other    Stroke Other    Diabetes Mother    Diabetes Father    Diabetes Sister    Diabetes Maternal Aunt    Cancer Paternal Uncle    Cancer Paternal Grandmother    Breast cancer Paternal Grandmother     Living situation: the patient lives in an assisted living facility  Sexual Orientation: Straight  Relationship Status: divorced  Name of spouse / other:unknown If a parent, number of children / ages:adult son (local) and daughter ([place]).  Support Systems: lives alone  Financial Stress:  Yes   Income/Employment/Disability: Neurosurgeon: No   Educational History: Education: Risk manager: Protestant  Any cultural differences that may affect / interfere with treatment:  not applicable   Recreation/Hobbies: unknown  Stressors: Health problems   Marital or family conflict    Strengths: Church  Barriers:  lack of social supports   Legal History: Pending legal issue / charges: The patient has no significant history of legal issues. History of legal issue / charges:  N/A  Medical History/Surgical History: reviewed Past Medical History:  Diagnosis Date   Acute pulmonary embolism (HCC)  b/l; dx'ed May '22   Arthritis    Asthma    Complication of anesthesia    Anesthesia told her years ago she got too cold during surgery   Diabetes mellitus without complication (HCC)    Emphysema    Fibromyalgia    Osteoporosis    Rheumatoid arthritis(714.0)    Sleep apnea    no cpap    Past Surgical History:  Procedure Laterality Date   ABDOMINAL HYSTERECTOMY     ANKLE FRACTURE SURGERY  2007   BREAST SURGERY     mammoplasty and then removed   COLONOSCOPY WITH PROPOFOL N/A 08/22/2020   Procedure: COLONOSCOPY WITH PROPOFOL;  Surgeon: Kerin Salen, MD;  Location: [facility]  ENDOSCOPY;  Service: Gastroenterology;  Laterality: N/A;  If schedule availability permits, would like to add on upper endoscopy as well   ENTEROSCOPY N/A 11/11/2020   Procedure: ENTEROSCOPY;  Surgeon: Willis Modena, MD;  Location: [facility] ENDOSCOPY;  Service: Endoscopy;  Laterality: N/A;   ESOPHAGOGASTRODUODENOSCOPY (EGD) WITH PROPOFOL N/A 08/22/2020   Procedure: ESOPHAGOGASTRODUODENOSCOPY (EGD) WITH PROPOFOL;  Surgeon: Kerin Salen, MD;  Location: [facility] ENDOSCOPY;  Service: Gastroenterology;  Laterality: N/A;   FOOT SURGERY     numerous   GIVENS CAPSULE STUDY N/A 10/28/2020   Procedure: GIVENS CAPSULE STUDY;  Surgeon: Kathi Der, MD;  Location: [facility] ENDOSCOPY;  Service: Gastroenterology;  Laterality: N/A;   HOT HEMOSTASIS N/A 08/22/2020   Procedure: HOT HEMOSTASIS (ARGON PLASMA COAGULATION/BICAP);  Surgeon: Kerin Salen, MD;  Location: [facility] ENDOSCOPY;  Service: Gastroenterology;  Laterality: N/A;   I & D EXTREMITY Right 10/26/2014   Procedure: IRRIGATION AND DEBRIDEMENT RIGHT KNEE ;  Surgeon: Ollen Gross, MD;  Location: [facility] ORS;  Service: Orthopedics;  Laterality: Right;   IRRIGATION AND DEBRIDEMENT KNEE Right 03/09/2017   Procedure: IRRIGATION AND DEBRIDEMENT RIGHT KNEE PRE-PATELLAR BURSA;  Surgeon: Jene Every, MD;  Location: [facility] OR;  Service: Orthopedics;  Laterality: Right;   KNEE BURSECTOMY Right 09/16/2014   Procedure: EXCISON OF PRE PATELLA BURSA RIGHT KNEE ;  Surgeon: Ollen Gross, MD;  Location: [facility] ORS;  Service: Orthopedics;  Laterality: Right;   PARTIAL HYSTERECTOMY  1970   POLYPECTOMY  08/22/2020   Procedure: POLYPECTOMY;  Surgeon: Kerin Salen, MD;  Location: [facility] ENDOSCOPY;  Service: Gastroenterology;;   TOTAL KNEE ARTHROPLASTY  2005 / 2006   x2    Medications: Current Outpatient Medications  Medication Sig Dispense Refill   acetaminophen (TYLENOL) 500 MG tablet Take 1,000 mg by mouth every 6 (six) hours as needed for mild pain (or headaches).     ascorbic acid (VITAMIN C) 500 MG  tablet Take 500 mg by mouth 2 (two) times daily.     atorvastatin (LIPITOR) 20 MG tablet Take 20 mg by mouth daily.     azelastine (OPTIVAR) 0.05 % ophthalmic solution Place 1 drop into both eyes daily.     budesonide (PULMICORT) 0.25 MG/2ML nebulizer solution Take 2 mLs (0.25 mg total) by nebulization 2 (two) times daily. 120 mL 0   DULoxetine (CYMBALTA) 60 MG capsule Take 60 mg by mouth at bedtime.      ezetimibe (ZETIA) 10 MG tablet Take 10 mg by mouth at bedtime.      ferrous sulfate 325 (65 FE) MG tablet 1 tablet     folic acid (FOLVITE) 1 MG tablet Take 1 mg by mouth daily.     HYDROcodone-acetaminophen (NORCO) 10-325 MG tablet Take 1 tablet by mouth 3 (three) times daily as needed.     HYDROcodone-acetaminophen (NORCO/VICODIN) 5-325  MG tablet Take 1 tablet by mouth 4 (four) times daily as needed for pain. (Patient not taking: Reported on 11/27/2021)     hydrocortisone ointment 0.5 % Apply 1 Application topically 2 (two) times daily. Apply to hips     insulin glargine (LANTUS) 100 unit/mL SOPN Inject 8 Units into the skin at bedtime.     ipratropium-albuterol (DUONEB) 0.5-2.5 (3) MG/3ML SOLN Take 3 mLs by nebulization every 6 (six) hours as needed (wheezing).     lidocaine (ASPERCREME LIDOCAINE) 4 % Place 1 patch onto the skin daily. To rt shoulder     loperamide (IMODIUM) 2 MG capsule Take by mouth as needed for diarrhea or loose stools.     metFORMIN (GLUCOPHAGE) 850 MG tablet Take 850 mg by mouth 2 (two) times daily.     metoprolol tartrate (LOPRESSOR) 25 MG tablet Take 25 mg by mouth daily.     mirabegron ER (MYRBETRIQ) 25 MG TB24 tablet Take 25 mg by mouth daily.     NOVOLIN 70/30 KWIKPEN (70-30) 100 UNIT/ML KwikPen Inject 10 Units into the skin 2 (two) times daily. (Patient not taking: Reported on 11/27/2021)     Olopatadine HCl (PATADAY) 0.7 % SOLN Place 1 drop into both eyes 2 (two) times daily.     ondansetron (ZOFRAN-ODT) 8 MG disintegrating tablet Take 8 mg by mouth every 8 (eight)  hours as needed for nausea or vomiting (dissolve orally).     pantoprazole (PROTONIX) 40 MG tablet Take one tablet once every morning (Patient taking differently: Take 40 mg by mouth daily before breakfast.) 30 tablet 5   Pollen Extracts (PROSTAT PO) Take 30 mLs by mouth.     predniSONE (DELTASONE) 20 MG tablet Take 2 tablets (40 mg total) by mouth daily. 10 tablet 0   predniSONE (DELTASONE) 5 MG tablet Take 5 mg by mouth at bedtime.     psyllium (METAMUCIL) 58.6 % packet Take 1 packet by mouth daily. 30 each 12   sodium chloride (OCEAN) 0.65 % SOLN nasal spray Place 1 spray into both nostrils as needed for congestion.     No current facility-administered medications for this visit.    Allergies  Allergen Reactions   Penicillins Hives    Has patient had a PCN reaction causing immediate rash, facial/tongue/throat swelling, SOB or lightheadedness with hypotension: Yes  Has patient had a PCN reaction causing severe rash involving mucus membranes or skin necrosis: No  Has patient had a PCN reaction that required hospitalization: No  Has patient had a PCN reaction occurring within the last 10 years: No  If all of the above answers are "NO", then may proceed with Cephalosporin use.  Patient has tolerated ceftriaxone in the recent past.  Other Reaction: Allergy    Has patient had a PCN reaction causing immediate rash, facial/tongue/throat swelling, SOB or lightheadedness with hypotension: Yes    Has patient had a PCN reaction causing severe rash involving mucus membranes or skin necrosis: No    Has patient had a PCN reaction that required hospitalization: No    Has patient had a PCN reaction occurring within the last 10 years: No    If all of the above answers are "NO", then may proceed with Cephalosporin use.    Patient has tolerated ceftriaxone in the recent past.   Sulfamethoxazole-Trimethoprim Nausea And Vomiting and Other (See Comments)    Stomach problems   Adalimumab Other  (See Comments)    "Not effective"   Hydroxychloroquine Sulfate Other (See Comments)    "  Plaquenil did not work after [employer]"   Meperidine Other (See Comments)    Cramping   Meperidine Hcl Other (See Comments)    Cramping    Methotrexate Nausea And Vomiting and Other (See Comments)    "Projectile vomiting"   Penicillin G Sodium Hives   Phentermine-Topiramate Nausea Only and Other (See Comments)    "Nausea and dizziness & on re-trial, mental side effects so really cannot take it   Sulfa Antibiotics Nausea And Vomiting and Other (See Comments)    Stomach problems/GI upset, also   Sulfonamide Derivatives Nausea And Vomiting and Other (See Comments)    "Stomach problems"   Patient was seen in office for a face to face session Initial Session: Patient states she is living in Nursing Home [facility]) and referred herself for counseling. Says she has had RA for 40 years and Diabetes and Fibromyalgia. She says her dog died 2 days before Christmas and she was her best friend. Ivory has lived alone for "probably 40 years". As the medical conditions progressed, she got weak and could not take care of herself. Was in and out of hospital a lot. She fell at home and could not get up and that precipitated moving into nursing home 1 1/2 years ago. Has a roommate at the nursing home. She was in a home across the street from her son. Now her grandson and his family live in her home. Since being at nursing home, she has struggled with depression. She says she is very independent and needs things to stay active. She feels a need to so something worth while.  She is also distressed about her family. Her son lives close and has been a good care-giver to her. But, she feels he got burned out of taking care of her. He told her he did not want to spend his life taking care of her. Her daughter lives in [place] and is a Engineer, civil (consulting), but she is uninvolved. Her son does not call to check on her and offer any help. Her son was  recently remarried and Kiernan says she understands that he wants to focus on his new life. She struggles that "he is all I got" and she told him how she feels. Part of her struggle is to determine if what she is feeling is "right". She says it is not hard for her to deterine what is truth and what is not.  She was married at age 81 and had her son at 3. Her parents were divorced and she lived with her dad from age [age]. As a child, she was so depressed from being separated from Dad, her mother sent her to her father. Her father used to make her admit to doing things she did not do. This left her questioning her own perceptions and she said this "scared her for life". She had quit HS because the principal told her "there is no place for people that are married". She went to work and got pregnant. She stayed married to her husband for 14 years. Husband was a long distance truck driver, so she was a single parent in many ways. She had a second child, took the GED, went to college and got an associates degree in accounting. After woring for a while, she went back to school and got a 4 year degree in psychology. 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 Had to retire due to the RA at around age 83. Went on disability.  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  She has tried numerous times to move back home, but her son will not allow it and tells her "if you move home you will be on your own". When she went to nursing home, her grandson and his wife moved in and cleaned her house out. Trany never got to go through her things to make choices. They had a yard sale and never paid her anything, claiming they gave away most of the things. She struggles with whether she should be upset with son about his behavior or his lack of fairness. Yet, she feels a very special bond with son, because he represents feeling love for the first  time.  She talked about her very strong faith, which she says is central to her life. 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 She does not, however, feel she has a healthy balance in her life and that her faith is dominant.  Says that she was on pain meds that created terrible hallucinations. They made med changes and it has taken care of the hallucinations.  Goals/Treatment Plan: Patient is seeking to reduce depressive symptoms of sadness, powerlessness and helplessness. She is also seeking guidance on most appropriate ways to respond to family about her distress and feelings of abandonment. Will utilize insight oriented psychotherapy to better understand her history and the impact on her current emotional state. Will also incorporate some behavioral strategies to help her better manage her feelings. Medication management/consultation.Goal date is 12-24.   Patient seen in office for face to face session.   Session Note: Jaidyn states she continues to be disappointed with her care facility.  She says she set up appointment with Zenaida Niece to [place] [address]. This was a highlight for her and made her feel much better. This was a positive move for her to take control of her situation and improve quality of life. She asked person at her facility to call the psychiatrist I referred her to, but that person told her their doctor could handle the situation. They told Tamari that they will send in their person and if they agreed, then they will allow her to schedule with outside psychiatrist. Nobody has seen her to this point. I suggested that she follow-up with her facility to make sure she is seen. She has little confidence in the facility providers. I told her I want her to be evaluated by a psychiatrist (or psychiatric NP). I will send her back with a note explaining this request. Janyiah had written a letter to her son (last Spring) expressing her feelings that she is distressed that he is not  doing more for her, given the strong love she feels for him. Charne told him if feels to her that he is not interested in having a relationship. She feels that he makes no effort to visit, bring her things and check in on her. He wrote back that he is tired of her trying to make him feel guilty. She stopped communicating with him after that exchange, but they reconnected over time. She states that every 1 1/2-2 months, her son picks her up and takes her to his home. She has trouble "standing up" to her son. She is trying to adjust her expectations of her son and "love him unconditionally". She thinks that her son is relieved she is in a care facility, so the pressure is off  of him. Serenitee feels her daughter is mostly "useless"in that she feel no responsibility to care for her. Discussed how to continue dialogue with son.        Diagnoses:  Major Depressive Disorder  Plan of Care: outpatient psychotherapy   Garrel Ridgel, PhD 11:40a-12:30p 50 minutes

## 2022-10-03 ENCOUNTER — Ambulatory Visit: Payer: Medicare Other | Admitting: Psychology

## 2022-11-28 ENCOUNTER — Ambulatory Visit: Payer: Medicare Other | Admitting: Psychology

## 2022-12-20 ENCOUNTER — Telehealth: Payer: Self-pay | Admitting: Hematology and Oncology

## 2022-12-20 NOTE — Telephone Encounter (Signed)
[facility] had called on her behalf need to reschedule appointment on 10/17 for a later date, they are aware of her rescheduled appointment times/dates

## 2022-12-26 ENCOUNTER — Inpatient Hospital Stay: Payer: Medicare Other | Admitting: Hematology and Oncology

## 2023-01-08 ENCOUNTER — Inpatient Hospital Stay: Payer: Medicare Other

## 2023-01-08 ENCOUNTER — Encounter: Payer: Self-pay | Admitting: Hematology and Oncology

## 2023-01-08 ENCOUNTER — Inpatient Hospital Stay: Payer: Medicare Other | Attending: Hematology and Oncology | Admitting: Hematology and Oncology

## 2023-01-08 VITALS — BP 125/44 | HR 74 | Temp 97.5°F | Resp 18

## 2023-01-08 DIAGNOSIS — Z87891 Personal history of nicotine dependence: Secondary | ICD-10-CM | POA: Diagnosis not present

## 2023-01-08 DIAGNOSIS — K5900 Constipation, unspecified: Secondary | ICD-10-CM | POA: Insufficient documentation

## 2023-01-08 DIAGNOSIS — Z803 Family history of malignant neoplasm of breast: Secondary | ICD-10-CM | POA: Diagnosis not present

## 2023-01-08 DIAGNOSIS — D72829 Elevated white blood cell count, unspecified: Secondary | ICD-10-CM | POA: Diagnosis not present

## 2023-01-08 DIAGNOSIS — M069 Rheumatoid arthritis, unspecified: Secondary | ICD-10-CM | POA: Insufficient documentation

## 2023-01-08 DIAGNOSIS — D509 Iron deficiency anemia, unspecified: Secondary | ICD-10-CM | POA: Diagnosis present

## 2023-01-08 DIAGNOSIS — D500 Iron deficiency anemia secondary to blood loss (chronic): Secondary | ICD-10-CM | POA: Diagnosis not present

## 2023-01-08 LAB — CBC WITH DIFFERENTIAL/PLATELET
Abs Immature Granulocytes: 0.09 10*3/uL — ABNORMAL HIGH (ref 0.00–0.07)
Basophils Absolute: 0.1 10*3/uL (ref 0.0–0.1)
Basophils Relative: 1 %
Eosinophils Absolute: 0.3 10*3/uL (ref 0.0–0.5)
Eosinophils Relative: 3 %
HCT: 36.3 % (ref 36.0–46.0)
Hemoglobin: 11.1 g/dL — ABNORMAL LOW (ref 12.0–15.0)
Immature Granulocytes: 1 %
Lymphocytes Relative: 26 %
Lymphs Abs: 2.8 10*3/uL (ref 0.7–4.0)
MCH: 27.4 pg (ref 26.0–34.0)
MCHC: 30.6 g/dL (ref 30.0–36.0)
MCV: 89.6 fL (ref 80.0–100.0)
Monocytes Absolute: 0.8 10*3/uL (ref 0.1–1.0)
Monocytes Relative: 7 %
Neutro Abs: 6.8 10*3/uL (ref 1.7–7.7)
Neutrophils Relative %: 62 %
Platelets: 347 10*3/uL (ref 150–400)
RBC: 4.05 MIL/uL (ref 3.87–5.11)
RDW: 14.6 % (ref 11.5–15.5)
WBC: 10.7 10*3/uL — ABNORMAL HIGH (ref 4.0–10.5)
nRBC: 0 % (ref 0.0–0.2)

## 2023-01-08 NOTE — Progress Notes (Unsigned)
[facility] [phone]   [phone]  PROGRESS NOTE  Patient Care Team: Pcp, No as PCP - General Hallows, Rhett K, MD as Consulting Physician (Orthopedic Surgery) Rachel Moulds, MD as Consulting Physician (Hematology and Oncology)   CHIEF COMPLAINTS/PURPOSE OF CONSULTATION:  Microcytic anemia  HISTORY OF PRESENTING ILLNESS:  [name] 82 y.o. female returns for a follow up for iron deficiency anemia.   [name] is here for a follow-up by herself.  She came in a wheelchair.    Discussed the use of AI scribe software for clinical note transcription with the patient, who gave verbal consent to proceed.  History of Present Illness   The patient, with a history of rheumatoid arthritis, presents for follow up of anemia. Back in May, she was in the ED for some RA related pain.The pain was attributed to an exacerbation of her rheumatoid arthritis. She also reports tenderness in the abdomen. The patient resides in a nursing home and has been experiencing issues with the service there, including long response times to calls for assistance and inadequate care. She also reports constipation and has been seeing a gastroenterologist in [place] for this issue. The patient has been tested for inflammation in the colon, but the results are not yet complete. She also reports a high level of pain in her shoulders, which she describes as "bone on bone."  She however denies any black loose stool, she notices black stool from iron. No hematochezia.     Rest of the pertinent 10 point ROS reviewed and negative  MEDICAL HISTORY:  Past Medical History:  Diagnosis Date   Acute pulmonary embolism (HCC)    b/l; dx'ed May '22   Arthritis    Asthma    Complication of anesthesia    Anesthesia told her years ago she got too cold during surgery   Diabetes mellitus without complication (HCC)    Emphysema    Fibromyalgia    Osteoporosis    Rheumatoid arthritis(714.0)     Sleep apnea    no cpap    SURGICAL HISTORY: Past Surgical History:  Procedure Laterality Date   ABDOMINAL HYSTERECTOMY     ANKLE FRACTURE SURGERY  2007   BREAST SURGERY     mammoplasty and then removed   COLONOSCOPY WITH PROPOFOL N/A 08/22/2020   Procedure: COLONOSCOPY WITH PROPOFOL;  Surgeon: Kerin Salen, MD;  Location: [facility];  Service: Gastroenterology;  Laterality: N/A;  If schedule availability permits, would like to add on upper endoscopy as well   ENTEROSCOPY N/A 11/11/2020   Procedure: ENTEROSCOPY;  Surgeon: Willis Modena, MD;  Location: [facility];  Service: Endoscopy;  Laterality: N/A;   ESOPHAGOGASTRODUODENOSCOPY (EGD) WITH PROPOFOL N/A 08/22/2020   Procedure: ESOPHAGOGASTRODUODENOSCOPY (EGD) WITH PROPOFOL;  Surgeon: Kerin Salen, MD;  Location: [facility];  Service: Gastroenterology;  Laterality: N/A;   FOOT SURGERY     numerous   GIVENS CAPSULE STUDY N/A 10/28/2020   Procedure: GIVENS CAPSULE STUDY;  Surgeon: Kathi Der, MD;  Location: [facility];  Service: Gastroenterology;  Laterality: N/A;   HOT HEMOSTASIS N/A 08/22/2020   Procedure: HOT HEMOSTASIS (ARGON PLASMA COAGULATION/BICAP);  Surgeon: Kerin Salen, MD;  Location: [facility];  Service: Gastroenterology;  Laterality: N/A;   I & D EXTREMITY Right 10/26/2014   Procedure: IRRIGATION AND DEBRIDEMENT RIGHT KNEE ;  Surgeon: Ollen Gross, MD;  Location: [facility];  Service: Orthopedics;  Laterality: Right;   IRRIGATION AND DEBRIDEMENT KNEE Right 03/09/2017   Procedure: IRRIGATION AND DEBRIDEMENT RIGHT  KNEE PRE-PATELLAR BURSA;  Surgeon: Jene Every, MD;  Location: [facility];  Service: Orthopedics;  Laterality: Right;   KNEE BURSECTOMY Right 09/16/2014   Procedure: EXCISON OF PRE PATELLA BURSA RIGHT KNEE ;  Surgeon: Ollen Gross, MD;  Location: [facility];  Service: Orthopedics;  Laterality: Right;   PARTIAL HYSTERECTOMY  1970   POLYPECTOMY  08/22/2020   Procedure: POLYPECTOMY;  Surgeon: Kerin Salen, MD;  Location: [facility];  Service: Gastroenterology;;   TOTAL KNEE ARTHROPLASTY  2005 / 2006   x2    SOCIAL HISTORY: Social History   Socioeconomic History   Marital status: Divorced    Spouse name: Not on file   Number of children: Not on file   Years of education: Not on file   Highest education level: Not on file  Occupational History   Not on file  Tobacco Use   Smoking status: Former    Current packs/day: 0.00    Types: Cigarettes    Quit date: 03/11/1994    Years since quitting: 28.8   Smokeless tobacco: Never  Vaping Use   Vaping status: Never Used  Substance and Sexual Activity   Alcohol use: No   Drug use: No   Sexual activity: Not on file  Other Topics Concern   Not on file  Social History Narrative   Not on file   Social Determinants of Health   Financial Resource Strain: Low Risk  (01/08/2019)   Overall Financial Resource Strain (CARDIA)    Difficulty of Paying Living Expenses: Not hard at all  Food Insecurity: No Food Insecurity (09/12/2020)   Hunger Vital Sign    Worried About Running Out of Food in the Last Year: Never true    Ran Out of Food in the Last Year: Never true  Transportation Needs: Unmet Transportation Needs (09/12/2020)   PRAPARE - Transportation    Lack of Transportation (Medical): Yes    Lack of Transportation (Non-Medical): Yes  Physical Activity: Inactive (01/08/2019)   Exercise Vital Sign    Days of Exercise per Week: 0 days    Minutes of Exercise per Session: 0 min  Stress: Stress Concern Present (01/08/2019)   [employer] of Occupational Health - Occupational Stress Questionnaire    Feeling of Stress : To some extent  Social Connections: Unknown (07/20/2021)   [facility]   Social Network    Social Network: Not on file  Intimate Partner Violence: Unknown (06/12/2021)   Received from [facility]   HITS    Physically Hurt: Not on file    Insult or Talk Down To: Not on file    Threaten  Physical Harm: Not on file    Scream or Curse: Not on file    FAMILY HISTORY: Family History  Problem Relation Age of Onset   Hypertension Other    Diabetes Other    Stroke Other    Diabetes Mother    Diabetes Father    Diabetes Sister    Diabetes Maternal Aunt    Cancer Paternal Uncle    Cancer Paternal Grandmother    Breast cancer Paternal Grandmother     ALLERGIES:  is allergic to penicillins, sulfamethoxazole-trimethoprim, adalimumab, hydroxychloroquine sulfate, meperidine, meperidine hcl, methotrexate, penicillin g sodium, phentermine-topiramate, sulfa antibiotics, and sulfonamide derivatives.  MEDICATIONS:  Current Outpatient Medications  Medication Sig Dispense Refill   acetaminophen (TYLENOL) 500 MG tablet Take 1,000 mg by mouth every 6 (six) hours as needed for mild pain (or headaches).  ascorbic acid (VITAMIN C) 500 MG tablet Take 500 mg by mouth 2 (two) times daily.     atorvastatin (LIPITOR) 20 MG tablet Take 20 mg by mouth daily.     azelastine (OPTIVAR) 0.05 % ophthalmic solution Place 1 drop into both eyes daily.     budesonide (PULMICORT) 0.25 MG/2ML nebulizer solution Take 2 mLs (0.25 mg total) by nebulization 2 (two) times daily. 120 mL 0   DULoxetine (CYMBALTA) 60 MG capsule Take 60 mg by mouth at bedtime.      ezetimibe (ZETIA) 10 MG tablet Take 10 mg by mouth at bedtime.      ferrous sulfate 325 (65 FE) MG tablet 1 tablet     folic acid (FOLVITE) 1 MG tablet Take 1 mg by mouth daily.     HYDROcodone-acetaminophen (NORCO) 10-325 MG tablet Take 1 tablet by mouth 3 (three) times daily as needed.     HYDROcodone-acetaminophen (NORCO/VICODIN) 5-325 MG tablet Take 1 tablet by mouth 4 (four) times daily as needed for pain. (Patient not taking: Reported on 11/27/2021)     hydrocortisone ointment 0.5 % Apply 1 Application topically 2 (two) times daily. Apply to hips     insulin glargine (LANTUS) 100 unit/mL SOPN Inject 8 Units into the skin at bedtime.      ipratropium-albuterol (DUONEB) 0.5-2.5 (3) MG/3ML SOLN Take 3 mLs by nebulization every 6 (six) hours as needed (wheezing).     lidocaine (ASPERCREME LIDOCAINE) 4 % Place 1 patch onto the skin daily. To rt shoulder     loperamide (IMODIUM) 2 MG capsule Take by mouth as needed for diarrhea or loose stools.     metFORMIN (GLUCOPHAGE) 850 MG tablet Take 850 mg by mouth 2 (two) times daily.     metoprolol tartrate (LOPRESSOR) 25 MG tablet Take 25 mg by mouth daily.     mirabegron ER (MYRBETRIQ) 25 MG TB24 tablet Take 25 mg by mouth daily.     NOVOLIN 70/30 KWIKPEN (70-30) 100 UNIT/ML KwikPen Inject 10 Units into the skin 2 (two) times daily. (Patient not taking: Reported on 11/27/2021)     Olopatadine HCl (PATADAY) 0.7 % SOLN Place 1 drop into both eyes 2 (two) times daily.     ondansetron (ZOFRAN-ODT) 8 MG disintegrating tablet Take 8 mg by mouth every 8 (eight) hours as needed for nausea or vomiting (dissolve orally).     pantoprazole (PROTONIX) 40 MG tablet Take one tablet once every morning (Patient taking differently: Take 40 mg by mouth daily before breakfast.) 30 tablet 5   Pollen Extracts (PROSTAT PO) Take 30 mLs by mouth.     predniSONE (DELTASONE) 20 MG tablet Take 2 tablets (40 mg total) by mouth daily. 10 tablet 0   predniSONE (DELTASONE) 5 MG tablet Take 5 mg by mouth at bedtime.     psyllium (METAMUCIL) 58.6 % packet Take 1 packet by mouth daily. 30 each 12   sodium chloride (OCEAN) 0.65 % SOLN nasal spray Place 1 spray into both nostrils as needed for congestion.     No current facility-administered medications for this visit.    PHYSICAL EXAMINATION: ECOG PERFORMANCE STATUS: 2 - Symptomatic, <50% confined to bed  Vitals:   01/08/23 1529  BP: (!) 125/44  Pulse: 74  Resp: 18  Temp: (!) 97.5 F (36.4 C)  SpO2: 99%    Filed Weights     GENERAL: female in NAD, elderly came in a wheelchair. Pulmonary/Chest: Effort normal and breath sounds normal. No respiratory distress. No  wheezes. No rales.  Musculoskeletal: Severe arthritis Skin: Skin is warm and dry. No rash noted. Not diaphoretic. No erythema. No pallor.  Psychiatric: Mood, memory and judgment normal.    LABORATORY DATA:  I have reviewed the data as listed    Latest Ref Rng & Units 01/08/2023    3:51 PM 08/04/2022    2:58 PM 06/25/2022    2:58 PM  CBC  WBC 4.0 - 10.5 K/uL 10.7  11.5  11.3   Hemoglobin 12.0 - 15.0 g/dL 28.4  13.2  44.0   Hematocrit 36.0 - 46.0 % 36.3  37.4  38.1   Platelets 150 - 400 K/uL 347  349  355        Latest Ref Rng & Units 08/04/2022    2:58 PM 06/25/2022    2:58 PM 12/31/2021    9:52 AM  CMP  Glucose 70 - 99 mg/dL 102  94  725   BUN 8 - 23 mg/dL 25  27  19    Creatinine 0.44 - 1.00 mg/dL 3.66  4.40  3.47   Sodium 135 - 145 mmol/L 141  141  141   Potassium 3.5 - 5.1 mmol/L 4.4  4.5  5.2   Chloride 98 - 111 mmol/L 110  108  109   CO2 22 - 32 mmol/L 23  25  24    Calcium 8.9 - 10.3 mg/dL 8.9  9.5  8.8   Total Protein 6.5 - 8.1 g/dL  6.4  6.4   Total Bilirubin 0.3 - 1.2 mg/dL  0.4  0.5   Alkaline Phos 38 - 126 U/L  65  67   AST 15 - 41 U/L  12  12   ALT 0 - 44 U/L  11  11    ASSESSMENT & PLAN [name] is a 82 y.o. female who presents for a follow up for iron deficiency anemia.   Rheumatoid Arthritis Severe pain exacerbation leading to ER visit in May. -Continue current management plan.  Anemia Labs from visit today showed a hemoglobin of 11.1 g/dL, overall very stable, no evidence of microcytosis, normal platelet count, mildly elevated white blood cell count.  She can continue oral iron supplementation.  No indication for blood transfusion  Constipation Patient reports difficulty with bowel movements. -Continue current management plan under GI specialist in [place].  Nursing Home Care Patient reports dissatisfaction with care, including long response times to call buzzer and inadequate hygiene care. -Encourage patient to report these issues to the nursing  home management.  Follow-up Pending results of today's blood work, plan for follow-up in six months. If patient is hospitalized or has any concerns, she should contact the office for an earlier appointment.   Orders Placed This Encounter  Procedures   CBC with Differential/Platelet    Standing Status:   Standing    Number of Occurrences:   22    Standing Expiration Date:   01/08/2024   Iron and Iron Binding Capacity ([facility] only)    Standing Status:   Future    Number of Occurrences:   1    Standing Expiration Date:   01/08/2024   Ferritin    Standing Status:   Future    Number of Occurrences:   1    Standing Expiration Date:   01/08/2024    All questions were answered. The patient knows to call the clinic with any problems, questions or concerns.  I have spent a total of 30 minutes minutes of face-to-face and  non-face-to-face time, preparing to see the patient,  performing a medically appropriate examination, counseling and educating the patient, ordering medications/tests, communicating with other health care professionals, documenting clinical information in the electronic health record, and care coordination.

## 2023-01-09 LAB — IRON AND IRON BINDING CAPACITY (CC-WL,HP ONLY)
Iron: 54 ug/dL (ref 28–170)
Saturation Ratios: 19 % (ref 10.4–31.8)
TIBC: 290 ug/dL (ref 250–450)
UIBC: 236 ug/dL (ref 148–442)

## 2023-01-09 LAB — FERRITIN: Ferritin: 47 ng/mL (ref 11–307)

## 2023-05-28 IMAGING — CT CT ABD-PELV W/ CM
2 of 5 series · 16 of 46 positions shown, 18 images · IV contrast (omnipaque)
Comparison: Abdominal CTA 08/17/2020

CLINICAL DATA: Abdominal pain, acute, nonlocalized

EXAM:
CT ABDOMEN AND PELVIS WITH CONTRAST
TECHNIQUE: Multidetector CT imaging of the abdomen and pelvis was performed
using the standard protocol following bolus administration of
intravenous contrast.
CONTRAST:  80mL OMNIPAQUE IOHEXOL 350 MG/ML SOLN

[Series 6: a/p w/ 5mm · axial · 0.98mm/px · z∈[+820,+1255]mm · 13 of 97 slices shown, 15 images]
[im 5/97  soft-tissue]
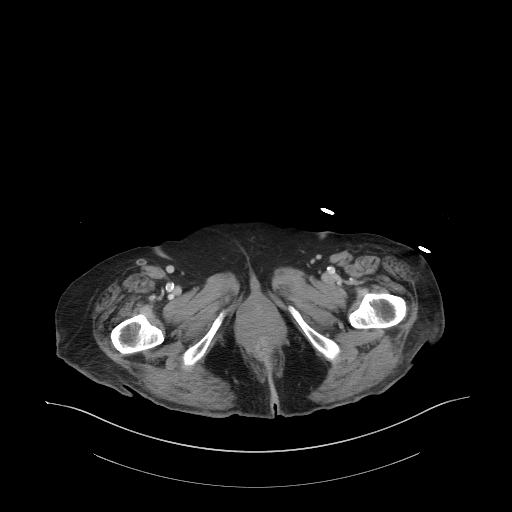
[im 5/97  bone]
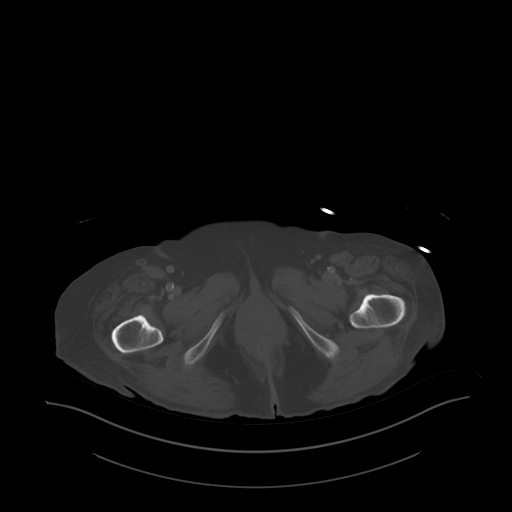
[im 15/97  soft-tissue]
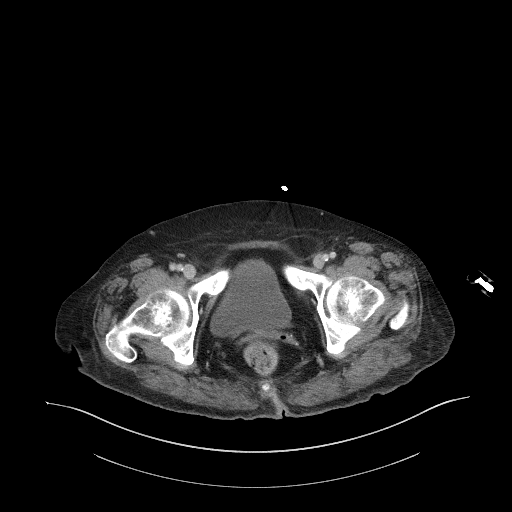
[im 20/97  soft-tissue]
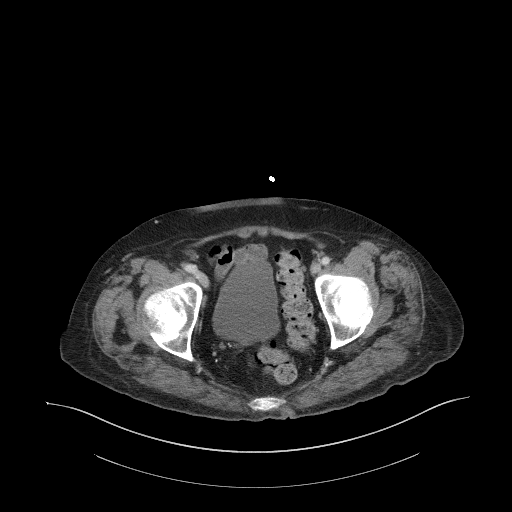
[im 29/97  soft-tissue]
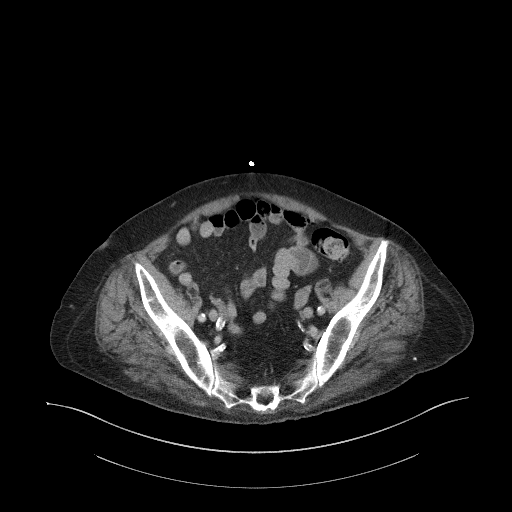
[im 34/97  soft-tissue]
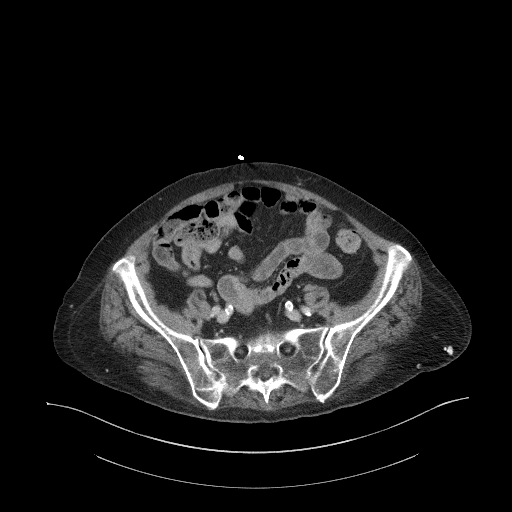
[im 44/97  soft-tissue]
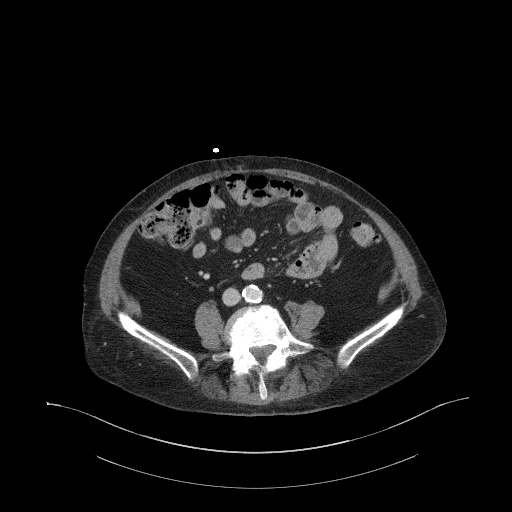
[im 49/97  soft-tissue]
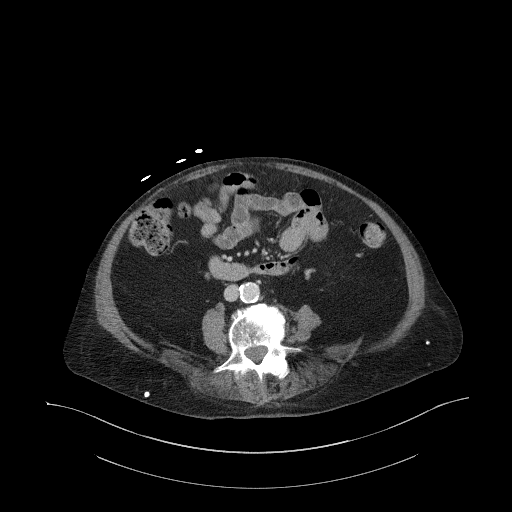
[im 53/97  soft-tissue]
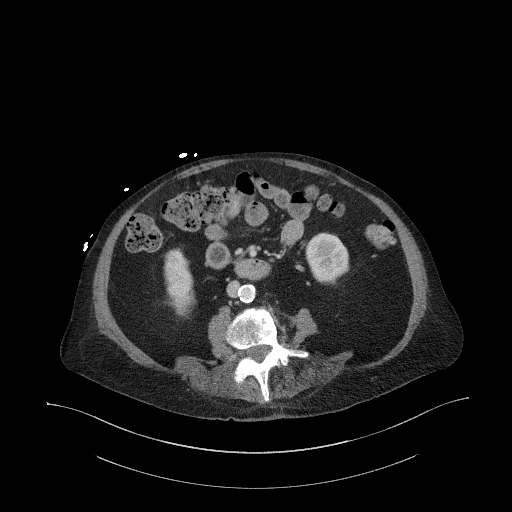
[im 63/97  soft-tissue]
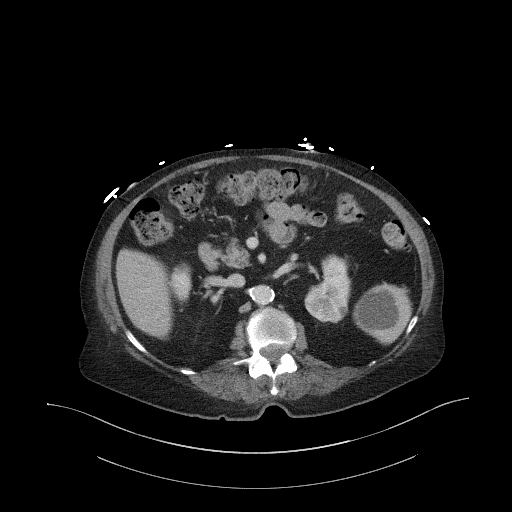
[im 63/97  bone]
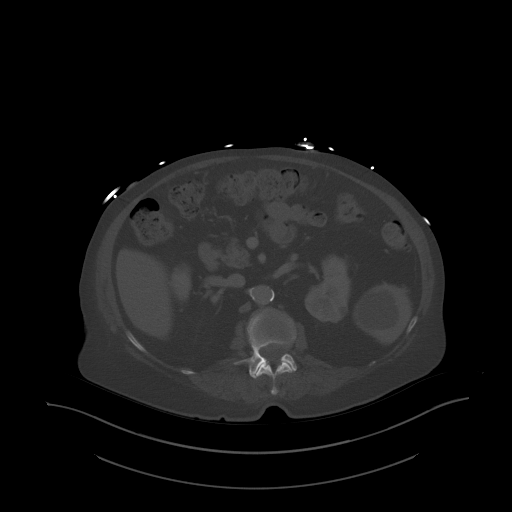
[im 68/97  soft-tissue]
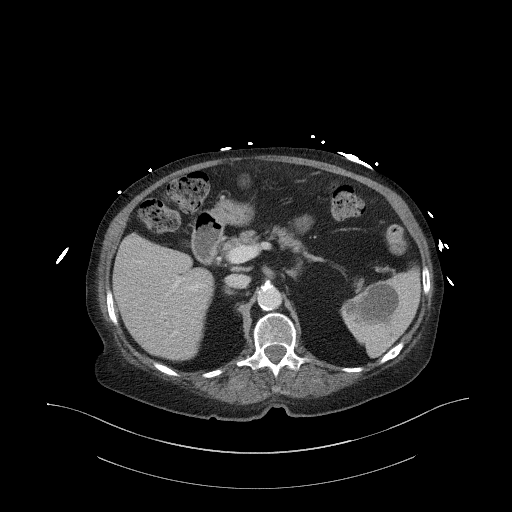
[im 77/97  soft-tissue]
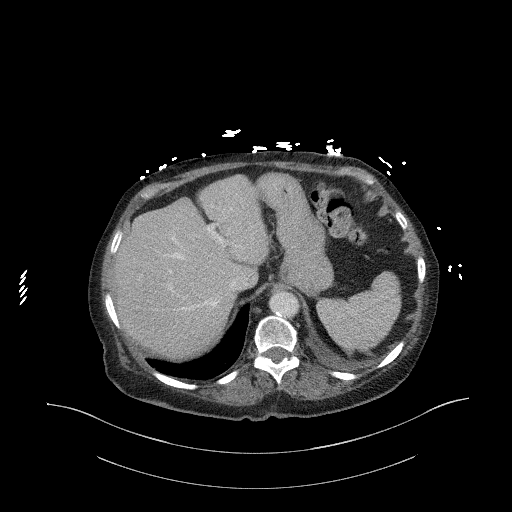
[im 82/97  soft-tissue]
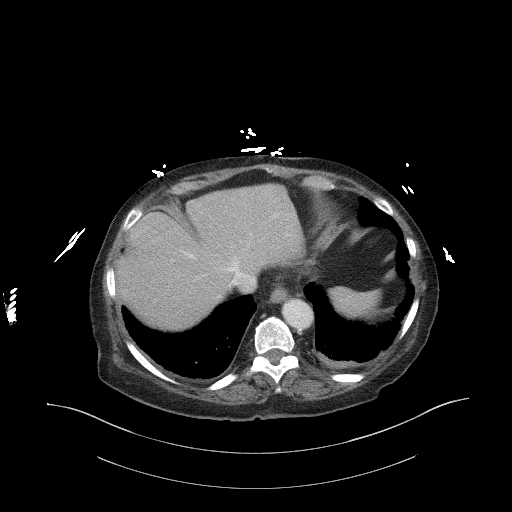
[im 92/97  soft-tissue]
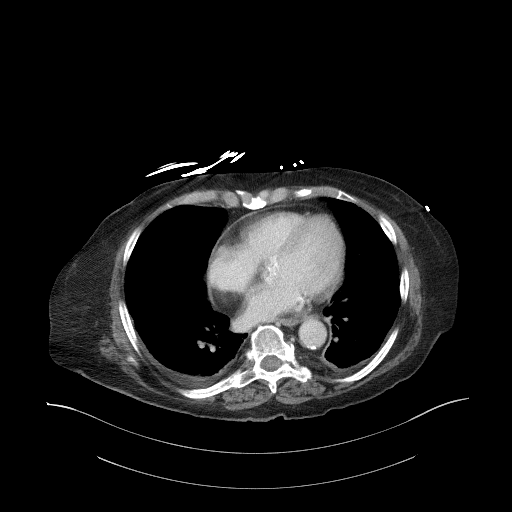

[Series 9: a/p w/ cor · coronal · 0.85mm/px · 3 of 145 slices shown]
[im 49/145  soft-tissue]
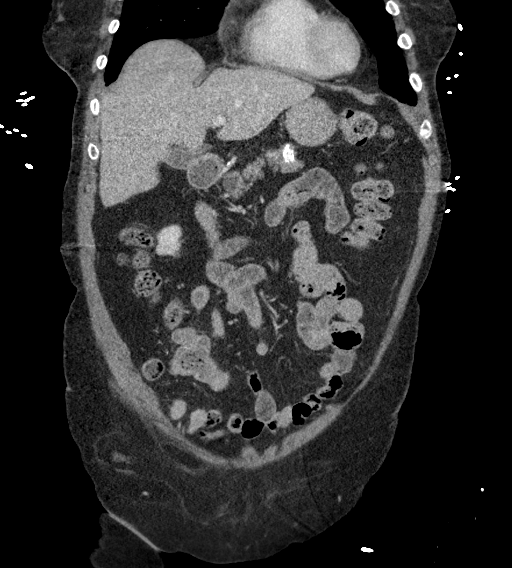
[im 65/145  soft-tissue]
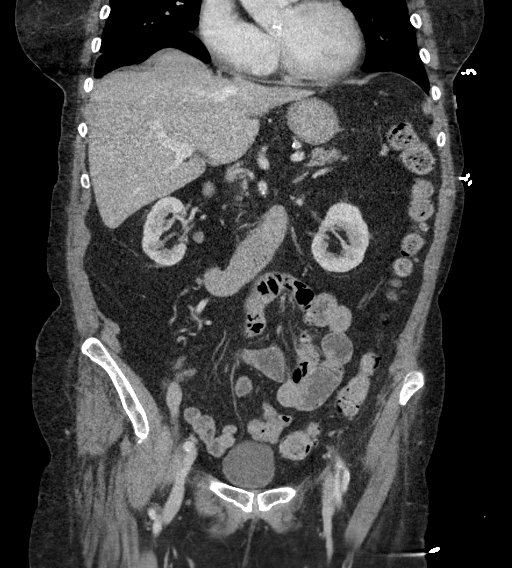
[im 81/145  soft-tissue]
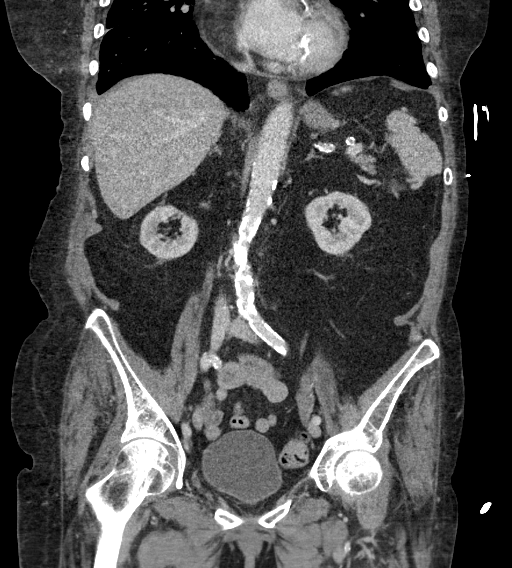

[16 of 46 positions shown; findings below may reference images not displayed]

FINDINGS: Lower chest: Assessed on concurrent chest CT, reported separately.

Hepatobiliary: No focal liver abnormality is seen. No gallstones,
gallbladder wall thickening, or biliary dilatation.

Pancreas: 12 mm cyst in the pancreatic head, series 6, image 32.
This is unchanged from prior exam. No ductal dilatation or
inflammation.

Spleen: Stable cyst in the central and lower spleen. Normal in size.
No acute findings.

Adrenals/Urinary Tract: Normal adrenal glands. No hydronephrosis or
renal calculi. No perinephric edema. Tiny low-density renal cortical
lesions are too small to characterize but likely small cysts.
Symmetric excretion on delayed phase imaging. Unremarkable urinary
bladder.

Stomach/Bowel: Decompressed stomach. No small bowel obstruction or
inflammation. Moderate colonic stool burden with colonic tortuosity.
Left colonic diverticulosis. No diverticulitis. Appendix is not well
seen on the current exam. There is no colonic inflammation.

Vascular/Lymphatic: Aortic and branch atherosclerosis. No aneurysm.
Patent portal vein. No portal venous or mesenteric gas. No
abdominopelvic adenopathy.

Reproductive: Status post hysterectomy. No adnexal masses.

Other: No free air, free fluid, or intra-abdominal fluid collection.

Musculoskeletal: Diffuse lumbar degenerative change. There are no
acute or suspicious osseous abnormalities.
IMPRESSION: 1. No acute abnormality in the abdomen/pelvis.
2. Colonic diverticulosis without diverticulitis. Moderate colonic
stool burden with colonic tortuosity, can be seen with constipation.
3. Unchanged 12 mm cyst in the pancreatic head. Recommend follow up
pre and post contrast MRI/MRCP or pancreatic protocol CT in 2 years.
This recommendation follows ACR consensus guidelines: Management of
Incidental Pancreatic Cysts: A White Paper of the ACR Incidental
Findings Committee. [HOSPITAL] 4447;[DATE].
4. Stable splenic cyst.

Aortic Atherosclerosis (8L38E-DTP.P).

## 2023-06-10 ENCOUNTER — Telehealth: Payer: Self-pay | Admitting: Hematology and Oncology

## 2023-06-10 NOTE — Telephone Encounter (Signed)
 Left vm for pt about rescheduled appointment time and date.

## 2023-06-25 ENCOUNTER — Telehealth: Payer: Self-pay | Admitting: Hematology and Oncology

## 2023-06-25 NOTE — Telephone Encounter (Signed)
 Rescheduled [name]'s appointments to make space for a treatment patient.

## 2023-07-09 ENCOUNTER — Other Ambulatory Visit: Payer: Medicare Other

## 2023-07-09 ENCOUNTER — Ambulatory Visit: Payer: Medicare Other | Admitting: Hematology and Oncology

## 2023-08-05 ENCOUNTER — Ambulatory Visit: Admitting: Hematology and Oncology

## 2023-08-05 ENCOUNTER — Other Ambulatory Visit

## 2023-08-06 ENCOUNTER — Telehealth: Payer: Self-pay

## 2023-08-06 NOTE — Telephone Encounter (Signed)
 Left a voicemail to confirm appointments for 5/29

## 2023-08-07 ENCOUNTER — Inpatient Hospital Stay: Admitting: Hematology and Oncology

## 2023-08-07 ENCOUNTER — Inpatient Hospital Stay

## 2023-10-16 ENCOUNTER — Other Ambulatory Visit: Payer: Self-pay | Admitting: Vascular Surgery

## 2023-10-16 DIAGNOSIS — L97509 Non-pressure chronic ulcer of other part of unspecified foot with unspecified severity: Secondary | ICD-10-CM

## 2023-10-21 ENCOUNTER — Other Ambulatory Visit (HOSPITAL_BASED_OUTPATIENT_CLINIC_OR_DEPARTMENT_OTHER): Payer: Self-pay | Admitting: Internal Medicine

## 2023-10-21 DIAGNOSIS — M810 Age-related osteoporosis without current pathological fracture: Secondary | ICD-10-CM

## 2023-10-28 ENCOUNTER — Ambulatory Visit (HOSPITAL_COMMUNITY)

## 2023-10-28 ENCOUNTER — Ambulatory Visit: Admitting: Vascular Surgery
